# Patient Record
Sex: Female | Born: 1955
Health system: Southern US, Community
[De-identification: ages and names within clinical notes are randomized; demographics above are authoritative.]

## PROBLEM LIST (undated history)

## (undated) DIAGNOSIS — I10 Essential (primary) hypertension: Secondary | ICD-10-CM

## (undated) DIAGNOSIS — T7840XA Allergy, unspecified, initial encounter: Secondary | ICD-10-CM

## (undated) DIAGNOSIS — C50919 Malignant neoplasm of unspecified site of unspecified female breast: Secondary | ICD-10-CM

## (undated) DIAGNOSIS — Q185 Microstomia: Secondary | ICD-10-CM

## (undated) DIAGNOSIS — M1990 Unspecified osteoarthritis, unspecified site: Secondary | ICD-10-CM

## (undated) DIAGNOSIS — I776 Arteritis, unspecified: Secondary | ICD-10-CM

## (undated) DIAGNOSIS — K589 Irritable bowel syndrome without diarrhea: Secondary | ICD-10-CM

## (undated) DIAGNOSIS — K219 Gastro-esophageal reflux disease without esophagitis: Secondary | ICD-10-CM

## (undated) DIAGNOSIS — Z8541 Personal history of malignant neoplasm of cervix uteri: Secondary | ICD-10-CM

## (undated) DIAGNOSIS — Z87898 Personal history of other specified conditions: Secondary | ICD-10-CM

## (undated) DIAGNOSIS — Z98811 Dental restoration status: Secondary | ICD-10-CM

## (undated) DIAGNOSIS — J45909 Unspecified asthma, uncomplicated: Secondary | ICD-10-CM

## (undated) DIAGNOSIS — R198 Other specified symptoms and signs involving the digestive system and abdomen: Secondary | ICD-10-CM

## (undated) DIAGNOSIS — F909 Attention-deficit hyperactivity disorder, unspecified type: Secondary | ICD-10-CM

## (undated) DIAGNOSIS — Z5189 Encounter for other specified aftercare: Secondary | ICD-10-CM

## (undated) DIAGNOSIS — R1310 Dysphagia, unspecified: Secondary | ICD-10-CM

## (undated) DIAGNOSIS — F419 Anxiety disorder, unspecified: Secondary | ICD-10-CM

## (undated) DIAGNOSIS — F988 Other specified behavioral and emotional disorders with onset usually occurring in childhood and adolescence: Secondary | ICD-10-CM

## (undated) DIAGNOSIS — Z853 Personal history of malignant neoplasm of breast: Secondary | ICD-10-CM

## (undated) DIAGNOSIS — E039 Hypothyroidism, unspecified: Secondary | ICD-10-CM

## (undated) HISTORY — DX: Malignant neoplasm of unspecified site of unspecified female breast: C50.919

## (undated) HISTORY — DX: Encounter for other specified aftercare: Z51.89

## (undated) HISTORY — PX: CERVICAL CERCLAGE: SHX1329

## (undated) HISTORY — DX: Unspecified asthma, uncomplicated: J45.909

## (undated) HISTORY — PX: MASTECTOMY: SHX3

## (undated) HISTORY — PX: VEIN SURGERY: SHX48

## (undated) HISTORY — PX: BRAIN SURGERY: SHX531

## (undated) HISTORY — DX: Allergy, unspecified, initial encounter: T78.40XA

## (undated) HISTORY — PX: FACIAL COSMETIC SURGERY: SHX629

## (undated) HISTORY — DX: Arteritis, unspecified: I77.6

## (undated) HISTORY — PX: ABDOMINAL HYSTERECTOMY: SHX81

## (undated) HISTORY — PX: BREAST ENHANCEMENT SURGERY: SHX7

---

## 1996-12-23 HISTORY — PX: GYNECOLOGIC CRYOSURGERY: SHX857

## 1998-04-28 ENCOUNTER — Ambulatory Visit (HOSPITAL_COMMUNITY): Admission: RE | Admit: 1998-04-28 | Discharge: 1998-04-28 | Payer: Self-pay | Admitting: Obstetrics & Gynecology

## 1999-03-16 ENCOUNTER — Ambulatory Visit (HOSPITAL_COMMUNITY): Admission: RE | Admit: 1999-03-16 | Discharge: 1999-03-16 | Payer: Self-pay | Admitting: Obstetrics & Gynecology

## 1999-04-09 ENCOUNTER — Encounter: Admission: RE | Admit: 1999-04-09 | Discharge: 1999-05-01 | Payer: Self-pay | Admitting: Orthopedic Surgery

## 2000-03-04 ENCOUNTER — Encounter: Admission: RE | Admit: 2000-03-04 | Discharge: 2000-03-04 | Payer: Self-pay | Admitting: Family Medicine

## 2000-03-04 ENCOUNTER — Encounter: Payer: Self-pay | Admitting: Family Medicine

## 2000-03-18 ENCOUNTER — Encounter: Payer: Self-pay | Admitting: Obstetrics & Gynecology

## 2000-03-18 ENCOUNTER — Ambulatory Visit (HOSPITAL_COMMUNITY): Admission: RE | Admit: 2000-03-18 | Discharge: 2000-03-18 | Payer: Self-pay | Admitting: Obstetrics & Gynecology

## 2000-04-17 ENCOUNTER — Encounter: Admission: RE | Admit: 2000-04-17 | Discharge: 2000-04-17 | Payer: Self-pay | Admitting: Neurology

## 2000-04-17 ENCOUNTER — Encounter: Payer: Self-pay | Admitting: Neurology

## 2000-04-21 ENCOUNTER — Other Ambulatory Visit: Admission: RE | Admit: 2000-04-21 | Discharge: 2000-04-21 | Payer: Self-pay | Admitting: Obstetrics & Gynecology

## 2000-09-10 ENCOUNTER — Encounter: Payer: Self-pay | Admitting: Internal Medicine

## 2000-09-10 ENCOUNTER — Encounter: Admission: RE | Admit: 2000-09-10 | Discharge: 2000-09-10 | Payer: Self-pay | Admitting: Internal Medicine

## 2000-09-25 ENCOUNTER — Emergency Department (HOSPITAL_COMMUNITY): Admission: EM | Admit: 2000-09-25 | Discharge: 2000-09-25 | Payer: Self-pay | Admitting: Emergency Medicine

## 2000-09-26 ENCOUNTER — Encounter: Payer: Self-pay | Admitting: Internal Medicine

## 2000-09-26 ENCOUNTER — Encounter: Admission: RE | Admit: 2000-09-26 | Discharge: 2000-09-26 | Payer: Self-pay | Admitting: Internal Medicine

## 2001-03-23 ENCOUNTER — Ambulatory Visit (HOSPITAL_COMMUNITY): Admission: RE | Admit: 2001-03-23 | Discharge: 2001-03-23 | Payer: Self-pay | Admitting: Obstetrics & Gynecology

## 2001-03-23 ENCOUNTER — Encounter: Payer: Self-pay | Admitting: Obstetrics & Gynecology

## 2001-05-20 ENCOUNTER — Other Ambulatory Visit: Admission: RE | Admit: 2001-05-20 | Discharge: 2001-05-20 | Payer: Self-pay | Admitting: Obstetrics & Gynecology

## 2002-04-12 ENCOUNTER — Encounter: Payer: Self-pay | Admitting: Obstetrics & Gynecology

## 2002-04-12 ENCOUNTER — Ambulatory Visit (HOSPITAL_COMMUNITY): Admission: RE | Admit: 2002-04-12 | Discharge: 2002-04-12 | Payer: Self-pay | Admitting: Obstetrics & Gynecology

## 2002-06-14 ENCOUNTER — Other Ambulatory Visit: Admission: RE | Admit: 2002-06-14 | Discharge: 2002-06-14 | Payer: Self-pay | Admitting: Obstetrics & Gynecology

## 2002-06-22 ENCOUNTER — Encounter: Admission: RE | Admit: 2002-06-22 | Discharge: 2002-06-22 | Payer: Self-pay | Admitting: Gastroenterology

## 2002-06-22 ENCOUNTER — Encounter: Payer: Self-pay | Admitting: Gastroenterology

## 2002-07-08 ENCOUNTER — Ambulatory Visit (HOSPITAL_COMMUNITY): Admission: RE | Admit: 2002-07-08 | Discharge: 2002-07-08 | Payer: Self-pay | Admitting: Internal Medicine

## 2002-07-08 ENCOUNTER — Encounter: Payer: Self-pay | Admitting: Internal Medicine

## 2002-12-30 ENCOUNTER — Encounter: Admission: RE | Admit: 2002-12-30 | Discharge: 2002-12-30 | Payer: Self-pay | Admitting: Family Medicine

## 2002-12-30 ENCOUNTER — Encounter: Payer: Self-pay | Admitting: Obstetrics & Gynecology

## 2003-05-12 ENCOUNTER — Encounter (INDEPENDENT_AMBULATORY_CARE_PROVIDER_SITE_OTHER): Payer: Self-pay | Admitting: Specialist

## 2003-05-12 ENCOUNTER — Ambulatory Visit (HOSPITAL_COMMUNITY): Admission: RE | Admit: 2003-05-12 | Discharge: 2003-05-12 | Payer: Self-pay | Admitting: Obstetrics & Gynecology

## 2003-05-12 ENCOUNTER — Encounter (INDEPENDENT_AMBULATORY_CARE_PROVIDER_SITE_OTHER): Payer: Self-pay

## 2003-05-12 HISTORY — PX: ENDOMETRIAL FULGURATION: SHX1500

## 2003-05-12 HISTORY — PX: DIAGNOSTIC LAPAROSCOPY: SUR761

## 2003-05-12 HISTORY — PX: RADIOFREQUENCY ABLATION NERVES: SUR1070

## 2003-08-01 ENCOUNTER — Other Ambulatory Visit: Admission: RE | Admit: 2003-08-01 | Discharge: 2003-08-01 | Payer: Self-pay | Admitting: Obstetrics & Gynecology

## 2003-10-07 ENCOUNTER — Ambulatory Visit: Admission: RE | Admit: 2003-10-07 | Discharge: 2003-10-07 | Payer: Self-pay | Admitting: Internal Medicine

## 2003-10-07 ENCOUNTER — Encounter: Payer: Self-pay | Admitting: Internal Medicine

## 2003-10-27 ENCOUNTER — Encounter: Admission: RE | Admit: 2003-10-27 | Discharge: 2003-10-27 | Payer: Self-pay | Admitting: Chiropractic Medicine

## 2003-11-01 ENCOUNTER — Other Ambulatory Visit: Admission: RE | Admit: 2003-11-01 | Discharge: 2003-11-01 | Payer: Self-pay | Admitting: Obstetrics & Gynecology

## 2003-12-24 HISTORY — PX: DILATION AND CURETTAGE OF UTERUS: SHX78

## 2004-02-14 ENCOUNTER — Ambulatory Visit (HOSPITAL_COMMUNITY): Admission: RE | Admit: 2004-02-14 | Discharge: 2004-02-14 | Payer: Self-pay | Admitting: Obstetrics & Gynecology

## 2004-03-28 ENCOUNTER — Encounter (INDEPENDENT_AMBULATORY_CARE_PROVIDER_SITE_OTHER): Payer: Self-pay | Admitting: Specialist

## 2004-03-28 ENCOUNTER — Inpatient Hospital Stay (HOSPITAL_COMMUNITY): Admission: RE | Admit: 2004-03-28 | Discharge: 2004-03-31 | Payer: Self-pay | Admitting: Obstetrics & Gynecology

## 2004-03-28 HISTORY — PX: LYSIS OF ADHESION: SHX5961

## 2004-03-28 HISTORY — PX: TOTAL ABDOMINAL HYSTERECTOMY W/ BILATERAL SALPINGOOPHORECTOMY: SHX83

## 2004-03-28 HISTORY — PX: LIPECTOMY: SHX11

## 2004-03-28 HISTORY — PX: ABDOMINOPLASTY: SUR9

## 2004-05-17 ENCOUNTER — Ambulatory Visit (HOSPITAL_COMMUNITY): Admission: RE | Admit: 2004-05-17 | Discharge: 2004-05-17 | Payer: Self-pay | Admitting: Obstetrics & Gynecology

## 2004-12-16 ENCOUNTER — Emergency Department (HOSPITAL_COMMUNITY): Admission: EM | Admit: 2004-12-16 | Discharge: 2004-12-16 | Payer: Self-pay | Admitting: Emergency Medicine

## 2004-12-28 ENCOUNTER — Ambulatory Visit: Payer: Self-pay | Admitting: Gastroenterology

## 2005-04-22 ENCOUNTER — Other Ambulatory Visit: Admission: RE | Admit: 2005-04-22 | Discharge: 2005-04-22 | Payer: Self-pay | Admitting: Obstetrics & Gynecology

## 2005-05-14 ENCOUNTER — Encounter: Admission: RE | Admit: 2005-05-14 | Discharge: 2005-05-14 | Payer: Self-pay | Admitting: Internal Medicine

## 2005-05-17 ENCOUNTER — Ambulatory Visit (HOSPITAL_COMMUNITY): Admission: RE | Admit: 2005-05-17 | Discharge: 2005-05-17 | Payer: Self-pay | Admitting: Obstetrics & Gynecology

## 2005-05-21 ENCOUNTER — Encounter: Admission: RE | Admit: 2005-05-21 | Discharge: 2005-05-21 | Payer: Self-pay | Admitting: Obstetrics & Gynecology

## 2005-09-19 ENCOUNTER — Encounter: Admission: RE | Admit: 2005-09-19 | Discharge: 2005-09-19 | Payer: Self-pay | Admitting: Interventional Radiology

## 2005-09-26 ENCOUNTER — Encounter: Admission: RE | Admit: 2005-09-26 | Discharge: 2005-09-26 | Payer: Self-pay | Admitting: Internal Medicine

## 2005-10-17 ENCOUNTER — Encounter: Admission: RE | Admit: 2005-10-17 | Discharge: 2005-10-17 | Payer: Self-pay | Admitting: Internal Medicine

## 2005-10-31 ENCOUNTER — Encounter: Admission: RE | Admit: 2005-10-31 | Discharge: 2005-10-31 | Payer: Self-pay | Admitting: Internal Medicine

## 2005-11-13 ENCOUNTER — Encounter: Admission: RE | Admit: 2005-11-13 | Discharge: 2005-11-13 | Payer: Self-pay | Admitting: Internal Medicine

## 2005-11-21 ENCOUNTER — Encounter: Admission: RE | Admit: 2005-11-21 | Discharge: 2005-11-21 | Payer: Self-pay | Admitting: Specialist

## 2005-11-28 ENCOUNTER — Encounter: Admission: RE | Admit: 2005-11-28 | Discharge: 2005-11-28 | Payer: Self-pay | Admitting: Internal Medicine

## 2006-01-28 ENCOUNTER — Encounter: Admission: RE | Admit: 2006-01-28 | Discharge: 2006-01-28 | Payer: Self-pay | Admitting: Internal Medicine

## 2006-03-10 ENCOUNTER — Encounter (INDEPENDENT_AMBULATORY_CARE_PROVIDER_SITE_OTHER): Payer: Self-pay | Admitting: *Deleted

## 2006-03-10 ENCOUNTER — Ambulatory Visit (HOSPITAL_COMMUNITY): Admission: RE | Admit: 2006-03-10 | Discharge: 2006-03-10 | Payer: Self-pay | Admitting: Gastroenterology

## 2006-03-11 ENCOUNTER — Encounter: Admission: RE | Admit: 2006-03-11 | Discharge: 2006-03-11 | Payer: Self-pay | Admitting: Internal Medicine

## 2006-05-10 ENCOUNTER — Emergency Department (HOSPITAL_COMMUNITY): Admission: EM | Admit: 2006-05-10 | Discharge: 2006-05-10 | Payer: Self-pay | Admitting: Family Medicine

## 2006-10-15 ENCOUNTER — Encounter: Admission: RE | Admit: 2006-10-15 | Discharge: 2006-10-15 | Payer: Self-pay | Admitting: Obstetrics & Gynecology

## 2007-10-19 ENCOUNTER — Encounter: Admission: RE | Admit: 2007-10-19 | Discharge: 2007-10-19 | Payer: Self-pay | Admitting: Obstetrics & Gynecology

## 2007-11-01 ENCOUNTER — Encounter: Admission: RE | Admit: 2007-11-01 | Discharge: 2007-11-01 | Payer: Self-pay | Admitting: Obstetrics & Gynecology

## 2007-11-15 ENCOUNTER — Encounter: Admission: RE | Admit: 2007-11-15 | Discharge: 2007-11-15 | Payer: Self-pay | Admitting: Obstetrics & Gynecology

## 2008-03-04 ENCOUNTER — Ambulatory Visit: Payer: Self-pay | Admitting: Infectious Diseases

## 2008-03-22 ENCOUNTER — Emergency Department (HOSPITAL_COMMUNITY): Admission: EM | Admit: 2008-03-22 | Discharge: 2008-03-22 | Payer: Self-pay | Admitting: Family Medicine

## 2008-10-19 ENCOUNTER — Encounter: Admission: RE | Admit: 2008-10-19 | Discharge: 2008-10-19 | Payer: Self-pay | Admitting: Obstetrics & Gynecology

## 2008-12-19 ENCOUNTER — Encounter: Admission: RE | Admit: 2008-12-19 | Discharge: 2008-12-19 | Payer: Self-pay | Admitting: Gastroenterology

## 2009-04-27 ENCOUNTER — Emergency Department (HOSPITAL_COMMUNITY): Admission: EM | Admit: 2009-04-27 | Discharge: 2009-04-27 | Payer: Self-pay | Admitting: Emergency Medicine

## 2009-07-15 ENCOUNTER — Emergency Department (HOSPITAL_COMMUNITY): Admission: EM | Admit: 2009-07-15 | Discharge: 2009-07-15 | Payer: Self-pay | Admitting: Family Medicine

## 2009-09-06 DIAGNOSIS — Z87898 Personal history of other specified conditions: Secondary | ICD-10-CM | POA: Insufficient documentation

## 2009-09-06 DIAGNOSIS — I868 Varicose veins of other specified sites: Secondary | ICD-10-CM | POA: Insufficient documentation

## 2009-09-06 DIAGNOSIS — C539 Malignant neoplasm of cervix uteri, unspecified: Secondary | ICD-10-CM | POA: Insufficient documentation

## 2009-09-06 DIAGNOSIS — I8000 Phlebitis and thrombophlebitis of superficial vessels of unspecified lower extremity: Secondary | ICD-10-CM | POA: Insufficient documentation

## 2009-10-03 ENCOUNTER — Emergency Department (HOSPITAL_COMMUNITY): Admission: EM | Admit: 2009-10-03 | Discharge: 2009-10-03 | Payer: Self-pay | Admitting: Family Medicine

## 2009-10-20 ENCOUNTER — Encounter: Admission: RE | Admit: 2009-10-20 | Discharge: 2009-10-20 | Payer: Self-pay | Admitting: Obstetrics & Gynecology

## 2009-11-14 ENCOUNTER — Encounter: Admission: RE | Admit: 2009-11-14 | Discharge: 2009-11-14 | Payer: Self-pay | Admitting: Family Medicine

## 2010-08-07 ENCOUNTER — Emergency Department (HOSPITAL_COMMUNITY): Admission: EM | Admit: 2010-08-07 | Discharge: 2010-08-07 | Payer: Self-pay | Admitting: Family Medicine

## 2010-10-30 ENCOUNTER — Ambulatory Visit (HOSPITAL_COMMUNITY): Admission: RE | Admit: 2010-10-30 | Discharge: 2010-10-30 | Payer: Self-pay | Admitting: Gastroenterology

## 2010-11-07 ENCOUNTER — Encounter: Admission: RE | Admit: 2010-11-07 | Discharge: 2010-11-07 | Payer: Self-pay | Admitting: Obstetrics & Gynecology

## 2011-01-12 ENCOUNTER — Encounter: Payer: Self-pay | Admitting: Internal Medicine

## 2011-05-10 NOTE — H&P (Signed)
NAME:  Jane Gutierrez, PADIN                             ACCOUNT NO.:  [id number]   MEDICAL RECORD NO.:  [id number]                   PATIENT TYPE:  INP   LOCATION:  NA                                   FACILITY:  [facility]   PHYSICIAN:  Freddy Finner, M.D.                DATE OF BIRTH:  [birth year]   DATE OF ADMISSION:  03/28/2004  DATE OF DISCHARGE:                                HISTORY & PHYSICAL   ADMISSION DIAGNOSES:  1. Uterine enlargement consistent clinically with adenomyosis.  2. Known previous pelvic endometriosis.  3. Known pelvic adhesions.  4. Recent chronic history of dysfunctional uterine bleeding unresponsive to     Yasmin, which is an oral contraceptive.   The patient is a 55 year old white divorced female, gravida 2, para 1, who  has significant previous GYN and obstetrical history.  She had one viable 37-  week pregnancy which she delivered.  She had a second pregnancy loss in the  second trimester due to incompetent cervix.  She has previously had  laparoscopy on at least three different occasions for pelvic pain with  findings of pelvic endometriosis, adhesions, uterine enlargement.  She has  also had history of cervical dysplasia with the worst lesion being CIS.  Her  recent Pap smear does show some atypical glandular cells but otherwise is  normal.  She has previously had surgical sterilization.  She has requested  definitive surgical intervention at this time and is admitted now for total  abdominal hysterectomy.   Her current review of systems is otherwise negative.  There are no  cardiopulmonary, GI, or GU complaints.   PAST MEDICAL HISTORY:  The patient is known to have had superficial  phlebitis.  She has had previous vein stripping for varicosities.  She has  additional problems as noted above.  She is known to have hypothyroidism,  for which she takes Synthroid 100 mcg a day.  She takes one-half of a 2 mg  Bumex per day.  She takes a daily aspirin.   She has  significant known allergies to IODINE, BETADINE, PENICILLIN,  AMOXICILLIN, CLEOCIN, and SEPTRA.   She has previously had surgical procedures as noted above.  She has also had  a previous breast biopsy for benign breast disease.  She has never had a  blood transfusion.   She is not a cigarette smoker.   Family history is noncontributory.   PHYSICAL EXAMINATION:  VITAL SIGNS:  Blood pressure in the office is 92/60.  Weight 166, height 5 feet 6 inches.  HEENT:  Grossly within normal limits.  NECK:  Thyroid gland is not palpably enlarged to my examination.  CHEST:  Clear to auscultation throughout.  CARDIAC:  Normal sinus rhythm without murmurs, rubs, or gallops.  BREASTS:  Breast exam is considered to be normal at this time, no palpable  masses, no nipple discharge, no skin change.  ABDOMEN:  Soft, nontender, without appreciable organomegaly or palpable  masses.  PELVIC:  External genitalia, vagina, and cervix are normal.  Bimanual  reveals the uterus to be anterior in position, slightly increased in size,  and mildly tender.  There are no palpable adnexal masses.  The rectum is  palpably normal.  Rectovaginal exam confirms the above findings.   ASSESSMENT:  1. Probable adenomyosis.  2. Known previous pelvic endometriosis and pelvic adhesions.  3. Chronic clinical problems of dysmenorrhea, dysfunctional uterine     bleeding.   PLAN:  Total abdominal hysterectomy.  The procedure will be followed by an  abdominoplasty with W. Delia Chimes, M.D.  The patient will receive IV  antibiotic preoperatively.  She will be placed in PAS compression hose.                                               Freddy Finner, M.D.    WRN/MEDQ  D:  03/27/2004  T:  03/28/2004  Job:  [id number]

## 2011-05-10 NOTE — Op Note (Signed)
[name], Jane Gutierrez                   ACCOUNT NO.:  [id number]   MEDICAL RECORD NO.:  [id number]          PATIENT TYPE:  AMB   LOCATION:  ENDO                         FACILITY:  [facility]   PHYSICIAN:  Anselmo Rod, M.D.  DATE OF BIRTH:  08-28-1956   DATE OF PROCEDURE:  03/12/2006  DATE OF DISCHARGE:  03/10/2006                                 OPERATIVE REPORT   PROCEDURE PERFORMED:  Colonoscopy with cold biopsies.   ENDOSCOPIST:  Anselmo Rod, M.D.   INSTRUMENT USED:  Olympus video colonoscope.   INDICATIONS FOR PROCEDURE:  The patient is a 55 year old white female with a  personal history of cervical cancer and change in bowel habits undergoing  colonoscopy. Rule out colonic polyps, masses, etc. Random colon biopsy is  planned to rule out collagenous colitis.   PREPROCEDURE PREPARATION:  Informed consent was procured from the patient.  The patient was fasted for four hours prior to the procedure and prepped  with OsmoPrep pills the night of and the morning of the procedure.  The  risks and benefits of the procedure including a 10% miss rate for cancer or  polyps was discussed with the patient as well.   PREPROCEDURE PHYSICAL:  The patient had stable vital signs.  Neck supple.  Chest clear to auscultation.  S1 and S2 regular.  Abdomen soft with normal  bowel sounds.   DESCRIPTION OF PROCEDURE:  The patient was placed in left lateral decubitus  position and sedated with 100 mcg of fentanyl and 10 mg of Versed in slow  incremental doses.  Once the patient was adequately sedated and maintained  on low flow oxygen and continuous cardiac monitoring, the Olympus video  colonoscope was advanced from the rectum to the cecum.  A small sessile  polyp was biopsied from the cecum.  A small external hemorrhoid was seen on  inspection with internal hemorrhoids seen on retroflexion.  Random colon  biopsies were done to rule out collagenous versus microscopic colitis.  There was no evidence  of diverticulosis.  The patient tolerated the  procedure well without immediate complication.   IMPRESSION:  1.  Small external hemorrhoid.  2.  Small internal hemorrhoids.  3.  Small sessile polyp removed by cold biopsy from the cecum.  4.  Random colon biopsies done to evaluate for collagenous versus      microscopic colitis.  5.  Question of visceral hypersensitivity.  Patient experienced significant      abdominal pain with insufflation of air into the colon indicating a      component of irritable bowel syndrome.   RECOMMENDATIONS:  1.  Await pathology results.  2.  Continue high fiber diet with liberal fluid intake and use hyoscyamine      as needed.  3.  Outpatient followup as need arises in the future.  4.  Repeat colonoscopy depending on pathology results.      Anselmo Rod, M.D.  Electronically Signed     JNM/MEDQ  D:  03/12/2006  T:  03/13/2006  Job:  [id number]   cc:   W.  Varney Baas, M.D.  Fax: [phone]   Lucky Cowboy, M.D.  Fax: [phone]

## 2011-05-10 NOTE — Op Note (Signed)
NAME:  Jane Gutierrez, Jane Gutierrez                             ACCOUNT NO.:  [id number]   MEDICAL RECORD NO.:  [id number]                   PATIENT TYPE:  INP   LOCATION:  9316                                 FACILITY:  [facility]   PHYSICIAN:  Freddy Finner, M.D.                DATE OF BIRTH:  17-Dec-1956   DATE OF PROCEDURE:  03/28/2004  DATE OF DISCHARGE:                                 OPERATIVE REPORT   PREOPERATIVE DIAGNOSES:  Suspected adenomyosis, uterine enlargement, history  of chronic pelvic pain, history of pelvic endometriosis, history of pelvic  adhesions.   POSTOPERATIVE DIAGNOSES:  Suspected adenomyosis, uterine enlargement,  history of chronic pelvic pain, history of pelvic endometriosis, history of  pelvic adhesions with no active evidence of endometriosis with adhesions of  the left adnexa.   OPERATIVE PROCEDURE:  Total abdominal hysterectomy, lysis of pelvic  adhesions, bilateral salpingo-oophorectomy.   SURGEON:  Freddy Finner, M.D.   ASSISTANT:  Juluis Mire, M.D.   SECONDARY PROCEDURE PERFORMED AFTER COMPLETION OF HYSTERECTOMY:  Abdominoplasty and liposuction per Dr. Benna Dunks.   ANESTHESIA:  General endotracheal.   ESTIMATED INTRAOPERATIVE BLOOD LOSS DURING THE GYN PROCEDURE:  100 cc.   INTRAOPERATIVE COMPLICATIONS:  None.   The patient was admitted on the morning of surgery. She was placed in  _________ compression hose. She was given a gram of Mefoxin preoperatively.  She was taken to the operating room, placed under adequate general  endotracheal anesthesia. The abdomen, perineum, and vagina were prepped in a  standard fashion for both procedures to be performed using Hibiclens. The  patient is allergic to BETADINE.  A Foley catheter was placed using sterile  technique. A vaginal prep was carried out with Hibiclens. Sterile drapes  were applied. A low abdominal transverse incision was made through a marked  line in the skin placed by Dr. Benna Dunks. The incision was  carried sharply down  through fascia. Bleeding  subcutaneous  fascia was controlled with the  Bovie. The fascia was entered sharply and extended transversely to the  extent of skin incisions. A rectus sheath was developed superiorly and  inferiorly using blunt and sharp dissection. The rectus muscle was divided  in the midline. Peritoneum was entered bluntly and extended bluntly sharply  to the extent of skin incision. Palpation of the upper abdomen revealed no  apparent abnormality of the liver, gallbladder, and kidneys. The appendix  was visualized and was normal.  A self-retaining Andrey Spearman retractor  was placed. Moist packs were used to pack the intestinal contents out of the  pelvis. Proximal broad ligaments were clamped with Kelly clamps. Round  ligaments were taken with suture ligature of 0 Monocryl on each side and  this was tied and the ligament divided.  Infundibulopelvic ligament was  developed on each side, cross clamped with curved Heaney, divided, and  doubly ligated with free tie of 0 Monocryl,  followed by suture ligature of 0  Monocryl. Vessel pedicles were skeletonized and the peritoneum was incised  transversely to release the bladder from the cervix and the lower uterine  segment. Vessel pedicles were taken with curved Heaney's, divided sharply,  and ligated with 0 Monocryl. Straight Heaneys were placed in the cardinal  ligament. Pedicles were divided sharply and ligated with 0 Monocryl in a  Heaney fashion. Curved Heaneys were placed in the uterosacral pedicles. This  included the angle of the vagina on the left. The pedicles were sharply  divided, suture ligated with 0 Vicryl in a Heaney fashion. The angle of the  vagina was tucked in on the right after securing the uterosacral ligament.  This was anchored to the uterosacral. The cervix was completely excised and  inspection revealed adequate removal of the cervix. The remaining vaginal  cuff opening was small  and this was closed with a single finger-of-eight of  0 Vicryl. A small amount of bleeding sources in the cuff were controlled  with the Bovie. Uterosacrals were plicated with 0 Monocryl in the peritoneum  anterior to the cuff and was pulled over the cuff using the same suture.  Irrigation was carried out. Hemostasis was complete. All packs, needles, and  instruments were removed. Counts were correct. The peritoneum was then  closed with a running 0 Monocryl suture. The fascia was closed with running  0 PDS running from angle to angle on either side. The closure was then  stopped in anticipation of Dr. Derek Jack procedure. The procedure was turned  over to him and he will dictate a further operative note.                                               Freddy Finner, M.D.    WRN/MEDQ  D:  03/28/2004  T:  03/29/2004  Job:  [id number]

## 2011-05-10 NOTE — Op Note (Signed)
NAME:  Jane Gutierrez, Jane Gutierrez                             ACCOUNT NO.:  [id number]   MEDICAL RECORD NO.:  [id number]                   PATIENT TYPE:  INP   LOCATION:  9316                                 FACILITY:  [facility]   PHYSICIAN:  Alfredia Ferguson, M.D.               DATE OF BIRTH:  1956/03/03   DATE OF PROCEDURE:  03/28/2004  DATE OF DISCHARGE:                                 OPERATIVE REPORT   PREOPERATIVE DIAGNOSIS:  Excess skin of lower abdomen with excess skin in  bilateral hip area.   POSTOPERATIVE DIAGNOSIS:  Excess skin of lower abdomen with excess skin in  bilateral hip area.   OPERATION PERFORMED:  1. Abdominoplasty.  2. Suction assisted lipectomy, bilateral hips.   SURGEON:  Alfredia Ferguson, M.D.   FIRST ASSISTANT:  Vevelyn Francois, RNFA   ANESTHESIA:  General endotracheal anesthesia.   INDICATIONS FOR PROCEDURE:  This is a 55 year old woman who is undergoing  elective hysterectomy.  She wishes to have an abdominoplasty at the same  time.  She has some prominent fat in the hip area.  She would like to have  that area reduced in size using liposuction.  The potential risks of the  surgery were discussed in detail with the patient including the risks of  over resection of skin, under resection of skin, hematoma, seroma,  infection, unsightly scarring, vascular compromise to the umbilicus,  asymmetry of the scar, prolonged drainage, and overall dissatisfaction of  the results.  In addition, the risks of liposuction including asymmetry,  rippling, dimpling, wrinkling of the skin, hematoma, seroma, prolonged  edema, and the need for secondary liposuction was discussed.  In spite of  these risks, the patient wishes to proceed with the surgery.   DESCRIPTION OF PROCEDURE:  On the day prior to surgery, skin marks were  placed outlining the dimensions of the skin excision for the tummy tuck and  also the areas of liposuction in the hip area.  Following completion of the  hysterectomy, I was summoned to the operating room.  Attention was first  directed to the lower abdomen where the hysterectomy incision was lengthened  from one side of the abdomen to the other.  A circular incision was made  around the umbilicus and the umbilical stalk was dissected away from the  skin and subcutaneous tissue of the abdominal flap leaving a healthy cuff of  fat to insure vascular integrity.  The abdominal flap was elevated to the  umbilical stalk and then split in the midline to facilitate dissection.  The  flap was dissected up to the costal margins bilaterally and the xiphoid in  the midline.  Hemostasis was meticulously maintained.  The anterior rectus  fascia was plicated beginning in the subxiphoid area using interrupted 0  Prolene figure-of-eight suturing.  The plication was carried out from the  xiphoid to the suprapubic area with care taken  in the periumbilical area not  to strangulate the umbilical stalk.  The abdominal wound was copiously  irrigated with saline irrigation.  The 10 mm Blake drain was placed in the  wound and brought out through a separate stab incision.   I now infiltrated tumescent solution in both hip areas.  The tumescent  solution consisted of 1 liter of ringers lactate plus 1 ampule of 1:100,000  epinephrine plus 20 mL of 1% Xylocaine plain.  Approximately 500 mL was  infiltrated on each side.  While waiting for the tumescent solution to set  up, the patient's back was elevated to 30 degrees and the knees were flexed.  The excess skin was pulled in an inferior direction and marked.  The excess  skin was excised with care taken to bring about meticulous hemostasis.  The  midline of the wound was closed using interrupted 2-0 Vicryl sutures.  The  remainder of the incision was closed alternating 2-0 Vicryl sutures for the  dermis and 3-0 Monocryl sutures for the dermis.  A running subcuticular 3-0  Monocryl was then placed.  A new opening for  the umbilicus was previously  marked and now incised.  The umbilicus was brought through this new opening  and fixed in the new position using multiple interrupted 4-0 Vicryl sutures.  Liposuction was now carried out using a 4 mm cannula using the power  assisted liposuction machine.  Liposuction was also carried out through a  separate stab incision in order to use the cannula in varying directions.  Approximately 175 grams were removed from each hip area.  There was almost  no bleeding.  Symmetry was good at the conclusion of the procedure.  Each  access site was closed with an interrupted 5-0 nylon suture.  The patient  tolerated the procedure well with an estimated blood loss of less than 100  mL.  The patient's chest and abdomen were cleansed, dried, and Steri-Strips  were applied to the incision.  A bulky dressing was placed over the  abdominal incision followed by an abdominal binder.  The patient was  awakened, extubated, and transported to the recovery room in satisfactory  condition.                                               Alfredia Ferguson, M.D.    WBB/MEDQ  D:  03/28/2004  T:  03/28/2004  Job:  [id number]

## 2011-05-10 NOTE — Op Note (Signed)
NAME:  Jane Gutierrez, Jane Gutierrez                             ACCOUNT NO.:  [id number]   MEDICAL RECORD NO.:  [id number]                   PATIENT TYPE:  AMB   LOCATION:  SDC                                  FACILITY:  [facility]   PHYSICIAN:  Freddy Finner, M.D.                DATE OF BIRTH:  August 28, 1956   DATE OF PROCEDURE:  05/12/2003  DATE OF DISCHARGE:                                 OPERATIVE REPORT   PREOPERATIVE DIAGNOSES:  1. Chronic recurrent pelvic pain.  2. Known history of pelvic endometriosis.  3. Known history of uterine enlargement.   POSTOPERATIVE DIAGNOSES:  1. Recurrent pelvic endometriosis.  2. Pelvic adhesions.  3. Hydrosalpinx of tubal remnant on the right.  4. Boggy enlargement of the uterus most consistent with adenomyosis.   OPERATIVE PROCEDURES:  1. Laparoscopy.  2. Fulguration of recurrent evidence of endometriosis.  3. Uterosacral nerve ablation.  4. Partial salpingectomy of right fallopian tube.  5. Lysis of paraovarian adhesions on the left.  6. Aspiration of cyst fluid of left ovarian cyst.   ANESTHESIA:  General.   INTRAOPERATIVE COMPLICATIONS:  None.   ESTIMATED INTRAOPERATIVE BLOOD LOSS:  5 mL.   Details of the present illness are recorded in the admission note.  The  patient is a 55 year old known to have pelvic endometriosis with diagnosis  in 1999.  She is now having recurrent symptoms consistent with  endometriosis.   The findings are as noted in the postoperative diagnoses.  The findings are  recorded in still photographs retained in the office record.   DESCRIPTION OF PROCEDURE:  The patient was admitted on the morning of  surgery, brought to the operating room, placed under adequate general  anesthesia, and placed in the dorsolithotomy position using the Linden  stirrup system.  Hibiclens was used for the prep because of iodine allergy.  This was carried out in the usual fashion.  Blood was evacuated with a  Robinson catheter.  The Hulka tenaculum  was attached to the cervix under  direct visualization.  Sterile drapes were applied.  Two small incisions  were made, one through an old scar at the umbilicus and one just above the  symphysis.  An 11 mm nonbladed disposable trocar was introduced at the  umbilicus.  Direction inspection revealed an adequate placement with no  evidence of injury on entry.  Pneumoperitoneum was allowed to accumulate.  A  5 mm trocar was placed in the lower incision through the blunt probe.  Later, grasping forceps and Nezhat irrigation system were used through this  port.  Systematic examination of pelvic and abdominal contents was carried  out and photographs made.  Using the bipolar forceps through the operating  channel of the laparoscope, all visible evidence of pelvic endometriosis was  ablated, including lesions on the left ovary.  Blunt dissection was required  to free the left ovary  from the left pelvic sidewall.  It was cystically  enlarged.  For that reason, a needle trocar was introduced.  Aspiration  contained dark bloody material possibly consistent with endometrioma versus  hemorrhage cyst.  The fluid was submitted for examination.  The mid portion  of the right fallopian tube was cystically dilated with apparent occlusion  at both ends of the tube with minimal fimbria remaining distal to this  dilated segment.  The dilated segment was dissected free after fulguration  with the bipolar forceps and retrieved through the operating channel of the  laparoscope.  Uterosacral ligaments were fulgurated at their junction with  the cervix using bipolar forceps.  A pseudowindow in the peritoneal cul-de-  sac on the right was fulgurated along its entire circumference.  Scattered  lesions, one on the uterus goes along the peritoneal underlying the left  ovary, those on the inferior surface of the left ovary, and a lesion on the  anterior bladder fold, were fulgurated with bipolar forceps.  Some of these   were clearly active lesions and some possibly char from previous procedure,  but were treated anyway.  With adequate hemostasis, the procedure was  terminated after irrigated was carried out.  All irrigating solution was  aspirated from the abdomen.  The instruments were removed after gas was  allowed to escape.  Skin incisions were closed with interrupted subcutaneous  sutures of 3-0 Dexon.  Plain Marcaine 0.25% was injected into the incision  sites for postoperative analgesia.  Steri-Strips were applied in the lower  incision.                                               Freddy Finner, M.D.    WRN/MEDQ  D:  05/12/2003  T:  05/12/2003  Job:  [id number]

## 2011-05-10 NOTE — Discharge Summary (Signed)
Jane Gutierrez, Jane Gutierrez                             ACCOUNT NO.:  [id number]   MEDICAL RECORD NO.:  [id number]                   PATIENT TYPE:  INP   LOCATION:  9316                                 FACILITY:  [facility]   PHYSICIAN:  Freddy Finner, M.D.                DATE OF BIRTH:  April 08, 1956   DATE OF ADMISSION:  03/28/2004  DATE OF DISCHARGE:  03/31/2004                                 DISCHARGE SUMMARY   DISCHARGE DIAGNOSES:  1. Uterine enlargement consistent with adenomyosis, confirmed     histologically.  2. Known pelvic endometriosis.  3. Known pelvic adhesions.  4. Chronic history of dysfunction bleeding unresponsive to oral     contraceptives.  5. Plastic surgery diagnosis of excessive skin over lower abdomen and     excessive skin in bilateral hip area.   OPERATIVE PROCEDURE:  Total abdominal hysterectomy, bilateral salpingo-  oophorectomy, and lysis of pelvic adhesions, followed by abdominoplasty and  suction-assisted lipectomy, bilateral hips.   POSTOPERATIVE COMPLICATIONS:  Fever elevation.   OTHER POSTOPERATIVE COMPLICATIONS:  None.   Details of the present illness are recorded in the admission note and in the  operative summary.   Physical findings on admission were remarkable for the above abdominal and  hip findings per Dr. Benna Dunks and for the enlargement of the uterus and  certainly her clinical history was of significance.   Laboratory data during this admission included admission hemoglobin of 13.4,  postoperative hemoglobin 11.8, with a white count of 10.3 on postoperative  day #1; hemoglobin 11.2 and white count of 7.7 on postoperative day #2.  Prothrombin time and PTT were normal.  Admission chemistries were normal.  Urinalysis on admission was normal.   HOSPITAL COURSE:  The patient was admitted on the morning of surgery.  She  was treated perioperatively with IV antibiotic and with PAS compression  hose.  She was taken to the operating room where the  above-described  surgical procedures were accomplished.  Her course postoperatively was  without major complications.  She did have a temperature elevation on the  first postoperative evening and for that reason it was elected to start her  on antibiotics.  She was started on Tequin 400 mg IV which was later  converted to p.o. Tequin.  She rapidly defervesced.  By postoperative day #3  her condition was considered to be satisfactory for discharge.  She was  discharged home with instructions for progressively-increasing physical  activity.  Specific instructions for her abdominoplasty were given by Dr.  Benna Dunks.  She was given instructions to follow up with him in approximately 5  days for her first postoperative visit and to follow up with me in  approximately 1 week for postoperative visit.  She was given Percocet to be  taken as needed for pain.  She was to continue on the Tequin for  approximately 2 more days.  Freddy Finner, M.D.   WRN/MEDQ  D:  04/27/2004  T:  04/27/2004  Job:  [id number]

## 2011-09-17 DIAGNOSIS — D126 Benign neoplasm of colon, unspecified: Secondary | ICD-10-CM | POA: Insufficient documentation

## 2011-09-17 DIAGNOSIS — G43009 Migraine without aura, not intractable, without status migrainosus: Secondary | ICD-10-CM | POA: Insufficient documentation

## 2011-10-07 ENCOUNTER — Other Ambulatory Visit: Payer: Self-pay | Admitting: Obstetrics & Gynecology

## 2011-10-07 DIAGNOSIS — Z1231 Encounter for screening mammogram for malignant neoplasm of breast: Secondary | ICD-10-CM

## 2011-11-11 ENCOUNTER — Ambulatory Visit
Admission: RE | Admit: 2011-11-11 | Discharge: 2011-11-11 | Disposition: A | Discharge status: Home / Self Care | Payer: 59 | Source: Ambulatory Visit | Attending: Obstetrics & Gynecology | Admitting: Obstetrics & Gynecology

## 2011-11-11 DIAGNOSIS — Z1231 Encounter for screening mammogram for malignant neoplasm of breast: Secondary | ICD-10-CM

## 2011-12-31 ENCOUNTER — Encounter: Payer: Self-pay | Admitting: Vascular Surgery

## 2012-01-08 ENCOUNTER — Encounter: Payer: Self-pay | Admitting: *Deleted

## 2012-01-08 ENCOUNTER — Ambulatory Visit (INDEPENDENT_AMBULATORY_CARE_PROVIDER_SITE_OTHER): Payer: 59 | Admitting: *Deleted

## 2012-01-08 DIAGNOSIS — I781 Nevus, non-neoplastic: Secondary | ICD-10-CM

## 2012-01-08 NOTE — Progress Notes (Signed)
X=.3% Sotradecol administered with a 27g butterfly.  Patient received a total of 6cc foam.  Patient had deep veins. Able to visualize with the vein light. Obtained blood return on all deep sticks injected. [place] for good results if she is compliant with compression and decreasing exercise. Will follow prn.  Photos: yes  Compression stockings applied: yes

## 2012-01-09 ENCOUNTER — Encounter: Payer: Self-pay | Admitting: Vascular Surgery

## 2012-02-05 DIAGNOSIS — R519 Headache, unspecified: Secondary | ICD-10-CM | POA: Insufficient documentation

## 2012-05-07 ENCOUNTER — Emergency Department (INDEPENDENT_AMBULATORY_CARE_PROVIDER_SITE_OTHER): Payer: 59

## 2012-05-07 ENCOUNTER — Emergency Department (HOSPITAL_COMMUNITY)
Admission: EM | Admit: 2012-05-07 | Discharge: 2012-05-07 | Disposition: A | Discharge status: Home / Self Care | Payer: 59 | Source: Home / Self Care | Attending: Emergency Medicine | Admitting: Emergency Medicine

## 2012-05-07 ENCOUNTER — Encounter (HOSPITAL_COMMUNITY): Payer: Self-pay | Admitting: *Deleted

## 2012-05-07 DIAGNOSIS — J45909 Unspecified asthma, uncomplicated: Secondary | ICD-10-CM

## 2012-05-07 DIAGNOSIS — S29012A Strain of muscle and tendon of back wall of thorax, initial encounter: Secondary | ICD-10-CM

## 2012-05-07 DIAGNOSIS — S239XXA Sprain of unspecified parts of thorax, initial encounter: Secondary | ICD-10-CM

## 2012-05-07 HISTORY — DX: Other specified behavioral and emotional disorders with onset usually occurring in childhood and adolescence: F98.8

## 2012-05-07 HISTORY — DX: Irritable bowel syndrome, unspecified: K58.9

## 2012-05-07 MED ORDER — NAPROXEN 500 MG PO TABS: 500.0000 mg | ORAL_TABLET | Freq: Two times a day (BID) | ORAL | Status: DC

## 2012-05-07 MED ORDER — PANTOPRAZOLE SODIUM 40 MG PO TBEC: 40.0000 mg | DELAYED_RELEASE_TABLET | Freq: Every day | ORAL | Status: DC

## 2012-05-07 MED ORDER — CYCLOBENZAPRINE HCL 10 MG PO TABS: 10.0000 mg | ORAL_TABLET | Freq: Three times a day (TID) | ORAL | Status: AC | PRN

## 2012-05-07 MED ORDER — ALBUTEROL SULFATE HFA 108 (90 BASE) MCG/ACT IN AERS: 1.0000 | INHALATION_SPRAY | Freq: Four times a day (QID) | RESPIRATORY_TRACT | Status: DC | PRN

## 2012-05-07 MED ORDER — KETOROLAC TROMETHAMINE 30 MG/ML IJ SOLN
60.0000 mg | Freq: Once | INTRAMUSCULAR | Status: AC
  Administered 2012-05-07: 60 mg via INTRAMUSCULAR

## 2012-05-07 MED ORDER — FAMOTIDINE 20 MG PO TABS: 20.0000 mg | ORAL_TABLET | Freq: Two times a day (BID) | ORAL | Status: DC

## 2012-05-07 MED ORDER — FLUTICASONE PROPIONATE 50 MCG/ACT NA SUSP: 2.0000 | Freq: Every day | NASAL | Status: DC

## 2012-05-07 MED ORDER — PREDNISONE 20 MG PO TABS
ORAL_TABLET | ORAL | Status: AC
  Filled 2012-05-07: qty 3

## 2012-05-07 MED ORDER — METHYLPREDNISOLONE ACETATE 80 MG/ML IJ SUSP
INTRAMUSCULAR | Status: AC
Start: 2012-05-07 — End: 2012-05-07
  Filled 2012-05-07: qty 1

## 2012-05-07 MED ORDER — SUCRALFATE 1 GM/10ML PO SUSP: 1.0000 g | Freq: Four times a day (QID) | ORAL | Status: DC

## 2012-05-07 MED ORDER — PREDNISONE 20 MG PO TABS
60.0000 mg | ORAL_TABLET | Freq: Once | ORAL | Status: AC
  Administered 2012-05-07: 60 mg via ORAL

## 2012-05-07 MED ORDER — METHYLPREDNISOLONE ACETATE 40 MG/ML IJ SUSP
80.0000 mg | Freq: Once | INTRAMUSCULAR | Status: AC
  Administered 2012-05-07: 80 mg via INTRAMUSCULAR

## 2012-05-07 MED ORDER — KETOROLAC TROMETHAMINE 30 MG/ML IJ SOLN
INTRAMUSCULAR | Status: AC
  Filled 2012-05-07: qty 1

## 2012-05-07 NOTE — ED Notes (Signed)
Pt  Refused  Prednisone   -  It  Was  Not  Given     Order  Changed  To  Depo  Medrol

## 2012-05-07 NOTE — ED Notes (Signed)
Pt  Reports    She   Has         Had   Symptoms  Of  [place]ers    Last  Week  With  Congestion    Sneezing    Coughing         -  She  Reports  She  Took  Course  Of  Anti biotics       And  Prednisone    Which  She  Stopped     Yesterday          Because  She  Felt  Some  Indigestion      -  She  Reports  l  Subscapular    Pain        At  This  Time  -  She  Is  Awake  And  Alert  Speaking in  Complete  sentances          Sitting  Upright on  Exam  Table  She  Reports  Has  appt  With her pcp tommorow

## 2012-05-07 NOTE — Discharge Instructions (Signed)
Take two puffs from your albuterol inhaler every 4 hours. Start mucinex. You may take tylenol 1 gram up to 4 times a day as needed for pain. This with 600 mg of motrin is an effective combination for pain and fever. Make sure you drink extra fluids. Return if you get worse, have a fever >100.4, or any other concerns.   Go to www.goodrx.com to look up your medications. This will give you a list of where you can find your prescriptions at the most affordable prices.

## 2012-05-07 NOTE — ED Provider Notes (Addendum)
History     CSN: [id number]  Arrival date & time 05/07/12  1509   First MD Initiated Contact with Patient 05/07/12 1603      Chief Complaint  Patient presents with  . Back Pain    (Consider location/radiation/quality/duration/timing/severity/associated sxs/prior treatment) HPI Comments: Patient reports acute onset of left-sided constant, sharp back pain starting last night. described "As if somebody is grinding the heel of their hand into my back" It is worse with lying down and with eating, better with sitting forward. Patient reports increased belching. It is not affected with exertion. No nausea, vomiting, fevers. No diaphoresis, radiation up neck or down arm. No wheezing. Also reports URI like symptoms and chest tightness for the past 5 days, with clear rhinorrhea, nasal and chest congestion, sneezing, irritated throat. Is coughing up some yellowish phlegm. States this feels similar to previous episodes of reactive airways disease that she's had with URI. States she's been using her Ventolin inhaler with improvement in her shortness of breath. Patient started herself on erythromycin for possible bronchitis, 500 mg bid x 5 days last dose was 2 days ago. Also intermittent prednisone 10 mg and 5 mg with improvement in SOB. Tried NS nebulizer last night without relief. States she stopped the prednisone yesterday, because of "upset stomach". Her back pain started later that night. Patient states her respiratory symptoms started after flying to [place], but denies any leg swelling, calf pain.  No history of DVT or PE. No history of cancer. Patient is not a smoker, has no history of coronary disease, hypertension, asthma, diabetes. No family history of early coronary disease.  Patient is a 56 y.o. female presenting with back pain. The history is provided by the patient. No language interpreter was used.  Back Pain  This is a new problem. The problem has been gradually worsening. The pain is  associated with no known injury. The pain is present in the thoracic spine. The pain does not radiate. The symptoms are aggravated by certain positions. The pain is worse during the night. Pertinent negatives include no fever, no abdominal pain and no abdominal swelling.    Past Medical History  Diagnosis Date  . IBS (irritable bowel syndrome)   . Thyroid disease   . ADD (attention deficit disorder)   . Lyme disease     Past Surgical History  Procedure Date  . Abdominal hysterectomy   . Breast surgery     History reviewed. No pertinent family history.  History  Substance Use Topics  . Smoking status: Never Smoker   . Smokeless tobacco: Not on file  . Alcohol Use: Yes     socially    OB History    Grav Para Term Preterm Abortions TAB SAB Ect Mult Living                  Review of Systems  Constitutional: Negative for fever.  Gastrointestinal: Negative for abdominal pain.  Musculoskeletal: Positive for back pain.    Allergies  Iodine; Milk-related compounds; Penicillins; and Sulfamethoxazole w-trimethoprim  Home Medications   Current Outpatient Rx  Name Route Sig Dispense Refill  . LEVOTHYROXINE SODIUM 100 MCG PO TABS Oral Take 100 mcg by mouth daily.    . ALBUTEROL SULFATE HFA 108 (90 BASE) MCG/ACT IN AERS Inhalation Inhale 1-2 puffs into the lungs every 6 (six) hours as needed for wheezing. Dispense with aerochamber 1 Inhaler 0  . CYCLOBENZAPRINE HCL 10 MG PO TABS Oral Take 1 tablet (10 mg total)  by mouth 3 (three) times daily as needed for muscle spasms. 20 tablet 0  . FAMOTIDINE 20 MG PO TABS Oral Take 1 tablet (20 mg total) by mouth 2 (two) times daily. 40 tablet 0  . FLUTICASONE PROPIONATE 50 MCG/ACT NA SUSP Nasal Place 2 sprays into the nose daily. 16 g 2  . NAPROXEN 500 MG PO TABS Oral Take 1 tablet (500 mg total) by mouth 2 (two) times daily. 20 tablet 0  . PANTOPRAZOLE SODIUM 40 MG PO TBEC Oral Take 1 tablet (40 mg total) by mouth daily. 20 tablet 0  .  SUCRALFATE 1 GM/10ML PO SUSP Oral Take 10 mLs (1 g total) by mouth 4 (four) times daily. 10 mL before meals and before bedtime 240 mL 0    BP 143/82  Pulse 86  Temp(Src) 98.3 F (36.8 C) (Oral)  Resp 16  SpO2 98%  Physical Exam  Nursing note and vitals reviewed. Constitutional: She is oriented to person, place, and time. She appears well-developed and well-nourished.       appears uncomfortable  HENT:  Head: Normocephalic and atraumatic.  Eyes: Conjunctivae and EOM are normal.  Neck: Normal range of motion.  Cardiovascular: Normal rate, regular rhythm, normal heart sounds and intact distal pulses.  Exam reveals no gallop and no friction rub.   No murmur heard. Pulmonary/Chest: Effort normal and breath sounds normal. No respiratory distress. She has no wheezes. She has no rales. She exhibits no tenderness.  Abdominal: Soft. Bowel sounds are normal. She exhibits no distension. There is no tenderness. There is no rebound, no guarding and no CVA tenderness.  Musculoskeletal: Normal range of motion. She exhibits no edema and no tenderness.       Thoracic back: She exhibits tenderness, pain and spasm. She exhibits no bony tenderness, no swelling and no edema.       Back:       Arms:      Pain worse with torso rotation, movement of arm, lying down flat. Better with sitting up and leaning forward  Neurological: She is alert and oriented to person, place, and time.  Skin: Skin is warm and dry.  Psychiatric: She has a normal mood and affect. Her behavior is normal. Judgment and thought content normal.    ED Course  Procedures (including critical care time)  Labs Reviewed - No data to display Dg Chest 2 View  05/07/2012  *RADIOLOGY REPORT*  Clinical Data: Chest pain, shortness of breath.  CHEST - 2 VIEW  Comparison: None.  Findings: Heart and mediastinal contours are within normal limits. No focal opacities or effusions.  No acute bony abnormality.  IMPRESSION: No active cardiopulmonary  disease.  Original Report Authenticated By: Cyndie Chime, M.D.     1. Reactive airway disease   2. Rhomboid muscle strain    EKG: Normal sinus rhythm, rate 75. Normal axis, normal intervals, no hypertrophy. No ST T-wave changes compared to EKG from 2005.  MDM  Previous records reviewed. addtl PMH obtained   Patient has reproduceable tenderness of her trapezius left side, and is worsened with lying down flat, palpation. Aggravated somewhat by torso rotation. It could be GI, especially since she has been taking medicines that would cause gastritis. Symptoms better with leaning forward.  patient does have h/o recent travel, but was a short plane trip. Pt does not meet PERC criteria because of her age, but patient reports no history of DVT or PE, leg swelling, immobilization >3 days, surgery in the past  4 weeks, hemoptysis, history of cancer. She's satting 98% on room air, and is not tachycardic. Calves symmetric, do not suspect DVT at this time.   Checking EKG to evaluate for ischemia, PE, pericarditis. Checking chest x-ray for effusion, pneumonia, pneumothorax. Given patient's history of reactive airway disease, and improvement in her shortness of breath with the steroids, will give her Depo-Medrol and have her restart her inhaler on a regular basis at home Patient appears to be in moderate amount of pain. Giving Toradol IM 60 as well. Will reevaluate.  X-ray reviewed by myself. No pneumonia, PTX. Full report per radiology. Home with NSAIDs, muscle relaxants for back spasm.. Having her restart the albuterol for reactive airway disease from URI. Given the patient appears to have a gastritis, and sending her home with NSAIDs, will also give her some Pepcid/Protonix/Carafate. Discussed all findings, MDM with patient. Patient agrees with plan. she has a followup appointment with her doctor in the next several days  Luiz Blare, MD 05/08/12 1610  Luiz Blare, MD 05/08/12 [phone]

## 2012-07-23 HISTORY — PX: FOOT SURGERY: SHX648

## 2012-10-05 ENCOUNTER — Other Ambulatory Visit: Payer: Self-pay | Admitting: Obstetrics & Gynecology

## 2012-10-05 DIAGNOSIS — Z1231 Encounter for screening mammogram for malignant neoplasm of breast: Secondary | ICD-10-CM

## 2012-10-25 ENCOUNTER — Ambulatory Visit (INDEPENDENT_AMBULATORY_CARE_PROVIDER_SITE_OTHER): Payer: 59 | Admitting: Family Medicine

## 2012-10-25 ENCOUNTER — Ambulatory Visit: Payer: 59

## 2012-10-25 VITALS — BP 122/80 | HR 101 | Temp 98.0°F | Resp 16 | Ht 66.0 in | Wt 165.0 lb

## 2012-10-25 DIAGNOSIS — R059 Cough, unspecified: Secondary | ICD-10-CM

## 2012-10-25 DIAGNOSIS — J40 Bronchitis, not specified as acute or chronic: Secondary | ICD-10-CM

## 2012-10-25 DIAGNOSIS — J9801 Acute bronchospasm: Secondary | ICD-10-CM

## 2012-10-25 DIAGNOSIS — R05 Cough: Secondary | ICD-10-CM

## 2012-10-25 MED ORDER — AZITHROMYCIN 250 MG PO TABS: ORAL_TABLET | ORAL | Status: DC

## 2012-10-25 MED ORDER — HYDROCODONE-HOMATROPINE 5-1.5 MG/5ML PO SYRP: ORAL_SOLUTION | ORAL | Status: DC

## 2012-10-25 MED ORDER — BENZONATATE 100 MG PO CAPS: 100.0000 mg | ORAL_CAPSULE | Freq: Three times a day (TID) | ORAL | Status: DC | PRN

## 2012-10-25 MED ORDER — ALBUTEROL SULFATE HFA 108 (90 BASE) MCG/ACT IN AERS: 1.0000 | INHALATION_SPRAY | Freq: Four times a day (QID) | RESPIRATORY_TRACT | Status: DC | PRN

## 2012-10-25 NOTE — Patient Instructions (Signed)
You can use the albuterol up to every 4 hours only as needed for wheezing or shortness of breath.  Tessalon for cough during the day, hycodan at night if needed and only if not short of breath. Start antibiotic.  Return to the clinic or go to the nearest emergency room if any of your symptoms worsen or new symptoms occur.

## 2012-10-25 NOTE — Progress Notes (Signed)
  Subjective:    Patient ID: Jane Gutierrez, female    DOB: Nov 12, 1956, 56 y.o.   MRN: [id number]  HPI   Review of Systems     Objective:   Physical Exam        Assessment & Plan:   addendum - at end of visit at check out - requested refill of albuterol as almost out.  # 1 refill given - can use up to every 4-6 hours if needed, but if more frequent use needed, or continuous use for more than few days - rtc or ER.  Understanding expressed.

## 2012-10-25 NOTE — Addendum Note (Signed)
Addended by: Meredith Staggers R on: 10/25/2012 09:19 AM   Modules accepted: Orders

## 2012-10-25 NOTE — Progress Notes (Signed)
  Subjective:    Patient ID: Jane Gutierrez, female    DOB: 29-Nov-1956, 56 y.o.   MRN: [id number]  HPI [name] Shown is a 56 y.o. female  Ear pressure 5-6 days ago, sore throat next day,  Laryngitis and more sob 2 days ago. More short of breath past 2 days, and yellow sputum with cough for past 3 days.  No measured fever, but feels hot and cold. Tx: benadryl, albuterol inhaler occasionally - once this am- using 2-3 times per day for shortness of breath. Not wheezing.    Received flu shot October 3rd, hx of pneumonia vaccine.  Fiancee with autoimmune disease recently for 10 days.  On antibiotics.     Physician liason, and diabetic/nutrition classes.   Nonsmoker.   Review of Systems  Constitutional: Negative for fever and chills.  HENT: Positive for congestion, rhinorrhea and voice change.   Respiratory: Positive for cough and shortness of breath.           Objective:   Physical Exam  Vitals reviewed. Constitutional: She is oriented to person, place, and time. She appears well-developed and well-nourished. No distress.  HENT:  Head: Normocephalic and atraumatic.  Right Ear: Hearing, tympanic membrane, external ear and ear canal normal.  Left Ear: Hearing, tympanic membrane, external ear and ear canal normal.  Nose: Nose normal.  Mouth/Throat: Oropharynx is clear and moist. No oropharyngeal exudate.  Eyes: Conjunctivae normal and EOM are normal. Pupils are equal, round, and reactive to light.  Cardiovascular: Normal rate, regular rhythm, normal heart sounds and intact distal pulses.   No murmur heard. Pulmonary/Chest: Effort normal and breath sounds normal. No respiratory distress. She has no wheezes. She has no rhonchi.       Normal effort, but coughs throughout visit. No wheeze heard.   Neurological: She is alert and oriented to person, place, and time.  Skin: Skin is warm and dry. No rash noted.  Psychiatric: She has a normal mood and affect. Her behavior is normal.   [facility]  reading (PRIMARY) by  Dr. Neva Seat: CXR: few increased rll/rml markings without discrete infiltrate.      Assessment & Plan:  Jane Gutierrez is a 56 y.o. female 1. Cough  DG Chest 2 View, benzonatate (TESSALON PERLES) 100 MG capsule, HYDROcodone-homatropine (HYCODAN) 5-1.5 MG/5ML syrup  2. Bronchospasm    3. Bronchitis  azithromycin (ZITHROMAX) 250 MG tablet   Viral uri - early bronchitis vs cap with increasing dyspnea. zpak as above, tessalon during day if needed. Hycodan qhs prn - only if not dyspneic or wheezing - understanding expressed.  Has albuterol at home if needed. rtc precautions.

## 2012-11-09 ENCOUNTER — Ambulatory Visit (INDEPENDENT_AMBULATORY_CARE_PROVIDER_SITE_OTHER): Payer: 59 | Admitting: Family Medicine

## 2012-11-09 VITALS — BP 124/82 | HR 96 | Temp 98.0°F | Resp 17 | Ht 64.5 in | Wt 167.0 lb

## 2012-11-09 DIAGNOSIS — J029 Acute pharyngitis, unspecified: Secondary | ICD-10-CM

## 2012-11-09 DIAGNOSIS — J45909 Unspecified asthma, uncomplicated: Secondary | ICD-10-CM

## 2012-11-09 DIAGNOSIS — R05 Cough: Secondary | ICD-10-CM

## 2012-11-09 DIAGNOSIS — R059 Cough, unspecified: Secondary | ICD-10-CM

## 2012-11-09 MED ORDER — METHYLPREDNISOLONE SODIUM SUCC 125 MG IJ SOLR
125.0000 mg | Freq: Once | INTRAMUSCULAR | Status: AC
  Administered 2012-11-09: 125 mg via INTRAMUSCULAR

## 2012-11-09 MED ORDER — IPRATROPIUM-ALBUTEROL 0.5-2.5 (3) MG/3ML IN SOLN: 3.0000 mL | RESPIRATORY_TRACT | Status: DC | PRN

## 2012-11-09 NOTE — Progress Notes (Signed)
[facility]:  Office Visit  Chief Complaint:  Chief Complaint  Patient presents with  . Cough    was on medication on 10/25/12. Then she went to [facility] for a bithday party and saw another doctor and he gave her Levaquin  and dyamist. She has this awful barky cough. She did sweep out her garage in October . There was a rat in the garage and a lot of the rat fesies.  She is wandering if she  has a fungal infection from this.  . Nasal Congestion    HPI: Toriann Frankey Shown is a 56 y.o. female who complains of  Cough with productive green sputum. She was put on Azithromycin by [facility] for bacterial bronchitis, then went to beach and did not feel better so went to another urgent care and was rx Levaquin. Still does not feel better. Has barking cough . Took her fiance's prednisone 10 mg daily still did not feel better. Able to cough clear sputum after taking prednisone. She was not able to take another of prednisone oral steroids make her go loopy but she can take IM steroids. Chest is clear. Denies fevers or chillls.  Cogh medicine did help her but finished on Thrsday.   Past Medical History  Diagnosis Date  . IBS (irritable bowel syndrome)   . Thyroid disease   . ADD (attention deficit disorder)   . Lyme disease   . Allergy   . Cancer   . Asthma    Past Surgical History  Procedure Date  . Abdominal hysterectomy   . Breast surgery   . Cosmetic surgery   . Brain surgery    History   Social History  . Marital Status: Divorced    Spouse Name: N/A    Number of Children: N/A  . Years of Education: N/A   Social History Main Topics  . Smoking status: Never Smoker   . Smokeless tobacco: None  . Alcohol Use: Yes     Comment: socially  . Drug Use: No  . Sexually Active: Yes   Other Topics Concern  . None   Social History Narrative  . None   Family History  Problem Relation Age of Onset  . Cancer Mother   . CVA Father   . Cancer Sister    Allergies  Allergen  Reactions  . Iodine     REACTION: anaphylactic  . Milk-Related Compounds   . Penicillins   . Sulfamethoxazole W-Trimethoprim    Prior to Admission medications   Medication Sig Start Date End Date Taking? Authorizing Provider  albuterol (PROVENTIL HFA;VENTOLIN HFA) 108 (90 BASE) MCG/ACT inhaler Inhale 1-2 puffs into the lungs every 6 (six) hours as needed for wheezing. Dispense with aerochamber 10/25/12 10/25/13 Yes Shade Flood, MD  Azelastine-Fluticasone [facility]) 137-50 MCG/ACT SUSP Place into the nose.   Yes Historical Provider, MD  benzonatate (TESSALON PERLES) 100 MG capsule Take 1 capsule (100 mg total) by mouth 3 (three) times daily as needed for cough. 10/25/12  Yes Shade Flood, MD  Cholecalciferol (VITAMIN D) 2000 UNITS CAPS Take 1 capsule by mouth daily.   Yes Historical Provider, MD  levothyroxine (SYNTHROID, LEVOTHROID) 100 MCG tablet Take 100 mcg by mouth daily.   Yes Historical Provider, MD  azithromycin (ZITHROMAX) 250 MG tablet Take 2 pills by mouth on day 1, then 1 pill by mouth per day on days 2 through 5. 10/25/12   Shade Flood, MD  famotidine (PEPCID) 20 MG  tablet Take 1 tablet (20 mg total) by mouth 2 (two) times daily. 05/07/12 05/07/13  Luiz Blare, MD  fluticasone (FLONASE) 50 MCG/ACT nasal spray Place 2 sprays into the nose daily. 05/07/12 05/07/13  Luiz Blare, MD  HYDROcodone-homatropine [facility]) 5-1.5 MG/5ML syrup 72m by mouth a bedtime as needed for cough. 10/25/12   Shade Flood, MD  levofloxacin (LEVAQUIN) 500 MG tablet Take 500 mg by mouth daily.    Historical Provider, MD  naproxen (NAPROSYN) 500 MG tablet Take 1 tablet (500 mg total) by mouth 2 (two) times daily. 05/07/12 05/07/13  Luiz Blare, MD  pantoprazole (PROTONIX) 40 MG tablet Take 1 tablet (40 mg total) by mouth daily. 05/07/12 05/07/13  Luiz Blare, MD  sucralfate (CARAFATE) 1 GM/10ML suspension Take 10 mLs (1 g total) by mouth 4 (four) times daily. 10 mL before meals  and before bedtime 05/07/12 06/06/12  Luiz Blare, MD     ROS: The patient denies fevers, chills, night sweats, unintentional weight loss, chest pain, palpitations,  nausea, vomiting, abdominal pain, dysuria, hematuria, melena, numbness, weakness, or tingling.   All other systems have been reviewed and were otherwise negative with the exception of those mentioned in the HPI and as above.    PHYSICAL EXAM: Filed Vitals:   11/09/12 1016  BP: 124/82  Pulse: 96  Temp: 98 F (36.7 C)  Resp: 17   Filed Vitals:   11/09/12 1016  Height: 5' 4.5" (1.638 m)  Weight: 167 lb (75.751 kg)   Body mass index is 28.22 kg/(m^2).  General: Alert, no acute distress HEENT:  Normocephalic, atraumatic, oropharynx patent. Tm nl. + Post nasal drip Cardiovascular:  Regular rate and rhythm, no rubs murmurs or gallops.  No Carotid bruits, radial pulse intact. No pedal edema.  Respiratory: Clear to auscultation bilaterally.  No wheezes, rales, or rhonchi.  No cyanosis, no use of accessory musculature GI: No organomegaly, abdomen is soft and non-tender, positive bowel sounds.  No masses. Skin: No rashes. Neurologic: Facial musculature symmetric. Psychiatric: Patient is appropriate throughout our interaction. Lymphatic: No cervical lymphadenopathy Musculoskeletal: Gait intact.   LABS: No results found for this or any previous visit.   EKG/XRAY:   Primary read interpreted by Dr. Conley Rolls at [facility].   ASSESSMENT/PLAN: Encounter Diagnoses  Name Primary?  . Pharyngitis Yes  . Cough   . Asthma    1. Solumedrol 125 mg IM x 1 for asthmatic bronchospasm 2. Rx Duoneb nebs to take at home to use scheduled until cough and wheeze at night improved , may use inh when out and about 3. Hydromet syrup 120 ml #1 rf 4. NO abx needed, patient already been on 2 rounds.     Greenlee Ancheta PHUONG, DO 11/09/2012 11:26 AM

## 2012-11-16 ENCOUNTER — Ambulatory Visit
Admission: RE | Admit: 2012-11-16 | Discharge: 2012-11-16 | Disposition: A | Discharge status: Home / Self Care | Payer: 59 | Source: Ambulatory Visit | Attending: Obstetrics & Gynecology | Admitting: Obstetrics & Gynecology

## 2012-11-16 DIAGNOSIS — Z1231 Encounter for screening mammogram for malignant neoplasm of breast: Secondary | ICD-10-CM

## 2012-11-16 DIAGNOSIS — E785 Hyperlipidemia, unspecified: Secondary | ICD-10-CM | POA: Insufficient documentation

## 2012-11-20 ENCOUNTER — Other Ambulatory Visit: Payer: Self-pay | Admitting: Obstetrics & Gynecology

## 2012-11-20 DIAGNOSIS — R928 Other abnormal and inconclusive findings on diagnostic imaging of breast: Secondary | ICD-10-CM

## 2012-12-04 ENCOUNTER — Ambulatory Visit
Admission: RE | Admit: 2012-12-04 | Discharge: 2012-12-04 | Disposition: A | Discharge status: Home / Self Care | Payer: 59 | Source: Ambulatory Visit | Attending: Obstetrics & Gynecology | Admitting: Obstetrics & Gynecology

## 2012-12-04 ENCOUNTER — Other Ambulatory Visit: Payer: Self-pay | Admitting: Obstetrics & Gynecology

## 2012-12-04 ENCOUNTER — Other Ambulatory Visit: Payer: Self-pay | Admitting: Radiology

## 2012-12-04 DIAGNOSIS — R928 Other abnormal and inconclusive findings on diagnostic imaging of breast: Secondary | ICD-10-CM

## 2012-12-04 DIAGNOSIS — R921 Mammographic calcification found on diagnostic imaging of breast: Secondary | ICD-10-CM

## 2012-12-07 ENCOUNTER — Telehealth (INDEPENDENT_AMBULATORY_CARE_PROVIDER_SITE_OTHER): Payer: Self-pay | Admitting: General Surgery

## 2012-12-07 ENCOUNTER — Other Ambulatory Visit: Payer: Self-pay | Admitting: Obstetrics & Gynecology

## 2012-12-07 ENCOUNTER — Ambulatory Visit
Admission: RE | Admit: 2012-12-07 | Discharge: 2012-12-07 | Disposition: A | Discharge status: Home / Self Care | Payer: 59 | Source: Ambulatory Visit | Attending: Obstetrics & Gynecology | Admitting: Obstetrics & Gynecology

## 2012-12-07 DIAGNOSIS — R921 Mammographic calcification found on diagnostic imaging of breast: Secondary | ICD-10-CM

## 2012-12-07 DIAGNOSIS — C50912 Malignant neoplasm of unspecified site of left female breast: Secondary | ICD-10-CM

## 2012-12-07 NOTE — Telephone Encounter (Signed)
Message copied by Littie Deeds on Mon Dec 07, 2012  4:34 PM ------      Message from: Leandrew Koyanagi      Created: Mon Dec 07, 2012 12:45 PM      Regarding: Appointment       Pricilla Riffle,            Is there any way to schedule Daquisha's appointment on 12/17? I know Dwain Sarna is in surgery on the 18th and Malanie is out of town on 12/19 and 12/20.            Thanks,            Marice Potter                   I had a breast biopsy on Friday -just got an email through my Chart that I have an appointment with Dr Dwain Sarna at 1030 on 12/20.      ( I meet with Dr Jean Rosenthal the radiologist today at 3 to get my results)            I am out off town the 20 and 21 I know it is almost impossible to get in to see a doctor in the practice-I appreciate you taking time to give me advice!            Cornelia G. Rennie Plowman, RD, LDN      Physician Liaison      [facility]      [phone]

## 2012-12-07 NOTE — Telephone Encounter (Signed)
LMOM letting pt know that her appt w/ Dr. Dwain Sarna is on 12/23 at 3:20, not 12/20.

## 2012-12-10 ENCOUNTER — Ambulatory Visit (HOSPITAL_COMMUNITY): Payer: 59

## 2012-12-11 ENCOUNTER — Encounter (INDEPENDENT_AMBULATORY_CARE_PROVIDER_SITE_OTHER): Payer: Self-pay | Admitting: General Surgery

## 2012-12-14 ENCOUNTER — Encounter (INDEPENDENT_AMBULATORY_CARE_PROVIDER_SITE_OTHER): Payer: Self-pay | Admitting: General Surgery

## 2012-12-14 ENCOUNTER — Ambulatory Visit
Admission: RE | Admit: 2012-12-14 | Discharge: 2012-12-14 | Disposition: A | Discharge status: Home / Self Care | Payer: 59 | Source: Ambulatory Visit | Attending: Obstetrics & Gynecology | Admitting: Obstetrics & Gynecology

## 2012-12-14 ENCOUNTER — Ambulatory Visit (INDEPENDENT_AMBULATORY_CARE_PROVIDER_SITE_OTHER): Payer: Commercial Managed Care - PPO | Admitting: General Surgery

## 2012-12-14 ENCOUNTER — Telehealth (INDEPENDENT_AMBULATORY_CARE_PROVIDER_SITE_OTHER): Payer: Self-pay | Admitting: General Surgery

## 2012-12-14 ENCOUNTER — Other Ambulatory Visit (HOSPITAL_COMMUNITY): Payer: 59

## 2012-12-14 ENCOUNTER — Other Ambulatory Visit (INDEPENDENT_AMBULATORY_CARE_PROVIDER_SITE_OTHER): Payer: Self-pay | Admitting: General Surgery

## 2012-12-14 VITALS — BP 132/80 | HR 62 | Temp 97.0°F | Resp 18 | Ht 65.0 in | Wt 161.8 lb

## 2012-12-14 DIAGNOSIS — C50912 Malignant neoplasm of unspecified site of left female breast: Secondary | ICD-10-CM

## 2012-12-14 DIAGNOSIS — D0590 Unspecified type of carcinoma in situ of unspecified breast: Secondary | ICD-10-CM

## 2012-12-14 DIAGNOSIS — D0510 Intraductal carcinoma in situ of unspecified breast: Secondary | ICD-10-CM | POA: Insufficient documentation

## 2012-12-14 MED ORDER — GADOBENATE DIMEGLUMINE 529 MG/ML IV SOLN
15.0000 mL | Freq: Once | INTRAVENOUS | Status: AC | PRN
  Administered 2012-12-14: 15 mL via INTRAVENOUS

## 2012-12-14 NOTE — Progress Notes (Signed)
Patient ID: Jane Gutierrez, female   DOB: 09/09/1956, 56 y.o.   MRN: [id number]  Chief Complaint  Patient presents with  . New Evaluation    Lft Breast    HPI Jane Gutierrez is a 56 y.o. female.  Referred by Dr. 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 She has significant family history of breast cancer in her twin sister when she was 44 yo as well as her maternal grandmother at age 68  She has undergone previous bilateral mastopexy.  She has also had a benign breast lesion removed on left as well as prior stereotactic biopsy. She underwent routine screening mammogram recently with finding of at least 5.4 cm of calcifications.  She has an MR pending currently. She actually had one in 2008 that she said was normal.  A biopsy of these calcifications was obtained that showed high grade DCIS that is er positive at 100% and pr positive at 86%.  She comes in today to discuss her options.  She has thought a lot about this prior to our visit and she was considering bilateral mastectomies upon arrival.  Past Medical History  Diagnosis Date  . IBS (irritable bowel syndrome)   . Thyroid disease   . ADD (attention deficit disorder)   . Lyme disease   . Allergy   . Cancer   . Asthma     Past Surgical History  Procedure Date  . Abdominal hysterectomy   . Breast surgery   . Cosmetic surgery   . Brain surgery     Family History  Problem Relation Age of Onset  . Cancer Mother   . CVA Father   . Cancer Sister     Social History History  Substance Use Topics  . Smoking status: Never Smoker   . Smokeless tobacco: Not on file  . Alcohol Use: Yes     Comment: socially    Allergies  Allergen Reactions  . Iodine     REACTION: anaphylactic  . Milk-Related Compounds   . Penicillins   . Shellfish-Derived Products   . Sulfamethoxazole W-Trimethoprim     Current Outpatient Prescriptions  Medication Sig Dispense Refill  . albuterol (PROVENTIL HFA;VENTOLIN  HFA) 108 (90 BASE) MCG/ACT inhaler Inhale 1-2 puffs into the lungs every 6 (six) hours as needed for wheezing. Dispense with aerochamber  1 Inhaler  0  . Azelastine-Fluticasone (DYMISTA) 137-50 MCG/ACT SUSP Place into the nose.      . Cholecalciferol (VITAMIN D) 2000 UNITS CAPS Take 1 capsule by mouth daily.      . levothyroxine (SYNTHROID, LEVOTHROID) 100 MCG tablet Take 100 mcg by mouth daily.      . azithromycin (ZITHROMAX) 250 MG tablet Take 2 pills by mouth on day 1, then 1 pill by mouth per day on days 2 through 5.  6 each  0  . benzonatate (TESSALON PERLES) 100 MG capsule Take 1 capsule (100 mg total) by mouth 3 (three) times daily as needed for cough.  20 capsule  0  . famotidine (PEPCID) 20 MG tablet Take 1 tablet (20 mg total) by mouth 2 (two) times daily.  40 tablet  0  . fluticasone (FLONASE) 50 MCG/ACT nasal spray Place 2 sprays into the nose daily.  16 g  2  . HYDROcodone-homatropine (HYCODAN) 5-1.5 MG/5ML syrup 5m by mouth a bedtime as needed for cough.  120 mL  0  . ipratropium-albuterol (DUONEB) 0.5-2.5 (3) MG/3ML SOLN Take 3 mLs   by nebulization every 4 (four) hours as needed.  360 mL  1  . levofloxacin (LEVAQUIN) 500 MG tablet Take 500 mg by mouth daily.      . naproxen (NAPROSYN) 500 MG tablet Take 1 tablet (500 mg total) by mouth 2 (two) times daily.  20 tablet  0  . pantoprazole (PROTONIX) 40 MG tablet Take 1 tablet (40 mg total) by mouth daily.  20 tablet  0  . sucralfate (CARAFATE) 1 GM/10ML suspension Take 10 mLs (1 g total) by mouth 4 (four) times daily. 10 mL before meals and before bedtime  240 mL  0  . [DISCONTINUED] dextroamphetamine (DEXEDRINE SPANSULE) 5 MG 24 hr capsule Take 5 mg by mouth daily.      . [DISCONTINUED] Eszopiclone (LUNESTA PO) Take by mouth.        Review of Systems Review of Systems  Constitutional: Negative for fever, chills and unexpected weight change.  HENT: Negative for hearing loss, congestion, sore throat, trouble swallowing and voice  change.   Eyes: Negative for visual disturbance.  Respiratory: Negative for cough and wheezing.   Cardiovascular: Negative for chest pain, palpitations and leg swelling.  Gastrointestinal: Negative for nausea, vomiting, abdominal pain, diarrhea, constipation, blood in stool, abdominal distention and anal bleeding.  Genitourinary: Negative for hematuria, vaginal bleeding and difficulty urinating.  Musculoskeletal: Negative for arthralgias.  Skin: Negative for rash and wound.  Neurological: Negative for seizures, syncope and headaches.  Hematological: Negative for adenopathy. Does not bruise/bleed easily.  Psychiatric/Behavioral: Negative for confusion.    Blood pressure 132/80, pulse 62, temperature 97 F (36.1 C), temperature source Temporal, resp. rate 18, height 5' 5" (1.651 m), weight 161 lb 12.8 oz (73.392 kg).  Physical Exam Physical Exam  Vitals reviewed. Constitutional: She appears well-developed and well-nourished.  Cardiovascular: Normal rate, regular rhythm and normal heart sounds.   Pulmonary/Chest: Effort normal and breath sounds normal. She has no wheezes. She has no rales. Right breast exhibits no inverted nipple, no mass, no nipple discharge, no skin change and no tenderness. Left breast exhibits no inverted nipple, no mass, no nipple discharge, no skin change and no tenderness. Breasts are symmetrical.    Abdominal: Soft.  Lymphadenopathy:    She has no cervical adenopathy.    Data Reviewed DIGITAL DIAGNOSTIC LEFT BREAST MAMMOGRAM  Comparison: Prior studies  Findings:  ACR Breast Density Category 2: There is a scattered fibroglandular  pattern.  Magnification views of the left breast demonstrate pleomorphic  microcalcifications located in a segmental distribution within the  upper-outer quadrant of the left breast. These extend for 5.3 cm  in maximal distance. Tissue sampling is recommended. I have  discussed stereotactic core biopsy of the calcifications with  the  patient. The patient would like to proceed. This will be  performed and reported separately.  IMPRESSION:  Pleomorphic calcifications located within the upper-outer quadrant  of the left breast which extend for 5.3 cm in diameter. Tissue  sampling is recommended and stereotactic core biopsy will be  performed and reported separately.  RECOMMENDATION:  Left breast stereotactic biopsy.  I have discussed the findings and recommendations with the patient.  Results were also provided in writing at the conclusion of the  visit.  BI-RADS CATEGORY 4: Suspicious abnormality - biopsy should be  considered.   Assessment    Stage 0 Left breast cancer  Significant family history breast cancer, possible ovarian cancer    Plan    1. Genetics referral- she says her sister was negative for   BRCA testing 12 years ago but testing has certainly changed since then and I think a visit with genetics will be beneficial not just for her but for her daughter and sisters also.  This will not be important for surgical decision making. 2.  MRI pending to assess nodes and right side to ensure no mammographically occult cancer given her history and planned surgery. 3.   We discussed the staging and pathophysiology of breast cancer. We discussed all of the different options for treatment for breast cancer including surgery, chemotherapy, radiation therapy, Herceptin, and antiestrogen therapy.   We discussed a sentinel lymph node biopsy as she does not appear to having lymph node involvement right now. We discussed the performance of that with injection of radioactive tracer and blue dye. We discussed that she would have an incision underneath her axillary hairline. We discussed that there is a bout a 10-20% chance of having a positive node with a sentinel lymph node biopsy and we will await the permanent pathology to make any other first further decisions in terms of her treatment. One of these options might be to  return to the operating room to perform an axillary lymph node dissection. We discussed about a 1-2% risk lifetime of chronic shoulder pain as well as lymphedema associated with a sentinel lymph node biopsy.  We discussed the options for treatment of the breast cancer which included lumpectomy versus a mastectomy. We discussed the performance of the lumpectomy with a wire placement. We discussed a 20% chance of a positive margin requiring reexcision in the operating room. I think a lumpectomy is a possibility despite the size of these calcifications.  We also discussed that she may need radiation therapy or antiestrogen therapy or both if she undergoes lumpectomy. She very much would like to undergone mastectomy and would like to have bilateral mastectomy even with negative genetics.  We discussed that this is not a 100% prevention of cancer.  I think with her family history this certainly is reasonable thought as well as for symmetry.  We discussed the mastectomy and the postoperative care for that as well. We discussed that there is no difference in her survival whether she undergoes lumpectomy with radiation therapy or antiestrogen therapy versus a mastectomy. There is a slight difference in the local recurrence rate being 3-5% with lumpectomy and about 1% with a mastectomy. We discussed the risks of operation including bleeding, infection, possible reoperation. She understands her further therapy will be based on what her stages at the time of her operation. We also discussed immediate reconstruction for which I think she is a good candidate.  I have referred her to Dr Sanger and we will plan on combined surgery pending her evaluation.         Adelai Achey 12/14/2012, 4:47 PM    

## 2012-12-14 NOTE — Telephone Encounter (Signed)
Called and left message for patient on voicemail to advise her appointment with Dr. Kelly Splinter on 12/18/12 at 9:00 is at [facility] and to please call back if there are any questions.

## 2012-12-14 NOTE — Progress Notes (Signed)
Faxed to the attention of Dr. Kelly Splinter (fax # [phone]) referral request, office notes, pathology and test results. Patient already scheduled to see Dr. Kelly Splinter on 12/18/12 at 9:00 per Dr. Dwain Sarna (had direct conversation with Dr. Kelly Splinter). Confirmation received.

## 2012-12-18 ENCOUNTER — Other Ambulatory Visit: Payer: Self-pay | Admitting: Obstetrics & Gynecology

## 2012-12-18 ENCOUNTER — Telehealth: Payer: Self-pay | Admitting: *Deleted

## 2012-12-18 ENCOUNTER — Other Ambulatory Visit (INDEPENDENT_AMBULATORY_CARE_PROVIDER_SITE_OTHER): Payer: Self-pay | Admitting: General Surgery

## 2012-12-18 DIAGNOSIS — D0510 Intraductal carcinoma in situ of unspecified breast: Secondary | ICD-10-CM

## 2012-12-18 DIAGNOSIS — R928 Other abnormal and inconclusive findings on diagnostic imaging of breast: Secondary | ICD-10-CM

## 2012-12-18 NOTE — Telephone Encounter (Signed)
Confirmed 12/24/12 appt w/ pt.  Mailed before appt letter & packet to pt.  Emailed Dana at [facility] to make aware.  Took paperwork to Med [facility] for chart.  Confirmed 01/25/13 genetic appt w/ pt.

## 2012-12-22 ENCOUNTER — Other Ambulatory Visit: Payer: Self-pay | Admitting: Medical Oncology

## 2012-12-22 DIAGNOSIS — D0510 Intraductal carcinoma in situ of unspecified breast: Secondary | ICD-10-CM

## 2012-12-24 ENCOUNTER — Ambulatory Visit (HOSPITAL_BASED_OUTPATIENT_CLINIC_OR_DEPARTMENT_OTHER): Payer: 59 | Admitting: Oncology

## 2012-12-24 ENCOUNTER — Other Ambulatory Visit (HOSPITAL_BASED_OUTPATIENT_CLINIC_OR_DEPARTMENT_OTHER): Payer: 59 | Admitting: Lab

## 2012-12-24 ENCOUNTER — Encounter: Payer: Self-pay | Admitting: Oncology

## 2012-12-24 ENCOUNTER — Telehealth: Payer: Self-pay | Admitting: *Deleted

## 2012-12-24 ENCOUNTER — Other Ambulatory Visit: Payer: Self-pay | Admitting: *Deleted

## 2012-12-24 ENCOUNTER — Ambulatory Visit: Payer: 59

## 2012-12-24 VITALS — BP 144/83 | HR 93 | Temp 97.6°F | Resp 20 | Ht 65.0 in | Wt 160.6 lb

## 2012-12-24 DIAGNOSIS — D0590 Unspecified type of carcinoma in situ of unspecified breast: Secondary | ICD-10-CM

## 2012-12-24 DIAGNOSIS — N63 Unspecified lump in unspecified breast: Secondary | ICD-10-CM

## 2012-12-24 DIAGNOSIS — Z170 Estrogen receptor positive status [ER+]: Secondary | ICD-10-CM

## 2012-12-24 DIAGNOSIS — Z803 Family history of malignant neoplasm of breast: Secondary | ICD-10-CM

## 2012-12-24 DIAGNOSIS — D0510 Intraductal carcinoma in situ of unspecified breast: Secondary | ICD-10-CM

## 2012-12-24 DIAGNOSIS — C50919 Malignant neoplasm of unspecified site of unspecified female breast: Secondary | ICD-10-CM

## 2012-12-24 LAB — COMPREHENSIVE METABOLIC PANEL (CC13)
AST: 23 U/L (ref 5–34)
Albumin: 3.6 g/dL (ref 3.5–5.0)
Alkaline Phosphatase: 38 U/L — ABNORMAL LOW (ref 40–150)
Glucose: 83 mg/dl (ref 70–99)
Potassium: 4.1 mEq/L (ref 3.5–5.1)
Sodium: 139 mEq/L (ref 136–145)
Total Bilirubin: 0.38 mg/dL (ref 0.20–1.20)
Total Protein: 7.3 g/dL (ref 6.4–8.3)

## 2012-12-24 LAB — CBC WITH DIFFERENTIAL/PLATELET
BASO%: 0.5 % (ref 0.0–2.0)
EOS%: 0.9 % (ref 0.0–7.0)
Eosinophils Absolute: 0.1 10*3/uL (ref 0.0–0.5)
LYMPH%: 35.5 % (ref 14.0–49.7)
MCH: 28.8 pg (ref 25.1–34.0)
MCHC: 33.4 g/dL (ref 31.5–36.0)
MCV: 86.2 fL (ref 79.5–101.0)
MONO%: 7.7 % (ref 0.0–14.0)
NEUT#: 4.3 10*3/uL (ref 1.5–6.5)
Platelets: 208 10*3/uL (ref 145–400)
RBC: 5.27 10*6/uL (ref 3.70–5.45)
RDW: 13.5 % (ref 11.2–14.5)

## 2012-12-24 NOTE — Progress Notes (Signed)
Checked in new patient. No financial issues. °

## 2012-12-24 NOTE — Patient Instructions (Addendum)
Proceed with surgery  I will see you back in 2 months 

## 2012-12-24 NOTE — Telephone Encounter (Signed)
2 months

## 2012-12-25 ENCOUNTER — Ambulatory Visit
Admission: RE | Admit: 2012-12-25 | Discharge: 2012-12-25 | Disposition: A | Discharge status: Home / Self Care | Payer: 59 | Source: Ambulatory Visit | Attending: Obstetrics & Gynecology | Admitting: Obstetrics & Gynecology

## 2012-12-25 ENCOUNTER — Other Ambulatory Visit: Payer: Self-pay | Admitting: Diagnostic Radiology

## 2012-12-25 ENCOUNTER — Encounter (HOSPITAL_BASED_OUTPATIENT_CLINIC_OR_DEPARTMENT_OTHER): Payer: Self-pay | Admitting: *Deleted

## 2012-12-25 DIAGNOSIS — R928 Other abnormal and inconclusive findings on diagnostic imaging of breast: Secondary | ICD-10-CM

## 2012-12-25 MED ORDER — GADOBENATE DIMEGLUMINE 529 MG/ML IV SOLN
15.0000 mL | Freq: Once | INTRAVENOUS | Status: AC | PRN
  Administered 2012-12-25: 15 mL via INTRAVENOUS

## 2012-12-25 NOTE — Progress Notes (Signed)
Pt works [employer] Twin sister had bilat mastectomies Will be with her surgery Waiting to find out if sent nodes both sides or just left side To bring all meds and overnight bag-was told may stay 2 nights-will ck with [facility] Does need ca 27.29-had [facility] done cancer [facility] 12/13

## 2012-12-25 NOTE — Progress Notes (Signed)
Times for nuc med chg to 930-pt to arrive 815 DSC-Pt called and notified of chg

## 2012-12-28 ENCOUNTER — Telehealth (INDEPENDENT_AMBULATORY_CARE_PROVIDER_SITE_OTHER): Payer: Self-pay

## 2012-12-28 ENCOUNTER — Encounter (HOSPITAL_BASED_OUTPATIENT_CLINIC_OR_DEPARTMENT_OTHER)
Admission: RE | Admit: 2012-12-28 | Discharge: 2012-12-28 | Disposition: A | Discharge status: Home / Self Care | Payer: 59 | Source: Ambulatory Visit | Attending: General Surgery | Admitting: General Surgery

## 2012-12-28 ENCOUNTER — Encounter: Payer: Self-pay | Admitting: Radiation Oncology

## 2012-12-28 ENCOUNTER — Other Ambulatory Visit (INDEPENDENT_AMBULATORY_CARE_PROVIDER_SITE_OTHER): Payer: Self-pay | Admitting: General Surgery

## 2012-12-28 ENCOUNTER — Encounter: Payer: Self-pay | Admitting: *Deleted

## 2012-12-28 DIAGNOSIS — Z460 Encounter for fitting and adjustment of spectacles and contact lenses: Secondary | ICD-10-CM | POA: Insufficient documentation

## 2012-12-28 DIAGNOSIS — A6920 Lyme disease, unspecified: Secondary | ICD-10-CM | POA: Insufficient documentation

## 2012-12-28 DIAGNOSIS — K589 Irritable bowel syndrome without diarrhea: Secondary | ICD-10-CM | POA: Insufficient documentation

## 2012-12-28 DIAGNOSIS — C801 Malignant (primary) neoplasm, unspecified: Secondary | ICD-10-CM | POA: Insufficient documentation

## 2012-12-28 DIAGNOSIS — E039 Hypothyroidism, unspecified: Secondary | ICD-10-CM | POA: Insufficient documentation

## 2012-12-28 DIAGNOSIS — J45909 Unspecified asthma, uncomplicated: Secondary | ICD-10-CM | POA: Insufficient documentation

## 2012-12-28 DIAGNOSIS — F988 Other specified behavioral and emotional disorders with onset usually occurring in childhood and adolescence: Secondary | ICD-10-CM | POA: Insufficient documentation

## 2012-12-28 DIAGNOSIS — C50912 Malignant neoplasm of unspecified site of left female breast: Secondary | ICD-10-CM | POA: Insufficient documentation

## 2012-12-28 DIAGNOSIS — C50919 Malignant neoplasm of unspecified site of unspecified female breast: Secondary | ICD-10-CM

## 2012-12-28 DIAGNOSIS — E079 Disorder of thyroid, unspecified: Secondary | ICD-10-CM | POA: Insufficient documentation

## 2012-12-28 LAB — CANCER ANTIGEN 27.29: CA 27.29: 17 U/mL (ref 0–39)

## 2012-12-28 NOTE — Telephone Encounter (Signed)
Pt's MRI guided bx of right breast is positive.  SLN of right breast needs to be scheduled.  Pt will be notified by Marliss Czar today of her results.

## 2012-12-28 NOTE — Progress Notes (Signed)
Took pts records to Mrs. Wilma for pt to sign a medical release and pick up Wed.

## 2012-12-29 ENCOUNTER — Other Ambulatory Visit: Payer: Self-pay | Admitting: Plastic Surgery

## 2012-12-29 DIAGNOSIS — C50919 Malignant neoplasm of unspecified site of unspecified female breast: Secondary | ICD-10-CM

## 2012-12-29 NOTE — H&P (Signed)
This document contains confidential information from a [facility] medical record system and may be unauthenticated. Release may be made only with a valid authorization or in accordance with applicable policies of Medical Center or its affiliates. This document must be maintained in a secure manner or discarded/destroyed as required by Medical Center policy or by a confidential means such as shredding.   Jane Gutierrez   12/25/2012 10:00 AM Office Visit  MRN: [id number]  Department: Plastic Surgery  Dept Phone: [phone]  Description: Female DOB: [birth year]  Provider: Fanny Bien Rayburn, PA-C   Diagnoses  -  Breast cancer   - Primary   Vitals - Last Recorded     135/83  82           Subjective:    Patient ID: Jane Gutierrez is a 57 y.o. female.  HPI The patient is a 57 yrs old wf here for pre-operative history and physical for breast reconstruction.    She is a Insurance account manager at [employer]. She has significant family history of breast cancer in her twin sister when she was 49 yrs old and her maternal grandmother at age 60.  She undergone a bilateral mastopexy in 2007 at [facility]. She has also had a benign breast lesion removed on the left.  A routine screening mammogram showed a  5.4 cm area of calcification on the left breast. The MR showed a large segmental area of abnormal enhancement in the left upper outer quadrant consistent with the biopsy-proven DCIS.  The right breast showed a 6 mm irregular enhancing area central/12 o'clock for which MRI guided right breast biopsy is recommended.  Her mammagram in 2008 was normal. A biopsy of these calcifications was obtained that showed high grade DCIS that is ER/PR positive. She is 5 feet 5 inches tall and weighs 165 pounds. Her bra is a 36 B-C and she would like to be around a B.  She also had a fronal lobe surgery due to a benign mass, face lift and a hysterectomy.   She had a MRI with biopsy of the right breast lesion this am and is  awaiting pathology for this.   The following portions of the patient's history were reviewed and updated as appropriate: allergies, current medications, past family history, past medical history, past social history, past surgical history and problem list.  Review of Systems  Constitutional: Negative.   HENT: Negative.   Eyes: Negative.   Respiratory: Negative.   Cardiovascular: Negative.   Gastrointestinal: Negative.   Genitourinary: Negative.   Musculoskeletal: Negative.   Hematological: Negative.   Psychiatric/Behavioral: Negative.    Objective:    Physical Exam  Constitutional: She appears well-developed.  HENT:   Head: Normocephalic and atraumatic.  Eyes: Conjunctivae normal and EOM are normal. Pupils are equal, round, and reactive to light.  Neck: Normal range of motion.  Cardiovascular: Normal rate.   Pulmonary/Chest: Effort normal.   Abdominal: Soft. She exhibits no distension. There is no tenderness.  Musculoskeletal: Normal range of motion.  Neurological: She is alert.  Skin: Skin is warm.  Psychiatric: She has a normal mood and affect. Her behavior is normal. Judgment and thought content normal.      Assessment:      1.  Breast cancer       Plan:   Plan to proceed with bilateral mastectomies per Dr. Dwain Sarna then immediate expander and Flex HD placement per Dr. Kelly Splinter. The procedure and risks and benefits were  discussed with the patient and she wishes to proceed. Consent was obtained.   Medications Ordered This Encounter     cephalexin (KEFLEX) 500 MG capsule Take 1 capsule (500 mg total) by mouth 4 times daily.    HYDROcodone-acetaminophen (NORCO) 5-325 mg per  Take 1 tablet by mouth every 6 (six) hours as needed for 10 days for Pain.    diazepam (VALIUM) 2 MG tablet Take 1 tablet (2 mg total) by mouth every 6 (six) hours as needed for 10 days for Anxiety.    promethazine (PHENERGAN) 12.5 MG tablet 30 Take 1 tablet (12.5 mg total) by mouth every 6 (six) hours  as needed for 7 days for Nausea.

## 2012-12-30 ENCOUNTER — Encounter: Payer: Self-pay | Admitting: Radiation Oncology

## 2012-12-30 ENCOUNTER — Ambulatory Visit
Admission: RE | Admit: 2012-12-30 | Discharge: 2012-12-30 | Disposition: A | Discharge status: Home / Self Care | Payer: 59 | Source: Ambulatory Visit | Attending: Radiation Oncology | Admitting: Radiation Oncology

## 2012-12-30 VITALS — BP 133/74 | HR 90 | Temp 97.6°F | Resp 20 | Wt 160.2 lb

## 2012-12-30 DIAGNOSIS — T7840XA Allergy, unspecified, initial encounter: Secondary | ICD-10-CM | POA: Insufficient documentation

## 2012-12-30 DIAGNOSIS — D0510 Intraductal carcinoma in situ of unspecified breast: Secondary | ICD-10-CM

## 2012-12-30 DIAGNOSIS — Z803 Family history of malignant neoplasm of breast: Secondary | ICD-10-CM | POA: Insufficient documentation

## 2012-12-30 DIAGNOSIS — Z79899 Other long term (current) drug therapy: Secondary | ICD-10-CM | POA: Insufficient documentation

## 2012-12-30 DIAGNOSIS — D0590 Unspecified type of carcinoma in situ of unspecified breast: Secondary | ICD-10-CM | POA: Insufficient documentation

## 2012-12-30 DIAGNOSIS — C50119 Malignant neoplasm of central portion of unspecified female breast: Secondary | ICD-10-CM | POA: Insufficient documentation

## 2012-12-30 DIAGNOSIS — Z170 Estrogen receptor positive status [ER+]: Secondary | ICD-10-CM | POA: Insufficient documentation

## 2012-12-30 DIAGNOSIS — E039 Hypothyroidism, unspecified: Secondary | ICD-10-CM | POA: Insufficient documentation

## 2012-12-30 DIAGNOSIS — C50419 Malignant neoplasm of upper-outer quadrant of unspecified female breast: Secondary | ICD-10-CM | POA: Insufficient documentation

## 2012-12-30 NOTE — Progress Notes (Signed)
Pt scheduled for bilateral mastectomies, SN bx, Dr Dwain Sarna. Plastic surgeon will be present, 12/31/12. Pt denies pain, fatigue, loss of appetite. Pt is twin.

## 2012-12-30 NOTE — Addendum Note (Signed)
Encounter addended by: Glennie Hawk, RN on: 12/30/2012 10:38 AM<BR>     Documentation filed: Charges VN

## 2012-12-30 NOTE — Progress Notes (Signed)
Please see the Nurse Progress Note in the MD Initial Consult Encounter for this patient. 

## 2012-12-30 NOTE — Progress Notes (Signed)
Radiation Oncology         [phone] ________________________________  Name: Jane Gutierrez MRN: [id number]  Date: 12/30/2012  DOB: 10/23/1956  WU:[id number] Hessie Diener, MD  Robyne Askew, MD     REFERRING PHYSICIAN: Caleen Essex III, MD   DIAGNOSIS:  1. Dcis left breast: 7.2 cm on MRI; high-grade, ER positive and PR positive 2. Invasive ductal carcinoma of the right breast: T1bNo clinically on MRI scan; breast profile reportedly pending,  HISTORY OF PRESENT ILLNESS::Jane Gutierrez is a 57 y.o. female who is seen for an initial consultation visit. The patient indicates that she routinely undergoes breast mammography. She has had 2 biopsies in the past within the left breast which were negative. She does have a family history of breast cancer including a twin sister.  The patient was found recently to have suspicious calcifications on routine screening mammogram. These measured at least 5.4 cm. She proceeded with a biopsy of these calcifications and this returned positive for high grade DCIS. The tumor is ER positive and PR positive.  The patient underwent an MRI scan which showed the suspicious area in the left breast. This was somewhat larger on MRI scan measuring 7.2 cm in maximum dimension. There is also a 6 mm slightly irregular area in the central right breast at the 12:00 position. A biopsy of this has been performed and this returned positive for invasive ductal carcinoma with associated DCIS. No lymphadenopathy on either side on MRI scan.  The patient has decided to proceed with a bilateral mastectomy and immediate reconstruction is planned. She had expressed a strong interest in bilateral mastectomy even before the MRI scan results. This is scheduled for tomorrow. The patient was discussed at breast conference this morning and I have been asked to see the patient today for evaluation with regards to possible radiotherapy adjuvantly.   PREVIOUS RADIATION THERAPY: No   PAST MEDICAL  HISTORY:  has a past medical history of IBS (irritable bowel syndrome); ADD (attention deficit disorder); Lyme disease; Contact lens/glasses fitting; Breast cancer (12/04/12); Allergy; Asthma; Thyroid disease; and Hypothyroidism.     PAST SURGICAL HISTORY: Past Surgical History  Procedure Date  . Brain surgery     removal benign frontal lobe cyst in 1987  . Foot surgery 2013    bilateral  . Gynecologic cryosurgery     cervical cancer, 1998 last remained disease free with recent normal pap  . Diagnostic laparoscopy 2004    lysis adh-uterine ablation  . Breast surgery     bilateral mastopexy  . Breast surgery     removal benign left breast lesion  . Abdominal hysterectomy 2005    TAH/BSO-lysis adh, enometriosis  . Face lift      FAMILY HISTORY: family history includes CVA in her father; Cancer (age of onset:44) in her sister; Cancer (age of onset:70) in her maternal grandmother; and Cancer (age of onset:75) in her mother.   SOCIAL HISTORY:  reports that she has never smoked. She does not have any smokeless tobacco history on file. She reports that she drinks alcohol. She reports that she does not use illicit drugs.   ALLERGIES: Betadine; Iodine; Milk-related compounds; Penicillins; Shellfish-derived products; and Sulfamethoxazole w-trimethoprim   MEDICATIONS:  Current Outpatient Prescriptions  Medication Sig Dispense Refill  . dextroamphetamine (DEXTROSTAT) 5 MG tablet Take 5 mg by mouth 3 (three) times daily.      [name] albuterol (PROVENTIL HFA;VENTOLIN HFA) 108 (90 BASE) MCG/ACT inhaler Inhale 2 puffs into the lungs  every 6 (six) hours as needed.      [name] azithromycin (ZITHROMAX) 250 MG tablet as needed. Take 2 pills by mouth on day 1, then 1 pill by mouth per day on days 2 through 5.      . Cholecalciferol (VITAMIN D) 2000 UNITS CAPS Take 1 capsule by mouth daily.      . famotidine (PEPCID) 20 MG tablet Take 20 mg by mouth as needed.      [name] levothyroxine (SYNTHROID, LEVOTHROID)  100 MCG tablet Take 100 mcg by mouth daily.      . promethazine (PHENERGAN) 25 MG tablet Take 25 mg by mouth every 6 (six) hours as needed.      . [DISCONTINUED] Eszopiclone (LUNESTA PO) Take by mouth.         REVIEW OF SYSTEMS:  A 15 point review of systems is documented in the electronic medical record. This was obtained by the nursing staff. However, I reviewed this with the patient to discuss relevant findings and make appropriate changes.  Pertinent items are noted in HPI.    PHYSICAL EXAM:  weight is 160 lb 3.2 oz (72.666 kg). Her oral temperature is 97.6 F (36.4 C). Her blood pressure is 133/74 and her pulse is 90. Her respiration is 20.   General: Well-developed, in no acute distress HEENT: Normocephalic, atraumatic; oral cavity clear Neck: Supple without any lymphadenopathy Cardiovascular: Regular rate and rhythm Respiratory: Clear to auscultation bilaterally Breasts: Patient has surgical incisions bilaterally within the breast related to prior mastopexy; palpable biopsy change is present within the upper outer quadrant of the left breast. No mass palpable within the right breast. No lymphadenopathy clinically on either side. GI: Soft, nontender, normal bowel sounds Extremities: No edema present Neuro: No focal deficits     LABORATORY DATA:  Lab Results  Component Value Date   WBC 7.8 12/24/2012   HGB 15.2 12/24/2012   HCT 45.4 12/24/2012   MCV 86.2 12/24/2012   PLT 208 12/24/2012   Lab Results  Component Value Date   NA 139 12/24/2012   K 4.1 12/24/2012   CL 102 12/24/2012   CO2 27 12/24/2012   Lab Results  Component Value Date   ALT 21 12/24/2012   AST 23 12/24/2012   ALKPHOS 38* 12/24/2012   BILITOT 0.38 12/24/2012      RADIOGRAPHY: Mr Breast Bilateral W Wo Contrast  12/28/2012  **ADDENDUM** CREATED: 12/28/2012 10:59:30  Pathology shows:  Breast, right, needle core biopsy, upper central  - INVASIVE DUCTAL CARCINOMA.  - DUCTAL CARCINOMA IN SITU WITH CALCIFICATIONS.  At the request of  patient, I spoke with her by telephone regarding results.  She reports doing well after biopsy.  The patient is scheduled for bilateral mastectomy on Thursday.  Results were called to the office of Dr. Dwain Sarna.  Addended by:  Blair Hailey. Manson Passey, M.D. on 12/28/2012 10:59:30.  **END ADDENDUM** SIGNED BY: Blair Hailey. Manson Passey, M.D.   12/17/2012  *RADIOLOGY REPORT*  Clinical Data: Newly-diagnosed left upper outer quadrant high-grade DCIS manifesting as mammographically detected new calcifications.  BILATERAL BREAST MRI WITH AND WITHOUT CONTRAST  Technique: Multiplanar, multisequence MR images of both breasts were obtained prior to and following the intravenous administration of 15ml of Multihance.  Three dimensional images were evaluated at the independent DynaCad workstation.  Comparison:  Prior mammograms, breast MRI 2008  Findings: No lymphadenopathy or abnormal T2-weighted hyperintensity is identified in either breast.  There is segmental clumped nodular enhancement in the left upper outer quadrant approximating the  extent of mammographically evident calcifications, measuring 7.2 x 3.2 x 2.6 cm.  This is associated with adjacent post biopsy change and demonstrates predominately plateau type enhancement kinetics. Compared to previous similar exam, this is new. The posterior extent of left upper outer quadrant abnormal enhancement is approximately 5 cm posterior to clip artifact from prior biopsy.  In the central right breast 12 o'clock location, there is a 6 mm slightly irregular mass-like area of abnormal enhancement demonstrating plateau type enhancement kinetics.  No other area of abnormal enhancement is seen in either breast.  IMPRESSION: Large segmental area of abnormal left upper outer quadrant enhancement, corresponding to the area of biopsy-proven DCIS.  MRI imaging findings strongly suggest multicentric disease.  If the patient desires left lumpectomy, I would suggest MRI guided core biopsy of the posterior  aspect of abnormal enhancement in the left upper outer quadrant, for pathologic determination of multicentric disease.  6 mm irregular enhancing right breast central/12 o'clock nodular area of enhancement, for which MRI guided right breast biopsy is recommended.  RECOMMENDATION: Bilateral MRI guided core biopsy  THREE-DIMENSIONAL MR IMAGE RENDERING ON INDEPENDENT WORKSTATION:  Three-dimensional MR images were rendered by post-processing of the original MR data on an independent workstation.  The three- dimensional MR images were interpreted, and findings were reported in the accompanying complete MRI report for this study.  BI-RADS CATEGORY 6:  Known biopsy-proven malignancy - appropriate action should be taken.   Original Report Authenticated By: Christiana Pellant, M.D.    Mr Biopsy/wire Localization  12/25/2012  *RADIOLOGY REPORT*  Clinical Data:  6 mm enhancing mass in the upper right breast.  The patient has recent diagnosis of left breast DCIS.  MRI GUIDED VACUUM ASSISTED BIOPSY OF THE RIGHT BREAST WITHOUT AND WITH CONTRAST  Comparison: Previous exams.  Technique: Multiplanar, multisequence MR images of the right breast were obtained prior to and following the intravenous administration of 15 ml of Mulithance.  I met with the patient, and we discussed the procedure of MRI guided biopsy, including risks, benefits, and alternatives. Specifically, we discussed the risks of infection, bleeding, tissue injury, clip migration, and inadequate sampling.  Informed, written consent was given.  Using sterile technique, 2% Lidocaine, MRI guidance, and a 9 gauge vacuum assisted device, biopsy was performed of a 6 mm enhancing mass using a lateral to medial approach.  At the conclusion of the procedure, a tissue marker clip was deployed into the biopsy cavity.  IMPRESSION: MRI guided biopsy of the right breast. No apparent complications.  THREE-DIMENSIONAL MR IMAGE RENDERING ON INDEPENDENT WORKSTATION:  Three-dimensional MR  images were rendered by post-processing of the original MR data on an independent workstation.  The three- dimensional MR images were interpreted, and findings were reported in the accompanying complete MRI report for this study.   Original Report Authenticated By: Britta Mccreedy, M.D.    Mm Breast Stereo Biopsy Left  12/04/2012  *RADIOLOGY REPORT*  Clinical Data:  Left breast calcifications.  STEREOTACTIC-GUIDED VACUUM ASSISTED BIOPSY OF THE LEFT BREAST AND SPECIMEN RADIOGRAPH  Comparison: Previous exams.  I met with the patient and we discussed the procedure of stereotactic-guided biopsy, including benefits and alternatives. We discussed the high likelihood of a successful procedure. We discussed the risks of the procedure, including infection, bleeding, tissue injury, clip migration, and inadequate sampling. Informed, written consent was given.  Using sterile technique, 2% lidocaine, stereotactic guidance, and a 9 gauge vacuum assisted device, biopsy was performed of the pleomorphic calcifications located within the upper-outer quadrant of the  left breast using a lateral approach.  Specimen radiograph was performed, showing multiple representative calcifications within the specimen.  Specimens with calcifications are identified for pathology.  At the conclusion of the procedure, a T-shaped tissue marker clip was deployed into the biopsy cavity.  Follow-up 2-view mammogram confirmed clip to be located along the lateral border of the area of calcifications.  IMPRESSION: Stereotactic-guided biopsy of left breast calcifications as discussed above.  No apparent complications.   Original Report Authenticated By: Rolla Plate, M.D.    Mm Breast Surgical Specimen  12/04/2012  *RADIOLOGY REPORT*  Clinical Data:  Left breast calcifications.  STEREOTACTIC-GUIDED VACUUM ASSISTED BIOPSY OF THE LEFT BREAST AND SPECIMEN RADIOGRAPH  Comparison: Previous exams.  I met with the patient and we discussed the procedure of  stereotactic-guided biopsy, including benefits and alternatives. We discussed the high likelihood of a successful procedure. We discussed the risks of the procedure, including infection, bleeding, tissue injury, clip migration, and inadequate sampling. Informed, written consent was given.  Using sterile technique, 2% lidocaine, stereotactic guidance, and a 9 gauge vacuum assisted device, biopsy was performed of the pleomorphic calcifications located within the upper-outer quadrant of the left breast using a lateral approach.  Specimen radiograph was performed, showing multiple representative calcifications within the specimen.  Specimens with calcifications are identified for pathology.  At the conclusion of the procedure, a T-shaped tissue marker clip was deployed into the biopsy cavity.  Follow-up 2-view mammogram confirmed clip to be located along the lateral border of the area of calcifications.  IMPRESSION: Stereotactic-guided biopsy of left breast calcifications as discussed above.  No apparent complications.   Original Report Authenticated By: Rolla Plate, M.D.    Mm Digital Diag Ltd L  12/04/2012  *RADIOLOGY REPORT*  Clinical Data:  Recall from screening mammography.  Family history of breast cancer in twin sister at age 75 and her grandmother at age 2.  DIGITAL DIAGNOSTIC LEFT BREAST MAMMOGRAM  Comparison: Prior studies  Findings:  ACR Breast Density Category 2: There is a scattered fibroglandular pattern.  Magnification views of the left breast demonstrate pleomorphic microcalcifications located in a segmental distribution within the upper-outer quadrant of the left breast.  These extend for 5.3 cm in maximal distance.  Tissue sampling is recommended.  I have discussed stereotactic core biopsy of the calcifications with the patient.  The patient would like to proceed.  This will be performed and reported separately.  IMPRESSION: Pleomorphic calcifications located within the upper-outer quadrant  of the left breast which extend for 5.3 cm in diameter.  Tissue sampling is recommended and stereotactic core biopsy will be performed and reported separately.  RECOMMENDATION: Left breast stereotactic biopsy.  I have discussed the findings and recommendations with the patient. Results were also provided in writing at the conclusion of the visit.  BI-RADS CATEGORY 4:  Suspicious abnormality - biopsy should be considered.   Original Report Authenticated By: Rolla Plate, M.D.    Mm Digital Diagnostic Unilat R  12/25/2012  *RADIOLOGY REPORT*  Clinical Data:  MRI guided core needle biopsy of a 6 mm nodule of enhancement in the upper right breast was performed.  DIGITAL DIAGNOSTIC RIGHT MAMMOGRAM  Comparison:  Previous exams.  Findings:  Films are performed following MRI guided biopsy of 6 mm enhancing nodule in the upper central right breast.  A dumbbell shaped biopsy clip is present in the upper right breast just medial to the level of the nipple.  The clip is within a biopsy cavity that contains air and  a small air-fluid level.  IMPRESSION: Satisfactory position of biopsy clip in the upper right breast.   Original Report Authenticated By: Britta Mccreedy, M.D.    Mm Radiologist Eval And Mgmt  12/07/2012  *RADIOLOGY REPORT*  ESTABLISHED PATIENT OFFICE VISIT - LEVEL II [phone])  Chief Complaint:  The patient returns for results following left breast stereotactic core biopsy.  History:  The patient was recalled from screening mammography for left breast calcifications.  Tissue sampling was recommended and stereotactic core biopsy was performed on 12/04/2012.  The patient has a family history of breast cancer in a twin sister at age 57. She also has a family history of breast cancer in a maternal grandmother.  Exam:  The biopsy site is clean and dry without hematoma formation or signs of infection.  Pathology: The pathology demonstrates high-grade DCIS which is concordant with the imaging findings.  Assessment and  Plan:  The pathology was reviewed with the patient and her questions were answered.  Post biopsy wound care instructions were reviewed with the patient.  The patient is scheduled for breast MRI on 12/14/2012.  The patient was initially scheduled to see Dr. Dwain Sarna on 12/11/2012.  However, due to previous plans, the patient will reschedule this appointment.  The patient was encouraged to call the [facility] for additional questions or concerns.   Original Report Authenticated By: Rolla Plate, M.D.        IMPRESSION: The patient is a pleasant 57 year old female with a recent diagnosis of bilateral breast cancer. She has a diagnosis of DCIS of the left breast spanning 7.2 cm on MRI scan. She also has a small 6 mm invasive ductal carcinoma centrally within the right breast. No lymphadenopathy clinically.   PLAN: The patient is proceeding with a bilateral mastectomy and immediate reconstruction tomorrow, which has been her strong preference. The patient appears clinically to be at low risk for needing postmastectomy radiotherapy. Proceeding with reconstruction therefore appears very reasonable at this time.   I discussed with her some general aspects of radiotherapy. All of her questions were answered regarding this.  We discussed possible followup options. She indicated that she prefers to discuss her pathology with both surgery and medical oncology, and if there is any concern or questions then she will call to schedule a followup appointment with me. I think that this is very reasonable. I will also look for her final pathologic results which I would expect to be available early next week.      ________________________________   Radene Gunning, MD, PhD

## 2012-12-31 ENCOUNTER — Ambulatory Visit (HOSPITAL_BASED_OUTPATIENT_CLINIC_OR_DEPARTMENT_OTHER): Payer: 59 | Admitting: Anesthesiology

## 2012-12-31 ENCOUNTER — Encounter (HOSPITAL_BASED_OUTPATIENT_CLINIC_OR_DEPARTMENT_OTHER): Payer: Self-pay

## 2012-12-31 ENCOUNTER — Encounter (HOSPITAL_BASED_OUTPATIENT_CLINIC_OR_DEPARTMENT_OTHER)
Admission: RE | Disposition: A | Discharge status: Home / Self Care | Payer: Self-pay | Source: Ambulatory Visit | Attending: General Surgery

## 2012-12-31 ENCOUNTER — Encounter (HOSPITAL_BASED_OUTPATIENT_CLINIC_OR_DEPARTMENT_OTHER): Payer: Self-pay | Admitting: Anesthesiology

## 2012-12-31 ENCOUNTER — Encounter (HOSPITAL_BASED_OUTPATIENT_CLINIC_OR_DEPARTMENT_OTHER): Payer: Self-pay | Admitting: Plastic Surgery

## 2012-12-31 ENCOUNTER — Ambulatory Visit (HOSPITAL_COMMUNITY)
Admission: RE | Admit: 2012-12-31 | Discharge: 2012-12-31 | Disposition: A | Discharge status: Home / Self Care | Payer: 59 | Source: Ambulatory Visit | Attending: General Surgery | Admitting: General Surgery

## 2012-12-31 ENCOUNTER — Ambulatory Visit (HOSPITAL_BASED_OUTPATIENT_CLINIC_OR_DEPARTMENT_OTHER)
Admission: RE | Admit: 2012-12-31 | Discharge: 2013-01-01 | Disposition: A | Discharge status: Home / Self Care | Payer: 59 | Source: Ambulatory Visit | Attending: General Surgery | Admitting: General Surgery

## 2012-12-31 DIAGNOSIS — Z803 Family history of malignant neoplasm of breast: Secondary | ICD-10-CM | POA: Insufficient documentation

## 2012-12-31 DIAGNOSIS — E039 Hypothyroidism, unspecified: Secondary | ICD-10-CM | POA: Insufficient documentation

## 2012-12-31 DIAGNOSIS — J45909 Unspecified asthma, uncomplicated: Secondary | ICD-10-CM | POA: Insufficient documentation

## 2012-12-31 DIAGNOSIS — Z79899 Other long term (current) drug therapy: Secondary | ICD-10-CM | POA: Insufficient documentation

## 2012-12-31 DIAGNOSIS — D0510 Intraductal carcinoma in situ of unspecified breast: Secondary | ICD-10-CM

## 2012-12-31 DIAGNOSIS — K219 Gastro-esophageal reflux disease without esophagitis: Secondary | ICD-10-CM | POA: Insufficient documentation

## 2012-12-31 DIAGNOSIS — C50919 Malignant neoplasm of unspecified site of unspecified female breast: Secondary | ICD-10-CM | POA: Insufficient documentation

## 2012-12-31 DIAGNOSIS — D0590 Unspecified type of carcinoma in situ of unspecified breast: Secondary | ICD-10-CM

## 2012-12-31 HISTORY — DX: Hypothyroidism, unspecified: E03.9

## 2012-12-31 HISTORY — PX: BREAST RECONSTRUCTION WITH PLACEMENT OF TISSUE EXPANDER AND FLEX HD (ACELLULAR HYDRATED DERMIS): SHX6295

## 2012-12-31 HISTORY — PX: TOTAL MASTECTOMY: SHX6129

## 2012-12-31 HISTORY — PX: AXILLARY SENTINEL NODE BIOPSY: SHX5738

## 2012-12-31 SURGERY — MASTECTOMY, SIMPLE
Anesthesia: General | Site: Chest | Laterality: Bilateral | Wound class: Clean

## 2012-12-31 MED ORDER — DIAZEPAM 2 MG PO TABS
2.0000 mg | ORAL_TABLET | Freq: Three times a day (TID) | ORAL | Status: DC | PRN
  Administered 2012-12-31: 2 mg via ORAL

## 2012-12-31 MED ORDER — OXYCODONE HCL 5 MG PO TABS: 5.0000 mg | ORAL_TABLET | ORAL | Status: DC | PRN

## 2012-12-31 MED ORDER — VANCOMYCIN HCL IN DEXTROSE 1-5 GM/200ML-% IV SOLN
1000.0000 mg | INTRAVENOUS | Status: AC
  Administered 2012-12-31: 1000 mg via INTRAVENOUS

## 2012-12-31 MED ORDER — ACETAMINOPHEN 10 MG/ML IV SOLN
1000.0000 mg | Freq: Once | INTRAVENOUS | Status: AC
  Administered 2012-12-31: 1000 mg via INTRAVENOUS

## 2012-12-31 MED ORDER — EPHEDRINE SULFATE 50 MG/ML IJ SOLN
INTRAMUSCULAR | Status: DC | PRN
  Administered 2012-12-31: 5 mg via INTRAVENOUS
  Administered 2012-12-31: 10 mg via INTRAVENOUS
  Administered 2012-12-31: 15 mg via INTRAVENOUS

## 2012-12-31 MED ORDER — HYDROMORPHONE HCL PF 1 MG/ML IJ SOLN
0.2500 mg | INTRAMUSCULAR | Status: DC | PRN
  Administered 2012-12-31 (×4): 0.5 mg via INTRAVENOUS

## 2012-12-31 MED ORDER — FENTANYL CITRATE 0.05 MG/ML IJ SOLN
INTRAMUSCULAR | Status: DC | PRN
  Administered 2012-12-31: 50 ug via INTRAVENOUS
  Administered 2012-12-31: 100 ug via INTRAVENOUS
  Administered 2012-12-31: 50 ug via INTRAVENOUS
  Administered 2012-12-31: 100 ug via INTRAVENOUS
  Administered 2012-12-31: 50 ug via INTRAVENOUS

## 2012-12-31 MED ORDER — PROPOFOL 10 MG/ML IV BOLUS
INTRAVENOUS | Status: DC | PRN
  Administered 2012-12-31: 200 mg via INTRAVENOUS

## 2012-12-31 MED ORDER — ONDANSETRON HCL 4 MG/2ML IJ SOLN: 4.0000 mg | Freq: Four times a day (QID) | INTRAMUSCULAR | Status: DC | PRN

## 2012-12-31 MED ORDER — HYDROCODONE-ACETAMINOPHEN 5-325 MG PO TABS
1.0000 | ORAL_TABLET | ORAL | Status: DC | PRN
  Administered 2012-12-31 (×2): 0.5 via ORAL
  Administered 2013-01-01 (×2): 1 via ORAL

## 2012-12-31 MED ORDER — SODIUM CHLORIDE 0.9 % IR SOLN
Status: DC | PRN
  Administered 2012-12-31: 12:00:00

## 2012-12-31 MED ORDER — SENNOSIDES-DOCUSATE SODIUM 8.6-50 MG PO TABS: 1.0000 | ORAL_TABLET | Freq: Every evening | ORAL | Status: DC | PRN

## 2012-12-31 MED ORDER — SODIUM CHLORIDE 0.9 % IV SOLN: INTRAVENOUS | Status: DC

## 2012-12-31 MED ORDER — OXYCODONE HCL 5 MG/5ML PO SOLN: 5.0000 mg | Freq: Once | ORAL | Status: AC | PRN

## 2012-12-31 MED ORDER — LIDOCAINE HCL (CARDIAC) 20 MG/ML IV SOLN
INTRAVENOUS | Status: DC | PRN
  Administered 2012-12-31: 30 mg via INTRAVENOUS

## 2012-12-31 MED ORDER — FAMOTIDINE 20 MG PO TABS
20.0000 mg | ORAL_TABLET | ORAL | Status: DC | PRN
  Administered 2012-12-31 – 2013-01-01 (×2): 20 mg via ORAL

## 2012-12-31 MED ORDER — MORPHINE SULFATE 2 MG/ML IJ SOLN
2.0000 mg | INTRAMUSCULAR | Status: DC | PRN
  Administered 2012-12-31: 1 mg via INTRAVENOUS
  Administered 2012-12-31 (×2): 0.5 mg via INTRAVENOUS

## 2012-12-31 MED ORDER — 0.9 % SODIUM CHLORIDE (POUR BTL) OPTIME
TOPICAL | Status: DC | PRN
  Administered 2012-12-31: 2000 mL

## 2012-12-31 MED ORDER — LACTATED RINGERS IV SOLN
INTRAVENOUS | Status: DC
  Administered 2012-12-31 (×3): via INTRAVENOUS

## 2012-12-31 MED ORDER — MEPERIDINE HCL 25 MG/ML IJ SOLN: 6.2500 mg | INTRAMUSCULAR | Status: DC | PRN

## 2012-12-31 MED ORDER — ONDANSETRON HCL 4 MG/2ML IJ SOLN
4.0000 mg | Freq: Four times a day (QID) | INTRAMUSCULAR | Status: DC | PRN
  Administered 2012-12-31: 4 mg via INTRAVENOUS

## 2012-12-31 MED ORDER — MORPHINE SULFATE 2 MG/ML IJ SOLN: 2.0000 mg | INTRAMUSCULAR | Status: DC | PRN

## 2012-12-31 MED ORDER — MIDAZOLAM HCL 2 MG/2ML IJ SOLN: 0.5000 mg | Freq: Once | INTRAMUSCULAR | Status: AC | PRN

## 2012-12-31 MED ORDER — ROCURONIUM BROMIDE 100 MG/10ML IV SOLN
INTRAVENOUS | Status: DC | PRN
  Administered 2012-12-31: 40 mg via INTRAVENOUS

## 2012-12-31 MED ORDER — KCL IN DEXTROSE-NACL 20-5-0.45 MEQ/L-%-% IV SOLN
INTRAVENOUS | Status: DC
  Administered 2012-12-31: 100 mL/h via INTRAVENOUS

## 2012-12-31 MED ORDER — CIPROFLOXACIN IN D5W 400 MG/200ML IV SOLN: 400.0000 mg | INTRAVENOUS | Status: DC

## 2012-12-31 MED ORDER — DEXAMETHASONE SODIUM PHOSPHATE 4 MG/ML IJ SOLN
INTRAMUSCULAR | Status: DC | PRN
  Administered 2012-12-31: 8 mg via INTRAVENOUS

## 2012-12-31 MED ORDER — SENNA 8.6 MG PO TABS
1.0000 | ORAL_TABLET | Freq: Two times a day (BID) | ORAL | Status: DC
  Administered 2012-12-31: 8.6 mg via ORAL

## 2012-12-31 MED ORDER — ONDANSETRON HCL 4 MG/2ML IJ SOLN
INTRAMUSCULAR | Status: DC | PRN
  Administered 2012-12-31: 4 mg via INTRAVENOUS

## 2012-12-31 MED ORDER — ACETAMINOPHEN 325 MG PO TABS: 650.0000 mg | ORAL_TABLET | Freq: Four times a day (QID) | ORAL | Status: DC | PRN

## 2012-12-31 MED ORDER — SODIUM CHLORIDE 0.9 % IR SOLN
Status: DC | PRN
  Administered 2012-12-31: 300 mL

## 2012-12-31 MED ORDER — ACETAMINOPHEN 650 MG RE SUPP: 650.0000 mg | Freq: Four times a day (QID) | RECTAL | Status: DC | PRN

## 2012-12-31 MED ORDER — PROMETHAZINE HCL 25 MG/ML IJ SOLN: 6.2500 mg | INTRAMUSCULAR | Status: DC | PRN

## 2012-12-31 MED ORDER — ALBUTEROL SULFATE HFA 108 (90 BASE) MCG/ACT IN AERS: 2.0000 | INHALATION_SPRAY | Freq: Four times a day (QID) | RESPIRATORY_TRACT | Status: DC | PRN

## 2012-12-31 MED ORDER — FENTANYL CITRATE 0.05 MG/ML IJ SOLN
50.0000 ug | INTRAMUSCULAR | Status: DC | PRN
  Administered 2012-12-31: 100 ug via INTRAVENOUS

## 2012-12-31 MED ORDER — OXYCODONE HCL 5 MG PO TABS: 5.0000 mg | ORAL_TABLET | Freq: Once | ORAL | Status: AC | PRN

## 2012-12-31 MED ORDER — MIDAZOLAM HCL 5 MG/5ML IJ SOLN
INTRAMUSCULAR | Status: DC | PRN
  Administered 2012-12-31: 1 mg via INTRAVENOUS

## 2012-12-31 MED ORDER — METHOCARBAMOL 500 MG PO TABS: 500.0000 mg | ORAL_TABLET | Freq: Three times a day (TID) | ORAL | Status: DC | PRN

## 2012-12-31 MED ORDER — MIDAZOLAM HCL 2 MG/2ML IJ SOLN
1.0000 mg | INTRAMUSCULAR | Status: DC | PRN
  Administered 2012-12-31: 2 mg via INTRAVENOUS

## 2012-12-31 MED ORDER — CIPROFLOXACIN IN D5W 400 MG/200ML IV SOLN: 400.0000 mg | Freq: Two times a day (BID) | INTRAVENOUS | Status: DC

## 2012-12-31 MED ORDER — TECHNETIUM TC 99M SULFUR COLLOID FILTERED
1.0000 | Freq: Once | INTRAVENOUS | Status: AC | PRN
  Administered 2012-12-31: 1 via INTRADERMAL

## 2012-12-31 MED ORDER — VANCOMYCIN HCL IN DEXTROSE 1-5 GM/200ML-% IV SOLN
1000.0000 mg | Freq: Two times a day (BID) | INTRAVENOUS | Status: DC
  Administered 2012-12-31: 1000 mg via INTRAVENOUS

## 2012-12-31 SURGICAL SUPPLY — 92 items
APPLIER CLIP 9.375 MED OPEN (MISCELLANEOUS)
BAG DECANTER FOR FLEXI CONT (MISCELLANEOUS) ×2 IMPLANT
BANDAGE GAUZE ELAST BULKY 4 IN (GAUZE/BANDAGES/DRESSINGS) ×4 IMPLANT
BENZOIN TINCTURE PRP APPL 2/3 (GAUZE/BANDAGES/DRESSINGS) IMPLANT
BINDER BREAST LRG (GAUZE/BANDAGES/DRESSINGS) ×2 IMPLANT
BINDER BREAST MEDIUM (GAUZE/BANDAGES/DRESSINGS) IMPLANT
BINDER BREAST XLRG (GAUZE/BANDAGES/DRESSINGS) IMPLANT
BINDER BREAST XXLRG (GAUZE/BANDAGES/DRESSINGS) IMPLANT
BIOPATCH BLUE 3/4IN DISK W/1.5 (GAUZE/BANDAGES/DRESSINGS) ×4 IMPLANT
BLADE HEX COATED 2.75 (ELECTRODE) ×2 IMPLANT
BLADE SURG 15 STRL LF DISP TIS (BLADE) ×4 IMPLANT
BLADE SURG 15 STRL SS (BLADE) ×4
BNDG COHESIVE 4X5 TAN STRL (GAUZE/BANDAGES/DRESSINGS) IMPLANT
CANISTER SUCTION 1200CC (MISCELLANEOUS) ×8 IMPLANT
CHLORAPREP W/TINT 26ML (MISCELLANEOUS) ×2 IMPLANT
CLIP APPLIE 9.375 MED OPEN (MISCELLANEOUS) IMPLANT
CLOTH BEACON ORANGE TIMEOUT ST (SAFETY) ×2 IMPLANT
CORDS BIPOLAR (ELECTRODE) IMPLANT
COVER MAYO STAND STRL (DRAPES) ×4 IMPLANT
COVER PROBE W GEL 5X96 (DRAPES) ×2 IMPLANT
COVER TABLE BACK 60X90 (DRAPES) ×4 IMPLANT
DECANTER SPIKE VIAL GLASS SM (MISCELLANEOUS) IMPLANT
DERMABOND ADVANCED (GAUZE/BANDAGES/DRESSINGS) ×6
DERMABOND ADVANCED .7 DNX12 (GAUZE/BANDAGES/DRESSINGS) ×6 IMPLANT
DRAIN CHANNEL 19F RND (DRAIN) ×4 IMPLANT
DRAPE LAPAROSCOPIC ABDOMINAL (DRAPES) ×2 IMPLANT
DRAPE U-SHAPE 76X120 STRL (DRAPES) IMPLANT
DRSG PAD ABDOMINAL 8X10 ST (GAUZE/BANDAGES/DRESSINGS) IMPLANT
DRSG TEGADERM 2-3/8X2-3/4 SM (GAUZE/BANDAGES/DRESSINGS) IMPLANT
DRSG TEGADERM 4X4.75 (GAUZE/BANDAGES/DRESSINGS) IMPLANT
ELECT BLADE 4.0 EZ CLEAN MEGAD (MISCELLANEOUS) ×4
ELECT COATED BLADE 2.86 ST (ELECTRODE) ×2 IMPLANT
ELECT REM PT RETURN 9FT ADLT (ELECTROSURGICAL) ×4
ELECTRODE BLDE 4.0 EZ CLN MEGD (MISCELLANEOUS) ×2 IMPLANT
ELECTRODE REM PT RTRN 9FT ADLT (ELECTROSURGICAL) ×2 IMPLANT
EVACUATOR SILICONE 100CC (DRAIN) ×4 IMPLANT
GLOVE BIO SURGEON STRL SZ 6.5 (GLOVE) ×10 IMPLANT
GLOVE BIO SURGEON STRL SZ7 (GLOVE) ×2 IMPLANT
GLOVE BIOGEL PI IND STRL 7.0 (GLOVE) ×3 IMPLANT
GLOVE BIOGEL PI IND STRL 7.5 (GLOVE) ×1 IMPLANT
GLOVE BIOGEL PI INDICATOR 7.0 (GLOVE) ×3
GLOVE BIOGEL PI INDICATOR 7.5 (GLOVE) ×1
GLOVE ECLIPSE 7.0 STRL STRAW (GLOVE) ×4 IMPLANT
GLOVE INDICATOR 7.0 STRL GRN (GLOVE) ×6 IMPLANT
GLOVE SKINSENSE NS SZ6.5 (GLOVE)
GLOVE SKINSENSE STRL SZ6.5 (GLOVE) IMPLANT
GOWN PREVENTION PLUS XLARGE (GOWN DISPOSABLE) ×24 IMPLANT
GRAFT FLEX HD 4X16 THICK (Tissue Mesh) ×2 IMPLANT
GRAFT FLEX HD BILAT 4X16 THICK (Tissue Mesh) ×2 IMPLANT
IV NS 1000ML (IV SOLUTION) ×1
IV NS 1000ML BAXH (IV SOLUTION) ×1 IMPLANT
IV NS 500ML (IV SOLUTION)
IV NS 500ML BAXH (IV SOLUTION) IMPLANT
KIT FILL MCGHAN 30CC (MISCELLANEOUS) ×2 IMPLANT
NEEDLE HYPO 25X1 1.5 SAFETY (NEEDLE) IMPLANT
NS IRRIG 1000ML POUR BTL (IV SOLUTION) ×16 IMPLANT
PACK BASIN DAY SURGERY FS (CUSTOM PROCEDURE TRAY) ×4 IMPLANT
PENCIL BUTTON HOLSTER BLD 10FT (ELECTRODE) ×4 IMPLANT
PIN SAFETY STERILE (MISCELLANEOUS) ×2 IMPLANT
RUBBERBAND STERILE (MISCELLANEOUS) ×2 IMPLANT
SET ASEPTIC TRANSFER (MISCELLANEOUS) ×2 IMPLANT
SLEEVE SCD COMPRESS KNEE MED (MISCELLANEOUS) ×2 IMPLANT
SPONGE GAUZE 4X4 12PLY (GAUZE/BANDAGES/DRESSINGS) ×2 IMPLANT
SPONGE LAP 18X18 X RAY DECT (DISPOSABLE) ×10 IMPLANT
SPONGE LAP 4X18 X RAY DECT (DISPOSABLE) ×2 IMPLANT
STAPLER VISISTAT 35W (STAPLE) ×2 IMPLANT
STOCKINETTE IMPERVIOUS LG (DRAPES) IMPLANT
STRIP CLOSURE SKIN 1/2X4 (GAUZE/BANDAGES/DRESSINGS) IMPLANT
SUT MNCRL AB 4-0 PS2 18 (SUTURE) IMPLANT
SUT MON AB 5-0 PS2 18 (SUTURE) ×4 IMPLANT
SUT PDS 3-0 CT2 (SUTURE)
SUT PDS AB 2-0 CT2 27 (SUTURE) ×6 IMPLANT
SUT PDS II 3-0 CT2 27 ABS (SUTURE) IMPLANT
SUT SILK 3 0 PS 1 (SUTURE) ×4 IMPLANT
SUT SILK 3 0 SH 30 (SUTURE) ×2 IMPLANT
SUT VIC AB 2-0 SH 27 (SUTURE)
SUT VIC AB 2-0 SH 27XBRD (SUTURE) IMPLANT
SUT VIC AB 3-0 SH 27 (SUTURE) ×2
SUT VIC AB 3-0 SH 27X BRD (SUTURE) ×2 IMPLANT
SUT VICRYL 3-0 CR8 SH (SUTURE) ×2 IMPLANT
SUT VICRYL 4-0 PS2 18IN ABS (SUTURE) ×4 IMPLANT
SUT VICRYL AB 3 0 TIES (SUTURE) IMPLANT
SYR 50ML LL SCALE MARK (SYRINGE) IMPLANT
SYR BULB IRRIGATION 50ML (SYRINGE) IMPLANT
SYR CONTROL 10ML LL (SYRINGE) IMPLANT
TISSUE EXPANDER 350CC (Miscellaneous) ×4 IMPLANT
TOWEL OR 17X24 6PK STRL BLUE (TOWEL DISPOSABLE) ×6 IMPLANT
TOWEL OR NON WOVEN STRL DISP B (DISPOSABLE) IMPLANT
TUBE CONNECTING 20X1/4 (TUBING) ×2 IMPLANT
UNDERPAD 30X30 INCONTINENT (UNDERPADS AND DIAPERS) IMPLANT
WATER STERILE IRR 1000ML POUR (IV SOLUTION) IMPLANT
YANKAUER SUCT BULB TIP NO VENT (SUCTIONS) ×6 IMPLANT

## 2012-12-31 NOTE — Anesthesia Procedure Notes (Signed)
Procedure Name: Intubation Date/Time: 12/31/2012 10:24 AM Performed by: Verlan Friends Pre-anesthesia Checklist: Patient identified, Emergency Drugs available, Suction available, Patient being monitored and Timeout performed Patient Re-evaluated:Patient Re-evaluated prior to inductionOxygen Delivery Method: Circle System Utilized Preoxygenation: Pre-oxygenation with 100% oxygen Intubation Type: IV induction Ventilation: Mask ventilation without difficulty Laryngoscope Size: Miller and 3 Grade View: Grade I Tube type: Oral Tube size: 7.0 mm Number of attempts: 1 Airway Equipment and Method: stylet Placement Confirmation: ETT inserted through vocal cords under direct vision,  positive ETCO2 and breath sounds checked- equal and bilateral Secured at: 20 cm Tube secured with: Tape Dental Injury: Teeth and Oropharynx as per pre-operative assessment  Comments: Patient has maxillary and mandibular implants.  Teeth were untouched during intubation.

## 2012-12-31 NOTE — Interval H&P Note (Signed)
History and Physical Interval Note:  12/31/2012 9:22 AM  Jane Gutierrez  has presented today for surgery, with the diagnosis of bilateral total mastectomies, left sentinel node biopsy  The various methods of treatment have been discussed with the patient and family. After consideration of risks, benefits and other options for treatment, the patient has consented to  Procedure(s) (LRB) with comments: AXILLARY SENTINEL NODE BIOPSY (Bilateral) - bilateral sentinel node TOTAL MASTECTOMY (Bilateral) - bilateral total mastectomies BREAST RECONSTRUCTION WITH PLACEMENT OF TISSUE EXPANDER AND FLEX HD (ACELLULAR HYDRATED DERMIS) (Bilateral) - bilateral immediate breast reconstruction with expanders and flex hd  as a surgical intervention .  The patient's history has been reviewed, patient examined, no change in status, stable for surgery.  I have reviewed the patient's chart and labs.  Questions were answered to the patient's satisfaction.     SANGER,CLAIRE

## 2012-12-31 NOTE — Op Note (Signed)
Preoperative diagnoses: #1 left breast ductal carcinoma in situ, clinical stage 0  #2 right breast invasive ductal carcinoma, clinical stage I Postoperative diagnoses: Same as above Procedure: #1 right total mastectomy #2 right axillary sentinel lymph node biopsy #3 left total mastectomy #4 left axillary sentinel lymph node biopsy Surgeon: Dr. Harden Mo Anesthesia: Gen. Specimens: #1 right breast tissue marked short stitch superior, long stitch lateral, skin vertical #2 left breast tissue marked short stitch superior, long stitch lateral,  skin vertical #3 right axillary sentinel lymph node with count of 504 #4 left axillary sentinel lymph node with count of 209 Complications: None Disposition of case turned over to plastic surgery for reconstruction Sponge and needle count was correct at end of my portion  Indications: This a 57 year old female with a significant family history of breast cancer. She was diagnosed with a left breast ductal carcinoma in situ after undergoing a screening mammogram. On MRI she was noted to have a right breast abnormality that was biopsy showed invasive ductal carcinoma. Due to her family history and genetics pending she very much wanted to undergo bilateral mastectomy. We discussed a bilateral mastectomy with bilateral axillary sentinel lymph node biopsies. She is due to undergo immediate reconstruction with expanders.  Procedure: After informed consent was obtained the patient was taken to the operating room. She was administered vancomycin. Sequential compression devices were placed. She then underwent general endotracheal anesthesia without complication. A Foley catheter was inserted. Her bilateral breasts were then prepped and draped in the standard sterile surgical fashion. The surgical timeout was then performed.  I approached the right breast first. I used a vertical incision to include her mastopexy and reduction incisions from before. I made this  in an elliptical fashion vertically to include the nipple areolar complex. Flaps were then created to the sternum, clavicle, latissimus, and inframammary crease. The breast was then removed from the pectoralis muscle including the fascia. I then used a neoprobe to identify one sentinel lymph node that was removed with the counts as listed above. Hemostasis was then obtained. Irrigation was performed. This portion was then turned over to Dr. Kelly Splinter for reconstruction. I then went to the right side.  Again I made a vertical incision to include the prior incisions in the nipple areolar complex. Flaps were created in a similar fashion. I then removed the breast in a similar fashion. This was then marked as above and passed off the table. I identified one sentinel lymph node on this side with the counts as listed above. There is no background radioactivity on either side after removing sentinel lymph node. Irrigation was performed, hemostasis was observed. I then turned the case over to Dr. Kelly Splinter for reconstruction completely.

## 2012-12-31 NOTE — H&P (View-Only) (Signed)
Patient ID: Jane Gutierrez, female   DOB: 1956/10/21, 57 y.o.   MRN: [id number]  Chief Complaint  Patient presents with  . New Evaluation    Lft Breast    HPI Jane Gutierrez is a 57 y.o. female.  Referred by Dr. Jean Rosenthal HPI 57 yo healthy female who works in Insurance account manager at [employer]. She has significant family history of breast cancer in her twin sister when she was 39 yo as well as her maternal grandmother at age 30  She has undergone previous bilateral mastopexy.  She has also had a benign breast lesion removed on left as well as prior stereotactic biopsy. She underwent routine screening mammogram recently with finding of at least 5.4 cm of calcifications.  She has an MR pending currently. She actually had one in 2008 that she said was normal.  A biopsy of these calcifications was obtained that showed high grade DCIS that is er positive at 100% and pr positive at 86%.  She comes in today to discuss her options.  She has thought a lot about this prior to our visit and she was considering bilateral mastectomies upon arrival.  Past Medical History  Diagnosis Date  . IBS (irritable bowel syndrome)   . Thyroid disease   . ADD (attention deficit disorder)   . Lyme disease   . Allergy   . Cancer   . Asthma     Past Surgical History  Procedure Date  . Abdominal hysterectomy   . Breast surgery   . Cosmetic surgery   . Brain surgery     Family History  Problem Relation Age of Onset  . Cancer Mother   . CVA Father   . Cancer Sister     Social History History  Substance Use Topics  . Smoking status: Never Smoker   . Smokeless tobacco: Not on file  . Alcohol Use: Yes     Comment: socially    Allergies  Allergen Reactions  . Iodine     REACTION: anaphylactic  . Milk-Related Compounds   . Penicillins   . Shellfish-Derived Products   . Sulfamethoxazole W-Trimethoprim     Current Outpatient Prescriptions  Medication Sig Dispense Refill  . albuterol (PROVENTIL HFA;VENTOLIN  HFA) 108 (90 BASE) MCG/ACT inhaler Inhale 1-2 puffs into the lungs every 6 (six) hours as needed for wheezing. Dispense with aerochamber  1 Inhaler  0  . Azelastine-Fluticasone (DYMISTA) 137-50 MCG/ACT SUSP Place into the nose.      . Cholecalciferol (VITAMIN D) 2000 UNITS CAPS Take 1 capsule by mouth daily.      [name] levothyroxine (SYNTHROID, LEVOTHROID) 100 MCG tablet Take 100 mcg by mouth daily.      [name] azithromycin (ZITHROMAX) 250 MG tablet Take 2 pills by mouth on day 1, then 1 pill by mouth per day on days 2 through 5.  6 each  0  . benzonatate (TESSALON PERLES) 100 MG capsule Take 1 capsule (100 mg total) by mouth 3 (three) times daily as needed for cough.  20 capsule  0  . famotidine (PEPCID) 20 MG tablet Take 1 tablet (20 mg total) by mouth 2 (two) times daily.  40 tablet  0  . fluticasone (FLONASE) 50 MCG/ACT nasal spray Place 2 sprays into the nose daily.  16 g  2  . HYDROcodone-homatropine (HYCODAN) 5-1.5 MG/5ML syrup 26m by mouth a bedtime as needed for cough.  120 mL  0  . ipratropium-albuterol (DUONEB) 0.5-2.5 (3) MG/3ML SOLN Take 3 mLs  by nebulization every 4 (four) hours as needed.  360 mL  1  . levofloxacin (LEVAQUIN) 500 MG tablet Take 500 mg by mouth daily.      . naproxen (NAPROSYN) 500 MG tablet Take 1 tablet (500 mg total) by mouth 2 (two) times daily.  20 tablet  0  . pantoprazole (PROTONIX) 40 MG tablet Take 1 tablet (40 mg total) by mouth daily.  20 tablet  0  . sucralfate (CARAFATE) 1 GM/10ML suspension Take 10 mLs (1 g total) by mouth 4 (four) times daily. 10 mL before meals and before bedtime  240 mL  0  . [DISCONTINUED] dextroamphetamine (DEXEDRINE SPANSULE) 5 MG 24 hr capsule Take 5 mg by mouth daily.      . [DISCONTINUED] Eszopiclone (LUNESTA PO) Take by mouth.        Review of Systems Review of Systems  Constitutional: Negative for fever, chills and unexpected weight change.  HENT: Negative for hearing loss, congestion, sore throat, trouble swallowing and voice  change.   Eyes: Negative for visual disturbance.  Respiratory: Negative for cough and wheezing.   Cardiovascular: Negative for chest pain, palpitations and leg swelling.  Gastrointestinal: Negative for nausea, vomiting, abdominal pain, diarrhea, constipation, blood in stool, abdominal distention and anal bleeding.  Genitourinary: Negative for hematuria, vaginal bleeding and difficulty urinating.  Musculoskeletal: Negative for arthralgias.  Skin: Negative for rash and wound.  Neurological: Negative for seizures, syncope and headaches.  Hematological: Negative for adenopathy. Does not bruise/bleed easily.  Psychiatric/Behavioral: Negative for confusion.    Blood pressure 132/80, pulse 62, temperature 97 F (36.1 C), temperature source Temporal, resp. rate 18, height 5\' 5"  (1.651 m), weight 161 lb 12.8 oz (73.392 kg).  Physical Exam Physical Exam  Vitals reviewed. Constitutional: She appears well-developed and well-nourished.  Cardiovascular: Normal rate, regular rhythm and normal heart sounds.   Pulmonary/Chest: Effort normal and breath sounds normal. She has no wheezes. She has no rales. Right breast exhibits no inverted nipple, no mass, no nipple discharge, no skin change and no tenderness. Left breast exhibits no inverted nipple, no mass, no nipple discharge, no skin change and no tenderness. Breasts are symmetrical.    Abdominal: Soft.  Lymphadenopathy:    She has no cervical adenopathy.    Data Reviewed DIGITAL DIAGNOSTIC LEFT BREAST MAMMOGRAM  Comparison: Prior studies  Findings:  ACR Breast Density Category 2: There is a scattered fibroglandular  pattern.  Magnification views of the left breast demonstrate pleomorphic  microcalcifications located in a segmental distribution within the  upper-outer quadrant of the left breast. These extend for 5.3 cm  in maximal distance. Tissue sampling is recommended. I have  discussed stereotactic core biopsy of the calcifications with  the  patient. The patient would like to proceed. This will be  performed and reported separately.  IMPRESSION:  Pleomorphic calcifications located within the upper-outer quadrant  of the left breast which extend for 5.3 cm in diameter. Tissue  sampling is recommended and stereotactic core biopsy will be  performed and reported separately.  RECOMMENDATION:  Left breast stereotactic biopsy.  I have discussed the findings and recommendations with the patient.  Results were also provided in writing at the conclusion of the  visit.  BI-RADS CATEGORY 4: Suspicious abnormality - biopsy should be  considered.   Assessment    Stage 0 Left breast cancer  Significant family history breast cancer, possible ovarian cancer    Plan    1. Genetics referral- she says her sister was negative for  BRCA testing 12 years ago but testing has certainly changed since then and I think a visit with genetics will be beneficial not just for her but for her daughter and sisters also.  This will not be important for surgical decision making. 2.  MRI pending to assess nodes and right side to ensure no mammographically occult cancer given her history and planned surgery. 3.   We discussed the staging and pathophysiology of breast cancer. We discussed all of the different options for treatment for breast cancer including surgery, chemotherapy, radiation therapy, Herceptin, and antiestrogen therapy.   We discussed a sentinel lymph node biopsy as she does not appear to having lymph node involvement right now. We discussed the performance of that with injection of radioactive tracer and blue dye. We discussed that she would have an incision underneath her axillary hairline. We discussed that there is a bout a 10-20% chance of having a positive node with a sentinel lymph node biopsy and we will await the permanent pathology to make any other first further decisions in terms of her treatment. One of these options might be to  return to the operating room to perform an axillary lymph node dissection. We discussed about a 1-2% risk lifetime of chronic shoulder pain as well as lymphedema associated with a sentinel lymph node biopsy.  We discussed the options for treatment of the breast cancer which included lumpectomy versus a mastectomy. We discussed the performance of the lumpectomy with a wire placement. We discussed a 20% chance of a positive margin requiring reexcision in the operating room. I think a lumpectomy is a possibility despite the size of these calcifications.  We also discussed that she may need radiation therapy or antiestrogen therapy or both if she undergoes lumpectomy. She very much would like to undergone mastectomy and would like to have bilateral mastectomy even with negative genetics.  We discussed that this is not a 100% prevention of cancer.  I think with her family history this certainly is reasonable thought as well as for symmetry.  We discussed the mastectomy and the postoperative care for that as well. We discussed that there is no difference in her survival whether she undergoes lumpectomy with radiation therapy or antiestrogen therapy versus a mastectomy. There is a slight difference in the local recurrence rate being 3-5% with lumpectomy and about 1% with a mastectomy. We discussed the risks of operation including bleeding, infection, possible reoperation. She understands her further therapy will be based on what her stages at the time of her operation. We also discussed immediate reconstruction for which I think she is a good candidate.  I have referred her to Dr Kelly Splinter and we will plan on combined surgery pending her evaluation.         Jane Gutierrez 12/14/2012, 4:47 PM

## 2012-12-31 NOTE — Progress Notes (Signed)
Emotional support during breast injections °

## 2012-12-31 NOTE — Transfer of Care (Signed)
Immediate Anesthesia Transfer of Care Note  Patient: Keeleigh G Hull  Procedure(s) Performed: Procedure(s) (LRB) with comments: AXILLARY SENTINEL NODE BIOPSY (Bilateral) - bilateral sentinel node TOTAL MASTECTOMY (Bilateral) - bilateral total mastectomies BREAST RECONSTRUCTION WITH PLACEMENT OF TISSUE EXPANDER AND FLEX HD (ACELLULAR HYDRATED DERMIS) (Bilateral) - bilateral immediate breast reconstruction with expanders and flex hd   Patient Location: PACU  Anesthesia Type:General  Level of Consciousness: sedated  Airway & Oxygen Therapy: Patient Spontanous Breathing and Patient connected to face mask oxygen  Post-op Assessment: Report given to PACU RN and Post -op Vital signs reviewed and stable  Post vital signs: Reviewed and stable  Complications: No apparent anesthesia complications

## 2012-12-31 NOTE — Anesthesia Preprocedure Evaluation (Signed)
Anesthesia Evaluation  Patient identified by MRN, date of birth, ID band Patient awake    Reviewed: Allergy & Precautions, H&P , NPO status , Patient's Chart, lab work & pertinent test results  History of Anesthesia Complications (+) PONV  Airway Mallampati: II TM Distance: >3 FB Neck ROM: Full    Dental  (+) Teeth Intact, Implants, Caps and Dental Advisory Given   Pulmonary asthma (rarely needs inhaler ) ,  breath sounds clear to auscultation  Pulmonary exam normal       Cardiovascular negative cardio ROS  Rhythm:Regular Rate:Normal     Neuro/Psych negative neurological ROS     GI/Hepatic Neg liver ROS, GERD-  Medicated and Controlled,  Endo/Other  Hypothyroidism (controlled)   Renal/GU negative Renal ROS     Musculoskeletal   Abdominal   Peds  Hematology   Anesthesia Other Findings   Reproductive/Obstetrics                           Anesthesia Physical Anesthesia Plan  ASA: II  Anesthesia Plan: General   Post-op Pain Management:    Induction: Intravenous  Airway Management Planned: Oral ETT  Additional Equipment:   Intra-op Plan:   Post-operative Plan: Extubation in OR  Informed Consent: I have reviewed the patients History and Physical, chart, labs and discussed the procedure including the risks, benefits and alternatives for the proposed anesthesia with the patient or authorized representative who has indicated his/her understanding and acceptance.   Dental advisory given  Plan Discussed with: CRNA and Surgeon  Anesthesia Plan Comments: (Plan routine monitors, GETA)        Anesthesia Quick Evaluation

## 2012-12-31 NOTE — Interval H&P Note (Signed)
History and Physical Interval Note:  12/31/2012 11:03 AM  Jane Gutierrez  has presented today for surgery, with the diagnosis of bilateral total mastectomies, left sentinel node biopsy  The various methods of treatment have been discussed with the patient and family. After consideration of risks, benefits and other options for treatment, the patient has consented to  Procedure(s) (LRB) with comments: AXILLARY SENTINEL NODE BIOPSY (Bilateral) - bilateral sentinel node TOTAL MASTECTOMY (Bilateral) - bilateral total mastectomies BREAST RECONSTRUCTION WITH PLACEMENT OF TISSUE EXPANDER AND FLEX HD (ACELLULAR HYDRATED DERMIS) (Bilateral) - bilateral immediate breast reconstruction with expanders and flex hd  as a surgical intervention .  The patient's history has been reviewed, patient examined, no change in status, stable for surgery.  I have reviewed the patient's chart and labs.  Questions were answered to the patient's satisfaction.     SANGER,CLAIRE

## 2012-12-31 NOTE — Brief Op Note (Signed)
12/31/2012  1:30 PM  PATIENT:  Jane Gutierrez Shown  57 y.o. female  PRE-OPERATIVE DIAGNOSIS:  Bilateral Breast Cancer  POST-OPERATIVE DIAGNOSIS:  Bilateral Breast Cancer  PROCEDURE:  Procedure(s) (LRB) with comments: AXILLARY SENTINEL NODE BIOPSY (Bilateral) - bilateral sentinel node TOTAL MASTECTOMY (Bilateral) - bilateral total mastectomies BREAST RECONSTRUCTION WITH PLACEMENT OF TISSUE EXPANDER AND FLEX HD (ACELLULAR HYDRATED DERMIS) (Bilateral) - bilateral immediate breast reconstruction with expanders and flex hd   SURGEON:  Surgeon(s) and Role: Panel 1:    * Emelia Loron, MD - Primary  Panel 2:    * Claire Sanger, DO - Primary  PHYSICIAN ASSISTANT: Shawn Rayburn, PA  ASSISTANTS: none   ANESTHESIA:   general  EBL:  Total I/O In: 2400 [I.V.:2400] Out: 120 [Urine:120]  BLOOD ADMINISTERED:none  DRAINS: (2) Jackson-Pratt drain(s) with closed bulb suction in the one in each breast pocket   LOCAL MEDICATIONS USED:  NONE  SPECIMEN:  No Specimen  DISPOSITION OF SPECIMEN:  N/A  COUNTS:  YES  TOURNIQUET:  * No tourniquets in log *  DICTATION: dictated  PLAN OF CARE: Admit for overnight observation  PATIENT DISPOSITION:  PACU - hemodynamically stable.   Delay start of Pharmacological VTE agent (>24hrs) due to surgical blood loss or risk of bleeding: no

## 2012-12-31 NOTE — H&P (View-Only) (Signed)
  This document contains confidential information from a [facility] medical record system and may be unauthenticated. Release may be made only with a valid authorization or in accordance with applicable policies of Medical Center or its affiliates. This document must be maintained in a secure manner or discarded/destroyed as required by Medical Center policy or by a confidential means such as shredding.   Jane Gutierrez   12/25/2012 10:00 AM Office Visit  MRN: [id number]  Department: Plastic Surgery  Dept Phone: [phone]  Description: Female DOB: 12/14/1956  Provider: Shawn Montgomery Rayburn, PA-C   Diagnoses  -  Breast cancer   - Primary   Vitals - Last Recorded     135/83  82           Subjective:    Patient ID: Jane Gutierrez is a 56 y.o. female.  HPI The patient is a 56 yrs old wf here for pre-operative history and physical for breast reconstruction.    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 She has significant family history of breast cancer in her twin sister when she was 44 yrs old and her maternal grandmother at age 68.  She undergone a bilateral mastopexy in 2007 at [facility]. She has also had a benign breast lesion removed on the left.  A routine screening mammogram showed a  5.4 cm area of calcification on the left breast. The MR showed a large segmental area of abnormal enhancement in the left upper outer quadrant consistent with the biopsy-proven DCIS.  The right breast showed a 6 mm irregular enhancing area central/12 o'clock for which MRI guided right breast biopsy is recommended.  Her mammagram in 2008 was normal. A biopsy of these calcifications was obtained that showed high grade DCIS that is ER/PR positive. She is 5 feet 5 inches tall and weighs 165 pounds. Her bra is a 36 B-C and she would like to be around a B.  She also had a fronal lobe surgery due to a benign mass, face lift and a hysterectomy.   She had a MRI with biopsy of the right breast lesion this am and is  awaiting pathology for this.   The following portions of the patient's history were reviewed and updated as appropriate: allergies, current medications, past family history, past medical history, past social history, past surgical history and problem list.  Review of Systems  Constitutional: Negative.   HENT: Negative.   Eyes: Negative.   Respiratory: Negative.   Cardiovascular: Negative.   Gastrointestinal: Negative.   Genitourinary: Negative.   Musculoskeletal: Negative.   Hematological: Negative.   Psychiatric/Behavioral: Negative.    Objective:    Physical Exam  Constitutional: She appears well-developed.  HENT:   Head: Normocephalic and atraumatic.  Eyes: Conjunctivae normal and EOM are normal. Pupils are equal, round, and reactive to light.  Neck: Normal range of motion.  Cardiovascular: Normal rate.   Pulmonary/Chest: Effort normal.   Abdominal: Soft. She exhibits no distension. There is no tenderness.  Musculoskeletal: Normal range of motion.  Neurological: She is alert.  Skin: Skin is warm.  Psychiatric: She has a normal mood and affect. Her behavior is normal. Judgment and thought content normal.      Assessment:      1.  Breast cancer       Plan:   Plan to proceed with bilateral mastectomies per Dr. [place] then immediate expander and Flex HD placement per Dr. Sanger. The procedure and risks and benefits were   discussed with the patient and she wishes to proceed. Consent was obtained.   Medications Ordered This Encounter     cephalexin (KEFLEX) 500 MG capsule Take 1 capsule (500 mg total) by mouth 4 times daily.    HYDROcodone-acetaminophen (NORCO) 5-325 mg per  Take 1 tablet by mouth every 6 (six) hours as needed for 10 days for Pain.    diazepam (VALIUM) 2 MG tablet Take 1 tablet (2 mg total) by mouth every 6 (six) hours as needed for 10 days for Anxiety.    promethazine (PHENERGAN) 12.5 MG tablet 30 Take 1 tablet (12.5 mg total) by mouth every 6 (six) hours  as needed for 7 days for Nausea.      

## 2012-12-31 NOTE — Anesthesia Postprocedure Evaluation (Signed)
  Anesthesia Post-op Note  Patient: Orlene G Hull  Procedure(s) Performed: Procedure(s) (LRB) with comments: AXILLARY SENTINEL NODE BIOPSY (Bilateral) - bilateral sentinel node TOTAL MASTECTOMY (Bilateral) - bilateral total mastectomies BREAST RECONSTRUCTION WITH PLACEMENT OF TISSUE EXPANDER AND FLEX HD (ACELLULAR HYDRATED DERMIS) (Bilateral) - bilateral immediate breast reconstruction with expanders and flex hd   Patient Location: PACU  Anesthesia Type:General  Level of Consciousness: awake, alert , oriented and patient cooperative  Airway and Oxygen Therapy: Patient Spontanous Breathing and Patient connected to nasal cannula oxygen  Post-op Pain: mild  Post-op Assessment: Post-op Vital signs reviewed, Patient's Cardiovascular Status Stable, Respiratory Function Stable, Patent Airway, No signs of Nausea or vomiting and Pain level controlled  Post-op Vital Signs: Reviewed and stable  Complications: No apparent anesthesia complications

## 2012-12-31 NOTE — Interval H&P Note (Signed)
History and Physical Interval Note:  12/31/2012 10:03 AM She underwent biopsy of right sided mr lesion and this is idc with dcis.  I have discussed need for sentinel node on this side and we will proceed with that. Jane Gutierrez  has presented today for surgery, with the diagnosis of bilateral total mastectomies, left sentinel node biopsy  The various methods of treatment have been discussed with the patient and family. After consideration of risks, benefits and other options for treatment, the patient has consented to  Procedure(s) (LRB) with comments: AXILLARY SENTINEL NODE BIOPSY (Bilateral) - bilateral sentinel node TOTAL MASTECTOMY (Bilateral) - bilateral total mastectomies BREAST RECONSTRUCTION WITH PLACEMENT OF TISSUE EXPANDER AND FLEX HD (ACELLULAR HYDRATED DERMIS) (Bilateral) - bilateral immediate breast reconstruction with expanders and flex hd  as a surgical intervention .  The patient's history has been reviewed, patient examined, no change in status, stable for surgery.  I have reviewed the patient's chart and labs.  Questions were answered to the patient's satisfaction.     Jane Gutierrez

## 2013-01-01 ENCOUNTER — Encounter (HOSPITAL_BASED_OUTPATIENT_CLINIC_OR_DEPARTMENT_OTHER): Payer: Self-pay | Admitting: General Surgery

## 2013-01-01 NOTE — Op Note (Signed)
Jane Gutierrez, Jane Gutierrez                   ACCOUNT NO.:  [id number]  MEDICAL RECORD NO.:  [id number]  [facility]            FACILITY:  [facility]  PHYSICIAN:  Wayland Denis, DO      DATE OF BIRTH:  10-26-1956  DATE OF PROCEDURE:  12/31/2012 DATE OF DISCHARGE:                              OPERATIVE REPORT   POSTOPERATIVE DIAGNOSIS:  Bilateral breast cancer.  POSTOPERATIVE DIAGNOSIS:  Bilateral breast cancer.  PROCEDURE:  Immediate bilateral breast reconstruction with expander and Flex HD placement, expander 350 medium height mentor expander and 4 x 16 Flex HD.  ATTENDING SURGEON:  Wayland Denis, DO.  ASSISTANT:  Shawn Rayburn, P.A.  ANESTHESIA:  General.  INDICATIONS FOR PROCEDURE:  The patient is a 57 year old female who was diagnosed with bilateral breast cancer and opted for mastectomies with immediate reconstruction.  She has had breast surgery in the past for mastopexy and therefore had vertical and horizontal scars.  Risks and complications were reviewed and included bleeding, pain, scar, risk of anesthesia, capsular contracture and implant exposure.  She wished for this procedure after a full discussion.  Consent was signed and confirmed.  DESCRIPTION OF PROCEDURE:  The patient was taken to the operating room by General Surgery and bilateral mastectomies were performed.  The patient was placed on the operating room table in a supine position. After General Surgery was finished with their portion of the case, the patient was rendered to the Plastic Reconstructive Surgery team.  A time- out was called.  All information was confirmed to be correct.  She was redraped and we began on the left.  The pectoralis major muscle was lifted off the chest wall.  Using electrocautery, hemostasis was achieved with the electrocautery and the pocket was irrigated with antibiotic solution.  The expander was prepared according to the manufacturer's guidelines.  The mentor  medium height 350 mL expander was selected.  The air was evacuated and the expander was placed in the pocket and tacked at the inframammary fold.  The Flex HD was prepared according to the manufacture guidelines.  A 4 x 16 piece was selected. After it was ready, the Flex HD was placed in the pocket with the porous side superficial and the shiny side deep.  It was attached to the edge of the pectoralis muscle with 2-0 PDS and to the inframammary fold or right below it with 2-0 PDS and then laterally.  A 15 blade was used to make a stab incision and the drain was placed and tacked to the skin with a 3-0 silk.  The deep layers were then closed with 3-0 Vicryl followed by 4-0 Vicryl and a running subcuticular 5-0 Monocryl was used to close the skin.  The same exact procedure was done on the opposite side.  The patient tolerated the procedure well. There were no complications.  Final patches were placed around the drain with 4 x 4.  Dermabond was placed on the skin followed by ABDs and a large breast binder.  The patient was allowed to wake up, extubated and taken to recovery room in stable condition.  A 150 mL of injectable saline was placed in both expanders.     Wayland Denis, DO     CS/MEDQ  D:  12/31/2012  T:  01/01/2013  Job:  [id number]

## 2013-01-04 ENCOUNTER — Encounter: Payer: Self-pay | Admitting: *Deleted

## 2013-01-04 NOTE — Progress Notes (Signed)
Jane Gutierrez [id number] 1956/07/30 57 y.o. 01/04/2013 11:09 PM  CC  [name], MD [address] [place] [address] Dr. Emelia Loron  REASON FOR CONSULTATION:  57 year old female with high-grade DCIS of the left breast. Patient is seen in medical oncology for discussion of treatment options  STAGE:  Tis NX MX (stage 0) high-grade DCIS left breast  REFERRING PHYSICIAN: Dr. Emelia Loron  HISTORY OF PRESENT ILLNESS:  Jane Gutierrez is a 57 y.o. female.  With medical history significant for IBS thyroid disease ADD. Recently patient had any mammogram performed that showed a 5.4 cm area of calcifications. She had an MRI of the breasts done that showed the area extended to be about 7.2 cm.. Patient had a needle core biopsy performed that showed high-grade DCIS that was ER +100% PR +86%. She was seen by Dr. Emelia Loron regarding her surgical opinion about DCIS. Patient however stated that she wanted to have bilateral mastectomies. Of note patient has a twin sister who at 52 was found to have breast cancer she too had bilateral mastectomies. Patient also has a maternal grandmother at the age of 63 who had breast cancer as well. Patient today he is without any complaints.   Past Medical History: Past Medical History  Diagnosis Date  . IBS (irritable bowel syndrome)   . ADD (attention deficit disorder)   . Lyme disease   . Contact lens/glasses fitting     wears contacts or glasses  . Breast cancer 12/04/12    left, ER/PR +  . Allergy   . Asthma   . Thyroid disease   . Hypothyroidism     Past Surgical History: Past Surgical History  Procedure Date  . Brain surgery     removal benign frontal lobe cyst in 1987  . Foot surgery 2013    bilateral  . Gynecologic cryosurgery     cervical cancer, 1998 last remained disease free with recent normal pap  . Diagnostic laparoscopy 2004    lysis adh-uterine ablation  . Breast surgery     bilateral mastopexy  .  Breast surgery     removal benign left breast lesion  . Abdominal hysterectomy 2005    TAH/BSO-lysis adh, enometriosis  . Face lift   . Axillary sentinel node biopsy 12/31/2012    Procedure: AXILLARY SENTINEL NODE BIOPSY;  Surgeon: Emelia Loron, MD;  Location: [facility];  Service: General;  Laterality: Bilateral;  bilateral sentinel node  . Total mastectomy 12/31/2012    Procedure: TOTAL MASTECTOMY;  Surgeon: Emelia Loron, MD;  Location: [facility];  Service: General;  Laterality: Bilateral;  bilateral total mastectomies  . Breast reconstruction with placement of tissue expander and flex hd (acellular hydrated dermis) 12/31/2012    Procedure: BREAST RECONSTRUCTION WITH PLACEMENT OF TISSUE EXPANDER AND FLEX HD (ACELLULAR HYDRATED DERMIS);  Surgeon: Wayland Denis, DO;  Location: [facility];  Service: Plastics;  Laterality: Bilateral;  bilateral immediate breast reconstruction with expanders and flex hd     Family History: Family History  Problem Relation Age of Onset  . Cancer Mother 19    uterine, mult myeloma  . CVA Father   . Cancer Sister 64    breast  . Cancer Maternal Grandmother 8    breast    Social History History  Substance Use Topics  . Smoking status: Never Smoker   . Smokeless tobacco: Not on file  . Alcohol Use: Yes     Comment: socially, 2-3 times  per year    Allergies: Allergies  Allergen Reactions  . Betadine (Povidone Iodine)   . Iodine     REACTION: anaphylactic  . Milk-Related Compounds   . Penicillins   . Shellfish-Derived Products   . Sulfamethoxazole W-Trimethoprim     Current Medications: Current Outpatient Prescriptions  Medication Sig Dispense Refill  . Cholecalciferol (VITAMIN D) 2000 UNITS CAPS Take 1 capsule by mouth daily.      [name] levothyroxine (SYNTHROID, LEVOTHROID) 100 MCG tablet Take 100 mcg by mouth daily.      [name] albuterol (PROVENTIL HFA;VENTOLIN HFA) 108 (90 BASE) MCG/ACT inhaler  Inhale 2 puffs into the lungs every 6 (six) hours as needed.      [name] azithromycin (ZITHROMAX) 250 MG tablet as needed. Take 2 pills by mouth on day 1, then 1 pill by mouth per day on days 2 through 5.      . dextroamphetamine (DEXTROSTAT) 5 MG tablet Take 5 mg by mouth 3 (three) times daily.      . famotidine (PEPCID) 20 MG tablet Take 20 mg by mouth as needed.      . promethazine (PHENERGAN) 25 MG tablet Take 25 mg by mouth every 6 (six) hours as needed.      . [DISCONTINUED] Eszopiclone (LUNESTA PO) Take by mouth.        OB/GYN History:menarche at age 71 she is postmenopausal she has been on hormone replacement therapy from 2005-2013 she discontinued at the time of her diagnosis of breast cancer. She is G2 with first live birth at 20.  Fertility Discussion:completed from Prior History of Cancer:patient had cervical seen in 1989 she underwent cold knife. She subsequently had in 2005 total abdominal hysterectomy  Health Maintenance:  Colonoscopy yes Bone Density yes  Last PAP smear yes   ECOG PERFORMANCE STATUS: 0 - Asymptomatic  Genetic Counseling/testing: patient will be referred to genetic counseling and testing do to her family history of breast cancers.  REVIEW OF SYSTEMS:  Patient complaints of insomnia and fatigue she relates it to holidays and stress she does have some nausea and abdominal pain secondary to IBS. She also has history of thyroid problems she is on Synthroid. She otherwise denies any fevers chills night sweats no double vision blurring of vision no hoarseness mouth sores no sinus trouble no dental problems. She denies any chest pains palpitations swelling in her feet or irregular heartbeat no shortness of breath no coughing no vomiting no urinary incontinence infections no rashes no back pain joint pain or arthritis no headaches no seizures no fainting spells no depression or 4 beers. Remainder of the 14 point review of systems is negative.  PHYSICAL  EXAMINATION: Blood pressure 144/83, pulse 93, temperature 97.6 F (36.4 C), resp. rate 20, height 5\' 5"  (1.651 m), weight 160 lb 9.6 oz (72.848 kg).  ZOX:WRUEA, healthy, no distress, well nourished and well developed SKIN: skin color, texture, turgor are normal HEAD: Normocephalic EYES: PERRLA, EOMI, Conjunctiva are pink and non-injected EARS: External ears normal OROPHARYNX:no exudate, no erythema and lips, buccal mucosa, and tongue normal  NECK: supple, no adenopathy LYMPH:  no palpable lymphadenopathy BREAST:breasts appear normal, no suspicious masses, no skin or nipple changes or axillary nodes LUNGS: clear to auscultation and percussion HEART: regular rate & rhythm ABDOMEN:abdomen soft, non-tender, normal bowel sounds and no masses or organomegaly BACK: No CVA tenderness EXTREMITIES:no edema, no clubbing, no cyanosis  NEURO: alert & oriented x 3 with fluent speech, no focal motor/sensory deficits, gait normal  STUDIES/RESULTS: Mr Breast Bilateral W Wo Contrast  12/28/2012  **ADDENDUM** CREATED: 12/28/2012 10:59:30  Pathology shows:  Breast, right, needle core biopsy, upper central  - INVASIVE DUCTAL CARCINOMA.  - DUCTAL CARCINOMA IN SITU WITH CALCIFICATIONS.  At the request of patient, I spoke with her by telephone regarding results.  She reports doing well after biopsy.  The patient is scheduled for bilateral mastectomy on Thursday.  Results were called to the office of Dr. Dwain Sarna.  Addended by:  Blair Hailey. Manson Passey, M.D. on 12/28/2012 10:59:30.  **END ADDENDUM** SIGNED BY: Blair Hailey. Manson Passey, M.D.   12/17/2012  *RADIOLOGY REPORT*  Clinical Data: Newly-diagnosed left upper outer quadrant high-grade DCIS manifesting as mammographically detected new calcifications.  BILATERAL BREAST MRI WITH AND WITHOUT CONTRAST  Technique: Multiplanar, multisequence MR images of both breasts were obtained prior to and following the intravenous administration of 15ml of Multihance.  Three dimensional  images were evaluated at the independent DynaCad workstation.  Comparison:  Prior mammograms, breast MRI 2008  Findings: No lymphadenopathy or abnormal T2-weighted hyperintensity is identified in either breast.  There is segmental clumped nodular enhancement in the left upper outer quadrant approximating the extent of mammographically evident calcifications, measuring 7.2 x 3.2 x 2.6 cm.  This is associated with adjacent post biopsy change and demonstrates predominately plateau type enhancement kinetics. Compared to previous similar exam, this is new. The posterior extent of left upper outer quadrant abnormal enhancement is approximately 5 cm posterior to clip artifact from prior biopsy.  In the central right breast 12 o'clock location, there is a 6 mm slightly irregular mass-like area of abnormal enhancement demonstrating plateau type enhancement kinetics.  No other area of abnormal enhancement is seen in either breast.  IMPRESSION: Large segmental area of abnormal left upper outer quadrant enhancement, corresponding to the area of biopsy-proven DCIS.  MRI imaging findings strongly suggest multicentric disease.  If the patient desires left lumpectomy, I would suggest MRI guided core biopsy of the posterior aspect of abnormal enhancement in the left upper outer quadrant, for pathologic determination of multicentric disease.  6 mm irregular enhancing right breast central/12 o'clock nodular area of enhancement, for which MRI guided right breast biopsy is recommended.  RECOMMENDATION: Bilateral MRI guided core biopsy  THREE-DIMENSIONAL MR IMAGE RENDERING ON INDEPENDENT WORKSTATION:  Three-dimensional MR images were rendered by post-processing of the original MR data on an independent workstation.  The three- dimensional MR images were interpreted, and findings were reported in the accompanying complete MRI report for this study.  BI-RADS CATEGORY 6:  Known biopsy-proven malignancy - appropriate action should be taken.    Original Report Authenticated By: Christiana Pellant, M.D.    Mr Biopsy/wire Localization  12/25/2012  *RADIOLOGY REPORT*  Clinical Data:  6 mm enhancing mass in the upper right breast.  The patient has recent diagnosis of left breast DCIS.  MRI GUIDED VACUUM ASSISTED BIOPSY OF THE RIGHT BREAST WITHOUT AND WITH CONTRAST  Comparison: Previous exams.  Technique: Multiplanar, multisequence MR images of the right breast were obtained prior to and following the intravenous administration of 15 ml of Mulithance.  I met with the patient, and we discussed the procedure of MRI guided biopsy, including risks, benefits, and alternatives. Specifically, we discussed the risks of infection, bleeding, tissue injury, clip migration, and inadequate sampling.  Informed, written consent was given.  Using sterile technique, 2% Lidocaine, MRI guidance, and a 9 gauge vacuum assisted device, biopsy was performed of a 6 mm enhancing mass using a lateral to medial approach.  At the conclusion of the procedure, a tissue marker clip was deployed into the biopsy cavity.  IMPRESSION: MRI guided biopsy of the right breast. No apparent complications.  THREE-DIMENSIONAL MR IMAGE RENDERING ON INDEPENDENT WORKSTATION:  Three-dimensional MR images were rendered by post-processing of the original MR data on an independent workstation.  The three- dimensional MR images were interpreted, and findings were reported in the accompanying complete MRI report for this study.   Original Report Authenticated By: Britta Mccreedy, M.D.    Nm Sentinel Node Inj-no Rpt (breast)  12/31/2012  CLINICAL DATA: right axillary sentinel node biopsy   Sulfur colloid was injected intradermally by the nuclear medicine  technologist for breast cancer sentinel node localization.     Nm Sentinel Node Inj-no Rpt (breast)  12/31/2012  CLINICAL DATA: left axillary sentinel node biopsy   Sulfur colloid was injected intradermally by the nuclear medicine  technologist for breast cancer  sentinel node localization.     Mm Digital Diagnostic Unilat R  12/25/2012  *RADIOLOGY REPORT*  Clinical Data:  MRI guided core needle biopsy of a 6 mm nodule of enhancement in the upper right breast was performed.  DIGITAL DIAGNOSTIC RIGHT MAMMOGRAM  Comparison:  Previous exams.  Findings:  Films are performed following MRI guided biopsy of 6 mm enhancing nodule in the upper central right breast.  A dumbbell shaped biopsy clip is present in the upper right breast just medial to the level of the nipple.  The clip is within a biopsy cavity that contains air and a small air-fluid level.  IMPRESSION: Satisfactory position of biopsy clip in the upper right breast.   Original Report Authenticated By: Britta Mccreedy, M.D.    Mm Radiologist Eval And Mgmt  12/07/2012  *RADIOLOGY REPORT*  ESTABLISHED PATIENT OFFICE VISIT - LEVEL II [phone])  Chief Complaint:  The patient returns for results following left breast stereotactic core biopsy.  History:  The patient was recalled from screening mammography for left breast calcifications.  Tissue sampling was recommended and stereotactic core biopsy was performed on 12/04/2012.  The patient has a family history of breast cancer in a twin sister at age 85. She also has a family history of breast cancer in a maternal grandmother.  Exam:  The biopsy site is clean and dry without hematoma formation or signs of infection.  Pathology: The pathology demonstrates high-grade DCIS which is concordant with the imaging findings.  Assessment and Plan:  The pathology was reviewed with the patient and her questions were answered.  Post biopsy wound care instructions were reviewed with the patient.  The patient is scheduled for breast MRI on 12/14/2012.  The patient was initially scheduled to see Dr. Dwain Sarna on 12/11/2012.  However, due to previous plans, the patient will reschedule this appointment.  The patient was encouraged to call the [facility] for additional questions or concerns.    Original Report Authenticated By: Rolla Plate, M.D.      LABS:    Chemistry      Component Value Date/Time   NA 139 12/24/2012 1535   K 4.1 12/24/2012 1535   CL 102 12/24/2012 1535   CO2 27 12/24/2012 1535   BUN 24.0 12/24/2012 1535   CREATININE 0.7 12/24/2012 1535      Component Value Date/Time   CALCIUM 9.4 12/24/2012 1535   ALKPHOS 38* 12/24/2012 1535   AST 23 12/24/2012 1535   ALT 21 12/24/2012 1535   BILITOT 0.38 12/24/2012 1535      Lab Results  Component  Value Date   WBC 7.8 12/24/2012   HGB 15.2 12/24/2012   HCT 45.4 12/24/2012   MCV 86.2 12/24/2012   PLT 208 12/24/2012   PATHOLOGY: ADDITIONAL INFORMATION: PROGNOSTIC INDICATORS - ACIS Results IMMUNOHISTOCHEMICAL AND MORPHOMETRIC ANALYSIS BY THE AUTOMATED CELLULAR IMAGING SYSTEM (ACIS) Estrogen Receptor (Negative, <1%): 100%, STRONG STAINING INTENSITY Progesterone Receptor (Negative, <1%): 86%, STRONG STAINING INTENSITY All controls stained appropriately Pecola Leisure MD Pathologist, Electronic Signature ( Signed 12/09/2012) FINAL DIAGNOSIS Diagnosis Breast, left, needle core biopsy, UOQ - HIGH GRADE DUCTAL CARCINOMA IN SITU WITH NECROSIS AND CALCIFICATIONS. Microscopic Comment Estrogen and progesterone receptors will be performed. Dr. Raynald Blend agrees. Called to [facility] on 12/07/12. (JDP:eps 12/07/12)  ASSESSMENT    57 year old female with  #1 new diagnosis of high-grade ductal carcinoma in situ of the left breast she is status post biopsy. Tumor is estrogen receptor +100% progesterone receptor positive she is clinical stage 0. Patient had MRI with the calcification area measured 7.2 cm. Patient desires bilateral mastectomies. Patient was noted to have an area of abnormality in the right breast she is gong to have a biopsy of this done tomorrow. Patient also desires immediate reconstruction. She has RE met with the plastic surgeon.  Clinical Trial Eligibility:No  Multidisciplinary conference discussion  yes    PLAN:    #1 patient will proceed with right breast biopsy. We discussed the rationale for that if the breast biopsy is positive then she will need a sentinel lymph node sampling but however if the right breast biopsy is negative then she will not need this and now we will proceed just with a mastectomy.  #2 we discussed systemic adjuvant therapy should she have any focus of invasive cancer. Certainly she would possibly be a candidate for antiestrogen therapy but I do think it all depends on her final pathology.  #3 I will plan on seeing her back after her surgery.     Thank you so much for allowing me to participate in the care of [facility]. I will continue to follow up the patient with you and assist in her care.  All questions were answered. The patient knows to call the clinic with any problems, questions or concerns. We can certainly see the patient much sooner if necessary.  I spent 55 minutes counseling the patient face to face. The total time spent in the appointment was 60 minutes.  Drue Second, MD Medical/Oncology [facility] [phone] (beeper) [phone] (Office)  01/04/2013, 11:09 PM

## 2013-01-04 NOTE — Progress Notes (Signed)
Mailed after appt letter to pt. 

## 2013-01-11 ENCOUNTER — Other Ambulatory Visit: Payer: Self-pay | Admitting: Emergency Medicine

## 2013-01-11 ENCOUNTER — Other Ambulatory Visit: Payer: Self-pay | Admitting: Medical Oncology

## 2013-01-11 ENCOUNTER — Ambulatory Visit (HOSPITAL_BASED_OUTPATIENT_CLINIC_OR_DEPARTMENT_OTHER): Payer: 59 | Admitting: Oncology

## 2013-01-11 ENCOUNTER — Encounter: Payer: Self-pay | Admitting: Oncology

## 2013-01-11 VITALS — BP 124/76 | HR 89 | Temp 97.6°F | Resp 20 | Ht 65.0 in | Wt 161.9 lb

## 2013-01-11 DIAGNOSIS — D0510 Intraductal carcinoma in situ of unspecified breast: Secondary | ICD-10-CM

## 2013-01-11 DIAGNOSIS — C50119 Malignant neoplasm of central portion of unspecified female breast: Secondary | ICD-10-CM

## 2013-01-11 DIAGNOSIS — Z170 Estrogen receptor positive status [ER+]: Secondary | ICD-10-CM

## 2013-01-11 DIAGNOSIS — C50419 Malignant neoplasm of upper-outer quadrant of unspecified female breast: Secondary | ICD-10-CM

## 2013-01-11 MED ORDER — DEXAMETHASONE 4 MG PO TABS: 8.0000 mg | ORAL_TABLET | Freq: Two times a day (BID) | ORAL | Status: DC

## 2013-01-11 MED ORDER — PROCHLORPERAZINE 25 MG RE SUPP: 25.0000 mg | Freq: Two times a day (BID) | RECTAL | Status: DC | PRN

## 2013-01-11 MED ORDER — ONDANSETRON HCL 8 MG PO TABS: 8.0000 mg | ORAL_TABLET | Freq: Two times a day (BID) | ORAL | Status: DC

## 2013-01-11 MED ORDER — PROCHLORPERAZINE MALEATE 10 MG PO TABS: 10.0000 mg | ORAL_TABLET | Freq: Four times a day (QID) | ORAL | Status: DC | PRN

## 2013-01-11 MED ORDER — LORAZEPAM 0.5 MG PO TABS: 0.5000 mg | ORAL_TABLET | Freq: Four times a day (QID) | ORAL | Status: DC | PRN

## 2013-01-11 NOTE — Telephone Encounter (Signed)
gv pt appt schedule for January thru June and appt for echo/Bensimhon for 2/4. Per shantel appt w/Dr. Hall Busing will follow echo.

## 2013-01-11 NOTE — Telephone Encounter (Signed)
gv pt appt schedule for January thru June. Pt given appt for echo/Dr. Bensimhon 2/4. S/w Shantell and per Shantel pt will do echo @ 9am and see Dr. Gala Romney after.

## 2013-01-12 ENCOUNTER — Ambulatory Visit (INDEPENDENT_AMBULATORY_CARE_PROVIDER_SITE_OTHER): Payer: Commercial Managed Care - PPO | Admitting: General Surgery

## 2013-01-12 ENCOUNTER — Encounter (INDEPENDENT_AMBULATORY_CARE_PROVIDER_SITE_OTHER): Payer: Self-pay | Admitting: General Surgery

## 2013-01-12 VITALS — BP 124/70 | HR 72 | Resp 16 | Ht 65.0 in | Wt 162.0 lb

## 2013-01-12 DIAGNOSIS — Z09 Encounter for follow-up examination after completed treatment for conditions other than malignant neoplasm: Secondary | ICD-10-CM

## 2013-01-12 NOTE — Progress Notes (Signed)
Subjective:     Patient ID: Jane Gutierrez, female   DOB: February 20, 1956, 57 y.o.   MRN: [id number]  HPI 52 yof with significant fh who underwent bilateral mastectomy for bilateral breast cancer.  Path on the left ends up being a stage II node negative her 2 positive ((6.81),er/pr positive (99/10) tumor.  The right was stage I idc.  She underwent bilateral expander based reconstruction at the same time.  She has done really well.  Only taking some advil right now.   Review of Systems     Objective:   Physical Exam Healing incisions (vertical due to prior surgery) with healthy skin now, expanders in place with expected drain output    Assessment:     S/p bilateral mastectomy, snbx    Plan:     She is doing very well.  This is her2 positive and she has seen med onc with recommendation for adjuvant chemo/herceptin.  She is going to get second opinion prior to beginning.  She shouldn't need xrt and is undergoing expansion with Dr. Kelly Splinter. She and I discussed port placement with risks/benefits and will tentatively plan.  Will also refer to pt/lymphedema prevention once cleared with plastic surgery.

## 2013-01-14 ENCOUNTER — Encounter: Payer: Self-pay | Admitting: *Deleted

## 2013-01-14 ENCOUNTER — Telehealth: Payer: Self-pay | Admitting: Oncology

## 2013-01-14 ENCOUNTER — Other Ambulatory Visit: Payer: 59

## 2013-01-14 NOTE — Telephone Encounter (Signed)
Pt stopped by re lb appts for her may/june tx appts. Lbs added and pt given new schedule for February thru June.

## 2013-01-15 ENCOUNTER — Telehealth: Payer: Self-pay | Admitting: Medical Oncology

## 2013-01-15 NOTE — Telephone Encounter (Signed)
VMOM from Deb, nurse case manager with [facility] requesting chemo treatment regimend to be faxed at [phone]. Mssg give to Thelma Barge, authorization specialist.

## 2013-01-17 NOTE — Progress Notes (Signed)
OFFICE PROGRESS NOTE  CC  [name], MD [address] [place] [address] Dr. Emelia Loron Dr. Chipper Herb Dr. Starleen Arms Dr. 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  PRIOR THERAPY:  #1 patient has multiple medical problems but recently she had a mammogram performed that showed a 5.4 cm area of calcifications. MRI of the breasts showed the area extended to about 7.2 cm. Needle core biopsy performed showed a high-grade DCIS that was ER positive PR positive.  #2 she was seen by Dr. Emelia Loron regarding surgical options. Patient wanted to have bilateral mastectomies. She went on to have this performed on 12/31/2012. The final pathology did reveal a 2.3 cm invasive ductal carcinoma in the left breast. The tumor was ER +100% PR +86% HER-2/neu was amplified with a ratio of 6.81. Ki-67 was elevated at 46%. She has had immediate reconstruction.  #3 patient is seen back in medical oncology for discussion of adjuvant treatment. Since patient is HER-2/neu positive recommendation is her 2-based therapy consisting of Taxotere carboplatinum and Herceptin. The Taxotere or and carboplatinum will be given every 3 weeks for 6 cycles. She will receive Neulasta on day 2. Herceptin will be given weekly with duration of chemotherapy and then every 3 weeks to finish out a one-year of treatment. Patient also understands that she will be placed on antiestrogen therapy with an aromatase inhibitor or tamoxifen.  CURRENT THERAPY:patient will require adjuvant TCH to begin in February 2014  INTERVAL HISTORY: Jane Gutierrez 58 y.o. female returns for followup visit post bilateral mastectomies. She and I discussed her final pathology. She did have invasive disease that is HER-2 positive.patient postoperatively is doing well she seems to be  in good spirits and she is without any complaints. She is accompanied by her sister today as well  MEDICAL HISTORY: Past Medical History  Diagnosis Date  . IBS (irritable bowel syndrome)   . ADD (attention deficit disorder)   . Lyme disease   . Contact lens/glasses fitting     wears contacts or glasses  . Breast cancer 12/04/12    left, ER/PR +  . Allergy   . Asthma   . Thyroid disease   . Hypothyroidism     ALLERGIES:  is allergic to betadine; iodine; milk-related compounds; penicillins; shellfish-derived products; and sulfamethoxazole w-trimethoprim.  MEDICATIONS:  Current Outpatient Prescriptions  Medication Sig Dispense Refill  . albuterol (PROVENTIL HFA;VENTOLIN HFA) 108 (90 BASE) MCG/ACT inhaler Inhale 2 puffs into the lungs every 6 (six) hours as needed.      . Cholecalciferol (VITAMIN D) 2000 UNITS CAPS Take 1 capsule by mouth daily.      [name] dextroamphetamine (DEXTROSTAT) 5 MG tablet Take 5 mg by mouth 3 (three) times daily.      . famotidine (PEPCID) 20 MG tablet Take 20 mg by mouth as needed.      [name] ibuprofen (ADVIL,MOTRIN) 100 MG tablet Take 200 mg by mouth every 6 (six) hours as needed.      [name] levothyroxine (SYNTHROID, LEVOTHROID) 100 MCG tablet Take 100 mcg by mouth daily.      . Multiple Vitamin (MULTIVITAMIN) tablet Take 1 tablet by mouth daily.      . promethazine (PHENERGAN) 25 MG tablet Take 25 mg by mouth every 6 (six) hours as needed.      . vitamin C (ASCORBIC  ACID) 500 MG tablet Take 500 mg by mouth daily.      [name] dexamethasone (DECADRON) 4 MG tablet Take 2 tablets (8 mg total) by mouth 2 (two) times daily with a meal. Take two times a day the day before Taxotere. Then take two times a day starting the day after chemo for 3 days.  30 tablet  1  . LORazepam (ATIVAN) 0.5 MG tablet Take 1 tablet (0.5 mg total) by mouth every 6 (six) hours as needed (Nausea or vomiting).  30 tablet  0  . ondansetron (ZOFRAN) 8 MG tablet Take 1 tablet (8 mg total) by mouth 2 (two)  times daily. Take two times a day starting the day after chemo for 3 days. Then take two times a day as needed for nausea or vomiting.  30 tablet  1  . prochlorperazine (COMPAZINE) 10 MG tablet Take 1 tablet (10 mg total) by mouth every 6 (six) hours as needed (Nausea or vomiting).  30 tablet  1  . prochlorperazine (COMPAZINE) 25 MG suppository Place 1 suppository (25 mg total) rectally every 12 (twelve) hours as needed for nausea.  12 suppository  3  . [DISCONTINUED] Eszopiclone (LUNESTA PO) Take by mouth.        SURGICAL HISTORY:  Past Surgical History  Procedure Date  . Brain surgery     removal benign frontal lobe cyst in 1987  . Foot surgery 2013    bilateral  . Gynecologic cryosurgery     cervical cancer, 1998 last remained disease free with recent normal pap  . Diagnostic laparoscopy 2004    lysis adh-uterine ablation  . Breast surgery     bilateral mastopexy  . Breast surgery     removal benign left breast lesion  . Abdominal hysterectomy 2005    TAH/BSO-lysis adh, enometriosis  . Face lift   . Axillary sentinel node biopsy 12/31/2012    Procedure: AXILLARY SENTINEL NODE BIOPSY;  Surgeon: Emelia Loron, MD;  Location: [facility];  Service: General;  Laterality: Bilateral;  bilateral sentinel node  . Total mastectomy 12/31/2012    Procedure: TOTAL MASTECTOMY;  Surgeon: Emelia Loron, MD;  Location: [facility];  Service: General;  Laterality: Bilateral;  bilateral total mastectomies  . Breast reconstruction with placement of tissue expander and flex hd (acellular hydrated dermis) 12/31/2012    Procedure: BREAST RECONSTRUCTION WITH PLACEMENT OF TISSUE EXPANDER AND FLEX HD (ACELLULAR HYDRATED DERMIS);  Surgeon: Wayland Denis, DO;  Location: [facility];  Service: Plastics;  Laterality: Bilateral;  bilateral immediate breast reconstruction with expanders and flex hd     REVIEW OF SYSTEMS:  A comprehensive review of systems was negative.    HEALTH MAINTENANCE:   PHYSICAL EXAMINATION: Blood pressure 124/76, pulse 89, temperature 97.6 F (36.4 C), temperature source Oral, resp. rate 20, height 5\' 5"  (1.651 m), weight 161 lb 14.4 oz (73.437 kg). Body mass index is 26.94 kg/(m^2). ECOG PERFORMANCE STATUS: 1 - Symptomatic but completely ambulatory   General appearance: alert, cooperative and appears stated age Lymph nodes: Cervical, supraclavicular, and axillary nodes normal. Back: symmetric, no curvature. ROM normal. No CVA tenderness. Cardio: regular rate and rhythm GI: soft, non-tender; bowel sounds normal; no masses,  no organomegaly Extremities: extremities normal, atraumatic, no cyanosis or edema Neurologic: Grossly normal   LABORATORY DATA: Lab Results  Component Value Date   WBC 7.8 12/24/2012   HGB 15.2 12/24/2012   HCT 45.4 12/24/2012   MCV 86.2 12/24/2012   PLT 208  12/24/2012      Chemistry      Component Value Date/Time   NA 139 12/24/2012 1535   K 4.1 12/24/2012 1535   CL 102 12/24/2012 1535   CO2 27 12/24/2012 1535   BUN 24.0 12/24/2012 1535   CREATININE 0.7 12/24/2012 1535      Component Value Date/Time   CALCIUM 9.4 12/24/2012 1535   ALKPHOS 38* 12/24/2012 1535   AST 23 12/24/2012 1535   ALT 21 12/24/2012 1535   BILITOT 0.38 12/24/2012 1535     12/31/2012 ADDITIONAL INFORMATION: 1. CHROMOGENIC IN-SITU HYBRIDIZATION Interpretation: HER2/NEU BY CISH - SHOWS AMPLIFICATION BY CISH ANALYSIS. THE RATIO OF HER2: CEP 17 SIGNALS WAS 6.81 Reference range: Ratio: HER2:CEP17 < 1.8 gene amplification not observed Ratio: HER2:CEP 17 1.8-2.2 - equivocal result Ratio: HER2:CEP17 > 2.2 - gene amplification observed Pecola Leisure MD Pathologist, Electronic Signature ( Signed 01/11/2013) 1. PROGNOSTIC INDICATORS - ACIS Results IMMUNOHISTOCHEMICAL AND MORPHOMETRIC ANALYSIS BY THE AUTOMATED CELLULAR IMAGING SYSTEM (ACIS) Estrogen Receptor (Negative, <1%): 99%, STRONG STAINING INTENSITY Progesterone Receptor (Negative, <1%): 10%,  STRONG STAINING INTENSITY Proliferation Marker Ki67 by M IB-1 (Low<20%): 46% All controls stained appropriately Pecola Leisure MD Pathologist, Electronic Signature ( Signed 01/06/2013) 1 of 4 FINAL for HULL, Mazell G [phone]) FINAL DIAGNOSIS Diagnosis 1. Breast, simple mastectomy, Left - INVASIVE DUCTAL CARCINOMA, GRADE II (2.3 CM), SEE COMMENT. - LYMPHOVASCULAR INVASION IDENTIFIED - INVASIVE TUMOR IS 3 CM FROM THE NEAREST MARGIN (DEEP). - HIGH GRADE DUCTAL CARCINOMA IN SITU WITH COMEDO NECROSIS AND CALCIFICATIONS. - SEE TUMOR SYNOPTIC TEMPLATE BELOW. 2. Lymph node, sentinel, biopsy, Left axillary - ONE LYMPH NODE, NEGATIVE FOR TUMOR (0/1). 3. Breast, simple mastectomy, Right - BENIGN BREAST TISSUE, SEE COMMENT. - NEGATIVE FOR ATYPIA OR MALIGNANCY. 4. Lymph node, sentinel, biopsy, Right axillary - ONE LYMPH NODE, NEGATIVE FOR TUMOR (0/1). Microscopic Comment 1. BREAST, INVASIVE TUMOR, WITH LYMPH NODE SAMPLING Specimen, including laterality: Left breast Procedure: Simple mastectomy Grade: II of III Tubule formation: 3 Nuclear pleomorphism: 2 Mitotic: 1 Tumor size (gross measurement) 2.3 cm Margins: Invasive, distance to closest margin: 3.0 cm In-situ, distance to closest margin: 3.0 cm (deep) If margin positive, focally or broadly: N/A Lymphovascular invasion: Present, extensive Ductal carcinoma in situ: Present Grade: III of III Extensive intraductal component: Absent Lobular neoplasia: Absent Tumor focality: Unifocal Treatment effect: None If present, treatment effect in breast tissue, lymph nodes or both: N/A Extent of tumor: Skin: Grossly negative for tumor Nipple: Grossly negative for tumor Skeletal muscle: Microscopically negative for tumor Lymph nodes: # examined: 1 Lymph nodes with metastasis: 0 Breast prognostic profile: Estrogen receptor: Repeated, previous study shows 100% positivity. [phone]) Progesterone receptor: Repeated, previous study  demonstrated 86% positivity ([id number]) Her 2 neu: Pending and will be reported in an addendum. Ki-67: Pending and will be reported in an addendum. Non-neoplastic breast: No significant findings. TNM: pT2, pN0, pMX Comments: None. (CR:kh 01-04-13) 3. The entire 2.0 cm previous biopsy hematoma is submitted for histopathologic review. On review, there is 2 of 4 FINAL for HULL, Primrose G [phone]) Microscopic Comment(continued) no in situ or invasive carcinoma identified in any of the slide sections examined. Additional non-neoplastic findings include benign fibrocystic change, pseudoangiomatous stromal hyperplasia, fibroadenomatoid nodules, periductal chronic inflammation with associated stromal fibrosis and microcalcifications in benign ducts and lobules. Pathologic Stage is pTX pN0, PMX. The surgical resection margin(s) of the specimen were inked and microscopically evaluated. [place] [facility] RADIOGRAPHIC STUDIES:  Mr Biopsy/wire Localization  12/25/2012  *RADIOLOGY REPORT*  Clinical Data:  6  mm enhancing mass in the upper right breast.  The patient has recent diagnosis of left breast DCIS.  MRI GUIDED VACUUM ASSISTED BIOPSY OF THE RIGHT BREAST WITHOUT AND WITH CONTRAST  Comparison: Previous exams.  Technique: Multiplanar, multisequence MR images of the right breast were obtained prior to and following the intravenous administration of 15 ml of Mulithance.  I met with the patient, and we discussed the procedure of MRI guided biopsy, including risks, benefits, and alternatives. Specifically, we discussed the risks of infection, bleeding, tissue injury, clip migration, and inadequate sampling.  Informed, written consent was given.  Using sterile technique, 2% Lidocaine, MRI guidance, and a 9 gauge vacuum assisted device, biopsy was performed of a 6 mm enhancing mass using a lateral to medial approach.  At the conclusion of the procedure, a tissue marker clip was deployed into the biopsy cavity.  IMPRESSION:  MRI guided biopsy of the right breast. No apparent complications.  THREE-DIMENSIONAL MR IMAGE RENDERING ON INDEPENDENT WORKSTATION:  Three-dimensional MR images were rendered by post-processing of the original MR data on an independent workstation.  The three- dimensional MR images were interpreted, and findings were reported in the accompanying complete MRI report for this study.   Original Report Authenticated By: Britta Mccreedy, M.D.    Nm Sentinel Node Inj-no Rpt (breast)  12/31/2012  CLINICAL DATA: right axillary sentinel node biopsy   Sulfur colloid was injected intradermally by the nuclear medicine  technologist for breast cancer sentinel node localization.     Nm Sentinel Node Inj-no Rpt (breast)  12/31/2012  CLINICAL DATA: left axillary sentinel node biopsy   Sulfur colloid was injected intradermally by the nuclear medicine  technologist for breast cancer sentinel node localization.     Mm Digital Diagnostic Unilat R  12/25/2012  *RADIOLOGY REPORT*  Clinical Data:  MRI guided core needle biopsy of a 6 mm nodule of enhancement in the upper right breast was performed.  DIGITAL DIAGNOSTIC RIGHT MAMMOGRAM  Comparison:  Previous exams.  Findings:  Films are performed following MRI guided biopsy of 6 mm enhancing nodule in the upper central right breast.  A dumbbell shaped biopsy clip is present in the upper right breast just medial to the level of the nipple.  The clip is within a biopsy cavity that contains air and a small air-fluid level.  IMPRESSION: Satisfactory position of biopsy clip in the upper right breast.   Original Report Authenticated By: Britta Mccreedy, M.D.     ASSESSMENT: 57 year old female with  #1 new diagnosis of invasive ductal carcinoma with ductal carcinoma in situ. The tumor measures 2.3 cm from the left breast tumor is ER +100% PR +86% HER-2/neu amplified with a ratio 6.81 Ki-67 elevated at 46%. Sentinel nodes were negative for metastatic disease.  #2 patient will be treated with  adjuvant chemotherapy and Herceptin. I have recommended Taxotere carboplatinum to be given every 3 weeks for a total of 6 cycles. She will also receive Herceptin weekly for the duration of chemotherapy and then every 3 weeks to finish out one year. When she finishes up the chemotherapy we will then start her on adjuvant antiestrogen therapy. She understands the risks and benefits of treatment and the rationale.  #3 patient will need echocardiogram and I will get her referral to cardiology in the CHF clinic. She will also need a Port-A-Cath and we'll get this placed. Chemotherapy class will also be set up for her.   PLAN:   #1 Port-A-Cath placement as soon as possible.  #2  chemotherapy class and echocardiogram.  #3 she will return in 2/13 for cycle one of her chemotherapy    All questions were answered. The patient knows to call the clinic with any problems, questions or concerns. We can certainly see the patient much sooner if necessary.  I spent 30 minutes counseling the patient face to face. The total time spent in the appointment was 40 minutes.    Drue Second, MD Medical/Oncology [facility] [phone] (beeper) [phone] (Office)

## 2013-01-18 ENCOUNTER — Encounter: Payer: Self-pay | Admitting: Oncology

## 2013-01-18 NOTE — Progress Notes (Signed)
Called and spoke with [name] to advise her of co-pay assistance for Herceptin and Neulasta. She said it was ok for [name] to bring her the forms and she will sign and send back with her. I will submit once received and get enrolled.

## 2013-01-19 ENCOUNTER — Encounter: Payer: Self-pay | Admitting: Oncology

## 2013-01-19 NOTE — Progress Notes (Signed)
Patient signed forms for Neulasta and Herceptin. I enrolled her in 1st step for neulasta and faxed [employer] for the Herceptin.

## 2013-01-20 ENCOUNTER — Encounter (HOSPITAL_BASED_OUTPATIENT_CLINIC_OR_DEPARTMENT_OTHER): Payer: Self-pay | Admitting: *Deleted

## 2013-01-20 ENCOUNTER — Encounter: Payer: Self-pay | Admitting: Oncology

## 2013-01-20 NOTE — Progress Notes (Addendum)
Faxed 48 pages to her Case Manager Reece Levy, RN. [phone] Fax [phone] ext [phone] Phone  01/21/13 Call from Deb Case Manager and it is approved from 02/04/13 to 07/21/13. Auth number [id number].   03/05/13 Update to [facility] Case Manager 41 pages.  04/28/13 Update faxed to [facility] Case Manager 73 pages.

## 2013-01-20 NOTE — Progress Notes (Signed)
Pt here for bilat mast /bilat snbx-tissue expanders-did well No labs needed

## 2013-01-24 ENCOUNTER — Encounter: Payer: Self-pay | Admitting: Oncology

## 2013-01-25 ENCOUNTER — Other Ambulatory Visit: Payer: 59 | Admitting: Lab

## 2013-01-25 ENCOUNTER — Encounter: Payer: 59 | Admitting: Genetic Counselor

## 2013-01-26 ENCOUNTER — Other Ambulatory Visit: Payer: Self-pay | Admitting: *Deleted

## 2013-01-26 ENCOUNTER — Ambulatory Visit (HOSPITAL_COMMUNITY)
Admission: RE | Admit: 2013-01-26 | Discharge: 2013-01-26 | Disposition: A | Discharge status: Home / Self Care | Payer: 59 | Source: Ambulatory Visit | Attending: Internal Medicine | Admitting: Internal Medicine

## 2013-01-26 ENCOUNTER — Ambulatory Visit (HOSPITAL_BASED_OUTPATIENT_CLINIC_OR_DEPARTMENT_OTHER)
Admission: RE | Admit: 2013-01-26 | Discharge: 2013-01-26 | Disposition: A | Discharge status: Home / Self Care | Payer: 59 | Source: Ambulatory Visit | Attending: Internal Medicine | Admitting: Internal Medicine

## 2013-01-26 VITALS — BP 128/90 | HR 89 | Wt 158.0 lb

## 2013-01-26 DIAGNOSIS — D0590 Unspecified type of carcinoma in situ of unspecified breast: Secondary | ICD-10-CM

## 2013-01-26 DIAGNOSIS — Z01818 Encounter for other preprocedural examination: Secondary | ICD-10-CM | POA: Insufficient documentation

## 2013-01-26 DIAGNOSIS — C50419 Malignant neoplasm of upper-outer quadrant of unspecified female breast: Secondary | ICD-10-CM

## 2013-01-26 DIAGNOSIS — D0510 Intraductal carcinoma in situ of unspecified breast: Secondary | ICD-10-CM

## 2013-01-26 DIAGNOSIS — Z09 Encounter for follow-up examination after completed treatment for conditions other than malignant neoplasm: Secondary | ICD-10-CM

## 2013-01-26 DIAGNOSIS — E039 Hypothyroidism, unspecified: Secondary | ICD-10-CM | POA: Insufficient documentation

## 2013-01-26 DIAGNOSIS — C50919 Malignant neoplasm of unspecified site of unspecified female breast: Secondary | ICD-10-CM | POA: Insufficient documentation

## 2013-01-26 MED ORDER — LIDOCAINE-PRILOCAINE 2.5-2.5 % EX CREA: TOPICAL_CREAM | CUTANEOUS | Status: DC

## 2013-01-26 NOTE — Progress Notes (Signed)
Echocardiogram 2D Echocardiogram has been performed.  Jane Gutierrez 01/26/2013, 11:44 AM

## 2013-01-26 NOTE — Progress Notes (Signed)
Patient ID: Jane Gutierrez, female   DOB: [birth year], 57 y.o.   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 ER +100% PR +86% HER-2/neu (Sentinel nodes were negative for metastatic disease).  She is s/p bilateral mastectomies with reconstruction.    She presents as new patient at the request of Dr Welton Flakes to enroll in cardio-oncology clinic. Denies any known h/o heart disease. Denies SOB/PND/Orthopnea. Exercises 40 minutes per day. Work full time in [employer].   Plan for adjuvant chemotherapy and Herceptin. She will receive Taxotere carboplatinum to be given every 3 weeks for a total of 6 cycles. She will also receive Herceptin weekly for the duration of chemotherapy and then every 3 weeks to finish out one year. She will start chemo next week.   01/26/13 ECHO EF 60% lateral S' 10.9 Grade II diastolic dysfunction   Review of Systems:     Cardiac Review of Systems: {Y] = yes [ ]  = no  Chest Pain [    ]  Resting SOB [   ] Exertional SOB  [  ]  Orthopnea [  ]   Pedal Edema [   ]    Palpitations [  ] Syncope  [  ]   Presyncope [   ]  General Review of Systems: [Y] = yes [  ]=no Constitional: recent weight change [  ]; anorexia [  ]; fatigue [  ]; nausea [  ]; night sweats [  ]; fever [  ]; or chills [  ];                                                                                                                                          Dental: poor dentition[  ]; Last Dentist visit:   Eye : blurred vision [  ]; diplopia [   ]; vision changes [  ];  Amaurosis fugax[  ]; Resp: cough [  ];  wheezing[  ];  hemoptysis[  ]; shortness of breath[  ]; paroxysmal nocturnal dyspnea[  ]; dyspnea on exertion[  ]; or orthopnea[  ];  GI:  gallstones[  ], vomiting[  ];  dysphagia[  ]; melena[  ];  hematochezia [  ]; heartburn[  ];   Hx of  Colonoscopy[  ]; GU:  kidney stones [  ]; hematuria[  ];   dysuria [  ];  nocturia[  ];  history of     obstruction [  ];                 Skin: rash, swelling[  ];, hair loss[  ];  peripheral edema[  ];  or itching[  ]; Musculosketetal: myalgias[  ];  joint swelling[  ];  joint erythema[  ];  joint pain[  ];  back pain[  ];  Heme/Lymph: bruising[  ];  bleeding[  ];  anemia[  ];  Neuro: TIA[  ];  headaches[  ];  stroke[  ];  vertigo[  ];  seizures[  ];   paresthesias[  ];  difficulty walking[  ];  Psych:depression[  ]; anxiety[  ];  Endocrine: diabetes[  ];  thyroid dysfunction[  ];  Immunizations: Flu [  ]; Pneumococcal[  ];  Other:    Past Medical History  Diagnosis Date  . IBS (irritable bowel syndrome)   . ADD (attention deficit disorder)   . Lyme disease   . Contact lens/glasses fitting     wears contacts or glasses  . Breast cancer 12/04/12    left, ER/PR +  . Allergy   . Asthma   . Thyroid disease   . Hypothyroidism     Current Outpatient Prescriptions  Medication Sig Dispense Refill  . albuterol (PROVENTIL HFA;VENTOLIN HFA) 108 (90 BASE) MCG/ACT inhaler Inhale 2 puffs into the lungs every 6 (six) hours as needed.      . Cholecalciferol (VITAMIN D) 2000 UNITS CAPS Take 1 capsule by mouth daily.      [name] dexamethasone (DECADRON) 4 MG tablet Take 2 tablets (8 mg total) by mouth 2 (two) times daily with a meal. Take two times a day the day before Taxotere. Then take two times a day starting the day after chemo for 3 days.  30 tablet  1  . dextroamphetamine (DEXTROSTAT) 5 MG tablet Take 5 mg by mouth 3 (three) times daily.      . famotidine (PEPCID) 20 MG tablet Take 20 mg by mouth as needed.      [name] ibuprofen (ADVIL,MOTRIN) 100 MG tablet Take 200 mg by mouth every 6 (six) hours as needed.      [name] levothyroxine (SYNTHROID, LEVOTHROID) 100 MCG tablet Take 100 mcg by mouth daily.      [name] LORazepam (ATIVAN) 0.5 MG tablet Take 1 tablet (0.5 mg total) by mouth every 6 (six) hours as needed (Nausea or  vomiting).  30 tablet  0  . Multiple Vitamin (MULTIVITAMIN) tablet Take 1 tablet by mouth daily.      . ondansetron (ZOFRAN) 8 MG tablet Take 1 tablet (8 mg total) by mouth 2 (two) times daily. Take two times a day starting the day after chemo for 3 days. Then take two times a day as needed for nausea or vomiting.  30 tablet  1  . prochlorperazine (COMPAZINE) 10 MG tablet Take 1 tablet (10 mg total) by mouth every 6 (six) hours as needed (Nausea or vomiting).  30 tablet  1  . prochlorperazine (COMPAZINE) 25 MG suppository Place 1 suppository (25 mg total) rectally every 12 (twelve) hours as needed for nausea.  12 suppository  3  . promethazine (PHENERGAN) 25 MG tablet Take 25 mg by mouth every 6 (six) hours as needed.      . vitamin C (ASCORBIC ACID) 500 MG tablet Take 500 mg by mouth daily.      . [DISCONTINUED] Eszopiclone (LUNESTA PO) Take by mouth.         Allergies  Allergen Reactions  . Betadine (Povidone Iodine)   . Iodine     REACTION: anaphylactic  . Milk-Related Compounds   . Penicillins   . Shellfish-Derived Products   . Sulfamethoxazole W-Trimethoprim     History   Social History  . Marital Status: Divorced    Spouse Name: N/A  Number of Children: N/A  . Years of Education: N/A   Occupational History  . Not on file.   Social History Main Topics  . Smoking status: Never Smoker   . Smokeless tobacco: Not on file  . Alcohol Use: Yes     Comment: socially, 2-3 times per year  . Drug Use: No  . Sexually Active: Yes     Comment: menarche age 57, p 2, G 1, HRT x 8 yrs   Other Topics Concern  . Not on file   Social History Narrative  . No narrative on file    Family History  Problem Relation Age of Onset  . Cancer Mother 30    uterine, mult myeloma  . CVA Father   . Cancer Sister 50    breast  . Cancer Maternal Grandmother 31    breast    PHYSICAL EXAM: Filed Vitals:   01/26/13 1002  BP: 128/90  Pulse: 89   General:  Well appearing. No  respiratory difficulty HEENT: normal Neck: supple. no JVD. Carotids 2+ bilat; no bruits. No lymphadenopathy or thryomegaly appreciated. Cor: PMI nondisplaced. Regular rate & rhythm. No rubs, gallops or murmurs. Lungs: clear Abdomen: soft, nontender, nondistended. No hepatosplenomegaly. No bruits or masses. Good bowel sounds. Extremities: no cyanosis, clubbing, rash, edema Neuro: alert & oriented x 3, cranial nerves grossly intact. moves all 4 extremities w/o difficulty. Affect pleasant.   ASSESSMENT & PLAN:

## 2013-01-26 NOTE — Assessment & Plan Note (Addendum)
Explained the purposed of the HF clinic as it relates to breast cancer. Explained that there is ~ 10% risk of cardiotoxicity with Herceptin. Dr Gala Romney reviewed and discussed ECHO results.  Follow up in 2 months with an ECHO.   Patient seen and examined with Tonye Becket, NP. We discussed all aspects of the encounter. I agree with the assessment and plan as stated above.  Role of cardio-oncology clinic explained in detail. Echo reviewed personally. EF and Doppler parameters look good. Explained HF symptoms to watch for. OK to start Herceptin. Will need echos every 2 months while getting weekly Herceptin.

## 2013-01-26 NOTE — Patient Instructions (Addendum)
Follow up in 2 months with an ECHO  

## 2013-01-27 ENCOUNTER — Ambulatory Visit (HOSPITAL_BASED_OUTPATIENT_CLINIC_OR_DEPARTMENT_OTHER)
Admission: RE | Admit: 2013-01-27 | Discharge: 2013-01-27 | Disposition: A | Discharge status: Home / Self Care | Payer: 59 | Source: Ambulatory Visit | Attending: General Surgery | Admitting: General Surgery

## 2013-01-27 ENCOUNTER — Ambulatory Visit (HOSPITAL_COMMUNITY): Payer: 59

## 2013-01-27 ENCOUNTER — Encounter (HOSPITAL_BASED_OUTPATIENT_CLINIC_OR_DEPARTMENT_OTHER): Payer: Self-pay | Admitting: Anesthesiology

## 2013-01-27 ENCOUNTER — Encounter (HOSPITAL_BASED_OUTPATIENT_CLINIC_OR_DEPARTMENT_OTHER): Payer: Self-pay | Admitting: *Deleted

## 2013-01-27 ENCOUNTER — Encounter (HOSPITAL_BASED_OUTPATIENT_CLINIC_OR_DEPARTMENT_OTHER)
Admission: RE | Disposition: A | Discharge status: Home / Self Care | Payer: Self-pay | Source: Ambulatory Visit | Attending: General Surgery

## 2013-01-27 ENCOUNTER — Ambulatory Visit (HOSPITAL_BASED_OUTPATIENT_CLINIC_OR_DEPARTMENT_OTHER): Payer: 59 | Admitting: Anesthesiology

## 2013-01-27 DIAGNOSIS — C50919 Malignant neoplasm of unspecified site of unspecified female breast: Secondary | ICD-10-CM | POA: Insufficient documentation

## 2013-01-27 DIAGNOSIS — Z9010 Acquired absence of unspecified breast and nipple: Secondary | ICD-10-CM | POA: Insufficient documentation

## 2013-01-27 HISTORY — PX: PORTACATH PLACEMENT: SHX2246

## 2013-01-27 LAB — POCT HEMOGLOBIN-HEMACUE: Hemoglobin: 14.1 g/dL (ref 12.0–15.0)

## 2013-01-27 SURGERY — INSERTION, TUNNELED CENTRAL VENOUS DEVICE, WITH PORT
Anesthesia: General | Site: Chest | Laterality: Right | Wound class: Clean

## 2013-01-27 MED ORDER — VANCOMYCIN HCL IN DEXTROSE 1-5 GM/200ML-% IV SOLN
1000.0000 mg | INTRAVENOUS | Status: AC
  Administered 2013-01-27: 1000 mg via INTRAVENOUS

## 2013-01-27 MED ORDER — FENTANYL CITRATE 0.05 MG/ML IJ SOLN: 50.0000 ug | INTRAMUSCULAR | Status: DC | PRN

## 2013-01-27 MED ORDER — HYDROMORPHONE HCL PF 1 MG/ML IJ SOLN
0.2500 mg | INTRAMUSCULAR | Status: DC | PRN
  Administered 2013-01-27: 0.25 mg via INTRAVENOUS

## 2013-01-27 MED ORDER — OXYCODONE HCL 5 MG PO TABS
5.0000 mg | ORAL_TABLET | ORAL | Status: DC | PRN
  Administered 2013-01-27: 5 mg via ORAL

## 2013-01-27 MED ORDER — HEPARIN SOD (PORK) LOCK FLUSH 100 UNIT/ML IV SOLN
INTRAVENOUS | Status: DC | PRN
  Administered 2013-01-27: 300 [IU] via INTRAVENOUS

## 2013-01-27 MED ORDER — OXYCODONE-ACETAMINOPHEN 5-325 MG PO TABS: 1.0000 | ORAL_TABLET | ORAL | Status: DC | PRN

## 2013-01-27 MED ORDER — MIDAZOLAM HCL 5 MG/5ML IJ SOLN
INTRAMUSCULAR | Status: DC | PRN
  Administered 2013-01-27: 2 mg via INTRAVENOUS

## 2013-01-27 MED ORDER — ONDANSETRON HCL 4 MG/2ML IJ SOLN
INTRAMUSCULAR | Status: DC | PRN
  Administered 2013-01-27: 4 mg via INTRAVENOUS

## 2013-01-27 MED ORDER — BUPIVACAINE HCL (PF) 0.25 % IJ SOLN
INTRAMUSCULAR | Status: DC | PRN
  Administered 2013-01-27: 10 mL

## 2013-01-27 MED ORDER — MIDAZOLAM HCL 2 MG/2ML IJ SOLN: 1.0000 mg | INTRAMUSCULAR | Status: DC | PRN

## 2013-01-27 MED ORDER — DEXAMETHASONE SODIUM PHOSPHATE 4 MG/ML IJ SOLN
INTRAMUSCULAR | Status: DC | PRN
  Administered 2013-01-27: 10 mg via INTRAVENOUS

## 2013-01-27 MED ORDER — FENTANYL CITRATE 0.05 MG/ML IJ SOLN
INTRAMUSCULAR | Status: DC | PRN
  Administered 2013-01-27: 100 ug via INTRAVENOUS

## 2013-01-27 MED ORDER — LIDOCAINE HCL (CARDIAC) 20 MG/ML IV SOLN
INTRAVENOUS | Status: DC | PRN
  Administered 2013-01-27: 75 mg via INTRAVENOUS

## 2013-01-27 MED ORDER — HEPARIN (PORCINE) IN NACL 2-0.9 UNIT/ML-% IJ SOLN
INTRAMUSCULAR | Status: DC | PRN
  Administered 2013-01-27: 1 via INTRAVENOUS

## 2013-01-27 MED ORDER — LACTATED RINGERS IV SOLN
INTRAVENOUS | Status: DC
  Administered 2013-01-27: 09:00:00 via INTRAVENOUS

## 2013-01-27 SURGICAL SUPPLY — 50 items
BAG DECANTER FOR FLEXI CONT (MISCELLANEOUS) ×2 IMPLANT
BENZOIN TINCTURE PRP APPL 2/3 (GAUZE/BANDAGES/DRESSINGS) ×2 IMPLANT
BLADE SURG 11 STRL SS (BLADE) ×2 IMPLANT
BLADE SURG 15 STRL LF DISP TIS (BLADE) ×1 IMPLANT
BLADE SURG 15 STRL SS (BLADE) ×1
CANISTER SUCTION 1200CC (MISCELLANEOUS) IMPLANT
CHLORAPREP W/TINT 26ML (MISCELLANEOUS) ×2 IMPLANT
CLOTH BEACON ORANGE TIMEOUT ST (SAFETY) ×2 IMPLANT
COVER MAYO STAND STRL (DRAPES) ×2 IMPLANT
COVER TABLE BACK 60X90 (DRAPES) ×2 IMPLANT
DECANTER SPIKE VIAL GLASS SM (MISCELLANEOUS) ×2 IMPLANT
DERMABOND ADVANCED (GAUZE/BANDAGES/DRESSINGS) ×1
DERMABOND ADVANCED .7 DNX12 (GAUZE/BANDAGES/DRESSINGS) ×1 IMPLANT
DRAPE C-ARM 42X72 X-RAY (DRAPES) ×2 IMPLANT
DRAPE LAPAROSCOPIC ABDOMINAL (DRAPES) ×2 IMPLANT
DRSG TEGADERM 4X4.75 (GAUZE/BANDAGES/DRESSINGS) IMPLANT
ELECT COATED BLADE 2.86 ST (ELECTRODE) ×2 IMPLANT
ELECT REM PT RETURN 9FT ADLT (ELECTROSURGICAL) ×2
ELECTRODE REM PT RTRN 9FT ADLT (ELECTROSURGICAL) ×1 IMPLANT
GAUZE SPONGE 4X4 12PLY STRL LF (GAUZE/BANDAGES/DRESSINGS) ×2 IMPLANT
GLOVE BIO SURGEON STRL SZ7 (GLOVE) ×4 IMPLANT
GLOVE BIOGEL PI IND STRL 7.0 (GLOVE) ×1 IMPLANT
GLOVE BIOGEL PI IND STRL 7.5 (GLOVE) ×1 IMPLANT
GLOVE BIOGEL PI INDICATOR 7.0 (GLOVE) ×1
GLOVE BIOGEL PI INDICATOR 7.5 (GLOVE) ×1
GOWN PREVENTION PLUS XLARGE (GOWN DISPOSABLE) ×4 IMPLANT
IV HEPARIN 1000UNITS/500ML (IV SOLUTION) ×2 IMPLANT
IV KIT MINILOC 20X1 SAFETY (NEEDLE) IMPLANT
KIT PORT POWER 8FR ISP CVUE (Catheter) ×2 IMPLANT
NDL SAFETY ECLIPSE 18X1.5 (NEEDLE) IMPLANT
NEEDLE HYPO 18GX1.5 SHARP (NEEDLE)
NEEDLE HYPO 25X1 1.5 SAFETY (NEEDLE) ×2 IMPLANT
PACK BASIN DAY SURGERY FS (CUSTOM PROCEDURE TRAY) ×2 IMPLANT
PENCIL BUTTON HOLSTER BLD 10FT (ELECTRODE) ×2 IMPLANT
SLEEVE SCD COMPRESS KNEE MED (MISCELLANEOUS) ×2 IMPLANT
STAPLER VISISTAT 35W (STAPLE) ×2 IMPLANT
STRIP CLOSURE SKIN 1/2X4 (GAUZE/BANDAGES/DRESSINGS) ×2 IMPLANT
SUT MON AB 4-0 PC3 18 (SUTURE) ×2 IMPLANT
SUT PROLENE 2 0 SH DA (SUTURE) ×2 IMPLANT
SUT SILK 2 0 TIES 17X18 (SUTURE)
SUT SILK 2-0 18XBRD TIE BLK (SUTURE) IMPLANT
SUT VIC AB 3-0 SH 27 (SUTURE) ×1
SUT VIC AB 3-0 SH 27X BRD (SUTURE) ×1 IMPLANT
SYR 5ML LUER SLIP (SYRINGE) ×2 IMPLANT
SYR CONTROL 10ML LL (SYRINGE) ×2 IMPLANT
TOWEL OR 17X24 6PK STRL BLUE (TOWEL DISPOSABLE) ×2 IMPLANT
TOWEL OR NON WOVEN STRL DISP B (DISPOSABLE) ×2 IMPLANT
TUBE CONNECTING 20X1/4 (TUBING) IMPLANT
WATER STERILE IRR 1000ML POUR (IV SOLUTION) ×2 IMPLANT
YANKAUER SUCT BULB TIP NO VENT (SUCTIONS) IMPLANT

## 2013-01-27 NOTE — Op Note (Signed)
Preoperative diagnosis: Breast cancer s/p bilateral mastectomy Postoperative diagnosis: SAA Procedure: Left subclavian ClearVue PowerPort insertion Surgeon: Dr Harden Mo Anesthesia: General with LMA EBL: minimal Drains: none Complications: none Specimen: none Sponge and needle count correct times two Disposition to recovery in stable condition  Indications:This is a 48 yof who has recently undergone bilateral mastectomies as well as a sentinel node biopsy. She ends up being a candidate for chemotherapy and Herceptin. We discussed port placement versus benefits associated with it.  Procedure: After informed consent was obtained the patient was taken to the operating room. She was administered vancomycin due to her allergies. She had sequential compression devices on her legs. She was placed under general anesthesia with an LMA. Her arms were tucked appropriately padded. She was then prepped and draped in the standard sterile surgical fashion. Surgical timeout was performed.  I accessed her left subclavian vein on the first pass. The wire was placed. This was confirmed to be in correct position. She had some ectopy when I inserted this pulled the wire back. I then made a pocket overlying the pectoralis muscle below this. I passed the line between the two. I then dilated the tract. I then inserted the peel-away sheath with the dilator. I then removed the dilator. I did this under fluoroscopy. I then placed the line and removed the peel-away sheath. I pulled the line back to be in the distal cava. I then secured the line to the port. I sutured the port with 2-0 Prolene to the pectoralis muscle. I then flushed this with heparin. It aspirated blood easily. Then packed with concentrated heparin. I closed this with 3-0 Vicryl, 4 Monocryl, and Dermabond. She tolerated this well was extubated and transferred to recovery stable.

## 2013-01-27 NOTE — Anesthesia Postprocedure Evaluation (Signed)
  Anesthesia Post-op Note  Patient: Jane Gutierrez  Procedure(s) Performed: Procedure(s) (LRB) with comments: INSERTION PORT-A-CATH (Right)  Patient Location: PACU  Anesthesia Type:General  Level of Consciousness: awake  Airway and Oxygen Therapy: Patient Spontanous Breathing  Post-op Pain: mild  Post-op Assessment: Post-op Vital signs reviewed  Post-op Vital Signs: Reviewed  Complications: No apparent anesthesia complications 

## 2013-01-27 NOTE — Anesthesia Procedure Notes (Signed)
Procedure Name: LMA Insertion Date/Time: 01/27/2013 10:35 AM Performed by: Gar Gibbon Pre-anesthesia Checklist: Patient identified, Emergency Drugs available, Suction available and Patient being monitored Patient Re-evaluated:Patient Re-evaluated prior to inductionOxygen Delivery Method: Circle System Utilized Preoxygenation: Pre-oxygenation with 100% oxygen Intubation Type: IV induction Ventilation: Mask ventilation without difficulty LMA: LMA inserted LMA Size: 4.0 Number of attempts: 1 Airway Equipment and Method: bite block Placement Confirmation: positive ETCO2 Tube secured with: Tape Dental Injury: Teeth and Oropharynx as per pre-operative assessment

## 2013-01-27 NOTE — Anesthesia Preprocedure Evaluation (Addendum)
Anesthesia Evaluation  Patient identified by MRN, date of birth, ID band Patient awake    Reviewed: Allergy & Precautions, H&P , NPO status , Patient's Chart, lab work & pertinent test results  Airway Mallampati: II      Dental   Pulmonary neg pulmonary ROS, asthma ,  breath sounds clear to auscultation        Cardiovascular Rhythm:Regular Rate:Normal  Cardiac note and echo noted.   Neuro/Psych    GI/Hepatic negative GI ROS,   Endo/Other  negative endocrine ROSHypothyroidism   Renal/GU negative Renal ROS     Musculoskeletal   Abdominal   Peds  Hematology   Anesthesia Other Findings   Reproductive/Obstetrics                          Anesthesia Physical Anesthesia Plan  ASA: III  Anesthesia Plan: General   Post-op Pain Management:    Induction: Intravenous  Airway Management Planned: LMA  Additional Equipment:   Intra-op Plan:   Post-operative Plan:   Informed Consent: I have reviewed the patients History and Physical, chart, labs and discussed the procedure including the risks, benefits and alternatives for the proposed anesthesia with the patient or authorized representative who has indicated his/her understanding and acceptance.   Dental advisory given (Risks discussed in detail of dental injury with surgery . Patient understands and case discussed with Dr. Dwain Sarna. )  Plan Discussed with: CRNA, Anesthesiologist and Surgeon  Anesthesia Plan Comments:       Anesthesia Quick Evaluation

## 2013-01-27 NOTE — Transfer of Care (Signed)
Immediate Anesthesia Transfer of Care Note  Patient: Jane Gutierrez  Procedure(s) Performed: Procedure(s) (LRB) with comments: INSERTION PORT-A-CATH (Right)  Patient Location: PACU  Anesthesia Type:General  Level of Consciousness: awake and patient cooperative  Airway & Oxygen Therapy: Patient Spontanous Breathing and Patient connected to face mask oxygen  Post-op Assessment: Report given to PACU RN and Post -op Vital signs reviewed and stable  Post vital signs: Reviewed and stable  Complications: No apparent anesthesia complications

## 2013-01-27 NOTE — H&P (View-Only) (Signed)
Subjective:     Patient ID: Jane Gutierrez, female   DOB: 01/28/1956, 56 y.o.   MRN: [id number]  HPI 56 yof with significant fh who underwent bilateral mastectomy for bilateral breast cancer.  Path on the left ends up being a stage II node negative her 2 positive ((6.81),er/pr positive (99/10) tumor.  The right was stage I idc.  She underwent bilateral expander based reconstruction at the same time.  She has done really well.  Only taking some advil right now.   Review of Systems     Objective:   Physical Exam Healing incisions (vertical due to prior surgery) with healthy skin now, expanders in place with expected drain output    Assessment:     S/p bilateral mastectomy, snbx    Plan:     She is doing very well.  This is her2 positive and she has seen med onc with recommendation for adjuvant chemo/herceptin.  She is going to get second opinion prior to beginning.  She shouldn't need xrt and is undergoing expansion with Dr. Sanger. She and I discussed port placement with risks/benefits and will tentatively plan.  Will also refer to pt/lymphedema prevention once cleared with plastic surgery.      

## 2013-01-27 NOTE — Interval H&P Note (Signed)
History and Physical Interval Note:  01/27/2013 10:21 AM  Jane Gutierrez  has presented today for surgery, with the diagnosis of breast cancer  The various methods of treatment have been discussed with the patient and family. After consideration of risks, benefits and other options for treatment, the patient has consented to  Procedure(s) (LRB) with comments: INSERTION PORT-A-CATH (N/A) as a surgical intervention .  The patient's history has been reviewed, patient examined, no change in status, stable for surgery.  I have reviewed the patient's chart and labs.  Questions were answered to the patient's satisfaction.     Devora Tortorella

## 2013-01-27 NOTE — Anesthesia Postprocedure Evaluation (Signed)
  Anesthesia Post-op Note  Patient: Jane Gutierrez  Procedure(s) Performed: Procedure(s) (LRB) with comments: INSERTION PORT-A-CATH (Right)  Patient Location: PACU  Anesthesia Type:General  Level of Consciousness: awake  Airway and Oxygen Therapy: Patient Spontanous Breathing  Post-op Pain: mild  Post-op Assessment: Post-op Vital signs reviewed  Post-op Vital Signs: Reviewed  Complications: No apparent anesthesia complications

## 2013-01-28 ENCOUNTER — Encounter: Payer: Self-pay | Admitting: Oncology

## 2013-01-28 ENCOUNTER — Encounter (HOSPITAL_BASED_OUTPATIENT_CLINIC_OR_DEPARTMENT_OTHER): Payer: Self-pay | Admitting: General Surgery

## 2013-01-28 ENCOUNTER — Telehealth (INDEPENDENT_AMBULATORY_CARE_PROVIDER_SITE_OTHER): Payer: Self-pay | Admitting: General Surgery

## 2013-01-28 NOTE — Telephone Encounter (Signed)
Spoke with patient she stated no f/u po appt needed unless she has a problem with porta cath . She states Dr Dwain Sarna did a great job and she is feeling wonderful.

## 2013-01-28 NOTE — Progress Notes (Signed)
Received co pay card confirmation from [facility] for [facility]== Case id [id number]  Card id# [id number] payer [phone]

## 2013-02-04 ENCOUNTER — Ambulatory Visit (HOSPITAL_BASED_OUTPATIENT_CLINIC_OR_DEPARTMENT_OTHER): Payer: 59

## 2013-02-04 ENCOUNTER — Ambulatory Visit (HOSPITAL_BASED_OUTPATIENT_CLINIC_OR_DEPARTMENT_OTHER): Payer: 59 | Admitting: Oncology

## 2013-02-04 ENCOUNTER — Other Ambulatory Visit (HOSPITAL_BASED_OUTPATIENT_CLINIC_OR_DEPARTMENT_OTHER): Payer: 59 | Admitting: Lab

## 2013-02-04 VITALS — BP 132/86 | HR 70 | Temp 97.6°F | Resp 18 | Ht 65.0 in | Wt 160.1 lb

## 2013-02-04 VITALS — BP 139/94 | HR 69 | Temp 97.5°F | Resp 18

## 2013-02-04 DIAGNOSIS — C50419 Malignant neoplasm of upper-outer quadrant of unspecified female breast: Secondary | ICD-10-CM

## 2013-02-04 DIAGNOSIS — Z5111 Encounter for antineoplastic chemotherapy: Secondary | ICD-10-CM

## 2013-02-04 DIAGNOSIS — R002 Palpitations: Secondary | ICD-10-CM

## 2013-02-04 DIAGNOSIS — C50119 Malignant neoplasm of central portion of unspecified female breast: Secondary | ICD-10-CM

## 2013-02-04 DIAGNOSIS — Z170 Estrogen receptor positive status [ER+]: Secondary | ICD-10-CM

## 2013-02-04 DIAGNOSIS — R51 Headache: Secondary | ICD-10-CM

## 2013-02-04 LAB — CBC WITH DIFFERENTIAL/PLATELET
BASO%: 0.4 % (ref 0.0–2.0)
EOS%: 5 % (ref 0.0–7.0)
HCT: 39.1 % (ref 34.8–46.6)
LYMPH%: 47.1 % (ref 14.0–49.7)
MCH: 28.2 pg (ref 25.1–34.0)
MCHC: 34.3 g/dL (ref 31.5–36.0)
MONO#: 0.8 10*3/uL (ref 0.1–0.9)
NEUT%: 38.1 % — ABNORMAL LOW (ref 38.4–76.8)
Platelets: 194 10*3/uL (ref 145–400)

## 2013-02-04 LAB — COMPREHENSIVE METABOLIC PANEL (CC13)
AST: 19 U/L (ref 5–34)
BUN: 22.9 mg/dL (ref 7.0–26.0)
Calcium: 9.1 mg/dL (ref 8.4–10.4)
Chloride: 104 mEq/L (ref 98–107)
Creatinine: 0.6 mg/dL (ref 0.6–1.1)
Glucose: 87 mg/dl (ref 70–99)

## 2013-02-04 MED ORDER — DOCETAXEL CHEMO INJECTION 160 MG/16ML
75.0000 mg/m2 | Freq: Once | INTRAVENOUS | Status: AC
  Administered 2013-02-04: 140 mg via INTRAVENOUS
  Filled 2013-02-04: qty 14

## 2013-02-04 MED ORDER — SODIUM CHLORIDE 0.9 % IV SOLN
750.0000 mg | Freq: Once | INTRAVENOUS | Status: AC
  Administered 2013-02-04: 750 mg via INTRAVENOUS
  Filled 2013-02-04: qty 75

## 2013-02-04 MED ORDER — ONDANSETRON 16 MG/50ML IVPB (CHCC)
16.0000 mg | Freq: Once | INTRAVENOUS | Status: AC
  Administered 2013-02-04: 16 mg via INTRAVENOUS

## 2013-02-04 MED ORDER — SODIUM CHLORIDE 0.9 % IV SOLN
Freq: Once | INTRAVENOUS | Status: AC
  Administered 2013-02-04: 13:00:00 via INTRAVENOUS

## 2013-02-04 MED ORDER — DEXAMETHASONE SODIUM PHOSPHATE 4 MG/ML IJ SOLN
20.0000 mg | Freq: Once | INTRAMUSCULAR | Status: AC
  Administered 2013-02-04: 20 mg via INTRAVENOUS

## 2013-02-04 MED ORDER — TRASTUZUMAB CHEMO INJECTION 440 MG
4.0000 mg/kg | Freq: Once | INTRAVENOUS | Status: AC
  Administered 2013-02-04: 294 mg via INTRAVENOUS
  Filled 2013-02-04: qty 14

## 2013-02-04 MED ORDER — SODIUM CHLORIDE 0.9 % IJ SOLN
10.0000 mL | INTRAMUSCULAR | Status: DC | PRN
  Administered 2013-02-04: 10 mL
  Filled 2013-02-04: qty 10

## 2013-02-04 MED ORDER — HEPARIN SOD (PORK) LOCK FLUSH 100 UNIT/ML IV SOLN
500.0000 [IU] | Freq: Once | INTRAVENOUS | Status: AC | PRN
  Administered 2013-02-04: 500 [IU]
  Filled 2013-02-04: qty 5

## 2013-02-04 MED ORDER — DIPHENHYDRAMINE HCL 25 MG PO CAPS
50.0000 mg | ORAL_CAPSULE | Freq: Once | ORAL | Status: AC
  Administered 2013-02-04: 50 mg via ORAL

## 2013-02-04 MED ORDER — ACETAMINOPHEN 325 MG PO TABS
650.0000 mg | ORAL_TABLET | Freq: Once | ORAL | Status: AC
  Administered 2013-02-04: 650 mg via ORAL

## 2013-02-04 NOTE — Progress Notes (Signed)
OFFICE PROGRESS NOTE  CC  Jane Hy, MD [address] [place] [address] Dr. Emelia Loron Dr. Chipper Herb Dr. Starleen Arms Dr. Dorothy Puffer  DIAGNOSIS: 57 year old female who presented on 12/24/2012 which new diagnosis of breast cancer  PRIOR THERAPY:  #1 patient has multiple medical problems but recently she had a mammogram performed that showed a 5.4 cm area of calcifications. MRI of the breasts showed the area extended to about 7.2 cm. Needle core biopsy performed showed a high-grade DCIS that was ER positive PR positive.  #2 she was seen by Dr. Emelia Loron regarding surgical options. Patient wanted to have bilateral mastectomies. She went on to have this performed on 12/31/2012. The final pathology did reveal a 2.3 cm invasive ductal carcinoma in the left breast. The tumor was ER +100% PR +86% HER-2/neu was amplified with a ratio of 6.81. Ki-67 was elevated at 46%. She has had immediate reconstruction.  #3 patient is seen back in medical oncology for discussion of adjuvant treatment. Since patient is HER-2/neu positive recommendation is her 2-based therapy consisting of Taxotere carboplatinum and Herceptin. The Taxotere or and carboplatinum will be given every 3 weeks for 6 cycles. She will receive Neulasta on day 2. Herceptin will be given weekly with duration of chemotherapy and then every 3 weeks to finish out a one-year of treatment. Patient also understands that she will be placed on antiestrogen therapy with an aromatase inhibitor or tamoxifen.  CURRENT THERAPY: Today is day 1 cycle 1 of the patient's 6 planned cycles of trastuzumab/ carboplatin/ docetaxel [facility] chemotherapy)  INTERVAL HISTORY: Jane Gutierrez returns today with her twin sister had her for followup of Jane Gutierrez's breast cancer. She starting her chemotherapy today. She has a very good understanding of her supportive medications, but is very concerned because Decadron causes her to have headaches and  palpitations and when she took is just 4 mg yesterday it was "more than she could deal with". This is discussed further below. She is having a little bit of shoulder pain associated with her port placement, but this is minor and she has an excellent range of motion. She had questions regarding vitamins and analgesics, and whether it was safe to start the Neulasta less than 24 hours after the completion of her chemotherapy. Otherwise today a detailed review of systems was entirely negative, and she continues to work and exercise "full blast". --Incidentally her sister and she were both tested for the BRCA mutation 2 weeks ago and results are pending  MEDICAL HISTORY: Past Medical History  Diagnosis Date  . IBS (irritable bowel syndrome)   . ADD (attention deficit disorder)   . Lyme disease   . Contact lens/glasses fitting     wears contacts or glasses  . Breast cancer 12/04/12    left, ER/PR +  . Allergy   . Asthma   . Thyroid disease   . Hypothyroidism     ALLERGIES:  is allergic to betadine; iodine; milk-related compounds; penicillins; shellfish-derived products; sulfamethoxazole w-trimethoprim; and cleocin.  MEDICATIONS:  Current Outpatient Prescriptions  Medication Sig Dispense Refill  . albuterol (PROVENTIL HFA;VENTOLIN HFA) 108 (90 BASE) MCG/ACT inhaler Inhale 2 puffs into the lungs every 6 (six) hours as needed.      . Cholecalciferol (VITAMIN D) 2000 UNITS CAPS Take 1 capsule by mouth daily.      [name] dexamethasone (DECADRON) 4 MG tablet Take 2 tablets (8 mg total) by mouth 2 (two) times daily with a meal. Take two times a day the  day before Taxotere. Then take two times a day starting the day after chemo for 3 days.  30 tablet  1  . dextroamphetamine (DEXTROSTAT) 5 MG tablet Take 5 mg by mouth 3 (three) times daily.      . eszopiclone (LUNESTA) 2 MG TABS Take 2 mg by mouth at bedtime. Take immediately before bedtime      . famotidine (PEPCID) 20 MG tablet Take 20 mg by mouth as  needed.      [name] ibuprofen (ADVIL,MOTRIN) 100 MG tablet Take 200 mg by mouth every 6 (six) hours as needed.      [name] levothyroxine (SYNTHROID, LEVOTHROID) 100 MCG tablet Take 100 mcg by mouth daily.      [name] lidocaine-prilocaine (EMLA) cream Apply cream to PAC site 1-2 hours prior to procedure.  30 g  1  . LORazepam (ATIVAN) 0.5 MG tablet Take 1 tablet (0.5 mg total) by mouth every 6 (six) hours as needed (Nausea or vomiting).  30 tablet  0  . Multiple Vitamin (MULTIVITAMIN) tablet Take 1 tablet by mouth daily.      . ondansetron (ZOFRAN) 8 MG tablet Take 1 tablet (8 mg total) by mouth 2 (two) times daily. Take two times a day starting the day after chemo for 3 days. Then take two times a day as needed for nausea or vomiting.  30 tablet  1  . oxyCODONE-acetaminophen (ROXICET) 5-325 MG per tablet Take 1 tablet by mouth every 4 (four) hours as needed for pain.  10 tablet  0  . prochlorperazine (COMPAZINE) 10 MG tablet Take 1 tablet (10 mg total) by mouth every 6 (six) hours as needed (Nausea or vomiting).  30 tablet  1  . prochlorperazine (COMPAZINE) 25 MG suppository Place 1 suppository (25 mg total) rectally every 12 (twelve) hours as needed for nausea.  12 suppository  3  . promethazine (PHENERGAN) 25 MG tablet Take 25 mg by mouth every 6 (six) hours as needed.      . vitamin C (ASCORBIC ACID) 500 MG tablet Take 500 mg by mouth daily.       No current facility-administered medications for this visit.    SURGICAL HISTORY:  Past Surgical History  Procedure Laterality Date  . Brain surgery      removal benign frontal lobe cyst in 1987  . Foot surgery  2013    bilateral  . Gynecologic cryosurgery      cervical cancer, 1998 last remained disease free with recent normal pap  . Diagnostic laparoscopy  2004    lysis adh-uterine ablation  . Breast surgery      bilateral mastopexy  . Breast surgery      removal benign left breast lesion  . Abdominal hysterectomy  2005    TAH/BSO-lysis adh,  enometriosis  . Face lift    . Axillary sentinel node biopsy  12/31/2012    Procedure: AXILLARY SENTINEL NODE BIOPSY;  Surgeon: Emelia Loron, MD;  Location: [facility];  Service: General;  Laterality: Bilateral;  bilateral sentinel node  . Total mastectomy  12/31/2012    Procedure: TOTAL MASTECTOMY;  Surgeon: Emelia Loron, MD;  Location: [facility];  Service: General;  Laterality: Bilateral;  bilateral total mastectomies  . Breast reconstruction with placement of tissue expander and flex hd (acellular hydrated dermis)  12/31/2012    Procedure: BREAST RECONSTRUCTION WITH PLACEMENT OF TISSUE EXPANDER AND FLEX HD (ACELLULAR HYDRATED DERMIS);  Surgeon: Wayland Denis, DO;  Location: [facility];  Service: Government social research officer;  Laterality: Bilateral;  bilateral immediate breast reconstruction with expanders and flex hd   . Portacath placement  01/27/2013    Procedure: INSERTION PORT-A-CATH;  Surgeon: Emelia Loron, MD;  Location: [facility];  Service: General;  Laterality: Right;    REVIEW OF SYSTEMS:  See "interval history" above  HEALTH MAINTENANCE:   PHYSICAL EXAMINATION: Blood pressure 132/86, pulse 70, temperature 97.6 F (36.4 C), temperature source Oral, resp. rate 18, height 5\' 5"  (1.651 m), weight 160 lb 1 oz (72.604 kg). Body mass index is 26.64 kg/(m^2). ECOG PERFORMANCE STATUS: 1 - Symptomatic but completely ambulatory  Sclerae unicteric Oropharynx clear No cervical or supraclavicular adenopathy Lungs no rales or rhonchi Heart regular rate and rhythm Abd benign MSK no focal spinal tenderness, no peripheral edema Neuro: nonfocal Breasts: She status post bilateral mastectomies with expander is in place. The cosmetic result is symmetrical. The incisions are healing very nicely. Both axillae are benign.    LABORATORY DATA: Lab Results  Component Value Date   WBC 8.2 02/04/2013   HGB 13.4 02/04/2013   HCT 39.1 02/04/2013    MCV 82.3 02/04/2013   PLT 194 02/04/2013      Chemistry      Component Value Date/Time   NA 139 12/24/2012 1535   K 4.1 12/24/2012 1535   CL 102 12/24/2012 1535   CO2 27 12/24/2012 1535   BUN 24.0 12/24/2012 1535   CREATININE 0.7 12/24/2012 1535      Component Value Date/Time   CALCIUM 9.4 12/24/2012 1535   ALKPHOS 38* 12/24/2012 1535   AST 23 12/24/2012 1535   ALT 21 12/24/2012 1535   BILITOT 0.38 12/24/2012 1535     12/31/2012 ADDITIONAL INFORMATION: 1. CHROMOGENIC IN-SITU HYBRIDIZATION Interpretation: HER2/NEU BY CISH - SHOWS AMPLIFICATION BY CISH ANALYSIS. THE RATIO OF HER2: CEP 17 SIGNALS WAS 6.81 Reference range: Ratio: HER2:CEP17 < 1.8 gene amplification not observed Ratio: HER2:CEP 17 1.8-2.2 - equivocal result Ratio: HER2:CEP17 > 2.2 - gene amplification observed Pecola Leisure MD Pathologist, Electronic Signature ( Signed 01/11/2013) 1. PROGNOSTIC INDICATORS - ACIS Results IMMUNOHISTOCHEMICAL AND MORPHOMETRIC ANALYSIS BY THE AUTOMATED CELLULAR IMAGING SYSTEM (ACIS) Estrogen Receptor (Negative, <1%): 99%, STRONG STAINING INTENSITY Progesterone Receptor (Negative, <1%): 10%, STRONG STAINING INTENSITY Proliferation Marker Ki67 by M IB-1 (Low<20%): 46% All controls stained appropriately Pecola Leisure MD Pathologist, Electronic Signature ( Signed 01/06/2013) 1 of 4 FINAL for HULL, Erma G [phone]) FINAL DIAGNOSIS Diagnosis 1. Breast, simple mastectomy, Left - INVASIVE DUCTAL CARCINOMA, GRADE II (2.3 CM), SEE COMMENT. - LYMPHOVASCULAR INVASION IDENTIFIED - INVASIVE TUMOR IS 3 CM FROM THE NEAREST MARGIN (DEEP). - HIGH GRADE DUCTAL CARCINOMA IN SITU WITH COMEDO NECROSIS AND CALCIFICATIONS. - SEE TUMOR SYNOPTIC TEMPLATE BELOW. 2. Lymph node, sentinel, biopsy, Left axillary - ONE LYMPH NODE, NEGATIVE FOR TUMOR (0/1). 3. Breast, simple mastectomy, Right - BENIGN BREAST TISSUE, SEE COMMENT. - NEGATIVE FOR ATYPIA OR MALIGNANCY. 4. Lymph node, sentinel, biopsy, Right axillary - ONE LYMPH  NODE, NEGATIVE FOR TUMOR (0/1). Microscopic Comment 1. BREAST, INVASIVE TUMOR, WITH LYMPH NODE SAMPLING Specimen, including laterality: Left breast Procedure: Simple mastectomy Grade: II of III Tubule formation: 3 Nuclear pleomorphism: 2 Mitotic: 1 Tumor size (gross measurement) 2.3 cm Margins: Invasive, distance to closest margin: 3.0 cm In-situ, distance to closest margin: 3.0 cm (deep) If margin positive, focally or broadly: N/A Lymphovascular invasion: Present, extensive Ductal carcinoma in situ: Present Grade: III of III Extensive intraductal component: Absent Lobular neoplasia: Absent Tumor focality: Unifocal Treatment effect: None  If present, treatment effect in breast tissue, lymph nodes or both: N/A Extent of tumor: Skin: Grossly negative for tumor Nipple: Grossly negative for tumor Skeletal muscle: Microscopically negative for tumor Lymph nodes: # examined: 1 Lymph nodes with metastasis: 0 Breast prognostic profile: Estrogen receptor: Repeated, previous study shows 100% positivity. [phone]) Progesterone receptor: Repeated, previous study demonstrated 86% positivity ([id number]) Her 2 neu: Pending and will be reported in an addendum. Ki-67: Pending and will be reported in an addendum. Non-neoplastic breast: No significant findings. TNM: pT2, pN0, pMX Comments: None. (CR:kh 01-04-13) 3. The entire 2.0 cm previous biopsy hematoma is submitted for histopathologic review. On review, there is 2 of 4 FINAL for HULL, Odeth G [phone]) Microscopic Comment(continued) no in situ or invasive carcinoma identified in any of the slide sections examined. Additional non-neoplastic findings include benign fibrocystic change, pseudoangiomatous stromal hyperplasia, fibroadenomatoid nodules, periductal chronic inflammation with associated stromal fibrosis and microcalcifications in benign ducts and lobules. Pathologic Stage is pTX pN0, PMX. The surgical resection margin(s) of  the specimen were inked and microscopically evaluated. [place] RU RADIOGRAPHIC STUDIES:  Mr Biopsy/wire Localization  12/25/2012  *RADIOLOGY REPORT*  Clinical Data:  6 mm enhancing mass in the upper right breast.  The patient has recent diagnosis of left breast DCIS.  MRI GUIDED VACUUM ASSISTED BIOPSY OF THE RIGHT BREAST WITHOUT AND WITH CONTRAST  Comparison: Previous exams.  Technique: Multiplanar, multisequence MR images of the right breast were obtained prior to and following the intravenous administration of 15 ml of Mulithance.  I met with the patient, and we discussed the procedure of MRI guided biopsy, including risks, benefits, and alternatives. Specifically, we discussed the risks of infection, bleeding, tissue injury, clip migration, and inadequate sampling.  Informed, written consent was given.  Using sterile technique, 2% Lidocaine, MRI guidance, and a 9 gauge vacuum assisted device, biopsy was performed of a 6 mm enhancing mass using a lateral to medial approach.  At the conclusion of the procedure, a tissue marker clip was deployed into the biopsy cavity.  IMPRESSION: MRI guided biopsy of the right breast. No apparent complications.  THREE-DIMENSIONAL MR IMAGE RENDERING ON INDEPENDENT WORKSTATION:  Three-dimensional MR images were rendered by post-processing of the original MR data on an independent workstation.  The three- dimensional MR images were interpreted, and findings were reported in the accompanying complete MRI report for this study.   Original Report Authenticated By: Britta Mccreedy, M.D.    Nm Sentinel Node Inj-no Rpt (breast)  12/31/2012  CLINICAL DATA: right axillary sentinel node biopsy   Sulfur colloid was injected intradermally by the nuclear medicine  technologist for breast cancer sentinel node localization.     Nm Sentinel Node Inj-no Rpt (breast)  12/31/2012  CLINICAL DATA: left axillary sentinel node biopsy   Sulfur colloid was injected intradermally by the nuclear medicine   technologist for breast cancer sentinel node localization.     Mm Digital Diagnostic Unilat R  12/25/2012  *RADIOLOGY REPORT*  Clinical Data:  MRI guided core needle biopsy of a 6 mm nodule of enhancement in the upper right breast was performed.  DIGITAL DIAGNOSTIC RIGHT MAMMOGRAM  Comparison:  Previous exams.  Findings:  Films are performed following MRI guided biopsy of 6 mm enhancing nodule in the upper central right breast.  A dumbbell shaped biopsy clip is present in the upper right breast just medial to the level of the nipple.  The clip is within a biopsy cavity that contains air and a small air-fluid level.  IMPRESSION:  Satisfactory position of biopsy clip in the upper right breast.   Original Report Authenticated By: Britta Mccreedy, M.D.     ASSESSMENT: 57 year old [place] woman  (1) status post bilateral mastectomies in 12/31/2012 showing  (a) on the right, no evidence of malignancy  (b) on the left, a pT2 pN0, stage IIA invasive ductal carcinoma, grade 2, estrogen receptor 100% positive, progesterone receptor 86% positive, with an MIB-1 of 46%, and HER-2 amplification with a CISH ratio of 6.81  (2) starting adjuvant chemotherapy with carboplatin, docetaxel and trastuzumab every 13 2014, to consist of 6 cycles.  (3) trastuzumab to be continued to total one year; most recent echocardiogram 01/26/2013  PLAN:  Pansy is ready to start her chemotherapy today. She is very concerned because the Decadron causes her palpitations and severe headaches. We will go over the fact that this medication as they're not only for nausea control, but also to prevent the "leakage syndrome" which, while rare, is quite a nuisance when it does occur. What she is going to do is try to take 2 mg in the morning and 2 mg in the evening, and if she tolerates it well she will try to about a couple of more 2 mg doses during the day. She is going to omit her amphetamine treatment to more morning when she takes her Claritin  and she understands that it is fine for her to take some Aleve or Motrin for the pain she may get from [facility] tomorrow. Her sister was concerned that we are giving the Neulasta less than 24 hours after the chemotherapy, but I think that is all right in this case. Otherwise she knows to call for any problems that may develop in she is a ready scheduled for a return visit in 1 week.   MAGRINAT,GUSTAV C

## 2013-02-04 NOTE — Patient Instructions (Signed)
[facility] Discharge Instructions for Patients Receiving Chemotherapy  Today you received the following chemotherapy agents: Taxotere, Carboplatin and Herceptin.  To help prevent nausea and vomiting after your treatment, we encourage you to take your nausea medication, Zofran and Decadron. Begin taking it the morning of 02/05/13 and take it as often as prescribed for the next 72 hours. Take Compazine every six hours as needed for nausea. May take without regard to last dose of Zofran/Decadron.  If you develop nausea and vomiting that is not controlled by your nausea medication, call the clinic. If it is after clinic hours your family physician or the after hours number for the clinic or go to the Emergency Department.   BELOW ARE SYMPTOMS THAT SHOULD BE REPORTED IMMEDIATELY:  *FEVER GREATER THAN 100.5 F  *CHILLS WITH OR WITHOUT FEVER  NAUSEA AND VOMITING THAT IS NOT CONTROLLED WITH YOUR NAUSEA MEDICATION  *UNUSUAL SHORTNESS OF BREATH  *UNUSUAL BRUISING OR BLEEDING  TENDERNESS IN MOUTH AND THROAT WITH OR WITHOUT PRESENCE OF ULCERS  *URINARY PROBLEMS  *BOWEL PROBLEMS  UNUSUAL RASH Items with * indicate a potential emergency and should be followed up as soon as possible.  One of the nurses will contact you 24 hours after your treatment. Please let the nurse know about any problems that you may have experienced. Feel free to call the clinic you have any questions or concerns. The clinic phone number is [phone].   I have been informed and understand all the instructions given to me. I know to contact the clinic, my physician, or go to the Emergency Department if any problems should occur. I do not have any questions at this time, but understand that I may call the clinic during office hours   should I have any questions or need assistance in obtaining follow up care.

## 2013-02-05 ENCOUNTER — Ambulatory Visit (HOSPITAL_BASED_OUTPATIENT_CLINIC_OR_DEPARTMENT_OTHER): Payer: 59

## 2013-02-05 ENCOUNTER — Telehealth: Payer: Self-pay | Admitting: *Deleted

## 2013-02-05 ENCOUNTER — Other Ambulatory Visit: Payer: Self-pay | Admitting: *Deleted

## 2013-02-05 ENCOUNTER — Other Ambulatory Visit: Payer: Self-pay | Admitting: Certified Registered Nurse Anesthetist

## 2013-02-05 ENCOUNTER — Other Ambulatory Visit: Payer: Self-pay | Admitting: Emergency Medicine

## 2013-02-05 VITALS — BP 135/83 | HR 79 | Temp 97.5°F

## 2013-02-05 DIAGNOSIS — Z5189 Encounter for other specified aftercare: Secondary | ICD-10-CM

## 2013-02-05 DIAGNOSIS — C50119 Malignant neoplasm of central portion of unspecified female breast: Secondary | ICD-10-CM

## 2013-02-05 DIAGNOSIS — C50419 Malignant neoplasm of upper-outer quadrant of unspecified female breast: Secondary | ICD-10-CM

## 2013-02-05 MED ORDER — PEGFILGRASTIM INJECTION 6 MG/0.6ML
6.0000 mg | Freq: Once | SUBCUTANEOUS | Status: AC
  Administered 2013-02-05: 6 mg via SUBCUTANEOUS
  Filled 2013-02-05: qty 0.6

## 2013-02-05 MED ORDER — LORAZEPAM 0.5 MG PO TABS: 0.5000 mg | ORAL_TABLET | Freq: Four times a day (QID) | ORAL | Status: DC | PRN

## 2013-02-05 NOTE — Telephone Encounter (Signed)
Jane Gutierrez here for Neulasta injection following 1st tch chemo treatment.  Is doing good, just a little tired from being here all day yesterday.  No nausea,  Vomiting, or diarrhea.  Drinking lots of fluids and eating well.  All questions answered.  Knows to call the clinic for any problems or concerns.

## 2013-02-07 ENCOUNTER — Other Ambulatory Visit: Payer: Self-pay

## 2013-02-11 ENCOUNTER — Ambulatory Visit (HOSPITAL_BASED_OUTPATIENT_CLINIC_OR_DEPARTMENT_OTHER): Payer: 59 | Admitting: Adult Health

## 2013-02-11 ENCOUNTER — Other Ambulatory Visit: Payer: 59 | Admitting: Lab

## 2013-02-11 ENCOUNTER — Ambulatory Visit (HOSPITAL_BASED_OUTPATIENT_CLINIC_OR_DEPARTMENT_OTHER): Payer: 59

## 2013-02-11 ENCOUNTER — Encounter: Payer: Self-pay | Admitting: Adult Health

## 2013-02-11 VITALS — BP 146/84 | HR 106 | Temp 97.8°F | Resp 20 | Ht 65.0 in | Wt 158.7 lb

## 2013-02-11 DIAGNOSIS — K1370 Unspecified lesions of oral mucosa: Secondary | ICD-10-CM

## 2013-02-11 DIAGNOSIS — C50419 Malignant neoplasm of upper-outer quadrant of unspecified female breast: Secondary | ICD-10-CM

## 2013-02-11 DIAGNOSIS — R197 Diarrhea, unspecified: Secondary | ICD-10-CM

## 2013-02-11 DIAGNOSIS — Z5112 Encounter for antineoplastic immunotherapy: Secondary | ICD-10-CM

## 2013-02-11 DIAGNOSIS — Z170 Estrogen receptor positive status [ER+]: Secondary | ICD-10-CM

## 2013-02-11 LAB — CBC WITH DIFFERENTIAL/PLATELET
Basophils Absolute: 0.1 10*3/uL (ref 0.0–0.1)
EOS%: 1.7 % (ref 0.0–7.0)
Eosinophils Absolute: 0.2 10*3/uL (ref 0.0–0.5)
HCT: 39.3 % (ref 34.8–46.6)
HGB: 13.5 g/dL (ref 11.6–15.9)
MCH: 28.1 pg (ref 25.1–34.0)
MONO#: 1.9 10*3/uL — ABNORMAL HIGH (ref 0.1–0.9)
NEUT#: 3 10*3/uL (ref 1.5–6.5)
NEUT%: 33.5 % — ABNORMAL LOW (ref 38.4–76.8)
lymph#: 3.7 10*3/uL — ABNORMAL HIGH (ref 0.9–3.3)

## 2013-02-11 LAB — COMPREHENSIVE METABOLIC PANEL (CC13)
Albumin: 3.3 g/dL — ABNORMAL LOW (ref 3.5–5.0)
BUN: 12.9 mg/dL (ref 7.0–26.0)
CO2: 27 mEq/L (ref 22–29)
Calcium: 9.2 mg/dL (ref 8.4–10.4)
Chloride: 102 mEq/L (ref 98–107)
Glucose: 107 mg/dl — ABNORMAL HIGH (ref 70–99)
Potassium: 3.7 mEq/L (ref 3.5–5.1)
Sodium: 138 mEq/L (ref 136–145)
Total Protein: 6.6 g/dL (ref 6.4–8.3)

## 2013-02-11 MED ORDER — TRASTUZUMAB CHEMO INJECTION 440 MG
2.0000 mg/kg | Freq: Once | INTRAVENOUS | Status: AC
  Administered 2013-02-11: 147 mg via INTRAVENOUS
  Filled 2013-02-11: qty 7

## 2013-02-11 MED ORDER — ACETAMINOPHEN 325 MG PO TABS
650.0000 mg | ORAL_TABLET | Freq: Once | ORAL | Status: AC
  Administered 2013-02-11: 650 mg via ORAL

## 2013-02-11 MED ORDER — DIPHENHYDRAMINE HCL 25 MG PO CAPS
50.0000 mg | ORAL_CAPSULE | Freq: Once | ORAL | Status: AC
  Administered 2013-02-11: 50 mg via ORAL

## 2013-02-11 NOTE — Progress Notes (Signed)
OFFICE PROGRESS NOTE  CC  [name], MD [address] [place] [address] Dr. Emelia Loron Dr. Chipper Herb Dr. Starleen Arms Dr. Dorothy Puffer  DIAGNOSIS: 57 year old female who presented on 12/24/2012 which new diagnosis of breast cancer  PRIOR THERAPY:  #1 patient has multiple medical problems but recently she had a mammogram performed that showed a 5.4 cm area of calcifications. MRI of the breasts showed the area extended to about 7.2 cm. Needle core biopsy performed showed a high-grade DCIS that was ER positive PR positive.  #2 she was seen by Dr. Emelia Loron regarding surgical options. Patient wanted to have bilateral mastectomies. She went on to have this performed on 12/31/2012. The final pathology did reveal a 2.3 cm invasive ductal carcinoma in the left breast. The tumor was ER +100% PR +86% HER-2/neu was amplified with a ratio of 6.81. Ki-67 was elevated at 46%. She has had immediate reconstruction.  #3 patient is seen back in medical oncology for discussion of adjuvant treatment. Since patient is HER-2/neu positive recommendation is her 2-based therapy consisting of Taxotere carboplatinum and Herceptin. The Taxotere or and carboplatinum will be given every 3 weeks for 6 cycles. She will receive Neulasta on day 2. Herceptin will be given weekly with duration of chemotherapy and then every 3 weeks to finish out a one-year of treatment. Patient also understands that she will be placed on antiestrogen therapy with an aromatase inhibitor or tamoxifen.  CURRENT THERAPY: Today is day 8 cycle 1 of TCH  INTERVAL HISTORY:  Jane Gutierrez is doing well today.  She is day 8 of her first cycle of TCH.  She has sores in her mouth and burning with certain foods, and diarrhea 2-3 times per day.  She is doing well with her intake, and has had one episode of brief epistaxis.  Otherwise, she's doing well and a 10 point ROS is neg.  She denies fevers, chills, or any other concerns.    MEDICAL HISTORY: Past Medical History  Diagnosis Date  . IBS (irritable bowel syndrome)   . ADD (attention deficit disorder)   . Lyme disease   . Contact lens/glasses fitting     wears contacts or glasses  . Breast cancer 12/04/12    left, ER/PR +  . Allergy   . Asthma   . Thyroid disease   . Hypothyroidism     ALLERGIES:  is allergic to betadine; iodine; milk-related compounds; penicillins; shellfish-derived products; sulfamethoxazole w-trimethoprim; and cleocin.  MEDICATIONS:  Current Outpatient Prescriptions  Medication Sig Dispense Refill  . albuterol (PROVENTIL HFA;VENTOLIN HFA) 108 (90 BASE) MCG/ACT inhaler Inhale 2 puffs into the lungs every 6 (six) hours as needed.      [name] dexamethasone (DECADRON) 4 MG tablet Take 2 tablets (8 mg total) by mouth 2 (two) times daily with a meal. Take two times a day the day before Taxotere. Then take two times a day starting the day after chemo for 3 days.  30 tablet  1  . dextroamphetamine (DEXTROSTAT) 5 MG tablet Take 5 mg by mouth 3 (three) times daily.      . eszopiclone (LUNESTA) 2 MG TABS Take 2 mg by mouth at bedtime. Take immediately before bedtime      . famotidine (PEPCID) 20 MG tablet Take 20 mg by mouth as needed.      [name] levothyroxine (SYNTHROID, LEVOTHROID) 100 MCG tablet Take 100 mcg by mouth daily.      [name] lidocaine-prilocaine (EMLA) cream Apply cream to PAC site  1-2 hours prior to procedure.  30 g  1  . LORazepam (ATIVAN) 0.5 MG tablet Take 1 tablet (0.5 mg total) by mouth every 6 (six) hours as needed (Nausea or vomiting).  30 tablet  0  . ondansetron (ZOFRAN) 8 MG tablet Take 1 tablet (8 mg total) by mouth 2 (two) times daily. Take two times a day starting the day after chemo for 3 days. Then take two times a day as needed for nausea or vomiting.  30 tablet  1  . prochlorperazine (COMPAZINE) 10 MG tablet Take 1 tablet (10 mg total) by mouth every 6 (six) hours as needed (Nausea or vomiting).  30 tablet  1  . prochlorperazine  (COMPAZINE) 25 MG suppository Place 1 suppository (25 mg total) rectally every 12 (twelve) hours as needed for nausea.  12 suppository  3  . promethazine (PHENERGAN) 25 MG tablet Take 25 mg by mouth every 6 (six) hours as needed.       No current facility-administered medications for this visit.    SURGICAL HISTORY:  Past Surgical History  Procedure Laterality Date  . Brain surgery      removal benign frontal lobe cyst in 1987  . Foot surgery  2013    bilateral  . Gynecologic cryosurgery      cervical cancer, 1998 last remained disease free with recent normal pap  . Diagnostic laparoscopy  2004    lysis adh-uterine ablation  . Breast surgery      bilateral mastopexy  . Breast surgery      removal benign left breast lesion  . Abdominal hysterectomy  2005    TAH/BSO-lysis adh, enometriosis  . Face lift    . Axillary sentinel node biopsy  12/31/2012    Procedure: AXILLARY SENTINEL NODE BIOPSY;  Surgeon: Emelia Loron, MD;  Location: [facility];  Service: General;  Laterality: Bilateral;  bilateral sentinel node  . Total mastectomy  12/31/2012    Procedure: TOTAL MASTECTOMY;  Surgeon: Emelia Loron, MD;  Location: [facility];  Service: General;  Laterality: Bilateral;  bilateral total mastectomies  . Breast reconstruction with placement of tissue expander and flex hd (acellular hydrated dermis)  12/31/2012    Procedure: BREAST RECONSTRUCTION WITH PLACEMENT OF TISSUE EXPANDER AND FLEX HD (ACELLULAR HYDRATED DERMIS);  Surgeon: Wayland Denis, DO;  Location: [facility];  Service: Plastics;  Laterality: Bilateral;  bilateral immediate breast reconstruction with expanders and flex hd   . Portacath placement  01/27/2013    Procedure: INSERTION PORT-A-CATH;  Surgeon: Emelia Loron, MD;  Location: [facility];  Service: General;  Laterality: Right;    REVIEW OF SYSTEMS:  See "interval history" above  HEALTH  MAINTENANCE:   PHYSICAL EXAMINATION: Blood pressure 146/84, pulse 106, temperature 97.8 F (36.6 C), temperature source Oral, resp. rate 20, height 5\' 5"  (1.651 m), weight 158 lb 11.2 oz (71.986 kg). Body mass index is 26.41 kg/(m^2). ECOG PERFORMANCE STATUS: 1 - Symptomatic but completely ambulatory General: Patient is a well appearing female in no acute distress HEENT: PERRLA, sclerae anicteric no conjunctival pallor, MMM Neck: supple, no palpable adenopathy Lungs: clear to auscultation bilaterally, no wheezes, rhonchi, or rales Cardiovascular: regular rate rhythm, S1, S2, no murmurs, rubs or gallops Abdomen: Soft, non-tender, non-distended, normoactive bowel sounds, no HSM Extremities: warm and well perfused, no clubbing, cyanosis, or edema Skin: No rashes or lesions Neuro: Non-focal Breasts: She status post bilateral mastectomies with expander is in place. The cosmetic result is symmetrical.  The incisions are healing very nicely. Both axillae are benign.  LABORATORY DATA: Lab Results  Component Value Date   WBC 8.8 02/11/2013   HGB 13.5 02/11/2013   HCT 39.3 02/11/2013   MCV 81.7 02/11/2013   PLT 184 02/11/2013      Chemistry      Component Value Date/Time   NA 138 02/11/2013 1416   K 3.7 02/11/2013 1416   CL 102 02/11/2013 1416   CO2 27 02/11/2013 1416   BUN 12.9 02/11/2013 1416   CREATININE 0.6 02/11/2013 1416      Component Value Date/Time   CALCIUM 9.2 02/11/2013 1416   ALKPHOS 56 02/11/2013 1416   AST 44* 02/11/2013 1416   ALT 76* 02/11/2013 1416   BILITOT 0.20 02/11/2013 1416     12/31/2012 ADDITIONAL INFORMATION: 1. CHROMOGENIC IN-SITU HYBRIDIZATION Interpretation: HER2/NEU BY CISH - SHOWS AMPLIFICATION BY CISH ANALYSIS. THE RATIO OF HER2: CEP 17 SIGNALS WAS 6.81 Reference range: Ratio: HER2:CEP17 < 1.8 gene amplification not observed Ratio: HER2:CEP 17 1.8-2.2 - equivocal result Ratio: HER2:CEP17 > 2.2 - gene amplification observed Pecola Leisure MD Pathologist,  Electronic Signature ( Signed 01/11/2013) 1. PROGNOSTIC INDICATORS - ACIS Results IMMUNOHISTOCHEMICAL AND MORPHOMETRIC ANALYSIS BY THE AUTOMATED CELLULAR IMAGING SYSTEM (ACIS) Estrogen Receptor (Negative, <1%): 99%, STRONG STAINING INTENSITY Progesterone Receptor (Negative, <1%): 10%, STRONG STAINING INTENSITY Proliferation Marker Ki67 by M IB-1 (Low<20%): 46% All controls stained appropriately Pecola Leisure MD Pathologist, Electronic Signature ( Signed 01/06/2013) 1 of 4 FINAL for HULL, Elham G [phone]) FINAL DIAGNOSIS Diagnosis 1. Breast, simple mastectomy, Left - INVASIVE DUCTAL CARCINOMA, GRADE II (2.3 CM), SEE COMMENT. - LYMPHOVASCULAR INVASION IDENTIFIED - INVASIVE TUMOR IS 3 CM FROM THE NEAREST MARGIN (DEEP). - HIGH GRADE DUCTAL CARCINOMA IN SITU WITH COMEDO NECROSIS AND CALCIFICATIONS. - SEE TUMOR SYNOPTIC TEMPLATE BELOW. 2. Lymph node, sentinel, biopsy, Left axillary - ONE LYMPH NODE, NEGATIVE FOR TUMOR (0/1). 3. Breast, simple mastectomy, Right - BENIGN BREAST TISSUE, SEE COMMENT. - NEGATIVE FOR ATYPIA OR MALIGNANCY. 4. Lymph node, sentinel, biopsy, Right axillary - ONE LYMPH NODE, NEGATIVE FOR TUMOR (0/1). Microscopic Comment 1. BREAST, INVASIVE TUMOR, WITH LYMPH NODE SAMPLING Specimen, including laterality: Left breast Procedure: Simple mastectomy Grade: II of III Tubule formation: 3 Nuclear pleomorphism: 2 Mitotic: 1 Tumor size (gross measurement) 2.3 cm Margins: Invasive, distance to closest margin: 3.0 cm In-situ, distance to closest margin: 3.0 cm (deep) If margin positive, focally or broadly: N/A Lymphovascular invasion: Present, extensive Ductal carcinoma in situ: Present Grade: III of III Extensive intraductal component: Absent Lobular neoplasia: Absent Tumor focality: Unifocal Treatment effect: None If present, treatment effect in breast tissue, lymph nodes or both: N/A Extent of tumor: Skin: Grossly negative for tumor Nipple: Grossly negative  for tumor Skeletal muscle: Microscopically negative for tumor Lymph nodes: # examined: 1 Lymph nodes with metastasis: 0 Breast prognostic profile: Estrogen receptor: Repeated, previous study shows 100% positivity. [phone]) Progesterone receptor: Repeated, previous study demonstrated 86% positivity ([id number]) Her 2 neu: Pending and will be reported in an addendum. Ki-67: Pending and will be reported in an addendum. Non-neoplastic breast: No significant findings. TNM: pT2, pN0, pMX Comments: None. (CR:kh 01-04-13) 3. The entire 2.0 cm previous biopsy hematoma is submitted for histopathologic review. On review, there is 2 of 4 FINAL for HULL, Dalena G [phone]) Microscopic Comment(continued) no in situ or invasive carcinoma identified in any of the slide sections examined. Additional non-neoplastic findings include benign fibrocystic change, pseudoangiomatous stromal hyperplasia, fibroadenomatoid nodules, periductal chronic inflammation with associated stromal fibrosis  and microcalcifications in benign ducts and lobules. Pathologic Stage is pTX pN0, PMX. The surgical resection margin(s) of the specimen were inked and microscopically evaluated. [place] [facility] RADIOGRAPHIC STUDIES:  Mr Biopsy/wire Localization  12/25/2012  *RADIOLOGY REPORT*  Clinical Data:  6 mm enhancing mass in the upper right breast.  The patient has recent diagnosis of left breast DCIS.  MRI GUIDED VACUUM ASSISTED BIOPSY OF THE RIGHT BREAST WITHOUT AND WITH CONTRAST  Comparison: Previous exams.  Technique: Multiplanar, multisequence MR images of the right breast were obtained prior to and following the intravenous administration of 15 ml of Mulithance.  I met with the patient, and we discussed the procedure of MRI guided biopsy, including risks, benefits, and alternatives. Specifically, we discussed the risks of infection, bleeding, tissue injury, clip migration, and inadequate sampling.  Informed, written consent was given.   Using sterile technique, 2% Lidocaine, MRI guidance, and a 9 gauge vacuum assisted device, biopsy was performed of a 6 mm enhancing mass using a lateral to medial approach.  At the conclusion of the procedure, a tissue marker clip was deployed into the biopsy cavity.  IMPRESSION: MRI guided biopsy of the right breast. No apparent complications.  THREE-DIMENSIONAL MR IMAGE RENDERING ON INDEPENDENT WORKSTATION:  Three-dimensional MR images were rendered by post-processing of the original MR data on an independent workstation.  The three- dimensional MR images were interpreted, and findings were reported in the accompanying complete MRI report for this study.   Original Report Authenticated By: Britta Mccreedy, M.D.    Nm Sentinel Node Inj-no Rpt (breast)  12/31/2012  CLINICAL DATA: right axillary sentinel node biopsy   Sulfur colloid was injected intradermally by the nuclear medicine  technologist for breast cancer sentinel node localization.     Nm Sentinel Node Inj-no Rpt (breast)  12/31/2012  CLINICAL DATA: left axillary sentinel node biopsy   Sulfur colloid was injected intradermally by the nuclear medicine  technologist for breast cancer sentinel node localization.     Mm Digital Diagnostic Unilat R  12/25/2012  *RADIOLOGY REPORT*  Clinical Data:  MRI guided core needle biopsy of a 6 mm nodule of enhancement in the upper right breast was performed.  DIGITAL DIAGNOSTIC RIGHT MAMMOGRAM  Comparison:  Previous exams.  Findings:  Films are performed following MRI guided biopsy of 6 mm enhancing nodule in the upper central right breast.  A dumbbell shaped biopsy clip is present in the upper right breast just medial to the level of the nipple.  The clip is within a biopsy cavity that contains air and a small air-fluid level.  IMPRESSION: Satisfactory position of biopsy clip in the upper right breast.   Original Report Authenticated By: Britta Mccreedy, M.D.     ASSESSMENT: 56 year old [place] woman  (1) status  post bilateral mastectomies in 12/31/2012 showing  (a) on the right, no evidence of malignancy  (b) on the left, a pT2 pN0, stage IIA invasive ductal carcinoma, grade 2, estrogen receptor 100% positive, progesterone receptor 86% positive, with an MIB-1 of 46%, and HER-2 amplification with a CISH ratio of 6.81  (2) starting adjuvant chemotherapy with carboplatin, docetaxel and trastuzumab every 13 2014, to consist of 6 cycles.  (3) trastuzumab to be continued to total one year; most recent echocardiogram 01/26/2013  PLAN:   1. Georganne is doing well today.  WE will proceed with Herceptin.  She will continue her intake, and avoid citrus, spicy, and harsh foods to her mouth.  She will continue biotene and salt water rinses.  2. I will see her back next week for Herceptin.    All questions were answered. The patient knows to call the clinic with any problems, questions or concerns. We can certainly see the patient much sooner if necessary.   I spent 25 minutes counseling the patient face to face. The total time spent in the appointment was 30 minutes.    Cherie Ouch Lyn Hollingshead, NP Medical Oncology [facility] Phone: [phone]    [place], [place]

## 2013-02-11 NOTE — Patient Instructions (Addendum)
Doing well.  Proceed with Herceptin.  You can take one imodium tablet if you need it for diarrhea.  Please call us if you have any questions or concerns.

## 2013-02-11 NOTE — Patient Instructions (Addendum)
[facility] Discharge Instructions for Patients Receiving Chemotherapy  Today you received the following chemotherapy agents: Herceptin.  To help prevent nausea and vomiting after your treatment, we encourage you to take your nausea medication.   If you develop nausea and vomiting that is not controlled by your nausea medication, call the clinic.    BELOW ARE SYMPTOMS THAT SHOULD BE REPORTED IMMEDIATELY:  *FEVER GREATER THAN 100.5 F  *CHILLS WITH OR WITHOUT FEVER  NAUSEA AND VOMITING THAT IS NOT CONTROLLED WITH YOUR NAUSEA MEDICATION  *UNUSUAL SHORTNESS OF BREATH  *UNUSUAL BRUISING OR BLEEDING  TENDERNESS IN MOUTH AND THROAT WITH OR WITHOUT PRESENCE OF ULCERS  *URINARY PROBLEMS  *BOWEL PROBLEMS  UNUSUAL RASH Items with * indicate a potential emergency and should be followed up as soon as possible.  Feel free to call the clinic you have any questions or concerns. The clinic phone number is [phone].   Trastuzumab injection for infusion What is this medicine? TRASTUZUMAB (tras TOO zoo mab) is a monoclonal antibody. It targets a protein called HER2. This protein is found in some stomach and breast cancers. This medicine can stop cancer cell growth. This medicine may be used with other cancer treatments. This medicine may be used for other purposes; ask your health care provider or pharmacist if you have questions. What should I tell my health care provider before I take this medicine? They need to know if you have any of these conditions: -heart disease -heart failure -infection (especially a virus infection such as chickenpox, cold sores, or herpes) -lung or breathing disease, like asthma -recent or ongoing radiation therapy -an unusual or allergic reaction to trastuzumab, benzyl alcohol, or other medications, foods, dyes, or preservatives -pregnant or trying to get pregnant -breast-feeding How should I use this medicine? This drug is given as an  infusion into a vein. It is administered in a hospital or clinic by a specially trained health care professional. Talk to your pediatrician regarding the use of this medicine in children. This medicine is not approved for use in children. Overdosage: If you think you have taken too much of this medicine contact a poison control center or emergency room at once. NOTE: This medicine is only for you. Do not share this medicine with others. What if I miss a dose? It is important not to miss a dose. Call your doctor or health care professional if you are unable to keep an appointment. What may interact with this medicine? -cyclophosphamide -doxorubicin -warfarin This list may not describe all possible interactions. Give your health care provider a list of all the medicines, herbs, non-prescription drugs, or dietary supplements you use. Also tell them if you smoke, drink alcohol, or use illegal drugs. Some items may interact with your medicine. What should I watch for while using this medicine? Visit your doctor for checks on your progress. Report any side effects. Continue your course of treatment even though you feel ill unless your doctor tells you to stop. Call your doctor or health care professional for advice if you get a fever, chills or sore throat, or other symptoms of a cold or flu. Do not treat yourself. Try to avoid being around people who are sick. You may experience fever, chills and shaking during your first infusion. These effects are usually mild and can be treated with other medicines. Report any side effects during the infusion to your health care professional. Fever and chills usually do not happen with later infusions. What side effects  may I notice from receiving this medicine? Side effects that you should report to your doctor or other health care professional as soon as possible: -breathing difficulties -chest pain or palpitations -cough -dizziness or fainting -fever or chills,  sore throat -skin rash, itching or hives -swelling of the legs or ankles -unusually weak or tired Side effects that usually do not require medical attention (report to your doctor or other health care professional if they continue or are bothersome): -loss of appetite -headache -muscle aches -nausea This list may not describe all possible side effects. Call your doctor for medical advice about side effects. You may report side effects to FDA at 1-800-FDA-1088. Where should I keep my medicine? This drug is given in a hospital or clinic and will not be stored at home. NOTE: This sheet is a summary. It may not cover all possible information. If you have questions about this medicine, talk to your doctor, pharmacist, or health care provider.  2013, [employer] Standard. (10/13/2009 1:43:15 PM)

## 2013-02-12 ENCOUNTER — Other Ambulatory Visit: Payer: Self-pay | Admitting: Certified Registered Nurse Anesthetist

## 2013-02-15 ENCOUNTER — Encounter: Payer: Self-pay | Admitting: Adult Health

## 2013-02-18 ENCOUNTER — Ambulatory Visit (HOSPITAL_BASED_OUTPATIENT_CLINIC_OR_DEPARTMENT_OTHER): Payer: 59

## 2013-02-18 ENCOUNTER — Encounter: Payer: Self-pay | Admitting: Adult Health

## 2013-02-18 ENCOUNTER — Other Ambulatory Visit (HOSPITAL_BASED_OUTPATIENT_CLINIC_OR_DEPARTMENT_OTHER): Payer: 59 | Admitting: Lab

## 2013-02-18 ENCOUNTER — Ambulatory Visit (HOSPITAL_BASED_OUTPATIENT_CLINIC_OR_DEPARTMENT_OTHER): Payer: 59 | Admitting: Adult Health

## 2013-02-18 VITALS — BP 124/78 | HR 85 | Temp 97.8°F | Resp 20 | Ht 65.0 in | Wt 161.8 lb

## 2013-02-18 DIAGNOSIS — C50419 Malignant neoplasm of upper-outer quadrant of unspecified female breast: Secondary | ICD-10-CM

## 2013-02-18 DIAGNOSIS — Z170 Estrogen receptor positive status [ER+]: Secondary | ICD-10-CM

## 2013-02-18 DIAGNOSIS — C50412 Malignant neoplasm of upper-outer quadrant of left female breast: Secondary | ICD-10-CM

## 2013-02-18 DIAGNOSIS — Z5112 Encounter for antineoplastic immunotherapy: Secondary | ICD-10-CM

## 2013-02-18 LAB — CBC WITH DIFFERENTIAL/PLATELET
Basophils Absolute: 0 10*3/uL (ref 0.0–0.1)
EOS%: 0.2 % (ref 0.0–7.0)
Eosinophils Absolute: 0 10*3/uL (ref 0.0–0.5)
HGB: 13 g/dL (ref 11.6–15.9)
MONO%: 7.5 % (ref 0.0–14.0)
NEUT#: 6.4 10*3/uL (ref 1.5–6.5)
RBC: 4.65 10*6/uL (ref 3.70–5.45)
RDW: 14.2 % (ref 11.2–14.5)
lymph#: 3.3 10*3/uL (ref 0.9–3.3)
nRBC: 0 % (ref 0–0)

## 2013-02-18 LAB — COMPREHENSIVE METABOLIC PANEL (CC13)
AST: 31 U/L (ref 5–34)
BUN: 22.7 mg/dL (ref 7.0–26.0)
Calcium: 8.7 mg/dL (ref 8.4–10.4)
Chloride: 106 mEq/L (ref 98–107)
Creatinine: 0.6 mg/dL (ref 0.6–1.1)
Potassium: 4.2 mEq/L (ref 3.5–5.1)
Sodium: 139 mEq/L (ref 136–145)

## 2013-02-18 MED ORDER — SODIUM CHLORIDE 0.9 % IV SOLN
Freq: Once | INTRAVENOUS | Status: AC
  Administered 2013-02-18: 11:00:00 via INTRAVENOUS

## 2013-02-18 MED ORDER — DIPHENHYDRAMINE HCL 25 MG PO CAPS
50.0000 mg | ORAL_CAPSULE | Freq: Once | ORAL | Status: AC
  Administered 2013-02-18: 50 mg via ORAL

## 2013-02-18 MED ORDER — HEPARIN SOD (PORK) LOCK FLUSH 100 UNIT/ML IV SOLN
500.0000 [IU] | Freq: Once | INTRAVENOUS | Status: AC | PRN
  Administered 2013-02-18: 500 [IU]
  Filled 2013-02-18: qty 5

## 2013-02-18 MED ORDER — ACETAMINOPHEN 325 MG PO TABS
650.0000 mg | ORAL_TABLET | Freq: Once | ORAL | Status: AC
  Administered 2013-02-18: 650 mg via ORAL

## 2013-02-18 MED ORDER — SODIUM CHLORIDE 0.9 % IJ SOLN
10.0000 mL | INTRAMUSCULAR | Status: DC | PRN
  Administered 2013-02-18: 10 mL
  Filled 2013-02-18: qty 10

## 2013-02-18 MED ORDER — TRASTUZUMAB CHEMO INJECTION 440 MG
2.0000 mg/kg | Freq: Once | INTRAVENOUS | Status: AC
  Administered 2013-02-18: 147 mg via INTRAVENOUS
  Filled 2013-02-18: qty 7

## 2013-02-18 NOTE — Progress Notes (Signed)
OFFICE PROGRESS NOTE  CC  Jane Hy, MD [address] [place] [address] Dr. Emelia Loron Dr. Chipper Herb Dr. Starleen Arms Dr. Dorothy Puffer  DIAGNOSIS: 57 year old female who presented on 12/24/2012 which new diagnosis of breast cancer  PRIOR THERAPY:  #1 patient has multiple medical problems but recently she had a mammogram performed that showed a 5.4 cm area of calcifications. MRI of the breasts showed the area extended to about 7.2 cm. Needle core biopsy performed showed a high-grade DCIS that was ER positive PR positive.  #2 she was seen by Dr. Emelia Loron regarding surgical options. Patient wanted to have bilateral mastectomies. She went on to have this performed on 12/31/2012. The final pathology did reveal a 2.3 cm invasive ductal carcinoma in the left breast. The tumor was ER +100% PR +86% HER-2/neu was amplified with a ratio of 6.81. Ki-67 was elevated at 46%. She has had immediate reconstruction.  #3 patient is seen back in medical oncology for discussion of adjuvant treatment. Since patient is HER-2/neu positive recommendation is her 2-based therapy consisting of Taxotere carboplatinum and Herceptin. The Taxotere or and carboplatinum will be given every 3 weeks for 6 cycles. She will receive Neulasta on day 2. Herceptin will be given weekly with duration of chemotherapy and then every 3 weeks to finish out a one-year of treatment. Patient also understands that she will be placed on antiestrogen therapy with an aromatase inhibitor or tamoxifen.  CURRENT THERAPY: Today is day 15 cycle 1 of TCH  INTERVAL HISTORY:  Jane Gutierrez is doing well today.  She is day 15 of her first cycle of TCH.  Her mouth is improved and her diarrhea has resolved.  She had genetic testing at [facility] and is Chek 2+.  Her appetite is much improved and this week she's focusing on eating high protein, and greek yogurt to prepare for chemotherapy next week.  Otherwise, a 10 point ROS is neg.    MEDICAL HISTORY: Past Medical History  Diagnosis Date  . IBS (irritable bowel syndrome)   . ADD (attention deficit disorder)   . Lyme disease   . Contact lens/glasses fitting     wears contacts or glasses  . Breast cancer 12/04/12    left, ER/PR +  . Allergy   . Asthma   . Thyroid disease   . Hypothyroidism     ALLERGIES:  is allergic to betadine; iodine; milk-related compounds; penicillins; shellfish-derived products; sulfamethoxazole w-trimethoprim; and cleocin.  MEDICATIONS:  Current Outpatient Prescriptions  Medication Sig Dispense Refill  . albuterol (PROVENTIL HFA;VENTOLIN HFA) 108 (90 BASE) MCG/ACT inhaler Inhale 2 puffs into the lungs every 6 (six) hours as needed.      [name] dexamethasone (DECADRON) 4 MG tablet Take 2 tablets (8 mg total) by mouth 2 (two) times daily with a meal. Take two times a day the day before Taxotere. Then take two times a day starting the day after chemo for 3 days.  30 tablet  1  . dextroamphetamine (DEXTROSTAT) 5 MG tablet Take 5 mg by mouth 3 (three) times daily.      . eszopiclone (LUNESTA) 2 MG TABS Take 2 mg by mouth at bedtime. Take immediately before bedtime      . famotidine (PEPCID) 20 MG tablet Take 20 mg by mouth as needed.      [name] levothyroxine (SYNTHROID, LEVOTHROID) 100 MCG tablet Take 100 mcg by mouth daily.      [name] lidocaine-prilocaine (EMLA) cream Apply cream to PAC site 1-2 hours  prior to procedure.  30 Gutierrez  1  . LORazepam (ATIVAN) 0.5 MG tablet Take 1 tablet (0.5 mg total) by mouth every 6 (six) hours as needed (Nausea or vomiting).  30 tablet  0  . ondansetron (ZOFRAN) 8 MG tablet Take 1 tablet (8 mg total) by mouth 2 (two) times daily. Take two times a day starting the day after chemo for 3 days. Then take two times a day as needed for nausea or vomiting.  30 tablet  1  . prochlorperazine (COMPAZINE) 10 MG tablet Take 1 tablet (10 mg total) by mouth every 6 (six) hours as needed (Nausea or vomiting).  30 tablet  1  . prochlorperazine  (COMPAZINE) 25 MG suppository Place 1 suppository (25 mg total) rectally every 12 (twelve) hours as needed for nausea.  12 suppository  3  . promethazine (PHENERGAN) 25 MG tablet Take 25 mg by mouth every 6 (six) hours as needed.       No current facility-administered medications for this visit.    SURGICAL HISTORY:  Past Surgical History  Procedure Laterality Date  . Brain surgery      removal benign frontal lobe cyst in 1987  . Foot surgery  2013    bilateral  . Gynecologic cryosurgery      cervical cancer, 1998 last remained disease free with recent normal pap  . Diagnostic laparoscopy  2004    lysis adh-uterine ablation  . Breast surgery      bilateral mastopexy  . Breast surgery      removal benign left breast lesion  . Abdominal hysterectomy  2005    TAH/BSO-lysis adh, enometriosis  . Face lift    . Axillary sentinel node biopsy  12/31/2012    Procedure: AXILLARY SENTINEL NODE BIOPSY;  Surgeon: Emelia Loron, MD;  Location: [facility];  Service: General;  Laterality: Bilateral;  bilateral sentinel node  . Total mastectomy  12/31/2012    Procedure: TOTAL MASTECTOMY;  Surgeon: Emelia Loron, MD;  Location: [facility];  Service: General;  Laterality: Bilateral;  bilateral total mastectomies  . Breast reconstruction with placement of tissue expander and flex hd (acellular hydrated dermis)  12/31/2012    Procedure: BREAST RECONSTRUCTION WITH PLACEMENT OF TISSUE EXPANDER AND FLEX HD (ACELLULAR HYDRATED DERMIS);  Surgeon: Wayland Denis, DO;  Location: [facility];  Service: Plastics;  Laterality: Bilateral;  bilateral immediate breast reconstruction with expanders and flex hd   . Portacath placement  01/27/2013    Procedure: INSERTION PORT-A-CATH;  Surgeon: Emelia Loron, MD;  Location: [facility];  Service: General;  Laterality: Right;    REVIEW OF SYSTEMS:  General: fatigue (-), night sweats (-), fever (-), pain  (-) Lymph: palpable nodes (-) HEENT: vision changes (-), mucositis (-), gum bleeding (-), epistaxis (-) Cardiovascular: chest pain (-), palpitations (-) Pulmonary: shortness of breath (-), dyspnea on exertion (-), cough (-), hemoptysis (-) GI:  Early satiety (-), melena (-), dysphagia (-), nausea/vomiting (-), diarrhea (-) GU: dysuria (-), hematuria (-), incontinence (-) Musculoskeletal: joint swelling (-), joint pain (-), back pain (-) Neuro: weakness (-), numbness (-), headache (-), confusion (-) Skin: Rash (-), lesions (-), dryness (-) Psych: depression (-), suicidal/homicidal ideation (-), feeling of hopelessness (-)  PHYSICAL EXAMINATION: Blood pressure 124/78, pulse 85, temperature 97.8 F (36.6 C), temperature source Oral, resp. rate 20, height 5\' 5"  (1.651 m), weight 161 lb 12.8 oz (73.392 kg). Body mass index is 26.92 kg/(m^2). ECOG PERFORMANCE STATUS: 1 - Symptomatic  but completely ambulatory General: Patient is a well appearing female in no acute distress HEENT: PERRLA, sclerae anicteric no conjunctival pallor, MMM Neck: supple, no palpable adenopathy Lungs: clear to auscultation bilaterally, no wheezes, rhonchi, or rales Cardiovascular: regular rate rhythm, S1, S2, no murmurs, rubs or gallops Abdomen: Soft, non-tender, non-distended, normoactive bowel sounds, no HSM Extremities: warm and well perfused, no clubbing, cyanosis, or edema Skin: No rashes or lesions Neuro: Non-focal Breasts: She status post bilateral mastectomies with expander is in place. The cosmetic result is symmetrical. The incisions are healing very nicely. Both axillae are benign.  LABORATORY DATA: Lab Results  Component Value Date   WBC 10.6* 02/18/2013   HGB 13.0 02/18/2013   HCT 38.4 02/18/2013   MCV 82.6 02/18/2013   PLT 160 02/18/2013      Chemistry      Component Value Date/Time   NA 138 02/11/2013 1416   K 3.7 02/11/2013 1416   CL 102 02/11/2013 1416   CO2 27 02/11/2013 1416   BUN 12.9 02/11/2013  1416   CREATININE 0.6 02/11/2013 1416      Component Value Date/Time   CALCIUM 9.2 02/11/2013 1416   ALKPHOS 56 02/11/2013 1416   AST 44* 02/11/2013 1416   ALT 76* 02/11/2013 1416   BILITOT 0.20 02/11/2013 1416     12/31/2012 ADDITIONAL INFORMATION: 1. CHROMOGENIC IN-SITU HYBRIDIZATION Interpretation: HER2/NEU BY CISH - SHOWS AMPLIFICATION BY CISH ANALYSIS. THE RATIO OF HER2: CEP 17 SIGNALS WAS 6.81 Reference range: Ratio: HER2:CEP17 < 1.8 gene amplification not observed Ratio: HER2:CEP 17 1.8-2.2 - equivocal result Ratio: HER2:CEP17 > 2.2 - gene amplification observed Pecola Leisure MD Pathologist, Electronic Signature ( Signed 01/11/2013) 1. PROGNOSTIC INDICATORS - ACIS Results IMMUNOHISTOCHEMICAL AND MORPHOMETRIC ANALYSIS BY THE AUTOMATED CELLULAR IMAGING SYSTEM (ACIS) Estrogen Receptor (Negative, <1%): 99%, STRONG STAINING INTENSITY Progesterone Receptor (Negative, <1%): 10%, STRONG STAINING INTENSITY Proliferation Marker Ki67 by M IB-1 (Low<20%): 46% All controls stained appropriately Pecola Leisure MD Pathologist, Electronic Signature ( Signed 01/06/2013) 1 of 4 FINAL for HULL, Jane Gutierrez [phone]) FINAL DIAGNOSIS Diagnosis 1. Breast, simple mastectomy, Left - INVASIVE DUCTAL CARCINOMA, GRADE II (2.3 CM), SEE COMMENT. - LYMPHOVASCULAR INVASION IDENTIFIED - INVASIVE TUMOR IS 3 CM FROM THE NEAREST MARGIN (DEEP). - HIGH GRADE DUCTAL CARCINOMA IN SITU WITH COMEDO NECROSIS AND CALCIFICATIONS. - SEE TUMOR SYNOPTIC TEMPLATE BELOW. 2. Lymph node, sentinel, biopsy, Left axillary - ONE LYMPH NODE, NEGATIVE FOR TUMOR (0/1). 3. Breast, simple mastectomy, Right - BENIGN BREAST TISSUE, SEE COMMENT. - NEGATIVE FOR ATYPIA OR MALIGNANCY. 4. Lymph node, sentinel, biopsy, Right axillary - ONE LYMPH NODE, NEGATIVE FOR TUMOR (0/1). Microscopic Comment 1. BREAST, INVASIVE TUMOR, WITH LYMPH NODE SAMPLING Specimen, including laterality: Left breast Procedure: Simple mastectomy Grade: II of  III Tubule formation: 3 Nuclear pleomorphism: 2 Mitotic: 1 Tumor size (gross measurement) 2.3 cm Margins: Invasive, distance to closest margin: 3.0 cm In-situ, distance to closest margin: 3.0 cm (deep) If margin positive, focally or broadly: N/A Lymphovascular invasion: Present, extensive Ductal carcinoma in situ: Present Grade: III of III Extensive intraductal component: Absent Lobular neoplasia: Absent Tumor focality: Unifocal Treatment effect: None If present, treatment effect in breast tissue, lymph nodes or both: N/A Extent of tumor: Skin: Grossly negative for tumor Nipple: Grossly negative for tumor Skeletal muscle: Microscopically negative for tumor Lymph nodes: # examined: 1 Lymph nodes with metastasis: 0 Breast prognostic profile: Estrogen receptor: Repeated, previous study shows 100% positivity. [phone]) Progesterone receptor: Repeated, previous study demonstrated 86% positivity ([id number]) Her 2 neu: Pending  and will be reported in an addendum. Ki-67: Pending and will be reported in an addendum. Non-neoplastic breast: No significant findings. TNM: pT2, pN0, pMX Comments: None. (CR:kh 01-04-13) 3. The entire 2.0 cm previous biopsy hematoma is submitted for histopathologic review. On review, there is 2 of 4 FINAL for HULL, Jane Gutierrez [phone]) Microscopic Comment(continued) no in situ or invasive carcinoma identified in any of the slide sections examined. Additional non-neoplastic findings include benign fibrocystic change, pseudoangiomatous stromal hyperplasia, fibroadenomatoid nodules, periductal chronic inflammation with associated stromal fibrosis and microcalcifications in benign ducts and lobules. Pathologic Stage is pTX pN0, PMX. The surgical resection margin(s) of the specimen were inked and microscopically evaluated. [place] [facility] RADIOGRAPHIC STUDIES:  Mr Biopsy/wire Localization  12/25/2012  *RADIOLOGY REPORT*  Clinical Data:  6 mm enhancing mass in the  upper right breast.  The patient has recent diagnosis of left breast DCIS.  MRI GUIDED VACUUM ASSISTED BIOPSY OF THE RIGHT BREAST WITHOUT AND WITH CONTRAST  Comparison: Previous exams.  Technique: Multiplanar, multisequence MR images of the right breast were obtained prior to and following the intravenous administration of 15 ml of Mulithance.  I met with the patient, and we discussed the procedure of MRI guided biopsy, including risks, benefits, and alternatives. Specifically, we discussed the risks of infection, bleeding, tissue injury, clip migration, and inadequate sampling.  Informed, written consent was given.  Using sterile technique, 2% Lidocaine, MRI guidance, and a 9 gauge vacuum assisted device, biopsy was performed of a 6 mm enhancing mass using a lateral to medial approach.  At the conclusion of the procedure, a tissue marker clip was deployed into the biopsy cavity.  IMPRESSION: MRI guided biopsy of the right breast. No apparent complications.  THREE-DIMENSIONAL MR IMAGE RENDERING ON INDEPENDENT WORKSTATION:  Three-dimensional MR images were rendered by post-processing of the original MR data on an independent workstation.  The three- dimensional MR images were interpreted, and findings were reported in the accompanying complete MRI report for this study.   Original Report Authenticated By: Britta Mccreedy, M.D.    Nm Sentinel Node Inj-no Rpt (breast)  12/31/2012  CLINICAL DATA: right axillary sentinel node biopsy   Sulfur colloid was injected intradermally by the nuclear medicine  technologist for breast cancer sentinel node localization.     Nm Sentinel Node Inj-no Rpt (breast)  12/31/2012  CLINICAL DATA: left axillary sentinel node biopsy   Sulfur colloid was injected intradermally by the nuclear medicine  technologist for breast cancer sentinel node localization.     Mm Digital Diagnostic Unilat R  12/25/2012  *RADIOLOGY REPORT*  Clinical Data:  MRI guided core needle biopsy of a 6 mm nodule of  enhancement in the upper right breast was performed.  DIGITAL DIAGNOSTIC RIGHT MAMMOGRAM  Comparison:  Previous exams.  Findings:  Films are performed following MRI guided biopsy of 6 mm enhancing nodule in the upper central right breast.  A dumbbell shaped biopsy clip is present in the upper right breast just medial to the level of the nipple.  The clip is within a biopsy cavity that contains air and a small air-fluid level.  IMPRESSION: Satisfactory position of biopsy clip in the upper right breast.   Original Report Authenticated By: Britta Mccreedy, M.D.     ASSESSMENT: 57 year old [place] woman  (1) status post bilateral mastectomies in 12/31/2012 showing  (a) on the right, no evidence of malignancy  (b) on the left, a pT2 pN0, stage IIA invasive ductal carcinoma, grade 2, estrogen receptor 100% positive, progesterone receptor  86% positive, with an MIB-1 of 46%, and HER-2 amplification with a CISH ratio of 6.81  (2) starting adjuvant chemotherapy with carboplatin, docetaxel and trastuzumab every 13 2014, to consist of 6 cycles.  (3) trastuzumab to be continued to total one year; most recent echocardiogram 01/26/2013  PLAN:   1. Adoria is doing well today.  WE will proceed with Herceptin.   2. I will see her back next week for [facility].    All questions were answered. The patient knows to call the clinic with any problems, questions or concerns. We can certainly see the patient much sooner if necessary.   I spent 15 minutes counseling the patient face to face. The total time spent in the appointment was 30 minutes.    Cherie Ouch Lyn Hollingshead, NP Medical Oncology [facility] Phone: [phone]

## 2013-02-18 NOTE — Patient Instructions (Signed)
Doing well.  Proceed with Herceptin.  Take Tylenol, Benadryl and Claritin before your Neulasta and then Tylenol 6 hours later.

## 2013-02-18 NOTE — Patient Instructions (Signed)
[facility] Discharge Instructions for Patients Receiving Chemotherapy  Today you received the following chemotherapy agents :  Herceptin.  To help prevent nausea and vomiting after your treatment, we encourage you to take your nausea medication as instructed by your physician.    If you develop nausea and vomiting that is not controlled by your nausea medication, call the clinic. If it is after clinic hours your family physician or the after hours number for the clinic or go to the Emergency Department.   BELOW ARE SYMPTOMS THAT SHOULD BE REPORTED IMMEDIATELY:  *FEVER GREATER THAN 100.5 F  *CHILLS WITH OR WITHOUT FEVER  NAUSEA AND VOMITING THAT IS NOT CONTROLLED WITH YOUR NAUSEA MEDICATION  *UNUSUAL SHORTNESS OF BREATH  *UNUSUAL BRUISING OR BLEEDING  TENDERNESS IN MOUTH AND THROAT WITH OR WITHOUT PRESENCE OF ULCERS  *URINARY PROBLEMS  *BOWEL PROBLEMS  UNUSUAL RASH Items with * indicate a potential emergency and should be followed up as soon as possible.  One of the nurses will contact you 24 hours after your treatment. Please let the nurse know about any problems that you may have experienced. Feel free to call the clinic you have any questions or concerns. The clinic phone number is [phone].   I have been informed and understand all the instructions given to me. I know to contact the clinic, my physician, or go to the Emergency Department if any problems should occur. I do not have any questions at this time, but understand that I may call the clinic during office hours   should I have any questions or need assistance in obtaining follow up care.    __________________________________________  _____________  __________ Signature of Patient or Authorized Representative            Date                   Time    __________________________________________ Nurse's Signature

## 2013-02-19 ENCOUNTER — Other Ambulatory Visit: Payer: Self-pay | Admitting: Oncology

## 2013-02-22 ENCOUNTER — Telehealth: Payer: Self-pay | Admitting: Medical Oncology

## 2013-02-22 NOTE — Telephone Encounter (Signed)
Per MD, informed patient prescription for Ativan ready for patient to pick up. Patient states will p.u. When here for Thursday appts. No questions at this time.

## 2013-02-25 ENCOUNTER — Encounter: Payer: Self-pay | Admitting: Oncology

## 2013-02-25 ENCOUNTER — Other Ambulatory Visit (HOSPITAL_BASED_OUTPATIENT_CLINIC_OR_DEPARTMENT_OTHER): Payer: 59 | Admitting: Lab

## 2013-02-25 ENCOUNTER — Ambulatory Visit (HOSPITAL_BASED_OUTPATIENT_CLINIC_OR_DEPARTMENT_OTHER): Payer: 59 | Admitting: Adult Health

## 2013-02-25 ENCOUNTER — Encounter: Payer: Self-pay | Admitting: Adult Health

## 2013-02-25 ENCOUNTER — Ambulatory Visit (HOSPITAL_BASED_OUTPATIENT_CLINIC_OR_DEPARTMENT_OTHER): Payer: 59

## 2013-02-25 VITALS — BP 126/82 | HR 86 | Temp 97.6°F | Resp 20 | Ht 65.0 in | Wt 161.6 lb

## 2013-02-25 DIAGNOSIS — C50412 Malignant neoplasm of upper-outer quadrant of left female breast: Secondary | ICD-10-CM

## 2013-02-25 DIAGNOSIS — Z5112 Encounter for antineoplastic immunotherapy: Secondary | ICD-10-CM

## 2013-02-25 DIAGNOSIS — C50419 Malignant neoplasm of upper-outer quadrant of unspecified female breast: Secondary | ICD-10-CM

## 2013-02-25 DIAGNOSIS — Z5111 Encounter for antineoplastic chemotherapy: Secondary | ICD-10-CM

## 2013-02-25 LAB — COMPREHENSIVE METABOLIC PANEL (CC13)
AST: 20 U/L (ref 5–34)
Albumin: 3.2 g/dL — ABNORMAL LOW (ref 3.5–5.0)
BUN: 24.5 mg/dL (ref 7.0–26.0)
CO2: 27 mEq/L (ref 22–29)
Calcium: 8.9 mg/dL (ref 8.4–10.4)
Chloride: 103 mEq/L (ref 98–107)
Creatinine: 0.6 mg/dL (ref 0.6–1.1)
Glucose: 121 mg/dl — ABNORMAL HIGH (ref 70–99)
Potassium: 3.6 mEq/L (ref 3.5–5.1)

## 2013-02-25 LAB — CBC WITH DIFFERENTIAL/PLATELET
Basophils Absolute: 0 10*3/uL (ref 0.0–0.1)
Eosinophils Absolute: 0 10*3/uL (ref 0.0–0.5)
HCT: 34.5 % — ABNORMAL LOW (ref 34.8–46.6)
HGB: 11.7 g/dL (ref 11.6–15.9)
MCH: 27.9 pg (ref 25.1–34.0)
MONO#: 1.3 10*3/uL — ABNORMAL HIGH (ref 0.1–0.9)
NEUT%: 51.6 % (ref 38.4–76.8)
WBC: 9.3 10*3/uL (ref 3.9–10.3)
lymph#: 3.2 10*3/uL (ref 0.9–3.3)

## 2013-02-25 MED ORDER — SODIUM CHLORIDE 0.9 % IV SOLN
774.0000 mg | Freq: Once | INTRAVENOUS | Status: AC
  Administered 2013-02-25: 770 mg via INTRAVENOUS
  Filled 2013-02-25: qty 77

## 2013-02-25 MED ORDER — DEXAMETHASONE SODIUM PHOSPHATE 4 MG/ML IJ SOLN
20.0000 mg | Freq: Once | INTRAMUSCULAR | Status: AC
  Administered 2013-02-25: 20 mg via INTRAVENOUS

## 2013-02-25 MED ORDER — SODIUM CHLORIDE 0.9 % IJ SOLN
10.0000 mL | INTRAMUSCULAR | Status: DC | PRN
  Administered 2013-02-25: 10 mL
  Filled 2013-02-25: qty 10

## 2013-02-25 MED ORDER — HEPARIN SOD (PORK) LOCK FLUSH 100 UNIT/ML IV SOLN
500.0000 [IU] | Freq: Once | INTRAVENOUS | Status: AC | PRN
  Administered 2013-02-25: 500 [IU]
  Filled 2013-02-25: qty 5

## 2013-02-25 MED ORDER — ACETAMINOPHEN 325 MG PO TABS
650.0000 mg | ORAL_TABLET | Freq: Once | ORAL | Status: AC
  Administered 2013-02-25: 650 mg via ORAL

## 2013-02-25 MED ORDER — DOCETAXEL CHEMO INJECTION 160 MG/16ML
75.0000 mg/m2 | Freq: Once | INTRAVENOUS | Status: AC
  Administered 2013-02-25: 140 mg via INTRAVENOUS
  Filled 2013-02-25: qty 14

## 2013-02-25 MED ORDER — DIPHENHYDRAMINE HCL 25 MG PO CAPS
25.0000 mg | ORAL_CAPSULE | Freq: Once | ORAL | Status: AC
  Administered 2013-02-25: 25 mg via ORAL

## 2013-02-25 MED ORDER — TRASTUZUMAB CHEMO INJECTION 440 MG
2.0000 mg/kg | Freq: Once | INTRAVENOUS | Status: AC
  Administered 2013-02-25: 147 mg via INTRAVENOUS
  Filled 2013-02-25: qty 7

## 2013-02-25 MED ORDER — SODIUM CHLORIDE 0.9 % IV SOLN
Freq: Once | INTRAVENOUS | Status: AC
  Administered 2013-02-25: 10:00:00 via INTRAVENOUS

## 2013-02-25 MED ORDER — ONDANSETRON 16 MG/50ML IVPB (CHCC)
16.0000 mg | Freq: Once | INTRAVENOUS | Status: AC
  Administered 2013-02-25: 16 mg via INTRAVENOUS

## 2013-02-25 NOTE — Patient Instructions (Addendum)
Doing well.  Proceed with chemotherapy.  Please call us if you have any questions or concerns.    

## 2013-02-25 NOTE — Progress Notes (Signed)
OFFICE PROGRESS NOTE  CC  [name], MD [address] [place] [address] Dr. Emelia Loron Dr. Chipper Herb Dr. Starleen Arms Dr. Dorothy Puffer  DIAGNOSIS: 57 year old female who presented on 12/24/2012 which new diagnosis of breast cancer  PRIOR THERAPY:  #1 patient has multiple medical problems but recently she had a mammogram performed that showed a 5.4 cm area of calcifications. MRI of the breasts showed the area extended to about 7.2 cm. Needle core biopsy performed showed a high-grade DCIS that was ER positive PR positive.  #2 she was seen by Dr. Emelia Loron regarding surgical options. Patient wanted to have bilateral mastectomies. She went on to have this performed on 12/31/2012. The final pathology did reveal a 2.3 cm invasive ductal carcinoma in the left breast. The tumor was ER +100% PR +86% HER-2/neu was amplified with a ratio of 6.81. Ki-67 was elevated at 46%. She has had immediate reconstruction.  #3 patient is seen back in medical oncology for discussion of adjuvant treatment. Since patient is HER-2/neu positive recommendation is her 2-based therapy consisting of Taxotere carboplatinum and Herceptin. The Taxotere or and carboplatinum will be given every 3 weeks for 6 cycles. She will receive Neulasta on day 2. Herceptin will be given weekly with duration of chemotherapy and then every 3 weeks to finish out a one-year of treatment. Patient also understands that she will be placed on antiestrogen therapy with an aromatase inhibitor or tamoxifen.  CURRENT THERAPY: Today is day 1 cycle 2 of TCH  INTERVAL HISTORY:  [place] is doing well today.  She is ready to receive cycle two of her treatment.  She would like to receive one benadryl instead of two prior to the Herceptin as two made her incredibly sleepy and groggy.  Otherwise, she denies fevers, chills, numbness, constipation, diarrhea, or any other concerns.  A 10 point ROS is neg.    MEDICAL HISTORY: Past  Medical History  Diagnosis Date  . IBS (irritable bowel syndrome)   . ADD (attention deficit disorder)   . Lyme disease   . Contact lens/glasses fitting     wears contacts or glasses  . Breast cancer 12/04/12    left, ER/PR +  . Allergy   . Asthma   . Thyroid disease   . Hypothyroidism     ALLERGIES:  is allergic to betadine; iodine; milk-related compounds; penicillins; shellfish-derived products; sulfamethoxazole w-trimethoprim; and cleocin.  MEDICATIONS:  Current Outpatient Prescriptions  Medication Sig Dispense Refill  . albuterol (PROVENTIL HFA;VENTOLIN HFA) 108 (90 BASE) MCG/ACT inhaler Inhale 2 puffs into the lungs every 6 (six) hours as needed.      [name] dexamethasone (DECADRON) 4 MG tablet Take 2 tablets (8 mg total) by mouth 2 (two) times daily with a meal. Take two times a day the day before Taxotere. Then take two times a day starting the day after chemo for 3 days.  30 tablet  1  . dextroamphetamine (DEXTROSTAT) 5 MG tablet Take 5 mg by mouth 3 (three) times daily.      . eszopiclone (LUNESTA) 2 MG TABS Take 2 mg by mouth at bedtime. Take immediately before bedtime      . famotidine (PEPCID) 20 MG tablet Take 20 mg by mouth as needed.      [name] levothyroxine (SYNTHROID, LEVOTHROID) 100 MCG tablet Take 100 mcg by mouth daily.      [name] lidocaine-prilocaine (EMLA) cream Apply cream to PAC site 1-2 hours prior to procedure.  30 g  1  .  LORazepam (ATIVAN) 0.5 MG tablet TAKE 1 TABLET BY MOUTH EVERY 6 HOURS AS NEEDED FOR NAUSEA OR VOMITING  30 tablet  0  . ondansetron (ZOFRAN) 8 MG tablet Take 1 tablet (8 mg total) by mouth 2 (two) times daily. Take two times a day starting the day after chemo for 3 days. Then take two times a day as needed for nausea or vomiting.  30 tablet  1  . prochlorperazine (COMPAZINE) 10 MG tablet Take 1 tablet (10 mg total) by mouth every 6 (six) hours as needed (Nausea or vomiting).  30 tablet  1  . prochlorperazine (COMPAZINE) 25 MG suppository Place 1  suppository (25 mg total) rectally every 12 (twelve) hours as needed for nausea.  12 suppository  3  . promethazine (PHENERGAN) 25 MG tablet Take 25 mg by mouth every 6 (six) hours as needed.      . sennosides-docusate sodium (SENOKOT-S) 8.6-50 MG tablet Take 1 tablet by mouth daily.       No current facility-administered medications for this visit.    SURGICAL HISTORY:  Past Surgical History  Procedure Laterality Date  . Brain surgery      removal benign frontal lobe cyst in 1987  . Foot surgery  2013    bilateral  . Gynecologic cryosurgery      cervical cancer, 1998 last remained disease free with recent normal pap  . Diagnostic laparoscopy  2004    lysis adh-uterine ablation  . Breast surgery      bilateral mastopexy  . Breast surgery      removal benign left breast lesion  . Abdominal hysterectomy  2005    TAH/BSO-lysis adh, enometriosis  . Face lift    . Axillary sentinel node biopsy  12/31/2012    Procedure: AXILLARY SENTINEL NODE BIOPSY;  Surgeon: Emelia Loron, MD;  Location: [facility];  Service: General;  Laterality: Bilateral;  bilateral sentinel node  . Total mastectomy  12/31/2012    Procedure: TOTAL MASTECTOMY;  Surgeon: Emelia Loron, MD;  Location: [facility];  Service: General;  Laterality: Bilateral;  bilateral total mastectomies  . Breast reconstruction with placement of tissue expander and flex hd (acellular hydrated dermis)  12/31/2012    Procedure: BREAST RECONSTRUCTION WITH PLACEMENT OF TISSUE EXPANDER AND FLEX HD (ACELLULAR HYDRATED DERMIS);  Surgeon: Wayland Denis, DO;  Location: [facility];  Service: Plastics;  Laterality: Bilateral;  bilateral immediate breast reconstruction with expanders and flex hd   . Portacath placement  01/27/2013    Procedure: INSERTION PORT-A-CATH;  Surgeon: Emelia Loron, MD;  Location: [facility];  Service: General;  Laterality: Right;    REVIEW OF SYSTEMS:   General: fatigue (-), night sweats (-), fever (-), pain (-) Lymph: palpable nodes (-) HEENT: vision changes (-), mucositis (-), gum bleeding (-), epistaxis (-) Cardiovascular: chest pain (-), palpitations (-) Pulmonary: shortness of breath (-), dyspnea on exertion (-), cough (-), hemoptysis (-) GI:  Early satiety (-), melena (-), dysphagia (-), nausea/vomiting (-), diarrhea (-) GU: dysuria (-), hematuria (-), incontinence (-) Musculoskeletal: joint swelling (-), joint pain (-), back pain (-) Neuro: weakness (-), numbness (-), headache (-), confusion (-) Skin: Rash (-), lesions (-), dryness (-) Psych: depression (-), suicidal/homicidal ideation (-), feeling of hopelessness (-)  PHYSICAL EXAMINATION: Blood pressure 126/82, pulse 86, temperature 97.6 F (36.4 C), temperature source Oral, resp. rate 20, height 5\' 5"  (1.651 m), weight 161 lb 9.6 oz (73.301 kg). Body mass index is 26.89 kg/(m^2). ECOG  PERFORMANCE STATUS: 1 - Symptomatic but completely ambulatory General: Patient is a well appearing female in no acute distress HEENT: PERRLA, sclerae anicteric no conjunctival pallor, MMM Neck: supple, no palpable adenopathy Lungs: clear to auscultation bilaterally, no wheezes, rhonchi, or rales Cardiovascular: regular rate rhythm, S1, S2, no murmurs, rubs or gallops Abdomen: Soft, non-tender, non-distended, normoactive bowel sounds, no HSM Extremities: warm and well perfused, no clubbing, cyanosis, or edema Skin: No rashes or lesions Neuro: Non-focal Breasts: She status post bilateral mastectomies with expander is in place. The cosmetic result is symmetrical. The incisions are healing very nicely. Both axillae are benign.  LABORATORY DATA: Lab Results  Component Value Date   WBC 9.3 02/25/2013   HGB 11.7 02/25/2013   HCT 34.5* 02/25/2013   MCV 82.1 02/25/2013   PLT 212 02/25/2013      Chemistry      Component Value Date/Time   NA 139 02/18/2013 0844   K 4.2 02/18/2013 0844   CL 106 02/18/2013  0844   CO2 26 02/18/2013 0844   BUN 22.7 02/18/2013 0844   CREATININE 0.6 02/18/2013 0844      Component Value Date/Time   CALCIUM 8.7 02/18/2013 0844   ALKPHOS 55 02/18/2013 0844   AST 31 02/18/2013 0844   ALT 50 02/18/2013 0844   BILITOT 0.25 02/18/2013 0844     12/31/2012 ADDITIONAL INFORMATION: 1. CHROMOGENIC IN-SITU HYBRIDIZATION Interpretation: HER2/NEU BY CISH - SHOWS AMPLIFICATION BY CISH ANALYSIS. THE RATIO OF HER2: CEP 17 SIGNALS WAS 6.81 Reference range: Ratio: HER2:CEP17 < 1.8 gene amplification not observed Ratio: HER2:CEP 17 1.8-2.2 - equivocal result Ratio: HER2:CEP17 > 2.2 - gene amplification observed Pecola Leisure MD Pathologist, Electronic Signature ( Signed 01/11/2013) 1. PROGNOSTIC INDICATORS - ACIS Results IMMUNOHISTOCHEMICAL AND MORPHOMETRIC ANALYSIS BY THE AUTOMATED CELLULAR IMAGING SYSTEM (ACIS) Estrogen Receptor (Negative, <1%): 99%, STRONG STAINING INTENSITY Progesterone Receptor (Negative, <1%): 10%, STRONG STAINING INTENSITY Proliferation Marker Ki67 by M IB-1 (Low<20%): 46% All controls stained appropriately Pecola Leisure MD Pathologist, Electronic Signature ( Signed 01/06/2013) 1 of 4 FINAL for HULL, Elfie G [phone]) FINAL DIAGNOSIS Diagnosis 1. Breast, simple mastectomy, Left - INVASIVE DUCTAL CARCINOMA, GRADE II (2.3 CM), SEE COMMENT. - LYMPHOVASCULAR INVASION IDENTIFIED - INVASIVE TUMOR IS 3 CM FROM THE NEAREST MARGIN (DEEP). - HIGH GRADE DUCTAL CARCINOMA IN SITU WITH COMEDO NECROSIS AND CALCIFICATIONS. - SEE TUMOR SYNOPTIC TEMPLATE BELOW. 2. Lymph node, sentinel, biopsy, Left axillary - ONE LYMPH NODE, NEGATIVE FOR TUMOR (0/1). 3. Breast, simple mastectomy, Right - BENIGN BREAST TISSUE, SEE COMMENT. - NEGATIVE FOR ATYPIA OR MALIGNANCY. 4. Lymph node, sentinel, biopsy, Right axillary - ONE LYMPH NODE, NEGATIVE FOR TUMOR (0/1). Microscopic Comment 1. BREAST, INVASIVE TUMOR, WITH LYMPH NODE SAMPLING Specimen, including laterality: Left  breast Procedure: Simple mastectomy Grade: II of III Tubule formation: 3 Nuclear pleomorphism: 2 Mitotic: 1 Tumor size (gross measurement) 2.3 cm Margins: Invasive, distance to closest margin: 3.0 cm In-situ, distance to closest margin: 3.0 cm (deep) If margin positive, focally or broadly: N/A Lymphovascular invasion: Present, extensive Ductal carcinoma in situ: Present Grade: III of III Extensive intraductal component: Absent Lobular neoplasia: Absent Tumor focality: Unifocal Treatment effect: None If present, treatment effect in breast tissue, lymph nodes or both: N/A Extent of tumor: Skin: Grossly negative for tumor Nipple: Grossly negative for tumor Skeletal muscle: Microscopically negative for tumor Lymph nodes: # examined: 1 Lymph nodes with metastasis: 0 Breast prognostic profile: Estrogen receptor: Repeated, previous study shows 100% positivity. [phone]) Progesterone receptor: Repeated, previous study demonstrated 86% positivity (  [phone]) Her 2 neu: Pending and will be reported in an addendum. Ki-67: Pending and will be reported in an addendum. Non-neoplastic breast: No significant findings. TNM: pT2, pN0, pMX Comments: None. (CR:kh 01-04-13) 3. The entire 2.0 cm previous biopsy hematoma is submitted for histopathologic review. On review, there is 2 of 4 FINAL for HULL, Kimball G [phone]) Microscopic Comment(continued) no in situ or invasive carcinoma identified in any of the slide sections examined. Additional non-neoplastic findings include benign fibrocystic change, pseudoangiomatous stromal hyperplasia, fibroadenomatoid nodules, periductal chronic inflammation with associated stromal fibrosis and microcalcifications in benign ducts and lobules. Pathologic Stage is pTX pN0, PMX. The surgical resection margin(s) of the specimen were inked and microscopically evaluated. [place] RU RADIOGRAPHIC STUDIES:  Mr Biopsy/wire Localization  12/25/2012  *RADIOLOGY  REPORT*  Clinical Data:  6 mm enhancing mass in the upper right breast.  The patient has recent diagnosis of left breast DCIS.  MRI GUIDED VACUUM ASSISTED BIOPSY OF THE RIGHT BREAST WITHOUT AND WITH CONTRAST  Comparison: Previous exams.  Technique: Multiplanar, multisequence MR images of the right breast were obtained prior to and following the intravenous administration of 15 ml of Mulithance.  I met with the patient, and we discussed the procedure of MRI guided biopsy, including risks, benefits, and alternatives. Specifically, we discussed the risks of infection, bleeding, tissue injury, clip migration, and inadequate sampling.  Informed, written consent was given.  Using sterile technique, 2% Lidocaine, MRI guidance, and a 9 gauge vacuum assisted device, biopsy was performed of a 6 mm enhancing mass using a lateral to medial approach.  At the conclusion of the procedure, a tissue marker clip was deployed into the biopsy cavity.  IMPRESSION: MRI guided biopsy of the right breast. No apparent complications.  THREE-DIMENSIONAL MR IMAGE RENDERING ON INDEPENDENT WORKSTATION:  Three-dimensional MR images were rendered by post-processing of the original MR data on an independent workstation.  The three- dimensional MR images were interpreted, and findings were reported in the accompanying complete MRI report for this study.   Original Report Authenticated By: Britta Mccreedy, M.D.    Nm Sentinel Node Inj-no Rpt (breast)  12/31/2012  CLINICAL DATA: right axillary sentinel node biopsy   Sulfur colloid was injected intradermally by the nuclear medicine  technologist for breast cancer sentinel node localization.     Nm Sentinel Node Inj-no Rpt (breast)  12/31/2012  CLINICAL DATA: left axillary sentinel node biopsy   Sulfur colloid was injected intradermally by the nuclear medicine  technologist for breast cancer sentinel node localization.     Mm Digital Diagnostic Unilat R  12/25/2012  *RADIOLOGY REPORT*  Clinical Data:   MRI guided core needle biopsy of a 6 mm nodule of enhancement in the upper right breast was performed.  DIGITAL DIAGNOSTIC RIGHT MAMMOGRAM  Comparison:  Previous exams.  Findings:  Films are performed following MRI guided biopsy of 6 mm enhancing nodule in the upper central right breast.  A dumbbell shaped biopsy clip is present in the upper right breast just medial to the level of the nipple.  The clip is within a biopsy cavity that contains air and a small air-fluid level.  IMPRESSION: Satisfactory position of biopsy clip in the upper right breast.   Original Report Authenticated By: Britta Mccreedy, M.D.     ASSESSMENT: 56 year old [place] woman  (1) status post bilateral mastectomies in 12/31/2012 showing  (a) on the right, no evidence of malignancy  (b) on the left, a pT2 pN0, stage IIA invasive ductal carcinoma, grade 2, estrogen  receptor 100% positive, progesterone receptor 86% positive, with an MIB-1 of 46%, and HER-2 amplification with a CISH ratio of 6.81  (2) starting adjuvant chemotherapy with carboplatin, docetaxel and trastuzumab every 13 2014, to consist of 6 cycles.  (3) trastuzumab to be continued to total one year; most recent echocardiogram 01/26/2013  PLAN:   1. Ronie is doing well today.  She will proceed with chemotherapy today.  I decreased her Benadryl dose to 25mg .  I received her CHEk-2 + results and will send them to medical records to be scanned in.    2. I will see her back next week for weekly Herceptin.   All questions were answered. The patient knows to call the clinic with any problems, questions or concerns. We can certainly see the patient much sooner if necessary.   I spent 25 minutes counseling the patient face to face. The total time spent in the appointment was 30 minutes.    Cherie Ouch Lyn Hollingshead, NP Medical Oncology [facility] Phone: [phone]

## 2013-02-25 NOTE — Patient Instructions (Addendum)
[facility] Discharge Instructions for Patients Receiving Chemotherapy  Today you received the following chemotherapy agents herceptin taxotere, carboplatin  To help prevent nausea and vomiting after your treatment, we encourage you to take your nausea medication as needed. Begin taking it at 4 pm and take it as often as prescribed as needed.   If you develop nausea and vomiting that is not controlled by your nausea medication, call the clinic. If it is after clinic hours your family physician or the after hours number for the clinic or go to the Emergency Department.   BELOW ARE SYMPTOMS THAT SHOULD BE REPORTED IMMEDIATELY:  *FEVER GREATER THAN 100.5 F  *CHILLS WITH OR WITHOUT FEVER  NAUSEA AND VOMITING THAT IS NOT CONTROLLED WITH YOUR NAUSEA MEDICATION  *UNUSUAL SHORTNESS OF BREATH  *UNUSUAL BRUISING OR BLEEDING  TENDERNESS IN MOUTH AND THROAT WITH OR WITHOUT PRESENCE OF ULCERS  *URINARY PROBLEMS  *BOWEL PROBLEMS  UNUSUAL RASH Items with * indicate a potential emergency and should be followed up as soon as possible.   Feel free to call the clinic you have any questions or concerns. The clinic phone number is [phone].   I have been informed and understand all the instructions given to me. I know to contact the clinic, my physician, or go to the Emergency Department if any problems should occur. I do not have any questions at this time, but understand that I may call the clinic during office hours   should I have any questions or need assistance in obtaining follow up care.    __________________________________________  _____________  __________ Signature of Patient or Authorized Representative            Date                   Time    __________________________________________ Nurse's Signature

## 2013-02-26 ENCOUNTER — Ambulatory Visit (HOSPITAL_BASED_OUTPATIENT_CLINIC_OR_DEPARTMENT_OTHER): Payer: 59

## 2013-02-26 VITALS — BP 131/79 | HR 77 | Temp 97.7°F

## 2013-02-26 DIAGNOSIS — C50419 Malignant neoplasm of upper-outer quadrant of unspecified female breast: Secondary | ICD-10-CM

## 2013-02-26 DIAGNOSIS — Z5189 Encounter for other specified aftercare: Secondary | ICD-10-CM

## 2013-02-26 MED ORDER — PEGFILGRASTIM INJECTION 6 MG/0.6ML
6.0000 mg | Freq: Once | SUBCUTANEOUS | Status: AC
  Administered 2013-02-26: 6 mg via SUBCUTANEOUS
  Filled 2013-02-26: qty 0.6

## 2013-03-04 ENCOUNTER — Ambulatory Visit (HOSPITAL_BASED_OUTPATIENT_CLINIC_OR_DEPARTMENT_OTHER): Payer: 59 | Admitting: Adult Health

## 2013-03-04 ENCOUNTER — Ambulatory Visit (HOSPITAL_BASED_OUTPATIENT_CLINIC_OR_DEPARTMENT_OTHER): Payer: 59

## 2013-03-04 ENCOUNTER — Other Ambulatory Visit (HOSPITAL_BASED_OUTPATIENT_CLINIC_OR_DEPARTMENT_OTHER): Payer: 59 | Admitting: Lab

## 2013-03-04 ENCOUNTER — Encounter: Payer: Self-pay | Admitting: Adult Health

## 2013-03-04 VITALS — BP 134/79 | HR 97 | Temp 98.4°F | Resp 20 | Ht 65.0 in | Wt 155.7 lb

## 2013-03-04 DIAGNOSIS — Z5112 Encounter for antineoplastic immunotherapy: Secondary | ICD-10-CM

## 2013-03-04 DIAGNOSIS — C50419 Malignant neoplasm of upper-outer quadrant of unspecified female breast: Secondary | ICD-10-CM

## 2013-03-04 DIAGNOSIS — D702 Other drug-induced agranulocytosis: Secondary | ICD-10-CM

## 2013-03-04 DIAGNOSIS — D709 Neutropenia, unspecified: Secondary | ICD-10-CM

## 2013-03-04 DIAGNOSIS — C50412 Malignant neoplasm of upper-outer quadrant of left female breast: Secondary | ICD-10-CM

## 2013-03-04 DIAGNOSIS — E860 Dehydration: Secondary | ICD-10-CM

## 2013-03-04 LAB — CBC WITH DIFFERENTIAL/PLATELET
Basophils Absolute: 0 10*3/uL (ref 0.0–0.1)
EOS%: 1.2 % (ref 0.0–7.0)
Eosinophils Absolute: 0.1 10*3/uL (ref 0.0–0.5)
HCT: 35.5 % (ref 34.8–46.6)
HGB: 12.1 g/dL (ref 11.6–15.9)
LYMPH%: 61.8 % — ABNORMAL HIGH (ref 14.0–49.7)
MCH: 28.1 pg (ref 25.1–34.0)
MCV: 82.4 fL (ref 79.5–101.0)
MONO%: 18.6 % — ABNORMAL HIGH (ref 0.0–14.0)
NEUT#: 0.8 10*3/uL — ABNORMAL LOW (ref 1.5–6.5)
NEUT%: 17.7 % — ABNORMAL LOW (ref 38.4–76.8)
Platelets: 110 10*3/uL — ABNORMAL LOW (ref 145–400)

## 2013-03-04 LAB — COMPREHENSIVE METABOLIC PANEL (CC13)
BUN: 14.8 mg/dL (ref 7.0–26.0)
CO2: 27 mEq/L (ref 22–29)
Creatinine: 0.6 mg/dL (ref 0.6–1.1)
Glucose: 95 mg/dl (ref 70–99)
Sodium: 137 mEq/L (ref 136–145)
Total Bilirubin: 0.45 mg/dL (ref 0.20–1.20)
Total Protein: 6.5 g/dL (ref 6.4–8.3)

## 2013-03-04 MED ORDER — SODIUM CHLORIDE 0.9 % IJ SOLN
10.0000 mL | INTRAMUSCULAR | Status: DC | PRN
  Administered 2013-03-04: 10 mL
  Filled 2013-03-04: qty 10

## 2013-03-04 MED ORDER — ACETAMINOPHEN 325 MG PO TABS
650.0000 mg | ORAL_TABLET | Freq: Once | ORAL | Status: AC
  Administered 2013-03-04: 650 mg via ORAL

## 2013-03-04 MED ORDER — SODIUM CHLORIDE 0.9 % IV SOLN
Freq: Once | INTRAVENOUS | Status: DC
  Administered 2013-03-04: 16:00:00 via INTRAVENOUS

## 2013-03-04 MED ORDER — DIPHENHYDRAMINE HCL 25 MG PO CAPS
50.0000 mg | ORAL_CAPSULE | Freq: Once | ORAL | Status: AC
  Administered 2013-03-04: 25 mg via ORAL

## 2013-03-04 MED ORDER — TRASTUZUMAB CHEMO INJECTION 440 MG
2.0000 mg/kg | Freq: Once | INTRAVENOUS | Status: AC
  Administered 2013-03-04: 147 mg via INTRAVENOUS
  Filled 2013-03-04: qty 7

## 2013-03-04 MED ORDER — CIPROFLOXACIN HCL 500 MG PO TABS: 500.0000 mg | ORAL_TABLET | Freq: Two times a day (BID) | ORAL | Status: DC

## 2013-03-04 MED ORDER — HEPARIN SOD (PORK) LOCK FLUSH 100 UNIT/ML IV SOLN
500.0000 [IU] | Freq: Once | INTRAVENOUS | Status: AC | PRN
  Administered 2013-03-04: 500 [IU]
  Filled 2013-03-04: qty 5

## 2013-03-04 MED ORDER — SODIUM CHLORIDE 0.9 % IV SOLN
Freq: Once | INTRAVENOUS | Status: AC
  Administered 2013-03-04: 15:00:00 via INTRAVENOUS

## 2013-03-04 NOTE — Patient Instructions (Addendum)
Doing well, proceed with Herceptin.  We will give you IV fluids today.     Patient Neutropenia Instruction Sheet  Diagnosis: Breast Cancer      Treating Physician: Drue Second, MD  Treatment: 1. Type of chemotherapy: TCH 2. Date of last treatment: 02/25/13  Last Blood Counts: Lab Results  Component Value Date   WBC 4.2 03/04/2013   HGB 12.1 03/04/2013   HCT 35.5 03/04/2013   MCV 82.4 03/04/2013   PLT 110* 03/04/2013   ANC 800     Prophylactic Antibiotics: Cipro 500 mg by mouth twice a day Instructions: 1. Monitor temperature and call if fever  greater than 100.5, chills, shaking chills (rigors) 2. Call Physician on-call at [phone] 3. Give him/her symptoms and list of medications that you are taking and your last blood count.

## 2013-03-04 NOTE — Patient Instructions (Addendum)
[facility] Discharge Instructions for Patients Receiving Chemotherapy  Today you received the following chemotherapy agent Herceptin.  To help prevent nausea and vomiting after your treatment, we encourage you to take your nausea medication. Begin taking your nausea medication as often as prescribed for by Dr. Welton Flakes.    If you develop nausea and vomiting that is not controlled by your nausea medication, call the clinic. If it is after clinic hours your family physician or the after hours number for the clinic or go to the Emergency Department.   BELOW ARE SYMPTOMS THAT SHOULD BE REPORTED IMMEDIATELY:  *FEVER GREATER THAN 100.5 F  *CHILLS WITH OR WITHOUT FEVER  NAUSEA AND VOMITING THAT IS NOT CONTROLLED WITH YOUR NAUSEA MEDICATION  *UNUSUAL SHORTNESS OF BREATH  *UNUSUAL BRUISING OR BLEEDING  TENDERNESS IN MOUTH AND THROAT WITH OR WITHOUT PRESENCE OF ULCERS  *URINARY PROBLEMS  *BOWEL PROBLEMS  UNUSUAL RASH Items with * indicate a potential emergency and should be followed up as soon as possible.  One of the nurses will contact you 24 hours after your treatment. Please let the nurse know about any problems that you may have experienced. Feel free to call the clinic you have any questions or concerns. The clinic phone number is [phone].   I have been informed and understand all the instructions given to me. I know to contact the clinic, my physician, or go to the Emergency Department if any problems should occur. I do not have any questions at this time, but understand that I may call the clinic during office hours   should I have any questions or need assistance in obtaining follow up care.    __________________________________________  _____________  __________ Signature of Patient or Authorized Representative            Date                   Time    __________________________________________ Nurse's Signature     Dehydration, Adult Dehydration  is when you lose more fluids from the body than you take in. Vital organs like the kidneys, brain, and heart cannot function without a proper amount of fluids and salt. Any loss of fluids from the body can cause dehydration.  CAUSES   Vomiting.  Diarrhea.  Excessive sweating.  Excessive urine output.  Fever. SYMPTOMS  Mild dehydration  Thirst.  Dry lips.  Slightly dry mouth. Moderate dehydration  Very dry mouth.  Sunken eyes.  Skin does not bounce back quickly when lightly pinched and released.  Dark urine and decreased urine production.  Decreased tear production.  Headache. Severe dehydration  Very dry mouth.  Extreme thirst.  Rapid, weak pulse (more than 100 beats per minute at rest).  Cold hands and feet.  Not able to sweat in spite of heat and temperature.  Rapid breathing.  Blue lips.  Confusion and lethargy.  Difficulty being awakened.  Minimal urine production.  No tears. DIAGNOSIS  Your caregiver will diagnose dehydration based on your symptoms and your exam. Blood and urine tests will help confirm the diagnosis. The diagnostic evaluation should also identify the cause of dehydration. TREATMENT  Treatment of mild or moderate dehydration can often be done at home by increasing the amount of fluids that you drink. It is best to drink small amounts of fluid more often. Drinking too much at one time can make vomiting worse. Refer to the home care instructions below. Severe dehydration needs to be treated at the  hospital where you will probably be given intravenous (IV) fluids that contain water and electrolytes. HOME CARE INSTRUCTIONS   Ask your caregiver about specific rehydration instructions.  Drink enough fluids to keep your urine clear or pale yellow.  Drink small amounts frequently if you have nausea and vomiting.  Eat as you normally do.  Avoid:  Foods or drinks high in sugar.  Carbonated drinks.  Juice.  Extremely hot or cold  fluids.  Drinks with caffeine.  Fatty, greasy foods.  Alcohol.  Tobacco.  Overeating.  Gelatin desserts.  Wash your hands well to avoid spreading bacteria and viruses.  Only take over-the-counter or prescription medicines for pain, discomfort, or fever as directed by your caregiver.  Ask your caregiver if you should continue all prescribed and over-the-counter medicines.  Keep all follow-up appointments with your caregiver. SEEK MEDICAL CARE IF:  You have abdominal pain and it increases or stays in one area (localizes).  You have a rash, stiff neck, or severe headache.  You are irritable, sleepy, or difficult to awaken.  You are weak, dizzy, or extremely thirsty. SEEK IMMEDIATE MEDICAL CARE IF:   You are unable to keep fluids down or you get worse despite treatment.  You have frequent episodes of vomiting or diarrhea.  You have blood or green matter (bile) in your vomit.  You have blood in your stool or your stool looks black and tarry.  You have not urinated in 6 to 8 hours, or you have only urinated a small amount of very dark urine.  You have a fever.  You faint. MAKE SURE YOU:   Understand these instructions.  Will watch your condition.  Will get help right away if you are not doing well or get worse. Document Released: 12/09/2005 Document Revised: 03/02/2012 Document Reviewed: 07/29/2011 [facility] Patient Information 2013 [place], [place].

## 2013-03-04 NOTE — Progress Notes (Signed)
OFFICE PROGRESS NOTE  CC  Jane Hy, MD [address] [place] [address] Dr. Emelia Loron Dr. Chipper Herb Dr. Starleen Arms Dr. Dorothy Puffer  DIAGNOSIS: 57 year old female who presented on 12/24/2012 which new diagnosis of breast cancer  PRIOR THERAPY:  #1 patient has multiple medical problems but recently she had a mammogram performed that showed a 5.4 cm area of calcifications. MRI of the breasts showed the area extended to about 7.2 cm. Needle core biopsy performed showed a high-grade DCIS that was ER positive PR positive.  #2 she was seen by Dr. Emelia Loron regarding surgical options. Patient wanted to have bilateral mastectomies. She went on to have this performed on 12/31/2012. The final pathology did reveal a 2.3 cm invasive ductal carcinoma in the left breast. The tumor was ER +100% PR +86% HER-2/neu was amplified with a ratio of 6.81. Ki-67 was elevated at 46%. She has had immediate reconstruction.  #3 patient is seen back in medical oncology for discussion of adjuvant treatment. Since patient is HER-2/neu positive recommendation is her 2-based therapy consisting of Taxotere carboplatinum and Herceptin. The Taxotere or and carboplatinum will be given every 3 weeks for 6 cycles. She will receive Neulasta on day 2. Herceptin will be given weekly with duration of chemotherapy and then every 3 weeks to finish out a one-year of treatment. Patient also understands that she will be placed on antiestrogen therapy with an aromatase inhibitor or tamoxifen.  CURRENT THERAPY: Today is cycle 2 day 8 of TCH with weekly herceptin.  INTERVAL HISTORY:  Jane Gutierrez is doing well today.  She is here for her weekly herceptin.  She has been nauseated this week, using her anti-emetics and is having 4-5 episodes of diarrhea per day.  She has developed cracking in the corners of her mouth, but no mouth lesions with this cycle. She denies fevers, chills, pain, or any other concerns.   Otherwise a 10 point ROS is neg.   MEDICAL HISTORY: Past Medical History  Diagnosis Date  . IBS (irritable bowel syndrome)   . ADD (attention deficit disorder)   . Lyme disease   . Contact lens/glasses fitting     wears contacts or glasses  . Breast cancer 12/04/12    left, ER/PR +  . Allergy   . Asthma   . Thyroid disease   . Hypothyroidism     ALLERGIES:  is allergic to betadine; iodine; milk-related compounds; penicillins; shellfish-derived products; sulfamethoxazole w-trimethoprim; and cleocin.  MEDICATIONS:  Current Outpatient Prescriptions  Medication Sig Dispense Refill  . albuterol (PROVENTIL HFA;VENTOLIN HFA) 108 (90 BASE) MCG/ACT inhaler Inhale 2 puffs into the lungs every 6 (six) hours as needed.      [name] dexamethasone (DECADRON) 4 MG tablet Take 2 tablets (8 mg total) by mouth 2 (two) times daily with a meal. Take two times a day the day before Taxotere. Then take two times a day starting the day after chemo for 3 days.  30 tablet  1  . dextroamphetamine (DEXTROSTAT) 5 MG tablet Take 5 mg by mouth 3 (three) times daily.      . eszopiclone (LUNESTA) 2 MG TABS Take 2 mg by mouth at bedtime. Take immediately before bedtime      . levothyroxine (SYNTHROID, LEVOTHROID) 100 MCG tablet Take 100 mcg by mouth daily.      [name] lidocaine-prilocaine (EMLA) cream Apply cream to PAC site 1-2 hours prior to procedure.  30 Gutierrez  1  . LORazepam (ATIVAN) 0.5 MG tablet TAKE 1  TABLET BY MOUTH EVERY 6 HOURS AS NEEDED FOR NAUSEA OR VOMITING  30 tablet  0  . ondansetron (ZOFRAN) 8 MG tablet Take 1 tablet (8 mg total) by mouth 2 (two) times daily. Take two times a day starting the day after chemo for 3 days. Then take two times a day as needed for nausea or vomiting.  30 tablet  1  . prochlorperazine (COMPAZINE) 10 MG tablet Take 1 tablet (10 mg total) by mouth every 6 (six) hours as needed (Nausea or vomiting).  30 tablet  1  . prochlorperazine (COMPAZINE) 25 MG suppository Place 1 suppository (25 mg  total) rectally every 12 (twelve) hours as needed for nausea.  12 suppository  3  . promethazine (PHENERGAN) 25 MG tablet Take 25 mg by mouth every 6 (six) hours as needed.      . sennosides-docusate sodium (SENOKOT-S) 8.6-50 MG tablet Take 1 tablet by mouth daily.      . ciprofloxacin (CIPRO) 500 MG tablet Take 1 tablet (500 mg total) by mouth 2 (two) times daily.  14 tablet  5  . famotidine (PEPCID) 20 MG tablet Take 20 mg by mouth as needed.       Current Facility-Administered Medications  Medication Dose Route Frequency Provider Last Rate Last Dose  . 0.9 %  sodium chloride infusion   Intravenous Once Augustin Schooling, NP        SURGICAL HISTORY:  Past Surgical History  Procedure Laterality Date  . Brain surgery      removal benign frontal lobe cyst in 1987  . Foot surgery  2013    bilateral  . Gynecologic cryosurgery      cervical cancer, 1998 last remained disease free with recent normal pap  . Diagnostic laparoscopy  2004    lysis adh-uterine ablation  . Breast surgery      bilateral mastopexy  . Breast surgery      removal benign left breast lesion  . Abdominal hysterectomy  2005    TAH/BSO-lysis adh, enometriosis  . Face lift    . Axillary sentinel node biopsy  12/31/2012    Procedure: AXILLARY SENTINEL NODE BIOPSY;  Surgeon: Emelia Loron, MD;  Location: [facility];  Service: General;  Laterality: Bilateral;  bilateral sentinel node  . Total mastectomy  12/31/2012    Procedure: TOTAL MASTECTOMY;  Surgeon: Emelia Loron, MD;  Location: [facility];  Service: General;  Laterality: Bilateral;  bilateral total mastectomies  . Breast reconstruction with placement of tissue expander and flex hd (acellular hydrated dermis)  12/31/2012    Procedure: BREAST RECONSTRUCTION WITH PLACEMENT OF TISSUE EXPANDER AND FLEX HD (ACELLULAR HYDRATED DERMIS);  Surgeon: Wayland Denis, DO;  Location: [facility];  Service: Plastics;  Laterality:  Bilateral;  bilateral immediate breast reconstruction with expanders and flex hd   . Portacath placement  01/27/2013    Procedure: INSERTION PORT-A-CATH;  Surgeon: Emelia Loron, MD;  Location: [facility];  Service: General;  Laterality: Right;    REVIEW OF SYSTEMS:  General: fatigue (-), night sweats (-), fever (-), pain (-) Lymph: palpable nodes (-) HEENT: vision changes (-), mucositis (-), gum bleeding (-), epistaxis (-) Cardiovascular: chest pain (-), palpitations (-) Pulmonary: shortness of breath (-), dyspnea on exertion (-), cough (-), hemoptysis (-) GI:  Early satiety (-), melena (-), dysphagia (-), nausea/vomiting (-), diarrhea (-) GU: dysuria (-), hematuria (-), incontinence (-) Musculoskeletal: joint swelling (-), joint pain (-), back pain (-) Neuro: weakness (-), numbness (-),  headache (-), confusion (-) Skin: Rash (-), lesions (-), dryness (-) Psych: depression (-), suicidal/homicidal ideation (-), feeling of hopelessness (-)  PHYSICAL EXAMINATION: Blood pressure 134/79, pulse 97, temperature 98.4 F (36.9 C), temperature source Oral, resp. rate 20, height 5\' 5"  (1.651 m), weight 155 lb 11.2 oz (70.625 kg). Body mass index is 25.91 kg/(m^2). ECOG PERFORMANCE STATUS: 1 - Symptomatic but completely ambulatory General: Patient is a well appearing female in no acute distress HEENT: PERRLA, sclerae anicteric no conjunctival pallor, MMM Neck: supple, no palpable adenopathy Lungs: clear to auscultation bilaterally, no wheezes, rhonchi, or rales Cardiovascular: regular rate rhythm, S1, S2, no murmurs, rubs or gallops Abdomen: Soft, non-tender, non-distended, normoactive bowel sounds, no HSM Extremities: warm and well perfused, no clubbing, cyanosis, or edema Skin: No rashes or lesions Neuro: Non-focal Breasts: She status post bilateral mastectomies with expander is in place. The cosmetic result is symmetrical. The incisions are healing very nicely. Both axillae are  benign.  LABORATORY DATA: Lab Results  Component Value Date   WBC 4.2 03/04/2013   HGB 12.1 03/04/2013   HCT 35.5 03/04/2013   MCV 82.4 03/04/2013   PLT 110* 03/04/2013      Chemistry      Component Value Date/Time   NA 138 02/25/2013 0825   K 3.6 02/25/2013 0825   CL 103 02/25/2013 0825   CO2 27 02/25/2013 0825   BUN 24.5 02/25/2013 0825   CREATININE 0.6 02/25/2013 0825      Component Value Date/Time   CALCIUM 8.9 02/25/2013 0825   ALKPHOS 47 02/25/2013 0825   AST 20 02/25/2013 0825   ALT 29 02/25/2013 0825   BILITOT 0.35 02/25/2013 0825     12/31/2012 ADDITIONAL INFORMATION: 1. CHROMOGENIC IN-SITU HYBRIDIZATION Interpretation: HER2/NEU BY CISH - SHOWS AMPLIFICATION BY CISH ANALYSIS. THE RATIO OF HER2: CEP 17 SIGNALS WAS 6.81 Reference range: Ratio: HER2:CEP17 < 1.8 gene amplification not observed Ratio: HER2:CEP 17 1.8-2.2 - equivocal result Ratio: HER2:CEP17 > 2.2 - gene amplification observed Jane Leisure MD Pathologist, Electronic Signature ( Signed 01/11/2013) 1. PROGNOSTIC INDICATORS - ACIS Results IMMUNOHISTOCHEMICAL AND MORPHOMETRIC ANALYSIS BY THE AUTOMATED CELLULAR IMAGING SYSTEM (ACIS) Estrogen Receptor (Negative, <1%): 99%, STRONG STAINING INTENSITY Progesterone Receptor (Negative, <1%): 10%, STRONG STAINING INTENSITY Proliferation Marker Ki67 by M IB-1 (Low<20%): 46% All controls stained appropriately Jane Leisure MD Pathologist, Electronic Signature ( Signed 01/06/2013) 1 of 4 FINAL for HULL, Jane Gutierrez [phone]) FINAL DIAGNOSIS Diagnosis 1. Breast, simple mastectomy, Left - INVASIVE DUCTAL CARCINOMA, GRADE II (2.3 CM), SEE COMMENT. - LYMPHOVASCULAR INVASION IDENTIFIED - INVASIVE TUMOR IS 3 CM FROM THE NEAREST MARGIN (DEEP). - HIGH GRADE DUCTAL CARCINOMA IN SITU WITH COMEDO NECROSIS AND CALCIFICATIONS. - SEE TUMOR SYNOPTIC TEMPLATE BELOW. 2. Lymph node, sentinel, biopsy, Left axillary - ONE LYMPH NODE, NEGATIVE FOR TUMOR (0/1). 3. Breast, simple mastectomy, Right -  BENIGN BREAST TISSUE, SEE COMMENT. - NEGATIVE FOR ATYPIA OR MALIGNANCY. 4. Lymph node, sentinel, biopsy, Right axillary - ONE LYMPH NODE, NEGATIVE FOR TUMOR (0/1). Microscopic Comment 1. BREAST, INVASIVE TUMOR, WITH LYMPH NODE SAMPLING Specimen, including laterality: Left breast Procedure: Simple mastectomy Grade: II of III Tubule formation: 3 Nuclear pleomorphism: 2 Mitotic: 1 Tumor size (gross measurement) 2.3 cm Margins: Invasive, distance to closest margin: 3.0 cm In-situ, distance to closest margin: 3.0 cm (deep) If margin positive, focally or broadly: N/A Lymphovascular invasion: Present, extensive Ductal carcinoma in situ: Present Grade: III of III Extensive intraductal component: Absent Lobular neoplasia: Absent Tumor focality: Unifocal Treatment effect: None If present,  treatment effect in breast tissue, lymph nodes or both: N/A Extent of tumor: Skin: Grossly negative for tumor Nipple: Grossly negative for tumor Skeletal muscle: Microscopically negative for tumor Lymph nodes: # examined: 1 Lymph nodes with metastasis: 0 Breast prognostic profile: Estrogen receptor: Repeated, previous study shows 100% positivity. [phone]) Progesterone receptor: Repeated, previous study demonstrated 86% positivity ([id number]) Her 2 neu: Pending and will be reported in an addendum. Ki-67: Pending and will be reported in an addendum. Non-neoplastic breast: No significant findings. TNM: pT2, pN0, pMX Comments: None. (CR:kh 01-04-13) 3. The entire 2.0 cm previous biopsy hematoma is submitted for histopathologic review. On review, there is 2 of 4 FINAL for HULL, Jane Gutierrez [phone]) Microscopic Comment(continued) no in situ or invasive carcinoma identified in any of the slide sections examined. Additional non-neoplastic findings include benign fibrocystic change, pseudoangiomatous stromal hyperplasia, fibroadenomatoid nodules, periductal chronic inflammation with associated stromal  fibrosis and microcalcifications in benign ducts and lobules. Pathologic Stage is pTX pN0, PMX. The surgical resection margin(s) of the specimen were inked and microscopically evaluated. [place] [facility] RADIOGRAPHIC STUDIES:  Mr Biopsy/wire Localization  12/25/2012  *RADIOLOGY REPORT*  Clinical Data:  6 mm enhancing mass in the upper right breast.  The patient has recent diagnosis of left breast DCIS.  MRI GUIDED VACUUM ASSISTED BIOPSY OF THE RIGHT BREAST WITHOUT AND WITH CONTRAST  Comparison: Previous exams.  Technique: Multiplanar, multisequence MR images of the right breast were obtained prior to and following the intravenous administration of 15 ml of Mulithance.  I met with the patient, and we discussed the procedure of MRI guided biopsy, including risks, benefits, and alternatives. Specifically, we discussed the risks of infection, bleeding, tissue injury, clip migration, and inadequate sampling.  Informed, written consent was given.  Using sterile technique, 2% Lidocaine, MRI guidance, and a 9 gauge vacuum assisted device, biopsy was performed of a 6 mm enhancing mass using a lateral to medial approach.  At the conclusion of the procedure, a tissue marker clip was deployed into the biopsy cavity.  IMPRESSION: MRI guided biopsy of the right breast. No apparent complications.  THREE-DIMENSIONAL MR IMAGE RENDERING ON INDEPENDENT WORKSTATION:  Three-dimensional MR images were rendered by post-processing of the original MR data on an independent workstation.  The three- dimensional MR images were interpreted, and findings were reported in the accompanying complete MRI report for this study.   Original Report Authenticated By: Britta Mccreedy, M.D.    Nm Sentinel Node Inj-no Rpt (breast)  12/31/2012  CLINICAL DATA: right axillary sentinel node biopsy   Sulfur colloid was injected intradermally by the nuclear medicine  technologist for breast cancer sentinel node localization.     Nm Sentinel Node Inj-no Rpt  (breast)  12/31/2012  CLINICAL DATA: left axillary sentinel node biopsy   Sulfur colloid was injected intradermally by the nuclear medicine  technologist for breast cancer sentinel node localization.     Mm Digital Diagnostic Unilat R  12/25/2012  *RADIOLOGY REPORT*  Clinical Data:  MRI guided core needle biopsy of a 6 mm nodule of enhancement in the upper right breast was performed.  DIGITAL DIAGNOSTIC RIGHT MAMMOGRAM  Comparison:  Previous exams.  Findings:  Films are performed following MRI guided biopsy of 6 mm enhancing nodule in the upper central right breast.  A dumbbell shaped biopsy clip is present in the upper right breast just medial to the level of the nipple.  The clip is within a biopsy cavity that contains air and a small air-fluid level.  IMPRESSION: Satisfactory position  of biopsy clip in the upper right breast.   Original Report Authenticated By: Britta Mccreedy, M.D.     ASSESSMENT: 57 year old [place] woman  (1) status post bilateral mastectomies in 12/31/2012 showing  (a) on the right, no evidence of malignancy  (b) on the left, a pT2 pN0, stage IIA invasive ductal carcinoma, grade 2, estrogen receptor 100% positive, progesterone receptor 86% positive, with an MIB-1 of 46%, and HER-2 amplification with a CISH ratio of 6.81  (2) starting adjuvant chemotherapy with carboplatin, docetaxel and trastuzumab every 13 2014, to consist of 6 cycles.  (3) trastuzumab to be continued to total one year; most recent echocardiogram 01/26/2013  4. Neutropenia  5. Dehydration  PLAN:   1. Jane Gutierrez is doing well today.  She will proceed with weekly herceptin.  She is neutropenic.  I prescribed cipro and reviewed neutropenic precautions with her in detail.  We will also give her IV fluids today for her dehydration.    2. I will see her back next week for weekly Herceptin.   All questions were answered. The patient knows to call the clinic with any problems, questions or concerns. We can  certainly see the patient much sooner if necessary.   I spent 25 minutes counseling the patient face to face. The total time spent in the appointment was 30 minutes.    Cherie Ouch Lyn Hollingshead, NP Medical Oncology [facility] Phone: [phone]

## 2013-03-05 ENCOUNTER — Other Ambulatory Visit: Payer: Self-pay | Admitting: Certified Registered Nurse Anesthetist

## 2013-03-11 ENCOUNTER — Telehealth: Payer: Self-pay | Admitting: Oncology

## 2013-03-11 ENCOUNTER — Ambulatory Visit (HOSPITAL_BASED_OUTPATIENT_CLINIC_OR_DEPARTMENT_OTHER): Payer: 59 | Admitting: Adult Health

## 2013-03-11 ENCOUNTER — Ambulatory Visit (HOSPITAL_BASED_OUTPATIENT_CLINIC_OR_DEPARTMENT_OTHER): Payer: 59 | Admitting: Lab

## 2013-03-11 ENCOUNTER — Encounter: Payer: Self-pay | Admitting: Oncology

## 2013-03-11 ENCOUNTER — Ambulatory Visit (HOSPITAL_BASED_OUTPATIENT_CLINIC_OR_DEPARTMENT_OTHER): Payer: 59

## 2013-03-11 ENCOUNTER — Encounter: Payer: Self-pay | Admitting: Adult Health

## 2013-03-11 VITALS — BP 114/72 | HR 81 | Temp 98.0°F | Resp 20 | Ht 65.0 in | Wt 160.4 lb

## 2013-03-11 DIAGNOSIS — C50412 Malignant neoplasm of upper-outer quadrant of left female breast: Secondary | ICD-10-CM

## 2013-03-11 DIAGNOSIS — C50419 Malignant neoplasm of upper-outer quadrant of unspecified female breast: Secondary | ICD-10-CM

## 2013-03-11 DIAGNOSIS — E860 Dehydration: Secondary | ICD-10-CM

## 2013-03-11 DIAGNOSIS — R110 Nausea: Secondary | ICD-10-CM

## 2013-03-11 DIAGNOSIS — Z5112 Encounter for antineoplastic immunotherapy: Secondary | ICD-10-CM

## 2013-03-11 DIAGNOSIS — D709 Neutropenia, unspecified: Secondary | ICD-10-CM

## 2013-03-11 LAB — COMPREHENSIVE METABOLIC PANEL (CC13)
Albumin: 3 g/dL — ABNORMAL LOW (ref 3.5–5.0)
CO2: 26 mEq/L (ref 22–29)
Calcium: 8.6 mg/dL (ref 8.4–10.4)
Chloride: 105 mEq/L (ref 98–107)
Glucose: 100 mg/dl — ABNORMAL HIGH (ref 70–99)
Potassium: 4.1 mEq/L (ref 3.5–5.1)
Sodium: 138 mEq/L (ref 136–145)
Total Bilirubin: 0.25 mg/dL (ref 0.20–1.20)
Total Protein: 6.3 g/dL — ABNORMAL LOW (ref 6.4–8.3)

## 2013-03-11 LAB — CBC WITH DIFFERENTIAL/PLATELET
Basophils Absolute: 0 10*3/uL (ref 0.0–0.1)
Eosinophils Absolute: 0 10*3/uL (ref 0.0–0.5)
HGB: 12 g/dL (ref 11.6–15.9)
MCV: 83.7 fL (ref 79.5–101.0)
MONO#: 0.6 10*3/uL (ref 0.1–0.9)
MONO%: 7.3 % (ref 0.0–14.0)
NEUT#: 5.1 10*3/uL (ref 1.5–6.5)
RDW: 15.5 % — ABNORMAL HIGH (ref 11.2–14.5)

## 2013-03-11 MED ORDER — SODIUM CHLORIDE 0.9 % IV SOLN
Freq: Once | INTRAVENOUS | Status: AC
  Administered 2013-03-11: 10:00:00 via INTRAVENOUS

## 2013-03-11 MED ORDER — SODIUM CHLORIDE 0.9 % IJ SOLN
10.0000 mL | INTRAMUSCULAR | Status: DC | PRN
  Administered 2013-03-11: 10 mL
  Filled 2013-03-11: qty 10

## 2013-03-11 MED ORDER — DIPHENHYDRAMINE HCL 25 MG PO CAPS
50.0000 mg | ORAL_CAPSULE | Freq: Once | ORAL | Status: AC
  Administered 2013-03-11: 25 mg via ORAL

## 2013-03-11 MED ORDER — ACETAMINOPHEN 325 MG PO TABS
650.0000 mg | ORAL_TABLET | Freq: Once | ORAL | Status: AC
  Administered 2013-03-11: 650 mg via ORAL

## 2013-03-11 MED ORDER — TRASTUZUMAB CHEMO INJECTION 440 MG
2.0000 mg/kg | Freq: Once | INTRAVENOUS | Status: AC
  Administered 2013-03-11: 147 mg via INTRAVENOUS
  Filled 2013-03-11: qty 7

## 2013-03-11 MED ORDER — HEPARIN SOD (PORK) LOCK FLUSH 100 UNIT/ML IV SOLN
500.0000 [IU] | Freq: Once | INTRAVENOUS | Status: AC | PRN
  Administered 2013-03-11: 500 [IU]
  Filled 2013-03-11: qty 5

## 2013-03-11 MED ORDER — ONDANSETRON 8 MG PO TBDP: 8.0000 mg | ORAL_TABLET | Freq: Three times a day (TID) | ORAL | Status: DC | PRN

## 2013-03-11 NOTE — Telephone Encounter (Signed)
gv pt appt schedule for March thru July.

## 2013-03-11 NOTE — Patient Instructions (Signed)
[facility] Discharge Instructions for Patients Receiving Chemotherapy  Today you received the following chemotherapy agents: herceptin  To help prevent nausea and vomiting after your treatment, we encourage you to take your nausea medication.  Take it as often as prescribed.     If you develop nausea and vomiting that is not controlled by your nausea medication, call the clinic. If it is after clinic hours your family physician or the after hours number for the clinic or go to the Emergency Department.   BELOW ARE SYMPTOMS THAT SHOULD BE REPORTED IMMEDIATELY:  *FEVER GREATER THAN 100.5 F  *CHILLS WITH OR WITHOUT FEVER  NAUSEA AND VOMITING THAT IS NOT CONTROLLED WITH YOUR NAUSEA MEDICATION  *UNUSUAL SHORTNESS OF BREATH  *UNUSUAL BRUISING OR BLEEDING  TENDERNESS IN MOUTH AND THROAT WITH OR WITHOUT PRESENCE OF ULCERS  *URINARY PROBLEMS  *BOWEL PROBLEMS  UNUSUAL RASH Items with * indicate a potential emergency and should be followed up as soon as possible.  Feel free to call the clinic you have any questions or concerns. The clinic phone number is [phone].   I have been informed and understand all the instructions given to me. I know to contact the clinic, my physician, or go to the Emergency Department if any problems should occur. I do not have any questions at this time, but understand that I may call the clinic during office hours   should I have any questions or need assistance in obtaining follow up care.    __________________________________________  _____________  __________ Signature of Patient or Authorized Representative            Date                   Time    __________________________________________ Nurse's Signature    

## 2013-03-11 NOTE — Progress Notes (Signed)
OFFICE PROGRESS NOTE  CC  Jane Hy, MD [address] [place] [address] Dr. Emelia Loron Dr. Chipper Herb Dr. Starleen Arms Dr. Dorothy Puffer  DIAGNOSIS: 57 year old female who presented on 12/24/2012 which new diagnosis of breast cancer  PRIOR THERAPY:  #1 patient has multiple medical problems but recently she had a mammogram performed that showed a 5.4 cm area of calcifications. MRI of the breasts showed the area extended to about 7.2 cm. Needle core biopsy performed showed a high-grade DCIS that was ER positive PR positive.  #2 she was seen by Dr. Emelia Loron regarding surgical options. Patient wanted to have bilateral mastectomies. She went on to have this performed on 12/31/2012. The final pathology did reveal a 2.3 cm invasive ductal carcinoma in the left breast. The tumor was ER +100% PR +86% HER-2/neu was amplified with a ratio of 6.81. Ki-67 was elevated at 46%. She has had immediate reconstruction.  #3 patient is seen back in medical oncology for discussion of adjuvant treatment. Since patient is HER-2/neu positive recommendation is her 2-based therapy consisting of Taxotere carboplatinum and Herceptin. The Taxotere or and carboplatinum will be given every 3 weeks for 6 cycles. She will receive Neulasta on day 2. Herceptin will be given weekly with duration of chemotherapy and then every 3 weeks to finish out a one-year of treatment. Patient also understands that she will be placed on antiestrogen therapy with an aromatase inhibitor or tamoxifen.  CURRENT THERAPY: Today is cycle 2 day 15 of TCH with weekly herceptin.  INTERVAL HISTORY:  Jane Gutierrez is doing well today.  She is here for her weekly herceptin.  She feels much better this week.  Her energy level is improved and the IV fluids really helped her and she is no longer nauseated.  She is requesting Zofran ODT to try to help with her nausea.  She denies fevers, chills, nausea, vomiting, constipation, diarrhea,  numbness or any other concerns.  A 10 point ROS is neg.    MEDICAL HISTORY: Past Medical History  Diagnosis Date  . IBS (irritable bowel syndrome)   . ADD (attention deficit disorder)   . Lyme disease   . Contact lens/glasses fitting     wears contacts or glasses  . Breast cancer 12/04/12    left, ER/PR +  . Allergy   . Asthma   . Thyroid disease   . Hypothyroidism     ALLERGIES:  is allergic to betadine; iodine; milk-related compounds; penicillins; shellfish-derived products; sulfamethoxazole w-trimethoprim; and cleocin.  MEDICATIONS:  Current Outpatient Prescriptions  Medication Sig Dispense Refill  . albuterol (PROVENTIL HFA;VENTOLIN HFA) 108 (90 BASE) MCG/ACT inhaler Inhale 2 puffs into the lungs every 6 (six) hours as needed.      . ciprofloxacin (CIPRO) 500 MG tablet Take 1 tablet (500 mg total) by mouth 2 (two) times daily.  14 tablet  5  . dexamethasone (DECADRON) 4 MG tablet Take 2 tablets (8 mg total) by mouth 2 (two) times daily with a meal. Take two times a day the day before Taxotere. Then take two times a day starting the day after chemo for 3 days.  30 tablet  1  . dextroamphetamine (DEXTROSTAT) 5 MG tablet Take 5 mg by mouth 3 (three) times daily.      . eszopiclone (LUNESTA) 2 MG TABS Take 2 mg by mouth at bedtime. Take immediately before bedtime      . famotidine (PEPCID) 20 MG tablet Take 20 mg by mouth as needed.      [name]  levothyroxine (SYNTHROID, LEVOTHROID) 100 MCG tablet Take 100 mcg by mouth daily.      [name] lidocaine-prilocaine (EMLA) cream Apply cream to PAC site 1-2 hours prior to procedure.  30 Gutierrez  1  . LORazepam (ATIVAN) 0.5 MG tablet TAKE 1 TABLET BY MOUTH EVERY 6 HOURS AS NEEDED FOR NAUSEA OR VOMITING  30 tablet  0  . ondansetron (ZOFRAN) 8 MG tablet Take 1 tablet (8 mg total) by mouth 2 (two) times daily. Take two times a day starting the day after chemo for 3 days. Then take two times a day as needed for nausea or vomiting.  30 tablet  1  . prochlorperazine  (COMPAZINE) 10 MG tablet Take 1 tablet (10 mg total) by mouth every 6 (six) hours as needed (Nausea or vomiting).  30 tablet  1  . prochlorperazine (COMPAZINE) 25 MG suppository Place 1 suppository (25 mg total) rectally every 12 (twelve) hours as needed for nausea.  12 suppository  3  . promethazine (PHENERGAN) 25 MG tablet Take 25 mg by mouth every 6 (six) hours as needed.      . sennosides-docusate sodium (SENOKOT-S) 8.6-50 MG tablet Take 1 tablet by mouth daily.       No current facility-administered medications for this visit.    SURGICAL HISTORY:  Past Surgical History  Procedure Laterality Date  . Brain surgery      removal benign frontal lobe cyst in 1987  . Foot surgery  2013    bilateral  . Gynecologic cryosurgery      cervical cancer, 1998 last remained disease free with recent normal pap  . Diagnostic laparoscopy  2004    lysis adh-uterine ablation  . Breast surgery      bilateral mastopexy  . Breast surgery      removal benign left breast lesion  . Abdominal hysterectomy  2005    TAH/BSO-lysis adh, enometriosis  . Face lift    . Axillary sentinel node biopsy  12/31/2012    Procedure: AXILLARY SENTINEL NODE BIOPSY;  Surgeon: Emelia Loron, MD;  Location: [facility];  Service: General;  Laterality: Bilateral;  bilateral sentinel node  . Total mastectomy  12/31/2012    Procedure: TOTAL MASTECTOMY;  Surgeon: Emelia Loron, MD;  Location: [facility];  Service: General;  Laterality: Bilateral;  bilateral total mastectomies  . Breast reconstruction with placement of tissue expander and flex hd (acellular hydrated dermis)  12/31/2012    Procedure: BREAST RECONSTRUCTION WITH PLACEMENT OF TISSUE EXPANDER AND FLEX HD (ACELLULAR HYDRATED DERMIS);  Surgeon: Wayland Denis, DO;  Location: [facility];  Service: Plastics;  Laterality: Bilateral;  bilateral immediate breast reconstruction with expanders and flex hd   . Portacath placement   01/27/2013    Procedure: INSERTION PORT-A-CATH;  Surgeon: Emelia Loron, MD;  Location: [facility];  Service: General;  Laterality: Right;    REVIEW OF SYSTEMS:  General: fatigue (-), night sweats (-), fever (-), pain (-) Lymph: palpable nodes (-) HEENT: vision changes (-), mucositis (-), gum bleeding (-), epistaxis (-) Cardiovascular: chest pain (-), palpitations (-) Pulmonary: shortness of breath (-), dyspnea on exertion (-), cough (-), hemoptysis (-) GI:  Early satiety (-), melena (-), dysphagia (-), nausea/vomiting (-), diarrhea (-) GU: dysuria (-), hematuria (-), incontinence (-) Musculoskeletal: joint swelling (-), joint pain (-), back pain (-) Neuro: weakness (-), numbness (-), headache (-), confusion (-) Skin: Rash (-), lesions (-), dryness (-) Psych: depression (-), suicidal/homicidal ideation (-), feeling of hopelessness (-)  PHYSICAL EXAMINATION: Blood pressure 114/72, pulse 81, temperature 98 F (36.7 C), temperature source Oral, resp. rate 20, height 5\' 5"  (1.651 m), weight 160 lb 6.4 oz (72.757 kg). Body mass index is 26.69 kg/(m^2). ECOG PERFORMANCE STATUS: 1 - Symptomatic but completely ambulatory General: Patient is a well appearing female in no acute distress HEENT: PERRLA, sclerae anicteric no conjunctival pallor, MMM Neck: supple, no palpable adenopathy Lungs: clear to auscultation bilaterally, no wheezes, rhonchi, or rales Cardiovascular: regular rate rhythm, S1, S2, no murmurs, rubs or gallops Abdomen: Soft, non-tender, non-distended, normoactive bowel sounds, no HSM Extremities: warm and well perfused, no clubbing, cyanosis, or edema Skin: No rashes or lesions Neuro: Non-focal Breasts: She status post bilateral mastectomies with expander is in place. The cosmetic result is symmetrical. The incisions are healing very nicely. Both axillae are benign.  LABORATORY DATA: Lab Results  Component Value Date   WBC 7.8 03/11/2013   HGB 12.0 03/11/2013    HCT 35.9 03/11/2013   MCV 83.7 03/11/2013   PLT 130* 03/11/2013      Chemistry      Component Value Date/Time   NA 137 03/04/2013 1302   K 3.7 03/04/2013 1302   CL 101 03/04/2013 1302   CO2 27 03/04/2013 1302   BUN 14.8 03/04/2013 1302   CREATININE 0.6 03/04/2013 1302      Component Value Date/Time   CALCIUM 9.3 03/04/2013 1302   ALKPHOS 52 03/04/2013 1302   AST 22 03/04/2013 1302   ALT 39 03/04/2013 1302   BILITOT 0.45 03/04/2013 1302     12/31/2012 ADDITIONAL INFORMATION: 1. CHROMOGENIC IN-SITU HYBRIDIZATION Interpretation: HER2/NEU BY CISH - SHOWS AMPLIFICATION BY CISH ANALYSIS. THE RATIO OF HER2: CEP 17 SIGNALS WAS 6.81 Reference range: Ratio: HER2:CEP17 < 1.8 gene amplification not observed Ratio: HER2:CEP 17 1.8-2.2 - equivocal result Ratio: HER2:CEP17 > 2.2 - gene amplification observed Jane Leisure MD Pathologist, Electronic Signature ( Signed 01/11/2013) 1. PROGNOSTIC INDICATORS - ACIS Results IMMUNOHISTOCHEMICAL AND MORPHOMETRIC ANALYSIS BY THE AUTOMATED CELLULAR IMAGING SYSTEM (ACIS) Estrogen Receptor (Negative, <1%): 99%, STRONG STAINING INTENSITY Progesterone Receptor (Negative, <1%): 10%, STRONG STAINING INTENSITY Proliferation Marker Ki67 by M IB-1 (Low<20%): 46% All controls stained appropriately Jane Leisure MD Pathologist, Electronic Signature ( Signed 01/06/2013) 1 of 4 FINAL for HULL, Jane Gutierrez [phone]) FINAL DIAGNOSIS Diagnosis 1. Breast, simple mastectomy, Left - INVASIVE DUCTAL CARCINOMA, GRADE II (2.3 CM), SEE COMMENT. - LYMPHOVASCULAR INVASION IDENTIFIED - INVASIVE TUMOR IS 3 CM FROM THE NEAREST MARGIN (DEEP). - HIGH GRADE DUCTAL CARCINOMA IN SITU WITH COMEDO NECROSIS AND CALCIFICATIONS. - SEE TUMOR SYNOPTIC TEMPLATE BELOW. 2. Lymph node, sentinel, biopsy, Left axillary - ONE LYMPH NODE, NEGATIVE FOR TUMOR (0/1). 3. Breast, simple mastectomy, Right - BENIGN BREAST TISSUE, SEE COMMENT. - NEGATIVE FOR ATYPIA OR MALIGNANCY. 4. Lymph node, sentinel,  biopsy, Right axillary - ONE LYMPH NODE, NEGATIVE FOR TUMOR (0/1). Microscopic Comment 1. BREAST, INVASIVE TUMOR, WITH LYMPH NODE SAMPLING Specimen, including laterality: Left breast Procedure: Simple mastectomy Grade: II of III Tubule formation: 3 Nuclear pleomorphism: 2 Mitotic: 1 Tumor size (gross measurement) 2.3 cm Margins: Invasive, distance to closest margin: 3.0 cm In-situ, distance to closest margin: 3.0 cm (deep) If margin positive, focally or broadly: N/A Lymphovascular invasion: Present, extensive Ductal carcinoma in situ: Present Grade: III of III Extensive intraductal component: Absent Lobular neoplasia: Absent Tumor focality: Unifocal Treatment effect: None If present, treatment effect in breast tissue, lymph nodes or both: N/A Extent of tumor: Skin: Grossly negative for tumor Nipple: Grossly negative for  tumor Skeletal muscle: Microscopically negative for tumor Lymph nodes: # examined: 1 Lymph nodes with metastasis: 0 Breast prognostic profile: Estrogen receptor: Repeated, previous study shows 100% positivity. [phone]) Progesterone receptor: Repeated, previous study demonstrated 86% positivity ([id number]) Her 2 neu: Pending and will be reported in an addendum. Ki-67: Pending and will be reported in an addendum. Non-neoplastic breast: No significant findings. TNM: pT2, pN0, pMX Comments: None. (CR:kh 01-04-13) 3. The entire 2.0 cm previous biopsy hematoma is submitted for histopathologic review. On review, there is 2 of 4 FINAL for HULL, Jane Gutierrez [phone]) Microscopic Comment(continued) no in situ or invasive carcinoma identified in any of the slide sections examined. Additional non-neoplastic findings include benign fibrocystic change, pseudoangiomatous stromal hyperplasia, fibroadenomatoid nodules, periductal chronic inflammation with associated stromal fibrosis and microcalcifications in benign ducts and lobules. Pathologic Stage is pTX pN0, PMX.  The surgical resection margin(s) of the specimen were inked and microscopically evaluated. Jane Gutierrez RADIOGRAPHIC STUDIES:  Mr Biopsy/wire Localization  12/25/2012  *RADIOLOGY REPORT*  Clinical Data:  6 mm enhancing mass in the upper right breast.  The patient has recent diagnosis of left breast DCIS.  MRI GUIDED VACUUM ASSISTED BIOPSY OF THE RIGHT BREAST WITHOUT AND WITH CONTRAST  Comparison: Previous exams.  Technique: Multiplanar, multisequence MR images of the right breast were obtained prior to and following the intravenous administration of 15 ml of Mulithance.  I met with the patient, and we discussed the procedure of MRI guided biopsy, including risks, benefits, and alternatives. Specifically, we discussed the risks of infection, bleeding, tissue injury, clip migration, and inadequate sampling.  Informed, written consent was given.  Using sterile technique, 2% Lidocaine, MRI guidance, and a 9 gauge vacuum assisted device, biopsy was performed of a 6 mm enhancing mass using a lateral to medial approach.  At the conclusion of the procedure, a tissue marker clip was deployed into the biopsy cavity.  IMPRESSION: MRI guided biopsy of the right breast. No apparent complications.  THREE-DIMENSIONAL MR IMAGE RENDERING ON INDEPENDENT WORKSTATION:  Three-dimensional MR images were rendered by post-processing of the original MR data on an independent workstation.  The three- dimensional MR images were interpreted, and findings were reported in the accompanying complete MRI report for this study.   Original Report Authenticated By: Britta Mccreedy, M.D.    Nm Sentinel Node Inj-no Rpt (breast)  12/31/2012  CLINICAL DATA: right axillary sentinel node biopsy   Sulfur colloid was injected intradermally by the nuclear medicine  technologist for breast cancer sentinel node localization.     Nm Sentinel Node Inj-no Rpt (breast)  12/31/2012  CLINICAL DATA: left axillary sentinel node biopsy   Sulfur colloid was injected  intradermally by the nuclear medicine  technologist for breast cancer sentinel node localization.     Mm Digital Diagnostic Unilat R  12/25/2012  *RADIOLOGY REPORT*  Clinical Data:  MRI guided core needle biopsy of a 6 mm nodule of enhancement in the upper right breast was performed.  DIGITAL DIAGNOSTIC RIGHT MAMMOGRAM  Comparison:  Previous exams.  Findings:  Films are performed following MRI guided biopsy of 6 mm enhancing nodule in the upper central right breast.  A dumbbell shaped biopsy clip is present in the upper right breast just medial to the level of the nipple.  The clip is within a biopsy cavity that contains air and a small air-fluid level.  IMPRESSION: Satisfactory position of biopsy clip in the upper right breast.   Original Report Authenticated By: Britta Mccreedy, M.D.     ASSESSMENT:  57 year old [place] woman  (1) status post bilateral mastectomies in 12/31/2012 showing  (a) on the right, no evidence of malignancy  (b) on the left, a pT2 pN0, stage IIA invasive ductal carcinoma, grade 2, estrogen receptor 100% positive, progesterone receptor 86% positive, with an MIB-1 of 46%, and HER-2 amplification with a CISH ratio of 6.81  (2) starting adjuvant chemotherapy with carboplatin, docetaxel and trastuzumab every 13 2014, to consist of 6 cycles.  (3) trastuzumab to be continued to total one year; most recent echocardiogram 01/26/2013  4. Neutropenia  5. Dehydration  PLAN:   1. Kayona is doing well today.  She will proceed with weekly herceptin. Her labs have recovered.  She is planning on surgery on 6/26 so we worked out her schedule around that time.  I prescribed Zofran ODT #20.  2. I will see her back next week for chemotherapy and evaluation.    All questions were answered. The patient knows to call the clinic with any problems, questions or concerns. We can certainly see the patient much sooner if necessary.   I spent 25 minutes counseling the patient face to face. The  total time spent in the appointment was 30 minutes.   This case was reviewed with Dr. Welton Flakes.    Jane Ouch Lyn Hollingshead, NP Medical Oncology [facility] Phone: [phone]

## 2013-03-11 NOTE — Patient Instructions (Addendum)
Doing well.  Proceed with weekly Herceptin.  Please call us if you have any questions or concerns.

## 2013-03-17 ENCOUNTER — Other Ambulatory Visit: Payer: 59 | Admitting: Lab

## 2013-03-17 ENCOUNTER — Ambulatory Visit: Payer: 59 | Admitting: Oncology

## 2013-03-18 ENCOUNTER — Ambulatory Visit (HOSPITAL_BASED_OUTPATIENT_CLINIC_OR_DEPARTMENT_OTHER): Payer: 59 | Admitting: Oncology

## 2013-03-18 ENCOUNTER — Encounter: Payer: Self-pay | Admitting: Oncology

## 2013-03-18 ENCOUNTER — Other Ambulatory Visit (HOSPITAL_BASED_OUTPATIENT_CLINIC_OR_DEPARTMENT_OTHER): Payer: 59 | Admitting: Lab

## 2013-03-18 ENCOUNTER — Ambulatory Visit (HOSPITAL_BASED_OUTPATIENT_CLINIC_OR_DEPARTMENT_OTHER): Payer: 59

## 2013-03-18 ENCOUNTER — Other Ambulatory Visit: Payer: Self-pay | Admitting: Medical Oncology

## 2013-03-18 ENCOUNTER — Ambulatory Visit: Payer: 59 | Admitting: Adult Health

## 2013-03-18 VITALS — BP 114/74 | HR 91 | Temp 97.7°F | Resp 20 | Ht 65.0 in | Wt 162.0 lb

## 2013-03-18 DIAGNOSIS — Z5111 Encounter for antineoplastic chemotherapy: Secondary | ICD-10-CM

## 2013-03-18 DIAGNOSIS — C50419 Malignant neoplasm of upper-outer quadrant of unspecified female breast: Secondary | ICD-10-CM

## 2013-03-18 DIAGNOSIS — Z170 Estrogen receptor positive status [ER+]: Secondary | ICD-10-CM

## 2013-03-18 DIAGNOSIS — E039 Hypothyroidism, unspecified: Secondary | ICD-10-CM

## 2013-03-18 DIAGNOSIS — C50412 Malignant neoplasm of upper-outer quadrant of left female breast: Secondary | ICD-10-CM

## 2013-03-18 DIAGNOSIS — D0510 Intraductal carcinoma in situ of unspecified breast: Secondary | ICD-10-CM

## 2013-03-18 LAB — COMPREHENSIVE METABOLIC PANEL (CC13)
AST: 23 U/L (ref 5–34)
Albumin: 3.3 g/dL — ABNORMAL LOW (ref 3.5–5.0)
BUN: 23.1 mg/dL (ref 7.0–26.0)
Calcium: 8.9 mg/dL (ref 8.4–10.4)
Chloride: 105 mEq/L (ref 98–107)
Glucose: 86 mg/dl (ref 70–99)
Potassium: 3.6 mEq/L (ref 3.5–5.1)
Sodium: 139 mEq/L (ref 136–145)
Total Protein: 6.3 g/dL — ABNORMAL LOW (ref 6.4–8.3)

## 2013-03-18 LAB — T4: T4, Total: 9.2 ug/dL (ref 5.0–12.5)

## 2013-03-18 LAB — TSH: TSH: 0.231 u[IU]/mL — ABNORMAL LOW (ref 0.350–4.500)

## 2013-03-18 LAB — CBC WITH DIFFERENTIAL/PLATELET
BASO%: 0.1 % (ref 0.0–2.0)
Basophils Absolute: 0 10*3/uL (ref 0.0–0.1)
HCT: 32.8 % — ABNORMAL LOW (ref 34.8–46.6)
HGB: 10.9 g/dL — ABNORMAL LOW (ref 11.6–15.9)
MONO#: 1.2 10*3/uL — ABNORMAL HIGH (ref 0.1–0.9)
NEUT%: 57.8 % (ref 38.4–76.8)
RDW: 16.4 % — ABNORMAL HIGH (ref 11.2–14.5)
WBC: 9.6 10*3/uL (ref 3.9–10.3)
lymph#: 2.8 10*3/uL (ref 0.9–3.3)

## 2013-03-18 LAB — T3: T3, Total: 79.4 ng/dL — ABNORMAL LOW (ref 80.0–204.0)

## 2013-03-18 MED ORDER — ACETAMINOPHEN 325 MG PO TABS
650.0000 mg | ORAL_TABLET | Freq: Once | ORAL | Status: AC
  Administered 2013-03-18: 650 mg via ORAL

## 2013-03-18 MED ORDER — LIDOCAINE-PRILOCAINE 2.5-2.5 % EX CREA: TOPICAL_CREAM | CUTANEOUS | Status: DC | PRN

## 2013-03-18 MED ORDER — DEXAMETHASONE SODIUM PHOSPHATE 4 MG/ML IJ SOLN
20.0000 mg | Freq: Once | INTRAMUSCULAR | Status: AC
  Administered 2013-03-18: 20 mg via INTRAVENOUS

## 2013-03-18 MED ORDER — DOCETAXEL CHEMO INJECTION 160 MG/16ML: 75.0000 mg/m2 | Freq: Once | INTRAVENOUS | Status: DC

## 2013-03-18 MED ORDER — LORAZEPAM 1 MG PO TABS: 1.0000 mg | ORAL_TABLET | Freq: Three times a day (TID) | ORAL | Status: DC

## 2013-03-18 MED ORDER — DIPHENHYDRAMINE HCL 25 MG PO CAPS
50.0000 mg | ORAL_CAPSULE | Freq: Once | ORAL | Status: AC
  Administered 2013-03-18: 50 mg via ORAL

## 2013-03-18 MED ORDER — SODIUM CHLORIDE 0.9 % IV SOLN
75.0000 mg/m2 | Freq: Once | INTRAVENOUS | Status: AC
  Administered 2013-03-18: 140 mg via INTRAVENOUS
  Filled 2013-03-18: qty 14

## 2013-03-18 MED ORDER — TRASTUZUMAB CHEMO INJECTION 440 MG
2.0000 mg/kg | Freq: Once | INTRAVENOUS | Status: AC
  Administered 2013-03-18: 147 mg via INTRAVENOUS
  Filled 2013-03-18: qty 7

## 2013-03-18 MED ORDER — SODIUM CHLORIDE 0.9 % IV SOLN
770.0000 mg | Freq: Once | INTRAVENOUS | Status: AC
  Administered 2013-03-18: 770 mg via INTRAVENOUS
  Filled 2013-03-18: qty 77

## 2013-03-18 MED ORDER — ONDANSETRON 16 MG/50ML IVPB (CHCC)
16.0000 mg | Freq: Once | INTRAVENOUS | Status: AC
  Administered 2013-03-18: 16 mg via INTRAVENOUS

## 2013-03-18 NOTE — Patient Instructions (Addendum)
Proceed with [facility]  We will see you back in 1 week for counts

## 2013-03-18 NOTE — Progress Notes (Signed)
OFFICE PROGRESS NOTE  CC  [name], MD [address] [place] [address] Dr. Emelia Loron Dr. Chipper Herb Dr. Starleen Arms Dr. Dorothy Puffer  DIAGNOSIS: 57 year old female who presented on 12/24/2012 which new diagnosis of breast cancer  PRIOR THERAPY:  #1 patient has multiple medical problems but recently she had a mammogram performed that showed a 5.4 cm area of calcifications. MRI of the breasts showed the area extended to about 7.2 cm. Needle core biopsy performed showed a high-grade DCIS that was ER positive PR positive.  #2 she was seen by Dr. Emelia Loron regarding surgical options. Patient wanted to have bilateral mastectomies. She went on to have this performed on 12/31/2012. The final pathology did reveal a 2.3 cm invasive ductal carcinoma in the left breast. The tumor was ER +100% PR +86% HER-2/neu was amplified with a ratio of 6.81. Ki-67 was elevated at 46%. She has had immediate reconstruction.  #3 patient is seen back in medical oncology for discussion of adjuvant treatment. Since patient is HER-2/neu positive recommendation is her 2-based therapy consisting of Taxotere carboplatinum and Herceptin. The Taxotere or and carboplatinum will be given every 3 weeks for 6 cycles. She will receive Neulasta on day 2. Herceptin will be given weekly with duration of chemotherapy and then every 3 weeks to finish out a one-year of treatment. Patient also understands that she will be placed on antiestrogen therapy with an aromatase inhibitor or tamoxifen.  CURRENT THERAPY: Cycle 3 of TCH  INTERVAL HISTORY: Patient returns in followup today prior to cycle #3 of TCH. Clinically she seems to be doing well. She seems to tolerate her chemotherapy well although the first week after the Taxotere and carboplatinum is pretty rough on her. Primarily due to her becoming neutropenic tired fatigued. All related to her chemotherapy. She otherwise has not had any nausea vomiting  fevers no chills night sweats. She has no peripheral paresthesias. Remainder of the 10 point review of systems is negative.    MEDICAL HISTORY: Past Medical History  Diagnosis Date  . IBS (irritable bowel syndrome)   . ADD (attention deficit disorder)   . Lyme disease   . Contact lens/glasses fitting     wears contacts or glasses  . Breast cancer 12/04/12    left, ER/PR +  . Allergy   . Asthma   . Thyroid disease   . Hypothyroidism     ALLERGIES:  is allergic to betadine; iodine; milk-related compounds; penicillins; shellfish-derived products; sulfamethoxazole w-trimethoprim; and cleocin.  MEDICATIONS:  Current Outpatient Prescriptions  Medication Sig Dispense Refill  . albuterol (PROVENTIL HFA;VENTOLIN HFA) 108 (90 BASE) MCG/ACT inhaler Inhale 2 puffs into the lungs every 6 (six) hours as needed.      . ciprofloxacin (CIPRO) 500 MG tablet Take 1 tablet (500 mg total) by mouth 2 (two) times daily.  14 tablet  5  . dexamethasone (DECADRON) 4 MG tablet Take 2 tablets (8 mg total) by mouth 2 (two) times daily with a meal. Take two times a day the day before Taxotere. Then take two times a day starting the day after chemo for 3 days.  30 tablet  1  . dextroamphetamine (DEXTROSTAT) 5 MG tablet Take 5 mg by mouth 3 (three) times daily.      . eszopiclone (LUNESTA) 2 MG TABS Take 2 mg by mouth at bedtime. Take immediately before bedtime      . famotidine (PEPCID) 20 MG tablet Take 20 mg by mouth as needed.      [name]  levothyroxine (SYNTHROID, LEVOTHROID) 100 MCG tablet Take 100 mcg by mouth daily.      [name] lidocaine-prilocaine (EMLA) cream Apply cream to PAC site 1-2 hours prior to procedure.  30 g  1  . LORazepam (ATIVAN) 0.5 MG tablet TAKE 1 TABLET BY MOUTH EVERY 6 HOURS AS NEEDED FOR NAUSEA OR VOMITING  30 tablet  0  . ondansetron (ZOFRAN ODT) 8 MG disintegrating tablet Take 1 tablet (8 mg total) by mouth every 8 (eight) hours as needed for nausea.  20 tablet  0  . ondansetron (ZOFRAN) 8 MG  tablet Take 1 tablet (8 mg total) by mouth 2 (two) times daily. Take two times a day starting the day after chemo for 3 days. Then take two times a day as needed for nausea or vomiting.  30 tablet  1  . prochlorperazine (COMPAZINE) 10 MG tablet Take 1 tablet (10 mg total) by mouth every 6 (six) hours as needed (Nausea or vomiting).  30 tablet  1  . prochlorperazine (COMPAZINE) 25 MG suppository Place 1 suppository (25 mg total) rectally every 12 (twelve) hours as needed for nausea.  12 suppository  3  . promethazine (PHENERGAN) 25 MG tablet Take 25 mg by mouth every 6 (six) hours as needed.      . sennosides-docusate sodium (SENOKOT-S) 8.6-50 MG tablet Take 1 tablet by mouth daily.       No current facility-administered medications for this visit.    SURGICAL HISTORY:  Past Surgical History  Procedure Laterality Date  . Brain surgery      removal benign frontal lobe cyst in 1987  . Foot surgery  2013    bilateral  . Gynecologic cryosurgery      cervical cancer, 1998 last remained disease free with recent normal pap  . Diagnostic laparoscopy  2004    lysis adh-uterine ablation  . Breast surgery      bilateral mastopexy  . Breast surgery      removal benign left breast lesion  . Abdominal hysterectomy  2005    TAH/BSO-lysis adh, enometriosis  . Face lift    . Axillary sentinel node biopsy  12/31/2012    Procedure: AXILLARY SENTINEL NODE BIOPSY;  Surgeon: Emelia Loron, MD;  Location: [facility];  Service: General;  Laterality: Bilateral;  bilateral sentinel node  . Total mastectomy  12/31/2012    Procedure: TOTAL MASTECTOMY;  Surgeon: Emelia Loron, MD;  Location: [facility];  Service: General;  Laterality: Bilateral;  bilateral total mastectomies  . Breast reconstruction with placement of tissue expander and flex hd (acellular hydrated dermis)  12/31/2012    Procedure: BREAST RECONSTRUCTION WITH PLACEMENT OF TISSUE EXPANDER AND FLEX HD (ACELLULAR  HYDRATED DERMIS);  Surgeon: Wayland Denis, DO;  Location: [facility];  Service: Plastics;  Laterality: Bilateral;  bilateral immediate breast reconstruction with expanders and flex hd   . Portacath placement  01/27/2013    Procedure: INSERTION PORT-A-CATH;  Surgeon: Emelia Loron, MD;  Location: [facility];  Service: General;  Laterality: Right;    REVIEW OF SYSTEMS:  General: fatigue (-), night sweats (-), fever (-), pain (-) Lymph: palpable nodes (-) HEENT: vision changes (-), mucositis (-), gum bleeding (-), epistaxis (-) Cardiovascular: chest pain (-), palpitations (-) Pulmonary: shortness of breath (-), dyspnea on exertion (-), cough (-), hemoptysis (-) GI:  Early satiety (-), melena (-), dysphagia (-), nausea/vomiting (-), diarrhea (-) GU: dysuria (-), hematuria (-), incontinence (-) Musculoskeletal: joint swelling (-), joint pain (-),  back pain (-) Neuro: weakness (-), numbness (-), headache (-), confusion (-) Skin: Rash (-), lesions (-), dryness (-) Psych: depression (-), suicidal/homicidal ideation (-), feeling of hopelessness (-)  PHYSICAL EXAMINATION: Blood pressure 114/74, pulse 91, temperature 97.7 F (36.5 C), temperature source Oral, resp. rate 20, height 5\' 5"  (1.651 m), weight 162 lb (73.483 kg). Body mass index is 26.96 kg/(m^2). ECOG PERFORMANCE STATUS: 1 - Symptomatic but completely ambulatory General: Patient is a well appearing female in no acute distress HEENT: PERRLA, sclerae anicteric no conjunctival pallor, MMM Neck: supple, no palpable adenopathy Lungs: clear to auscultation bilaterally, no wheezes, rhonchi, or rales Cardiovascular: regular rate rhythm, S1, S2, no murmurs, rubs or gallops Abdomen: Soft, non-tender, non-distended, normoactive bowel sounds, no HSM Extremities: warm and well perfused, no clubbing, cyanosis, or edema Skin: No rashes or lesions Neuro: Non-focal Breasts: She status post bilateral mastectomies with  expander is in place. The cosmetic result is symmetrical. The incisions are healing very nicely. Both axillae are benign.  LABORATORY DATA: Lab Results  Component Value Date   WBC 9.6 03/18/2013   HGB 10.9* 03/18/2013   HCT 32.8* 03/18/2013   MCV 84.3 03/18/2013   PLT 173 03/18/2013      Chemistry      Component Value Date/Time   NA 138 03/11/2013 0817   K 4.1 03/11/2013 0817   CL 105 03/11/2013 0817   CO2 26 03/11/2013 0817   BUN 21.8 03/11/2013 0817   CREATININE 0.6 03/11/2013 0817      Component Value Date/Time   CALCIUM 8.6 03/11/2013 0817   ALKPHOS 55 03/11/2013 0817   AST 25 03/11/2013 0817   ALT 33 03/11/2013 0817   BILITOT 0.25 03/11/2013 0817     12/31/2012 ADDITIONAL INFORMATION: 1. CHROMOGENIC IN-SITU HYBRIDIZATION Interpretation: HER2/NEU BY CISH - SHOWS AMPLIFICATION BY CISH ANALYSIS. THE RATIO OF HER2: CEP 17 SIGNALS WAS 6.81 Reference range: Ratio: HER2:CEP17 < 1.8 gene amplification not observed Ratio: HER2:CEP 17 1.8-2.2 - equivocal result Ratio: HER2:CEP17 > 2.2 - gene amplification observed Pecola Leisure MD Pathologist, Electronic Signature ( Signed 01/11/2013) 1. PROGNOSTIC INDICATORS - ACIS Results IMMUNOHISTOCHEMICAL AND MORPHOMETRIC ANALYSIS BY THE AUTOMATED CELLULAR IMAGING SYSTEM (ACIS) Estrogen Receptor (Negative, <1%): 99%, STRONG STAINING INTENSITY Progesterone Receptor (Negative, <1%): 10%, STRONG STAINING INTENSITY Proliferation Marker Ki67 by M IB-1 (Low<20%): 46% All controls stained appropriately Pecola Leisure MD Pathologist, Electronic Signature ( Signed 01/06/2013) 1 of 4 FINAL for HULL, Marla G [phone]) FINAL DIAGNOSIS Diagnosis 1. Breast, simple mastectomy, Left - INVASIVE DUCTAL CARCINOMA, GRADE II (2.3 CM), SEE COMMENT. - LYMPHOVASCULAR INVASION IDENTIFIED - INVASIVE TUMOR IS 3 CM FROM THE NEAREST MARGIN (DEEP). - HIGH GRADE DUCTAL CARCINOMA IN SITU WITH COMEDO NECROSIS AND CALCIFICATIONS. - SEE TUMOR SYNOPTIC TEMPLATE BELOW. 2. Lymph  node, sentinel, biopsy, Left axillary - ONE LYMPH NODE, NEGATIVE FOR TUMOR (0/1). 3. Breast, simple mastectomy, Right - BENIGN BREAST TISSUE, SEE COMMENT. - NEGATIVE FOR ATYPIA OR MALIGNANCY. 4. Lymph node, sentinel, biopsy, Right axillary - ONE LYMPH NODE, NEGATIVE FOR TUMOR (0/1). Microscopic Comment 1. BREAST, INVASIVE TUMOR, WITH LYMPH NODE SAMPLING Specimen, including laterality: Left breast Procedure: Simple mastectomy Grade: II of III Tubule formation: 3 Nuclear pleomorphism: 2 Mitotic: 1 Tumor size (gross measurement) 2.3 cm Margins: Invasive, distance to closest margin: 3.0 cm In-situ, distance to closest margin: 3.0 cm (deep) If margin positive, focally or broadly: N/A Lymphovascular invasion: Present, extensive Ductal carcinoma in situ: Present Grade: III of III Extensive intraductal component: Absent Lobular neoplasia: Absent Tumor focality:  Unifocal Treatment effect: None If present, treatment effect in breast tissue, lymph nodes or both: N/A Extent of tumor: Skin: Grossly negative for tumor Nipple: Grossly negative for tumor Skeletal muscle: Microscopically negative for tumor Lymph nodes: # examined: 1 Lymph nodes with metastasis: 0 Breast prognostic profile: Estrogen receptor: Repeated, previous study shows 100% positivity. [phone]) Progesterone receptor: Repeated, previous study demonstrated 86% positivity ([id number]) Her 2 neu: Pending and will be reported in an addendum. Ki-67: Pending and will be reported in an addendum. Non-neoplastic breast: No significant findings. TNM: pT2, pN0, pMX Comments: None. (CR:kh 01-04-13) 3. The entire 2.0 cm previous biopsy hematoma is submitted for histopathologic review. On review, there is 2 of 4 FINAL for HULL, Jeanifer G [phone]) Microscopic Comment(continued) no in situ or invasive carcinoma identified in any of the slide sections examined. Additional non-neoplastic findings include benign fibrocystic  change, pseudoangiomatous stromal hyperplasia, fibroadenomatoid nodules, periductal chronic inflammation with associated stromal fibrosis and microcalcifications in benign ducts and lobules. Pathologic Stage is pTX pN0, PMX. The surgical resection margin(s) of the specimen were inked and microscopically evaluated. [place] RU RADIOGRAPHIC STUDIES:  Mr Biopsy/wire Localization  12/25/2012  *RADIOLOGY REPORT*  Clinical Data:  6 mm enhancing mass in the upper right breast.  The patient has recent diagnosis of left breast DCIS.  MRI GUIDED VACUUM ASSISTED BIOPSY OF THE RIGHT BREAST WITHOUT AND WITH CONTRAST  Comparison: Previous exams.  Technique: Multiplanar, multisequence MR images of the right breast were obtained prior to and following the intravenous administration of 15 ml of Mulithance.  I met with the patient, and we discussed the procedure of MRI guided biopsy, including risks, benefits, and alternatives. Specifically, we discussed the risks of infection, bleeding, tissue injury, clip migration, and inadequate sampling.  Informed, written consent was given.  Using sterile technique, 2% Lidocaine, MRI guidance, and a 9 gauge vacuum assisted device, biopsy was performed of a 6 mm enhancing mass using a lateral to medial approach.  At the conclusion of the procedure, a tissue marker clip was deployed into the biopsy cavity.  IMPRESSION: MRI guided biopsy of the right breast. No apparent complications.  THREE-DIMENSIONAL MR IMAGE RENDERING ON INDEPENDENT WORKSTATION:  Three-dimensional MR images were rendered by post-processing of the original MR data on an independent workstation.  The three- dimensional MR images were interpreted, and findings were reported in the accompanying complete MRI report for this study.   Original Report Authenticated By: Britta Mccreedy, M.D.    Nm Sentinel Node Inj-no Rpt (breast)  12/31/2012  CLINICAL DATA: right axillary sentinel node biopsy   Sulfur colloid was injected  intradermally by the nuclear medicine  technologist for breast cancer sentinel node localization.     Nm Sentinel Node Inj-no Rpt (breast)  12/31/2012  CLINICAL DATA: left axillary sentinel node biopsy   Sulfur colloid was injected intradermally by the nuclear medicine  technologist for breast cancer sentinel node localization.     Mm Digital Diagnostic Unilat R  12/25/2012  *RADIOLOGY REPORT*  Clinical Data:  MRI guided core needle biopsy of a 6 mm nodule of enhancement in the upper right breast was performed.  DIGITAL DIAGNOSTIC RIGHT MAMMOGRAM  Comparison:  Previous exams.  Findings:  Films are performed following MRI guided biopsy of 6 mm enhancing nodule in the upper central right breast.  A dumbbell shaped biopsy clip is present in the upper right breast just medial to the level of the nipple.  The clip is within a biopsy cavity that contains air and a small  air-fluid level.  IMPRESSION: Satisfactory position of biopsy clip in the upper right breast.   Original Report Authenticated By: Britta Mccreedy, M.D.     ASSESSMENT: 57 year old [place] woman  (1) status post bilateral mastectomies in 12/31/2012 showing  (a) on the right, no evidence of malignancy  (b) on the left, a pT2 pN0, stage IIA invasive ductal carcinoma, grade 2, estrogen receptor 100% positive, progesterone receptor 86% positive, with an MIB-1 of 46%, and HER-2 amplification with a CISH ratio of 6.81  (2) starting adjuvant chemotherapy with carboplatin, docetaxel and trastuzumab February13 2014, to consist of 6 cycles.  #3 patient will proceed with cycle #3 of TCH today. Overall she is tolerating it well. She does develop neutropenia with dehydration and fatigue. We are supporting her through this.   PLAN:   #1 proceed with cycle #3 of scheduled chemotherapy.  #2 she'll return tomorrow for Neulasta injection.  #3 she will return in one week's time for Herceptin only.  All questions were answered. The patient knows to  call the clinic with any problems, questions or concerns. We can certainly see the patient much sooner if necessary.   I spent 25 minutes counseling the patient face to face. The total time spent in the appointment was 30 minutes.   Drue Second, MD Medical/Oncology [facility] [phone] (beeper) [phone] (Office)  03/18/2013, 8:49 AM

## 2013-03-18 NOTE — Patient Instructions (Addendum)
[facility] Discharge Instructions for Patients Receiving Chemotherapy  Today you received the following chemotherapy agents Taxotere, Carboplatin and Herceptin.  To help prevent nausea and vomiting after your treatment, we encourage you to take your nausea medication.   If you develop nausea and vomiting that is not controlled by your nausea medication, call the clinic.   BELOW ARE SYMPTOMS THAT SHOULD BE REPORTED IMMEDIATELY:  *FEVER GREATER THAN 100.5 F  *CHILLS WITH OR WITHOUT FEVER  NAUSEA AND VOMITING THAT IS NOT CONTROLLED WITH YOUR NAUSEA MEDICATION  *UNUSUAL SHORTNESS OF BREATH  *UNUSUAL BRUISING OR BLEEDING  TENDERNESS IN MOUTH AND THROAT WITH OR WITHOUT PRESENCE OF ULCERS  *URINARY PROBLEMS  *BOWEL PROBLEMS  UNUSUAL RASH Items with * indicate a potential emergency and should be followed up as soon as possible.  Feel free to call the clinic you have any questions or concerns. The clinic phone number is [phone].    

## 2013-03-19 ENCOUNTER — Ambulatory Visit (HOSPITAL_BASED_OUTPATIENT_CLINIC_OR_DEPARTMENT_OTHER): Payer: 59

## 2013-03-19 VITALS — BP 134/86 | HR 88 | Temp 98.1°F

## 2013-03-19 DIAGNOSIS — C50419 Malignant neoplasm of upper-outer quadrant of unspecified female breast: Secondary | ICD-10-CM

## 2013-03-19 DIAGNOSIS — Z5189 Encounter for other specified aftercare: Secondary | ICD-10-CM

## 2013-03-19 MED ORDER — PEGFILGRASTIM INJECTION 6 MG/0.6ML
6.0000 mg | Freq: Once | SUBCUTANEOUS | Status: AC
  Administered 2013-03-19: 6 mg via SUBCUTANEOUS
  Filled 2013-03-19: qty 0.6

## 2013-03-25 ENCOUNTER — Encounter: Payer: Self-pay | Admitting: Adult Health

## 2013-03-25 ENCOUNTER — Ambulatory Visit (HOSPITAL_BASED_OUTPATIENT_CLINIC_OR_DEPARTMENT_OTHER): Payer: 59 | Admitting: Adult Health

## 2013-03-25 ENCOUNTER — Ambulatory Visit (HOSPITAL_BASED_OUTPATIENT_CLINIC_OR_DEPARTMENT_OTHER): Payer: 59

## 2013-03-25 ENCOUNTER — Ambulatory Visit (HOSPITAL_COMMUNITY)
Admission: RE | Admit: 2013-03-25 | Discharge: 2013-03-25 | Disposition: A | Discharge status: Home / Self Care | Payer: 59 | Source: Ambulatory Visit | Attending: Internal Medicine | Admitting: Internal Medicine

## 2013-03-25 ENCOUNTER — Ambulatory Visit (HOSPITAL_COMMUNITY)
Admission: RE | Admit: 2013-03-25 | Discharge: 2013-03-25 | Disposition: A | Discharge status: Home / Self Care | Payer: 59 | Source: Ambulatory Visit | Attending: Endocrinology | Admitting: Endocrinology

## 2013-03-25 ENCOUNTER — Other Ambulatory Visit (HOSPITAL_BASED_OUTPATIENT_CLINIC_OR_DEPARTMENT_OTHER): Payer: 59 | Admitting: Lab

## 2013-03-25 VITALS — BP 112/68 | HR 84 | Wt 162.2 lb

## 2013-03-25 VITALS — BP 115/71 | HR 87 | Temp 98.2°F | Resp 17

## 2013-03-25 VITALS — BP 121/71 | HR 97 | Temp 97.0°F | Resp 20 | Ht 65.0 in | Wt 156.2 lb

## 2013-03-25 DIAGNOSIS — C50419 Malignant neoplasm of upper-outer quadrant of unspecified female breast: Secondary | ICD-10-CM

## 2013-03-25 DIAGNOSIS — Z5112 Encounter for antineoplastic immunotherapy: Secondary | ICD-10-CM

## 2013-03-25 DIAGNOSIS — C50919 Malignant neoplasm of unspecified site of unspecified female breast: Secondary | ICD-10-CM | POA: Insufficient documentation

## 2013-03-25 DIAGNOSIS — I369 Nonrheumatic tricuspid valve disorder, unspecified: Secondary | ICD-10-CM

## 2013-03-25 DIAGNOSIS — E860 Dehydration: Secondary | ICD-10-CM

## 2013-03-25 DIAGNOSIS — C50412 Malignant neoplasm of upper-outer quadrant of left female breast: Secondary | ICD-10-CM

## 2013-03-25 DIAGNOSIS — R110 Nausea: Secondary | ICD-10-CM

## 2013-03-25 LAB — COMPREHENSIVE METABOLIC PANEL (CC13)
Albumin: 3 g/dL — ABNORMAL LOW (ref 3.5–5.0)
BUN: 18.3 mg/dL (ref 7.0–26.0)
Calcium: 8.7 mg/dL (ref 8.4–10.4)
Chloride: 102 mEq/L (ref 98–107)
Glucose: 101 mg/dl — ABNORMAL HIGH (ref 70–99)
Potassium: 3.9 mEq/L (ref 3.5–5.1)

## 2013-03-25 LAB — CBC WITH DIFFERENTIAL/PLATELET
Basophils Absolute: 0 10*3/uL (ref 0.0–0.1)
EOS%: 0.5 % (ref 0.0–7.0)
HCT: 35.4 % (ref 34.8–46.6)
HGB: 11.9 g/dL (ref 11.6–15.9)
MCH: 28.7 pg (ref 25.1–34.0)
MONO#: 1.3 10*3/uL — ABNORMAL HIGH (ref 0.1–0.9)
NEUT%: 22.6 % — ABNORMAL LOW (ref 38.4–76.8)
lymph#: 1.9 10*3/uL (ref 0.9–3.3)

## 2013-03-25 MED ORDER — HEPARIN SOD (PORK) LOCK FLUSH 100 UNIT/ML IV SOLN
500.0000 [IU] | Freq: Once | INTRAVENOUS | Status: AC | PRN
  Administered 2013-03-25: 500 [IU]
  Filled 2013-03-25: qty 5

## 2013-03-25 MED ORDER — SODIUM CHLORIDE 0.9 % IJ SOLN
10.0000 mL | INTRAMUSCULAR | Status: DC | PRN
  Administered 2013-03-25: 10 mL
  Filled 2013-03-25: qty 10

## 2013-03-25 MED ORDER — TRASTUZUMAB CHEMO INJECTION 440 MG
2.0000 mg/kg | Freq: Once | INTRAVENOUS | Status: AC
  Administered 2013-03-25: 147 mg via INTRAVENOUS
  Filled 2013-03-25: qty 7

## 2013-03-25 MED ORDER — SODIUM CHLORIDE 0.9 % IV SOLN: Freq: Once | INTRAVENOUS | Status: DC

## 2013-03-25 MED ORDER — ONDANSETRON 8 MG PO TBDP: 8.0000 mg | ORAL_TABLET | Freq: Three times a day (TID) | ORAL | Status: DC | PRN

## 2013-03-25 MED ORDER — ACETAMINOPHEN 325 MG PO TABS
650.0000 mg | ORAL_TABLET | Freq: Once | ORAL | Status: AC
  Administered 2013-03-25: 650 mg via ORAL

## 2013-03-25 MED ORDER — SODIUM CHLORIDE 0.9 % IV SOLN
Freq: Once | INTRAVENOUS | Status: DC
  Administered 2013-03-25: 10:00:00 via INTRAVENOUS

## 2013-03-25 NOTE — Patient Instructions (Signed)
Doing well.  Proceed with Herceptin.     Patient Neutropenia Instruction Sheet  Diagnosis: Breast Cancer      Treating Physician: Drue Second, MD  Treatment: 1. Type of chemotherapy: TCH 2. Date of last treatment: 03/18/13  Last Blood Counts: Lab Results  Component Value Date   WBC 4.2 03/25/2013   HGB 11.9 03/25/2013   HCT 35.4 03/25/2013   MCV 85.5 03/25/2013   PLT 135* 03/25/2013   ANC 900     Prophylactic Antibiotics: Cipro 500 mg by mouth twice a day Instructions: 1. Monitor temperature and call if fever  greater than 100.5, chills, shaking chills (rigors) 2. Call Physician on-call at [phone] 3. Give him/her symptoms and list of medications that you are taking and your last blood count.

## 2013-03-25 NOTE — Progress Notes (Signed)
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 ER +100% PR +86% HER-2/neu (Sentinel nodes were negative for metastatic disease).  She is s/p bilateral mastectomies with reconstruction.    Plan for adjuvant chemotherapy and Herceptin.  On 02/04/13 she started Taxotere/carboplatinum to be given every 3 weeks for a total of 6 cycles. She will also receive Herceptin weekly for the duration of chemotherapy and then every 3 weeks to finish out one year.   She returns for routine follow up today.  She feels well.  She is tolerating chemotherapy at this time but doesn't like going weekly.  She is halfway through her 6 cycles.  She will move to q3 weekly herceptin in June.  She denies edema, orthopnea, PND or dyspnea.  She continues to work out 30-60 min daily.  She denies chest pain.  Continues to work full time in [employer].    Echos: 01/26/13 EF 60% lateral S' 10.9 Grade II diastolic dysfunction 03/25/13 EF 60-65% lat s' 12.8    Review of Systems: All pertinent positives and negatives as in HPI, otherwise negative.   Past Medical History  Diagnosis Date  . IBS (irritable bowel syndrome)   . ADD (attention deficit disorder)   . Lyme disease   . Contact lens/glasses fitting     wears contacts or glasses  . Breast cancer 12/04/12    left, ER/PR +  . Allergy   . Asthma   . Thyroid disease   . Hypothyroidism     Current Outpatient Prescriptions  Medication Sig Dispense Refill  . albuterol (PROVENTIL HFA;VENTOLIN HFA) 108 (90 BASE) MCG/ACT inhaler Inhale 2 puffs into the lungs every 6 (six) hours as needed.      . B Complex Vitamins (B COMPLEX PO) Take 1 each by mouth daily.      . ciprofloxacin (CIPRO) 500 MG tablet Take 1 tablet (500 mg total) by mouth 2 (two) times daily.  14 tablet  5  . dexamethasone (DECADRON) 4 MG tablet Take 2 tablets (8 mg total) by  mouth 2 (two) times daily with a meal. Take two times a day the day before Taxotere. Then take two times a day starting the day after chemo for 3 days.  30 tablet  1  . dextroamphetamine (DEXTROSTAT) 5 MG tablet Take 5 mg by mouth 3 (three) times daily.      . eszopiclone (LUNESTA) 2 MG TABS Take 2 mg by mouth at bedtime. Take immediately before bedtime      . famotidine (PEPCID) 20 MG tablet Take 20 mg by mouth as needed.      [name] levothyroxine (SYNTHROID, LEVOTHROID) 100 MCG tablet Take 100 mcg by mouth daily.      [name] lidocaine-prilocaine (EMLA) cream Apply cream to PAC site 1-2 hours prior to procedure.  30 g  1  . lidocaine-prilocaine (EMLA) cream Apply topically as needed.  30 g  8  . LORazepam (ATIVAN) 0.5 MG tablet TAKE 1 TABLET BY MOUTH EVERY 6 HOURS AS NEEDED FOR NAUSEA OR VOMITING  30 tablet  0  . LORazepam (ATIVAN) 1 MG tablet Take 1 tablet (1 mg total) by mouth every 8 (eight) hours.  90 tablet  0  . ondansetron (ZOFRAN ODT) 8 MG disintegrating tablet Take 1 tablet (8 mg total) by mouth every 8 (eight) hours as needed for nausea.  20 tablet  5  . ondansetron (ZOFRAN) 8 MG tablet Take 1 tablet (8 mg total) by mouth 2 (two) times daily. Take two times a day starting the day after chemo for 3 days. Then take two times a day as needed for nausea or vomiting.  30 tablet  1  . prochlorperazine (COMPAZINE) 10 MG tablet Take 1 tablet (10 mg total) by mouth every 6 (six) hours as needed (Nausea or vomiting).  30 tablet  1  . prochlorperazine (COMPAZINE) 25 MG suppository Place 1 suppository (25 mg total) rectally every 12 (twelve) hours as needed for nausea.  12 suppository  3  . promethazine (PHENERGAN) 25 MG tablet Take 25 mg by mouth every 6 (six) hours as needed.      . sennosides-docusate sodium (SENOKOT-S) 8.6-50 MG tablet Take 1 tablet by mouth daily.       No current facility-administered medications for this encounter.   Facility-Administered Medications Ordered in Other Encounters   Medication Dose Route Frequency Provider Last Rate Last Dose  . 0.9 %  sodium chloride infusion   Intravenous Once Victorino December, MD      . heparin lock flush 100 unit/mL  500 Units Intracatheter Once PRN Victorino December, MD      . sodium chloride 0.9 % injection 10 mL  10 mL Intracatheter PRN Victorino December, MD         Allergies  Allergen Reactions  . Betadine (Povidone Iodine)   . Iodine     REACTION: anaphylactic  . Milk-Related Compounds   . Penicillins   . Shellfish-Derived Products   . Sulfamethoxazole W-Trimethoprim   . Cleocin (Clindamycin Hcl) Rash     PHYSICAL EXAM: Filed Vitals:   03/25/13 1346  BP: 112/68  Pulse: 84   General:  Well appearing. No respiratory difficulty HEENT: normal Neck: supple.  JVP 5-6. Carotids 2+ bilat; no bruits. No lymphadenopathy or thryomegaly appreciated. Cor: PMI nondisplaced. Regular rate & rhythm. No rubs, gallops or murmurs. Lungs: clear Abdomen: soft, nontender, nondistended. No hepatosplenomegaly. No bruits or masses. Good bowel sounds. Extremities: no cyanosis, clubbing, rash, edema Neuro: alert & oriented x 3, cranial nerves grossly intact. moves all 4 extremities w/o difficulty. Affect pleasant.   ASSESSMENT & PLAN:

## 2013-03-25 NOTE — Progress Notes (Signed)
Pt requested to hold Benadryl premed for Herceptin. She is driving and it makes her drowsy. Reviewed with Lillia Abed, NP. Order received to HOLD Benadryl today.

## 2013-03-25 NOTE — Progress Notes (Signed)
OFFICE PROGRESS NOTE  CC  [name], MD [address] [place] [address] Dr. Emelia Loron Dr. Chipper Herb Dr. Starleen Arms Dr. Dorothy Puffer  DIAGNOSIS: 57 year old female who presented on 12/24/2012 which new diagnosis of breast cancer  PRIOR THERAPY:  #1 patient has multiple medical problems but recently she had a mammogram performed that showed a 5.4 cm area of calcifications. MRI of the breasts showed the area extended to about 7.2 cm. Needle core biopsy performed showed a high-grade DCIS that was ER positive PR positive.  #2 she was seen by Dr. Emelia Loron regarding surgical options. Patient wanted to have bilateral mastectomies. She went on to have this performed on 12/31/2012. The final pathology did reveal a 2.3 cm invasive ductal carcinoma in the left breast. The tumor was ER +100% PR +86% HER-2/neu was amplified with a ratio of 6.81. Ki-67 was elevated at 46%. She has had immediate reconstruction.  #3 patient is seen back in medical oncology for discussion of adjuvant treatment. Since patient is HER-2/neu positive recommendation is her 2-based therapy consisting of Taxotere carboplatinum and Herceptin. The Taxotere or and carboplatinum will be given every 3 weeks for 6 cycles. She will receive Neulasta on day 2. Herceptin will be given weekly with duration of chemotherapy and then every 3 weeks to finish out a one-year of treatment. Patient also understands that she will be placed on antiestrogen therapy with an aromatase inhibitor or tamoxifen.  CURRENT THERAPY: Cycle 3 day 8 of TCH, with weekly Herceptin  INTERVAL HISTORY: Patient returns in followup today prior to cycle #3 of TCH. Clinically she seems to be doing well. She is fatigued and c/o taste changes.  She did well with the Zofran ODT.  She has had diarrhea this week, as many as 12 episodes/day.  She did take Imodium and it has slowed down since then.  Otherwise, she denies vomiting, fevers, chills,  constipation, numbness, or any other concerns. She also tells me that her fiance has been put on hospice and he has approximately 6 months left to live.    MEDICAL HISTORY: Past Medical History  Diagnosis Date  . IBS (irritable bowel syndrome)   . ADD (attention deficit disorder)   . Lyme disease   . Contact lens/glasses fitting     wears contacts or glasses  . Breast cancer 12/04/12    left, ER/PR +  . Allergy   . Asthma   . Thyroid disease   . Hypothyroidism     ALLERGIES:  is allergic to betadine; iodine; milk-related compounds; penicillins; shellfish-derived products; sulfamethoxazole w-trimethoprim; and cleocin.  MEDICATIONS:  Current Outpatient Prescriptions  Medication Sig Dispense Refill  . albuterol (PROVENTIL HFA;VENTOLIN HFA) 108 (90 BASE) MCG/ACT inhaler Inhale 2 puffs into the lungs every 6 (six) hours as needed.      . B Complex Vitamins (B COMPLEX PO) Take 1 each by mouth daily.      . ciprofloxacin (CIPRO) 500 MG tablet Take 1 tablet (500 mg total) by mouth 2 (two) times daily.  14 tablet  5  . dexamethasone (DECADRON) 4 MG tablet Take 2 tablets (8 mg total) by mouth 2 (two) times daily with a meal. Take two times a day the day before Taxotere. Then take two times a day starting the day after chemo for 3 days.  30 tablet  1  . dextroamphetamine (DEXTROSTAT) 5 MG tablet Take 5 mg by mouth 3 (three) times daily.      . eszopiclone (LUNESTA) 2  MG TABS Take 2 mg by mouth at bedtime. Take immediately before bedtime      . famotidine (PEPCID) 20 MG tablet Take 20 mg by mouth as needed.      [name] levothyroxine (SYNTHROID, LEVOTHROID) 100 MCG tablet Take 100 mcg by mouth daily.      [name] lidocaine-prilocaine (EMLA) cream Apply cream to PAC site 1-2 hours prior to procedure.  30 g  1  . lidocaine-prilocaine (EMLA) cream Apply topically as needed.  30 g  8  . LORazepam (ATIVAN) 0.5 MG tablet TAKE 1 TABLET BY MOUTH EVERY 6 HOURS AS NEEDED FOR NAUSEA OR VOMITING  30 tablet  0  .  LORazepam (ATIVAN) 1 MG tablet Take 1 tablet (1 mg total) by mouth every 8 (eight) hours.  90 tablet  0  . ondansetron (ZOFRAN ODT) 8 MG disintegrating tablet Take 1 tablet (8 mg total) by mouth every 8 (eight) hours as needed for nausea.  20 tablet  0  . ondansetron (ZOFRAN) 8 MG tablet Take 1 tablet (8 mg total) by mouth 2 (two) times daily. Take two times a day starting the day after chemo for 3 days. Then take two times a day as needed for nausea or vomiting.  30 tablet  1  . prochlorperazine (COMPAZINE) 10 MG tablet Take 1 tablet (10 mg total) by mouth every 6 (six) hours as needed (Nausea or vomiting).  30 tablet  1  . prochlorperazine (COMPAZINE) 25 MG suppository Place 1 suppository (25 mg total) rectally every 12 (twelve) hours as needed for nausea.  12 suppository  3  . promethazine (PHENERGAN) 25 MG tablet Take 25 mg by mouth every 6 (six) hours as needed.      . sennosides-docusate sodium (SENOKOT-S) 8.6-50 MG tablet Take 1 tablet by mouth daily.       No current facility-administered medications for this visit.    SURGICAL HISTORY:  Past Surgical History  Procedure Laterality Date  . Brain surgery      removal benign frontal lobe cyst in 1987  . Foot surgery  2013    bilateral  . Gynecologic cryosurgery      cervical cancer, 1998 last remained disease free with recent normal pap  . Diagnostic laparoscopy  2004    lysis adh-uterine ablation  . Breast surgery      bilateral mastopexy  . Breast surgery      removal benign left breast lesion  . Abdominal hysterectomy  2005    TAH/BSO-lysis adh, enometriosis  . Face lift    . Axillary sentinel node biopsy  12/31/2012    Procedure: AXILLARY SENTINEL NODE BIOPSY;  Surgeon: Emelia Loron, MD;  Location: [facility];  Service: General;  Laterality: Bilateral;  bilateral sentinel node  . Total mastectomy  12/31/2012    Procedure: TOTAL MASTECTOMY;  Surgeon: Emelia Loron, MD;  Location: [facility];   Service: General;  Laterality: Bilateral;  bilateral total mastectomies  . Breast reconstruction with placement of tissue expander and flex hd (acellular hydrated dermis)  12/31/2012    Procedure: BREAST RECONSTRUCTION WITH PLACEMENT OF TISSUE EXPANDER AND FLEX HD (ACELLULAR HYDRATED DERMIS);  Surgeon: Wayland Denis, DO;  Location: [facility];  Service: Plastics;  Laterality: Bilateral;  bilateral immediate breast reconstruction with expanders and flex hd   . Portacath placement  01/27/2013    Procedure: INSERTION PORT-A-CATH;  Surgeon: Emelia Loron, MD;  Location: [facility];  Service: General;  Laterality: Right;  REVIEW OF SYSTEMS:  General: fatigue (+), night sweats (-), fever (-), pain (-) Lymph: palpable nodes (-) HEENT: vision changes (-), mucositis (-), gum bleeding (-), epistaxis (-) Cardiovascular: chest pain (-), palpitations (-) Pulmonary: shortness of breath (-), dyspnea on exertion (-), cough (-), hemoptysis (-) GI:  Early satiety (-), melena (-), dysphagia (-), nausea/vomiting (-), diarrhea (+) GU: dysuria (-), hematuria (-), incontinence (-) Musculoskeletal: joint swelling (-), joint pain (-), back pain (-) Neuro: weakness (-), numbness (-), headache (-), confusion (-) Skin: Rash (-), lesions (-), dryness (-) Psych: depression (-), suicidal/homicidal ideation (-), feeling of hopelessness (-)  PHYSICAL EXAMINATION: Blood pressure 121/71, pulse 97, temperature 97 F (36.1 C), temperature source Oral, resp. rate 20, height 5\' 5"  (1.651 m), weight 156 lb 3.2 oz (70.852 kg). Body mass index is 25.99 kg/(m^2). ECOG PERFORMANCE STATUS: 1 - Symptomatic but completely ambulatory General: Patient is a well appearing female in no acute distress HEENT: PERRLA, sclerae anicteric no conjunctival pallor, MMM Neck: supple, no palpable adenopathy Lungs: clear to auscultation bilaterally, no wheezes, rhonchi, or rales Cardiovascular: regular rate rhythm, S1,  S2, no murmurs, rubs or gallops Abdomen: Soft, non-tender, non-distended, normoactive bowel sounds, no HSM Extremities: warm and well perfused, no clubbing, cyanosis, or edema Skin: No rashes or lesions Neuro: Non-focal Breasts: She status post bilateral mastectomies with expander is in place. The cosmetic result is symmetrical. The incisions are healing very nicely. Both axillae are benign.  LABORATORY DATA: Lab Results  Component Value Date   WBC 4.2 03/25/2013   HGB 11.9 03/25/2013   HCT 35.4 03/25/2013   MCV 85.5 03/25/2013   PLT 135* 03/25/2013      Chemistry      Component Value Date/Time   NA 139 03/18/2013 0825   K 3.6 03/18/2013 0825   CL 105 03/18/2013 0825   CO2 26 03/18/2013 0825   BUN 23.1 03/18/2013 0825   CREATININE 0.6 03/18/2013 0825      Component Value Date/Time   CALCIUM 8.9 03/18/2013 0825   ALKPHOS 47 03/18/2013 0825   AST 23 03/18/2013 0825   ALT 27 03/18/2013 0825   BILITOT 0.35 03/18/2013 0825     12/31/2012 ADDITIONAL INFORMATION: 1. CHROMOGENIC IN-SITU HYBRIDIZATION Interpretation: HER2/NEU BY CISH - SHOWS AMPLIFICATION BY CISH ANALYSIS. THE RATIO OF HER2: CEP 17 SIGNALS WAS 6.81 Reference range: Ratio: HER2:CEP17 < 1.8 gene amplification not observed Ratio: HER2:CEP 17 1.8-2.2 - equivocal result Ratio: HER2:CEP17 > 2.2 - gene amplification observed Pecola Leisure MD Pathologist, Electronic Signature ( Signed 01/11/2013) 1. PROGNOSTIC INDICATORS - ACIS Results IMMUNOHISTOCHEMICAL AND MORPHOMETRIC ANALYSIS BY THE AUTOMATED CELLULAR IMAGING SYSTEM (ACIS) Estrogen Receptor (Negative, <1%): 99%, STRONG STAINING INTENSITY Progesterone Receptor (Negative, <1%): 10%, STRONG STAINING INTENSITY Proliferation Marker Ki67 by M IB-1 (Low<20%): 46% All controls stained appropriately Pecola Leisure MD Pathologist, Electronic Signature ( Signed 01/06/2013) 1 of 4 FINAL for HULL, Doreather G [phone]) FINAL DIAGNOSIS Diagnosis 1. Breast, simple mastectomy, Left - INVASIVE  DUCTAL CARCINOMA, GRADE II (2.3 CM), SEE COMMENT. - LYMPHOVASCULAR INVASION IDENTIFIED - INVASIVE TUMOR IS 3 CM FROM THE NEAREST MARGIN (DEEP). - HIGH GRADE DUCTAL CARCINOMA IN SITU WITH COMEDO NECROSIS AND CALCIFICATIONS. - SEE TUMOR SYNOPTIC TEMPLATE BELOW. 2. Lymph node, sentinel, biopsy, Left axillary - ONE LYMPH NODE, NEGATIVE FOR TUMOR (0/1). 3. Breast, simple mastectomy, Right - BENIGN BREAST TISSUE, SEE COMMENT. - NEGATIVE FOR ATYPIA OR MALIGNANCY. 4. Lymph node, sentinel, biopsy, Right axillary - ONE LYMPH NODE, NEGATIVE FOR TUMOR (0/1). Microscopic Comment 1. BREAST,  INVASIVE TUMOR, WITH LYMPH NODE SAMPLING Specimen, including laterality: Left breast Procedure: Simple mastectomy Grade: II of III Tubule formation: 3 Nuclear pleomorphism: 2 Mitotic: 1 Tumor size (gross measurement) 2.3 cm Margins: Invasive, distance to closest margin: 3.0 cm In-situ, distance to closest margin: 3.0 cm (deep) If margin positive, focally or broadly: N/A Lymphovascular invasion: Present, extensive Ductal carcinoma in situ: Present Grade: III of III Extensive intraductal component: Absent Lobular neoplasia: Absent Tumor focality: Unifocal Treatment effect: None If present, treatment effect in breast tissue, lymph nodes or both: N/A Extent of tumor: Skin: Grossly negative for tumor Nipple: Grossly negative for tumor Skeletal muscle: Microscopically negative for tumor Lymph nodes: # examined: 1 Lymph nodes with metastasis: 0 Breast prognostic profile: Estrogen receptor: Repeated, previous study shows 100% positivity. [id number]) Progesterone receptor: Repeated, previous study demonstrated 86% positivity ([id number]) Her 2 neu: Pending and will be reported in an addendum. Ki-67: Pending and will be reported in an addendum. Non-neoplastic breast: No significant findings. TNM: pT2, pN0, pMX Comments: None. (CR:kh 01-04-13) 3. The entire 2.0 cm previous biopsy hematoma is submitted for  histopathologic review. On review, there is 2 of 4 FINAL for HULL, Bentley G [phone]) Microscopic Comment(continued) no in situ or invasive carcinoma identified in any of the slide sections examined. Additional non-neoplastic findings include benign fibrocystic change, pseudoangiomatous stromal hyperplasia, fibroadenomatoid nodules, periductal chronic inflammation with associated stromal fibrosis and microcalcifications in benign ducts and lobules. Pathologic Stage is pTX pN0, PMX. The surgical resection margin(s) of the specimen were inked and microscopically evaluated. [place] RU RADIOGRAPHIC STUDIES:  Mr Biopsy/wire Localization  12/25/2012  *RADIOLOGY REPORT*  Clinical Data:  6 mm enhancing mass in the upper right breast.  The patient has recent diagnosis of left breast DCIS.  MRI GUIDED VACUUM ASSISTED BIOPSY OF THE RIGHT BREAST WITHOUT AND WITH CONTRAST  Comparison: Previous exams.  Technique: Multiplanar, multisequence MR images of the right breast were obtained prior to and following the intravenous administration of 15 ml of Mulithance.  I met with the patient, and we discussed the procedure of MRI guided biopsy, including risks, benefits, and alternatives. Specifically, we discussed the risks of infection, bleeding, tissue injury, clip migration, and inadequate sampling.  Informed, written consent was given.  Using sterile technique, 2% Lidocaine, MRI guidance, and a 9 gauge vacuum assisted device, biopsy was performed of a 6 mm enhancing mass using a lateral to medial approach.  At the conclusion of the procedure, a tissue marker clip was deployed into the biopsy cavity.  IMPRESSION: MRI guided biopsy of the right breast. No apparent complications.  THREE-DIMENSIONAL MR IMAGE RENDERING ON INDEPENDENT WORKSTATION:  Three-dimensional MR images were rendered by post-processing of the original MR data on an independent workstation.  The three- dimensional MR images were interpreted, and findings were  reported in the accompanying complete MRI report for this study.   Original Report Authenticated By: Britta Mccreedy, M.D.    Nm Sentinel Node Inj-no Rpt (breast)  12/31/2012  CLINICAL DATA: right axillary sentinel node biopsy   Sulfur colloid was injected intradermally by the nuclear medicine  technologist for breast cancer sentinel node localization.     Nm Sentinel Node Inj-no Rpt (breast)  12/31/2012  CLINICAL DATA: left axillary sentinel node biopsy   Sulfur colloid was injected intradermally by the nuclear medicine  technologist for breast cancer sentinel node localization.     Mm Digital Diagnostic Unilat R  12/25/2012  *RADIOLOGY REPORT*  Clinical Data:  MRI guided core needle biopsy of a 6  mm nodule of enhancement in the upper right breast was performed.  DIGITAL DIAGNOSTIC RIGHT MAMMOGRAM  Comparison:  Previous exams.  Findings:  Films are performed following MRI guided biopsy of 6 mm enhancing nodule in the upper central right breast.  A dumbbell shaped biopsy clip is present in the upper right breast just medial to the level of the nipple.  The clip is within a biopsy cavity that contains air and a small air-fluid level.  IMPRESSION: Satisfactory position of biopsy clip in the upper right breast.   Original Report Authenticated By: Britta Mccreedy, M.D.     ASSESSMENT: 57 year old [place] woman  (1) status post bilateral mastectomies in 12/31/2012 showing  (a) on the right, no evidence of malignancy  (b) on the left, a pT2 pN0, stage IIA invasive ductal carcinoma, grade 2, estrogen receptor 100% positive, progesterone receptor 86% positive, with an MIB-1 of 46%, and HER-2 amplification with a CISH ratio of 6.81  (2) starting adjuvant chemotherapy with carboplatin, docetaxel and trastuzumab February13 2014, to consist of 6 cycles.  #3 patient will proceed with cycle #3 of TCH today. Overall she is tolerating it well. She does develop neutropenia with dehydration and fatigue. We are  supporting her through this.   PLAN:   #1 proceed with weekly Herceptin.  Doing well.  We will give her IV fluids at her request today. She is neutropenic, we reviewed precautions and she will have her Cipro refilled.    #2 She will return in one week's time for Herceptin only.  All questions were answered. The patient knows to call the clinic with any problems, questions or concerns. We can certainly see the patient much sooner if necessary.   I spent 25 minutes counseling the patient face to face. The total time spent in the appointment was 30 minutes.   Cherie Ouch Lyn Hollingshead, NP Medical Oncology [facility] Phone: [phone] 03/25/2013, 8:56 AM

## 2013-03-25 NOTE — Assessment & Plan Note (Signed)
Have reviewed echo from today with the patient.  EF and lateral s' are ok.  Will continue current therapy.  Follow up in 2 months with repeat echo.  In June she will be moved to q3 weekly herceptin and then we will get repeat echos every 3 months.  Have discussed s/s of heart failure and she voices understanding.

## 2013-03-25 NOTE — Progress Notes (Signed)
  Echocardiogram 2D Echocardiogram has been performed.  Jane Gutierrez 03/25/2013, 1:46 PM

## 2013-03-31 ENCOUNTER — Telehealth: Payer: Self-pay | Admitting: Oncology

## 2013-03-31 NOTE — Telephone Encounter (Signed)
Due to [place] PAL 6/5 appts moved to 6/4 per [place]. tx will remain 6/5. S/w pt today re change and pt will get new schedule tomorrow.

## 2013-04-01 ENCOUNTER — Ambulatory Visit (HOSPITAL_BASED_OUTPATIENT_CLINIC_OR_DEPARTMENT_OTHER): Payer: 59

## 2013-04-01 ENCOUNTER — Other Ambulatory Visit (HOSPITAL_BASED_OUTPATIENT_CLINIC_OR_DEPARTMENT_OTHER): Payer: 59 | Admitting: Lab

## 2013-04-01 ENCOUNTER — Ambulatory Visit (HOSPITAL_BASED_OUTPATIENT_CLINIC_OR_DEPARTMENT_OTHER): Payer: 59 | Admitting: Adult Health

## 2013-04-01 DIAGNOSIS — R5381 Other malaise: Secondary | ICD-10-CM

## 2013-04-01 DIAGNOSIS — C50919 Malignant neoplasm of unspecified site of unspecified female breast: Secondary | ICD-10-CM

## 2013-04-01 DIAGNOSIS — C50419 Malignant neoplasm of upper-outer quadrant of unspecified female breast: Secondary | ICD-10-CM

## 2013-04-01 DIAGNOSIS — Z170 Estrogen receptor positive status [ER+]: Secondary | ICD-10-CM

## 2013-04-01 DIAGNOSIS — D0510 Intraductal carcinoma in situ of unspecified breast: Secondary | ICD-10-CM

## 2013-04-01 DIAGNOSIS — C801 Malignant (primary) neoplasm, unspecified: Secondary | ICD-10-CM

## 2013-04-01 DIAGNOSIS — Z5112 Encounter for antineoplastic immunotherapy: Secondary | ICD-10-CM

## 2013-04-01 LAB — COMPREHENSIVE METABOLIC PANEL (CC13)
AST: 24 U/L (ref 5–34)
Alkaline Phosphatase: 55 U/L (ref 40–150)
Glucose: 91 mg/dl (ref 70–99)
Potassium: 3.7 mEq/L (ref 3.5–5.1)
Sodium: 138 mEq/L (ref 136–145)
Total Bilirubin: 0.43 mg/dL (ref 0.20–1.20)
Total Protein: 6.3 g/dL — ABNORMAL LOW (ref 6.4–8.3)

## 2013-04-01 LAB — CBC WITH DIFFERENTIAL/PLATELET
BASO%: 0.2 % (ref 0.0–2.0)
EOS%: 0.1 % (ref 0.0–7.0)
HCT: 35 % (ref 34.8–46.6)
LYMPH%: 21.5 % (ref 14.0–49.7)
MCH: 28.4 pg (ref 25.1–34.0)
MCHC: 33.4 g/dL (ref 31.5–36.0)
MONO#: 1.2 10*3/uL — ABNORMAL HIGH (ref 0.1–0.9)
NEUT%: 69.7 % (ref 38.4–76.8)
Platelets: 147 10*3/uL (ref 145–400)

## 2013-04-01 MED ORDER — ACETAMINOPHEN 325 MG PO TABS
650.0000 mg | ORAL_TABLET | Freq: Once | ORAL | Status: AC
  Administered 2013-04-01: 650 mg via ORAL

## 2013-04-01 MED ORDER — TRASTUZUMAB CHEMO INJECTION 440 MG
2.0000 mg/kg | Freq: Once | INTRAVENOUS | Status: AC
  Administered 2013-04-01: 147 mg via INTRAVENOUS
  Filled 2013-04-01: qty 7

## 2013-04-01 MED ORDER — SODIUM CHLORIDE 0.9 % IV SOLN
Freq: Once | INTRAVENOUS | Status: AC
  Administered 2013-04-01: 10:00:00 via INTRAVENOUS

## 2013-04-01 MED ORDER — SODIUM CHLORIDE 0.9 % IJ SOLN
10.0000 mL | INTRAMUSCULAR | Status: DC | PRN
  Administered 2013-04-01: 10 mL
  Filled 2013-04-01: qty 10

## 2013-04-01 MED ORDER — HEPARIN SOD (PORK) LOCK FLUSH 100 UNIT/ML IV SOLN
500.0000 [IU] | Freq: Once | INTRAVENOUS | Status: AC | PRN
  Administered 2013-04-01: 500 [IU]
  Filled 2013-04-01: qty 5

## 2013-04-01 NOTE — Progress Notes (Signed)
[facility]  [phone]    OFFICE PROGRESS NOTE  CC  [name], MD [address] [place] [address] Dr. Emelia Loron Dr. Chipper Herb Dr. Starleen Arms Dr. Dorothy Puffer  DIAGNOSIS: 57 year old female who presented on 12/24/2012 which new diagnosis of breast cancer  PRIOR THERAPY:  #1 patient has multiple medical problems but recently she had a mammogram performed that showed a 5.4 cm area of calcifications. MRI of the breasts showed the area extended to about 7.2 cm. Needle core biopsy performed showed a high-grade DCIS that was ER positive PR positive.  #2 she was seen by Dr. Emelia Loron regarding surgical options. Patient wanted to have bilateral mastectomies. She went on to have this performed on 12/31/2012. The final pathology did reveal a 2.3 cm invasive ductal carcinoma in the left breast. The tumor was ER +100% PR +86% HER-2/neu was amplified with a ratio of 6.81. Ki-67 was elevated at 46%. She has had immediate reconstruction.  #3 patient is seen back in medical oncology for discussion of adjuvant treatment. Since patient is HER-2/neu positive recommendation is her 2-based therapy consisting of Taxotere carboplatinum and Herceptin. The Taxotere or and carboplatinum will be given every 3 weeks for 6 cycles. She will receive Neulasta on day 2. Herceptin will be given weekly with duration of chemotherapy and then every 3 weeks to finish out a one-year of treatment. Patient also understands that she will be placed on antiestrogen therapy with an aromatase inhibitor or tamoxifen.  CURRENT THERAPY: Cycle 4 day 1 of TCH, with weekly Herceptin  INTERVAL HISTORY: Patient returns in followup today prior to cycle #4 of TCH. Clinically she seems to be doing well. She is fatigued and c/o taste changes.  She did well with the Zofran ODT.  She denies diarrhea this week. She denies vomiting, fevers, chills, constipation, numbness, or any other  concerns.She has been exposed to a relative who had a respiratory infection but she is asymptomatic.    MEDICAL HISTORY: Past Medical History  Diagnosis Date  . IBS (irritable bowel syndrome)   . ADD (attention deficit disorder)   . Lyme disease   . Contact lens/glasses fitting     wears contacts or glasses  . Breast cancer 12/04/12    left, ER/PR +  . Allergy   . Asthma   . Thyroid disease   . Hypothyroidism     ALLERGIES:  is allergic to betadine; iodine; milk-related compounds; penicillins; shellfish-derived products; sulfamethoxazole w-trimethoprim; and cleocin.  MEDICATIONS:  Current Outpatient Prescriptions  Medication Sig Dispense Refill  . albuterol (PROVENTIL HFA;VENTOLIN HFA) 108 (90 BASE) MCG/ACT inhaler Inhale 2 puffs into the lungs every 6 (six) hours as needed.      . B Complex Vitamins (B COMPLEX PO) Take 1 each by mouth daily.      . ciprofloxacin (CIPRO) 500 MG tablet Take 1 tablet (500 mg total) by mouth 2 (two) times daily.  14 tablet  5  . dexamethasone (DECADRON) 4 MG tablet Take 2 tablets (8 mg total) by mouth 2 (two) times daily with a meal. Take two times a day the day before Taxotere. Then take two times a day starting the day after chemo for 3 days.  30 tablet  1  . dextroamphetamine (DEXTROSTAT) 5 MG tablet Take 5 mg by mouth 3 (three) times daily.      . eszopiclone (LUNESTA) 2 MG TABS Take 2 mg by mouth at bedtime. Take immediately before bedtime      .  famotidine (PEPCID) 20 MG tablet Take 20 mg by mouth as needed.      [name] levothyroxine (SYNTHROID, LEVOTHROID) 100 MCG tablet Take 100 mcg by mouth daily.      [name] lidocaine-prilocaine (EMLA) cream Apply cream to PAC site 1-2 hours prior to procedure.  30 g  1  . lidocaine-prilocaine (EMLA) cream Apply topically as needed.  30 g  8  . LORazepam (ATIVAN) 0.5 MG tablet TAKE 1 TABLET BY MOUTH EVERY 6 HOURS AS NEEDED FOR NAUSEA OR VOMITING  30 tablet  0  . LORazepam (ATIVAN) 1 MG tablet Take 1 tablet (1 mg total)  by mouth every 8 (eight) hours.  90 tablet  0  . ondansetron (ZOFRAN ODT) 8 MG disintegrating tablet Take 1 tablet (8 mg total) by mouth every 8 (eight) hours as needed for nausea.  20 tablet  5  . ondansetron (ZOFRAN) 8 MG tablet Take 1 tablet (8 mg total) by mouth 2 (two) times daily. Take two times a day starting the day after chemo for 3 days. Then take two times a day as needed for nausea or vomiting.  30 tablet  1  . prochlorperazine (COMPAZINE) 10 MG tablet Take 1 tablet (10 mg total) by mouth every 6 (six) hours as needed (Nausea or vomiting).  30 tablet  1  . prochlorperazine (COMPAZINE) 25 MG suppository Place 1 suppository (25 mg total) rectally every 12 (twelve) hours as needed for nausea.  12 suppository  3  . promethazine (PHENERGAN) 25 MG tablet Take 25 mg by mouth every 6 (six) hours as needed.      . sennosides-docusate sodium (SENOKOT-S) 8.6-50 MG tablet Take 1 tablet by mouth daily.       No current facility-administered medications for this visit.    SURGICAL HISTORY:  Past Surgical History  Procedure Laterality Date  . Brain surgery      removal benign frontal lobe cyst in 1987  . Foot surgery  2013    bilateral  . Gynecologic cryosurgery      cervical cancer, 1998 last remained disease free with recent normal pap  . Diagnostic laparoscopy  2004    lysis adh-uterine ablation  . Breast surgery      bilateral mastopexy  . Breast surgery      removal benign left breast lesion  . Abdominal hysterectomy  2005    TAH/BSO-lysis adh, enometriosis  . Face lift    . Axillary sentinel node biopsy  12/31/2012    Procedure: AXILLARY SENTINEL NODE BIOPSY;  Surgeon: Emelia Loron, MD;  Location: [facility];  Service: General;  Laterality: Bilateral;  bilateral sentinel node  . Total mastectomy  12/31/2012    Procedure: TOTAL MASTECTOMY;  Surgeon: Emelia Loron, MD;  Location: [facility];  Service: General;  Laterality: Bilateral;  bilateral  total mastectomies  . Breast reconstruction with placement of tissue expander and flex hd (acellular hydrated dermis)  12/31/2012    Procedure: BREAST RECONSTRUCTION WITH PLACEMENT OF TISSUE EXPANDER AND FLEX HD (ACELLULAR HYDRATED DERMIS);  Surgeon: Wayland Denis, DO;  Location: [facility];  Service: Plastics;  Laterality: Bilateral;  bilateral immediate breast reconstruction with expanders and flex hd   . Portacath placement  01/27/2013    Procedure: INSERTION PORT-A-CATH;  Surgeon: Emelia Loron, MD;  Location:  SURGERY CENTER;  Service: General;  Laterality: Right;    REVIEW OF SYSTEMS:  General: fatigue (+), night sweats (-), fever (-), pain (-) Lymph: palpable nodes (-)  HEENT: vision changes (-), mucositis (-), gum bleeding (-), epistaxis (-) Cardiovascular: chest pain (-), palpitations (-) Pulmonary: shortness of breath (-), dyspnea on exertion (-), cough (-), hemoptysis (-) GI:  Early satiety (-), melena (-), dysphagia (-), nausea/vomiting (-), diarrhea (-) GU: dysuria (-), hematuria (-), incontinence (-) Musculoskeletal: joint swelling (-), joint pain (-), back pain (-) Neuro: weakness (-), numbness (-), headache (-), confusion (-) Skin: Rash (-), lesions (-), dryness (-) Psych: depression (-), suicidal/homicidal ideation (-), feeling of hopelessness (-)  PHYSICAL EXAMINATION:  There were no vitals taken for this visit. There is no weight on file to calculate BMI.   ECOG PERFORMANCE STATUS: 1 - Symptomatic but completely ambulatory  General: Patient is a well appearing female in no acute distress HEENT: PERRLA, sclerae anicteric no conjunctival pallor, MMM Neck: supple, no palpable adenopathy Lungs: clear to auscultation bilaterally, no wheezes, rhonchi, or rales. Right port negative for discharge, tenderness or erythema. Cardiovascular: regular rate rhythm, S1, S2, no murmurs, rubs or gallops Abdomen: Soft, non-tender, non-distended, normoactive bowel  sounds, no HSM Extremities: warm and well perfused, no clubbing, cyanosis, or edema Skin: No rashes or lesions Neuro: Non-focal Breasts: She status post bilateral mastectomies with expander is in place. The cosmetic result is symmetrical. The incisions are healing very nicely. Both axillae are benign.  LABORATORY DATA: Lab Results  Component Value Date   WBC 14.3* 04/01/2013   HGB 11.7 04/01/2013   HCT 35.0 04/01/2013   MCV 85.0 04/01/2013   PLT 147 04/01/2013      Chemistry      Component Value Date/Time   NA 137 03/25/2013 0818   K 3.9 03/25/2013 0818   CL 102 03/25/2013 0818   CO2 27 03/25/2013 0818   BUN 18.3 03/25/2013 0818   CREATININE 0.6 03/25/2013 0818      Component Value Date/Time   CALCIUM 8.7 03/25/2013 0818   ALKPHOS 52 03/25/2013 0818   AST 22 03/25/2013 0818   ALT 36 03/25/2013 0818   BILITOT 0.34 03/25/2013 0818     12/31/2012 ADDITIONAL INFORMATION: 1. CHROMOGENIC IN-SITU HYBRIDIZATION Interpretation: HER2/NEU BY CISH - SHOWS AMPLIFICATION BY CISH ANALYSIS. THE RATIO OF HER2: CEP 17 SIGNALS WAS 6.81 Reference range: Ratio: HER2:CEP17 < 1.8 gene amplification not observed Ratio: HER2:CEP 17 1.8-2.2 - equivocal result Ratio: HER2:CEP17 > 2.2 - gene amplification observed Pecola Leisure MD Pathologist, Electronic Signature ( Signed 01/11/2013) 1. PROGNOSTIC INDICATORS - ACIS Results IMMUNOHISTOCHEMICAL AND MORPHOMETRIC ANALYSIS BY THE AUTOMATED CELLULAR IMAGING SYSTEM (ACIS) Estrogen Receptor (Negative, <1%): 99%, STRONG STAINING INTENSITY Progesterone Receptor (Negative, <1%): 10%, STRONG STAINING INTENSITY Proliferation Marker Ki67 by M IB-1 (Low<20%): 46% All controls stained appropriately Pecola Leisure MD Pathologist, Electronic Signature ( Signed 01/06/2013) 1 of 4 FINAL for HULL, Sarit G [phone]) FINAL DIAGNOSIS Diagnosis 1. Breast, simple mastectomy, Left - INVASIVE DUCTAL CARCINOMA, GRADE II (2.3 CM), SEE COMMENT. - LYMPHOVASCULAR INVASION IDENTIFIED - INVASIVE  TUMOR IS 3 CM FROM THE NEAREST MARGIN (DEEP). - HIGH GRADE DUCTAL CARCINOMA IN SITU WITH COMEDO NECROSIS AND CALCIFICATIONS. - SEE TUMOR SYNOPTIC TEMPLATE BELOW. 2. Lymph node, sentinel, biopsy, Left axillary - ONE LYMPH NODE, NEGATIVE FOR TUMOR (0/1). 3. Breast, simple mastectomy, Right - BENIGN BREAST TISSUE, SEE COMMENT. - NEGATIVE FOR ATYPIA OR MALIGNANCY. 4. Lymph node, sentinel, biopsy, Right axillary - ONE LYMPH NODE, NEGATIVE FOR TUMOR (0/1). Microscopic Comment 1. BREAST, INVASIVE TUMOR, WITH LYMPH NODE SAMPLING Specimen, including laterality: Left breast Procedure: Simple mastectomy Grade: II of III Tubule formation: 3 Nuclear pleomorphism:  2 Mitotic: 1 Tumor size (gross measurement) 2.3 cm Margins: Invasive, distance to closest margin: 3.0 cm In-situ, distance to closest margin: 3.0 cm (deep) If margin positive, focally or broadly: N/A Lymphovascular invasion: Present, extensive Ductal carcinoma in situ: Present Grade: III of III Extensive intraductal component: Absent Lobular neoplasia: Absent Tumor focality: Unifocal Treatment effect: None If present, treatment effect in breast tissue, lymph nodes or both: N/A Extent of tumor: Skin: Grossly negative for tumor Nipple: Grossly negative for tumor Skeletal muscle: Microscopically negative for tumor Lymph nodes: # examined: 1 Lymph nodes with metastasis: 0 Breast prognostic profile: Estrogen receptor: Repeated, previous study shows 100% positivity. [phone]) Progesterone receptor: Repeated, previous study demonstrated 86% positivity ([id number]) Her 2 neu: Pending and will be reported in an addendum. Ki-67: Pending and will be reported in an addendum. Non-neoplastic breast: No significant findings. TNM: pT2, pN0, pMX Comments: None. (CR:kh 01-04-13) 3. The entire 2.0 cm previous biopsy hematoma is submitted for histopathologic review. On review, there is 2 of 4 FINAL for HULL, [place] G  [phone]) Microscopic Comment(continued) no in situ or invasive carcinoma identified in any of the slide sections examined. Additional non-neoplastic findings include benign fibrocystic change, pseudoangiomatous stromal hyperplasia, fibroadenomatoid nodules, periductal chronic inflammation with associated stromal fibrosis and microcalcifications in benign ducts and lobules. Pathologic Stage is pTX pN0, PMX. The surgical resection margin(s) of the specimen were inked and microscopically evaluated. [place] RU RADIOGRAPHIC STUDIES:  Mr Biopsy/wire Localization  12/25/2012  *RADIOLOGY REPORT*  Clinical Data:  6 mm enhancing mass in the upper right breast.  The patient has recent diagnosis of left breast DCIS.  MRI GUIDED VACUUM ASSISTED BIOPSY OF THE RIGHT BREAST WITHOUT AND WITH CONTRAST  Comparison: Previous exams.  Technique: Multiplanar, multisequence MR images of the right breast were obtained prior to and following the intravenous administration of 15 ml of Mulithance.  I met with the patient, and we discussed the procedure of MRI guided biopsy, including risks, benefits, and alternatives. Specifically, we discussed the risks of infection, bleeding, tissue injury, clip migration, and inadequate sampling.  Informed, written consent was given.  Using sterile technique, 2% Lidocaine, MRI guidance, and a 9 gauge vacuum assisted device, biopsy was performed of a 6 mm enhancing mass using a lateral to medial approach.  At the conclusion of the procedure, a tissue marker clip was deployed into the biopsy cavity.  IMPRESSION: MRI guided biopsy of the right breast. No apparent complications.  THREE-DIMENSIONAL MR IMAGE RENDERING ON INDEPENDENT WORKSTATION:  Three-dimensional MR images were rendered by post-processing of the original MR data on an independent workstation.  The three- dimensional MR images were interpreted, and findings were reported in the accompanying complete MRI report for this study.   Original  Report Authenticated By: Britta Mccreedy, M.D.    Nm Sentinel Node Inj-no Rpt (breast)  12/31/2012  CLINICAL DATA: right axillary sentinel node biopsy   Sulfur colloid was injected intradermally by the nuclear medicine  technologist for breast cancer sentinel node localization.     Nm Sentinel Node Inj-no Rpt (breast)  12/31/2012  CLINICAL DATA: left axillary sentinel node biopsy   Sulfur colloid was injected intradermally by the nuclear medicine  technologist for breast cancer sentinel node localization.     Mm Digital Diagnostic Unilat R  12/25/2012  *RADIOLOGY REPORT*  Clinical Data:  MRI guided core needle biopsy of a 6 mm nodule of enhancement in the upper right breast was performed.  DIGITAL DIAGNOSTIC RIGHT MAMMOGRAM  Comparison:  Previous exams.  Findings:  Films are performed following MRI guided biopsy of 6 mm enhancing nodule in the upper central right breast.  A dumbbell shaped biopsy clip is present in the upper right breast just medial to the level of the nipple.  The clip is within a biopsy cavity that contains air and a small air-fluid level.  IMPRESSION: Satisfactory position of biopsy clip in the upper right breast.   Original Report Authenticated By: Britta Mccreedy, M.D.     ASSESSMENT: 57 year old [place] woman  (1) status post bilateral mastectomies in 12/31/2012 showing  (a) on the right, no evidence of malignancy  (b) on the left, a pT2 pN0, stage IIA invasive ductal carcinoma, grade 2, estrogen receptor 100% positive, progesterone receptor 86% positive, with an MIB-1 of 46%, and HER-2 amplification with a CISH ratio of 6.81  (2) starting adjuvant chemotherapy with carboplatin, docetaxel and trastuzumab February13 2014, to consist of 6 cycles.  #3 patient will proceed with cycle #4 of TCH today. Overall she is tolerating it well. She does develop neutropenia with dehydration and fatigue. We are supporting her through this.Her counts today are adequate.    PLAN:   #1 proceed  with weekly Herceptin.  Doing well.  We will give her IV fluids at her request today. She is not neutropenic today.  #2 She will return in one week's time for Herceptin only.She does not wish to have Benadryl as premed. She states that in prior chemo dose she did not have the medicine per request. No allergic reactions occurred. Thus, she will not receive Benadryl today.   All questions were answered. The patient knows to call the clinic with any problems, questions or concerns.She has an appointment for follow up and further [facility] with [facility]. We can certainly see the patient much sooner if necessary  Marlowe Kays, PA-C /Dr. Drue Second.

## 2013-04-01 NOTE — Patient Instructions (Signed)
Return as scheduled for follow up and further chemo

## 2013-04-01 NOTE — Patient Instructions (Addendum)
[facility] Discharge Instructions for Patients Receiving Chemotherapy  Today you received the following chemotherapy agents ; Herceptin.    To help prevent nausea and vomiting after your treatment, we encourage you to take your nausea medication as directed although Herceptin alone does not typically cause nausea.    If you develop nausea and vomiting that is not controlled by your nausea medication, call the clinic. If it is after clinic hours your family physician or the after hours number for the clinic or go to the Emergency Department.   BELOW ARE SYMPTOMS THAT SHOULD BE REPORTED IMMEDIATELY:  *FEVER GREATER THAN 100.5 F  *CHILLS WITH OR WITHOUT FEVER  NAUSEA AND VOMITING THAT IS NOT CONTROLLED WITH YOUR NAUSEA MEDICATION  *UNUSUAL SHORTNESS OF BREATH  *UNUSUAL BRUISING OR BLEEDING  TENDERNESS IN MOUTH AND THROAT WITH OR WITHOUT PRESENCE OF ULCERS  *URINARY PROBLEMS  *BOWEL PROBLEMS  UNUSUAL RASH Items with * indicate a potential emergency and should be followed up as soon as possible.  One of the nurses will contact you 24 hours after your treatment. Please let the nurse know about any problems that you may have experienced. Feel free to call the clinic you have any questions or concerns. The clinic phone number is [phone].   I have been informed and understand all the instructions given to me. I know to contact the clinic, my physician, or go to the Emergency Department if any problems should occur. I do not have any questions at this time, but understand that I may call the clinic during office hours   should I have any questions or need assistance in obtaining follow up care.    __________________________________________  _____________  __________ Signature of Patient or Authorized Representative            Date                   Time    __________________________________________ Nurse's Signature

## 2013-04-08 ENCOUNTER — Other Ambulatory Visit (HOSPITAL_BASED_OUTPATIENT_CLINIC_OR_DEPARTMENT_OTHER): Payer: 59 | Admitting: Lab

## 2013-04-08 ENCOUNTER — Encounter: Payer: Self-pay | Admitting: Adult Health

## 2013-04-08 ENCOUNTER — Ambulatory Visit (HOSPITAL_BASED_OUTPATIENT_CLINIC_OR_DEPARTMENT_OTHER): Payer: 59

## 2013-04-08 ENCOUNTER — Ambulatory Visit (HOSPITAL_BASED_OUTPATIENT_CLINIC_OR_DEPARTMENT_OTHER): Payer: 59 | Admitting: Adult Health

## 2013-04-08 VITALS — BP 120/73 | HR 88 | Temp 97.6°F | Resp 20 | Ht 65.0 in | Wt 159.8 lb

## 2013-04-08 DIAGNOSIS — Z5112 Encounter for antineoplastic immunotherapy: Secondary | ICD-10-CM

## 2013-04-08 DIAGNOSIS — C50419 Malignant neoplasm of upper-outer quadrant of unspecified female breast: Secondary | ICD-10-CM

## 2013-04-08 DIAGNOSIS — C50411 Malignant neoplasm of upper-outer quadrant of right female breast: Secondary | ICD-10-CM

## 2013-04-08 DIAGNOSIS — E860 Dehydration: Secondary | ICD-10-CM

## 2013-04-08 DIAGNOSIS — Z5111 Encounter for antineoplastic chemotherapy: Secondary | ICD-10-CM

## 2013-04-08 DIAGNOSIS — D709 Neutropenia, unspecified: Secondary | ICD-10-CM

## 2013-04-08 LAB — CBC WITH DIFFERENTIAL/PLATELET
Basophils Absolute: 0 10*3/uL (ref 0.0–0.1)
EOS%: 0 % (ref 0.0–7.0)
Eosinophils Absolute: 0 10*3/uL (ref 0.0–0.5)
HGB: 10.8 g/dL — ABNORMAL LOW (ref 11.6–15.9)
LYMPH%: 34.3 % (ref 14.0–49.7)
MCH: 28.9 pg (ref 25.1–34.0)
MCV: 86.6 fL (ref 79.5–101.0)
MONO%: 13.8 % (ref 0.0–14.0)
NEUT#: 4.2 10*3/uL (ref 1.5–6.5)
Platelets: 157 10*3/uL (ref 145–400)
RBC: 3.74 10*6/uL (ref 3.70–5.45)

## 2013-04-08 LAB — COMPREHENSIVE METABOLIC PANEL (CC13)
Alkaline Phosphatase: 48 U/L (ref 40–150)
BUN: 25.3 mg/dL (ref 7.0–26.0)
Glucose: 90 mg/dl (ref 70–99)
Total Bilirubin: 0.26 mg/dL (ref 0.20–1.20)

## 2013-04-08 MED ORDER — SODIUM CHLORIDE 0.9 % IV SOLN
Freq: Once | INTRAVENOUS | Status: AC
  Administered 2013-04-08: 10:00:00 via INTRAVENOUS

## 2013-04-08 MED ORDER — DOCETAXEL CHEMO INJECTION 160 MG/16ML
75.0000 mg/m2 | Freq: Once | INTRAVENOUS | Status: AC
  Administered 2013-04-08: 140 mg via INTRAVENOUS
  Filled 2013-04-08: qty 14

## 2013-04-08 MED ORDER — SODIUM CHLORIDE 0.9 % IV SOLN
770.0000 mg | Freq: Once | INTRAVENOUS | Status: AC
  Administered 2013-04-08: 770 mg via INTRAVENOUS
  Filled 2013-04-08: qty 77

## 2013-04-08 MED ORDER — ONDANSETRON 16 MG/50ML IVPB (CHCC)
16.0000 mg | Freq: Once | INTRAVENOUS | Status: AC
  Administered 2013-04-08: 16 mg via INTRAVENOUS

## 2013-04-08 MED ORDER — SODIUM CHLORIDE 0.9 % IJ SOLN
10.0000 mL | INTRAMUSCULAR | Status: DC | PRN
  Administered 2013-04-08: 10 mL
  Filled 2013-04-08: qty 10

## 2013-04-08 MED ORDER — TRASTUZUMAB CHEMO INJECTION 440 MG
2.0000 mg/kg | Freq: Once | INTRAVENOUS | Status: AC
  Administered 2013-04-08: 147 mg via INTRAVENOUS
  Filled 2013-04-08: qty 7

## 2013-04-08 MED ORDER — DEXAMETHASONE SODIUM PHOSPHATE 4 MG/ML IJ SOLN
20.0000 mg | Freq: Once | INTRAMUSCULAR | Status: AC
  Administered 2013-04-08: 20 mg via INTRAVENOUS

## 2013-04-08 MED ORDER — ACETAMINOPHEN 325 MG PO TABS
650.0000 mg | ORAL_TABLET | Freq: Once | ORAL | Status: AC
  Administered 2013-04-08: 650 mg via ORAL

## 2013-04-08 MED ORDER — HEPARIN SOD (PORK) LOCK FLUSH 100 UNIT/ML IV SOLN
500.0000 [IU] | Freq: Once | INTRAVENOUS | Status: AC | PRN
  Administered 2013-04-08: 500 [IU]
  Filled 2013-04-08: qty 5

## 2013-04-08 NOTE — Patient Instructions (Addendum)
[facility] Discharge Instructions for Patients Receiving Chemotherapy  Today you received the following chemotherapy agents: Taxotere,Carboplatin and Herceptin. To help prevent nausea and vomiting after your treatment, we encourage you to take your nausea medication as prescribed.   If you develop nausea and vomiting that is not controlled by your nausea medication, call the clinic.  BELOW ARE SYMPTOMS THAT SHOULD BE REPORTED IMMEDIATELY:  *FEVER GREATER THAN 100.5 F  *CHILLS WITH OR WITHOUT FEVER  NAUSEA AND VOMITING THAT IS NOT CONTROLLED WITH YOUR NAUSEA MEDICATION  *UNUSUAL SHORTNESS OF BREATH  *UNUSUAL BRUISING OR BLEEDING  TENDERNESS IN MOUTH AND THROAT WITH OR WITHOUT PRESENCE OF ULCERS  *URINARY PROBLEMS  *BOWEL PROBLEMS  UNUSUAL RASH Items with * indicate a potential emergency and should be followed up as soon as possible.  The clinic phone number is [phone].

## 2013-04-08 NOTE — Progress Notes (Signed)
[facility]  [phone]) [phone]    OFFICE PROGRESS NOTE  CC  [name], MD [address] [place] [address] Dr. Emelia Loron Dr. Chipper Herb Dr. Starleen Arms Dr. Dorothy Puffer  DIAGNOSIS: 57 year old female who presented on 12/24/2012 which new diagnosis of breast cancer  PRIOR THERAPY:  #1 patient has multiple medical problems but recently she had a mammogram performed that showed a 5.4 cm area of calcifications. MRI of the breasts showed the area extended to about 7.2 cm. Needle core biopsy performed showed a high-grade DCIS that was ER positive PR positive.  #2 she was seen by Dr. Emelia Loron regarding surgical options. Patient wanted to have bilateral mastectomies. She went on to have this performed on 12/31/2012. The final pathology did reveal a 2.3 cm invasive ductal carcinoma in the left breast. The tumor was ER +100% PR +86% HER-2/neu was amplified with a ratio of 6.81. Ki-67 was elevated at 46%. She has had immediate reconstruction.  #3 patient is seen back in medical oncology for discussion of adjuvant treatment. Since patient is HER-2/neu positive recommendation is her 2-based therapy consisting of Taxotere carboplatinum and Herceptin. The Taxotere or and carboplatinum will be given every 3 weeks for 6 cycles. She will receive Neulasta on day 2. Herceptin will be given weekly with duration of chemotherapy and then every 3 weeks to finish out a one-year of treatment. Patient also understands that she will be placed on antiestrogen therapy with an aromatase inhibitor or tamoxifen.  TCH was started on 02/04/13.  CURRENT THERAPY: Cycle 4 day 1 of TCH, with weekly Herceptin  INTERVAL HISTORY: Patient returns in followup today prior to cycle #4 of TCH. She is doing well today.  She has very mild intermittent numbness in her toes.  She is taking Super B complex daily.  She did develop vaginal candidiasis after being on cipro for her  neutropenia.  This has since resolved.  She also tells me she develops a rash after chemotherapy on her chest and mildly on her forehead that lasts about 3 days.  She has f/u on 4/29 with Dr. Kelly Splinter to discuss her reconstruction.  Otherwise, she denies fevers, chills, nausea, vomiting, shortness of breath, pain, constipation, diarrhea, or any further concerns.    MEDICAL HISTORY: Past Medical History  Diagnosis Date  . IBS (irritable bowel syndrome)   . ADD (attention deficit disorder)   . Lyme disease   . Contact lens/glasses fitting     wears contacts or glasses  . Breast cancer 12/04/12    left, ER/PR +  . Allergy   . Asthma   . Thyroid disease   . Hypothyroidism     ALLERGIES:  is allergic to betadine; iodine; milk-related compounds; penicillins; shellfish-derived products; sulfamethoxazole w-trimethoprim; and cleocin.  MEDICATIONS:  Current Outpatient Prescriptions  Medication Sig Dispense Refill  . albuterol (PROVENTIL HFA;VENTOLIN HFA) 108 (90 BASE) MCG/ACT inhaler Inhale 2 puffs into the lungs every 6 (six) hours as needed.      . B Complex Vitamins (B COMPLEX PO) Take 1 each by mouth daily.      [name] dexamethasone (DECADRON) 4 MG tablet Take 2 tablets (8 mg total) by mouth 2 (two) times daily with a meal. Take two times a day the day before Taxotere. Then take two times a day starting the day after chemo for 3 days.  30 tablet  1  . dextroamphetamine (DEXTROSTAT) 5 MG tablet Take 5 mg by mouth 3 (three) times daily.      [name]  eszopiclone (LUNESTA) 2 MG TABS Take 2 mg by mouth at bedtime. Take immediately before bedtime      . famotidine (PEPCID) 20 MG tablet Take 20 mg by mouth as needed.      [name] levothyroxine (SYNTHROID, LEVOTHROID) 100 MCG tablet Take 100 mcg by mouth daily.      [name] lidocaine-prilocaine (EMLA) cream Apply cream to PAC site 1-2 hours prior to procedure.  30 g  1  . lidocaine-prilocaine (EMLA) cream Apply topically as needed.  30 g  8  . LORazepam (ATIVAN) 0.5 MG  tablet TAKE 1 TABLET BY MOUTH EVERY 6 HOURS AS NEEDED FOR NAUSEA OR VOMITING  30 tablet  0  . LORazepam (ATIVAN) 1 MG tablet Take 1 tablet (1 mg total) by mouth every 8 (eight) hours.  90 tablet  0  . ondansetron (ZOFRAN ODT) 8 MG disintegrating tablet Take 1 tablet (8 mg total) by mouth every 8 (eight) hours as needed for nausea.  20 tablet  5  . ondansetron (ZOFRAN) 8 MG tablet Take 1 tablet (8 mg total) by mouth 2 (two) times daily. Take two times a day starting the day after chemo for 3 days. Then take two times a day as needed for nausea or vomiting.  30 tablet  1  . prochlorperazine (COMPAZINE) 10 MG tablet Take 1 tablet (10 mg total) by mouth every 6 (six) hours as needed (Nausea or vomiting).  30 tablet  1  . prochlorperazine (COMPAZINE) 25 MG suppository Place 1 suppository (25 mg total) rectally every 12 (twelve) hours as needed for nausea.  12 suppository  3  . promethazine (PHENERGAN) 25 MG tablet Take 25 mg by mouth every 6 (six) hours as needed.      . sennosides-docusate sodium (SENOKOT-S) 8.6-50 MG tablet Take 1 tablet by mouth daily.       No current facility-administered medications for this visit.    SURGICAL HISTORY:  Past Surgical History  Procedure Laterality Date  . Brain surgery      removal benign frontal lobe cyst in 1987  . Foot surgery  2013    bilateral  . Gynecologic cryosurgery      cervical cancer, 1998 last remained disease free with recent normal pap  . Diagnostic laparoscopy  2004    lysis adh-uterine ablation  . Breast surgery      bilateral mastopexy  . Breast surgery      removal benign left breast lesion  . Abdominal hysterectomy  2005    TAH/BSO-lysis adh, enometriosis  . Face lift    . Axillary sentinel node biopsy  12/31/2012    Procedure: AXILLARY SENTINEL NODE BIOPSY;  Surgeon: Emelia Loron, MD;  Location: [facility];  Service: General;  Laterality: Bilateral;  bilateral sentinel node  . Total mastectomy  12/31/2012     Procedure: TOTAL MASTECTOMY;  Surgeon: Emelia Loron, MD;  Location: [facility];  Service: General;  Laterality: Bilateral;  bilateral total mastectomies  . Breast reconstruction with placement of tissue expander and flex hd (acellular hydrated dermis)  12/31/2012    Procedure: BREAST RECONSTRUCTION WITH PLACEMENT OF TISSUE EXPANDER AND FLEX HD (ACELLULAR HYDRATED DERMIS);  Surgeon: Wayland Denis, DO;  Location: [facility];  Service: Plastics;  Laterality: Bilateral;  bilateral immediate breast reconstruction with expanders and flex hd   . Portacath placement  01/27/2013    Procedure: INSERTION PORT-A-CATH;  Surgeon: Emelia Loron, MD;  Location: [facility];  Service: General;  Laterality:  Right;    REVIEW OF SYSTEMS:  General: fatigue (+), night sweats (-), fever (-), pain (-) Lymph: palpable nodes (-) HEENT: vision changes (-), mucositis (-), gum bleeding (-), epistaxis (-) Cardiovascular: chest pain (-), palpitations (-) Pulmonary: shortness of breath (-), dyspnea on exertion (-), cough (-), hemoptysis (-) GI:  Early satiety (-), melena (-), dysphagia (-), nausea/vomiting (-), diarrhea (-) GU: dysuria (-), hematuria (-), incontinence (-) Musculoskeletal: joint swelling (-), joint pain (-), back pain (-) Neuro: weakness (-), numbness (-), headache (-), confusion (-) Skin: Rash (-), lesions (-), dryness (-) Psych: depression (-), suicidal/homicidal ideation (-), feeling of hopelessness (-)  PHYSICAL EXAMINATION:  Blood pressure 120/73, pulse 88, temperature 97.6 F (36.4 C), temperature source Oral, resp. rate 20, height 5\' 5"  (1.651 m), weight 159 lb 12.8 oz (72.485 kg). Body mass index is 26.59 kg/(m^2).   ECOG PERFORMANCE STATUS: 1 - Symptomatic but completely ambulatory  General: Patient is a well appearing female in no acute distress HEENT: PERRLA, sclerae anicteric no conjunctival pallor, MMM Neck: supple, no palpable  adenopathy Lungs: clear to auscultation bilaterally, no wheezes, rhonchi, or rales. Right port negative for discharge, tenderness or erythema. Cardiovascular: regular rate rhythm, S1, S2, no murmurs, rubs or gallops Abdomen: Soft, non-tender, non-distended, normoactive bowel sounds, no HSM Extremities: warm and well perfused, no clubbing, cyanosis, or edema Skin: No rashes or lesions Neuro: Non-focal Breasts: She status post bilateral mastectomies with expander is in place. The cosmetic result is symmetrical. The incisions are healing very nicely. Both axillae are benign.  LABORATORY DATA: Lab Results  Component Value Date   WBC 8.2 04/08/2013   HGB 10.8* 04/08/2013   HCT 32.4* 04/08/2013   MCV 86.6 04/08/2013   PLT 157 04/08/2013      Chemistry      Component Value Date/Time   NA 138 04/01/2013 0850   K 3.7 04/01/2013 0850   CL 103 04/01/2013 0850   CO2 26 04/01/2013 0850   BUN 15.2 04/01/2013 0850   CREATININE 0.6 04/01/2013 0850      Component Value Date/Time   CALCIUM 8.8 04/01/2013 0850   ALKPHOS 55 04/01/2013 0850   AST 24 04/01/2013 0850   ALT 28 04/01/2013 0850   BILITOT 0.43 04/01/2013 0850     12/31/2012 ADDITIONAL INFORMATION: 1. CHROMOGENIC IN-SITU HYBRIDIZATION Interpretation: HER2/NEU BY CISH - SHOWS AMPLIFICATION BY CISH ANALYSIS. THE RATIO OF HER2: CEP 17 SIGNALS WAS 6.81 Reference range: Ratio: HER2:CEP17 < 1.8 gene amplification not observed Ratio: HER2:CEP 17 1.8-2.2 - equivocal result Ratio: HER2:CEP17 > 2.2 - gene amplification observed Pecola Leisure MD Pathologist, Electronic Signature ( Signed 01/11/2013) 1. PROGNOSTIC INDICATORS - ACIS Results IMMUNOHISTOCHEMICAL AND MORPHOMETRIC ANALYSIS BY THE AUTOMATED CELLULAR IMAGING SYSTEM (ACIS) Estrogen Receptor (Negative, <1%): 99%, STRONG STAINING INTENSITY Progesterone Receptor (Negative, <1%): 10%, STRONG STAINING INTENSITY Proliferation Marker Ki67 by M IB-1 (Low<20%): 46% All controls stained  appropriately Pecola Leisure MD Pathologist, Electronic Signature ( Signed 01/06/2013) 1 of 4 FINAL for HULL, Kamila G [phone]) FINAL DIAGNOSIS Diagnosis 1. Breast, simple mastectomy, Left - INVASIVE DUCTAL CARCINOMA, GRADE II (2.3 CM), SEE COMMENT. - LYMPHOVASCULAR INVASION IDENTIFIED - INVASIVE TUMOR IS 3 CM FROM THE NEAREST MARGIN (DEEP). - HIGH GRADE DUCTAL CARCINOMA IN SITU WITH COMEDO NECROSIS AND CALCIFICATIONS. - SEE TUMOR SYNOPTIC TEMPLATE BELOW. 2. Lymph node, sentinel, biopsy, Left axillary - ONE LYMPH NODE, NEGATIVE FOR TUMOR (0/1). 3. Breast, simple mastectomy, Right - BENIGN BREAST TISSUE, SEE COMMENT. - NEGATIVE FOR ATYPIA OR MALIGNANCY. 4. Lymph node,  sentinel, biopsy, Right axillary - ONE LYMPH NODE, NEGATIVE FOR TUMOR (0/1). Microscopic Comment 1. BREAST, INVASIVE TUMOR, WITH LYMPH NODE SAMPLING Specimen, including laterality: Left breast Procedure: Simple mastectomy Grade: II of III Tubule formation: 3 Nuclear pleomorphism: 2 Mitotic: 1 Tumor size (gross measurement) 2.3 cm Margins: Invasive, distance to closest margin: 3.0 cm In-situ, distance to closest margin: 3.0 cm (deep) If margin positive, focally or broadly: N/A Lymphovascular invasion: Present, extensive Ductal carcinoma in situ: Present Grade: III of III Extensive intraductal component: Absent Lobular neoplasia: Absent Tumor focality: Unifocal Treatment effect: None If present, treatment effect in breast tissue, lymph nodes or both: N/A Extent of tumor: Skin: Grossly negative for tumor Nipple: Grossly negative for tumor Skeletal muscle: Microscopically negative for tumor Lymph nodes: # examined: 1 Lymph nodes with metastasis: 0 Breast prognostic profile: Estrogen receptor: Repeated, previous study shows 100% positivity. [phone]) Progesterone receptor: Repeated, previous study demonstrated 86% positivity ([id number]) Her 2 neu: Pending and will be reported in an addendum. Ki-67:  Pending and will be reported in an addendum. Non-neoplastic breast: No significant findings. TNM: pT2, pN0, pMX Comments: None. (CR:kh 01-04-13) 3. The entire 2.0 cm previous biopsy hematoma is submitted for histopathologic review. On review, there is 2 of 4 FINAL for HULL, Paytin G [phone]) Microscopic Comment(continued) no in situ or invasive carcinoma identified in any of the slide sections examined. Additional non-neoplastic findings include benign fibrocystic change, pseudoangiomatous stromal hyperplasia, fibroadenomatoid nodules, periductal chronic inflammation with associated stromal fibrosis and microcalcifications in benign ducts and lobules. Pathologic Stage is pTX pN0, PMX. The surgical resection margin(s) of the specimen were inked and microscopically evaluated. [place] RU RADIOGRAPHIC STUDIES:  Mr Biopsy/wire Localization  12/25/2012  *RADIOLOGY REPORT*  Clinical Data:  6 mm enhancing mass in the upper right breast.  The patient has recent diagnosis of left breast DCIS.  MRI GUIDED VACUUM ASSISTED BIOPSY OF THE RIGHT BREAST WITHOUT AND WITH CONTRAST  Comparison: Previous exams.  Technique: Multiplanar, multisequence MR images of the right breast were obtained prior to and following the intravenous administration of 15 ml of Mulithance.  I met with the patient, and we discussed the procedure of MRI guided biopsy, including risks, benefits, and alternatives. Specifically, we discussed the risks of infection, bleeding, tissue injury, clip migration, and inadequate sampling.  Informed, written consent was given.  Using sterile technique, 2% Lidocaine, MRI guidance, and a 9 gauge vacuum assisted device, biopsy was performed of a 6 mm enhancing mass using a lateral to medial approach.  At the conclusion of the procedure, a tissue marker clip was deployed into the biopsy cavity.  IMPRESSION: MRI guided biopsy of the right breast. No apparent complications.  THREE-DIMENSIONAL MR IMAGE RENDERING ON  INDEPENDENT WORKSTATION:  Three-dimensional MR images were rendered by post-processing of the original MR data on an independent workstation.  The three- dimensional MR images were interpreted, and findings were reported in the accompanying complete MRI report for this study.   Original Report Authenticated By: Britta Mccreedy, M.D.    Nm Sentinel Node Inj-no Rpt (breast)  12/31/2012  CLINICAL DATA: right axillary sentinel node biopsy   Sulfur colloid was injected intradermally by the nuclear medicine  technologist for breast cancer sentinel node localization.     Nm Sentinel Node Inj-no Rpt (breast)  12/31/2012  CLINICAL DATA: left axillary sentinel node biopsy   Sulfur colloid was injected intradermally by the nuclear medicine  technologist for breast cancer sentinel node localization.     Mm Digital Diagnostic Unilat R  12/25/2012  *RADIOLOGY REPORT*  Clinical Data:  MRI guided core needle biopsy of a 6 mm nodule of enhancement in the upper right breast was performed.  DIGITAL DIAGNOSTIC RIGHT MAMMOGRAM  Comparison:  Previous exams.  Findings:  Films are performed following MRI guided biopsy of 6 mm enhancing nodule in the upper central right breast.  A dumbbell shaped biopsy clip is present in the upper right breast just medial to the level of the nipple.  The clip is within a biopsy cavity that contains air and a small air-fluid level.  IMPRESSION: Satisfactory position of biopsy clip in the upper right breast.   Original Report Authenticated By: Britta Mccreedy, M.D.     ASSESSMENT: 57 year old [place] woman  (1) status post bilateral mastectomies in 12/31/2012 showing  (a) on the right, no evidence of malignancy  (b) on the left, a pT2 pN0, stage IIA invasive ductal carcinoma, grade 2, estrogen receptor 100% positive, progesterone receptor 86% positive, with an MIB-1 of 46%, and HER-2 amplification with a CISH ratio of 6.81  (2) starting adjuvant chemotherapy with carboplatin, docetaxel and  trastuzumab February13 2014, to consist of 6 cycles.  #3 patient will proceed with cycle #4 of TCH today. Overall she is tolerating it well. She does develop neutropenia with dehydration and fatigue. We are supporting her through this.Her counts today are adequate.    PLAN:   #1 Proceed with chemotherapy.  We will evaluate her skin next week and make determinations regarding future chemotherapy.   #2 She will return in one week's time for Herceptin only. She does not wish to have Benadryl as premed. She states that in prior chemo dose she did not have the medicine per request. No allergic reactions occurred. Thus, she will not receive Benadryl today.   #3 She has previously had vaginal candidiasis as a result of being on Cipro.  In the future we will prescribe Fluconazole with her antibiotics.    All questions were answered. The patient knows to call the clinic with any problems, questions or concerns.She has an appointment for follow up and [facility]. We can certainly see the patient much sooner if necessary  I spent 15 minutes counseling the patient face to face.  The total time spent in the appointment was 30 minutes.   Cherie Ouch Lyn Hollingshead, NP Medical Oncology [facility] Phone: [phone]

## 2013-04-08 NOTE — Patient Instructions (Addendum)
Doing well.  Proceed with chemotherapy.  Please call us if you have any questions or concerns.    

## 2013-04-09 ENCOUNTER — Ambulatory Visit (HOSPITAL_BASED_OUTPATIENT_CLINIC_OR_DEPARTMENT_OTHER): Payer: 59

## 2013-04-09 VITALS — BP 123/68 | HR 88 | Temp 98.4°F

## 2013-04-09 DIAGNOSIS — D709 Neutropenia, unspecified: Secondary | ICD-10-CM

## 2013-04-09 DIAGNOSIS — C50419 Malignant neoplasm of upper-outer quadrant of unspecified female breast: Secondary | ICD-10-CM

## 2013-04-09 MED ORDER — PEGFILGRASTIM INJECTION 6 MG/0.6ML
6.0000 mg | Freq: Once | SUBCUTANEOUS | Status: AC
  Administered 2013-04-09: 6 mg via SUBCUTANEOUS
  Filled 2013-04-09: qty 0.6

## 2013-04-13 ENCOUNTER — Inpatient Hospital Stay (HOSPITAL_COMMUNITY)
Admission: EM | Admit: 2013-04-13 | Discharge: 2013-04-15 | DRG: 872 | Disposition: A | Discharge status: Home / Self Care | Payer: 59 | Attending: Internal Medicine | Admitting: Internal Medicine

## 2013-04-13 ENCOUNTER — Emergency Department (HOSPITAL_COMMUNITY): Payer: 59

## 2013-04-13 ENCOUNTER — Encounter (HOSPITAL_COMMUNITY): Payer: Self-pay | Admitting: *Deleted

## 2013-04-13 ENCOUNTER — Other Ambulatory Visit: Payer: Self-pay | Admitting: *Deleted

## 2013-04-13 DIAGNOSIS — E079 Disorder of thyroid, unspecified: Secondary | ICD-10-CM

## 2013-04-13 DIAGNOSIS — R6510 Systemic inflammatory response syndrome (SIRS) of non-infectious origin without acute organ dysfunction: Secondary | ICD-10-CM | POA: Diagnosis present

## 2013-04-13 DIAGNOSIS — J020 Streptococcal pharyngitis: Secondary | ICD-10-CM | POA: Diagnosis present

## 2013-04-13 DIAGNOSIS — D0510 Intraductal carcinoma in situ of unspecified breast: Secondary | ICD-10-CM

## 2013-04-13 DIAGNOSIS — C801 Malignant (primary) neoplasm, unspecified: Secondary | ICD-10-CM

## 2013-04-13 DIAGNOSIS — E871 Hypo-osmolality and hyponatremia: Secondary | ICD-10-CM | POA: Diagnosis present

## 2013-04-13 DIAGNOSIS — A6920 Lyme disease, unspecified: Secondary | ICD-10-CM

## 2013-04-13 DIAGNOSIS — K589 Irritable bowel syndrome without diarrhea: Secondary | ICD-10-CM | POA: Diagnosis present

## 2013-04-13 DIAGNOSIS — Z79899 Other long term (current) drug therapy: Secondary | ICD-10-CM

## 2013-04-13 DIAGNOSIS — L659 Nonscarring hair loss, unspecified: Secondary | ICD-10-CM | POA: Diagnosis present

## 2013-04-13 DIAGNOSIS — D630 Anemia in neoplastic disease: Secondary | ICD-10-CM | POA: Diagnosis present

## 2013-04-13 DIAGNOSIS — N61 Mastitis without abscess: Secondary | ICD-10-CM | POA: Diagnosis present

## 2013-04-13 DIAGNOSIS — D649 Anemia, unspecified: Secondary | ICD-10-CM | POA: Diagnosis present

## 2013-04-13 DIAGNOSIS — A419 Sepsis, unspecified organism: Secondary | ICD-10-CM | POA: Diagnosis present

## 2013-04-13 DIAGNOSIS — F988 Other specified behavioral and emotional disorders with onset usually occurring in childhood and adolescence: Secondary | ICD-10-CM | POA: Diagnosis present

## 2013-04-13 DIAGNOSIS — A409 Streptococcal sepsis, unspecified: Principal | ICD-10-CM | POA: Diagnosis present

## 2013-04-13 DIAGNOSIS — E860 Dehydration: Secondary | ICD-10-CM | POA: Diagnosis present

## 2013-04-13 DIAGNOSIS — R509 Fever, unspecified: Secondary | ICD-10-CM | POA: Diagnosis present

## 2013-04-13 DIAGNOSIS — C50419 Malignant neoplasm of upper-outer quadrant of unspecified female breast: Secondary | ICD-10-CM

## 2013-04-13 DIAGNOSIS — T7840XA Allergy, unspecified, initial encounter: Secondary | ICD-10-CM

## 2013-04-13 DIAGNOSIS — Z9010 Acquired absence of unspecified breast and nipple: Secondary | ICD-10-CM

## 2013-04-13 DIAGNOSIS — Z460 Encounter for fitting and adjustment of spectacles and contact lenses: Secondary | ICD-10-CM

## 2013-04-13 DIAGNOSIS — J45909 Unspecified asthma, uncomplicated: Secondary | ICD-10-CM | POA: Diagnosis present

## 2013-04-13 DIAGNOSIS — C50912 Malignant neoplasm of unspecified site of left female breast: Secondary | ICD-10-CM | POA: Diagnosis present

## 2013-04-13 DIAGNOSIS — C50919 Malignant neoplasm of unspecified site of unspecified female breast: Secondary | ICD-10-CM | POA: Diagnosis present

## 2013-04-13 DIAGNOSIS — E039 Hypothyroidism, unspecified: Secondary | ICD-10-CM | POA: Diagnosis present

## 2013-04-13 LAB — CBC WITH DIFFERENTIAL/PLATELET
Basophils Absolute: 0.1 10*3/uL (ref 0.0–0.1)
Eosinophils Relative: 1 % (ref 0–5)
MCH: 29.4 pg (ref 26.0–34.0)
Monocytes Absolute: 0.1 10*3/uL (ref 0.1–1.0)
Neutrophils Relative %: 76 % (ref 43–77)
Platelets: 153 10*3/uL (ref 150–400)
RBC: 3.54 MIL/uL — ABNORMAL LOW (ref 3.87–5.11)
WBC: 4.2 10*3/uL (ref 4.0–10.5)

## 2013-04-13 LAB — URINALYSIS, ROUTINE W REFLEX MICROSCOPIC
Glucose, UA: NEGATIVE mg/dL
Leukocytes, UA: NEGATIVE
Nitrite: NEGATIVE
Specific Gravity, Urine: 1.027 (ref 1.005–1.030)
pH: 6.5 (ref 5.0–8.0)

## 2013-04-13 LAB — COMPREHENSIVE METABOLIC PANEL
Albumin: 3.2 g/dL — ABNORMAL LOW (ref 3.5–5.2)
Alkaline Phosphatase: 50 U/L (ref 39–117)
BUN: 20 mg/dL (ref 6–23)
Calcium: 8.9 mg/dL (ref 8.4–10.5)
Creatinine, Ser: 0.5 mg/dL (ref 0.50–1.10)
Potassium: 3.7 mEq/L (ref 3.5–5.1)
Total Protein: 6 g/dL (ref 6.0–8.3)

## 2013-04-13 MED ORDER — ACETAMINOPHEN 325 MG PO TABS
650.0000 mg | ORAL_TABLET | Freq: Once | ORAL | Status: AC
  Administered 2013-04-13: 650 mg via ORAL
  Filled 2013-04-13: qty 2

## 2013-04-13 MED ORDER — ONDANSETRON HCL 4 MG/2ML IJ SOLN
4.0000 mg | Freq: Once | INTRAMUSCULAR | Status: AC
  Administered 2013-04-13: 4 mg via INTRAVENOUS
  Filled 2013-04-13: qty 2

## 2013-04-13 MED ORDER — PROMETHAZINE HCL 25 MG PO TABS: ORAL_TABLET | ORAL | Status: DC

## 2013-04-13 NOTE — ED Notes (Signed)
Bedside report received from previous RN, Codey Burling. 

## 2013-04-13 NOTE — ED Provider Notes (Signed)
History     CSN: [id number]  Arrival date & time 04/13/13  1850   First MD Initiated Contact with Patient 04/13/13 1903      Chief Complaint  Patient presents with  . Fever    (Consider location/radiation/quality/duration/timing/severity/associated sxs/prior treatment) HPI Comments: Patient reports onset of shaking chills this evening after coming home from work. Patient had a temperature of 101.4. She is currently being treated for breast cancer, has had 3 courses of chemotherapy, last dose April 17. Patient was told to come to the ER for blood work to make sure she was not neutropenic. She has not had any symptoms to explain the fever. She denies sore throat, sinus congestion and shortness of breath. She has had a slight dry cough yesterday, no cough today. There is no chest pain. She has not had any abdominal pain. She has mild nausea which is consistent with her chemotherapy, no change today. No diarrhea. Patient does report that she has a small scab on the left breast area where she has an expander. This has been present since the original placement. It occasionally drainage, but there has been no change.  Patient is a 57 y.o. female presenting with fever.  Fever Associated symptoms: cough and nausea     Past Medical History  Diagnosis Date  . IBS (irritable bowel syndrome)   . ADD (attention deficit disorder)   . Lyme disease   . Contact lens/glasses fitting     wears contacts or glasses  . Breast cancer 12/04/12    left, ER/PR +  . Allergy   . Asthma   . Thyroid disease   . Hypothyroidism     Past Surgical History  Procedure Laterality Date  . Brain surgery      removal benign frontal lobe cyst in 1987  . Foot surgery  2013    bilateral  . Gynecologic cryosurgery      cervical cancer, 1998 last remained disease free with recent normal pap  . Diagnostic laparoscopy  2004    lysis adh-uterine ablation  . Breast surgery      bilateral mastopexy  . Breast surgery       removal benign left breast lesion  . Abdominal hysterectomy  2005    TAH/BSO-lysis adh, enometriosis  . Face lift    . Axillary sentinel node biopsy  12/31/2012    Procedure: AXILLARY SENTINEL NODE BIOPSY;  Surgeon: Emelia Loron, MD;  Location: [facility];  Service: General;  Laterality: Bilateral;  bilateral sentinel node  . Total mastectomy  12/31/2012    Procedure: TOTAL MASTECTOMY;  Surgeon: Emelia Loron, MD;  Location: [facility];  Service: General;  Laterality: Bilateral;  bilateral total mastectomies  . Breast reconstruction with placement of tissue expander and flex hd (acellular hydrated dermis)  12/31/2012    Procedure: BREAST RECONSTRUCTION WITH PLACEMENT OF TISSUE EXPANDER AND FLEX HD (ACELLULAR HYDRATED DERMIS);  Surgeon: Wayland Denis, DO;  Location: [facility];  Service: Plastics;  Laterality: Bilateral;  bilateral immediate breast reconstruction with expanders and flex hd   . Portacath placement  01/27/2013    Procedure: INSERTION PORT-A-CATH;  Surgeon: Emelia Loron, MD;  Location: [facility];  Service: General;  Laterality: Right;    Family History  Problem Relation Age of Onset  . Cancer Mother 9    uterine, mult myeloma  . CVA Father   . Cancer Sister 71    breast  . Cancer Maternal Grandmother 58  breast    History  Substance Use Topics  . Smoking status: Never Smoker   . Smokeless tobacco: Not on file  . Alcohol Use: Yes     Comment: socially, 2-3 times per year    OB History   Grav Para Term Preterm Abortions TAB SAB Ect Mult Living                  Review of Systems  Constitutional: Positive for fever.  HENT: Negative for neck pain and neck stiffness.   Respiratory: Positive for cough.   Gastrointestinal: Positive for nausea.  All other systems reviewed and are negative.    Allergies  Betadine; Iodine; Milk-related compounds; Penicillins; Shellfish-derived products;  Sulfamethoxazole w-trimethoprim; and Cleocin  Home Medications   Current Outpatient Rx  Name  Route  Sig  Dispense  Refill  . albuterol (PROVENTIL HFA;VENTOLIN HFA) 108 (90 BASE) MCG/ACT inhaler   Inhalation   Inhale 2 puffs into the lungs every 6 (six) hours as needed.         . B Complex Vitamins (B COMPLEX PO)   Oral   Take 1 each by mouth daily.         [name] dexamethasone (DECADRON) 4 MG tablet   Oral   Take 2 tablets (8 mg total) by mouth 2 (two) times daily with a meal. Take two times a day the day before Taxotere. Then take two times a day starting the day after chemo for 3 days.   30 tablet   1   . dextroamphetamine (DEXTROSTAT) 5 MG tablet   Oral   Take 5 mg by mouth 3 (three) times daily.         . eszopiclone (LUNESTA) 2 MG TABS   Oral   Take 2 mg by mouth at bedtime. Take immediately before bedtime         . levothyroxine (SYNTHROID, LEVOTHROID) 100 MCG tablet   Oral   Take 100 mcg by mouth daily.         [name] LORazepam (ATIVAN) 1 MG tablet   Oral   Take 1 tablet (1 mg total) by mouth every 8 (eight) hours.   90 tablet   0   . ondansetron (ZOFRAN ODT) 8 MG disintegrating tablet   Oral   Take 1 tablet (8 mg total) by mouth every 8 (eight) hours as needed for nausea.   20 tablet   5   . prochlorperazine (COMPAZINE) 10 MG tablet   Oral   Take 1 tablet (10 mg total) by mouth every 6 (six) hours as needed (Nausea or vomiting).   30 tablet   1   . prochlorperazine (COMPAZINE) 25 MG suppository   Rectal   Place 1 suppository (25 mg total) rectally every 12 (twelve) hours as needed for nausea.   12 suppository   3   . promethazine (PHENERGAN) 25 MG tablet      1/2(12.5mg ) - 1tablet(25mg ) every 6 hours PRN nausea.   30 tablet   0   . sennosides-docusate sodium (SENOKOT-S) 8.6-50 MG tablet   Oral   Take 1 tablet by mouth daily.           BP 132/75  Pulse 143  Temp(Src) 98.9 F (37.2 C) (Oral)  Resp 18  SpO2 99%  Physical Exam   Constitutional: She is oriented to person, place, and time. She appears well-developed and well-nourished. No distress.  HENT:  Head: Normocephalic and atraumatic.  Right Ear: Hearing normal.  Nose: Nose  normal.  Mouth/Throat: Oropharynx is clear and moist and mucous membranes are normal.  Eyes: Conjunctivae and EOM are normal. Pupils are equal, round, and reactive to light.  Neck: Normal range of motion. Neck supple.  Cardiovascular: Normal rate, regular rhythm, S1 normal and S2 normal.  Exam reveals no gallop and no friction rub.   No murmur heard. Pulmonary/Chest: Effort normal and breath sounds normal. No respiratory distress. She exhibits no tenderness.  Abdominal: Soft. Normal appearance and bowel sounds are normal. There is no hepatosplenomegaly. There is no tenderness. There is no rebound, no guarding, no tenderness at McBurney's point and negative Murphy's sign. No hernia.  Musculoskeletal: Normal range of motion.  Neurological: She is alert and oriented to person, place, and time. She has normal strength. No cranial nerve deficit or sensory deficit. Coordination normal. GCS eye subscore is 4. GCS verbal subscore is 5. GCS motor subscore is 6.  Skin: Skin is warm, dry and intact. No cyanosis.  1 cm circular ulcerated area inferior portion of left breast reconstruction region. Mild surrounding erythema without induration or fluctuance.  Psychiatric: She has a normal mood and affect. Her speech is normal and behavior is normal. Thought content normal.    ED Course  Procedures (including critical care time)  Labs Reviewed  CBC WITH DIFFERENTIAL - Abnormal; Notable for the following:    RBC 3.54 (*)    Hemoglobin 10.4 (*)    HCT 30.3 (*)    RDW 17.3 (*)    Monocytes Relative 2 (*)    Basophils Relative 2 (*)    All other components within normal limits  COMPREHENSIVE METABOLIC PANEL - Abnormal; Notable for the following:    Sodium 131 (*)    Chloride 95 (*)    Glucose, Bld  102 (*)    Albumin 3.2 (*)    ALT 36 (*)    All other components within normal limits  URINE CULTURE  CULTURE, BLOOD (ROUTINE X 2)  CULTURE, BLOOD (ROUTINE X 2)  URINALYSIS, ROUTINE W REFLEX MICROSCOPIC  POCT LACTIC ACID (LACTATE)   Dg Chest 2 View  04/13/2013  *RADIOLOGY REPORT*  Clinical Data: Fever.  Breast cancer.  CHEST - 2 VIEW  Comparison: 01/27/2013 and 10/25/2012  Findings: Power port appears in good position.  Bilateral breast implants for reconstruction.  The heart size and vascularity are normal.  Lungs are clear.  No osseous abnormality.  IMPRESSION: No acute disease.   Original Report Authenticated By: Francene Boyers, M.D.      Diagnosis: Fever, Hypotension    MDM  Patient presents to the ER for evaluation of fever. Patient is currently being treated for breast cancer with chemotherapy. Her doctor sent her to the ER because of concern for possible neutropenia. Patient did have a fever of 103 here in the ER. No clear source has been found. She does have an ulcerated area on the left breast region underneath her implant expander, but this has been present chronically. Is no sign of abscess, but this could be a source of the fever. The lead to cover for skin infection based on this part of the examination.   Chest x-ray was clear. Urinalysis was clear. Lab work was essentially normal. Patient has a normal WBC of 4.2 and no neutropenia.  Patient was hypertensive at arrival, systolic blood pressure in the 90s. She does not appear to be septic, and it was felt that it might be hypotensive secondary to poor by mouth intake, nausea and vomiting. The symptoms  were secondary to chemotherapy. Blood pressure has come up into the 100s systolic after 2 L of normal saline solution. Patient is still dizzy upon ambulation and tachycardic. He does with this, patient will be hospitalized for further management. Discussed with Dr. Felipa Eth, [facility]. He felt that the  patient's presentation is related to her oncologic issues and therefore recommended keeping her out with saline and having the hospitalist admit. Discussed with Dr. Waymon Amato, will admit. Initiate cefepime and vancomycin empirically.    Gilda Crease, MD 04/14/13 [phone]

## 2013-04-13 NOTE — ED Notes (Signed)
Patient is aware that we need a urine sample.    

## 2013-04-13 NOTE — ED Notes (Signed)
Family leaving, request contact if needed, [phone], Jane Gutierrez

## 2013-04-13 NOTE — ED Notes (Signed)
Pt reports being treated with chemo for breast cancer, last chemo 4/17. Pt reports febrile 101.4. pcp recommended pt come to ed to be evaluated.

## 2013-04-13 NOTE — ED Notes (Signed)
[id number]<AV> Expected date:<BR> Expected time:<BR> Means of arrival:<BR> Comments:<BR> Hold for triage 3

## 2013-04-14 ENCOUNTER — Inpatient Hospital Stay (HOSPITAL_COMMUNITY): Payer: 59

## 2013-04-14 ENCOUNTER — Encounter (HOSPITAL_COMMUNITY): Payer: Self-pay | Admitting: Internal Medicine

## 2013-04-14 DIAGNOSIS — E860 Dehydration: Secondary | ICD-10-CM

## 2013-04-14 DIAGNOSIS — R509 Fever, unspecified: Secondary | ICD-10-CM | POA: Diagnosis present

## 2013-04-14 DIAGNOSIS — J45909 Unspecified asthma, uncomplicated: Secondary | ICD-10-CM

## 2013-04-14 DIAGNOSIS — Z170 Estrogen receptor positive status [ER+]: Secondary | ICD-10-CM

## 2013-04-14 DIAGNOSIS — D649 Anemia, unspecified: Secondary | ICD-10-CM | POA: Diagnosis present

## 2013-04-14 DIAGNOSIS — C50419 Malignant neoplasm of upper-outer quadrant of unspecified female breast: Secondary | ICD-10-CM

## 2013-04-14 DIAGNOSIS — R6510 Systemic inflammatory response syndrome (SIRS) of non-infectious origin without acute organ dysfunction: Secondary | ICD-10-CM | POA: Diagnosis present

## 2013-04-14 DIAGNOSIS — N61 Mastitis without abscess: Secondary | ICD-10-CM

## 2013-04-14 LAB — CG4 I-STAT (LACTIC ACID): Lactic Acid, Venous: 1.64 mmol/L (ref 0.5–2.2)

## 2013-04-14 LAB — BASIC METABOLIC PANEL
BUN: 13 mg/dL (ref 6–23)
GFR calc non Af Amer: >90 mL/min (ref >90)
Glucose, Bld: 100 mg/dL — ABNORMAL HIGH (ref 70–99)
Potassium: 3.9 mEq/L (ref 3.5–5.1)

## 2013-04-14 LAB — CBC
Hemoglobin: 9.7 g/dL — ABNORMAL LOW (ref 12.0–15.0)
MCH: 29.7 pg (ref 26.0–34.0)
MCHC: 34.4 g/dL (ref 30.0–36.0)
MCV: 86.2 fL (ref 78.0–100.0)

## 2013-04-14 LAB — MRSA PCR SCREENING: MRSA by PCR: NEGATIVE

## 2013-04-14 LAB — RAPID STREP SCREEN (MED CTR MEBANE ONLY): Streptococcus, Group A Screen (Direct): POSITIVE — AB

## 2013-04-14 LAB — LEGIONELLA ANTIGEN, URINE

## 2013-04-14 LAB — INFLUENZA PANEL BY PCR (TYPE A & B): H1N1 flu by pcr: NOT DETECTED

## 2013-04-14 MED ORDER — EPINEPHRINE 0.3 MG/0.3ML IJ DEVI
0.3000 mg | INTRAMUSCULAR | Status: DC | PRN
  Filled 2013-04-14: qty 0.6

## 2013-04-14 MED ORDER — DEXTROSE 5 % IV SOLN
2.0000 g | Freq: Three times a day (TID) | INTRAVENOUS | Status: DC
  Administered 2013-04-14 – 2013-04-15 (×4): 2 g via INTRAVENOUS
  Filled 2013-04-14 (×5): qty 2

## 2013-04-14 MED ORDER — ACETAMINOPHEN 325 MG PO TABS
650.0000 mg | ORAL_TABLET | Freq: Four times a day (QID) | ORAL | Status: DC | PRN
  Administered 2013-04-14 – 2013-04-15 (×2): 650 mg via ORAL
  Filled 2013-04-14 (×3): qty 2

## 2013-04-14 MED ORDER — DEXTROAMPHETAMINE SULFATE 5 MG PO TABS
2.5000 mg | ORAL_TABLET | Freq: Three times a day (TID) | ORAL | Status: DC
Start: 2013-04-14 — End: 2013-04-15

## 2013-04-14 MED ORDER — GUAIFENESIN-DM 100-10 MG/5ML PO SYRP: 5.0000 mL | ORAL_SOLUTION | ORAL | Status: DC | PRN

## 2013-04-14 MED ORDER — ONDANSETRON HCL 4 MG/2ML IJ SOLN
4.0000 mg | Freq: Four times a day (QID) | INTRAMUSCULAR | Status: DC | PRN
  Administered 2013-04-14 – 2013-04-15 (×3): 4 mg via INTRAVENOUS
  Filled 2013-04-14 (×3): qty 2

## 2013-04-14 MED ORDER — BACITRACIN ZINC 500 UNIT/GM EX OINT
1.0000 "application " | TOPICAL_OINTMENT | Freq: Two times a day (BID) | CUTANEOUS | Status: DC
  Administered 2013-04-14: 1 via TOPICAL
  Filled 2013-04-14: qty 15

## 2013-04-14 MED ORDER — ONDANSETRON HCL 4 MG/2ML IJ SOLN
INTRAMUSCULAR | Status: AC
  Administered 2013-04-14: 4 mg
  Filled 2013-04-14: qty 2

## 2013-04-14 MED ORDER — ALBUTEROL SULFATE HFA 108 (90 BASE) MCG/ACT IN AERS
2.0000 | INHALATION_SPRAY | Freq: Four times a day (QID) | RESPIRATORY_TRACT | Status: DC | PRN
  Filled 2013-04-14: qty 6.7

## 2013-04-14 MED ORDER — LEVOTHYROXINE SODIUM 100 MCG PO TABS
100.0000 ug | ORAL_TABLET | Freq: Every day | ORAL | Status: DC
  Administered 2013-04-14 – 2013-04-15 (×2): 100 ug via ORAL
  Filled 2013-04-14 (×3): qty 1

## 2013-04-14 MED ORDER — LIDOCAINE-PRILOCAINE 2.5-2.5 % EX CREA
1.0000 "application " | TOPICAL_CREAM | CUTANEOUS | Status: DC | PRN
  Filled 2013-04-14: qty 5

## 2013-04-14 MED ORDER — VITAMIN C 500 MG PO TABS
500.0000 mg | ORAL_TABLET | Freq: Every day | ORAL | Status: DC
  Administered 2013-04-15: 500 mg via ORAL
  Filled 2013-04-14 (×2): qty 1

## 2013-04-14 MED ORDER — DEXTROSE 5 % IV SOLN
2.0000 g | Freq: Once | INTRAVENOUS | Status: AC
  Administered 2013-04-14: 2 g via INTRAVENOUS
  Filled 2013-04-14: qty 2

## 2013-04-14 MED ORDER — LORAZEPAM 1 MG PO TABS: 1.0000 mg | ORAL_TABLET | Freq: Three times a day (TID) | ORAL | Status: DC | PRN

## 2013-04-14 MED ORDER — SODIUM CHLORIDE 0.9 % IJ SOLN
3.0000 mL | Freq: Two times a day (BID) | INTRAMUSCULAR | Status: DC
  Administered 2013-04-14: 3 mL via INTRAVENOUS

## 2013-04-14 MED ORDER — VANCOMYCIN HCL IN DEXTROSE 1-5 GM/200ML-% IV SOLN
1000.0000 mg | Freq: Two times a day (BID) | INTRAVENOUS | Status: DC
  Administered 2013-04-14 – 2013-04-15 (×3): 1000 mg via INTRAVENOUS
  Filled 2013-04-14 (×4): qty 200

## 2013-04-14 MED ORDER — VANCOMYCIN HCL IN DEXTROSE 1-5 GM/200ML-% IV SOLN
1000.0000 mg | Freq: Once | INTRAVENOUS | Status: AC
  Administered 2013-04-14: 1000 mg via INTRAVENOUS
  Filled 2013-04-14: qty 200

## 2013-04-14 MED ORDER — SODIUM CHLORIDE 0.9 % IV SOLN: INTRAVENOUS | Status: AC

## 2013-04-14 MED ORDER — IBUPROFEN 200 MG PO TABS
400.0000 mg | ORAL_TABLET | Freq: Once | ORAL | Status: AC
  Administered 2013-04-14: 400 mg via ORAL
  Filled 2013-04-14: qty 2

## 2013-04-14 MED ORDER — PROMETHAZINE HCL 25 MG/ML IJ SOLN
12.5000 mg | Freq: Four times a day (QID) | INTRAMUSCULAR | Status: DC | PRN
  Administered 2013-04-14 (×2): 25 mg via INTRAVENOUS
  Filled 2013-04-14 (×2): qty 1

## 2013-04-14 MED ORDER — B COMPLEX-C PO TABS
1.0000 | ORAL_TABLET | Freq: Every day | ORAL | Status: DC
  Administered 2013-04-15: 1 via ORAL
  Filled 2013-04-14 (×2): qty 1

## 2013-04-14 MED ORDER — ENOXAPARIN SODIUM 40 MG/0.4ML ~~LOC~~ SOLN
40.0000 mg | SUBCUTANEOUS | Status: DC
  Administered 2013-04-14 – 2013-04-15 (×2): 40 mg via SUBCUTANEOUS
  Filled 2013-04-14 (×2): qty 0.4

## 2013-04-14 MED ORDER — LORATADINE 10 MG PO TABS
10.0000 mg | ORAL_TABLET | Freq: Every day | ORAL | Status: DC | PRN
  Filled 2013-04-14: qty 1

## 2013-04-14 MED ORDER — ZOLPIDEM TARTRATE 5 MG PO TABS: 5.0000 mg | ORAL_TABLET | Freq: Every evening | ORAL | Status: DC | PRN

## 2013-04-14 MED ORDER — SENNOSIDES-DOCUSATE SODIUM 8.6-50 MG PO TABS
1.0000 | ORAL_TABLET | Freq: Every day | ORAL | Status: DC | PRN
  Filled 2013-04-14: qty 1

## 2013-04-14 MED ORDER — OXYCODONE HCL 5 MG PO TABS: 5.0000 mg | ORAL_TABLET | ORAL | Status: DC | PRN

## 2013-04-14 MED ORDER — ACETAMINOPHEN 650 MG RE SUPP: 650.0000 mg | Freq: Four times a day (QID) | RECTAL | Status: DC | PRN

## 2013-04-14 MED ORDER — ONDANSETRON HCL 4 MG PO TABS: 4.0000 mg | ORAL_TABLET | Freq: Four times a day (QID) | ORAL | Status: DC | PRN

## 2013-04-14 MED ORDER — BACITRACIN 500 UNIT/GM EX OINT
1.0000 "application " | TOPICAL_OINTMENT | Freq: Two times a day (BID) | CUTANEOUS | Status: DC
  Filled 2013-04-14: qty 14

## 2013-04-14 NOTE — Consult Note (Signed)
Port looks ok.  Reconstructions look ok.  Need to contact Dr Kelly Splinter about reconstructions.  Check ultrasound today.  No significant redness.

## 2013-04-14 NOTE — Consult Note (Signed)
Reason for Consult: Left breast ulceration, ?abscess Referring Physician: Dr. Ashok Pall  [facility] Jane Gutierrez is an 57 y.o. female.  HPI: 57 yr old female who is known to our practice after undergoing bilateral mastectomies, bilateral axillary node biopsy by Dr. Dwain Sarna and Immediate bilateral breast reconstruction with expander and Flex HD placement, expander 350 medium height mentor expander and 4 x 16 Flex HD by Dr. Kelly Splinter on 12/31/12.  She needed chemotherapy therefore a port was placed on 01/27/13 by Dr. Dwain Sarna.  She had been doing well but until the last 2 days.  She develop fevers and chills.  She contacted Dr. Leonie Green office and was sent to the ER for further evaluation.  Her Tmax has been 103.1. This am it was 101.  She has noted a scab on the left breast that has had some drainage out of it.  It has also become tender and she has develop erythema.  It seems more swollen as well.  Her workup thus far has not found a source for the fevers however blood and urine cultures are pending.  There is concern the breast may be source and we are asked to evaluate this.   Past Medical History  Diagnosis Date  . IBS (irritable bowel syndrome)   . ADD (attention deficit disorder)   . Lyme disease   . Contact lens/glasses fitting     wears contacts or glasses  . Breast cancer 12/04/12    left, ER/PR +  . Allergy   . Asthma   . Thyroid disease   . Hypothyroidism     Past Surgical History  Procedure Laterality Date  . Brain surgery      removal benign frontal lobe cyst in 1987  . Foot surgery  2013    bilateral  . Gynecologic cryosurgery      cervical cancer, 1998 last remained disease free with recent normal pap  . Diagnostic laparoscopy  2004    lysis adh-uterine ablation  . Breast surgery      bilateral mastopexy  . Breast surgery      removal benign left breast lesion  . Abdominal hysterectomy  2005    TAH/BSO-lysis adh, enometriosis  . Face lift    . Axillary sentinel node biopsy   12/31/2012    Procedure: AXILLARY SENTINEL NODE BIOPSY;  Surgeon: Emelia Loron, MD;  Location: [facility];  Service: General;  Laterality: Bilateral;  bilateral sentinel node  . Total mastectomy  12/31/2012    Procedure: TOTAL MASTECTOMY;  Surgeon: Emelia Loron, MD;  Location: [facility];  Service: General;  Laterality: Bilateral;  bilateral total mastectomies  . Breast reconstruction with placement of tissue expander and flex hd (acellular hydrated dermis)  12/31/2012    Procedure: BREAST RECONSTRUCTION WITH PLACEMENT OF TISSUE EXPANDER AND FLEX HD (ACELLULAR HYDRATED DERMIS);  Surgeon: Wayland Denis, DO;  Location: [facility];  Service: Plastics;  Laterality: Bilateral;  bilateral immediate breast reconstruction with expanders and flex hd   . Portacath placement  01/27/2013    Procedure: INSERTION PORT-A-CATH;  Surgeon: Emelia Loron, MD;  Location: [facility];  Service: General;  Laterality: Right;    Family History  Problem Relation Age of Onset  . Cancer Mother 37    uterine, mult myeloma  . CVA Father   . Cancer Sister 60    breast  . Cancer Maternal Grandmother 40    breast    Social History:  reports that she has never  smoked. She has never used smokeless tobacco. She reports that  drinks alcohol. She reports that she does not use illicit drugs.  Allergies:  Allergies  Allergen Reactions  . Betadine (Povidone Iodine)   . Iodine     REACTION: anaphylactic  . Milk-Related Compounds   . Penicillins   . Shellfish-Derived Products   . Sulfamethoxazole W-Trimethoprim   . Cleocin (Clindamycin Hcl) Rash    Medications: I have reviewed the patient's current medications.  Results for orders placed during the hospital encounter of 04/13/13 (from the past 48 hour(s))  CBC WITH DIFFERENTIAL     Status: Abnormal   Collection Time    04/13/13  8:30 PM      Result Value Range   WBC 4.2  4.0 - 10.5 K/uL   RBC 3.54  (*) 3.87 - 5.11 MIL/uL   Hemoglobin 10.4 (*) 12.0 - 15.0 g/dL   HCT 96.2 (*) 95.2 - 84.1 %   MCV 85.6  78.0 - 100.0 fL   MCH 29.4  26.0 - 34.0 pg   MCHC 34.3  30.0 - 36.0 g/dL   RDW 32.4 (*) 40.1 - 02.7 %   Platelets 153  150 - 400 K/uL   Neutrophils Relative 76  43 - 77 %   Lymphocytes Relative 19  12 - 46 %   Monocytes Relative 2 (*) 3 - 12 %   Eosinophils Relative 1  0 - 5 %   Basophils Relative 2 (*) 0 - 1 %   Neutro Abs 3.2  1.7 - 7.7 K/uL   Lymphs Abs 0.8  0.7 - 4.0 K/uL   Monocytes Absolute 0.1  0.1 - 1.0 K/uL   Eosinophils Absolute 0.0  0.0 - 0.7 K/uL   Basophils Absolute 0.1  0.0 - 0.1 K/uL   RBC Morphology ELLIPTOCYTES     WBC Morphology MILD LEFT SHIFT (1-5% METAS, OCC MYELO, OCC BANDS)     Comment: DOHLE BODIES   Smear Review LARGE PLATELETS PRESENT    COMPREHENSIVE METABOLIC PANEL     Status: Abnormal   Collection Time    04/13/13  8:30 PM      Result Value Range   Sodium 131 (*) 135 - 145 mEq/L   Potassium 3.7  3.5 - 5.1 mEq/L   Chloride 95 (*) 96 - 112 mEq/L   CO2 27  19 - 32 mEq/L   Glucose, Bld 102 (*) 70 - 99 mg/dL   BUN 20  6 - 23 mg/dL   Creatinine, Ser 2.53  0.50 - 1.10 mg/dL   Calcium 8.9  8.4 - 66.4 mg/dL   Total Protein 6.0  6.0 - 8.3 g/dL   Albumin 3.2 (*) 3.5 - 5.2 g/dL   AST 26  0 - 37 U/L   ALT 36 (*) 0 - 35 U/L   Alkaline Phosphatase 50  39 - 117 U/L   Total Bilirubin 0.5  0.3 - 1.2 mg/dL   GFR calc non Af Amer >90  >90 mL/min   GFR calc Af Amer >90  >90 mL/min   Comment:            The eGFR has been calculated     using the CKD EPI equation.     This calculation has not been     validated in all clinical     situations.     eGFR's persistently     <90 mL/min signify     possible Chronic Kidney Disease.  URINALYSIS, ROUTINE W REFLEX  MICROSCOPIC     Status: None   Collection Time    04/13/13  8:54 PM      Result Value Range   Color, Urine YELLOW  YELLOW   APPearance CLEAR  CLEAR   Specific Gravity, Urine 1.027  1.005 - 1.030   pH  6.5  5.0 - 8.0   Glucose, UA NEGATIVE  NEGATIVE mg/dL   Hgb urine dipstick NEGATIVE  NEGATIVE   Bilirubin Urine NEGATIVE  NEGATIVE   Ketones, ur NEGATIVE  NEGATIVE mg/dL   Protein, ur NEGATIVE  NEGATIVE mg/dL   Urobilinogen, UA 0.2  0.0 - 1.0 mg/dL   Nitrite NEGATIVE  NEGATIVE   Leukocytes, UA NEGATIVE  NEGATIVE   Comment: MICROSCOPIC NOT DONE ON URINES WITH NEGATIVE PROTEIN, BLOOD, LEUKOCYTES, NITRITE, OR GLUCOSE <1000 mg/dL.  RAPID STREP SCREEN     Status: Abnormal   Collection Time    04/14/13  2:05 AM      Result Value Range   Streptococcus, Group A Screen (Direct) POSITIVE (*) NEGATIVE  INFLUENZA PANEL BY PCR     Status: None   Collection Time    04/14/13  2:05 AM      Result Value Range   Influenza A By PCR NEGATIVE  NEGATIVE   Influenza B By PCR NEGATIVE  NEGATIVE   H1N1 flu by pcr NOT DETECTED  NOT DETECTED   Comment:            The Xpert Flu assay (FDA approved for     nasal aspirates or washes and     nasopharyngeal swab specimens), is     intended as an aid in the diagnosis of     influenza and should not be used as     a sole basis for treatment.  CG4 I-STAT (LACTIC ACID)     Status: None   Collection Time    04/14/13  2:27 AM      Result Value Range   Lactic Acid, Venous 1.64  0.5 - 2.2 mmol/L  LEGIONELLA ANTIGEN, URINE     Status: None   Collection Time    04/14/13  3:15 AM      Result Value Range   Specimen Description URINE, RANDOM     Special Requests NONE     Legionella Antigen, Urine Negative for Legionella pneumophilia serogroup 1     Report Status 04/14/2013 FINAL    MRSA PCR SCREENING     Status: None   Collection Time    04/14/13  3:16 AM      Result Value Range   MRSA by PCR NEGATIVE  NEGATIVE   Comment:            The GeneXpert MRSA Assay (FDA     approved for NASAL specimens     only), is one component of a     comprehensive MRSA colonization     surveillance program. It is not     intended to diagnose MRSA     infection nor to guide or      monitor treatment for     MRSA infections.  BASIC METABOLIC PANEL     Status: Abnormal   Collection Time    04/14/13  4:30 AM      Result Value Range   Sodium 137  135 - 145 mEq/L   Potassium 3.9  3.5 - 5.1 mEq/L   Chloride 103  96 - 112 mEq/L   Comment: DELTA CHECK NOTED   CO2 27  19 - 32 mEq/L   Glucose, Bld 100 (*) 70 - 99 mg/dL   BUN 13  6 - 23 mg/dL   Creatinine, Ser 4.09  0.50 - 1.10 mg/dL   Calcium 8.2 (*) 8.4 - 10.5 mg/dL   GFR calc non Af Amer >90  >90 mL/min   GFR calc Af Amer >90  >90 mL/min   Comment:            The eGFR has been calculated     using the CKD EPI equation.     This calculation has not been     validated in all clinical     situations.     eGFR's persistently     <90 mL/min signify     possible Chronic Kidney Disease.  CBC     Status: Abnormal   Collection Time    04/14/13  4:30 AM      Result Value Range   WBC 5.9  4.0 - 10.5 K/uL   RBC 3.27 (*) 3.87 - 5.11 MIL/uL   Hemoglobin 9.7 (*) 12.0 - 15.0 g/dL   HCT 81.1 (*) 91.4 - 78.2 %   MCV 86.2  78.0 - 100.0 fL   MCH 29.7  26.0 - 34.0 pg   MCHC 34.4  30.0 - 36.0 g/dL   RDW 95.6 (*) 21.3 - 08.6 %   Platelets 131 (*) 150 - 400 K/uL    Dg Chest 2 View  04/13/2013  *RADIOLOGY REPORT*  Clinical Data: Fever.  Breast cancer.  CHEST - 2 VIEW  Comparison: 01/27/2013 and 10/25/2012  Findings: Power port appears in good position.  Bilateral breast implants for reconstruction.  The heart size and vascularity are normal.  Lungs are clear.  No osseous abnormality.  IMPRESSION: No acute disease.   Original Report Authenticated By: Francene Boyers, M.D.     Review of Systems  Constitutional: Positive for fever, chills and malaise/fatigue.  HENT: Positive for sore throat. Negative for nosebleeds and congestion.   Eyes: Negative.   Respiratory: Positive for cough. Negative for sputum production and shortness of breath.   Cardiovascular: Negative.   Gastrointestinal: Positive for nausea (chemo therapy  related).  Genitourinary: Negative.   Musculoskeletal: Negative.   Skin:       Scabbed area on left breast with erythema    Neurological: Negative.   Endo/Heme/Allergies: Negative.   Psychiatric/Behavioral: Negative.    Blood pressure 118/59, pulse 86, temperature 98.7 F (37.1 C), temperature source Oral, resp. rate 16, height 5\' 5"  (1.651 m), weight 155 lb (70.308 kg), SpO2 98.00%. Physical Exam  Constitutional: She is oriented to person, place, and time. She appears well-developed and well-nourished. No distress.  HENT:  Head: Normocephalic and atraumatic.  Eyes: Conjunctivae are normal. Pupils are equal, round, and reactive to light.  Neck: Normal range of motion. Neck supple.  Cardiovascular: Normal rate and regular rhythm.   Respiratory: Effort normal and breath sounds normal.  GI: Soft. Bowel sounds are normal.  Genitourinary:  deferred  Musculoskeletal: Normal range of motion.  Neurological: She is alert and oriented to person, place, and time.  Skin: Skin is warm and dry. There is erythema.  1 cm circular ulcerated area inferior portion of left breast reconstruction region, surrounding erythema, seems to have a fluctuant area and is tender to the touch.  Feels warm as well.  Not able to express purulent drainage  Psychiatric: She has a normal mood and affect. Her behavior is normal. Thought content normal.  Assessment/Plan: Left Breast ulceration/ulceration/tenderness/poss Abscess with fever s/p bilateral masectomies:  The area is certainly concerning and needs to be worked up further.  Will order an ultrasound on the left breast.  Continue current antibiotics.  If abscess present it may need to be drained.  Will let Dr. Leonie Green office know about the situation and will let Dr. Dwain Sarna know.  Dr. Luisa Hart will evaluate the patient later today and make further recommendations.  Jane Gutierrez 04/14/2013, 11:29 AM

## 2013-04-14 NOTE — ED Notes (Signed)
Attempted to call report to ICU RN. Asked to call back in 5 minutes.

## 2013-04-14 NOTE — Progress Notes (Signed)
CARE MANAGEMENT NOTE 04/14/2013  Patient:  Jane Gutierrez, Jane Gutierrez   Account Number:  [id number]  Date Initiated:  04/14/2013  Documentation initiated by:  DAVIS,RHONDA  Subjective/Objective Assessment:   hx of brest ca with chemo, hypotensive on admit, poss sepsis poss dehyration     Action/Plan:   home   Anticipated DC Date:  04/17/2013   Anticipated DC Plan:  HOME/SELF CARE  In-house referral  NA      DC Planning Services  NA      PAC Choice  NA   Choice offered to / List presented to:  NA   DME arranged  NA      DME agency  NA     HH arranged  NA      HH agency  NA   Status of service:  In process, will continue to follow Medicare Important Message given?  NA - LOS <3 / Initial given by admissions (If response is "NO", the following Medicare IM given date fields will be blank) Date Medicare IM given:   Date Additional Medicare IM given:    Discharge Disposition:    Per UR Regulation:  Reviewed for med. necessity/level of care/duration of stay  If discussed at Long Length of Stay Meetings, dates discussed:    Comments:  [id number] Earlene Plater, RN, BSN, CCM:  CHART REVIEWED AND UPDATED.  Next chart review due on [id number]. NO DISCHARGE NEEDS PRESENT AT THIS TIME. CASE MANAGEMENT [phone]

## 2013-04-14 NOTE — Progress Notes (Signed)
[facility] PROGRESS NOTE  BARBA SOLT ZOX:[id number] DOB: 1956/06/21 DOA: 04/13/2013 PCP: [name], MD  Brief narrative: Addendum to admission note 57 y.o. female with history of breast cancer, status post bilateral mastectomies and subsequent reconstruction with saline expanders, on chemotherapy with Taxotere carboplatinum and Herceptin (completed 4/6 course of on 04/08/13). Patient presented to [facility] ED 04/13/2013 with persistent fevers. Patient also reported mild sore throat and dry cough but no chest pain or shortness of breath. In addition, patient reported erythema and pus drainage in left breast area around areola site. In ED, Tmax was 103.1 F, HR and BP of 81/38 of 143. Her chest x-ray and urinalysis were unremarkable. Blood pressure improved with IV fluids.  Assessment/Plan:  Principal Problem: Fever, sepsis - Possible cellulitis in breast area versus strep throat - Patient is currently on broad-spectrum antibiotics, cefepime and vancomycin - Appreciate surgery following for evaluation of possible left breast cellulitis - Check procalcitonin level Active Problems:   Asthma - Stable, continue albuterol inhaler as needed - Robitussin as needed for cough   Hypothyroidism - Continue levothyroxine   Breast cancer - Management per oncology, under Dr. Milta Deiters care   Anemia of chronic disease - Secondary to history of breast cancer - Hemoglobin 9.7   Code Status: full code Family Communication: no family at the bedside Disposition Plan: home when stable  Manson Passey, MD  [facility] Pager [phone]  If 7PM-7AM, please contact night-coverage [email or web address] Password [email or web address] 04/14/2013, 7:09 AM   LOS: 1 day   Consultants:  [place] [facility]  Procedures:  None   Antibiotics:  Cefepime 04/13/2013 -->  Vancomycin 04/13/2013 -->  HPI/Subjective: No acute overnight events.  Objective: Filed Vitals:   04/14/13 0244 04/14/13 0245 04/14/13 0400 04/14/13 0527   BP:  97/46  105/65  Pulse:  91  88  Temp: 100.6 F (38.1 C) 99.2 F (37.3 C) 98.8 F (37.1 C)   TempSrc: Rectal Oral Oral   Resp:  22  16  Height:      Weight:      SpO2:  97%  96%    Intake/Output Summary (Last 24 hours) at 04/14/13 0709 Last data filed at 04/14/13 0600  Gross per 24 hour  Intake    375 ml  Output      0 ml  Net    375 ml    Exam:   General:  Pt is alert, follows commands appropriately, not in acute distress  Cardiovascular: Regular rate and rhythm, S1/S2, no murmurs, no rubs, no gallops  Breast: some erythema around areola area on left breast  Respiratory: Clear to auscultation bilaterally, no wheezing, no crackles, no rhonchi  Abdomen: Soft, non tender, non distended, bowel sounds present, no guarding  Extremities: No edema, pulses DP and PT palpable bilaterally  Neuro: Grossly nonfocal  Data Reviewed: Basic Metabolic Panel:  Recent Labs Lab 04/13/13 2030 04/14/13 0430  NA 131* 137  K 3.7 3.9  CL 95* 103  CO2 27 27  GLUCOSE 102* 100*  BUN 20 13  CREATININE 0.50 0.55  CALCIUM 8.9 8.2*   Liver Function Tests:  Recent Labs Lab 04/08/13 0817 04/13/13 2030  AST 20 26  ALT 21 36*  ALKPHOS 48 50  BILITOT 0.26 0.5  PROT 6.3* 6.0  ALBUMIN 3.0* 3.2*   CBC:  Recent Labs Lab 04/08/13 0817 04/13/13 2030 04/14/13 0430  WBC 8.2 4.2 5.9  NEUTROABS 4.2 3.2  --   HGB 10.8* 10.4* 9.7*  HCT 32.4* 30.3* 28.2*  MCV 86.6 85.6 86.2  PLT 157 153 131*    RAPID STREP SCREEN     Status: Abnormal   Collection Time    04/14/13  2:05 AM      Result Value Range Status   Streptococcus, Group A Screen (Direct) POSITIVE (*) NEGATIVE Final  MRSA PCR SCREENING     Status: None   Collection Time    04/14/13  3:16 AM      Result Value Range Status   MRSA by PCR NEGATIVE  NEGATIVE Final     Studies: Dg Chest 2 View 04/13/2013  * IMPRESSION: No acute disease.   Original Report Authenticated By: Francene Boyers, M.D.     Scheduled Meds: .  ceFEPime (MAXIPIME) IV  2 g Intravenous Q8H  . enoxaparin (LOVENOX) i  40 mg Subcutaneous Q24H  . levothyroxine  100 mcg Oral QAC breakfast  . vancomycin  1,000 mg Intravenous Q12H  . vitamin C  500 mg Oral Daily   Continuous Infusions: . sodium chloride 125 mL/hr at 04/14/13 0300

## 2013-04-14 NOTE — Progress Notes (Addendum)
ANTIBIOTIC CONSULT NOTE - INITIAL  Pharmacy Consult for  vancomycin Indication: Suspected infection   Allergies  Allergen Reactions  . Betadine (Povidone Iodine)   . Iodine     REACTION: anaphylactic  . Milk-Related Compounds   . Penicillins   . Shellfish-Derived Products   . Sulfamethoxazole W-Trimethoprim   . Cleocin (Clindamycin Hcl) Rash    Patient Measurements: Height: 5\' 5"  (165.1 cm) Weight: 155 lb (70.308 kg) IBW/kg (Calculated) : 57 Adjusted Body Weight:   Vital Signs: Temp: 99.2 F (37.3 C) (04/23 0245) Temp src: Oral (04/23 0245) BP: 97/46 mmHg (04/23 0245) Pulse Rate: 91 (04/23 0245) Intake/Output from previous day:   Intake/Output from this shift:    Labs:  Recent Labs  04/13/13 2030  WBC 4.2  HGB 10.4*  PLT 153  CREATININE 0.50   Estimated Creatinine Clearance: 77.2 ml/min (by C-G formula based on Cr of 0.5). No results found for this basename: VANCOTROUGH, Leodis Binet, VANCORANDOM, GENTTROUGH, GENTPEAK, GENTRANDOM, TOBRATROUGH, TOBRAPEAK, TOBRARND, AMIKACINPEAK, AMIKACINTROU, AMIKACIN,  in the last 72 hours   Microbiology: Recent Results (from the past 720 hour(s))  RAPID STREP SCREEN     Status: Abnormal   Collection Time    04/14/13  2:05 AM      Result Value Range Status   Streptococcus, Group A Screen (Direct) POSITIVE (*) NEGATIVE Final    Medical History: Past Medical History  Diagnosis Date  . IBS (irritable bowel syndrome)   . ADD (attention deficit disorder)   . Lyme disease   . Contact lens/glasses fitting     wears contacts or glasses  . Breast cancer 12/04/12    left, ER/PR +  . Allergy   . Asthma   . Thyroid disease   . Hypothyroidism     Medications:  Anti-infectives   Start     Dose/Rate Route Frequency Ordered Stop   04/14/13 0045  vancomycin (VANCOCIN) IVPB 1000 mg/200 mL premix     1,000 mg 200 mL/hr over 60 Minutes Intravenous  Once 04/14/13 0044 04/14/13 0224   04/14/13 0045  ceFEPIme (MAXIPIME) 2 g in  dextrose 5 % 50 mL IVPB     2 g 100 mL/hr over 30 Minutes Intravenous  Once 04/14/13 0044       Assessment: Patient with Fever with unclear etiology. DD-nonspecific viral syndrome, pharyngitis (rule out strep throat), developing pneumonia, infected left breast ulcer and infected Port-A-Cath.  First dose of antibiotics already given.  Goal of Therapy:  Vancomycin trough level 15-20 mcg/ml Cefepime dosed based on patient weight and renal function   Plan:  Measure antibiotic drug levels at steady state Follow up culture results .vancomycin 1gm iv q12hr Cefepime 2gm iv q8hr  Aleene Davidson Crowford 04/14/2013,2:56 AM

## 2013-04-14 NOTE — Consult Note (Signed)
[address] Jane Gutierrez   DOB:1956-02-13   ZO#:[id number]   JWJ#:[id number]  Subjective: [name] is a 57 year old female with stage IIA ER positive, PR positive, HER-2/neu positive breast cancer currently Cycle 4 day 7 of TCH chemotherapy with weekly Herceptin.  She came to [facility] long ED overnight with fevers, chills, and rigors, tachycardia, and hypotension--meeting SIRS criteria.  She had a mild sore throat and intermittent dry cough, but no chest pain or dyspnea. She had a left breast scab that fell off last week and has recently began having yellowish/green exudate.    She is positive for strep, blood cultures are pending, as well as urinalysis and culture.  She is also going to have an ultrasound of the left breast as recommended by Dr. Luisa Hart to evaluate for a left breast abscess.    Jane Gutierrez is feeling well.  Her fever curve is improving on antibiotics.  She is tired, but states that she will get through this.  She doesn't have any other pain, nausea, vomiting, or concerns.  She wants to make sure she can receive Herceptin tomorrow.   Objective:  Filed Vitals:   04/14/13 1200  BP: 108/57  Pulse: 94  Temp: 100.6 F (38.1 C)  Resp: 22    Body mass index is 25.79 kg/(m^2).  Intake/Output Summary (Last 24 hours) at 04/14/13 1450 Last data filed at 04/14/13 0600  Gross per 24 hour  Intake    375 ml  Output      0 ml  Net    375 ml     Sclerae unicteric  Oropharynx clear  No peripheral adenopathy  Lungs clear -- no rales or rhonchi  Heart regular rate and rhythm  Abdomen benign  MSK no focal spinal tenderness, no peripheral edema  Neuro nonfocal  Breast exam:  1cm circular ulcerated area at lower portion of breast, there is mild surrounding erythema, and is warm and tender to touch.    CBG (last 3)  No results found for this basename: GLUCAP,  in the last 72 hours   Labs:  Lab Results  Component Value Date   WBC 5.9 04/14/2013   HGB 9.7* 04/14/2013   HCT 28.2* 04/14/2013   MCV 86.2  04/14/2013   PLT 131* 04/14/2013   NEUTROABS 3.2 04/13/2013    Urine Studies No results found for this basename: UACOL, UAPR, USPG, UPH, UTP, UGL, UKET, UBIL, UHGB, UNIT, UROB, ULEU, UEPI, UWBC, URBC, UBAC, CAST, CRYS, UCOM, BILUA,  in the last 72 hours  Basic Metabolic Panel:  Recent Labs Lab 04/13/13 2030 04/14/13 0430  NA 131* 137  K 3.7 3.9  CL 95* 103  CO2 27 27  GLUCOSE 102* 100*  BUN 20 13  CREATININE 0.50 0.55  CALCIUM 8.9 8.2*   GFR Estimated Creatinine Clearance: 77.2 ml/min (by C-G formula based on Cr of 0.55). Liver Function Tests:  Recent Labs Lab 04/08/13 0817 04/13/13 2030  AST 20 26  ALT 21 36*  ALKPHOS 48 50  BILITOT 0.26 0.5  PROT 6.3* 6.0  ALBUMIN 3.0* 3.2*   No results found for this basename: LIPASE, AMYLASE,  in the last 168 hours No results found for this basename: AMMONIA,  in the last 168 hours Coagulation profile No results found for this basename: INR, PROTIME,  in the last 168 hours  CBC:  Recent Labs Lab 04/08/13 0817 04/13/13 2030 04/14/13 0430  WBC 8.2 4.2 5.9  NEUTROABS 4.2 3.2  --   HGB 10.8* 10.4* 9.7*  HCT 32.4* 30.3* 28.2*  MCV 86.6 85.6 86.2  PLT 157 153 131*   Cardiac Enzymes: No results found for this basename: CKTOTAL, CKMB, CKMBINDEX, TROPONINI,  in the last 168 hours BNP: No components found with this basename: POCBNP,  CBG: No results found for this basename: GLUCAP,  in the last 168 hours D-Dimer No results found for this basename: DDIMER,  in the last 72 hours Hgb A1c No results found for this basename: HGBA1C,  in the last 72 hours Lipid Profile No results found for this basename: CHOL, HDL, LDLCALC, TRIG, CHOLHDL, LDLDIRECT,  in the last 72 hours Thyroid function studies No results found for this basename: TSH, T4TOTAL, FREET3, T3FREE, THYROIDAB,  in the last 72 hours Anemia work up No results found for this basename: VITAMINB12, FOLATE, FERRITIN, TIBC, IRON, RETICCTPCT,  in the last 72  hours Microbiology Recent Results (from the past 240 hour(s))  RAPID STREP SCREEN     Status: Abnormal   Collection Time    04/14/13  2:05 AM      Result Value Range Status   Streptococcus, Group A Screen (Direct) POSITIVE (*) NEGATIVE Final  MRSA PCR SCREENING     Status: None   Collection Time    04/14/13  3:16 AM      Result Value Range Status   MRSA by PCR NEGATIVE  NEGATIVE Final   Comment:            The GeneXpert MRSA Assay (FDA     approved for NASAL specimens     only), is one component of a     comprehensive MRSA colonization     surveillance program. It is not     intended to diagnose MRSA     infection nor to guide or     monitor treatment for     MRSA infections.      Studies:  Dg Chest 2 View  04/13/2013  *RADIOLOGY REPORT*  Clinical Data: Fever.  Breast cancer.  CHEST - 2 VIEW  Comparison: 01/27/2013 and 10/25/2012  Findings: Power port appears in good position.  Bilateral breast implants for reconstruction.  The heart size and vascularity are normal.  Lungs are clear.  No osseous abnormality.  IMPRESSION: No acute disease.   Original Report Authenticated By: Francene Boyers, M.D.     Assessment/Plan 57 y.o. female with stage IIA ER positive, PR positive, HER-2/neu positive left breast cancer receiving TCH chemotherapy cycle 4 day 7 admitted to the hospital with fevers, and sepsis.    1. Stage IIA left breast cancer.  Patient is currently undergoing chemotherapy/weekly herceptin and can proceed with this tomorrow.  Continue with daily labs and monitoring.  2. Infection.  Patient is positive for strep, blood cultures and urine culture are pending. Ultrasound of the left breast are pending.  Currently on Cefepime and Vancomycin, fever curve is trending down, would continue current regimen until more information is available.    3. Other medical problems, as per hospitalist team.    Cherie Ouch. Lyn Hollingshead, NP Medical Oncology [facility] Phone: [phone] Augustin Schooling 04/14/2013   ATTENDING'S ATTESTATION:  I personally reviewed patient's chart, examined patient myself, formulated the treatment plan as above.  Drue Second, MD Medical/Oncology [facility] [phone] (beeper) [phone] (Office)

## 2013-04-14 NOTE — ED Notes (Signed)
Report called to Step Down RN, Juliette.

## 2013-04-14 NOTE — H&P (Addendum)
[facility] History and Physical  Jane Gutierrez JYN:[id number] DOB: Apr 28, 1956 DOA: 04/13/2013  Referring physician: Dr. Jaci Carrel, EDP PCP: [name], MD  Specialists:  1. Oncology: Dr. Drue Second 2. Surgery: Dr. Emelia Loron. 3. Plastic Surgery: Dr. Kelly Splinter at [facility]. 4. GI: Dr. Charna Elizabeth   Chief Complaint: Fever  HPI: Jane Gutierrez is a 57 y.o. 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 Completed 4/6 course of on 04/08/13), ADD, hypothyroidism presented to [facility] ED on 04/13/13 with fevers. She was in her usual state of health until 6 PM last night. She worked all day today and even exercised this morning. At 6 PM last night, she started having chills and rigors and high fevers. She complains of mild sore throat and intermittent dry cough but no chest pain or dyspnea. She was exposed to somebody with the dry cough 3 days ago and her fianc who had connective tissue disorder and pneumonia and hospitalized died one week ago. She denies insect bites. She has usual nausea postchemotherapy but no vomiting, abdominal pain or diarrhea. She has a chronic small ulcerated lesion on the left breast which recently un scabbed and has been intermittently draining yellow/green color fluid without order. Yesterday she bent to pick up something and thinks that she might have sprained her left breast which feels sore. She denies urinary frequency or dysuria. No leg pains. She contacted the oncologist on-call and was advised to come to the ED for further evaluation. In the ED, MAXIMUM TEMPERATURE 103.1, tachycardic at 143 per minute, initially hypotensive at 81/38 mmHg, normal white cell count, negative UA and chest x-ray. Her fever, hypotension and tachycardia improved after IV fluid  hydration and NSAIDs. EDP contacted patient's primary care service and covering physician deferred admission to the hospitalist service.   Review of Systems: All systems reviewed and apart from history of presenting illness, are negative  Past Medical History  Diagnosis Date  . IBS (irritable bowel syndrome)   . ADD (attention deficit disorder)   . Lyme disease   . Contact lens/glasses fitting     wears contacts or glasses  . Breast cancer 12/04/12    left, ER/PR +  . Allergy   . Asthma   . Thyroid disease   . Hypothyroidism    Past Surgical History  Procedure Laterality Date  . Brain surgery      removal benign frontal lobe cyst in 1987  . Foot surgery  2013    bilateral  . Gynecologic cryosurgery      cervical cancer, 1998 last remained disease free with recent normal pap  . Diagnostic laparoscopy  2004    lysis adh-uterine ablation  . Breast surgery      bilateral mastopexy  . Breast surgery      removal benign left breast lesion  . Abdominal hysterectomy  2005    TAH/BSO-lysis adh, enometriosis  . Face lift    . Axillary sentinel node biopsy  12/31/2012    Procedure: AXILLARY SENTINEL NODE BIOPSY;  Surgeon: Emelia Loron, MD;  Location: [facility];  Service: General;  Laterality: Bilateral;  bilateral sentinel node  . Total mastectomy  12/31/2012    Procedure: TOTAL MASTECTOMY;  Surgeon: Emelia Loron, MD;  Location: [facility];  Service: General;  Laterality: Bilateral;  bilateral total mastectomies  .  Breast reconstruction with placement of tissue expander and flex hd (acellular hydrated dermis)  12/31/2012    Procedure: BREAST RECONSTRUCTION WITH PLACEMENT OF TISSUE EXPANDER AND FLEX HD (ACELLULAR HYDRATED DERMIS);  Surgeon: Wayland Denis, DO;  Location: [facility];  Service: Plastics;  Laterality: Bilateral;  bilateral immediate breast reconstruction with expanders and flex hd   . Portacath placement  01/27/2013     Procedure: INSERTION PORT-A-CATH;  Surgeon: Emelia Loron, MD;  Location: [facility];  Service: General;  Laterality: Right;   Social History:  reports that she has never smoked. She does not have any smokeless tobacco history on file. She reports that  drinks alcohol. She reports that she does not use illicit drugs. Single. Independent of activities of daily living.  Allergies  Allergen Reactions  . Betadine (Povidone Iodine)   . Iodine     REACTION: anaphylactic  . Milk-Related Compounds   . Penicillins   . Shellfish-Derived Products   . Sulfamethoxazole W-Trimethoprim   . Cleocin (Clindamycin Hcl) Rash    Family History  Problem Relation Age of Onset  . Cancer Mother 60    uterine, mult myeloma  . CVA Father   . Cancer Sister 8    breast  . Cancer Maternal Grandmother 37    breast    Prior to Admission medications   Medication Sig Start Date End Date Taking? Authorizing Provider  albuterol (PROVENTIL HFA;VENTOLIN HFA) 108 (90 BASE) MCG/ACT inhaler Inhale 2 puffs into the lungs every 6 (six) hours as needed for shortness of breath.    Yes Historical Provider, MD  B Complex-C (B-COMPLEX WITH VITAMIN C) tablet Take 1 tablet by mouth daily.   Yes Historical Provider, MD  bacitracin 500 UNIT/GM ointment Apply 1 application topically 2 (two) times daily. Applied to wound on breast.   Yes Historical Provider, MD  dexamethasone (DECADRON) 4 MG tablet Take 8 mg by mouth 2 (two) times daily with a meal. Taken day before, day of, and for 3 days after chemo.   Yes Historical Provider, MD  dextroamphetamine (DEXTROSTAT) 5 MG tablet Take 2.5 mg by mouth 3 (three) times daily.    Yes Historical Provider, MD  EPINEPHrine (EPIPEN) 0.3 mg/0.3 mL DEVI Inject 0.3 mg into the muscle as needed (for allergic reaction).    Yes Historical Provider, MD  eszopiclone (LUNESTA) 2 MG TABS Take 2 mg by mouth at bedtime as needed (for sleep).    Yes Historical Provider, MD  levothyroxine  (SYNTHROID, LEVOTHROID) 100 MCG tablet Take 100 mcg by mouth daily.   Yes Historical Provider, MD  lidocaine-prilocaine (EMLA) cream Apply 1 application topically as needed (for port-a-cath access).   Yes Historical Provider, MD  loratadine (CLARITIN) 10 MG tablet Take 10 mg by mouth daily as needed (for 5 days after Neulasta injection).   Yes Historical Provider, MD  LORazepam (ATIVAN) 1 MG tablet Take 1 mg by mouth every 8 (eight) hours as needed for anxiety.   Yes Historical Provider, MD  Multiple Vitamin (MULTIVITAMIN WITH MINERALS) TABS Take 1 tablet by mouth daily.   Yes Historical Provider, MD  ondansetron (ZOFRAN ODT) 8 MG disintegrating tablet Take 1 tablet (8 mg total) by mouth every 8 (eight) hours as needed for nausea. 03/25/13  Yes Augustin Schooling, NP  PRESCRIPTION MEDICATION Inject into the vein as directed. Herceptin infusion weekly on Thursdays; Taxotere and Paraplatin q3weeks with Neulasta support.   Yes Historical Provider, MD  prochlorperazine (COMPAZINE) 10 MG tablet Take 1 tablet (  10 mg total) by mouth every 6 (six) hours as needed (Nausea or vomiting). 01/11/13  Yes Victorino December, MD  promethazine (PHENERGAN) 25 MG tablet Take 6.25-25 mg by mouth every 6 (six) hours as needed for nausea.   Yes Historical Provider, MD  sennosides-docusate sodium (SENOKOT-S) 8.6-50 MG tablet Take 1 tablet by mouth daily as needed for constipation.    Yes Historical Provider, MD  vitamin C (ASCORBIC ACID) 500 MG tablet Take 500 mg by mouth daily.   Yes Historical Provider, MD   Physical Exam: Filed Vitals:   04/13/13 2200 04/13/13 2339 04/14/13 0100 04/14/13 0101  BP: 101/52 105/57 102/54   Pulse: 107 106 103   Temp:  100.7 F (38.2 C)    TempSrc:  Rectal    Resp: 21 22 25    Height:    5\' 5"  (1.651 m)  Weight:    70.308 kg (155 lb)  SpO2: 96% 100% 95%    Patient was examined with her female ED nurse chaperone in the room.  General exam: Moderately built and nourished female patient,  lying comfortably supine on the gurney in no obvious distress. Does not look septic or toxic.  Head, eyes and ENT: Nontraumatic and normocephalic. Pupils equally reacting to light and accommodation. Oral mucosa slightly dry. Alopecia of scalp.  Neck: Supple. No JVD, carotid bruit or thyromegaly.  Lymphatics: No lymphadenopathy.  Respiratory system: Clear to auscultation. No increased work of breathing. Porta cath right upper anterior chest has no acute findings.  Cardiovascular system: S1 and S2 heard, mildly tachycardic. No JVD, murmurs, gallops, clicks or pedal edema.  Gastrointestinal system: Abdomen is nondistended, soft and nontender. Normal bowel sounds heard. No organomegaly or masses appreciated. Laparotomy scar across lower abdomen.  Central nervous system: Alert and oriented. No focal neurological deficits.  Extremities: Symmetric 5 x 5 power. Peripheral pulses symmetrically felt.  Skin: No rashes or acute findings.  Musculoskeletal system: Negative exam.  Psychiatry: Pleasant and cooperative.  Breasts: Approximately 0.5 cm diameter superficial ulcer at 6:00 position on the left breast with greenish base and mild erythema around the ulcer. No tenderness, warmth or drainage at this time. No fluctuance.   Labs on Admission:  Basic Metabolic Panel:  Recent Labs Lab 04/08/13 0817 04/13/13 2030  NA 138 131*  K 3.6 3.7  CL 105 95*  CO2 24 27  GLUCOSE 90 102*  BUN 25.3 20  CREATININE 0.6 0.50  CALCIUM 8.8 8.9   Liver Function Tests:  Recent Labs Lab 04/08/13 0817 04/13/13 2030  AST 20 26  ALT 21 36*  ALKPHOS 48 50  BILITOT 0.26 0.5  PROT 6.3* 6.0  ALBUMIN 3.0* 3.2*   No results found for this basename: LIPASE, AMYLASE,  in the last 168 hours No results found for this basename: AMMONIA,  in the last 168 hours CBC:  Recent Labs Lab 04/08/13 0817 04/13/13 2030  WBC 8.2 4.2  NEUTROABS 4.2 3.2  HGB 10.8* 10.4*  HCT 32.4* 30.3*  MCV 86.6 85.6  PLT 157  153   Cardiac Enzymes: No results found for this basename: CKTOTAL, CKMB, CKMBINDEX, TROPONINI,  in the last 168 hours  BNP (last 3 results) No results found for this basename: PROBNP,  in the last 8760 hours CBG: No results found for this basename: GLUCAP,  in the last 168 hours  Radiological Exams on Admission: Dg Chest 2 View  04/13/2013  *RADIOLOGY REPORT*  Clinical Data: Fever.  Breast cancer.  CHEST - 2 VIEW  Comparison: 01/27/2013 and 10/25/2012  Findings: Power port appears in good position.  Bilateral breast implants for reconstruction.  The heart size and vascularity are normal.  Lungs are clear.  No osseous abnormality.  IMPRESSION: No acute disease.   Original Report Authenticated By: Francene Boyers, M.D.       Assessment/Plan Principal Problem:   SIRS (systemic inflammatory response syndrome) Active Problems:   Asthma   Hypothyroidism   Breast cancer   Anemia   Dehydration   Fever   1. Systemic inflammatory response syndrome: Unclear etiology. DD-nonspecific viral syndrome, pharyngitis (rule out strep throat), developing pneumonia, infected left breast ulcer and infected Port-A-Cath. Admit to stepdown. Followup blood and urine cultures. Check rapid strep screen, urine Legionella antigen and influenza panel. VTE is a possibility but patient is physically active and has no chest pain, dyspnea, lower extremity swelling or pain and temperature seems quite high for this. IV fluid hydration. Empiric IV vancomycin and cefepime. Patient has tolerated Keflex in the past. Discussed with ID M.D. on call who agrees with above management. May consider discussing with surgeon/plastic surgeon in a.m. to evaluate left breast ulcer. Clinically improving. If patient continues to spike fevers/does not defervesce, consider formal ID consultation in am. 2. Dehydration with hyponatremia: Secondary to fever. IV fluids. 3. Anemia: Secondary to cancer and chemotherapy. Follow CBC in  a.m. 4. Asthma: Stable. 5. Breast cancer, status post bilateral mastectomy: OP follow up with oncology. May consider discussing with oncology in a.m. 6. Left breast ulcer,?? Infected: Continue antibiotics and discussion as per problem #1 7. Hypothyroidism: Continue Synthroid. 8. ADD: Continue home medications.     Code Status: Full  Family Communication: Discussed directly with patient.  Disposition Plan: Admit to inpatient. Home when medically stable.   Time spent: 65 minutes  [facility] Pager [phone]  If 7PM-7AM, please contact night-coverage [email or web address] Password [facility] 04/14/2013, 1:27 AM  Addendum Throat exam on admission: patient did have mild erythema of posterior pharyngeal wall without any exudate or ulcers.  Adean Milosevic 7:09 AM 04/15/2013

## 2013-04-15 ENCOUNTER — Other Ambulatory Visit: Payer: 59 | Admitting: Lab

## 2013-04-15 ENCOUNTER — Ambulatory Visit: Payer: 59

## 2013-04-15 ENCOUNTER — Ambulatory Visit: Payer: 59 | Admitting: Adult Health

## 2013-04-15 DIAGNOSIS — D649 Anemia, unspecified: Secondary | ICD-10-CM

## 2013-04-15 DIAGNOSIS — E079 Disorder of thyroid, unspecified: Secondary | ICD-10-CM

## 2013-04-15 DIAGNOSIS — R6510 Systemic inflammatory response syndrome (SIRS) of non-infectious origin without acute organ dysfunction: Secondary | ICD-10-CM

## 2013-04-15 LAB — URINE CULTURE: Colony Count: NO GROWTH

## 2013-04-15 MED ORDER — AMOXICILLIN-POT CLAVULANATE 875-125 MG PO TABS: 1.0000 | ORAL_TABLET | Freq: Two times a day (BID) | ORAL | Status: DC

## 2013-04-15 MED ORDER — DIPHENHYDRAMINE HCL 50 MG/ML IJ SOLN: 50.0000 mg | Freq: Once | INTRAMUSCULAR | Status: DC | PRN

## 2013-04-15 MED ORDER — EPINEPHRINE HCL 1 MG/ML IJ SOLN: 0.5000 mg | Freq: Once | INTRAMUSCULAR | Status: DC | PRN

## 2013-04-15 MED ORDER — EPINEPHRINE HCL 0.1 MG/ML IJ SOLN: 0.2500 mg | Freq: Once | INTRAMUSCULAR | Status: DC | PRN

## 2013-04-15 MED ORDER — ACETAMINOPHEN 325 MG PO TABS
650.0000 mg | ORAL_TABLET | Freq: Once | ORAL | Status: AC
  Administered 2013-04-15: 650 mg via ORAL

## 2013-04-15 MED ORDER — TRASTUZUMAB CHEMO INJECTION 440 MG
2.0000 mg/kg | Freq: Once | INTRAVENOUS | Status: AC
  Administered 2013-04-15: 147 mg via INTRAVENOUS
  Filled 2013-04-15: qty 7

## 2013-04-15 MED ORDER — HEPARIN SOD (PORK) LOCK FLUSH 100 UNIT/ML IV SOLN: 500.0000 [IU] | Freq: Once | INTRAVENOUS | Status: DC | PRN

## 2013-04-15 MED ORDER — METHYLPREDNISOLONE SODIUM SUCC 125 MG IJ SOLR
125.0000 mg | Freq: Once | INTRAMUSCULAR | Status: DC | PRN
  Filled 2013-04-15: qty 2

## 2013-04-15 MED ORDER — ALBUTEROL SULFATE (5 MG/ML) 0.5% IN NEBU: 2.5000 mg | INHALATION_SOLUTION | Freq: Once | RESPIRATORY_TRACT | Status: DC | PRN

## 2013-04-15 MED ORDER — FAMOTIDINE IN NACL 20-0.9 MG/50ML-% IV SOLN: 20.0000 mg | Freq: Once | INTRAVENOUS | Status: DC | PRN

## 2013-04-15 MED ORDER — HEPARIN SOD (PORK) LOCK FLUSH 100 UNIT/ML IV SOLN
INTRAVENOUS | Status: DC
Start: 2013-04-15 — End: 2013-04-15
  Filled 2013-04-15: qty 5

## 2013-04-15 MED ORDER — SODIUM CHLORIDE 0.9 % IV SOLN: Freq: Once | INTRAVENOUS | Status: DC

## 2013-04-15 MED ORDER — ALTEPLASE 2 MG IJ SOLR: 2.0000 mg | Freq: Once | INTRAMUSCULAR | Status: DC | PRN

## 2013-04-15 MED ORDER — DIPHENHYDRAMINE HCL 50 MG/ML IJ SOLN: 25.0000 mg | Freq: Once | INTRAMUSCULAR | Status: DC | PRN

## 2013-04-15 MED ORDER — HEPARIN SOD (PORK) LOCK FLUSH 100 UNIT/ML IV SOLN: 250.0000 [IU] | Freq: Once | INTRAVENOUS | Status: DC | PRN

## 2013-04-15 MED ORDER — OXYCODONE HCL 5 MG PO TABS: 5.0000 mg | ORAL_TABLET | ORAL | Status: DC | PRN

## 2013-04-15 MED ORDER — SODIUM CHLORIDE 0.9 % IJ SOLN: 10.0000 mL | INTRAMUSCULAR | Status: DC | PRN

## 2013-04-15 MED ORDER — DEXTROSE 5 % IV SOLN
1.0000 g | Freq: Three times a day (TID) | INTRAVENOUS | Status: DC
  Filled 2013-04-15 (×2): qty 1

## 2013-04-15 MED ORDER — SODIUM CHLORIDE 0.9 % IJ SOLN: 3.0000 mL | INTRAMUSCULAR | Status: DC | PRN

## 2013-04-15 MED ORDER — SODIUM CHLORIDE 0.9 % IV SOLN: Freq: Once | INTRAVENOUS | Status: DC | PRN

## 2013-04-15 NOTE — Plan of Care (Signed)
Problem: Discharge Progression Outcomes Goal: Tolerating diet Outcome: Progressing Poor appetite.

## 2013-04-15 NOTE — Progress Notes (Signed)
ANTIBIOTIC CONSULT NOTE - Follow-up  Pharmacy Consult for  vancomycin Indication: Suspected infection   Allergies  Allergen Reactions  . Betadine (Povidone Iodine)   . Iodine     REACTION: anaphylactic  . Penicillins   . Shellfish-Derived Products   . Sulfamethoxazole W-Trimethoprim   . Cleocin (Clindamycin Hcl) Rash    Patient Measurements: Height: 5\' 5"  (165.1 cm) Weight: 155 lb (70.308 kg) IBW/kg (Calculated) : 57 Adjusted Body Weight:   Vital Signs: Temp: 98.7 F (37.1 C) (04/24 0800) Temp src: Oral (04/24 0800) BP: 110/67 mmHg (04/24 0740) Pulse Rate: 88 (04/24 0740) Intake/Output from previous day: 04/23 0701 - 04/24 0700 In: 1248.3 [I.V.:998.3; IV Piggyback:250] Out: -  Intake/Output from this shift: Total I/O In: 50 [IV Piggyback:50] Out: -   Labs:  Recent Labs  04/13/13 2030 04/14/13 0430  WBC 4.2 5.9  HGB 10.4* 9.7*  PLT 153 131*  CREATININE 0.50 0.55   Estimated Creatinine Clearance: 77.2 ml/min (by C-G formula based on Cr of 0.55). No results found for this basename: VANCOTROUGH, Leodis Binet, VANCORANDOM, GENTTROUGH, GENTPEAK, GENTRANDOM, TOBRATROUGH, TOBRAPEAK, TOBRARND, AMIKACINPEAK, AMIKACINTROU, AMIKACIN,  in the last 72 hours   Microbiology: Recent Results (from the past 720 hour(s))  CULTURE, BLOOD (ROUTINE X 2)     Status: None   Collection Time    04/13/13  8:35 PM      Result Value Range Status   Specimen Description BLOOD LEFT HAND   Final   Special Requests BOTTLES DRAWN AEROBIC AND ANAEROBIC 5CC   Final   Culture  Setup Time 04/14/2013 03:10   Final   Culture     Final   Value:        BLOOD CULTURE RECEIVED NO GROWTH TO DATE CULTURE WILL BE HELD FOR 5 DAYS BEFORE ISSUING A FINAL NEGATIVE REPORT   Report Status PENDING   Incomplete  CULTURE, BLOOD (ROUTINE X 2)     Status: None   Collection Time    04/13/13  8:40 PM      Result Value Range Status   Specimen Description BLOOD LEFT ARM   Final   Special Requests BOTTLES DRAWN  AEROBIC AND ANAEROBIC 5CC   Final   Culture  Setup Time 04/14/2013 03:10   Final   Culture     Final   Value:        BLOOD CULTURE RECEIVED NO GROWTH TO DATE CULTURE WILL BE HELD FOR 5 DAYS BEFORE ISSUING A FINAL NEGATIVE REPORT   Report Status PENDING   Incomplete  URINE CULTURE     Status: None   Collection Time    04/13/13  8:54 PM      Result Value Range Status   Specimen Description URINE, CLEAN CATCH   Final   Special Requests Immunocompromised   Final   Culture  Setup Time 04/14/2013 07:02   Final   Colony Count NO GROWTH   Final   Culture NO GROWTH   Final   Report Status 04/15/2013 FINAL   Final  RAPID STREP SCREEN     Status: Abnormal   Collection Time    04/14/13  2:05 AM      Result Value Range Status   Streptococcus, Group A Screen (Direct) POSITIVE (*) NEGATIVE Final  MRSA PCR SCREENING     Status: None   Collection Time    04/14/13  3:16 AM      Result Value Range Status   MRSA by PCR NEGATIVE  NEGATIVE Final   Comment:  The GeneXpert MRSA Assay (FDA     approved for NASAL specimens     only), is one component of a     comprehensive MRSA colonization     surveillance program. It is not     intended to diagnose MRSA     infection nor to guide or     monitor treatment for     MRSA infections.    Medical History: Past Medical History  Diagnosis Date  . IBS (irritable bowel syndrome)   . ADD (attention deficit disorder)   . Lyme disease   . Contact lens/glasses fitting     wears contacts or glasses  . Breast cancer 12/04/12    left, ER/PR +  . Allergy   . Asthma   . Thyroid disease   . Hypothyroidism     Medications:  Anti-infectives   Start     Dose/Rate Route Frequency Ordered Stop   04/14/13 1000  vancomycin (VANCOCIN) IVPB 1000 mg/200 mL premix     1,000 mg 200 mL/hr over 60 Minutes Intravenous Every 12 hours 04/14/13 0304     04/14/13 0800  ceFEPIme (MAXIPIME) 2 g in dextrose 5 % 50 mL IVPB     2 g 100 mL/hr over 30 Minutes  Intravenous Every 8 hours 04/14/13 0304     04/14/13 0045  vancomycin (VANCOCIN) IVPB 1000 mg/200 mL premix     1,000 mg 200 mL/hr over 60 Minutes Intravenous  Once 04/14/13 0044 04/14/13 0224   04/14/13 0045  ceFEPIme (MAXIPIME) 2 g in dextrose 5 % 50 mL IVPB     2 g 100 mL/hr over 30 Minutes Intravenous  Once 04/14/13 0044 04/14/13 0315     Assessment: Patient with Fever with unclear etiology. DD-nonspecific viral syndrome, pharyngitis (rule out strep throat), developing pneumonia, infected left breast ulcer and infected Port-A-Cath.  L breast U/S reveals fluid collection - c/w abscess or seroma  4/23 >> Vanc >> 4/23 >> Cefepime >>  Tmax: 100.8 WBCs: WNL (Neulasta 4/18) Renal: SCr 0.55, CrCl 88N, 77CG  4/22 blood x2: 4/22 urine: NG 4/23 rapid strep: POSITIVE  Dose changes/drug level info:  4/25: VT = ___mcg/ml on 1gm IV q12h (prior to 6th dose)  Goal of Therapy:  Vancomycin trough level 15-20 mcg/ml Cefepime dosed based on patient weight and renal function   Plan:   Continue vancomycin 1gm IV q12h and check trough tom am  Change cefepime to 1gm IV q8h as patient is not neutropenic and is clinically stable.   Dannielle Huh 04/15/2013,9:02 AM

## 2013-04-15 NOTE — Progress Notes (Signed)
Met with Jane Gutierrez at bedside to discuss Jane Gutierrez to [employer] program for [employer] employees with [employer]. Jane Gutierrez reports she does well at home with a great support system. No identifiable Link to [facility] Care Management needs at this time. Appreciative of the visit. Left contact information and brochure at bedside. Of note, patient reports she does not think she needs a post discharge follow up call either.  Raiford Noble, MSN-Ed, RN,BSN- [facility] [phone]

## 2013-04-15 NOTE — Progress Notes (Signed)
Patient ID: Jane Gutierrez, female   DOB: 1956/08/10, 57 y.o.   MRN: [id number] Spoke with Dr. Kelly Splinter regarding patient.  Dr. Kelly Splinter will see patient and make further recommendations regarding breast.  Surgery will sign off at this time.  Please call with questions or concerns.  Kvion Shapley 04/15/2013 8:37 AM

## 2013-04-15 NOTE — Progress Notes (Signed)
Subjective: Admitted for IV antibiotics. Feeling better today.  Objective: Vital signs in last 24 hours: Temp:  [98.5 F (36.9 C)-100.8 F (38.2 C)] 98.7 F (37.1 C) (04/24 0800) Pulse Rate:  [88-112] 88 (04/24 0740) Resp:  [18-23] 19 (04/24 0740) BP: (105-128)/(57-70) 110/67 mmHg (04/24 0740) SpO2:  [95 %-97 %] 97 % (04/24 0740) Last BM Date: 04/14/13  Intake/Output from previous day: 04/23 0701 - 04/24 0700 In: 1248.3 [I.V.:998.3; IV Piggyback:250] Out: -  Intake/Output this shift: Total I/O In: 50 [IV Piggyback:50] Out: -   General appearance: alert, cooperative and no distress Incision/Wound: left breast swollen, red and tender with a mild ulcer at the inframammary fold.  Lab Results:   Recent Labs  04/13/13 2030 04/14/13 0430  WBC 4.2 5.9  HGB 10.4* 9.7*  HCT 30.3* 28.2*  PLT 153 131*   BMET  Recent Labs  04/13/13 2030 04/14/13 0430  NA 131* 137  K 3.7 3.9  CL 95* 103  CO2 27 27  GLUCOSE 102* 100*  BUN 20 13  CREATININE 0.50 0.55  CALCIUM 8.9 8.2*   PT/INR No results found for this basename: LABPROT, INR,  in the last 72 hours ABG No results found for this basename: PHART, PCO2, PO2, HCO3,  in the last 72 hours  Studies/Results: Dg Chest 2 View  04/13/2013  *RADIOLOGY REPORT*  Clinical Data: Fever.  Breast cancer.  CHEST - 2 VIEW  Comparison: 01/27/2013 and 10/25/2012  Findings: Power port appears in good position.  Bilateral breast implants for reconstruction.  The heart size and vascularity are normal.  Lungs are clear.  No osseous abnormality.  IMPRESSION: No acute disease.   Original Report Authenticated By: Francene Boyers, M.D.    [place] Chest  04/14/2013  *RADIOLOGY REPORT*  Clinical Data: Left breast tenderness and erythema.  CHEST ULTRASOUND  Comparison: None.  Findings: Ultrasound does demonstrate a  fluid collection overlying the breast expander.  There is edema in the subcutaneous fat superficial to this fluid collection.  The collection  is approximately 4 x 4 x 1 cm.  IMPRESSION:   Focal fluid collection slightly inferior and lateral to the site of the open sore on the left breast.  This could represent a seroma or abscess.  There is slight edema in the overlying subcutaneous fat.   Original Report Authenticated By: Francene Boyers, M.D.     Anti-infectives: Anti-infectives   Start     Dose/Rate Route Frequency Ordered Stop   04/15/13 1600  ceFEPIme (MAXIPIME) 1 g in dextrose 5 % 50 mL IVPB     1 g 100 mL/hr over 30 Minutes Intravenous Every 8 hours 04/15/13 0908     04/14/13 1000  vancomycin (VANCOCIN) IVPB 1000 mg/200 mL premix     1,000 mg 200 mL/hr over 60 Minutes Intravenous Every 12 hours 04/14/13 0304     04/14/13 0800  ceFEPIme (MAXIPIME) 2 g in dextrose 5 % 50 mL IVPB  Status:  Discontinued     2 g 100 mL/hr over 30 Minutes Intravenous Every 8 hours 04/14/13 0304 04/15/13 0908   04/14/13 0045  vancomycin (VANCOCIN) IVPB 1000 mg/200 mL premix     1,000 mg 200 mL/hr over 60 Minutes Intravenous  Once 04/14/13 0044 04/14/13 0224   04/14/13 0045  ceFEPIme (MAXIPIME) 2 g in dextrose 5 % 50 mL IVPB     2 g 100 mL/hr over 30 Minutes Intravenous  Once 04/14/13 0044 04/14/13 0315      Assessment/Plan: s/p * No  surgery found * we drained 50 cc of serous fluid from the left breast and sent it for gram stain/culture and sensitivity.  Also removed 50 cc of NS from left breast expander to decrease tension on the wound. Has appointment with me on Tuesday.  LOS: 2 days    [facility] 04/15/2013

## 2013-04-15 NOTE — Discharge Summary (Signed)
Physician Discharge Summary  REMEE CHARLEY ZOX:[id number] DOB: 1956/05/19 DOA: 04/13/2013  PCP: [name], MD  Admit date: 04/13/2013 Discharge date: 04/15/2013  Recommendations for Outpatient Follow-up:  1. Patient will followup with plastic surgery Tuesday next week 2. Patient is discharged with Augmentin for 5 days for strep throat which should take care of potential infected breast fluid which was drained by surgery.  Discharge Diagnoses:  Principal Problem:   SIRS (systemic inflammatory response syndrome) Active Problems:   Asthma   Hypothyroidism   Breast cancer   Anemia   Dehydration   Fever  Discharge Condition: Medically stable for discharge home today. Patient is insisting on going home today.  Diet recommendation: As tolerated  History of present illness:  57 y.o. female with history of breast cancer, status post bilateral mastectomies and subsequent reconstruction with saline expanders, on chemotherapy with Taxotere carboplatinum and Herceptin (completed 4/6 course of on 04/08/13). Patient presented to [facility] ED 04/13/2013 with persistent fevers. Patient also reported mild sore throat and dry cough but no chest pain or shortness of breath. In addition, patient reported erythema and pus drainage in left breast area around areola site.  In ED, Tmax was 103.1 F, HR and BP of 81/38 of 143. Her chest x-ray and urinalysis were unremarkable. Blood pressure improved with IV fluids.   Assessment/Plan:   Principal Problem:  Fever, sepsis  - Possible cellulitis in breast area versus strep throat  - Patient is currently on broad-spectrum antibiotics, cefepime and vancomycin  - Appreciate surgery following for evaluation of possible left breast cellulitis. Per surgery, fluid was drained from the left breast and sent for analysis. Patient can follow with them Tuesday next week - Check procalcitonin level - only mildly elevated on admission, 0.76 - We will discontinue cefepime and  vancomycin. On discharge, patient will receive Augmentin for 5 days for strep throat and possibly infected fluid in the left breast. Active Problems:  Asthma  - Stable, continue albuterol inhaler as needed  - Robitussin as needed for cough  Hypothyroidism  - Continue levothyroxine  Breast cancer  - Management per oncology, under Dr. Milta Deiters care  - Chemotherapy will be given today prior to discharge Anemia of chronic disease  - Secondary to history of breast cancer  - Hemoglobin 9.7   Code Status: full code  Family Communication: no family at the bedside    Manson Passey, MD  [facility]  Pager [phone]   Consultants:   central surgery  oncology Procedures:  Drainage 04/15/2013 Antibiotics:  Cefepime 04/13/2013 --> 04/15/13 Vancomycin 04/13/2013 --> 04/15/13    Discharge Exam: Filed Vitals:   04/15/13 1035  BP: 114/68  Pulse: 81  Temp: 98.8 F (37.1 C)  Resp: 18   Filed Vitals:   04/15/13 0625 04/15/13 0740 04/15/13 0800 04/15/13 1035  BP: 128/69 110/67  114/68  Pulse: 112 88  81  Temp:   98.7 F (37.1 C) 98.8 F (37.1 C)  TempSrc:   Oral Oral  Resp: 18 19  18   Height:    5\' 5"  (1.651 m)  Weight:    74.3 kg (163 lb 12.8 oz)  SpO2: 96% 97%  97%    General: Pt is alert, follows commands appropriately, not in acute distress Cardiovascular: Regular rate and rhythm, S1/S2 +, no murmurs, no rubs, no gallops Respiratory: Clear to auscultation bilaterally, no wheezing, no crackles, no rhonchi Abdominal: Soft, non tender, non distended, bowel sounds +, no guarding Extremities: no edema, no cyanosis, pulses palpable bilaterally DP  and PT Neuro: Grossly nonfocal  Discharge Instructions  Discharge Orders   Future Appointments Provider Department Dept Phone   04/22/2013 8:15 AM Dava Najjar Idelle Jo [facility] [phone]   04/22/2013 8:45 AM Augustin Schooling, NP [facility] [place] [facility] [phone]   04/22/2013 9:45 AM  [facility] [place] [facility] [phone]   04/29/2013 8:45 AM Delcie Roch [place] [facility] [phone]   04/29/2013 9:15 AM Augustin Schooling, NP [place] [facility] [phone]   04/29/2013 10:30 AM [facility] [place] [facility] [phone]   04/30/2013 8:30 AM [facility] Inj Nurse [place] [facility] [phone]   05/06/2013 8:15 AM Mauri Brooklyn [facility] [place] [facility] [phone]   05/06/2013 8:45 AM Augustin Schooling, NP [place] [facility] [phone]   05/06/2013 9:45 AM [facility] [place] [facility] [phone]   05/13/2013 8:15 AM Dava Najjar Idelle Jo [facility] [phone]   05/13/2013 8:45 AM Augustin Schooling, NP [place] [facility]COL[facility] [phone]   05/13/2013 9:45 AM [facility] [place] [facility] [phone]   05/20/2013 8:15 AM Dava Najjar [facility]elle [facility] MED[facility] ONCOL[facility] [phone]   05/20/2013 8:45 AM Augustin Schooling, NP [facility] MEDICAL ONCOLOGY [phone]   05/20/2013 9:45 AM [facility] [place] [facility] [phone]   05/21/2013 8:30 AM [facility] Inj Nurse [place] [facility] [phone]   05/26/2013 8:45 AM Beverely Pace [facility] [place] [facility]COL[facility] [phone]   05/26/2013 9:15 AM Augustin Schooling, NP [facility] MEDICAL ONCOLOGY [phone]   05/27/2013 10:30 AM [facility] [place] [facility] MED[facility] ONCOL[facility] [phone]   05/27/2013 1:00 PM [facility] ECHO LAB [phone]   05/27/2013 2:00 PM [facility] [facility] [phone]   06/03/2013 12:00 PM Krista Blue [facility]  MEDICAL ONCOLOGY [phone]   06/03/2013 12:30 PM Victorino December, MD [facility] MEDICAL ONCOLOGY [phone]   06/03/2013 1:30 PM [facility] [place] [facility] [phone]   06/24/2013 8:15 AM Beverely Pace [facility] [place] [facility] MED[facility] ONCOL[facility] [phone]   06/24/2013 8:45 AM Augustin Schooling, NP [place] [facility]COL[facility] [phone]   06/24/2013 9:45 AM [facility]  [facility] MEDICAL ONCOLOGY [phone]   Future Orders Complete By Expires     Call MD for:  difficulty breathing, headache or visual disturbances  As directed     Call MD for:  persistant dizziness or light-headedness  As directed     Call MD for:  persistant nausea and vomiting  As directed     Call MD for:  severe uncontrolled pain  As directed     Diet - low sodium heart healthy  As directed     Increase activity slowly  As directed         Medication List    TAKE these medications       albuterol 108 (90 BASE) MCG/ACT inhaler  Commonly known as:  PROVENTIL HFA;VENTOLIN HFA  Inhale 2 puffs into the lungs every 6 (six) hours as needed for shortness of breath.     amoxicillin-clavulanate 875-125 MG per tablet  Commonly known as:  AUGMENTIN  Take 1 tablet by mouth  2 (two) times daily.     B-complex with vitamin C tablet  Take 1 tablet by mouth daily.     bacitracin 500 UNIT/GM ointment  Apply 1 application topically 2 (two) times daily. Applied to wound on breast.     dexamethasone 4 MG tablet  Commonly known as:  DECADRON  Take 8 mg by mouth 2 (two) times daily with a meal. Taken day before, day of, and for 3 days after chemo.     dextroamphetamine 5 MG tablet  Commonly known as:  DEXTROSTAT  Take 2.5 mg by mouth 3 (three) times daily.     EPIPEN 0.3 mg/0.3 mL Devi  Generic drug:  EPINEPHrine  Inject 0.3 mg into the muscle as needed (for allergic reaction).     eszopiclone 2 MG Tabs  Commonly known as:  LUNESTA  Take 2  mg by mouth at bedtime as needed (for sleep).     levothyroxine 100 MCG tablet  Commonly known as:  SYNTHROID, LEVOTHROID  Take 100 mcg by mouth daily.     lidocaine-prilocaine cream  Commonly known as:  EMLA  Apply 1 application topically as needed (for port-a-cath access).     loratadine 10 MG tablet  Commonly known as:  CLARITIN  Take 10 mg by mouth daily as needed (for 5 days after Neulasta injection).     LORazepam 1 MG tablet  Commonly known as:  ATIVAN  Take 1 mg by mouth every 8 (eight) hours as needed for anxiety.     multivitamin with minerals Tabs  Take 1 tablet by mouth daily.     ondansetron 8 MG disintegrating tablet  Commonly known as:  ZOFRAN ODT  Take 1 tablet (8 mg total) by mouth every 8 (eight) hours as needed for nausea.     oxyCODONE 5 MG immediate release tablet  Commonly known as:  Oxy IR/ROXICODONE  Take 1 tablet (5 mg total) by mouth every 4 (four) hours as needed.     PRESCRIPTION MEDICATION  Inject into the vein as directed. Herceptin infusion weekly on Thursdays; Taxotere and Paraplatin q3weeks with Neulasta support.     prochlorperazine 10 MG tablet  Commonly known as:  COMPAZINE  Take 1 tablet (10 mg total) by mouth every 6 (six) hours as needed (Nausea or vomiting).     promethazine 25 MG tablet  Commonly known as:  PHENERGAN  Take 6.25-25 mg by mouth every 6 (six) hours as needed for nausea.     sennosides-docusate sodium 8.6-50 MG tablet  Commonly known as:  SENOKOT-S  Take 1 tablet by mouth daily as needed for constipation.     vitamin C 500 MG tablet  Commonly known as:  ASCORBIC ACID  Take 500 mg by mouth daily.           Follow-up Information   Follow up with [name], MD. (As needed)    Contact information:   [address] [place] [address] [phone]        The results of significant diagnostics from this hospitalization (including imaging, microbiology, ancillary and laboratory) are listed below  for reference.    Significant Diagnostic Studies: Dg Chest 2 View  04/13/2013  *RADIOLOGY REPORT*  Clinical Data: Fever.  Breast cancer.  CHEST - 2 VIEW  Comparison: 01/27/2013 and 10/25/2012  Findings: Power port appears in good position.  Bilateral breast implants for reconstruction.  The heart size and vascularity are normal.  Lungs are clear.  No osseous abnormality.  IMPRESSION: No  acute disease.   Original Report Authenticated By: Francene Boyers, M.D.    [place] Chest  04/14/2013  *RADIOLOGY REPORT*  Clinical Data: Left breast tenderness and erythema.  CHEST ULTRASOUND  Comparison: None.  Findings: Ultrasound does demonstrate a  fluid collection overlying the breast expander.  There is edema in the subcutaneous fat superficial to this fluid collection.  The collection is approximately 4 x 4 x 1 cm.  IMPRESSION:   Focal fluid collection slightly inferior and lateral to the site of the open sore on the left breast.  This could represent a seroma or abscess.  There is slight edema in the overlying subcutaneous fat.   Original Report Authenticated By: Francene Boyers, M.D.     Microbiology: Recent Results (from the past 240 hour(s))  CULTURE, BLOOD (ROUTINE X 2)     Status: None   Collection Time    04/13/13  8:35 PM      Result Value Range Status   Specimen Description BLOOD LEFT HAND   Final   Special Requests BOTTLES DRAWN AEROBIC AND ANAEROBIC 5CC   Final   Culture  Setup Time 04/14/2013 03:10   Final   Culture     Final   Value:        BLOOD CULTURE RECEIVED NO GROWTH TO DATE CULTURE WILL BE HELD FOR 5 DAYS BEFORE ISSUING A FINAL NEGATIVE REPORT   Report Status PENDING   Incomplete  CULTURE, BLOOD (ROUTINE X 2)     Status: None   Collection Time    04/13/13  8:40 PM      Result Value Range Status   Specimen Description BLOOD LEFT ARM   Final   Special Requests BOTTLES DRAWN AEROBIC AND ANAEROBIC 5CC   Final   Culture  Setup Time 04/14/2013 03:10   Final   Culture     Final   Value:         BLOOD CULTURE RECEIVED NO GROWTH TO DATE CULTURE WILL BE HELD FOR 5 DAYS BEFORE ISSUING A FINAL NEGATIVE REPORT   Report Status PENDING   Incomplete  URINE CULTURE     Status: None   Collection Time    04/13/13  8:54 PM      Result Value Range Status   Specimen Description URINE, CLEAN CATCH   Final   Special Requests Immunocompromised   Final   Culture  Setup Time 04/14/2013 07:02   Final   Colony Count NO GROWTH   Final   Culture NO GROWTH   Final   Report Status 04/15/2013 FINAL   Final  RAPID STREP SCREEN     Status: Abnormal   Collection Time    04/14/13  2:05 AM      Result Value Range Status   Streptococcus, Group A Screen (Direct) POSITIVE (*) NEGATIVE Final  MRSA PCR SCREENING     Status: None   Collection Time    04/14/13  3:16 AM      Result Value Range Status   MRSA by PCR NEGATIVE  NEGATIVE Final   Comment:            The GeneXpert MRSA Assay (FDA     approved for NASAL specimens     only), is one component of a     comprehensive MRSA colonization     surveillance program. It is not     intended to diagnose MRSA     infection nor to guide or     monitor treatment for  MRSA infections.     Labs: Basic Metabolic Panel:  Recent Labs Lab 04/13/13 2030 04/14/13 0430  NA 131* 137  K 3.7 3.9  CL 95* 103  CO2 27 27  GLUCOSE 102* 100*  BUN 20 13  CREATININE 0.50 0.55  CALCIUM 8.9 8.2*   Liver Function Tests:  Recent Labs Lab 04/13/13 2030  AST 26  ALT 36*  ALKPHOS 50  BILITOT 0.5  PROT 6.0  ALBUMIN 3.2*   No results found for this basename: LIPASE, AMYLASE,  in the last 168 hours No results found for this basename: AMMONIA,  in the last 168 hours CBC:  Recent Labs Lab 04/13/13 2030 04/14/13 0430  WBC 4.2 5.9  NEUTROABS 3.2  --   HGB 10.4* 9.7*  HCT 30.3* 28.2*  MCV 85.6 86.2  PLT 153 131*   Cardiac Enzymes: No results found for this basename: CKTOTAL, CKMB, CKMBINDEX, TROPONINI,  in the last 168 hours BNP: BNP (last 3  results) No results found for this basename: PROBNP,  in the last 8760 hours CBG: No results found for this basename: GLUCAP,  in the last 168 hours  Time coordinating discharge: Over 30 minutes  Signed:  Manson Passey, MD  [facility]  04/15/2013, 12:07 PM  Pager #: [phone]

## 2013-04-15 NOTE — Progress Notes (Signed)
Hand off report given to Stark Klein, Charity fundraiser.   Patient alert and oriented.  Transported via wheelchair to room 1317.

## 2013-04-17 LAB — WOUND CULTURE

## 2013-04-20 LAB — ANAEROBIC CULTURE

## 2013-04-20 LAB — CULTURE, BLOOD (ROUTINE X 2)
Culture: NO GROWTH
Culture: NO GROWTH

## 2013-04-22 ENCOUNTER — Ambulatory Visit (HOSPITAL_BASED_OUTPATIENT_CLINIC_OR_DEPARTMENT_OTHER): Payer: 59

## 2013-04-22 ENCOUNTER — Ambulatory Visit (HOSPITAL_BASED_OUTPATIENT_CLINIC_OR_DEPARTMENT_OTHER): Payer: 59 | Admitting: Adult Health

## 2013-04-22 ENCOUNTER — Encounter: Payer: Self-pay | Admitting: Adult Health

## 2013-04-22 ENCOUNTER — Other Ambulatory Visit: Payer: 59 | Admitting: Lab

## 2013-04-22 ENCOUNTER — Other Ambulatory Visit (HOSPITAL_BASED_OUTPATIENT_CLINIC_OR_DEPARTMENT_OTHER): Payer: 59 | Admitting: Lab

## 2013-04-22 VITALS — BP 99/59 | HR 99 | Temp 98.0°F | Resp 20 | Ht 65.0 in | Wt 159.7 lb

## 2013-04-22 DIAGNOSIS — C50419 Malignant neoplasm of upper-outer quadrant of unspecified female breast: Secondary | ICD-10-CM

## 2013-04-22 DIAGNOSIS — B373 Candidiasis of vulva and vagina: Secondary | ICD-10-CM

## 2013-04-22 DIAGNOSIS — Z170 Estrogen receptor positive status [ER+]: Secondary | ICD-10-CM

## 2013-04-22 DIAGNOSIS — D0512 Intraductal carcinoma in situ of left breast: Secondary | ICD-10-CM

## 2013-04-22 DIAGNOSIS — Z5112 Encounter for antineoplastic immunotherapy: Secondary | ICD-10-CM

## 2013-04-22 DIAGNOSIS — B3731 Acute candidiasis of vulva and vagina: Secondary | ICD-10-CM

## 2013-04-22 LAB — COMPREHENSIVE METABOLIC PANEL (CC13)
AST: 22 U/L (ref 5–34)
Albumin: 2.4 g/dL — ABNORMAL LOW (ref 3.5–5.0)
BUN: 17.5 mg/dL (ref 7.0–26.0)
CO2: 27 mEq/L (ref 22–29)
Calcium: 8.5 mg/dL (ref 8.4–10.4)
Chloride: 104 mEq/L (ref 98–107)
Creatinine: 0.6 mg/dL (ref 0.6–1.1)
Glucose: 98 mg/dl (ref 70–99)
Potassium: 3.9 mEq/L (ref 3.5–5.1)

## 2013-04-22 LAB — CBC WITH DIFFERENTIAL/PLATELET
BASO%: 0.2 % (ref 0.0–2.0)
Basophils Absolute: 0 10*3/uL (ref 0.0–0.1)
Eosinophils Absolute: 0 10*3/uL (ref 0.0–0.5)
HCT: 28.5 % — ABNORMAL LOW (ref 34.8–46.6)
HGB: 9.3 g/dL — ABNORMAL LOW (ref 11.6–15.9)
LYMPH%: 19 % (ref 14.0–49.7)
MCHC: 32.6 g/dL (ref 31.5–36.0)
MONO#: 0.9 10*3/uL (ref 0.1–0.9)
NEUT#: 7.1 10*3/uL — ABNORMAL HIGH (ref 1.5–6.5)
NEUT%: 71.7 % (ref 38.4–76.8)
Platelets: 219 10*3/uL (ref 145–400)
WBC: 9.9 10*3/uL (ref 3.9–10.3)
lymph#: 1.9 10*3/uL (ref 0.9–3.3)

## 2013-04-22 MED ORDER — HEPARIN SOD (PORK) LOCK FLUSH 100 UNIT/ML IV SOLN
500.0000 [IU] | Freq: Once | INTRAVENOUS | Status: AC | PRN
  Administered 2013-04-22: 500 [IU]
  Filled 2013-04-22: qty 5

## 2013-04-22 MED ORDER — SODIUM CHLORIDE 0.9 % IV SOLN
Freq: Once | INTRAVENOUS | Status: AC
  Administered 2013-04-22: 10:00:00 via INTRAVENOUS

## 2013-04-22 MED ORDER — ACETAMINOPHEN 325 MG PO TABS
650.0000 mg | ORAL_TABLET | Freq: Once | ORAL | Status: AC
  Administered 2013-04-22: 650 mg via ORAL

## 2013-04-22 MED ORDER — SODIUM CHLORIDE 0.9 % IJ SOLN
10.0000 mL | INTRAMUSCULAR | Status: DC | PRN
  Administered 2013-04-22: 10 mL
  Filled 2013-04-22: qty 10

## 2013-04-22 MED ORDER — LORAZEPAM 1 MG PO TABS: 1.0000 mg | ORAL_TABLET | Freq: Three times a day (TID) | ORAL | Status: DC | PRN

## 2013-04-22 MED ORDER — FLUCONAZOLE 200 MG PO TABS: 200.0000 mg | ORAL_TABLET | Freq: Every day | ORAL | Status: DC

## 2013-04-22 MED ORDER — TRASTUZUMAB CHEMO INJECTION 440 MG
2.0000 mg/kg | Freq: Once | INTRAVENOUS | Status: AC
  Administered 2013-04-22: 147 mg via INTRAVENOUS
  Filled 2013-04-22: qty 7

## 2013-04-22 NOTE — Patient Instructions (Signed)
Doing well.  Proceed with Herceptin.  Please call us if you have any questions or concerns.    

## 2013-04-22 NOTE — Progress Notes (Signed)
[facility]  [phone]    OFFICE PROGRESS NOTE  CC  [name], MD [address] [place] [address] Dr. Emelia Loron Dr. Chipper Herb Dr. Starleen Arms Dr. Dorothy Puffer  DIAGNOSIS: 57 year old female who presented on 12/24/2012 which new diagnosis of breast cancer  PRIOR THERAPY:  #1 patient has multiple medical problems but recently she had a mammogram performed that showed a 5.4 cm area of calcifications. MRI of the breasts showed the area extended to about 7.2 cm. Needle core biopsy performed showed a high-grade DCIS that was ER positive PR positive.  #2 she was seen by Dr. Emelia Loron regarding surgical options. Patient wanted to have bilateral mastectomies. She went on to have this performed on 12/31/2012. The final pathology did reveal a 2.3 cm invasive ductal carcinoma in the left breast. The tumor was ER +100% PR +86% HER-2/neu was amplified with a ratio of 6.81. Ki-67 was elevated at 46%. She has had immediate reconstruction.  #3 patient is seen back in medical oncology for discussion of adjuvant treatment. Since patient is HER-2/neu positive recommendation is her 2-based therapy consisting of Taxotere carboplatinum and Herceptin. The Taxotere or and carboplatinum will be given every 3 weeks for 6 cycles. She will receive Neulasta on day 2. Herceptin will be given weekly with duration of chemotherapy and then every 3 weeks to finish out a one-year of treatment. Patient also understands that she will be placed on antiestrogen therapy with an aromatase inhibitor or tamoxifen.  TCH was started on 02/04/13.  CURRENT THERAPY: Cycle 4 day 15 of TCH, with weekly Herceptin  INTERVAL HISTORY: Patient returns in followup today prior to her weekly herceptin.  She is doing well today.  She was recently hospitalized for a left breast abscess.  She became septic, required antibiotics, IV fluids, and draining.  She had an ultrasound in the  hospital, Dr. Kelly Splinter removed 50cc from the expander, and 2 ounces from behind the expander.  Aralynn then followed up with Dr. Kelly Splinter and 2 more ounces were removed, in the meantime it has continues to drain.  The wound was cultured on 4/24 and grew gram + cocci representing staph.  No other cultures were positive in the hospital.  She has had low grade fevers, her last fever was this past Monday.  Otherwise, a 10 point ROS is neg.    MEDICAL HISTORY: Past Medical History  Diagnosis Date  . IBS (irritable bowel syndrome)   . ADD (attention deficit disorder)   . Lyme disease   . Contact lens/glasses fitting     wears contacts or glasses  . Breast cancer 12/04/12    left, ER/PR +  . Allergy   . Asthma   . Thyroid disease   . Hypothyroidism     ALLERGIES:  is allergic to betadine; iodine; penicillins; shellfish-derived products; sulfamethoxazole w-trimethoprim; and cleocin.  MEDICATIONS:  Current Outpatient Prescriptions  Medication Sig Dispense Refill  . albuterol (PROVENTIL HFA;VENTOLIN HFA) 108 (90 BASE) MCG/ACT inhaler Inhale 2 puffs into the lungs every 6 (six) hours as needed for shortness of breath.       [name] amoxicillin-clavulanate (AUGMENTIN) 875-125 MG per tablet Take 1 tablet by mouth 2 (two) times daily.  10 tablet  0  . B Complex-C (B-COMPLEX WITH VITAMIN C) tablet Take 1 tablet by mouth daily.      . bacitracin 500 UNIT/GM ointment Apply 1 application topically 2 (two) times daily. Applied to wound on breast.      .  dexamethasone (DECADRON) 4 MG tablet Take 8 mg by mouth 2 (two) times daily with a meal. Taken day before, day of, and for 3 days after chemo.      [name] dextroamphetamine (DEXTROSTAT) 5 MG tablet Take 2.5 mg by mouth 3 (three) times daily.       [name] EPINEPHrine (EPIPEN) 0.3 mg/0.3 mL DEVI Inject 0.3 mg into the muscle as needed (for allergic reaction).       . eszopiclone (LUNESTA) 2 MG TABS Take 2 mg by mouth at bedtime as needed (for sleep).       [name] levothyroxine  (SYNTHROID, LEVOTHROID) 100 MCG tablet Take 100 mcg by mouth daily.      [name] lidocaine-prilocaine (EMLA) cream Apply 1 application topically as needed (for port-a-cath access).      [name] loratadine (CLARITIN) 10 MG tablet Take 10 mg by mouth daily as needed (for 5 days after Neulasta injection).      . LORazepam (ATIVAN) 1 MG tablet Take 1 tablet (1 mg total) by mouth every 8 (eight) hours as needed for anxiety.  30 tablet  0  . Multiple Vitamin (MULTIVITAMIN WITH MINERALS) TABS Take 1 tablet by mouth daily.      . ondansetron (ZOFRAN ODT) 8 MG disintegrating tablet Take 1 tablet (8 mg total) by mouth every 8 (eight) hours as needed for nausea.  20 tablet  5  . oxyCODONE (OXY IR/ROXICODONE) 5 MG immediate release tablet Take 1 tablet (5 mg total) by mouth every 4 (four) hours as needed.  30 tablet  0  . PRESCRIPTION MEDICATION Inject into the vein as directed. Herceptin infusion weekly on Thursdays; Taxotere and Paraplatin q3weeks with Neulasta support.      . prochlorperazine (COMPAZINE) 10 MG tablet Take 1 tablet (10 mg total) by mouth every 6 (six) hours as needed (Nausea or vomiting).  30 tablet  1  . promethazine (PHENERGAN) 25 MG tablet Take 6.25-25 mg by mouth every 6 (six) hours as needed for nausea.      . sennosides-docusate sodium (SENOKOT-S) 8.6-50 MG tablet Take 1 tablet by mouth daily as needed for constipation.       . vitamin C (ASCORBIC ACID) 500 MG tablet Take 500 mg by mouth daily.      . fluconazole (DIFLUCAN) 200 MG tablet Take 1 tablet (200 mg total) by mouth daily.  5 tablet  3   No current facility-administered medications for this visit.    SURGICAL HISTORY:  Past Surgical History  Procedure Laterality Date  . Brain surgery      removal benign frontal lobe cyst in 1987  . Foot surgery  2013    bilateral  . Gynecologic cryosurgery      cervical cancer, 1998 last remained disease free with recent normal pap  . Diagnostic laparoscopy  2004    lysis adh-uterine ablation  .  Breast surgery      bilateral mastopexy  . Breast surgery      removal benign left breast lesion  . Abdominal hysterectomy  2005    TAH/BSO-lysis adh, enometriosis  . Face lift    . Axillary sentinel node biopsy  12/31/2012    Procedure: AXILLARY SENTINEL NODE BIOPSY;  Surgeon: Emelia Loron, MD;  Location: [facility];  Service: General;  Laterality: Bilateral;  bilateral sentinel node  . Total mastectomy  12/31/2012    Procedure: TOTAL MASTECTOMY;  Surgeon: Emelia Loron, MD;  Location: [facility];  Service: General;  Laterality: Bilateral;  bilateral total mastectomies  . Breast reconstruction with placement of tissue expander and flex hd (acellular hydrated dermis)  12/31/2012    Procedure: BREAST RECONSTRUCTION WITH PLACEMENT OF TISSUE EXPANDER AND FLEX HD (ACELLULAR HYDRATED DERMIS);  Surgeon: Wayland Denis, DO;  Location: [facility];  Service: Plastics;  Laterality: Bilateral;  bilateral immediate breast reconstruction with expanders and flex hd   . Portacath placement  01/27/2013    Procedure: INSERTION PORT-A-CATH;  Surgeon: Emelia Loron, MD;  Location: [facility];  Service: General;  Laterality: Right;    REVIEW OF SYSTEMS:  General: fatigue (+), night sweats (-), fever (+), pain (-) Lymph: palpable nodes (-) HEENT: vision changes (-), mucositis (-), gum bleeding (-), epistaxis (-) Cardiovascular: chest pain (-), palpitations (-) Pulmonary: shortness of breath (-), dyspnea on exertion (-), cough (-), hemoptysis (-) GI:  Early satiety (-), melena (-), dysphagia (-), nausea/vomiting (-), diarrhea (-) GU: dysuria (-), hematuria (-), incontinence (-) Musculoskeletal: joint swelling (-), joint pain (-), back pain (-) Neuro: weakness (-), numbness (-), headache (-), confusion (-) Skin: Rash (-), lesions (-), dryness (-) Psych: depression (-), suicidal/homicidal ideation (-), feeling of hopelessness (-)  PHYSICAL  EXAMINATION:  Blood pressure 99/59, pulse 99, temperature 98 F (36.7 C), temperature source Oral, resp. rate 20, height 5\' 5"  (1.651 m), weight 159 lb 11.2 oz (72.439 kg). Body mass index is 26.58 kg/(m^2).   ECOG PERFORMANCE STATUS: 1 - Symptomatic but completely ambulatory  General: Patient is a well appearing female in no acute distress HEENT: PERRLA, sclerae anicteric no conjunctival pallor, MMM Neck: supple, no palpable adenopathy Lungs: clear to auscultation bilaterally, no wheezes, rhonchi, or rales. Right port negative for discharge, tenderness or erythema. Cardiovascular: regular rate rhythm, S1, S2, no murmurs, rubs or gallops Abdomen: Soft, non-tender, non-distended, normoactive bowel sounds, no HSM Extremities: warm and well perfused, no clubbing, cyanosis, or edema Skin: No rashes or lesions Neuro: Non-focal Breasts: She status post bilateral mastectomies with expander is in place. The cosmetic result is symmetrical.  Both axillae are benign. She has an approximately 0.5 cm open lesion at the lower central portion of the left breast, it is draining yellow serous fluids, and has yellow tissue present. There is minimal mild surrounding erythema, no foul odor present.    LABORATORY DATA: Lab Results  Component Value Date   WBC 9.9 04/22/2013   HGB 9.3* 04/22/2013   HCT 28.5* 04/22/2013   MCV 86.9 04/22/2013   PLT 219 04/22/2013      Chemistry      Component Value Date/Time   NA 137 04/14/2013 0430   NA 138 04/08/2013 0817   K 3.9 04/14/2013 0430   K 3.6 04/08/2013 0817   CL 103 04/14/2013 0430   CL 105 04/08/2013 0817   CO2 27 04/14/2013 0430   CO2 24 04/08/2013 0817   BUN 13 04/14/2013 0430   BUN 25.3 04/08/2013 0817   CREATININE 0.55 04/14/2013 0430   CREATININE 0.6 04/08/2013 0817      Component Value Date/Time   CALCIUM 8.2* 04/14/2013 0430   CALCIUM 8.8 04/08/2013 0817   ALKPHOS 50 04/13/2013 2030   ALKPHOS 48 04/08/2013 0817   AST 26 04/13/2013 2030   AST 20 04/08/2013 0817    ALT 36* 04/13/2013 2030   ALT 21 04/08/2013 0817   BILITOT 0.5 04/13/2013 2030   BILITOT 0.26 04/08/2013 0817     12/31/2012 ADDITIONAL INFORMATION: 1. CHROMOGENIC IN-SITU HYBRIDIZATION Interpretation: HER2/NEU BY CISH - SHOWS AMPLIFICATION BY  CISH ANALYSIS. THE RATIO OF HER2: CEP 17 SIGNALS WAS 6.81 Reference range: Ratio: HER2:CEP17 < 1.8 gene amplification not observed Ratio: HER2:CEP 17 1.8-2.2 - equivocal result Ratio: HER2:CEP17 > 2.2 - gene amplification observed Pecola Leisure MD Pathologist, Electronic Signature ( Signed 01/11/2013) 1. PROGNOSTIC INDICATORS - ACIS Results IMMUNOHISTOCHEMICAL AND MORPHOMETRIC ANALYSIS BY THE AUTOMATED CELLULAR IMAGING SYSTEM (ACIS) Estrogen Receptor (Negative, <1%): 99%, STRONG STAINING INTENSITY Progesterone Receptor (Negative, <1%): 10%, STRONG STAINING INTENSITY Proliferation Marker Ki67 by M IB-1 (Low<20%): 46% All controls stained appropriately Pecola Leisure MD Pathologist, Electronic Signature ( Signed 01/06/2013) 1 of 4 FINAL for HULL, Shoshanna G [id number]) FINAL DIAGNOSIS Diagnosis 1. Breast, simple mastectomy, Left - INVASIVE DUCTAL CARCINOMA, GRADE II (2.3 CM), SEE COMMENT. - LYMPHOVASCULAR INVASION IDENTIFIED - INVASIVE TUMOR IS 3 CM FROM THE NEAREST MARGIN (DEEP). - HIGH GRADE DUCTAL CARCINOMA IN SITU WITH COMEDO NECROSIS AND CALCIFICATIONS. - SEE TUMOR SYNOPTIC TEMPLATE BELOW. 2. Lymph node, sentinel, biopsy, Left axillary - ONE LYMPH NODE, NEGATIVE FOR TUMOR (0/1). 3. Breast, simple mastectomy, Right - BENIGN BREAST TISSUE, SEE COMMENT. - NEGATIVE FOR ATYPIA OR MALIGNANCY. 4. Lymph node, sentinel, biopsy, Right axillary - ONE LYMPH NODE, NEGATIVE FOR TUMOR (0/1). Microscopic Comment 1. BREAST, INVASIVE TUMOR, WITH LYMPH NODE SAMPLING Specimen, including laterality: Left breast Procedure: Simple mastectomy Grade: II of III Tubule formation: 3 Nuclear pleomorphism: 2 Mitotic: 1 Tumor size (gross measurement) 2.3  cm Margins: Invasive, distance to closest margin: 3.0 cm In-situ, distance to closest margin: 3.0 cm (deep) If margin positive, focally or broadly: N/A Lymphovascular invasion: Present, extensive Ductal carcinoma in situ: Present Grade: III of III Extensive intraductal component: Absent Lobular neoplasia: Absent Tumor focality: Unifocal Treatment effect: None If present, treatment effect in breast tissue, lymph nodes or both: N/A Extent of tumor: Skin: Grossly negative for tumor Nipple: Grossly negative for tumor Skeletal muscle: Microscopically negative for tumor Lymph nodes: # examined: 1 Lymph nodes with metastasis: 0 Breast prognostic profile: Estrogen receptor: Repeated, previous study shows 100% positivity. [phone]) Progesterone receptor: Repeated, previous study demonstrated 86% positivity ([id number]) Her 2 neu: Pending and will be reported in an addendum. Ki-67: Pending and will be reported in an addendum. Non-neoplastic breast: No significant findings. TNM: pT2, pN0, pMX Comments: None. (CR:kh 01-04-13) 3. The entire 2.0 cm previous biopsy hematoma is submitted for histopathologic review. On review, there is 2 of 4 FINAL for HULL, Matricia G [phone]) Microscopic Comment(continued) no in situ or invasive carcinoma identified in any of the slide sections examined. Additional non-neoplastic findings include benign fibrocystic change, pseudoangiomatous stromal hyperplasia, fibroadenomatoid nodules, periductal chronic inflammation with associated stromal fibrosis and microcalcifications in benign ducts and lobules. Pathologic Stage is pTX pN0, PMX. The surgical resection margin(s) of the specimen were inked and microscopically evaluated. [place] RU RADIOGRAPHIC STUDIES:  Mr Biopsy/wire Localization  12/25/2012  *RADIOLOGY REPORT*  Clinical Data:  6 mm enhancing mass in the upper right breast.  The patient has recent diagnosis of left breast DCIS.  MRI GUIDED VACUUM  ASSISTED BIOPSY OF THE RIGHT BREAST WITHOUT AND WITH CONTRAST  Comparison: Previous exams.  Technique: Multiplanar, multisequence MR images of the right breast were obtained prior to and following the intravenous administration of 15 ml of Mulithance.  I met with the patient, and we discussed the procedure of MRI guided biopsy, including risks, benefits, and alternatives. Specifically, we discussed the risks of infection, bleeding, tissue injury, clip migration, and inadequate sampling.  Informed, written consent was given.  Using sterile technique, 2% Lidocaine, MRI  guidance, and a 9 gauge vacuum assisted device, biopsy was performed of a 6 mm enhancing mass using a lateral to medial approach.  At the conclusion of the procedure, a tissue marker clip was deployed into the biopsy cavity.  IMPRESSION: MRI guided biopsy of the right breast. No apparent complications.  THREE-DIMENSIONAL MR IMAGE RENDERING ON INDEPENDENT WORKSTATION:  Three-dimensional MR images were rendered by post-processing of the original MR data on an independent workstation.  The three- dimensional MR images were interpreted, and findings were reported in the accompanying complete MRI report for this study.   Original Report Authenticated By: Britta Mccreedy, M.D.    Nm Sentinel Node Inj-no Rpt (breast)  12/31/2012  CLINICAL DATA: right axillary sentinel node biopsy   Sulfur colloid was injected intradermally by the nuclear medicine  technologist for breast cancer sentinel node localization.     Nm Sentinel Node Inj-no Rpt (breast)  12/31/2012  CLINICAL DATA: left axillary sentinel node biopsy   Sulfur colloid was injected intradermally by the nuclear medicine  technologist for breast cancer sentinel node localization.     Mm Digital Diagnostic Unilat R  12/25/2012  *RADIOLOGY REPORT*  Clinical Data:  MRI guided core needle biopsy of a 6 mm nodule of enhancement in the upper right breast was performed.  DIGITAL DIAGNOSTIC RIGHT MAMMOGRAM   Comparison:  Previous exams.  Findings:  Films are performed following MRI guided biopsy of 6 mm enhancing nodule in the upper central right breast.  A dumbbell shaped biopsy clip is present in the upper right breast just medial to the level of the nipple.  The clip is within a biopsy cavity that contains air and a small air-fluid level.  IMPRESSION: Satisfactory position of biopsy clip in the upper right breast.   Original Report Authenticated By: Britta Mccreedy, M.D.     ASSESSMENT: 57 year old [place] woman  (1) status post bilateral mastectomies in 12/31/2012 showing  (a) on the right, no evidence of malignancy  (b) on the left, a pT2 pN0, stage IIA invasive ductal carcinoma, grade 2, estrogen receptor 100% positive, progesterone receptor 86% positive, with an MIB-1 of 46%, and HER-2 amplification with a CISH ratio of 6.81  (2) starting adjuvant chemotherapy with carboplatin, docetaxel and trastuzumab February13 2014, to consist of 6 cycles.  #3 patient will proceed with cycle #4 of TCH today. Overall she is tolerating it well. She does develop neutropenia with dehydration and fatigue. We are supporting her through this.Her counts today are adequate.    PLAN:   #1 Proceed with Herceptin.  We will likely have to hold future chemotherapy until her wounds are healed.    #2 She will return in one week's time for Herceptin only. She does not wish to have Benadryl as premed. She states that in prior chemo dose she did not have the medicine per request. No allergic reactions occurred. Thus, she will not receive Benadryl again today.   #3 Since she is on Augmentin, I again prescribed Fluconazole today with additional refills if needed.      All questions were answered. The patient knows to call the clinic with any problems, questions or concerns.She has an appointment for follow up and [facility]. We can certainly see the patient much sooner if necessary  I  spent 25 minutes counseling the patient face to face.  The total time spent in the appointment was 30 minutes.   Cherie Ouch Lyn Hollingshead, NP Medical Oncology [facility]  Phone: [phone]

## 2013-04-22 NOTE — Patient Instructions (Addendum)

## 2013-04-26 ENCOUNTER — Encounter (HOSPITAL_BASED_OUTPATIENT_CLINIC_OR_DEPARTMENT_OTHER): Payer: Self-pay | Admitting: *Deleted

## 2013-04-26 ENCOUNTER — Telehealth: Payer: Self-pay | Admitting: Oncology

## 2013-04-26 NOTE — Progress Notes (Signed)
Has been here several times-infected lt br-sees dr Kelly Splinter tomorrow to decide if Tissue expander needs to come out If so-will stay overnight

## 2013-04-26 NOTE — Telephone Encounter (Signed)
, °

## 2013-04-27 ENCOUNTER — Other Ambulatory Visit: Payer: Self-pay | Admitting: Plastic Surgery

## 2013-04-27 DIAGNOSIS — S21002S Unspecified open wound of left breast, sequela: Secondary | ICD-10-CM

## 2013-04-27 NOTE — H&P (Signed)
Jane Gutierrez is an 56 y.o. female.   Chief Complaint: left breast wound HPI: The patient is a 56 yrs old wf here for history and physical. She underwent breast reconstruction with expanders and ADM placement. She has a left breast wound.  She was admitted two weeks ago for fever and found to have a strep throat. She had redness in the left breast area. We removed 50 cc of saline from the expander on the left and 50 cc of fluid from the lower breast area. This was replaced last week with 50 cc of NS. There is an ulcer in the vertical limb with the ADM exposed. This is still draining serous drainage. A routine screening mammogram followed by an MRI showed a lesion of the left breast with a biopsy-proven DCIS. The right breast showed an irregular enhancing area. She is 5 feet 5 inches tall and weighs 165 pounds. Her bra was a 36 B-C and she would like to be around a B. She is still getting chemotherapy. She has finished Augmentin but does not like the way it makes her feel. She is feeling better and the breast is no longer red.    Past Medical History  Diagnosis Date  . IBS (irritable bowel syndrome)   . ADD (attention deficit disorder)   . Lyme disease   . Contact lens/glasses fitting     wears contacts or glasses  . Breast cancer 12/04/12    left, ER/PR +  . Allergy   . Asthma   . Thyroid disease   . Hypothyroidism     Past Surgical History  Procedure Laterality Date  . Brain surgery      removal benign frontal lobe cyst in 1987  . Foot surgery  2013    bilateral  . Gynecologic cryosurgery      cervical cancer, 1998 last remained disease free with recent normal pap  . Diagnostic laparoscopy  2004    lysis adh-uterine ablation  . Breast surgery      bilateral mastopexy  . Breast surgery      removal benign left breast lesion  . Abdominal hysterectomy  2005    TAH/BSO-lysis adh, enometriosis  . Face lift    . Axillary sentinel node biopsy  12/31/2012    Procedure: AXILLARY SENTINEL  NODE BIOPSY;  Surgeon: Matthew Wakefield, MD;  Location: [facility];  Service: General;  Laterality: Bilateral;  bilateral sentinel node  . Total mastectomy  12/31/2012    Procedure: TOTAL MASTECTOMY;  Surgeon: Matthew Wakefield, MD;  Location: [facility];  Service: General;  Laterality: Bilateral;  bilateral total mastectomies  . Breast reconstruction with placement of tissue expander and flex hd (acellular hydrated dermis)  12/31/2012    Procedure: BREAST RECONSTRUCTION WITH PLACEMENT OF TISSUE EXPANDER AND FLEX HD (ACELLULAR HYDRATED DERMIS);  Surgeon: Claire Sanger, DO;  Location: [facility];  Service: Plastics;  Laterality: Bilateral;  bilateral immediate breast reconstruction with expanders and flex hd   . Portacath placement  01/27/2013    Procedure: INSERTION PORT-A-CATH;  Surgeon: Matthew Wakefield, MD;  Location: [facility];  Service: General;  Laterality: Right;    Family History  Problem Relation Age of Onset  . Cancer Mother 75    uterine, mult myeloma  . CVA Father   . Cancer Sister 44    breast  . Cancer Maternal Grandmother 70    breast   Social History:  reports that she has never smoked.   She has never used smokeless tobacco. She reports that  drinks alcohol. She reports that she does not use illicit drugs.  Allergies:  Allergies  Allergen Reactions  . Betadine (Povidone Iodine)   . Dilaudid (Hydromorphone Hcl) Nausea And Vomiting  . Iodine     REACTION: anaphylactic  . Penicillins   . Shellfish-Derived Products   . Sulfamethoxazole W-Trimethoprim   . Cleocin (Clindamycin Hcl) Rash     (Not in a hospital admission)  No results found for this or any previous visit (from the past 48 hour(s)). No results found.  Review of Systems  Constitutional: Negative.   HENT: Negative.   Eyes: Negative.   Respiratory: Negative.   Cardiovascular: Negative.   Gastrointestinal: Negative.   Genitourinary: Negative.    Musculoskeletal: Negative.   Skin: Negative.   Neurological: Negative.   Psychiatric/Behavioral: Negative.     There were no vitals taken for this visit. Physical Exam  Constitutional: She appears well-developed and well-nourished.  HENT:  Head: Normocephalic and atraumatic.  Eyes: Conjunctivae and EOM are normal. Pupils are equal, round, and reactive to light.  Neck: Normal range of motion.  Cardiovascular: Normal rate.   Respiratory: Effort normal.  GI: Soft.  Musculoskeletal: Normal range of motion.  Neurological: She is alert.  Skin: Skin is warm.  Psychiatric: She has a normal mood and affect. Her behavior is normal. Judgment and thought content normal.     Assessment/Plan Left breast wound- irrigation and debridement with possible removal of expander and ADM.  SANGER,CLAIRE 04/27/2013, 5:30 PM    

## 2013-04-28 ENCOUNTER — Ambulatory Visit (HOSPITAL_BASED_OUTPATIENT_CLINIC_OR_DEPARTMENT_OTHER): Payer: 59 | Admitting: Certified Registered"

## 2013-04-28 ENCOUNTER — Encounter (HOSPITAL_BASED_OUTPATIENT_CLINIC_OR_DEPARTMENT_OTHER)
Admission: RE | Disposition: A | Discharge status: Home / Self Care | Payer: Self-pay | Source: Ambulatory Visit | Attending: Plastic Surgery

## 2013-04-28 ENCOUNTER — Ambulatory Visit (HOSPITAL_BASED_OUTPATIENT_CLINIC_OR_DEPARTMENT_OTHER)
Admission: RE | Admit: 2013-04-28 | Discharge: 2013-04-28 | Disposition: A | Discharge status: Home / Self Care | Payer: 59 | Source: Ambulatory Visit | Attending: Plastic Surgery | Admitting: Plastic Surgery

## 2013-04-28 ENCOUNTER — Encounter (HOSPITAL_BASED_OUTPATIENT_CLINIC_OR_DEPARTMENT_OTHER): Payer: Self-pay | Admitting: Certified Registered"

## 2013-04-28 ENCOUNTER — Encounter (HOSPITAL_BASED_OUTPATIENT_CLINIC_OR_DEPARTMENT_OTHER): Payer: Self-pay

## 2013-04-28 DIAGNOSIS — J45909 Unspecified asthma, uncomplicated: Secondary | ICD-10-CM | POA: Insufficient documentation

## 2013-04-28 DIAGNOSIS — Z885 Allergy status to narcotic agent status: Secondary | ICD-10-CM | POA: Insufficient documentation

## 2013-04-28 DIAGNOSIS — Z803 Family history of malignant neoplasm of breast: Secondary | ICD-10-CM | POA: Insufficient documentation

## 2013-04-28 DIAGNOSIS — J309 Allergic rhinitis, unspecified: Secondary | ICD-10-CM | POA: Insufficient documentation

## 2013-04-28 DIAGNOSIS — F909 Attention-deficit hyperactivity disorder, unspecified type: Secondary | ICD-10-CM | POA: Insufficient documentation

## 2013-04-28 DIAGNOSIS — Z882 Allergy status to sulfonamides status: Secondary | ICD-10-CM | POA: Insufficient documentation

## 2013-04-28 DIAGNOSIS — Z888 Allergy status to other drugs, medicaments and biological substances status: Secondary | ICD-10-CM | POA: Insufficient documentation

## 2013-04-28 DIAGNOSIS — N641 Fat necrosis of breast: Secondary | ICD-10-CM | POA: Insufficient documentation

## 2013-04-28 DIAGNOSIS — Z880 Allergy status to penicillin: Secondary | ICD-10-CM | POA: Insufficient documentation

## 2013-04-28 DIAGNOSIS — Z170 Estrogen receptor positive status [ER+]: Secondary | ICD-10-CM | POA: Insufficient documentation

## 2013-04-28 DIAGNOSIS — D0590 Unspecified type of carcinoma in situ of unspecified breast: Secondary | ICD-10-CM | POA: Insufficient documentation

## 2013-04-28 DIAGNOSIS — E039 Hypothyroidism, unspecified: Secondary | ICD-10-CM | POA: Insufficient documentation

## 2013-04-28 DIAGNOSIS — S21002S Unspecified open wound of left breast, sequela: Secondary | ICD-10-CM

## 2013-04-28 DIAGNOSIS — Z9010 Acquired absence of unspecified breast and nipple: Secondary | ICD-10-CM | POA: Insufficient documentation

## 2013-04-28 DIAGNOSIS — K589 Irritable bowel syndrome without diarrhea: Secondary | ICD-10-CM | POA: Insufficient documentation

## 2013-04-28 HISTORY — PX: INCISION AND DRAINAGE OF WOUND: SHX1803

## 2013-04-28 LAB — POCT HEMOGLOBIN-HEMACUE: Hemoglobin: 11.6 g/dL — ABNORMAL LOW (ref 12.0–15.0)

## 2013-04-28 SURGERY — IRRIGATION AND DEBRIDEMENT WOUND
Anesthesia: General | Site: Breast | Laterality: Left | Wound class: Dirty or Infected

## 2013-04-28 MED ORDER — ACETAMINOPHEN 10 MG/ML IV SOLN
1000.0000 mg | Freq: Once | INTRAVENOUS | Status: AC
  Administered 2013-04-28: 1000 mg via INTRAVENOUS

## 2013-04-28 MED ORDER — LIDOCAINE HCL (CARDIAC) 20 MG/ML IV SOLN
INTRAVENOUS | Status: DC | PRN
  Administered 2013-04-28: 80 mg via INTRAVENOUS

## 2013-04-28 MED ORDER — MIDAZOLAM HCL 5 MG/5ML IJ SOLN
INTRAMUSCULAR | Status: DC | PRN
  Administered 2013-04-28: 1 mg via INTRAVENOUS

## 2013-04-28 MED ORDER — SCOPOLAMINE 1 MG/3DAYS TD PT72
1.0000 | MEDICATED_PATCH | TRANSDERMAL | Status: DC
  Administered 2013-04-28: 1.5 mg via TRANSDERMAL
  Administered 2013-04-28: 1 via TRANSDERMAL

## 2013-04-28 MED ORDER — PHENYLEPHRINE HCL 10 MG/ML IJ SOLN
INTRAMUSCULAR | Status: DC | PRN
  Administered 2013-04-28: 40 ug via INTRAVENOUS
  Administered 2013-04-28: 10 ug via INTRAVENOUS
  Administered 2013-04-28 (×2): 80 ug via INTRAVENOUS
  Administered 2013-04-28: 10 ug via INTRAVENOUS
  Administered 2013-04-28: 40 ug via INTRAVENOUS
  Administered 2013-04-28 (×2): 10 ug via INTRAVENOUS
  Administered 2013-04-28: 5 ug via INTRAVENOUS
  Administered 2013-04-28 (×2): 40 ug via INTRAVENOUS
  Administered 2013-04-28: 5 ug via INTRAVENOUS

## 2013-04-28 MED ORDER — SODIUM CHLORIDE 0.9 % IR SOLN
Status: DC | PRN
  Administered 2013-04-28 (×2)

## 2013-04-28 MED ORDER — FENTANYL CITRATE 0.05 MG/ML IJ SOLN
INTRAMUSCULAR | Status: DC | PRN
  Administered 2013-04-28: 25 ug via INTRAVENOUS
  Administered 2013-04-28: 50 ug via INTRAVENOUS

## 2013-04-28 MED ORDER — ONDANSETRON HCL 4 MG/2ML IJ SOLN: 4.0000 mg | Freq: Once | INTRAMUSCULAR | Status: DC | PRN

## 2013-04-28 MED ORDER — OXYCODONE HCL 5 MG/5ML PO SOLN
5.0000 mg | Freq: Once | ORAL | Status: DC | PRN
Start: 2013-04-28 — End: 2013-04-28

## 2013-04-28 MED ORDER — OXYCODONE HCL 5 MG PO TABS: 5.0000 mg | ORAL_TABLET | Freq: Once | ORAL | Status: DC | PRN

## 2013-04-28 MED ORDER — ACETAMINOPHEN 10 MG/ML IV SOLN
INTRAVENOUS | Status: DC | PRN
  Administered 2013-04-28: 1000 mg via INTRAVENOUS

## 2013-04-28 MED ORDER — PROPOFOL 10 MG/ML IV BOLUS
INTRAVENOUS | Status: DC | PRN
  Administered 2013-04-28: 200 mg via INTRAVENOUS

## 2013-04-28 MED ORDER — LACTATED RINGERS IV SOLN
INTRAVENOUS | Status: DC
  Administered 2013-04-28 (×2): via INTRAVENOUS

## 2013-04-28 MED ORDER — DEXAMETHASONE SODIUM PHOSPHATE 4 MG/ML IJ SOLN
INTRAMUSCULAR | Status: DC | PRN
  Administered 2013-04-28: 10 mg via INTRAVENOUS

## 2013-04-28 MED ORDER — ONDANSETRON HCL 4 MG/2ML IJ SOLN
INTRAMUSCULAR | Status: DC | PRN
  Administered 2013-04-28: 4 mg via INTRAVENOUS

## 2013-04-28 MED ORDER — CIPROFLOXACIN IN D5W 400 MG/200ML IV SOLN
400.0000 mg | INTRAVENOUS | Status: AC
  Administered 2013-04-28 (×2): 400 mg via INTRAVENOUS

## 2013-04-28 MED ORDER — LIDOCAINE-EPINEPHRINE 1 %-1:100000 IJ SOLN
INTRAMUSCULAR | Status: DC | PRN
  Administered 2013-04-28: 1 mL

## 2013-04-28 MED ORDER — HYDROMORPHONE HCL PF 1 MG/ML IJ SOLN
0.2500 mg | INTRAMUSCULAR | Status: DC | PRN
  Administered 2013-04-28 (×3): 0.25 mg via INTRAVENOUS

## 2013-04-28 SURGICAL SUPPLY — 72 items
BAG DECANTER FOR FLEXI CONT (MISCELLANEOUS) ×4 IMPLANT
BENZOIN TINCTURE PRP APPL 2/3 (GAUZE/BANDAGES/DRESSINGS) IMPLANT
BINDER BREAST LRG (GAUZE/BANDAGES/DRESSINGS) ×2 IMPLANT
BIOPATCH RED 1 DISK 7.0 (GAUZE/BANDAGES/DRESSINGS) ×2 IMPLANT
BLADE HEX COATED 2.75 (ELECTRODE) IMPLANT
BLADE SURG 10 STRL SS (BLADE) IMPLANT
BLADE SURG 15 STRL LF DISP TIS (BLADE) ×1 IMPLANT
BLADE SURG 15 STRL SS (BLADE) ×1
CANISTER OMNI JUG 16 LITER (MISCELLANEOUS) IMPLANT
CANISTER SUCTION 1200CC (MISCELLANEOUS) ×2 IMPLANT
CANISTER SUCTION 2500CC (MISCELLANEOUS) IMPLANT
CHLORAPREP W/TINT 26ML (MISCELLANEOUS) IMPLANT
CLOTH BEACON ORANGE TIMEOUT ST (SAFETY) ×2 IMPLANT
COVER MAYO STAND STRL (DRAPES) ×2 IMPLANT
COVER TABLE BACK 60X90 (DRAPES) ×2 IMPLANT
DECANTER SPIKE VIAL GLASS SM (MISCELLANEOUS) IMPLANT
DERMABOND ADVANCED (GAUZE/BANDAGES/DRESSINGS) ×1
DERMABOND ADVANCED .7 DNX12 (GAUZE/BANDAGES/DRESSINGS) ×1 IMPLANT
DRAIN CHANNEL 19F RND (DRAIN) ×2 IMPLANT
DRAIN PENROSE 1/2X12 LTX STRL (WOUND CARE) IMPLANT
DRAPE INCISE IOBAN 66X45 STRL (DRAPES) IMPLANT
DRAPE LAPAROSCOPIC ABDOMINAL (DRAPES) IMPLANT
DRAPE PED LAPAROTOMY (DRAPES) IMPLANT
DRSG ADAPTIC 3X8 NADH LF (GAUZE/BANDAGES/DRESSINGS) IMPLANT
DRSG EMULSION OIL 3X3 NADH (GAUZE/BANDAGES/DRESSINGS) IMPLANT
DRSG PAD ABDOMINAL 8X10 ST (GAUZE/BANDAGES/DRESSINGS) ×2 IMPLANT
ELECT REM PT RETURN 9FT ADLT (ELECTROSURGICAL) ×2
ELECTRODE REM PT RTRN 9FT ADLT (ELECTROSURGICAL) ×1 IMPLANT
EVACUATOR SILICONE 100CC (DRAIN) ×2 IMPLANT
GAUZE SPONGE 4X4 12PLY STRL LF (GAUZE/BANDAGES/DRESSINGS) IMPLANT
GAUZE XEROFORM 1X8 LF (GAUZE/BANDAGES/DRESSINGS) IMPLANT
GAUZE XEROFORM 5X9 LF (GAUZE/BANDAGES/DRESSINGS) IMPLANT
GLOVE BIO SURGEON STRL SZ 6.5 (GLOVE) ×8 IMPLANT
GLOVE BIOGEL PI IND STRL 7.0 (GLOVE) ×1 IMPLANT
GLOVE BIOGEL PI INDICATOR 7.0 (GLOVE) ×1
GOWN PREVENTION PLUS XLARGE (GOWN DISPOSABLE) ×6 IMPLANT
HANDPIECE INTERPULSE COAX TIP (DISPOSABLE)
IV NS IRRIG 3000ML ARTHROMATIC (IV SOLUTION) IMPLANT
NEEDLE 27GAX1X1/2 (NEEDLE) ×2 IMPLANT
NS IRRIG 1000ML POUR BTL (IV SOLUTION) IMPLANT
PACK BASIN DAY SURGERY FS (CUSTOM PROCEDURE TRAY) ×2 IMPLANT
PENCIL BUTTON HOLSTER BLD 10FT (ELECTRODE) ×2 IMPLANT
PIN SAFETY STERILE (MISCELLANEOUS) ×2 IMPLANT
SET COLLECT BLD 23X.75 12 LT B (MISCELLANEOUS) ×4 IMPLANT
SET HNDPC FAN SPRY TIP SCT (DISPOSABLE) IMPLANT
SHEET MEDIUM DRAPE 40X70 STRL (DRAPES) IMPLANT
SLEEVE SCD COMPRESS KNEE MED (MISCELLANEOUS) ×2 IMPLANT
SPONGE GAUZE 4X4 12PLY (GAUZE/BANDAGES/DRESSINGS) ×2 IMPLANT
SPONGE LAP 18X18 X RAY DECT (DISPOSABLE) ×4 IMPLANT
SPONGE LAP 4X18 X RAY DECT (DISPOSABLE) IMPLANT
STAPLER VISISTAT 35W (STAPLE) IMPLANT
STRIP CLOSURE SKIN 1/2X4 (GAUZE/BANDAGES/DRESSINGS) IMPLANT
SUCTION FRAZIER TIP 10 FR DISP (SUCTIONS) IMPLANT
SURGILUBE 2OZ TUBE FLIPTOP (MISCELLANEOUS) IMPLANT
SUT MNCRL AB 4-0 PS2 18 (SUTURE) ×4 IMPLANT
SUT PDS 3-0 CT2 (SUTURE) ×2
SUT PDS II 3-0 CT2 27 ABS (SUTURE) ×1 IMPLANT
SUT PROLENE 3 0 PS 2 (SUTURE) IMPLANT
SUT SILK 3 0 PS 1 (SUTURE) ×2 IMPLANT
SUT SILK 3 0 SH 30 (SUTURE) IMPLANT
SUT VIC AB 3-0 FS2 27 (SUTURE) IMPLANT
SUT VIC AB 5-0 PS2 18 (SUTURE) IMPLANT
SUT VICRYL 4-0 PS2 18IN ABS (SUTURE) IMPLANT
SYR 50ML LL SCALE MARK (SYRINGE) ×2 IMPLANT
SYR BULB IRRIGATION 50ML (SYRINGE) IMPLANT
SYR CONTROL 10ML LL (SYRINGE) ×2 IMPLANT
TAPE HYPAFIX 6X30 (GAUZE/BANDAGES/DRESSINGS) IMPLANT
TOWEL OR 17X24 6PK STRL BLUE (TOWEL DISPOSABLE) ×2 IMPLANT
TRAY DSU PREP LF (CUSTOM PROCEDURE TRAY) ×2 IMPLANT
TUBE CONNECTING 20X1/4 (TUBING) ×2 IMPLANT
UNDERPAD 30X30 INCONTINENT (UNDERPADS AND DIAPERS) ×2 IMPLANT
YANKAUER SUCT BULB TIP NO VENT (SUCTIONS) ×2 IMPLANT

## 2013-04-28 NOTE — Interval H&P Note (Signed)
History and Physical Interval Note:  04/28/2013 8:27 AM  Jane Gutierrez  has presented today for surgery, with the diagnosis of POSSIBLE INFECTION TISSUE EXPANDER LEFT BREAST/BREAST CANCER  The various methods of treatment have been discussed with the patient and family. After consideration of risks, benefits and other options for treatment, the patient has consented to  Procedure(s): IRRIGATION AND DEBRIDEMENT LEFT BREAST WOUND, POSSIBLE CLOSURE OF WOUND WITH POSSIBLE REMOVAL OF TISSUE EXPANDER (Left) as a surgical intervention .  The patient's history has been reviewed, patient examined, no change in status, stable for surgery.  I have reviewed the patient's chart and labs.  Questions were answered to the patient's satisfaction.     SANGER,Rosamaria Donn

## 2013-04-28 NOTE — Op Note (Signed)
Op report   SURGICAL DIVISION: Plastic Surgery  PREOPERATIVE DIAGNOSES:  1. Left Breast wound 2. Acquired absence of bilateral breast due to breast cancer.   POSTOPERATIVE DIAGNOSES:  1. Left Breast wound 2. Acquired absence of bilateral breast due to breast cancer.   PROCEDURE:  1. Excision of left breast wound and primary closure  SURGEON: Wayland Denis, DO  ASSISTANT: Shawn Rayburn, PA  ANESTHESIA:  General.   COMPLICATIONS: None.   INDICATIONS FOR PROCEDURE:  The patient has a history of bilateral mastectomies and had tissue expanders placed at the time of mastectomies. She now presents for excision of a wound on the left breast.   CONSENT:  Informed consent was obtained directly from the patient. Risks, benefits and alternatives were fully discussed. Specific risks including but not limited to bleeding, infection, hematoma, seroma, scarring, pain, implant infection, implant extrusion, capsular contracture, asymmetry, wound healing problems, and need for further surgery were all discussed. The patient did have an ample opportunity to have her questions answered to her satisfaction.   DESCRIPTION OF PROCEDURE:  The patient was taken to the operating room. SCDs were placed and IV antibiotics were given. The patient's chest was prepped and draped in a sterile fashion. A time out was performed.  One percent Xylocaine with epinephrine was used to infiltrate the area.   The left breast wound was excised in an eliptical fashion.  The entire pocket was irrigated with antibiotic saline.  The ADM and pectoralis muscle had separated at the medial aspect.  The pectoralis major muscle and ADM were reapproximated with 3-0 PDS.  Hemostasis was achieved with electrocautery and the pocket adjacent to the ADM was bovied.  The expander was evacuated of 60 cc of the normal saline fluid to decrease tension on the repair.  A drain was placed within the expander pocket and breast pocket. It was sutured  to the chest with 3-0 silk.   The remaining skin was closed with 4-0 Monocryl followed by 5-0 Monocryl at the skin level. Derma bond was applied.  There was no sign of purulence.  A breast binder and ABD were placed. The patient was allowed to wake up and taken to the recovery room in satisfactory condition.

## 2013-04-28 NOTE — Anesthesia Postprocedure Evaluation (Signed)
  Anesthesia Post-op Note  Patient: Jane Gutierrez  Procedure(s) Performed: Procedure(s): IRRIGATION AND DEBRIDEMENT LEFT BREAST WOUND, POSSIBLE CLOSURE OF WOUND WITH DRAIN PLACEMENT (Left)  Patient Location: PACU  Anesthesia Type:General  Level of Consciousness: awake, alert  and oriented  Airway and Oxygen Therapy: Patient Spontanous Breathing  Post-op Pain: mild  Post-op Assessment: Post-op Vital signs reviewed  Post-op Vital Signs: Reviewed  Complications: No apparent anesthesia complications

## 2013-04-28 NOTE — Anesthesia Procedure Notes (Signed)
Procedure Name: LMA Insertion Date/Time: 04/28/2013 8:35 AM Performed by: Verlan Friends Pre-anesthesia Checklist: Patient identified, Emergency Drugs available, Suction available, Patient being monitored and Timeout performed Patient Re-evaluated:Patient Re-evaluated prior to inductionOxygen Delivery Method: Circle System Utilized Preoxygenation: Pre-oxygenation with 100% oxygen Intubation Type: IV induction Ventilation: Mask ventilation without difficulty LMA: LMA inserted LMA Size: 4.0 Number of attempts: 1 Airway Equipment and Method: bite block Placement Confirmation: positive ETCO2 Tube secured with: Tape Dental Injury: Teeth and Oropharynx as per pre-operative assessment

## 2013-04-28 NOTE — Brief Op Note (Signed)
04/28/2013  9:39 AM  PATIENT:  Jane Gutierrez  57 y.o. female  PRE-OPERATIVE DIAGNOSIS:  BREAST/BREAST CANCER  POST-OPERATIVE DIAGNOSIS:  POSSIBLE INFECTION TISSUE EXPANDER LEFT   PROCEDURE:  Procedure(s): IRRIGATION AND DEBRIDEMENT LEFT BREAST WOUND, POSSIBLE CLOSURE OF WOUND WITH DRAIN PLACEMENT (Left)  SURGEON:  Surgeon(s) and Role:    * Tamara Kenyon Sanger, DO - Primary  PHYSICIAN ASSISTANT: Shawn Rayburn, PA  ASSISTANTS: none   ANESTHESIA:   general  EBL:  Total I/O In: 1000 [I.V.:1000] Out: -   BLOOD ADMINISTERED:none  DRAINS: (1) Jackson-Pratt drain(s) with closed bulb suction in the left breast   LOCAL MEDICATIONS USED:  LIDOCAINE   SPECIMEN:  Source of Specimen:  left breast scar tissue  DISPOSITION OF SPECIMEN:  PATHOLOGY  COUNTS:  YES  TOURNIQUET:  * No tourniquets in log *  DICTATION: .Dragon Dictation  PLAN OF CARE: Discharge to home after PACU  PATIENT DISPOSITION:  PACU - hemodynamically stable.   Delay start of Pharmacological VTE agent (>24hrs) due to surgical blood loss or risk of bleeding: no

## 2013-04-28 NOTE — Transfer of Care (Signed)
Immediate Anesthesia Transfer of Care Note  Patient: Jane Gutierrez  Procedure(s) Performed: Procedure(s): IRRIGATION AND DEBRIDEMENT LEFT BREAST WOUND, POSSIBLE CLOSURE OF WOUND WITH DRAIN PLACEMENT (Left)  Patient Location: PACU  Anesthesia Type:General  Level of Consciousness: awake, alert , oriented and patient cooperative  Airway & Oxygen Therapy: Patient Spontanous Breathing and Patient connected to face mask oxygen  Post-op Assessment: Report given to PACU RN and Post -op Vital signs reviewed and stable  Post vital signs: Reviewed and stable  Complications: No apparent anesthesia complications

## 2013-04-28 NOTE — Anesthesia Preprocedure Evaluation (Signed)
Anesthesia Evaluation  Patient identified by MRN, date of birth, ID band Patient awake    Reviewed: Allergy & Precautions, H&P , NPO status , Patient's Chart, lab work & pertinent test results  Airway Mallampati: I TM Distance: >3 FB Neck ROM: Full    Dental  (+) Partial Upper   Pulmonary asthma ,  breath sounds clear to auscultation        Cardiovascular Rhythm:Regular Rate:Normal     Neuro/Psych    GI/Hepatic   Endo/Other    Renal/GU      Musculoskeletal   Abdominal   Peds  Hematology   Anesthesia Other Findings   Reproductive/Obstetrics                           Anesthesia Physical Anesthesia Plan  ASA: II  Anesthesia Plan: General   Post-op Pain Management:    Induction: Intravenous  Airway Management Planned: LMA  Additional Equipment:   Intra-op Plan:   Post-operative Plan: Extubation in OR  Informed Consent: I have reviewed the patients History and Physical, chart, labs and discussed the procedure including the risks, benefits and alternatives for the proposed anesthesia with the patient or authorized representative who has indicated his/her understanding and acceptance.   Dental advisory given  Plan Discussed with: CRNA, Anesthesiologist and Surgeon  Anesthesia Plan Comments:         Anesthesia Quick Evaluation

## 2013-04-28 NOTE — H&P (View-Only) (Signed)
Jane Gutierrez is an 57 y.o. female.   Chief Complaint: left breast wound HPI: The patient is a 57 yrs old wf here for history and physical. She underwent breast reconstruction with expanders and ADM placement. She has a left breast wound.  She was admitted two weeks ago for fever and found to have a strep throat. She had redness in the left breast area. We removed 50 cc of saline from the expander on the left and 50 cc of fluid from the lower breast area. This was replaced last week with 50 cc of NS. There is an ulcer in the vertical limb with the ADM exposed. This is still draining serous drainage. A routine screening mammogram followed by an MRI showed a lesion of the left breast with a biopsy-proven DCIS. The right breast showed an irregular enhancing area. She is 5 feet 5 inches tall and weighs 165 pounds. Her bra was a 36 B-C and she would like to be around a B. She is still getting chemotherapy. She has finished Augmentin but does not like the way it makes her feel. She is feeling better and the breast is no longer red.    Past Medical History  Diagnosis Date  . IBS (irritable bowel syndrome)   . ADD (attention deficit disorder)   . Lyme disease   . Contact lens/glasses fitting     wears contacts or glasses  . Breast cancer 12/04/12    left, ER/PR +  . Allergy   . Asthma   . Thyroid disease   . Hypothyroidism     Past Surgical History  Procedure Laterality Date  . Brain surgery      removal benign frontal lobe cyst in 1987  . Foot surgery  2013    bilateral  . Gynecologic cryosurgery      cervical cancer, 1998 last remained disease free with recent normal pap  . Diagnostic laparoscopy  2004    lysis adh-uterine ablation  . Breast surgery      bilateral mastopexy  . Breast surgery      removal benign left breast lesion  . Abdominal hysterectomy  2005    TAH/BSO-lysis adh, enometriosis  . Face lift    . Axillary sentinel node biopsy  12/31/2012    Procedure: AXILLARY SENTINEL  NODE BIOPSY;  Surgeon: Emelia Loron, MD;  Location: [facility];  Service: General;  Laterality: Bilateral;  bilateral sentinel node  . Total mastectomy  12/31/2012    Procedure: TOTAL MASTECTOMY;  Surgeon: Emelia Loron, MD;  Location: [facility];  Service: General;  Laterality: Bilateral;  bilateral total mastectomies  . Breast reconstruction with placement of tissue expander and flex hd (acellular hydrated dermis)  12/31/2012    Procedure: BREAST RECONSTRUCTION WITH PLACEMENT OF TISSUE EXPANDER AND FLEX HD (ACELLULAR HYDRATED DERMIS);  Surgeon: Wayland Denis, DO;  Location: [facility];  Service: Plastics;  Laterality: Bilateral;  bilateral immediate breast reconstruction with expanders and flex hd   . Portacath placement  01/27/2013    Procedure: INSERTION PORT-A-CATH;  Surgeon: Emelia Loron, MD;  Location: [facility];  Service: General;  Laterality: Right;    Family History  Problem Relation Age of Onset  . Cancer Mother 66    uterine, mult myeloma  . CVA Father   . Cancer Sister 51    breast  . Cancer Maternal Grandmother 47    breast   Social History:  reports that she has never smoked.  She has never used smokeless tobacco. She reports that  drinks alcohol. She reports that she does not use illicit drugs.  Allergies:  Allergies  Allergen Reactions  . Betadine (Povidone Iodine)   . Dilaudid (Hydromorphone Hcl) Nausea And Vomiting  . Iodine     REACTION: anaphylactic  . Penicillins   . Shellfish-Derived Products   . Sulfamethoxazole W-Trimethoprim   . Cleocin (Clindamycin Hcl) Rash     (Not in a hospital admission)  No results found for this or any previous visit (from the past 48 hour(s)). No results found.  Review of Systems  Constitutional: Negative.   HENT: Negative.   Eyes: Negative.   Respiratory: Negative.   Cardiovascular: Negative.   Gastrointestinal: Negative.   Genitourinary: Negative.    Musculoskeletal: Negative.   Skin: Negative.   Neurological: Negative.   Psychiatric/Behavioral: Negative.     There were no vitals taken for this visit. Physical Exam  Constitutional: She appears well-developed and well-nourished.  HENT:  Head: Normocephalic and atraumatic.  Eyes: Conjunctivae and EOM are normal. Pupils are equal, round, and reactive to light.  Neck: Normal range of motion.  Cardiovascular: Normal rate.   Respiratory: Effort normal.  GI: Soft.  Musculoskeletal: Normal range of motion.  Neurological: She is alert.  Skin: Skin is warm.  Psychiatric: She has a normal mood and affect. Her behavior is normal. Judgment and thought content normal.     Assessment/Plan Left breast wound- irrigation and debridement with possible removal of expander and ADM.  SANGER,Laney Bagshaw 04/27/2013, 5:30 PM

## 2013-04-29 ENCOUNTER — Other Ambulatory Visit: Payer: 59 | Admitting: Lab

## 2013-04-29 ENCOUNTER — Ambulatory Visit: Payer: 59

## 2013-04-29 ENCOUNTER — Ambulatory Visit: Payer: 59 | Admitting: Adult Health

## 2013-04-30 ENCOUNTER — Ambulatory Visit: Payer: 59

## 2013-05-04 ENCOUNTER — Encounter: Payer: Self-pay | Admitting: Adult Health

## 2013-05-04 ENCOUNTER — Telehealth: Payer: Self-pay

## 2013-05-04 NOTE — Telephone Encounter (Signed)
LMOVM regarding pts email via MyChart. Left reminder of appt with LA on the 15th. Call office with any questions. TMB

## 2013-05-06 ENCOUNTER — Other Ambulatory Visit: Payer: 59 | Admitting: Lab

## 2013-05-06 ENCOUNTER — Ambulatory Visit (HOSPITAL_BASED_OUTPATIENT_CLINIC_OR_DEPARTMENT_OTHER): Payer: 59

## 2013-05-06 ENCOUNTER — Encounter (HOSPITAL_BASED_OUTPATIENT_CLINIC_OR_DEPARTMENT_OTHER): Payer: Self-pay | Admitting: Plastic Surgery

## 2013-05-06 ENCOUNTER — Ambulatory Visit (HOSPITAL_BASED_OUTPATIENT_CLINIC_OR_DEPARTMENT_OTHER): Payer: 59 | Admitting: Oncology

## 2013-05-06 ENCOUNTER — Other Ambulatory Visit (HOSPITAL_BASED_OUTPATIENT_CLINIC_OR_DEPARTMENT_OTHER): Payer: 59 | Admitting: Lab

## 2013-05-06 VITALS — BP 109/67 | HR 88 | Temp 98.2°F | Resp 20 | Ht 65.0 in | Wt 160.3 lb

## 2013-05-06 DIAGNOSIS — Z170 Estrogen receptor positive status [ER+]: Secondary | ICD-10-CM

## 2013-05-06 DIAGNOSIS — C50419 Malignant neoplasm of upper-outer quadrant of unspecified female breast: Secondary | ICD-10-CM

## 2013-05-06 DIAGNOSIS — D0510 Intraductal carcinoma in situ of unspecified breast: Secondary | ICD-10-CM

## 2013-05-06 DIAGNOSIS — Z5112 Encounter for antineoplastic immunotherapy: Secondary | ICD-10-CM

## 2013-05-06 LAB — COMPREHENSIVE METABOLIC PANEL (CC13)
AST: 24 U/L (ref 5–34)
Alkaline Phosphatase: 44 U/L (ref 40–150)
BUN: 18.7 mg/dL (ref 7.0–26.0)
Glucose: 95 mg/dl (ref 70–99)
Sodium: 138 mEq/L (ref 136–145)
Total Bilirubin: 0.24 mg/dL (ref 0.20–1.20)
Total Protein: 6.1 g/dL — ABNORMAL LOW (ref 6.4–8.3)

## 2013-05-06 LAB — CBC WITH DIFFERENTIAL/PLATELET
BASO%: 0.2 % (ref 0.0–2.0)
Basophils Absolute: 0 10*3/uL (ref 0.0–0.1)
EOS%: 1.3 % (ref 0.0–7.0)
HGB: 9.9 g/dL — ABNORMAL LOW (ref 11.6–15.9)
MCH: 29.3 pg (ref 25.1–34.0)
MCHC: 32.2 g/dL (ref 31.5–36.0)
MCV: 90.8 fL (ref 79.5–101.0)
MONO%: 11.6 % (ref 0.0–14.0)
RDW: 18.7 % — ABNORMAL HIGH (ref 11.2–14.5)

## 2013-05-06 MED ORDER — ACETAMINOPHEN 325 MG PO TABS
650.0000 mg | ORAL_TABLET | Freq: Once | ORAL | Status: AC
  Administered 2013-05-06: 650 mg via ORAL

## 2013-05-06 MED ORDER — SODIUM CHLORIDE 0.9 % IJ SOLN
10.0000 mL | INTRAMUSCULAR | Status: DC | PRN
  Administered 2013-05-06: 10 mL
  Filled 2013-05-06: qty 10

## 2013-05-06 MED ORDER — TRASTUZUMAB CHEMO INJECTION 440 MG
2.0000 mg/kg | Freq: Once | INTRAVENOUS | Status: AC
  Administered 2013-05-06: 147 mg via INTRAVENOUS
  Filled 2013-05-06: qty 7

## 2013-05-06 MED ORDER — HEPARIN SOD (PORK) LOCK FLUSH 100 UNIT/ML IV SOLN
500.0000 [IU] | Freq: Once | INTRAVENOUS | Status: AC | PRN
  Administered 2013-05-06: 500 [IU]
  Filled 2013-05-06: qty 5

## 2013-05-06 MED ORDER — SODIUM CHLORIDE 0.9 % IV SOLN
Freq: Once | INTRAVENOUS | Status: AC
  Administered 2013-05-06: 09:00:00 via INTRAVENOUS

## 2013-05-06 NOTE — Patient Instructions (Addendum)
Call MD for problems or concerns 

## 2013-05-06 NOTE — Progress Notes (Signed)
[facility]  [phone]    OFFICE PROGRESS NOTE  CC  [name], MD [address] [place] [address] Dr. Emelia Loron Dr. Chipper Herb Dr. Starleen Arms Dr. Dorothy Puffer  DIAGNOSIS: 57 year old female who presented on 12/24/2012 which new diagnosis of breast cancer  PRIOR THERAPY:  #1 patient has multiple medical problems but recently she had a mammogram performed that showed a 5.4 cm area of calcifications. MRI of the breasts showed the area extended to about 7.2 cm. Needle core biopsy performed showed a high-grade DCIS that was ER positive PR positive.  #2 she was seen by Dr. Emelia Loron regarding surgical options. Patient wanted to have bilateral mastectomies. She went on to have this performed on 12/31/2012. The final pathology did reveal a 2.3 cm invasive ductal carcinoma in the left breast. The tumor was ER +100% PR +86% HER-2/neu was amplified with a ratio of 6.81. Ki-67 was elevated at 46%. She has had immediate reconstruction.  #3 patient is seen back in medical oncology for discussion of adjuvant treatment. Since patient is HER-2/neu positive recommendation is her 2-based therapy consisting of Taxotere carboplatinum and Herceptin. The Taxotere or and carboplatinum will be given every 3 weeks for 6 cycles. She will receive Neulasta on day 2. Herceptin will be given weekly with duration of chemotherapy and then every 3 weeks to finish out a one-year of treatment. Patient also understands that she will be placed on antiestrogen therapy with an aromatase inhibitor or tamoxifen.  TCH was started on 02/04/13.  CURRENT THERAPY:  weekly Herceptin  INTERVAL HISTORY: Patient returns in followup today prior to her weekly herceptin.  She is doing well today Noble then followed up with Dr. Kelly Splinter and 2 more ounces were removed, in the meantime it has continues to drain.  The wound was cultured on 4/24 and grew gram + cocci representing staph.   No other cultures were positive in the hospital.  She has had low grade fevers, her last fever was this past Monday.  Otherwise, a 10 point ROS is neg.    MEDICAL HISTORY: Past Medical History  Diagnosis Date  . IBS (irritable bowel syndrome)   . ADD (attention deficit disorder)   . Lyme disease   . Contact lens/glasses fitting     wears contacts or glasses  . Breast cancer 12/04/12    left, ER/PR +  . Allergy   . Asthma   . Thyroid disease   . Hypothyroidism     ALLERGIES:  is allergic to betadine; dilaudid; iodine; penicillins; shellfish-derived products; sulfamethoxazole w-trimethoprim; and cleocin.  MEDICATIONS:  Current Outpatient Prescriptions  Medication Sig Dispense Refill  . albuterol (PROVENTIL HFA;VENTOLIN HFA) 108 (90 BASE) MCG/ACT inhaler Inhale 2 puffs into the lungs every 6 (six) hours as needed for shortness of breath.       [name] amoxicillin-clavulanate (AUGMENTIN) 875-125 MG per tablet Take 1 tablet by mouth 2 (two) times daily.  10 tablet  0  . B Complex-C (B-COMPLEX WITH VITAMIN C) tablet Take 1 tablet by mouth daily.      . bacitracin 500 UNIT/GM ointment Apply 1 application topically 2 (two) times daily. Applied to wound on breast.      . dexamethasone (DECADRON) 4 MG tablet Take 8 mg by mouth 2 (two) times daily with a meal. Taken day before, day of, and for 3 days after chemo.      [name] dextroamphetamine (DEXTROSTAT) 5 MG tablet Take 2.5 mg by mouth 3 (three)  times daily.       [name] EPINEPHrine (EPIPEN) 0.3 mg/0.3 mL DEVI Inject 0.3 mg into the muscle as needed (for allergic reaction).       . eszopiclone (LUNESTA) 2 MG TABS Take 2 mg by mouth at bedtime as needed (for sleep).       . fluconazole (DIFLUCAN) 200 MG tablet Take 1 tablet (200 mg total) by mouth daily.  5 tablet  3  . levothyroxine (SYNTHROID, LEVOTHROID) 100 MCG tablet Take 100 mcg by mouth daily.      [name] lidocaine-prilocaine (EMLA) cream Apply 1 application topically as needed (for port-a-cath access).       [name] loratadine (CLARITIN) 10 MG tablet Take 10 mg by mouth daily as needed (for 5 days after Neulasta injection).      . LORazepam (ATIVAN) 1 MG tablet Take 1 tablet (1 mg total) by mouth every 8 (eight) hours as needed for anxiety.  30 tablet  0  . Multiple Vitamin (MULTIVITAMIN WITH MINERALS) TABS Take 1 tablet by mouth daily.      . ondansetron (ZOFRAN ODT) 8 MG disintegrating tablet Take 1 tablet (8 mg total) by mouth every 8 (eight) hours as needed for nausea.  20 tablet  5  . oxyCODONE (OXY IR/ROXICODONE) 5 MG immediate release tablet Take 1 tablet (5 mg total) by mouth every 4 (four) hours as needed.  30 tablet  0  . PRESCRIPTION MEDICATION Inject into the vein as directed. Herceptin infusion weekly on Thursdays; Taxotere and Paraplatin q3weeks with Neulasta support.      . prochlorperazine (COMPAZINE) 10 MG tablet Take 1 tablet (10 mg total) by mouth every 6 (six) hours as needed (Nausea or vomiting).  30 tablet  1  . promethazine (PHENERGAN) 25 MG tablet Take 6.25-25 mg by mouth every 6 (six) hours as needed for nausea.      . sennosides-docusate sodium (SENOKOT-S) 8.6-50 MG tablet Take 1 tablet by mouth daily as needed for constipation.       . vitamin C (ASCORBIC ACID) 500 MG tablet Take 500 mg by mouth daily.       No current facility-administered medications for this visit.    SURGICAL HISTORY:  Past Surgical History  Procedure Laterality Date  . Brain surgery      removal benign frontal lobe cyst in 1987  . Foot surgery  2013    bilateral  . Gynecologic cryosurgery      cervical cancer, 1998 last remained disease free with recent normal pap  . Diagnostic laparoscopy  2004    lysis adh-uterine ablation  . Breast surgery      bilateral mastopexy  . Breast surgery      removal benign left breast lesion  . Abdominal hysterectomy  2005    TAH/BSO-lysis adh, enometriosis  . Face lift    . Axillary sentinel node biopsy  12/31/2012    Procedure: AXILLARY SENTINEL NODE BIOPSY;   Surgeon: Emelia Loron, MD;  Location: [facility];  Service: General;  Laterality: Bilateral;  bilateral sentinel node  . Total mastectomy  12/31/2012    Procedure: TOTAL MASTECTOMY;  Surgeon: Emelia Loron, MD;  Location: [facility];  Service: General;  Laterality: Bilateral;  bilateral total mastectomies  . Breast reconstruction with placement of tissue expander and flex hd (acellular hydrated dermis)  12/31/2012    Procedure: BREAST RECONSTRUCTION WITH PLACEMENT OF TISSUE EXPANDER AND FLEX HD (ACELLULAR HYDRATED DERMIS);  Surgeon: Wayland Denis, DO;  Location: [facility];  Service: Plastics;  Laterality: Bilateral;  bilateral immediate breast reconstruction with expanders and flex hd   . Portacath placement  01/27/2013    Procedure: INSERTION PORT-A-CATH;  Surgeon: Emelia Loron, MD;  Location: [facility];  Service: General;  Laterality: Right;  . Incision and drainage of wound Left 04/28/2013    Procedure: IRRIGATION AND DEBRIDEMENT LEFT BREAST WOUND, POSSIBLE CLOSURE OF WOUND WITH DRAIN PLACEMENT;  Surgeon: Wayland Denis, DO;  Location: [facility];  Service: Plastics;  Laterality: Left;    REVIEW OF SYSTEMS:  General: fatigue (+), night sweats (-), fever (+), pain (-) Lymph: palpable nodes (-) HEENT: vision changes (-), mucositis (-), gum bleeding (-), epistaxis (-) Cardiovascular: chest pain (-), palpitations (-) Pulmonary: shortness of breath (-), dyspnea on exertion (-), cough (-), hemoptysis (-) GI:  Early satiety (-), melena (-), dysphagia (-), nausea/vomiting (-), diarrhea (-) GU: dysuria (-), hematuria (-), incontinence (-) Musculoskeletal: joint swelling (-), joint pain (-), back pain (-) Neuro: weakness (-), numbness (-), headache (-), confusion (-) Skin: Rash (-), lesions (-), dryness (-) Psych: depression (-), suicidal/homicidal ideation (-), feeling of hopelessness (-)  PHYSICAL  EXAMINATION:  Blood pressure 109/67, pulse 88, temperature 98.2 F (36.8 C), temperature source Oral, resp. rate 20, height 5\' 5"  (1.651 m), weight 160 lb 4.8 oz (72.712 kg). Body mass index is 26.68 kg/(m^2).   ECOG PERFORMANCE STATUS: 1 - Symptomatic but completely ambulatory  General: Patient is a well appearing female in no acute distress HEENT: PERRLA, sclerae anicteric no conjunctival pallor, MMM Neck: supple, no palpable adenopathy Lungs: clear to auscultation bilaterally, no wheezes, rhonchi, or rales. Right port negative for discharge, tenderness or erythema. Cardiovascular: regular rate rhythm, S1, S2, no murmurs, rubs or gallops Abdomen: Soft, non-tender, non-distended, normoactive bowel sounds, no HSM Extremities: warm and well perfused, no clubbing, cyanosis, or edema Skin: No rashes or lesions Neuro: Non-focal Breasts: She status post bilateral mastectomies with expander is in place. The cosmetic result is symmetrical.  Both axillae are benign. She has an approximately 0.5 cm open lesion at the lower central portion of the left breast, it is draining yellow serous fluids, and has yellow tissue present. There is minimal mild surrounding erythema, no foul odor present.    LABORATORY DATA: Lab Results  Component Value Date   WBC 5.4 05/06/2013   HGB 9.9* 05/06/2013   HCT 30.7* 05/06/2013   MCV 90.8 05/06/2013   PLT 314 05/06/2013      Chemistry      Component Value Date/Time   NA 137 04/22/2013 0824   NA 137 04/14/2013 0430   K 3.9 04/22/2013 0824   K 3.9 04/14/2013 0430   CL 104 04/22/2013 0824   CL 103 04/14/2013 0430   CO2 27 04/22/2013 0824   CO2 27 04/14/2013 0430   BUN 17.5 04/22/2013 0824   BUN 13 04/14/2013 0430   CREATININE 0.6 04/22/2013 0824   CREATININE 0.55 04/14/2013 0430      Component Value Date/Time   CALCIUM 8.5 04/22/2013 0824   CALCIUM 8.2* 04/14/2013 0430   ALKPHOS 46 04/22/2013 0824   ALKPHOS 50 04/13/2013 2030   AST 22 04/22/2013 0824   AST 26 04/13/2013 2030    ALT 32 04/22/2013 0824   ALT 36* 04/13/2013 2030   BILITOT 0.24 04/22/2013 0824   BILITOT 0.5 04/13/2013 2030     12/31/2012 ADDITIONAL INFORMATION: 1. CHROMOGENIC IN-SITU HYBRIDIZATION Interpretation: HER2/NEU BY CISH - SHOWS AMPLIFICATION BY CISH ANALYSIS. THE  RATIO OF HER2: CEP 17 SIGNALS WAS 6.81 Reference range: Ratio: HER2:CEP17 < 1.8 gene amplification not observed Ratio: HER2:CEP 17 1.8-2.2 - equivocal result Ratio: HER2:CEP17 > 2.2 - gene amplification observed Pecola Leisure MD Pathologist, Electronic Signature ( Signed 01/11/2013) 1. PROGNOSTIC INDICATORS - ACIS Results IMMUNOHISTOCHEMICAL AND MORPHOMETRIC ANALYSIS BY THE AUTOMATED CELLULAR IMAGING SYSTEM (ACIS) Estrogen Receptor (Negative, <1%): 99%, STRONG STAINING INTENSITY Progesterone Receptor (Negative, <1%): 10%, STRONG STAINING INTENSITY Proliferation Marker Ki67 by M IB-1 (Low<20%): 46% All controls stained appropriately Pecola Leisure MD Pathologist, Electronic Signature ( Signed 01/06/2013) 1 of 4 FINAL for HULL, Nivea G [phone]) FINAL DIAGNOSIS Diagnosis 1. Breast, simple mastectomy, Left - INVASIVE DUCTAL CARCINOMA, GRADE II (2.3 CM), SEE COMMENT. - LYMPHOVASCULAR INVASION IDENTIFIED - INVASIVE TUMOR IS 3 CM FROM THE NEAREST MARGIN (DEEP). - HIGH GRADE DUCTAL CARCINOMA IN SITU WITH COMEDO NECROSIS AND CALCIFICATIONS. - SEE TUMOR SYNOPTIC TEMPLATE BELOW. 2. Lymph node, sentinel, biopsy, Left axillary - ONE LYMPH NODE, NEGATIVE FOR TUMOR (0/1). 3. Breast, simple mastectomy, Right - BENIGN BREAST TISSUE, SEE COMMENT. - NEGATIVE FOR ATYPIA OR MALIGNANCY. 4. Lymph node, sentinel, biopsy, Right axillary - ONE LYMPH NODE, NEGATIVE FOR TUMOR (0/1). Microscopic Comment 1. BREAST, INVASIVE TUMOR, WITH LYMPH NODE SAMPLING Specimen, including laterality: Left breast Procedure: Simple mastectomy Grade: II of III Tubule formation: 3 Nuclear pleomorphism: 2 Mitotic: 1 Tumor size (gross measurement) 2.3  cm Margins: Invasive, distance to closest margin: 3.0 cm In-situ, distance to closest margin: 3.0 cm (deep) If margin positive, focally or broadly: N/A Lymphovascular invasion: Present, extensive Ductal carcinoma in situ: Present Grade: III of III Extensive intraductal component: Absent Lobular neoplasia: Absent Tumor focality: Unifocal Treatment effect: None If present, treatment effect in breast tissue, lymph nodes or both: N/A Extent of tumor: Skin: Grossly negative for tumor Nipple: Grossly negative for tumor Skeletal muscle: Microscopically negative for tumor Lymph nodes: # examined: 1 Lymph nodes with metastasis: 0 Breast prognostic profile: Estrogen receptor: Repeated, previous study shows 100% positivity. [phone]) Progesterone receptor: Repeated, previous study demonstrated 86% positivity ([id number]) Her 2 neu: Pending and will be reported in an addendum. Ki-67: Pending and will be reported in an addendum. Non-neoplastic breast: No significant findings. TNM: pT2, pN0, pMX Comments: None. (CR:kh 01-04-13) 3. The entire 2.0 cm previous biopsy hematoma is submitted for histopathologic review. On review, there is 2 of 4 FINAL for HULL, Mayerli G [phone]) Microscopic Comment(continued) no in situ or invasive carcinoma identified in any of the slide sections examined. Additional non-neoplastic findings include benign fibrocystic change, pseudoangiomatous stromal hyperplasia, fibroadenomatoid nodules, periductal chronic inflammation with associated stromal fibrosis and microcalcifications in benign ducts and lobules. Pathologic Stage is pTX pN0, PMX. The surgical resection margin(s) of the specimen were inked and microscopically evaluated. [place] RU RADIOGRAPHIC STUDIES:  Mr Biopsy/wire Localization  12/25/2012  *RADIOLOGY REPORT*  Clinical Data:  6 mm enhancing mass in the upper right breast.  The patient has recent diagnosis of left breast DCIS.  MRI GUIDED VACUUM  ASSISTED BIOPSY OF THE RIGHT BREAST WITHOUT AND WITH CONTRAST  Comparison: Previous exams.  Technique: Multiplanar, multisequence MR images of the right breast were obtained prior to and following the intravenous administration of 15 ml of Mulithance.  I met with the patient, and we discussed the procedure of MRI guided biopsy, including risks, benefits, and alternatives. Specifically, we discussed the risks of infection, bleeding, tissue injury, clip migration, and inadequate sampling.  Informed, written consent was given.  Using sterile technique, 2% Lidocaine, MRI guidance, and a  9 gauge vacuum assisted device, biopsy was performed of a 6 mm enhancing mass using a lateral to medial approach.  At the conclusion of the procedure, a tissue marker clip was deployed into the biopsy cavity.  IMPRESSION: MRI guided biopsy of the right breast. No apparent complications.  THREE-DIMENSIONAL MR IMAGE RENDERING ON INDEPENDENT WORKSTATION:  Three-dimensional MR images were rendered by post-processing of the original MR data on an independent workstation.  The three- dimensional MR images were interpreted, and findings were reported in the accompanying complete MRI report for this study.   Original Report Authenticated By: Britta Mccreedy, M.D.    Nm Sentinel Node Inj-no Rpt (breast)  12/31/2012  CLINICAL DATA: right axillary sentinel node biopsy   Sulfur colloid was injected intradermally by the nuclear medicine  technologist for breast cancer sentinel node localization.     Nm Sentinel Node Inj-no Rpt (breast)  12/31/2012  CLINICAL DATA: left axillary sentinel node biopsy   Sulfur colloid was injected intradermally by the nuclear medicine  technologist for breast cancer sentinel node localization.     Mm Digital Diagnostic Unilat R  12/25/2012  *RADIOLOGY REPORT*  Clinical Data:  MRI guided core needle biopsy of a 6 mm nodule of enhancement in the upper right breast was performed.  DIGITAL DIAGNOSTIC RIGHT MAMMOGRAM   Comparison:  Previous exams.  Findings:  Films are performed following MRI guided biopsy of 6 mm enhancing nodule in the upper central right breast.  A dumbbell shaped biopsy clip is present in the upper right breast just medial to the level of the nipple.  The clip is within a biopsy cavity that contains air and a small air-fluid level.  IMPRESSION: Satisfactory position of biopsy clip in the upper right breast.   Original Report Authenticated By: Britta Mccreedy, M.D.     ASSESSMENT: 57 year old [place] woman  (1) status post bilateral mastectomies in 12/31/2012 showing  (a) on the right, no evidence of malignancy  (b) on the left, a pT2 pN0, stage IIA invasive ductal carcinoma, grade 2, estrogen receptor 100% positive, progesterone receptor 86% positive, with an MIB-1 of 46%, and HER-2 amplification with a CISH ratio of 6.81  (2) starting adjuvant chemotherapy with carboplatin, docetaxel and trastuzumab February13 2014, to consist of 6 cycles.  #3 patient will proceed with Herceptin today.   #4 verall she is tolerating it well. She does develop neutropenia with dehydration and fatigue. We are supporting her through this.Her counts today are adequate.    PLAN:   #1 Proceed with Herceptin.     #2 She will return in one week's time for [facility]  #3 Since she is on Augmentin, I again prescribed Fluconazole today with additional refills if needed.      All questions were answered. The patient knows to call the clinic with any problems, questions or concerns. We can certainly see the patient much sooner if necessary  I spent 25 minutes counseling the patient face to face.  The total time spent in the appointment was 30 minutes.  Drue Second, MD Medical/Oncology [facility] [phone] (beeper) [phone] (Office)  05/06/2013, 9:11 AM

## 2013-05-07 ENCOUNTER — Telehealth: Payer: Self-pay | Admitting: Oncology

## 2013-05-07 NOTE — Telephone Encounter (Signed)
, °

## 2013-05-10 ENCOUNTER — Other Ambulatory Visit: Payer: Self-pay | Admitting: *Deleted

## 2013-05-10 DIAGNOSIS — C50419 Malignant neoplasm of upper-outer quadrant of unspecified female breast: Secondary | ICD-10-CM

## 2013-05-11 ENCOUNTER — Telehealth: Payer: Self-pay | Admitting: *Deleted

## 2013-05-11 MED ORDER — DEXAMETHASONE 4 MG PO TABS: ORAL_TABLET | ORAL | Status: DC

## 2013-05-11 NOTE — Telephone Encounter (Signed)
I have called and left the patient a message to call me back. Need to move injection appt on 5/30 later due to a meeting. JMW

## 2013-05-13 ENCOUNTER — Other Ambulatory Visit (HOSPITAL_BASED_OUTPATIENT_CLINIC_OR_DEPARTMENT_OTHER): Payer: 59 | Admitting: Lab

## 2013-05-13 ENCOUNTER — Ambulatory Visit: Payer: 59 | Admitting: Lab

## 2013-05-13 ENCOUNTER — Ambulatory Visit (HOSPITAL_BASED_OUTPATIENT_CLINIC_OR_DEPARTMENT_OTHER): Payer: 59

## 2013-05-13 ENCOUNTER — Other Ambulatory Visit: Payer: Self-pay | Admitting: Emergency Medicine

## 2013-05-13 ENCOUNTER — Ambulatory Visit (HOSPITAL_BASED_OUTPATIENT_CLINIC_OR_DEPARTMENT_OTHER): Payer: 59 | Admitting: Oncology

## 2013-05-13 ENCOUNTER — Other Ambulatory Visit: Payer: 59 | Admitting: Lab

## 2013-05-13 VITALS — BP 108/65 | HR 80 | Temp 97.3°F | Resp 20 | Ht 65.0 in | Wt 158.4 lb

## 2013-05-13 DIAGNOSIS — Z5111 Encounter for antineoplastic chemotherapy: Secondary | ICD-10-CM

## 2013-05-13 DIAGNOSIS — Z5112 Encounter for antineoplastic immunotherapy: Secondary | ICD-10-CM

## 2013-05-13 DIAGNOSIS — E860 Dehydration: Secondary | ICD-10-CM

## 2013-05-13 DIAGNOSIS — E039 Hypothyroidism, unspecified: Secondary | ICD-10-CM

## 2013-05-13 DIAGNOSIS — Z170 Estrogen receptor positive status [ER+]: Secondary | ICD-10-CM

## 2013-05-13 DIAGNOSIS — C50419 Malignant neoplasm of upper-outer quadrant of unspecified female breast: Secondary | ICD-10-CM

## 2013-05-13 DIAGNOSIS — B373 Candidiasis of vulva and vagina: Secondary | ICD-10-CM

## 2013-05-13 DIAGNOSIS — B3731 Acute candidiasis of vulva and vagina: Secondary | ICD-10-CM

## 2013-05-13 DIAGNOSIS — D709 Neutropenia, unspecified: Secondary | ICD-10-CM

## 2013-05-13 LAB — CBC WITH DIFFERENTIAL/PLATELET
BASO%: 0.2 % (ref 0.0–2.0)
EOS%: 0.2 % (ref 0.0–7.0)
HGB: 10.6 g/dL — ABNORMAL LOW (ref 11.6–15.9)
MCHC: 32.7 g/dL (ref 31.5–36.0)
MCV: 89.3 fL (ref 79.5–101.0)
MONO#: 0.9 10*3/uL (ref 0.1–0.9)
NEUT#: 5.5 10*3/uL (ref 1.5–6.5)
NEUT%: 63.2 % (ref 38.4–76.8)
Platelets: 296 10*3/uL (ref 145–400)
RBC: 3.63 10*6/uL — ABNORMAL LOW (ref 3.70–5.45)
RDW: 17.1 % — ABNORMAL HIGH (ref 11.2–14.5)
lymph#: 2.2 10*3/uL (ref 0.9–3.3)
nRBC: 0 % (ref 0–0)

## 2013-05-13 LAB — COMPREHENSIVE METABOLIC PANEL (CC13)
ALT: 22 U/L (ref 0–55)
AST: 18 U/L (ref 5–34)
Albumin: 3.1 g/dL — ABNORMAL LOW (ref 3.5–5.0)
Alkaline Phosphatase: 42 U/L (ref 40–150)
Calcium: 8.9 mg/dL (ref 8.4–10.4)
Chloride: 105 mEq/L (ref 98–107)
Potassium: 3.8 mEq/L (ref 3.5–5.1)
Sodium: 140 mEq/L (ref 136–145)
Total Protein: 6.4 g/dL (ref 6.4–8.3)

## 2013-05-13 MED ORDER — TRASTUZUMAB CHEMO INJECTION 440 MG
2.0000 mg/kg | Freq: Once | INTRAVENOUS | Status: AC
  Administered 2013-05-13: 147 mg via INTRAVENOUS
  Filled 2013-05-13: qty 7

## 2013-05-13 MED ORDER — PROMETHAZINE HCL 25 MG PO TABS: ORAL_TABLET | ORAL | Status: DC

## 2013-05-13 MED ORDER — SODIUM CHLORIDE 0.9 % IV SOLN
66.0000 mg/m2 | Freq: Once | INTRAVENOUS | Status: AC
  Administered 2013-05-13: 120 mg via INTRAVENOUS
  Filled 2013-05-13: qty 12

## 2013-05-13 MED ORDER — DEXAMETHASONE SODIUM PHOSPHATE 20 MG/5ML IJ SOLN
20.0000 mg | Freq: Once | INTRAMUSCULAR | Status: AC
  Administered 2013-05-13: 20 mg via INTRAVENOUS

## 2013-05-13 MED ORDER — SODIUM CHLORIDE 0.9 % IV SOLN
Freq: Once | INTRAVENOUS | Status: AC
  Administered 2013-05-13: 10:00:00 via INTRAVENOUS

## 2013-05-13 MED ORDER — FLUCONAZOLE 200 MG PO TABS: 200.0000 mg | ORAL_TABLET | Freq: Every day | ORAL | Status: DC

## 2013-05-13 MED ORDER — PROCHLORPERAZINE 25 MG RE SUPP: 25.0000 mg | Freq: Two times a day (BID) | RECTAL | Status: DC | PRN

## 2013-05-13 MED ORDER — ACETAMINOPHEN 325 MG PO TABS
650.0000 mg | ORAL_TABLET | Freq: Once | ORAL | Status: AC
  Administered 2013-05-13: 650 mg via ORAL

## 2013-05-13 MED ORDER — HEPARIN SOD (PORK) LOCK FLUSH 100 UNIT/ML IV SOLN
500.0000 [IU] | Freq: Once | INTRAVENOUS | Status: AC | PRN
  Administered 2013-05-13: 500 [IU]
  Filled 2013-05-13: qty 5

## 2013-05-13 MED ORDER — ONDANSETRON 16 MG/50ML IVPB (CHCC)
16.0000 mg | Freq: Once | INTRAVENOUS | Status: AC
  Administered 2013-05-13: 16 mg via INTRAVENOUS

## 2013-05-13 MED ORDER — SODIUM CHLORIDE 0.9 % IJ SOLN
10.0000 mL | INTRAMUSCULAR | Status: DC | PRN
  Administered 2013-05-13: 10 mL
  Filled 2013-05-13: qty 10

## 2013-05-13 MED ORDER — SODIUM CHLORIDE 0.9 % IV SOLN
600.0000 mg | Freq: Once | INTRAVENOUS | Status: AC
  Administered 2013-05-13: 600 mg via INTRAVENOUS
  Filled 2013-05-13: qty 60

## 2013-05-13 NOTE — Patient Instructions (Addendum)
[facility] Discharge Instructions for Patients Receiving Chemotherapy  Today you received the following chemotherapy agents: Taxotere, Carboplatin, Herceptin  To help prevent nausea and vomiting after your treatment, we encourage you to take your nausea medication as often as prescribed ( zofran, compazine, phenergan)  If you develop nausea and vomiting that is not controlled by your nausea medication, call the clinic.  BELOW ARE SYMPTOMS THAT SHOULD BE REPORTED IMMEDIATELY:  *FEVER GREATER THAN 100.5 F  *CHILLS WITH OR WITHOUT FEVER  NAUSEA AND VOMITING THAT IS NOT CONTROLLED WITH YOUR NAUSEA MEDICATION  *UNUSUAL SHORTNESS OF BREATH  *UNUSUAL BRUISING OR BLEEDING  TENDERNESS IN MOUTH AND THROAT WITH OR WITHOUT PRESENCE OF ULCERS  *URINARY PROBLEMS  *BOWEL PROBLEMS  UNUSUAL RASH Items with * indicate a potential emergency and should be followed up as soon as possible.  Feel free to call the clinic you have any questions or concerns. The clinic phone number is [phone].   I have been informed and understand all the instructions given to me. I know to contact the clinic, my physician, or go to the Emergency Department if any problems should occur. I do not have any questions at this time, but understand that I may call the clinic during office hours   should I have any questions or need assistance in obtaining follow up care.

## 2013-05-14 ENCOUNTER — Ambulatory Visit (HOSPITAL_BASED_OUTPATIENT_CLINIC_OR_DEPARTMENT_OTHER): Payer: 59

## 2013-05-14 VITALS — BP 125/79 | HR 65 | Temp 98.2°F

## 2013-05-14 DIAGNOSIS — C50419 Malignant neoplasm of upper-outer quadrant of unspecified female breast: Secondary | ICD-10-CM

## 2013-05-14 DIAGNOSIS — Z5189 Encounter for other specified aftercare: Secondary | ICD-10-CM

## 2013-05-14 MED ORDER — PEGFILGRASTIM INJECTION 6 MG/0.6ML
6.0000 mg | Freq: Once | SUBCUTANEOUS | Status: AC
  Administered 2013-05-14: 6 mg via SUBCUTANEOUS
  Filled 2013-05-14: qty 0.6

## 2013-05-20 ENCOUNTER — Other Ambulatory Visit (HOSPITAL_BASED_OUTPATIENT_CLINIC_OR_DEPARTMENT_OTHER): Payer: 59 | Admitting: Lab

## 2013-05-20 ENCOUNTER — Encounter: Payer: Self-pay | Admitting: Adult Health

## 2013-05-20 ENCOUNTER — Other Ambulatory Visit: Payer: 59 | Admitting: Lab

## 2013-05-20 ENCOUNTER — Ambulatory Visit (HOSPITAL_BASED_OUTPATIENT_CLINIC_OR_DEPARTMENT_OTHER): Payer: 59

## 2013-05-20 ENCOUNTER — Ambulatory Visit (HOSPITAL_BASED_OUTPATIENT_CLINIC_OR_DEPARTMENT_OTHER): Payer: 59 | Admitting: Adult Health

## 2013-05-20 VITALS — BP 105/66 | HR 77 | Temp 98.6°F | Resp 20 | Ht 65.0 in | Wt 153.0 lb

## 2013-05-20 DIAGNOSIS — C50419 Malignant neoplasm of upper-outer quadrant of unspecified female breast: Secondary | ICD-10-CM

## 2013-05-20 DIAGNOSIS — D0510 Intraductal carcinoma in situ of unspecified breast: Secondary | ICD-10-CM

## 2013-05-20 DIAGNOSIS — Z5112 Encounter for antineoplastic immunotherapy: Secondary | ICD-10-CM

## 2013-05-20 DIAGNOSIS — D709 Neutropenia, unspecified: Secondary | ICD-10-CM

## 2013-05-20 DIAGNOSIS — Z170 Estrogen receptor positive status [ER+]: Secondary | ICD-10-CM

## 2013-05-20 DIAGNOSIS — E860 Dehydration: Secondary | ICD-10-CM

## 2013-05-20 LAB — CBC WITH DIFFERENTIAL/PLATELET
BASO%: 0.4 % (ref 0.0–2.0)
HCT: 35.2 % (ref 34.8–46.6)
LYMPH%: 35.1 % (ref 14.0–49.7)
MCHC: 32.4 g/dL (ref 31.5–36.0)
MCV: 90 fL (ref 79.5–101.0)
MONO#: 1.8 10*3/uL — ABNORMAL HIGH (ref 0.1–0.9)
MONO%: 20.5 % — ABNORMAL HIGH (ref 0.0–14.0)
NEUT%: 42.9 % (ref 38.4–76.8)
Platelets: 167 10*3/uL (ref 145–400)
RBC: 3.91 10*6/uL (ref 3.70–5.45)
WBC: 8.6 10*3/uL (ref 3.9–10.3)
nRBC: 0 % (ref 0–0)

## 2013-05-20 LAB — COMPREHENSIVE METABOLIC PANEL (CC13)
BUN: 15.2 mg/dL (ref 7.0–26.0)
CO2: 28 mEq/L (ref 22–29)
Calcium: 8.7 mg/dL (ref 8.4–10.4)
Chloride: 103 mEq/L (ref 98–107)
Creatinine: 0.6 mg/dL (ref 0.6–1.1)
Total Bilirubin: 0.37 mg/dL (ref 0.20–1.20)

## 2013-05-20 MED ORDER — SODIUM CHLORIDE 0.9 % IV SOLN
Freq: Once | INTRAVENOUS | Status: AC
  Administered 2013-05-20: 09:00:00 via INTRAVENOUS

## 2013-05-20 MED ORDER — SODIUM CHLORIDE 0.9 % IJ SOLN
10.0000 mL | INTRAMUSCULAR | Status: DC | PRN
  Administered 2013-05-20: 10 mL
  Filled 2013-05-20: qty 10

## 2013-05-20 MED ORDER — ACETAMINOPHEN 325 MG PO TABS
650.0000 mg | ORAL_TABLET | Freq: Once | ORAL | Status: AC
  Administered 2013-05-20: 650 mg via ORAL

## 2013-05-20 MED ORDER — HEPARIN SOD (PORK) LOCK FLUSH 100 UNIT/ML IV SOLN
500.0000 [IU] | Freq: Once | INTRAVENOUS | Status: AC | PRN
  Administered 2013-05-20: 500 [IU]
  Filled 2013-05-20: qty 5

## 2013-05-20 MED ORDER — TRASTUZUMAB CHEMO INJECTION 440 MG
2.0000 mg/kg | Freq: Once | INTRAVENOUS | Status: AC
  Administered 2013-05-20: 147 mg via INTRAVENOUS
  Filled 2013-05-20: qty 7

## 2013-05-20 NOTE — Progress Notes (Signed)
[facility]  [phone]    OFFICE PROGRESS NOTE  CC  [name], MD [address] [place] [address] Dr. Emelia Loron Dr. Chipper Herb Dr. Starleen Arms Dr. Dorothy Puffer  DIAGNOSIS: 57 year old female who presented on 12/24/2012 which new diagnosis of breast cancer  PRIOR THERAPY:  #1 patient has multiple medical problems but recently she had a mammogram performed that showed a 5.4 cm area of calcifications. MRI of the breasts showed the area extended to about 7.2 cm. Needle core biopsy performed showed a high-grade DCIS that was ER positive PR positive.  #2 she was seen by Dr. Emelia Loron regarding surgical options. Patient wanted to have bilateral mastectomies. She went on to have this performed on 12/31/2012. The final pathology did reveal a 2.3 cm invasive ductal carcinoma in the left breast. The tumor was ER +100% PR +86% HER-2/neu was amplified with a ratio of 6.81. Ki-67 was elevated at 46%. She has had immediate reconstruction.  #3 patient is seen back in medical oncology for discussion of adjuvant treatment. Since patient is HER-2/neu positive recommendation is her 2-based therapy consisting of Taxotere carboplatinum and Herceptin. The Taxotere or and carboplatinum will be given every 3 weeks for 6 cycles. She will receive Neulasta on day 2. Herceptin will be given weekly with duration of chemotherapy and then every 3 weeks to finish out a one-year of treatment. Patient also understands that she will be placed on antiestrogen therapy with an aromatase inhibitor or tamoxifen.  TCH was started on 02/04/13.  CURRENT THERAPY:  weekly Herceptin  INTERVAL HISTORY: Patient returns in followup today prior to her weekly herceptin.  She continues to see Dr. Kelly Splinter and had her drain removed last week.  There is no further drainage from the breast.  The wound is healed.  She denies fevers, chills, and had mild nausea that was relieved with her  anti-emetics, constipation, she does have mild diarrhea.  She has mild numbness in her toes.  She is taking vitamin b complex.  Otherwise a 10 point ROS is neg.    MEDICAL HISTORY: Past Medical History  Diagnosis Date  . IBS (irritable bowel syndrome)   . ADD (attention deficit disorder)   . Lyme disease   . Contact lens/glasses fitting     wears contacts or glasses  . Breast cancer 12/04/12    left, ER/PR +  . Allergy   . Asthma   . Thyroid disease   . Hypothyroidism     ALLERGIES:  is allergic to betadine; dilaudid; iodine; penicillins; shellfish-derived products; sulfamethoxazole w-trimethoprim; and cleocin.  MEDICATIONS:  Current Outpatient Prescriptions  Medication Sig Dispense Refill  . albuterol (PROVENTIL HFA;VENTOLIN HFA) 108 (90 BASE) MCG/ACT inhaler Inhale 2 puffs into the lungs every 6 (six) hours as needed for shortness of breath.       . B Complex-C (B-COMPLEX WITH VITAMIN C) tablet Take 1 tablet by mouth daily.      . bacitracin 500 UNIT/GM ointment Apply 1 application topically 2 (two) times daily. Applied to wound on breast.      . dexamethasone (DECADRON) 4 MG tablet TAKE TWO TABLETS [8MG ] BY MOUTH TWICE DAILY THE DAY BEFORE TAXOTERE.THEN TAKE TWO TABLETS [8MG ] TWICE DAILY STARTING THE DAY AFTER CHEMO FOR THREE DAYS.  30 tablet  0  . dextroamphetamine (DEXTROSTAT) 5 MG tablet Take 2.5 mg by mouth 3 (three) times daily.       [name] EPINEPHrine (EPIPEN) 0.3 mg/0.3 mL DEVI Inject 0.3 mg  into the muscle as needed (for allergic reaction).       . eszopiclone (LUNESTA) 2 MG TABS Take 2 mg by mouth at bedtime as needed (for sleep).       . fluconazole (DIFLUCAN) 200 MG tablet Take 1 tablet (200 mg total) by mouth daily.  5 tablet  3  . levothyroxine (SYNTHROID, LEVOTHROID) 100 MCG tablet Take 100 mcg by mouth daily.      [name] lidocaine-prilocaine (EMLA) cream Apply 1 application topically as needed (for port-a-cath access).      [name] loratadine (CLARITIN) 10 MG tablet Take 10 mg by  mouth daily as needed (for 5 days after Neulasta injection).      . LORazepam (ATIVAN) 1 MG tablet Take 1 tablet (1 mg total) by mouth every 8 (eight) hours as needed for anxiety.  30 tablet  0  . Multiple Vitamin (MULTIVITAMIN WITH MINERALS) TABS Take 1 tablet by mouth daily.      . ondansetron (ZOFRAN ODT) 8 MG disintegrating tablet Take 1 tablet (8 mg total) by mouth every 8 (eight) hours as needed for nausea.  20 tablet  5  . oxyCODONE (OXY IR/ROXICODONE) 5 MG immediate release tablet Take 1 tablet (5 mg total) by mouth every 4 (four) hours as needed.  30 tablet  0  . PRESCRIPTION MEDICATION Inject into the vein as directed. Herceptin infusion weekly on Thursdays; Taxotere and Paraplatin q3weeks with Neulasta support.      . prochlorperazine (COMPAZINE) 10 MG tablet Take 1 tablet (10 mg total) by mouth every 6 (six) hours as needed (Nausea or vomiting).  30 tablet  1  . prochlorperazine (COMPAZINE) 25 MG suppository Place 1 suppository (25 mg total) rectally every 12 (twelve) hours as needed for nausea.  12 suppository  0  . promethazine (PHENERGAN) 25 MG tablet Take 1/2 tablet to 1 tablet 12.5mg -25mg  every 6 hours as needed for nausea.  30 tablet  1  . sennosides-docusate sodium (SENOKOT-S) 8.6-50 MG tablet Take 1 tablet by mouth daily as needed for constipation.       . vitamin C (ASCORBIC ACID) 500 MG tablet Take 500 mg by mouth daily.       No current facility-administered medications for this visit.    SURGICAL HISTORY:  Past Surgical History  Procedure Laterality Date  . Brain surgery      removal benign frontal lobe cyst in 1987  . Foot surgery  2013    bilateral  . Gynecologic cryosurgery      cervical cancer, 1998 last remained disease free with recent normal pap  . Diagnostic laparoscopy  2004    lysis adh-uterine ablation  . Breast surgery      bilateral mastopexy  . Breast surgery      removal benign left breast lesion  . Abdominal hysterectomy  2005    TAH/BSO-lysis  adh, enometriosis  . Face lift    . Axillary sentinel node biopsy  12/31/2012    Procedure: AXILLARY SENTINEL NODE BIOPSY;  Surgeon: Emelia Loron, MD;  Location: [facility];  Service: General;  Laterality: Bilateral;  bilateral sentinel node  . Total mastectomy  12/31/2012    Procedure: TOTAL MASTECTOMY;  Surgeon: Emelia Loron, MD;  Location: [facility];  Service: General;  Laterality: Bilateral;  bilateral total mastectomies  . Breast reconstruction with placement of tissue expander and flex hd (acellular hydrated dermis)  12/31/2012    Procedure: BREAST RECONSTRUCTION WITH PLACEMENT OF TISSUE EXPANDER AND FLEX HD (ACELLULAR  HYDRATED DERMIS);  Surgeon: Wayland Denis, DO;  Location: [facility];  Service: Plastics;  Laterality: Bilateral;  bilateral immediate breast reconstruction with expanders and flex hd   . Portacath placement  01/27/2013    Procedure: INSERTION PORT-A-CATH;  Surgeon: Emelia Loron, MD;  Location: [facility];  Service: General;  Laterality: Right;  . Incision and drainage of wound Left 04/28/2013    Procedure: IRRIGATION AND DEBRIDEMENT LEFT BREAST WOUND, POSSIBLE CLOSURE OF WOUND WITH DRAIN PLACEMENT;  Surgeon: Wayland Denis, DO;  Location: [facility];  Service: Plastics;  Laterality: Left;    REVIEW OF SYSTEMS:  General: fatigue (+), night sweats (-), fever (+), pain (-) Lymph: palpable nodes (-) HEENT: vision changes (-), mucositis (-), gum bleeding (-), epistaxis (-) Cardiovascular: chest pain (-), palpitations (-) Pulmonary: shortness of breath (-), dyspnea on exertion (-), cough (-), hemoptysis (-) GI:  Early satiety (-), melena (-), dysphagia (-), nausea/vomiting (-), diarrhea (-) GU: dysuria (-), hematuria (-), incontinence (-) Musculoskeletal: joint swelling (-), joint pain (-), back pain (-) Neuro: weakness (-), numbness (-), headache (-), confusion (-) Skin: Rash (-), lesions (-),  dryness (-) Psych: depression (-), suicidal/homicidal ideation (-), feeling of hopelessness (-)  PHYSICAL EXAMINATION:  Blood pressure 105/66, pulse 77, temperature 98.6 F (37 C), temperature source Oral, resp. rate 20, height 5\' 5"  (1.651 m), weight 153 lb (69.4 kg). Body mass index is 25.46 kg/(m^2).   ECOG PERFORMANCE STATUS: 1 - Symptomatic but completely ambulatory  General: Patient is a well appearing female in no acute distress HEENT: PERRLA, sclerae anicteric no conjunctival pallor, MMM Neck: supple, no palpable adenopathy Lungs: clear to auscultation bilaterally, no wheezes, rhonchi, or rales. Right port negative for discharge, tenderness or erythema. Cardiovascular: regular rate rhythm, S1, S2, no murmurs, rubs or gallops Abdomen: Soft, non-tender, non-distended, normoactive bowel sounds, no HSM Extremities: warm and well perfused, no clubbing, cyanosis, or edema Skin: No rashes or lesions Neuro: Non-focal Breasts: She status post bilateral mastectomies with expander is in place. The cosmetic result is symmetrical.  Both axillae are benign. Her left breast is healing well, it is very mildly erythematous with no drainage and a 0.4cm scab on the lower incision.  No tenderness or warmth.    LABORATORY DATA: Lab Results  Component Value Date   WBC 8.6 05/20/2013   HGB 11.4* 05/20/2013   HCT 35.2 05/20/2013   MCV 90.0 05/20/2013   PLT 167 05/20/2013      Chemistry      Component Value Date/Time   NA 140 05/13/2013 0813   NA 137 04/14/2013 0430   K 3.8 05/13/2013 0813   K 3.9 04/14/2013 0430   CL 105 05/13/2013 0813   CL 103 04/14/2013 0430   CO2 26 05/13/2013 0813   CO2 27 04/14/2013 0430   BUN 24.3 05/13/2013 0813   BUN 13 04/14/2013 0430   CREATININE 0.6 05/13/2013 0813   CREATININE 0.55 04/14/2013 0430      Component Value Date/Time   CALCIUM 8.9 05/13/2013 0813   CALCIUM 8.2* 04/14/2013 0430   ALKPHOS 42 05/13/2013 0813   ALKPHOS 50 04/13/2013 2030   AST 18 05/13/2013 0813    AST 26 04/13/2013 2030   ALT 22 05/13/2013 0813   ALT 36* 04/13/2013 2030   BILITOT 0.32 05/13/2013 0813   BILITOT 0.5 04/13/2013 2030     12/31/2012 ADDITIONAL INFORMATION: 1. CHROMOGENIC IN-SITU HYBRIDIZATION Interpretation: HER2/NEU BY CISH - SHOWS AMPLIFICATION BY CISH ANALYSIS. THE RATIO OF  HER2: CEP 17 SIGNALS WAS 6.81 Reference range: Ratio: HER2:CEP17 < 1.8 gene amplification not observed Ratio: HER2:CEP 17 1.8-2.2 - equivocal result Ratio: HER2:CEP17 > 2.2 - gene amplification observed Pecola Leisure MD Pathologist, Electronic Signature ( Signed 01/11/2013) 1. PROGNOSTIC INDICATORS - ACIS Results IMMUNOHISTOCHEMICAL AND MORPHOMETRIC ANALYSIS BY THE AUTOMATED CELLULAR IMAGING SYSTEM (ACIS) Estrogen Receptor (Negative, <1%): 99%, STRONG STAINING INTENSITY Progesterone Receptor (Negative, <1%): 10%, STRONG STAINING INTENSITY Proliferation Marker Ki67 by M IB-1 (Low<20%): 46% All controls stained appropriately Pecola Leisure MD Pathologist, Electronic Signature ( Signed 01/06/2013) 1 of 4 FINAL for HULL, Staphany [phone]) FINAL DIAGNOSIS Diagnosis 1. Breast, simple mastectomy, Left - INVASIVE DUCTAL CARCINOMA, GRADE II (2.3 CM), SEE COMMENT. - LYMPHOVASCULAR INVASION IDENTIFIED - INVASIVE TUMOR IS 3 CM FROM THE NEAREST MARGIN (DEEP). - HIGH GRADE DUCTAL CARCINOMA IN SITU WITH COMEDO NECROSIS AND CALCIFICATIONS. - SEE TUMOR SYNOPTIC TEMPLATE BELOW. 2. Lymph node, sentinel, biopsy, Left axillary - ONE LYMPH NODE, NEGATIVE FOR TUMOR (0/1). 3. Breast, simple mastectomy, Right - BENIGN BREAST TISSUE, SEE COMMENT. - NEGATIVE FOR ATYPIA OR MALIGNANCY. 4. Lymph node, sentinel, biopsy, Right axillary - ONE LYMPH NODE, NEGATIVE FOR TUMOR (0/1). Microscopic Comment 1. BREAST, INVASIVE TUMOR, WITH LYMPH NODE SAMPLING Specimen, including laterality: Left breast Procedure: Simple mastectomy Grade: II of III Tubule formation: 3 Nuclear pleomorphism: 2 Mitotic: 1 Tumor size (gross  measurement) 2.3 cm Margins: Invasive, distance to closest margin: 3.0 cm In-situ, distance to closest margin: 3.0 cm (deep) If margin positive, focally or broadly: N/A Lymphovascular invasion: Present, extensive Ductal carcinoma in situ: Present Grade: III of III Extensive intraductal component: Absent Lobular neoplasia: Absent Tumor focality: Unifocal Treatment effect: None If present, treatment effect in breast tissue, lymph nodes or both: N/A Extent of tumor: Skin: Grossly negative for tumor Nipple: Grossly negative for tumor Skeletal muscle: Microscopically negative for tumor Lymph nodes: # examined: 1 Lymph nodes with metastasis: 0 Breast prognostic profile: Estrogen receptor: Repeated, previous study shows 100% positivity. [phone]) Progesterone receptor: Repeated, previous study demonstrated 86% positivity ([id number]) Her 2 neu: Pending and will be reported in an addendum. Ki-67: Pending and will be reported in an addendum. Non-neoplastic breast: No significant findings. TNM: pT2, pN0, pMX Comments: None. (CR:kh 01-04-13) 3. The entire 2.0 cm previous biopsy hematoma is submitted for histopathologic review. On review, there is 2 of 4 FINAL for HULL, Lititia [id number] [phone]) Microscopic Comment(continued) no in situ or invasive carcinoma identified in any of the slide sections examined. Additional non-neoplastic findings include benign fibrocystic change, pseudoangiomatous stromal hyperplasia, fibroadenomatoid nodules, periductal chronic inflammation with associated stromal fibrosis and microcalcifications in benign ducts and lobules. Pathologic Stage is pTX pN0, PMX. The surgical resection margin(s) of the specimen were inked and microscopically evaluated. [place] [facility] RADIOGRAPHIC STUDIES:  Mr Biopsy/wire Localization  12/25/2012  *RADIOLOGY REPORT*  Clinical Data:  6 mm enhancing mass in the upper right breast.  The patient has recent diagnosis of left breast DCIS.  MRI  GUIDED VACUUM ASSISTED BIOPSY OF THE RIGHT BREAST WITHOUT AND WITH CONTRAST  Comparison: Previous exams.  Technique: Multiplanar, multisequence MR images of the right breast were obtained prior to and following the intravenous administration of 15 ml of Mulithance.  I met with the patient, and we discussed the procedure of MRI guided biopsy, including risks, benefits, and alternatives. Specifically, we discussed the risks of infection, bleeding, tissue injury, clip migration, and inadequate sampling.  Informed, written consent was given.  Using sterile technique, 2% Lidocaine, MRI guidance, and a 9 gauge  vacuum assisted device, biopsy was performed of a 6 mm enhancing mass using a lateral to medial approach.  At the conclusion of the procedure, a tissue marker clip was deployed into the biopsy cavity.  IMPRESSION: MRI guided biopsy of the right breast. No apparent complications.  THREE-DIMENSIONAL MR IMAGE RENDERING ON INDEPENDENT WORKSTATION:  Three-dimensional MR images were rendered by post-processing of the original MR data on an independent workstation.  The three- dimensional MR images were interpreted, and findings were reported in the accompanying complete MRI report for this study.   Original Report Authenticated By: Britta Mccreedy, M.D.    Nm Sentinel Node Inj-no Rpt (breast)  12/31/2012  CLINICAL DATA: right axillary sentinel node biopsy   Sulfur colloid was injected intradermally by the nuclear medicine  technologist for breast cancer sentinel node localization.     Nm Sentinel Node Inj-no Rpt (breast)  12/31/2012  CLINICAL DATA: left axillary sentinel node biopsy   Sulfur colloid was injected intradermally by the nuclear medicine  technologist for breast cancer sentinel node localization.     Mm Digital Diagnostic Unilat R  12/25/2012  *RADIOLOGY REPORT*  Clinical Data:  MRI guided core needle biopsy of a 6 mm nodule of enhancement in the upper right breast was performed.  DIGITAL DIAGNOSTIC RIGHT  MAMMOGRAM  Comparison:  Previous exams.  Findings:  Films are performed following MRI guided biopsy of 6 mm enhancing nodule in the upper central right breast.  A dumbbell shaped biopsy clip is present in the upper right breast just medial to the level of the nipple.  The clip is within a biopsy cavity that contains air and a small air-fluid level.  IMPRESSION: Satisfactory position of biopsy clip in the upper right breast.   Original Report Authenticated By: Britta Mccreedy, M.D.     ASSESSMENT: 57 year old [place] woman  (1) status post bilateral mastectomies in 12/31/2012 showing  (a) on the right, no evidence of malignancy  (b) on the left, a pT2 pN0, stage IIA invasive ductal carcinoma, grade 2, estrogen receptor 100% positive, progesterone receptor 86% positive, with an MIB-1 of 46%, and HER-2 amplification with a CISH ratio of 6.81  (2) starting adjuvant chemotherapy with carboplatin, docetaxel and trastuzumab February13 2014, to consist of 6 cycles.  #3 patient will proceed with Herceptin today.   #4 verall she is tolerating it well. She does develop neutropenia with dehydration and fatigue. We are supporting her through this.Her counts today are adequate.    PLAN:   #1 Proceed with Herceptin.     #2 She will return in one week's time Herceptin.  #3 Since has completed antibiotics with Cipro.      All questions were answered. The patient knows to call the clinic with any problems, questions or concerns. We can certainly see the patient much sooner if necessary  I spent 25 minutes counseling the patient face to face.  The total time spent in the appointment was 30 minutes.  Cherie Ouch Lyn Hollingshead, NP Medical Oncology [facility] Phone: [phone] 05/20/2013, 8:44 AM

## 2013-05-20 NOTE — Patient Instructions (Signed)
[facility] Discharge Instructions for Patients Receiving Chemotherapy  Today you received the following chemotherapy agents Herceptin.  To help prevent nausea and vomiting after your treatment, we encourage you to take your nausea medication as prescribed.   If you develop nausea and vomiting that is not controlled by your nausea medication, call the clinic. If it is after clinic hours your family physician or the after hours number for the clinic or go to the Emergency Department.   BELOW ARE SYMPTOMS THAT SHOULD BE REPORTED IMMEDIATELY:  *FEVER GREATER THAN 100.5 F  *CHILLS WITH OR WITHOUT FEVER  NAUSEA AND VOMITING THAT IS NOT CONTROLLED WITH YOUR NAUSEA MEDICATION  *UNUSUAL SHORTNESS OF BREATH  *UNUSUAL BRUISING OR BLEEDING  TENDERNESS IN MOUTH AND THROAT WITH OR WITHOUT PRESENCE OF ULCERS  *URINARY PROBLEMS  *BOWEL PROBLEMS  UNUSUAL RASH Items with * indicate a potential emergency and should be followed up as soon as possible.  Feel free to call the clinic you have any questions or concerns. The clinic phone number is [phone].   I have been informed and understand all the instructions given to me. I know to contact the clinic, my physician, or go to the Emergency Department if any problems should occur. I do not have any questions at this time, but understand that I may call the clinic during office hours   should I have any questions or need assistance in obtaining follow up care.    __________________________________________  _____________  __________ Signature of Patient or Authorized Representative            Date                   Time    __________________________________________ Nurse's Signature    

## 2013-05-20 NOTE — Patient Instructions (Addendum)
Doing well, proceed with Herceptin.  Please call us if you have any questions or concerns.    

## 2013-05-21 ENCOUNTER — Ambulatory Visit: Payer: 59

## 2013-05-26 ENCOUNTER — Encounter: Payer: Self-pay | Admitting: Adult Health

## 2013-05-26 ENCOUNTER — Other Ambulatory Visit: Payer: Self-pay | Admitting: Medical Oncology

## 2013-05-26 ENCOUNTER — Telehealth: Payer: Self-pay | Admitting: Oncology

## 2013-05-26 ENCOUNTER — Other Ambulatory Visit (HOSPITAL_BASED_OUTPATIENT_CLINIC_OR_DEPARTMENT_OTHER): Payer: 59 | Admitting: Lab

## 2013-05-26 ENCOUNTER — Ambulatory Visit (HOSPITAL_BASED_OUTPATIENT_CLINIC_OR_DEPARTMENT_OTHER): Payer: 59 | Admitting: Adult Health

## 2013-05-26 ENCOUNTER — Ambulatory Visit (HOSPITAL_BASED_OUTPATIENT_CLINIC_OR_DEPARTMENT_OTHER): Payer: 59

## 2013-05-26 VITALS — BP 111/72 | HR 90 | Temp 97.9°F | Resp 20 | Ht 65.0 in | Wt 155.5 lb

## 2013-05-26 DIAGNOSIS — C50419 Malignant neoplasm of upper-outer quadrant of unspecified female breast: Secondary | ICD-10-CM

## 2013-05-26 DIAGNOSIS — D0512 Intraductal carcinoma in situ of left breast: Secondary | ICD-10-CM

## 2013-05-26 DIAGNOSIS — D72829 Elevated white blood cell count, unspecified: Secondary | ICD-10-CM

## 2013-05-26 DIAGNOSIS — C50912 Malignant neoplasm of unspecified site of left female breast: Secondary | ICD-10-CM

## 2013-05-26 DIAGNOSIS — Z170 Estrogen receptor positive status [ER+]: Secondary | ICD-10-CM

## 2013-05-26 DIAGNOSIS — C50412 Malignant neoplasm of upper-outer quadrant of left female breast: Secondary | ICD-10-CM

## 2013-05-26 DIAGNOSIS — D709 Neutropenia, unspecified: Secondary | ICD-10-CM

## 2013-05-26 DIAGNOSIS — N611 Abscess of the breast and nipple: Secondary | ICD-10-CM

## 2013-05-26 LAB — CBC WITH DIFFERENTIAL/PLATELET
Basophils Absolute: 0.1 10*3/uL (ref 0.0–0.1)
Eosinophils Absolute: 0 10*3/uL (ref 0.0–0.5)
HCT: 31.8 % — ABNORMAL LOW (ref 34.8–46.6)
HGB: 10.8 g/dL — ABNORMAL LOW (ref 11.6–15.9)
LYMPH%: 12 % — ABNORMAL LOW (ref 14.0–49.7)
MONO#: 1 10*3/uL — ABNORMAL HIGH (ref 0.1–0.9)
NEUT%: 82.8 % — ABNORMAL HIGH (ref 38.4–76.8)
Platelets: 208 10*3/uL (ref 145–400)
WBC: 21 10*3/uL — ABNORMAL HIGH (ref 3.9–10.3)
lymph#: 2.5 10*3/uL (ref 0.9–3.3)

## 2013-05-26 LAB — COMPREHENSIVE METABOLIC PANEL (CC13)
ALT: 21 U/L (ref 0–55)
Albumin: 2.7 g/dL — ABNORMAL LOW (ref 3.5–5.0)
CO2: 24 mEq/L (ref 22–29)
Chloride: 105 mEq/L (ref 98–107)
Glucose: 111 mg/dl — ABNORMAL HIGH (ref 70–99)
Potassium: 3.9 mEq/L (ref 3.5–5.1)
Sodium: 138 mEq/L (ref 136–145)
Total Bilirubin: 0.24 mg/dL (ref 0.20–1.20)
Total Protein: 6.4 g/dL (ref 6.4–8.3)

## 2013-05-26 MED ORDER — VANCOMYCIN HCL 10 G IV SOLR
2000.0000 mg | Freq: Once | INTRAVENOUS | Status: AC
  Administered 2013-05-26: 2000 mg via INTRAVENOUS
  Filled 2013-05-26: qty 2000

## 2013-05-26 MED ORDER — LORAZEPAM 1 MG PO TABS: 1.0000 mg | ORAL_TABLET | Freq: Three times a day (TID) | ORAL | Status: DC | PRN

## 2013-05-26 NOTE — Telephone Encounter (Signed)
, °

## 2013-05-26 NOTE — Progress Notes (Signed)
[facility]  [phone]) [phone]    OFFICE PROGRESS NOTE  CC  [name], MD [address] [place] [address] Dr. Emelia Loron Dr. Chipper Herb Dr. Starleen Arms Dr. Dorothy Puffer  DIAGNOSIS: 57 year old female who presented on 12/24/2012 which new diagnosis of invasive ductal carcinoma, ER positive, PR positive, HER-2/neu positive in the left breast.    PRIOR THERAPY:  #1 patient has multiple medical problems but recently she had a mammogram performed that showed a 5.4 cm area of calcifications. MRI of the breasts showed the area extended to about 7.2 cm. Needle core biopsy performed showed a high-grade DCIS that was ER positive PR positive.  #2 she was seen by Dr. Emelia Loron regarding surgical options. Patient wanted to have bilateral mastectomies. She went on to have this performed on 12/31/2012. The final pathology did reveal a 2.3 cm invasive ductal carcinoma in the left breast. The tumor was ER +100% PR +86% HER-2/neu was amplified with a ratio of 6.81. Ki-67 was elevated at 46%. She has had immediate reconstruction.  #3 patient is seen back in medical oncology for discussion of adjuvant treatment. Since patient is HER-2/neu positive recommendation is her 2-based therapy consisting of Taxotere carboplatinum and Herceptin. The Taxotere or and carboplatinum will be given every 3 weeks for 6 cycles. She will receive Neulasta on day 2. Herceptin will be given weekly with duration of chemotherapy and then every 3 weeks to finish out a one-year of treatment. Patient also understands that she will be placed on antiestrogen therapy with an aromatase inhibitor or tamoxifen.  TCH was started on 02/04/13.  CURRENT THERAPY:  weekly Herceptin  INTERVAL HISTORY: Patient returns in followup today prior to her weekly herceptin.   She is doing well today.  Her left breast from the recent infection continues to do well.  Her WBC is mildly elevated.  She has no  drainage from the breast, or tenderness.  She does have a mild amount of redness that has been there since surgery and drain removal.  She also had two episodes of intense diarrhea last week that have since resolved, however unusual for her.  Otherwise she denies fevers, chills, nausea, vomiting, numbness, or any other concerns.    MEDICAL HISTORY: Past Medical History  Diagnosis Date  . IBS (irritable bowel syndrome)   . ADD (attention deficit disorder)   . Lyme disease   . Contact lens/glasses fitting     wears contacts or glasses  . Breast cancer 12/04/12    left, ER/PR +  . Allergy   . Asthma   . Thyroid disease   . Hypothyroidism     ALLERGIES:  is allergic to betadine; dilaudid; iodine; penicillins; shellfish-derived products; sulfamethoxazole w-trimethoprim; and cleocin.  MEDICATIONS:  Current Outpatient Prescriptions  Medication Sig Dispense Refill  . albuterol (PROVENTIL HFA;VENTOLIN HFA) 108 (90 BASE) MCG/ACT inhaler Inhale 2 puffs into the lungs every 6 (six) hours as needed for shortness of breath.       . B Complex-C (B-COMPLEX WITH VITAMIN C) tablet Take 1 tablet by mouth daily.      . bacitracin 500 UNIT/GM ointment Apply 1 application topically 2 (two) times daily. Applied to wound on breast.      . dexamethasone (DECADRON) 4 MG tablet TAKE TWO TABLETS [8MG ] BY MOUTH TWICE DAILY THE DAY BEFORE TAXOTERE.THEN TAKE TWO TABLETS [8MG ] TWICE DAILY STARTING THE DAY AFTER CHEMO FOR THREE DAYS.  30 tablet  0  . dextroamphetamine (DEXTROSTAT) 5 MG  tablet Take 2.5 mg by mouth 3 (three) times daily.       [name] EPINEPHrine (EPIPEN) 0.3 mg/0.3 mL DEVI Inject 0.3 mg into the muscle as needed (for allergic reaction).       . eszopiclone (LUNESTA) 2 MG TABS Take 2 mg by mouth at bedtime as needed (for sleep).       . fluconazole (DIFLUCAN) 200 MG tablet Take 1 tablet (200 mg total) by mouth daily.  5 tablet  3  . levothyroxine (SYNTHROID, LEVOTHROID) 100 MCG tablet Take 100 mcg by mouth  daily.      [name] lidocaine-prilocaine (EMLA) cream Apply 1 application topically as needed (for port-a-cath access).      [name] loratadine (CLARITIN) 10 MG tablet Take 10 mg by mouth daily as needed (for 5 days after Neulasta injection).      . LORazepam (ATIVAN) 1 MG tablet Take 1 tablet (1 mg total) by mouth every 8 (eight) hours as needed for anxiety.  30 tablet  0  . Multiple Vitamin (MULTIVITAMIN WITH MINERALS) TABS Take 1 tablet by mouth daily.      . ondansetron (ZOFRAN ODT) 8 MG disintegrating tablet Take 1 tablet (8 mg total) by mouth every 8 (eight) hours as needed for nausea.  20 tablet  5  . oxyCODONE (OXY IR/ROXICODONE) 5 MG immediate release tablet Take 1 tablet (5 mg total) by mouth every 4 (four) hours as needed.  30 tablet  0  . PRESCRIPTION MEDICATION Inject into the vein as directed. Herceptin infusion weekly on Thursdays; Taxotere and Paraplatin q3weeks with Neulasta support.      . prochlorperazine (COMPAZINE) 10 MG tablet Take 1 tablet (10 mg total) by mouth every 6 (six) hours as needed (Nausea or vomiting).  30 tablet  1  . prochlorperazine (COMPAZINE) 25 MG suppository Place 1 suppository (25 mg total) rectally every 12 (twelve) hours as needed for nausea.  12 suppository  0  . promethazine (PHENERGAN) 25 MG tablet Take 1/2 tablet to 1 tablet 12.5mg -25mg  every 6 hours as needed for nausea.  30 tablet  1  . sennosides-docusate sodium (SENOKOT-S) 8.6-50 MG tablet Take 1 tablet by mouth daily as needed for constipation.       . vitamin C (ASCORBIC ACID) 500 MG tablet Take 500 mg by mouth daily.       No current facility-administered medications for this visit.    SURGICAL HISTORY:  Past Surgical History  Procedure Laterality Date  . Brain surgery      removal benign frontal lobe cyst in 1987  . Foot surgery  2013    bilateral  . Gynecologic cryosurgery      cervical cancer, 1998 last remained disease free with recent normal pap  . Diagnostic laparoscopy  2004    lysis  adh-uterine ablation  . Breast surgery      bilateral mastopexy  . Breast surgery      removal benign left breast lesion  . Abdominal hysterectomy  2005    TAH/BSO-lysis adh, enometriosis  . Face lift    . Axillary sentinel node biopsy  12/31/2012    Procedure: AXILLARY SENTINEL NODE BIOPSY;  Surgeon: Emelia Loron, MD;  Location: [facility];  Service: General;  Laterality: Bilateral;  bilateral sentinel node  . Total mastectomy  12/31/2012    Procedure: TOTAL MASTECTOMY;  Surgeon: Emelia Loron, MD;  Location: [facility];  Service: General;  Laterality: Bilateral;  bilateral total mastectomies  . Breast reconstruction with placement  of tissue expander and flex hd (acellular hydrated dermis)  12/31/2012    Procedure: BREAST RECONSTRUCTION WITH PLACEMENT OF TISSUE EXPANDER AND FLEX HD (ACELLULAR HYDRATED DERMIS);  Surgeon: Wayland Denis, DO;  Location: [facility];  Service: Plastics;  Laterality: Bilateral;  bilateral immediate breast reconstruction with expanders and flex hd   . Portacath placement  01/27/2013    Procedure: INSERTION PORT-A-CATH;  Surgeon: Emelia Loron, MD;  Location: [facility];  Service: General;  Laterality: Right;  . Incision and drainage of wound Left 04/28/2013    Procedure: IRRIGATION AND DEBRIDEMENT LEFT BREAST WOUND, POSSIBLE CLOSURE OF WOUND WITH DRAIN PLACEMENT;  Surgeon: Wayland Denis, DO;  Location:  SURGERY CENTER;  Service: Plastics;  Laterality: Left;    REVIEW OF SYSTEMS:  General: fatigue (+), night sweats (-), fever (+), pain (-) Lymph: palpable nodes (-) HEENT: vision changes (-), mucositis (-), gum bleeding (-), epistaxis (-) Cardiovascular: chest pain (-), palpitations (-) Pulmonary: shortness of breath (-), dyspnea on exertion (-), cough (-), hemoptysis (-) GI:  Early satiety (-), melena (-), dysphagia (-), nausea/vomiting (-), diarrhea (-) GU: dysuria (-), hematuria (-),  incontinence (-) Musculoskeletal: joint swelling (-), joint pain (-), back pain (-) Neuro: weakness (-), numbness (-), headache (-), confusion (-) Skin: Rash (-), lesions (-), dryness (-) Psych: depression (-), suicidal/homicidal ideation (-), feeling of hopelessness (-)  PHYSICAL EXAMINATION:  Blood pressure 111/72, pulse 90, temperature 97.9 F (36.6 C), temperature source Oral, resp. rate 20, height 5\' 5"  (1.651 m), weight 155 lb 8 oz (70.534 kg). Body mass index is 25.88 kg/(m^2). General: Patient is a well appearing female in no acute distress HEENT: PERRLA, sclerae anicteric no conjunctival pallor, MMM Neck: supple, no palpable adenopathy Lungs: clear to auscultation bilaterally, no wheezes, rhonchi, or rales. Right port negative for discharge, tenderness or erythema. Cardiovascular: regular rate rhythm, S1, S2, no murmurs, rubs or gallops Abdomen: Soft, non-tender, non-distended, normoactive bowel sounds, no HSM Extremities: warm and well perfused, no clubbing, cyanosis, or edema Skin: No rashes or lesions Neuro: Non-focal Breasts: She status post bilateral mastectomies with expander is in place. The cosmetic result is symmetrical.  Both axillae are benign. Her left breast is healing well, it is erythematous on the left lower outer quadrant and possibly fluctuant.  Slightly warm, and non-tender.   ECOG PERFORMANCE STATUS: 1 - Symptomatic but completely ambulatory  LABORATORY DATA: Lab Results  Component Value Date   WBC 21.0* 05/26/2013   HGB 10.8* 05/26/2013   HCT 31.8* 05/26/2013   MCV 87.1 05/26/2013   PLT 208 05/26/2013      Chemistry      Component Value Date/Time   NA 137 05/20/2013 0814   NA 137 04/14/2013 0430   K 4.0 05/20/2013 0814   K 3.9 04/14/2013 0430   CL 103 05/20/2013 0814   CL 103 04/14/2013 0430   CO2 28 05/20/2013 0814   CO2 27 04/14/2013 0430   BUN 15.2 05/20/2013 0814   BUN 13 04/14/2013 0430   CREATININE 0.6 05/20/2013 0814   CREATININE 0.55 04/14/2013 0430       Component Value Date/Time   CALCIUM 8.7 05/20/2013 0814   CALCIUM 8.2* 04/14/2013 0430   ALKPHOS 55 05/20/2013 0814   ALKPHOS 50 04/13/2013 2030   AST 17 05/20/2013 0814   AST 26 04/13/2013 2030   ALT 24 05/20/2013 0814   ALT 36* 04/13/2013 2030   BILITOT 0.37 05/20/2013 0814   BILITOT 0.5 04/13/2013 2030  12/31/2012 ADDITIONAL INFORMATION: 1. CHROMOGENIC IN-SITU HYBRIDIZATION Interpretation: HER2/NEU BY CISH - SHOWS AMPLIFICATION BY CISH ANALYSIS. THE RATIO OF HER2: CEP 17 SIGNALS WAS 6.81 Reference range: Ratio: HER2:CEP17 < 1.8 gene amplification not observed Ratio: HER2:CEP 17 1.8-2.2 - equivocal result Ratio: HER2:CEP17 > 2.2 - gene amplification observed Pecola Leisure MD Pathologist, Electronic Signature ( Signed 01/11/2013) 1. PROGNOSTIC INDICATORS - ACIS Results IMMUNOHISTOCHEMICAL AND MORPHOMETRIC ANALYSIS BY THE AUTOMATED CELLULAR IMAGING SYSTEM (ACIS) Estrogen Receptor (Negative, <1%): 99%, STRONG STAINING INTENSITY Progesterone Receptor (Negative, <1%): 10%, STRONG STAINING INTENSITY Proliferation Marker Ki67 by M IB-1 (Low<20%): 46% All controls stained appropriately Pecola Leisure MD Pathologist, Electronic Signature ( Signed 01/06/2013) 1 of 4 FINAL for HULL, Damita G [phone]) FINAL DIAGNOSIS Diagnosis 1. Breast, simple mastectomy, Left - INVASIVE DUCTAL CARCINOMA, GRADE II (2.3 CM), SEE COMMENT. - LYMPHOVASCULAR INVASION IDENTIFIED - INVASIVE TUMOR IS 3 CM FROM THE NEAREST MARGIN (DEEP). - HIGH GRADE DUCTAL CARCINOMA IN SITU WITH COMEDO NECROSIS AND CALCIFICATIONS. - SEE TUMOR SYNOPTIC TEMPLATE BELOW. 2. Lymph node, sentinel, biopsy, Left axillary - ONE LYMPH NODE, NEGATIVE FOR TUMOR (0/1). 3. Breast, simple mastectomy, Right - BENIGN BREAST TISSUE, SEE COMMENT. - NEGATIVE FOR ATYPIA OR MALIGNANCY. 4. Lymph node, sentinel, biopsy, Right axillary - ONE LYMPH NODE, NEGATIVE FOR TUMOR (0/1). Microscopic Comment 1. BREAST, INVASIVE TUMOR, WITH LYMPH NODE  SAMPLING Specimen, including laterality: Left breast Procedure: Simple mastectomy Grade: II of III Tubule formation: 3 Nuclear pleomorphism: 2 Mitotic: 1 Tumor size (gross measurement) 2.3 cm Margins: Invasive, distance to closest margin: 3.0 cm In-situ, distance to closest margin: 3.0 cm (deep) If margin positive, focally or broadly: N/A Lymphovascular invasion: Present, extensive Ductal carcinoma in situ: Present Grade: III of III Extensive intraductal component: Absent Lobular neoplasia: Absent Tumor focality: Unifocal Treatment effect: None If present, treatment effect in breast tissue, lymph nodes or both: N/A Extent of tumor: Skin: Grossly negative for tumor Nipple: Grossly negative for tumor Skeletal muscle: Microscopically negative for tumor Lymph nodes: # examined: 1 Lymph nodes with metastasis: 0 Breast prognostic profile: Estrogen receptor: Repeated, previous study shows 100% positivity. [phone]) Progesterone receptor: Repeated, previous study demonstrated 86% positivity ([id number]) Her 2 neu: Pending and will be reported in an addendum. Ki-67: Pending and will be reported in an addendum. Non-neoplastic breast: No significant findings. TNM: pT2, pN0, pMX Comments: None. (CR:kh 01-04-13) 3. The entire 2.0 cm previous biopsy hematoma is submitted for histopathologic review. On review, there is 2 of 4 FINAL for HULL, Aziah G [phone]) Microscopic Comment(continued) no in situ or invasive carcinoma identified in any of the slide sections examined. Additional non-neoplastic findings include benign fibrocystic change, pseudoangiomatous stromal hyperplasia, fibroadenomatoid nodules, periductal chronic inflammation with associated stromal fibrosis and microcalcifications in benign ducts and lobules. Pathologic Stage is pTX pN0, PMX. The surgical resection margin(s) of the specimen were inked and microscopically evaluated. [place] RU RADIOGRAPHIC STUDIES:  Mr  Biopsy/wire Localization  12/25/2012  *RADIOLOGY REPORT*  Clinical Data:  6 mm enhancing mass in the upper right breast.  The patient has recent diagnosis of left breast DCIS.  MRI GUIDED VACUUM ASSISTED BIOPSY OF THE RIGHT BREAST WITHOUT AND WITH CONTRAST  Comparison: Previous exams.  Technique: Multiplanar, multisequence MR images of the right breast were obtained prior to and following the intravenous administration of 15 ml of Mulithance.  I met with the patient, and we discussed the procedure of MRI guided biopsy, including risks, benefits, and alternatives. Specifically, we discussed the risks of infection, bleeding, tissue injury, clip migration, and  inadequate sampling.  Informed, written consent was given.  Using sterile technique, 2% Lidocaine, MRI guidance, and a 9 gauge vacuum assisted device, biopsy was performed of a 6 mm enhancing mass using a lateral to medial approach.  At the conclusion of the procedure, a tissue marker clip was deployed into the biopsy cavity.  IMPRESSION: MRI guided biopsy of the right breast. No apparent complications.  THREE-DIMENSIONAL MR IMAGE RENDERING ON INDEPENDENT WORKSTATION:  Three-dimensional MR images were rendered by post-processing of the original MR data on an independent workstation.  The three- dimensional MR images were interpreted, and findings were reported in the accompanying complete MRI report for this study.   Original Report Authenticated By: Britta Mccreedy, M.D.    Nm Sentinel Node Inj-no Rpt (breast)  12/31/2012  CLINICAL DATA: right axillary sentinel node biopsy   Sulfur colloid was injected intradermally by the nuclear medicine  technologist for breast cancer sentinel node localization.     Nm Sentinel Node Inj-no Rpt (breast)  12/31/2012  CLINICAL DATA: left axillary sentinel node biopsy   Sulfur colloid was injected intradermally by the nuclear medicine  technologist for breast cancer sentinel node localization.     Mm Digital Diagnostic Unilat  R  12/25/2012  *RADIOLOGY REPORT*  Clinical Data:  MRI guided core needle biopsy of a 6 mm nodule of enhancement in the upper right breast was performed.  DIGITAL DIAGNOSTIC RIGHT MAMMOGRAM  Comparison:  Previous exams.  Findings:  Films are performed following MRI guided biopsy of 6 mm enhancing nodule in the upper central right breast.  A dumbbell shaped biopsy clip is present in the upper right breast just medial to the level of the nipple.  The clip is within a biopsy cavity that contains air and a small air-fluid level.  IMPRESSION: Satisfactory position of biopsy clip in the upper right breast.   Original Report Authenticated By: Britta Mccreedy, M.D.     ASSESSMENT: 57 year old [place] woman  (1) status post bilateral mastectomies in 12/31/2012 showing  (a) on the right, no evidence of malignancy  (b) on the left, a pT2 pN0, stage IIA invasive ductal carcinoma, grade 2, estrogen receptor 100% positive, progesterone receptor 86% positive, with an MIB-1 of 46%, and HER-2 amplification with a CISH ratio of 6.81  (2) starting adjuvant chemotherapy with carboplatin, docetaxel and trastuzumab February13 2014, to consist of 6 cycles.  #3 patient will proceed with Herceptin today.   #4 Overall she is tolerating it well. She does develop neutropenia with dehydration and fatigue. We are supporting her through this.   PLAN:   #1 Proceed with Herceptin.  Her next echo is tomorrow.    #2 She will return in one week's time for her last cycle of chemotherapy.  #3 She will refill her Cipro due to the elevated WBC and erythema on the right breast.  She will also call Dr. Leonie Green office to double check her breast tomorrow.       All questions were answered. The patient knows to call the clinic with any problems, questions or concerns. We can certainly see the patient much sooner if necessary  I spent 25 minutes counseling the patient face to face.  The total time spent in the appointment was 30  minutes.  Cherie Ouch Lyn Hollingshead, NP Medical Oncology [facility] Phone: [phone] 05/26/2013, 9:01 AM

## 2013-05-26 NOTE — Patient Instructions (Addendum)
Vancomycin injection What is this medicine? VANCOMYCIN Zenaida Niece koe MYE sin) is a glycopeptide antibiotic. It is used to treat certain kinds of bacterial infections. It will not work for colds, flu, or other viral infections. This medicine may be used for other purposes; ask your health care provider or pharmacist if you have questions. What should I tell my health care provider before I take this medicine? They need to know if you have any of these conditions: -dehydration -hearing loss -kidney disease -other chronic illness -an unusual or allergic reaction to vancomycin, other medicines, foods, dyes, or preservatives -pregnant or trying to get pregnant -breast-feeding How should I use this medicine? This medicine is infused into a vein. It is usually given by a health care provider in a hospital or clinic. If you receive this medicine at home, you will receive special instructions. Take your medicine at regular intervals. Do not take your medicine more often than directed. Take all of your medicine as directed even if you think you are better. Do not skip doses or stop your medicine early. It is important that you put your used needles and syringes in a special sharps container. Do not put them in a trash can. If you do not have a sharps container, call your pharmacist or healthcare provider to get one. Talk to your pediatrician regarding the use of this medicine in children. While this drug may be prescribed for even very young infants for selected conditions, precautions do apply. Overdosage: If you think you have taken too much of this medicine contact a poison control center or emergency room at once. NOTE: This medicine is only for you. Do not share this medicine with others. What if I miss a dose? If you miss a dose, take it as soon as you can. If it is almost time for your next dose, take only that dose. Do not take double or extra doses. What may interact with this  medicine? -amphotericin B -anesthetics -bacitracin -cisplatin -colistin -diuretics -other aminoglycoside antibiotics -polymyxin B This list may not describe all possible interactions. Give your health care provider a list of all the medicines, herbs, non-prescription drugs, or dietary supplements you use. Also tell them if you smoke, drink alcohol, or use illegal drugs. Some items may interact with your medicine. What should I watch for while using this medicine? Tell your doctor or health care professional if your symptoms do not improve or if you get new symptoms. Your condition and lab work will be monitored while you are taking this medicine. Do not treat diarrhea with over the counter products. Contact your doctor if you have diarrhea that lasts more than 2 days or if it is severe and watery. What side effects may I notice from receiving this medicine? Side effects that you should report to your doctor or health care professional as soon as possible: -allergic reactions like skin rash, itching or hives, swelling of the face, lips, or tongue -breathing difficulty, wheezing -change in amount, color of urine -change in hearing -chest pain -dizziness -fever, chills -flushing of the face and neck (reddening) -low blood pressure -redness, blistering, peeling or loosening of the skin, including inside the mouth -unusual bleeding or bruising -unusually weak or tired Side effects that usually do not require medical attention (report to your doctor or health care professional if they continue or are bothersome): -nausea, vomiting -pain, swelling where injected -stomach cramps This list may not describe all possible side effects. Call your doctor for medical advice about  side effects. You may report side effects to FDA at 1-800-FDA-1088. Where should I keep my medicine? Keep out of the reach of children. You will be instructed on how to store this medicine, if needed. Throw away any  unused medicine after the expiration date on the label. NOTE: This sheet is a summary. It may not cover all possible information. If you have questions about this medicine, talk to your doctor, pharmacist, or health care provider.  2012, Elsevier/Gold Standard. (09/13/2008 2:34:06 PM)

## 2013-05-26 NOTE — Patient Instructions (Signed)
Doing well.  Proceed with Herceptin tomorrow.  Refill your Cipro.  Please call us if you have any questions or concerns.

## 2013-05-27 ENCOUNTER — Ambulatory Visit (HOSPITAL_BASED_OUTPATIENT_CLINIC_OR_DEPARTMENT_OTHER): Payer: 59

## 2013-05-27 ENCOUNTER — Ambulatory Visit (HOSPITAL_COMMUNITY)
Admission: RE | Admit: 2013-05-27 | Discharge: 2013-05-27 | Disposition: A | Discharge status: Home / Self Care | Payer: 59 | Source: Ambulatory Visit | Attending: Adult Health | Admitting: Adult Health

## 2013-05-27 ENCOUNTER — Ambulatory Visit (HOSPITAL_BASED_OUTPATIENT_CLINIC_OR_DEPARTMENT_OTHER)
Admission: RE | Admit: 2013-05-27 | Discharge: 2013-05-27 | Disposition: A | Discharge status: Home / Self Care | Payer: 59 | Source: Ambulatory Visit | Attending: Internal Medicine | Admitting: Internal Medicine

## 2013-05-27 ENCOUNTER — Ambulatory Visit (HOSPITAL_COMMUNITY)
Admission: RE | Admit: 2013-05-27 | Discharge: 2013-05-27 | Disposition: A | Discharge status: Home / Self Care | Payer: 59 | Source: Ambulatory Visit | Attending: Internal Medicine | Admitting: Internal Medicine

## 2013-05-27 ENCOUNTER — Ambulatory Visit: Payer: 59

## 2013-05-27 ENCOUNTER — Other Ambulatory Visit: Payer: 59 | Admitting: Lab

## 2013-05-27 ENCOUNTER — Other Ambulatory Visit (HOSPITAL_BASED_OUTPATIENT_CLINIC_OR_DEPARTMENT_OTHER): Payer: 59 | Admitting: Lab

## 2013-05-27 ENCOUNTER — Ambulatory Visit (HOSPITAL_COMMUNITY)
Admission: RE | Admit: 2013-05-27 | Discharge: 2013-05-27 | Disposition: A | Discharge status: Home / Self Care | Payer: 59 | Source: Ambulatory Visit | Attending: Endocrinology | Admitting: Endocrinology

## 2013-05-27 ENCOUNTER — Ambulatory Visit: Payer: 59 | Admitting: Adult Health

## 2013-05-27 ENCOUNTER — Encounter (HOSPITAL_COMMUNITY): Payer: Self-pay

## 2013-05-27 ENCOUNTER — Other Ambulatory Visit (HOSPITAL_COMMUNITY): Payer: Self-pay | Admitting: Adult Health

## 2013-05-27 VITALS — BP 110/72 | HR 87 | Temp 98.3°F

## 2013-05-27 VITALS — BP 115/70 | HR 78 | Wt 159.8 lb

## 2013-05-27 DIAGNOSIS — C50919 Malignant neoplasm of unspecified site of unspecified female breast: Secondary | ICD-10-CM | POA: Insufficient documentation

## 2013-05-27 DIAGNOSIS — I079 Rheumatic tricuspid valve disease, unspecified: Secondary | ICD-10-CM | POA: Insufficient documentation

## 2013-05-27 DIAGNOSIS — C50912 Malignant neoplasm of unspecified site of left female breast: Secondary | ICD-10-CM

## 2013-05-27 DIAGNOSIS — I272 Pulmonary hypertension, unspecified: Secondary | ICD-10-CM

## 2013-05-27 DIAGNOSIS — C50419 Malignant neoplasm of upper-outer quadrant of unspecified female breast: Secondary | ICD-10-CM

## 2013-05-27 DIAGNOSIS — D709 Neutropenia, unspecified: Secondary | ICD-10-CM

## 2013-05-27 DIAGNOSIS — Z5111 Encounter for antineoplastic chemotherapy: Secondary | ICD-10-CM

## 2013-05-27 DIAGNOSIS — Z09 Encounter for follow-up examination after completed treatment for conditions other than malignant neoplasm: Secondary | ICD-10-CM

## 2013-05-27 DIAGNOSIS — I2789 Other specified pulmonary heart diseases: Secondary | ICD-10-CM | POA: Insufficient documentation

## 2013-05-27 DIAGNOSIS — N611 Abscess of the breast and nipple: Secondary | ICD-10-CM

## 2013-05-27 LAB — CBC WITH DIFFERENTIAL/PLATELET
BASO%: 0.4 % (ref 0.0–2.0)
Eosinophils Absolute: 0 10*3/uL (ref 0.0–0.5)
LYMPH%: 22.8 % (ref 14.0–49.7)
MCHC: 32.5 g/dL (ref 31.5–36.0)
MONO#: 0.8 10*3/uL (ref 0.1–0.9)
MONO%: 5.6 % (ref 0.0–14.0)
NEUT#: 9.8 10*3/uL — ABNORMAL HIGH (ref 1.5–6.5)
Platelets: 194 10*3/uL (ref 145–400)
RBC: 3.68 10*6/uL — ABNORMAL LOW (ref 3.70–5.45)
RDW: 16.1 % — ABNORMAL HIGH (ref 11.2–14.5)
WBC: 13.8 10*3/uL — ABNORMAL HIGH (ref 3.9–10.3)
nRBC: 0 % (ref 0–0)

## 2013-05-27 LAB — COMPREHENSIVE METABOLIC PANEL (CC13)
ALT: 20 U/L (ref 0–55)
AST: 18 U/L (ref 5–34)
Chloride: 107 mEq/L (ref 98–107)
Creatinine: 0.6 mg/dL (ref 0.6–1.1)
Sodium: 140 mEq/L (ref 136–145)
Total Bilirubin: 0.22 mg/dL (ref 0.20–1.20)
Total Protein: 6.2 g/dL — ABNORMAL LOW (ref 6.4–8.3)

## 2013-05-27 MED ORDER — TECHNETIUM TC 99M DIETHYLENETRIAME-PENTAACETIC ACID: 40.0000 | Freq: Once | INTRAVENOUS | Status: AC | PRN

## 2013-05-27 MED ORDER — SODIUM CHLORIDE 0.9 % IJ SOLN
10.0000 mL | INTRAMUSCULAR | Status: DC | PRN
  Administered 2013-05-27: 10 mL
  Filled 2013-05-27: qty 10

## 2013-05-27 MED ORDER — SODIUM CHLORIDE 0.9 % IV SOLN
2.0000 mg/kg | Freq: Once | INTRAVENOUS | Status: AC
  Administered 2013-05-27: 147 mg via INTRAVENOUS
  Filled 2013-05-27: qty 7

## 2013-05-27 MED ORDER — SODIUM CHLORIDE 0.9 % IV SOLN
Freq: Once | INTRAVENOUS | Status: AC
  Administered 2013-05-27: 09:00:00 via INTRAVENOUS

## 2013-05-27 MED ORDER — HEPARIN SOD (PORK) LOCK FLUSH 100 UNIT/ML IV SOLN
500.0000 [IU] | Freq: Once | INTRAVENOUS | Status: AC | PRN
  Administered 2013-05-27: 500 [IU]
  Filled 2013-05-27: qty 5

## 2013-05-27 MED ORDER — ACETAMINOPHEN 325 MG PO TABS
650.0000 mg | ORAL_TABLET | Freq: Once | ORAL | Status: AC
  Administered 2013-05-27: 650 mg via ORAL

## 2013-05-27 MED ORDER — VANCOMYCIN HCL 1000 MG IV SOLR
1250.0000 mg | Freq: Once | INTRAVENOUS | Status: AC
  Administered 2013-05-27: 1250 mg via INTRAVENOUS
  Filled 2013-05-27: qty 1250

## 2013-05-27 MED ORDER — TECHNETIUM TO 99M ALBUMIN AGGREGATED
6.0000 | Freq: Once | INTRAVENOUS | Status: AC | PRN
  Administered 2013-05-27: 6 via INTRAVENOUS

## 2013-05-27 NOTE — Progress Notes (Signed)
Patient ID: Jane Gutierrez, female   DOB: 12/05/1956, 57 y.o.   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 ER +100% PR +86% HER-2/neu (Sentinel nodes were negative for metastatic disease).  She is s/p bilateral mastectomies with reconstruction.    Plan for adjuvant chemotherapy and Herceptin.  On 02/04/13 she started Taxotere/carboplatinum to be given every 3 weeks for a total of 6 cycles. She will also receive Herceptin weekly for the duration of chemotherapy and then every 3 weeks to finish out one year.   Admitted to [facility] 04/13/13 with sepsis due strep throat and chest wound infection. Wound was revised in May. Had drain in x 3 weeks. Yesterday had some drainage from wound and WBC up. Started on vanc. Going to see Dr. Sanger today.  She returns for routine follow up today.  She will complete her 6th cycle of taxotere/carbo next week.  Denies SOB/PND/Orthopnea. Started exercising again. She is working 4-6 hours per day. Thyroid levels have been high. Synthroid cut back to 100 qod.  Echos: 01/26/13 EF 60% lateral S' 10.9 Grade II diastolic dysfunction 03/25/13 EF 60-65% lat s' 12.8  RV normal 05/27/13 EF lat S' 12.5 RV mildly dilated. Normal function. +RAE. IVC dilated 2.8 cm. Mod TR with mild prolapse of TV. RVSP 35-40.   Has severe iodine reaction    Review of Systems: All pertinent positives and negatives as in HPI, otherwise negative.   Past Medical History  Diagnosis Date  . IBS (irritable bowel syndrome)   . ADD (attention deficit disorder)   . Lyme disease   . Contact lens/glasses fitting     wears contacts or glasses  . Breast cancer 12/04/12    left, ER/PR +  . Allergy   . Asthma   . Thyroid disease   . Hypothyroidism     Current Outpatient Prescriptions  Medication Sig Dispense Refill  . albuterol (PROVENTIL HFA;VENTOLIN  HFA) 108 (90 BASE) MCG/ACT inhaler Inhale 2 puffs into the lungs every 6 (six) hours as needed for shortness of breath.       . B Complex-C (B-COMPLEX WITH VITAMIN C) tablet Take 1 tablet by mouth daily.      . dexamethasone (DECADRON) 4 MG tablet TAKE TWO TABLETS [8MG] BY MOUTH TWICE DAILY THE DAY BEFORE TAXOTERE.THEN TAKE TWO TABLETS [8MG] TWICE DAILY STARTING THE DAY AFTER CHEMO FOR THREE DAYS.  30 tablet  0  . dextroamphetamine (DEXTROSTAT) 5 MG tablet Take 2.5 mg by mouth 3 (three) times daily.       . EPINEPHrine (EPIPEN) 0.3 mg/0.3 mL DEVI Inject 0.3 mg into the muscle as needed (for allergic reaction).       . levothyroxine (SYNTHROID, LEVOTHROID) 100 MCG tablet Take 100 mcg by mouth daily.      . lidocaine-prilocaine (EMLA) cream Apply 1 application topically as needed (for port-a-cath access).      . loratadine (CLARITIN) 10 MG tablet Take 10 mg by mouth daily as needed (for 5 days after Neulasta injection).      . LORazepam (ATIVAN) 1 MG tablet Take 1 tablet (1 mg total) by mouth every 8 (eight) hours as needed for anxiety.  60 tablet  0  . Multiple Vitamin (MULTIVITAMIN WITH MINERALS) TABS Take 1 tablet by mouth daily.      . ondansetron (ZOFRAN ODT) 8 MG disintegrating   tablet Take 1 tablet (8 mg total) by mouth every 8 (eight) hours as needed for nausea.  20 tablet  5  . PRESCRIPTION MEDICATION Inject into the vein as directed. Herceptin infusion weekly on Thursdays; Taxotere and Paraplatin q3weeks with Neulasta support.      . prochlorperazine (COMPAZINE) 10 MG tablet Take 1 tablet (10 mg total) by mouth every 6 (six) hours as needed (Nausea or vomiting).  30 tablet  1  . prochlorperazine (COMPAZINE) 25 MG suppository Place 1 suppository (25 mg total) rectally every 12 (twelve) hours as needed for nausea.  12 suppository  0  . promethazine (PHENERGAN) 25 MG tablet Take 1/2 tablet to 1 tablet 12.5mg-25mg every 6 hours as needed for nausea.  30 tablet  1  . sennosides-docusate sodium  (SENOKOT-S) 8.6-50 MG tablet Take 1 tablet by mouth daily as needed for constipation.       . vitamin C (ASCORBIC ACID) 500 MG tablet Take 500 mg by mouth daily.      . bacitracin 500 UNIT/GM ointment Apply 1 application topically 2 (two) times daily. Applied to wound on breast.      . fluconazole (DIFLUCAN) 200 MG tablet Take 1 tablet (200 mg total) by mouth daily.  5 tablet  3   No current facility-administered medications for this encounter.   Facility-Administered Medications Ordered in Other Encounters  Medication Dose Route Frequency Provider Last Rate Last Dose  . sodium chloride 0.9 % injection 10 mL  10 mL Intracatheter PRN Kalsoom K Khan, MD   10 mL at 05/27/13 1227     Allergies  Allergen Reactions  . Betadine (Povidone Iodine)   . Dilaudid (Hydromorphone Hcl) Nausea And Vomiting  . Iodine     REACTION: anaphylactic  . Penicillins   . Shellfish-Derived Products   . Sulfamethoxazole W-Trimethoprim   . Cleocin (Clindamycin Hcl) Rash     PHYSICAL EXAM: Filed Vitals:   05/27/13 1359  BP: 115/70  Pulse: 78   General:  Well appearing. No respiratory difficulty HEENT: normal Neck: supple.  JVP doesn't appear to be elevated. Carotids 2+ bilat; no bruits. No lymphadenopathy or thryomegaly appreciated. Cor: PMI nondisplaced. Regular rate & rhythm. No rubs, gallops 2/6 TR. No RV lift Lungs: clear Abdomen: soft, nontender, nondistended. No hepatosplenomegaly. No bruits or masses. Good bowel sounds. Extremities: no cyanosis, clubbing, rash, tr edema. No cords Neuro: alert & oriented x 3, cranial nerves grossly intact. moves all 4 extremities w/o difficulty. Affect pleasant.   ASSESSMENT & PLAN:  

## 2013-05-27 NOTE — Patient Instructions (Addendum)
Follow up in 3 months with an ECHO 

## 2013-05-27 NOTE — Assessment & Plan Note (Addendum)
See breast CA section. Echo shows pHTN and RV strain. Concern for PE. Will get VQ today. If positive will need Xarelto. If negative will need to consider TEE to further evaluate TV.

## 2013-05-27 NOTE — Assessment & Plan Note (Signed)
I reviewed echos personally. EF and Doppler parameters stable as LV is concerned. Ok to continue Herceptin. However there is evidence of right sided strain and pHTN which is new from previous. With h/o breast CA and recent hospitalization concern for PE is present. With iodine allergy will proceed with VQ today.

## 2013-05-27 NOTE — Progress Notes (Signed)
  Echocardiogram 2D Echocardiogram has been performed.  Amia Rynders FRANCES 05/27/2013, 2:07 PM

## 2013-05-27 NOTE — Patient Instructions (Addendum)
[facility] Discharge Instructions for Patients Receiving Chemotherapy  Today you received the following chemotherapy agents: Herceptin and Vancomycin.  To help prevent nausea and vomiting after your treatment, we encourage you to take your nausea medication.    If you develop nausea and vomiting that is not controlled by your nausea medication, call the clinic.   BELOW ARE SYMPTOMS THAT SHOULD BE REPORTED IMMEDIATELY:  *FEVER GREATER THAN 100.5 F  *CHILLS WITH OR WITHOUT FEVER  NAUSEA AND VOMITING THAT IS NOT CONTROLLED WITH YOUR NAUSEA MEDICATION  *UNUSUAL SHORTNESS OF BREATH  *UNUSUAL BRUISING OR BLEEDING  TENDERNESS IN MOUTH AND THROAT WITH OR WITHOUT PRESENCE OF ULCERS  *URINARY PROBLEMS  *BOWEL PROBLEMS  UNUSUAL RASH Items with * indicate a potential emergency and should be followed up as soon as possible.  Feel free to call the clinic you have any questions or concerns. The clinic phone number is [phone].

## 2013-05-31 ENCOUNTER — Telehealth (HOSPITAL_COMMUNITY): Payer: Self-pay

## 2013-05-31 NOTE — Telephone Encounter (Signed)
Per Dr Gala Romney pt will need TEE since she was neg for PE, pt is aware and will sch for 6/13 will call her back with time and instructions

## 2013-05-31 NOTE — Telephone Encounter (Signed)
[name] called stated she received a voicemail about a test she needs to have in the future. She stated she had a screen that was negative last week. Call back phone number [phone]

## 2013-05-31 NOTE — Progress Notes (Signed)
[facility]  [phone]    OFFICE PROGRESS NOTE  CC  [name], MD [address] [place] [address] Dr. Emelia Loron Dr. Chipper Herb Dr. Starleen Arms Dr. Dorothy Puffer  DIAGNOSIS: 57 year old female who presented on 12/24/2012 which new diagnosis of breast cancer  PRIOR THERAPY:  #1 patient has multiple medical problems but recently she had a mammogram performed that showed a 5.4 cm area of calcifications. MRI of the breasts showed the area extended to about 7.2 cm. Needle core biopsy performed showed a high-grade DCIS that was ER positive PR positive.  #2 she was seen by Dr. Emelia Loron regarding surgical options. Patient wanted to have bilateral mastectomies. She went on to have this performed on 12/31/2012. The final pathology did reveal a 2.3 cm invasive ductal carcinoma in the left breast. The tumor was ER +100% PR +86% HER-2/neu was amplified with a ratio of 6.81. Ki-67 was elevated at 46%. She has had immediate reconstruction.  #3 patient is seen back in medical oncology for discussion of adjuvant treatment. Since patient is HER-2/neu positive recommendation is her 2-based therapy consisting of Taxotere carboplatinum and Herceptin. The Taxotere or and carboplatinum will be given every 3 weeks for 6 cycles. She will receive Neulasta on day 2. Herceptin will be given weekly with duration of chemotherapy and then every 3 weeks to finish out a one-year of treatment. Patient also understands that she will be placed on antiestrogen therapy with an aromatase inhibitor or tamoxifen.  TCH was started on 02/04/13.  CURRENT THERAPY:  Cycle 5 TCH  INTERVAL HISTORY: Patient returns in followup today prior to her [facility].  She is doing well today [name] then followed up with .  The wound was cultured on 4/24 and grew gram + cocci representing staph.  No other cultures were positive in the hospital.  She has had low grade fevers, her last fever was  this past Monday.  Otherwise, a 10 point ROS is neg.    MEDICAL HISTORY: Past Medical History  Diagnosis Date  . IBS (irritable bowel syndrome)   . ADD (attention deficit disorder)   . Lyme disease   . Contact lens/glasses fitting     wears contacts or glasses  . Breast cancer 12/04/12    left, ER/PR +  . Allergy   . Asthma   . Thyroid disease   . Hypothyroidism     ALLERGIES:  is allergic to betadine; dilaudid; iodine; penicillins; shellfish-derived products; sulfamethoxazole w-trimethoprim; and cleocin.  MEDICATIONS:  Current Outpatient Prescriptions  Medication Sig Dispense Refill  . B Complex-C (B-COMPLEX WITH VITAMIN C) tablet Take 1 tablet by mouth daily.      . bacitracin 500 UNIT/GM ointment Apply 1 application topically 2 (two) times daily. Applied to wound on breast.      . dextroamphetamine (DEXTROSTAT) 5 MG tablet Take 2.5 mg by mouth 3 (three) times daily.       [name] levothyroxine (SYNTHROID, LEVOTHROID) 100 MCG tablet Take 100 mcg by mouth daily.      [name] lidocaine-prilocaine (EMLA) cream Apply 1 application topically as needed (for port-a-cath access).      [name] loratadine (CLARITIN) 10 MG tablet Take 10 mg by mouth daily as needed (for 5 days after Neulasta injection).      . Multiple Vitamin (MULTIVITAMIN WITH MINERALS) TABS Take 1 tablet by mouth daily.      . sennosides-docusate sodium (SENOKOT-S) 8.6-50 MG tablet Take 1 tablet by mouth daily as needed for  constipation.       . vitamin C (ASCORBIC ACID) 500 MG tablet Take 500 mg by mouth daily.      [name] albuterol (PROVENTIL HFA;VENTOLIN HFA) 108 (90 BASE) MCG/ACT inhaler Inhale 2 puffs into the lungs every 6 (six) hours as needed for shortness of breath.       . dexamethasone (DECADRON) 4 MG tablet TAKE TWO TABLETS [8MG ] BY MOUTH TWICE DAILY THE DAY BEFORE TAXOTERE.THEN TAKE TWO TABLETS [8MG ] TWICE DAILY STARTING THE DAY AFTER CHEMO FOR THREE DAYS.  30 tablet  0  . EPINEPHrine (EPIPEN) 0.3 mg/0.3 mL DEVI Inject 0.3 mg into  the muscle as needed (for allergic reaction).       . fluconazole (DIFLUCAN) 200 MG tablet Take 1 tablet (200 mg total) by mouth daily.  5 tablet  3  . LORazepam (ATIVAN) 1 MG tablet Take 1 tablet (1 mg total) by mouth every 8 (eight) hours as needed for anxiety.  60 tablet  0  . ondansetron (ZOFRAN ODT) 8 MG disintegrating tablet Take 1 tablet (8 mg total) by mouth every 8 (eight) hours as needed for nausea.  20 tablet  5  . PRESCRIPTION MEDICATION Inject into the vein as directed. Herceptin infusion weekly on Thursdays; Taxotere and Paraplatin q3weeks with Neulasta support.      . prochlorperazine (COMPAZINE) 10 MG tablet Take 1 tablet (10 mg total) by mouth every 6 (six) hours as needed (Nausea or vomiting).  30 tablet  1  . prochlorperazine (COMPAZINE) 25 MG suppository Place 1 suppository (25 mg total) rectally every 12 (twelve) hours as needed for nausea.  12 suppository  0  . promethazine (PHENERGAN) 25 MG tablet Take 1/2 tablet to 1 tablet 12.5mg -25mg  every 6 hours as needed for nausea.  30 tablet  1   No current facility-administered medications for this visit.    SURGICAL HISTORY:  Past Surgical History  Procedure Laterality Date  . Brain surgery      removal benign frontal lobe cyst in 1987  . Foot surgery  2013    bilateral  . Gynecologic cryosurgery      cervical cancer, 1998 last remained disease free with recent normal pap  . Diagnostic laparoscopy  2004    lysis adh-uterine ablation  . Breast surgery      bilateral mastopexy  . Breast surgery      removal benign left breast lesion  . Abdominal hysterectomy  2005    TAH/BSO-lysis adh, enometriosis  . Face lift    . Axillary sentinel node biopsy  12/31/2012    Procedure: AXILLARY SENTINEL NODE BIOPSY;  Surgeon: Emelia Loron, MD;  Location: [facility];  Service: General;  Laterality: Bilateral;  bilateral sentinel node  . Total mastectomy  12/31/2012    Procedure: TOTAL MASTECTOMY;  Surgeon: Emelia Loron, MD;  Location: [facility];  Service: General;  Laterality: Bilateral;  bilateral total mastectomies  . Breast reconstruction with placement of tissue expander and flex hd (acellular hydrated dermis)  12/31/2012    Procedure: BREAST RECONSTRUCTION WITH PLACEMENT OF TISSUE EXPANDER AND FLEX HD (ACELLULAR HYDRATED DERMIS);  Surgeon: Wayland Denis, DO;  Location: [facility];  Service: Plastics;  Laterality: Bilateral;  bilateral immediate breast reconstruction with expanders and flex hd   . Portacath placement  01/27/2013    Procedure: INSERTION PORT-A-CATH;  Surgeon: Emelia Loron, MD;  Location:  SURGERY CENTER;  Service: General;  Laterality: Right;  . Incision and drainage of wound Left  04/28/2013    Procedure: IRRIGATION AND DEBRIDEMENT LEFT BREAST WOUND, POSSIBLE CLOSURE OF WOUND WITH DRAIN PLACEMENT;  Surgeon: Wayland Denis, DO;  Location: [facility];  Service: Plastics;  Laterality: Left;    REVIEW OF SYSTEMS:  General: fatigue (+), night sweats (-), fever (+), pain (-) Lymph: palpable nodes (-) HEENT: vision changes (-), mucositis (-), gum bleeding (-), epistaxis (-) Cardiovascular: chest pain (-), palpitations (-) Pulmonary: shortness of breath (-), dyspnea on exertion (-), cough (-), hemoptysis (-) GI:  Early satiety (-), melena (-), dysphagia (-), nausea/vomiting (-), diarrhea (-) GU: dysuria (-), hematuria (-), incontinence (-) Musculoskeletal: joint swelling (-), joint pain (-), back pain (-) Neuro: weakness (-), numbness (-), headache (-), confusion (-) Skin: Rash (-), lesions (-), dryness (-) Psych: depression (-), suicidal/homicidal ideation (-), feeling of hopelessness (-)  PHYSICAL EXAMINATION:  Blood pressure 108/65, pulse 80, temperature 97.3 F (36.3 C), temperature source Oral, resp. rate 20, height 5\' 5"  (1.651 m), weight 158 lb 6.4 oz (71.85 kg). Body mass index is 26.36 kg/(m^2).   ECOG PERFORMANCE  STATUS: 1 - Symptomatic but completely ambulatory  General: Patient is a well appearing female in no acute distress HEENT: PERRLA, sclerae anicteric no conjunctival pallor, MMM Neck: supple, no palpable adenopathy Lungs: clear to auscultation bilaterally, no wheezes, rhonchi, or rales. Right port negative for discharge, tenderness or erythema. Cardiovascular: regular rate rhythm, S1, S2, no murmurs, rubs or gallops Abdomen: Soft, non-tender, non-distended, normoactive bowel sounds, no HSM Extremities: warm and well perfused, no clubbing, cyanosis, or edema Skin: No rashes or lesions Neuro: Non-focal Breasts: She status post bilateral mastectomies with expander is in place. The cosmetic result is symmetrical.  Both axillae are benign. She has an approximately 0.5 cm open lesion at the lower central portion of the left breast, it is draining yellow serous fluids, and has yellow tissue present. There is minimal mild surrounding erythema, no foul odor present.    LABORATORY DATA: Lab Results  Component Value Date   WBC 13.8* 05/27/2013   HGB 10.6* 05/27/2013   HCT 32.6* 05/27/2013   MCV 88.6 05/27/2013   PLT 194 05/27/2013      Chemistry      Component Value Date/Time   NA 140 05/27/2013 0847   NA 137 04/14/2013 0430   K 4.0 05/27/2013 0847   K 3.9 04/14/2013 0430   CL 107 05/27/2013 0847   CL 103 04/14/2013 0430   CO2 26 05/27/2013 0847   CO2 27 04/14/2013 0430   BUN 22.9 05/27/2013 0847   BUN 13 04/14/2013 0430   CREATININE 0.6 05/27/2013 0847   CREATININE 0.55 04/14/2013 0430      Component Value Date/Time   CALCIUM 8.6 05/27/2013 0847   CALCIUM 8.2* 04/14/2013 0430   ALKPHOS 51 05/27/2013 0847   ALKPHOS 50 04/13/2013 2030   AST 18 05/27/2013 0847   AST 26 04/13/2013 2030   ALT 20 05/27/2013 0847   ALT 36* 04/13/2013 2030   BILITOT 0.22 05/27/2013 0847   BILITOT 0.5 04/13/2013 2030     12/31/2012 ADDITIONAL INFORMATION: 1. CHROMOGENIC IN-SITU HYBRIDIZATION Interpretation: HER2/NEU BY CISH - SHOWS  AMPLIFICATION BY CISH ANALYSIS. THE RATIO OF HER2: CEP 17 SIGNALS WAS 6.81 Reference range: Ratio: HER2:CEP17 < 1.8 gene amplification not observed Ratio: HER2:CEP 17 1.8-2.2 - equivocal result Ratio: HER2:CEP17 > 2.2 - gene amplification observed Pecola Leisure MD Pathologist, Electronic Signature ( Signed 01/11/2013) 1. PROGNOSTIC INDICATORS - ACIS Results IMMUNOHISTOCHEMICAL AND MORPHOMETRIC ANALYSIS BY THE AUTOMATED CELLULAR  IMAGING SYSTEM (ACIS) Estrogen Receptor (Negative, <1%): 99%, STRONG STAINING INTENSITY Progesterone Receptor (Negative, <1%): 10%, STRONG STAINING INTENSITY Proliferation Marker Ki67 by M IB-1 (Low<20%): 46% All controls stained appropriately Pecola Leisure MD Pathologist, Electronic Signature ( Signed 01/06/2013) 1 of 4 FINAL for HULL, Jane Gutierrez [id number]) FINAL DIAGNOSIS Diagnosis 1. Breast, simple mastectomy, Left - INVASIVE DUCTAL CARCINOMA, GRADE II (2.3 CM), SEE COMMENT. - LYMPHOVASCULAR INVASION IDENTIFIED - INVASIVE TUMOR IS 3 CM FROM THE NEAREST MARGIN (DEEP). - HIGH GRADE DUCTAL CARCINOMA IN SITU WITH COMEDO NECROSIS AND CALCIFICATIONS. - SEE TUMOR SYNOPTIC TEMPLATE BELOW. 2. Lymph node, sentinel, biopsy, Left axillary - ONE LYMPH NODE, NEGATIVE FOR TUMOR (0/1). 3. Breast, simple mastectomy, Right - BENIGN BREAST TISSUE, SEE COMMENT. - NEGATIVE FOR ATYPIA OR MALIGNANCY. 4. Lymph node, sentinel, biopsy, Right axillary - ONE LYMPH NODE, NEGATIVE FOR TUMOR (0/1). Microscopic Comment 1. BREAST, INVASIVE TUMOR, WITH LYMPH NODE SAMPLING Specimen, including laterality: Left breast Procedure: Simple mastectomy Grade: II of III Tubule formation: 3 Nuclear pleomorphism: 2 Mitotic: 1 Tumor size (gross measurement) 2.3 cm Margins: Invasive, distance to closest margin: 3.0 cm In-situ, distance to closest margin: 3.0 cm (deep) If margin positive, focally or broadly: N/A Lymphovascular invasion: Present, extensive Ductal carcinoma in situ:  Present Grade: III of III Extensive intraductal component: Absent Lobular neoplasia: Absent Tumor focality: Unifocal Treatment effect: None If present, treatment effect in breast tissue, lymph nodes or both: N/A Extent of tumor: Skin: Grossly negative for tumor Nipple: Grossly negative for tumor Skeletal muscle: Microscopically negative for tumor Lymph nodes: # examined: 1 Lymph nodes with metastasis: 0 Breast prognostic profile: Estrogen receptor: Repeated, previous study shows 100% positivity. [id number]) Progesterone receptor: Repeated, previous study demonstrated 86% positivity ([id number]) Her 2 neu: Pending and will be reported in an addendum. Ki-67: Pending and will be reported in an addendum. Non-neoplastic breast: No significant findings. TNM: pT2, pN0, pMX Comments: None. (CR:kh 01-04-13) 3. The entire 2.0 cm previous biopsy hematoma is submitted for histopathologic review. On review, there is 2 of 4 FINAL for HULL, Jane Gutierrez [phone]) Microscopic Comment(continued) no in situ or invasive carcinoma identified in any of the slide sections examined. Additional non-neoplastic findings include benign fibrocystic change, pseudoangiomatous stromal hyperplasia, fibroadenomatoid nodules, periductal chronic inflammation with associated stromal fibrosis and microcalcifications in benign ducts and lobules. Pathologic Stage is pTX pN0, PMX. The surgical resection margin(s) of the specimen were inked and microscopically evaluated. Jane Gutierrez RADIOGRAPHIC STUDIES:  Mr Biopsy/wire Localization  12/25/2012  *RADIOLOGY REPORT*  Clinical Data:  6 mm enhancing mass in the upper right breast.  The patient has recent diagnosis of left breast DCIS.  MRI GUIDED VACUUM ASSISTED BIOPSY OF THE RIGHT BREAST WITHOUT AND WITH CONTRAST  Comparison: Previous exams.  Technique: Multiplanar, multisequence MR images of the right breast were obtained prior to and following the intravenous administration of 15  ml of Mulithance.  I met with the patient, and we discussed the procedure of MRI guided biopsy, including risks, benefits, and alternatives. Specifically, we discussed the risks of infection, bleeding, tissue injury, clip migration, and inadequate sampling.  Informed, written consent was given.  Using sterile technique, 2% Lidocaine, MRI guidance, and a 9 gauge vacuum assisted device, biopsy was performed of a 6 mm enhancing mass using a lateral to medial approach.  At the conclusion of the procedure, a tissue marker clip was deployed into the biopsy cavity.  IMPRESSION: MRI guided biopsy of the right breast. No apparent complications.  THREE-DIMENSIONAL MR IMAGE RENDERING ON INDEPENDENT  WORKSTATION:  Three-dimensional MR images were rendered by post-processing of the original MR data on an independent workstation.  The three- dimensional MR images were interpreted, and findings were reported in the accompanying complete MRI report for this study.   Original Report Authenticated By: Britta Mccreedy, M.D.    Nm Sentinel Node Inj-no Rpt (breast)  12/31/2012  CLINICAL DATA: right axillary sentinel node biopsy   Sulfur colloid was injected intradermally by the nuclear medicine  technologist for breast cancer sentinel node localization.     Nm Sentinel Node Inj-no Rpt (breast)  12/31/2012  CLINICAL DATA: left axillary sentinel node biopsy   Sulfur colloid was injected intradermally by the nuclear medicine  technologist for breast cancer sentinel node localization.     Mm Digital Diagnostic Unilat R  12/25/2012  *RADIOLOGY REPORT*  Clinical Data:  MRI guided core needle biopsy of a 6 mm nodule of enhancement in the upper right breast was performed.  DIGITAL DIAGNOSTIC RIGHT MAMMOGRAM  Comparison:  Previous exams.  Findings:  Films are performed following MRI guided biopsy of 6 mm enhancing nodule in the upper central right breast.  A dumbbell shaped biopsy clip is present in the upper right breast just medial to the  level of the nipple.  The clip is within a biopsy cavity that contains air and a small air-fluid level.  IMPRESSION: Satisfactory position of biopsy clip in the upper right breast.   Original Report Authenticated By: Britta Mccreedy, M.D.     ASSESSMENT: 57 year old [place] woman  (1) status post bilateral mastectomies in 12/31/2012 showing  (a) on the right, no evidence of malignancy  (b) on the left, a pT2 pN0, stage IIA invasive ductal carcinoma, grade 2, estrogen receptor 100% positive, progesterone receptor 86% positive, with an MIB-1 of 46%, and HER-2 amplification with a CISH ratio of 6.81  (2) starting adjuvant chemotherapy with carboplatin, docetaxel and trastuzumab February13 2014, to consist of 6 cycles.  #3 she will receive cycle #5 of TCH today.  #4 verall she is tolerating it well. She does develop neutropenia with dehydration and fatigue. We are supporting her through this.Her counts today are adequate.    PLAN:  #1 patient will proceed with cycle 5 of TCH.  #2 she will return in one week's time for Herceptin only.    All questions were answered. The patient knows to call the clinic with any problems, questions or concerns. We can certainly see the patient much sooner if necessary  I spent 25 minutes counseling the patient face to face.  The total time spent in the appointment was 30 minutes.  Drue Second, MD Medical/Oncology [facility] [phone] (beeper) [phone] (Office)

## 2013-06-01 ENCOUNTER — Encounter: Payer: Self-pay | Admitting: Adult Health

## 2013-06-01 ENCOUNTER — Encounter (HOSPITAL_COMMUNITY): Payer: Self-pay | Admitting: *Deleted

## 2013-06-01 ENCOUNTER — Other Ambulatory Visit (HOSPITAL_COMMUNITY): Payer: Self-pay | Admitting: Adult Health

## 2013-06-01 DIAGNOSIS — I071 Rheumatic tricuspid insufficiency: Secondary | ICD-10-CM

## 2013-06-01 NOTE — Telephone Encounter (Signed)
TEE 6/13 at 2pm, pt aware and instructions reviewed with her via phone

## 2013-06-02 ENCOUNTER — Encounter: Payer: Self-pay | Admitting: Oncology

## 2013-06-02 NOTE — Progress Notes (Signed)
Faxed clinical information to [facility] @ [phone].

## 2013-06-03 ENCOUNTER — Ambulatory Visit (HOSPITAL_BASED_OUTPATIENT_CLINIC_OR_DEPARTMENT_OTHER): Payer: 59 | Admitting: Oncology

## 2013-06-03 ENCOUNTER — Other Ambulatory Visit (HOSPITAL_BASED_OUTPATIENT_CLINIC_OR_DEPARTMENT_OTHER): Payer: 59 | Admitting: Lab

## 2013-06-03 ENCOUNTER — Ambulatory Visit (HOSPITAL_BASED_OUTPATIENT_CLINIC_OR_DEPARTMENT_OTHER): Payer: 59

## 2013-06-03 ENCOUNTER — Telehealth: Payer: Self-pay | Admitting: Oncology

## 2013-06-03 ENCOUNTER — Telehealth: Payer: Self-pay | Admitting: *Deleted

## 2013-06-03 VITALS — BP 110/63 | HR 90 | Temp 98.2°F | Resp 20 | Ht 65.0 in | Wt 157.9 lb

## 2013-06-03 DIAGNOSIS — Z5112 Encounter for antineoplastic immunotherapy: Secondary | ICD-10-CM

## 2013-06-03 DIAGNOSIS — Z170 Estrogen receptor positive status [ER+]: Secondary | ICD-10-CM

## 2013-06-03 DIAGNOSIS — C50419 Malignant neoplasm of upper-outer quadrant of unspecified female breast: Secondary | ICD-10-CM

## 2013-06-03 DIAGNOSIS — L539 Erythematous condition, unspecified: Secondary | ICD-10-CM

## 2013-06-03 LAB — CBC WITH DIFFERENTIAL/PLATELET
BASO%: 0.1 % (ref 0.0–2.0)
HCT: 32.8 % — ABNORMAL LOW (ref 34.8–46.6)
MCHC: 33.2 g/dL (ref 31.5–36.0)
MONO#: 0.6 10*3/uL (ref 0.1–0.9)
NEUT%: 59.5 % (ref 38.4–76.8)
WBC: 10 10*3/uL (ref 3.9–10.3)
lymph#: 3.4 10*3/uL — ABNORMAL HIGH (ref 0.9–3.3)
nRBC: 0 % (ref 0–0)

## 2013-06-03 LAB — COMPREHENSIVE METABOLIC PANEL (CC13)
ALT: 25 U/L (ref 0–55)
AST: 20 U/L (ref 5–34)
Albumin: 2.9 g/dL — ABNORMAL LOW (ref 3.5–5.0)
BUN: 27 mg/dL — ABNORMAL HIGH (ref 7.0–26.0)
Calcium: 8.8 mg/dL (ref 8.4–10.4)
Chloride: 104 mEq/L (ref 98–107)
Potassium: 3.8 mEq/L (ref 3.5–5.1)
Sodium: 140 mEq/L (ref 136–145)
Total Protein: 6.6 g/dL (ref 6.4–8.3)

## 2013-06-03 MED ORDER — ACETAMINOPHEN 325 MG PO TABS
650.0000 mg | ORAL_TABLET | Freq: Once | ORAL | Status: AC
  Administered 2013-06-03: 650 mg via ORAL

## 2013-06-03 MED ORDER — TRASTUZUMAB CHEMO INJECTION 440 MG
2.0000 mg/kg | Freq: Once | INTRAVENOUS | Status: AC
  Administered 2013-06-03: 147 mg via INTRAVENOUS
  Filled 2013-06-03: qty 7

## 2013-06-03 MED ORDER — HEPARIN SOD (PORK) LOCK FLUSH 100 UNIT/ML IV SOLN
500.0000 [IU] | Freq: Once | INTRAVENOUS | Status: AC | PRN
  Administered 2013-06-03: 500 [IU]
  Filled 2013-06-03: qty 5

## 2013-06-03 MED ORDER — CEPHALEXIN 500 MG PO CAPS: 500.0000 mg | ORAL_CAPSULE | Freq: Four times a day (QID) | ORAL | Status: DC

## 2013-06-03 MED ORDER — SODIUM CHLORIDE 0.9 % IV SOLN
Freq: Once | INTRAVENOUS | Status: AC
  Administered 2013-06-03: 14:00:00 via INTRAVENOUS

## 2013-06-03 MED ORDER — SODIUM CHLORIDE 0.9 % IJ SOLN
10.0000 mL | INTRAMUSCULAR | Status: DC | PRN
  Administered 2013-06-03: 10 mL
  Filled 2013-06-03: qty 10

## 2013-06-03 MED ORDER — LETROZOLE 2.5 MG PO TABS: 2.5000 mg | ORAL_TABLET | Freq: Every day | ORAL | Status: DC

## 2013-06-03 NOTE — Progress Notes (Signed)
Patient discharged to home, ambulatory in no distress.

## 2013-06-03 NOTE — Progress Notes (Signed)
Patient to receive Herceptin only today.  Reports she does not receive benadryl because this makes her too drowsy.

## 2013-06-03 NOTE — Telephone Encounter (Signed)
Per staff message and POF I have scheduled appts.  JMW  

## 2013-06-03 NOTE — Patient Instructions (Signed)
[facility] Discharge Instructions for Patients Receiving Chemotherapy  Today you received the following antibody agent herceptin.  To help prevent nausea and vomiting after your treatment, we encourage you to take your nausea medication Phenergan, compazine, Zofran ODT.   If you develop nausea and vomiting that is not controlled by your nausea medication, call the clinic.   BELOW ARE SYMPTOMS THAT SHOULD BE REPORTED IMMEDIATELY:  *FEVER GREATER THAN 100.5 F  *CHILLS WITH OR WITHOUT FEVER  NAUSEA AND VOMITING THAT IS NOT CONTROLLED WITH YOUR NAUSEA MEDICATION  *UNUSUAL SHORTNESS OF BREATH  *UNUSUAL BRUISING OR BLEEDING  TENDERNESS IN MOUTH AND THROAT WITH OR WITHOUT PRESENCE OF ULCERS  *URINARY PROBLEMS  *BOWEL PROBLEMS  UNUSUAL RASH Items with * indicate a potential emergency and should be followed up as soon as possible.  Feel free to call the clinic you have any questions or concerns. The clinic phone number is [phone].

## 2013-06-03 NOTE — Patient Instructions (Addendum)
Proceed with herceptin today  We will see you back on 6/19  New prescription for keflex  Begin on letrozole as discussed  Letrozole tablets What is this medicine? LETROZOLE (LET roe zole) blocks the production of estrogen. Certain types of breast cancer grow under the influence of estrogen. Letrozole helps block tumor growth. This medicine is used to treat advanced breast cancer in postmenopausal women. This medicine may be used for other purposes; ask your health care provider or pharmacist if you have questions. What should I tell my health care provider before I take this medicine? They need to know if you have any of these conditions: -liver disease -osteoporosis (weak bones) -an unusual or allergic reaction to letrozole, other medicines, foods, dyes, or preservatives -pregnant or trying to get pregnant -breast-feeding How should I use this medicine? Take this medicine by mouth with a glass of water. You may take it with or without food. Follow the directions on the prescription label. Take your medicine at regular intervals. Do not take your medicine more often than directed. Do not stop taking except on your doctor's advice. Talk to your pediatrician regarding the use of this medicine in children. Special care may be needed. Overdosage: If you think you have taken too much of this medicine contact a poison control center or emergency room at once. NOTE: This medicine is only for you. Do not share this medicine with others. What if I miss a dose? If you miss a dose, take it as soon as you can. If it is almost time for your next dose, take only that dose. Do not take double or extra doses. What may interact with this medicine? Do not take this medicine with any of the following medications: -estrogens, like hormone replacement therapy or birth control pills This medicine may also interact with the following medications: -dietary supplements such as androstenedione or  DHEA -prasterone -tamoxifen This list may not describe all possible interactions. Give your health care provider a list of all the medicines, herbs, non-prescription drugs, or dietary supplements you use. Also tell them if you smoke, drink alcohol, or use illegal drugs. Some items may interact with your medicine. What should I watch for while using this medicine? Visit your doctor or health care professional for regular check-ups to monitor your condition. Do not use this drug if you are pregnant. Serious side effects to an unborn child are possible. Talk to your doctor or pharmacist for more information. You may get drowsy or dizzy. Do not drive, use machinery, or do anything that needs mental alertness until you know how this medicine affects you. Do not stand or sit up quickly, especially if you are an older patient. This reduces the risk of dizzy or fainting spells. What side effects may I notice from receiving this medicine? Side effects that you should report to your doctor or health care professional as soon as possible: -allergic reactions like skin rash, itching, or hives -bone fracture -chest pain -difficulty breathing or shortness of breath -severe pain, swelling, warmth in the leg -unusually weak or tired -vaginal bleeding Side effects that usually do not require medical attention (report to your doctor or health care professional if they continue or are bothersome): -bone, back, joint, or muscle pain -dizziness -fatigue -fluid retention -headache -hot flashes, night sweats -nausea -weight gain This list may not describe all possible side effects. Call your doctor for medical advice about side effects. You may report side effects to FDA at 1-800-FDA-1088. Where should I  keep my medicine? Keep out of the reach of children. Store between 15 and 30 degrees C (59 and 86 degrees F). Throw away any unused medicine after the expiration date. NOTE: This sheet is a summary. It may  not cover all possible information. If you have questions about this medicine, talk to your doctor, pharmacist, or health care provider.  2012, [employer]vier/Gold Standard. (02/19/2008 4:43:44 PM)

## 2013-06-04 ENCOUNTER — Ambulatory Visit (HOSPITAL_COMMUNITY)
Admission: RE | Admit: 2013-06-04 | Discharge: 2013-06-04 | Disposition: A | Discharge status: Home / Self Care | Payer: 59 | Source: Ambulatory Visit | Attending: Internal Medicine | Admitting: Internal Medicine

## 2013-06-04 ENCOUNTER — Ambulatory Visit: Payer: 59

## 2013-06-04 ENCOUNTER — Encounter (HOSPITAL_COMMUNITY): Payer: Self-pay | Admitting: *Deleted

## 2013-06-04 ENCOUNTER — Encounter (HOSPITAL_COMMUNITY)
Admission: RE | Disposition: A | Discharge status: Home / Self Care | Payer: Self-pay | Source: Ambulatory Visit | Attending: Internal Medicine

## 2013-06-04 DIAGNOSIS — I369 Nonrheumatic tricuspid valve disorder, unspecified: Secondary | ICD-10-CM

## 2013-06-04 DIAGNOSIS — I079 Rheumatic tricuspid valve disease, unspecified: Secondary | ICD-10-CM | POA: Insufficient documentation

## 2013-06-04 DIAGNOSIS — I071 Rheumatic tricuspid insufficiency: Secondary | ICD-10-CM

## 2013-06-04 HISTORY — PX: TEE WITHOUT CARDIOVERSION: SHX5443

## 2013-06-04 SURGERY — ECHOCARDIOGRAM, TRANSESOPHAGEAL
Anesthesia: Moderate Sedation

## 2013-06-04 MED ORDER — SODIUM CHLORIDE 0.9 % IV SOLN
INTRAVENOUS | Status: DC
  Administered 2013-06-04: 500 mL via INTRAVENOUS

## 2013-06-04 MED ORDER — MIDAZOLAM HCL 10 MG/2ML IJ SOLN
INTRAMUSCULAR | Status: DC | PRN
  Administered 2013-06-04 (×5): 2 mg via INTRAVENOUS

## 2013-06-04 MED ORDER — MIDAZOLAM HCL 5 MG/ML IJ SOLN
INTRAMUSCULAR | Status: AC
  Filled 2013-06-04: qty 3

## 2013-06-04 MED ORDER — FENTANYL CITRATE 0.05 MG/ML IJ SOLN
INTRAMUSCULAR | Status: DC | PRN
  Administered 2013-06-04 (×3): 25 ug via INTRAVENOUS

## 2013-06-04 MED ORDER — FENTANYL CITRATE 0.05 MG/ML IJ SOLN
INTRAMUSCULAR | Status: AC
  Filled 2013-06-04: qty 4

## 2013-06-04 MED ORDER — LIDOCAINE VISCOUS 2 % MT SOLN
OROMUCOSAL | Status: AC
  Filled 2013-06-04: qty 15

## 2013-06-04 NOTE — Progress Notes (Signed)
  Echocardiogram Echocardiogram Transesophageal has been performed.  [name] 06/04/2013, 3:41 PM

## 2013-06-04 NOTE — CV Procedure (Addendum)
    TRANSESOPHAGEAL ECHOCARDIOGRAM   NAME:  Jane Gutierrez   MRN: [id number] DOB:  January 26, 1956   ADMIT DATE: 06/04/2013  INDICATIONS:  Tricuspid regurgitation abnormal RV on TTE    PROCEDURE:   Informed consent was obtained prior to the procedure. The risks, benefits and alternatives for the procedure were discussed and the patient comprehended these risks.  Risks include, but are not limited to, cough, sore throat, vomiting, nausea, somnolence, esophageal and stomach trauma or perforation, bleeding, low blood pressure, aspiration, pneumonia, infection, trauma to the teeth and death.    After a procedural time-out, the patient was given 10 mg versed and 75 mcg fentanyl for moderate sedation.  The oropharynx was anesthetized 10 cc of topical 1% viscous lidocaine and cetacaine spray.  The transesophageal probe was inserted in the esophagus and stomach without difficulty and multiple views were obtained.    COMPLICATIONS:    There were no immediate complications.  FINDINGS:  LEFT VENTRICLE: EF = 55-60%. No regional wall motion abnormalities.  RIGHT VENTRICLE: Normal size and function.   LEFT ATRIUM: Moderate to severely dilated  LEFT ATRIAL APPENDAGE: No thrombus.   RIGHT ATRIUM: Moderately to severely dilated  AORTIC VALVE:  Trileaflet. Normal. No AS/AI  MITRAL VALVE:    Normal. Mild MR  TRICUSPID VALVE: Normal. Dilated annulus. ? Slightly dimunitive septal leaflet. Moderate TR directed toward septum  PULMONIC VALVE: Grossly normal. Trivial PR  INTERATRIAL SEPTUM: No PFO or ASD. Bubble negative.  PERICARDIUM: No effusion  DESCENDING AORTA:Mild plaque.  Haiven Nardone,MD 3:22 PM

## 2013-06-04 NOTE — Interval H&P Note (Signed)
History and Physical Interval Note:  06/04/2013 2:56 PM  Jane Gutierrez  has presented today for surgery, with the diagnosis of RV dysfunction   The various methods of treatment have been discussed with the patient and family. After consideration of risks, benefits and other options for treatment, the patient has consented to  Procedure(s): TRANSESOPHAGEAL ECHOCARDIOGRAM (TEE) (N/A) as a surgical intervention .  The patient's history has been reviewed, patient examined, no change in status, stable for surgery.  I have reviewed the patient's chart and labs.  Questions were answered to the patient's satisfaction.     Daniel Bensimhon

## 2013-06-04 NOTE — H&P (View-Only) (Signed)
Patient ID: Jane Gutierrez, female   DOB: December 20, 1956, 57 y.o.   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 ER +100% PR +86% HER-2/neu (Sentinel nodes were negative for metastatic disease).  She is s/p bilateral mastectomies with reconstruction.    Plan for adjuvant chemotherapy and Herceptin.  On 02/04/13 she started Taxotere/carboplatinum to be given every 3 weeks for a total of 6 cycles. She will also receive Herceptin weekly for the duration of chemotherapy and then every 3 weeks to finish out one year.   Admitted to [facility] 04/13/13 with sepsis due strep throat and chest wound infection. Wound was revised in May. Had drain in x 3 weeks. Yesterday had some drainage from wound and WBC up. Started on vanc. Going to see Dr. Kelly Splinter today.  She returns for routine follow up today.  She will complete her 6th cycle of taxotere/carbo next week.  Denies SOB/PND/Orthopnea. Started exercising again. She is working 4-6 hours per day. Thyroid levels have been high. Synthroid cut back to 100 qod.  Echos: 01/26/13 EF 60% lateral S' 10.9 Grade II diastolic dysfunction 03/25/13 EF 60-65% lat s' 12.8  RV normal 05/27/13 EF lat S' 12.5 RV mildly dilated. Normal function. +RAE. IVC dilated 2.8 cm. Mod TR with mild prolapse of TV. RVSP 35-40.   Has severe iodine reaction    Review of Systems: All pertinent positives and negatives as in HPI, otherwise negative.   Past Medical History  Diagnosis Date  . IBS (irritable bowel syndrome)   . ADD (attention deficit disorder)   . Lyme disease   . Contact lens/glasses fitting     wears contacts or glasses  . Breast cancer 12/04/12    left, ER/PR +  . Allergy   . Asthma   . Thyroid disease   . Hypothyroidism     Current Outpatient Prescriptions  Medication Sig Dispense Refill  . albuterol (PROVENTIL HFA;VENTOLIN  HFA) 108 (90 BASE) MCG/ACT inhaler Inhale 2 puffs into the lungs every 6 (six) hours as needed for shortness of breath.       . B Complex-C (B-COMPLEX WITH VITAMIN C) tablet Take 1 tablet by mouth daily.      [name] dexamethasone (DECADRON) 4 MG tablet TAKE TWO TABLETS [8MG ] BY MOUTH TWICE DAILY THE DAY BEFORE TAXOTERE.THEN TAKE TWO TABLETS [8MG ] TWICE DAILY STARTING THE DAY AFTER CHEMO FOR THREE DAYS.  30 tablet  0  . dextroamphetamine (DEXTROSTAT) 5 MG tablet Take 2.5 mg by mouth 3 (three) times daily.       [name] EPINEPHrine (EPIPEN) 0.3 mg/0.3 mL DEVI Inject 0.3 mg into the muscle as needed (for allergic reaction).       [name] levothyroxine (SYNTHROID, LEVOTHROID) 100 MCG tablet Take 100 mcg by mouth daily.      [name] lidocaine-prilocaine (EMLA) cream Apply 1 application topically as needed (for port-a-cath access).      [name] loratadine (CLARITIN) 10 MG tablet Take 10 mg by mouth daily as needed (for 5 days after Neulasta injection).      . LORazepam (ATIVAN) 1 MG tablet Take 1 tablet (1 mg total) by mouth every 8 (eight) hours as needed for anxiety.  60 tablet  0  . Multiple Vitamin (MULTIVITAMIN WITH MINERALS) TABS Take 1 tablet by mouth daily.      . ondansetron (ZOFRAN ODT) 8 MG disintegrating  tablet Take 1 tablet (8 mg total) by mouth every 8 (eight) hours as needed for nausea.  20 tablet  5  . PRESCRIPTION MEDICATION Inject into the vein as directed. Herceptin infusion weekly on Thursdays; Taxotere and Paraplatin q3weeks with Neulasta support.      . prochlorperazine (COMPAZINE) 10 MG tablet Take 1 tablet (10 mg total) by mouth every 6 (six) hours as needed (Nausea or vomiting).  30 tablet  1  . prochlorperazine (COMPAZINE) 25 MG suppository Place 1 suppository (25 mg total) rectally every 12 (twelve) hours as needed for nausea.  12 suppository  0  . promethazine (PHENERGAN) 25 MG tablet Take 1/2 tablet to 1 tablet 12.5mg -25mg  every 6 hours as needed for nausea.  30 tablet  1  . sennosides-docusate sodium  (SENOKOT-S) 8.6-50 MG tablet Take 1 tablet by mouth daily as needed for constipation.       . vitamin C (ASCORBIC ACID) 500 MG tablet Take 500 mg by mouth daily.      . bacitracin 500 UNIT/GM ointment Apply 1 application topically 2 (two) times daily. Applied to wound on breast.      . fluconazole (DIFLUCAN) 200 MG tablet Take 1 tablet (200 mg total) by mouth daily.  5 tablet  3   No current facility-administered medications for this encounter.   Facility-Administered Medications Ordered in Other Encounters  Medication Dose Route Frequency Provider Last Rate Last Dose  . sodium chloride 0.9 % injection 10 mL  10 mL Intracatheter PRN Victorino December, MD   10 mL at 05/27/13 1227     Allergies  Allergen Reactions  . Betadine (Povidone Iodine)   . Dilaudid (Hydromorphone Hcl) Nausea And Vomiting  . Iodine     REACTION: anaphylactic  . Penicillins   . Shellfish-Derived Products   . Sulfamethoxazole W-Trimethoprim   . Cleocin (Clindamycin Hcl) Rash     PHYSICAL EXAM: Filed Vitals:   05/27/13 1359  BP: 115/70  Pulse: 78   General:  Well appearing. No respiratory difficulty HEENT: normal Neck: supple.  JVP doesn't appear to be elevated. Carotids 2+ bilat; no bruits. No lymphadenopathy or thryomegaly appreciated. Cor: PMI nondisplaced. Regular rate & rhythm. No rubs, gallops 2/6 TR. No RV lift Lungs: clear Abdomen: soft, nontender, nondistended. No hepatosplenomegaly. No bruits or masses. Good bowel sounds. Extremities: no cyanosis, clubbing, rash, tr edema. No cords Neuro: alert & oriented x 3, cranial nerves grossly intact. moves all 4 extremities w/o difficulty. Affect pleasant.   ASSESSMENT & PLAN:

## 2013-06-10 ENCOUNTER — Ambulatory Visit (HOSPITAL_BASED_OUTPATIENT_CLINIC_OR_DEPARTMENT_OTHER): Payer: 59

## 2013-06-10 ENCOUNTER — Other Ambulatory Visit (HOSPITAL_BASED_OUTPATIENT_CLINIC_OR_DEPARTMENT_OTHER): Payer: 59 | Admitting: Lab

## 2013-06-10 ENCOUNTER — Telehealth: Payer: Self-pay | Admitting: Oncology

## 2013-06-10 ENCOUNTER — Telehealth: Payer: Self-pay | Admitting: *Deleted

## 2013-06-10 ENCOUNTER — Encounter: Payer: Self-pay | Admitting: Oncology

## 2013-06-10 ENCOUNTER — Ambulatory Visit (HOSPITAL_BASED_OUTPATIENT_CLINIC_OR_DEPARTMENT_OTHER): Payer: 59 | Admitting: Oncology

## 2013-06-10 VITALS — BP 122/82 | HR 76 | Temp 97.6°F | Resp 20 | Ht 65.0 in | Wt 157.1 lb

## 2013-06-10 DIAGNOSIS — L539 Erythematous condition, unspecified: Secondary | ICD-10-CM

## 2013-06-10 DIAGNOSIS — C50419 Malignant neoplasm of upper-outer quadrant of unspecified female breast: Secondary | ICD-10-CM

## 2013-06-10 DIAGNOSIS — D0512 Intraductal carcinoma in situ of left breast: Secondary | ICD-10-CM

## 2013-06-10 DIAGNOSIS — Z170 Estrogen receptor positive status [ER+]: Secondary | ICD-10-CM

## 2013-06-10 DIAGNOSIS — Z5112 Encounter for antineoplastic immunotherapy: Secondary | ICD-10-CM

## 2013-06-10 LAB — CBC WITH DIFFERENTIAL/PLATELET
BASO%: 0.2 % (ref 0.0–2.0)
EOS%: 2.3 % (ref 0.0–7.0)
Eosinophils Absolute: 0.2 10*3/uL (ref 0.0–0.5)
LYMPH%: 32.3 % (ref 14.0–49.7)
MCHC: 33.2 g/dL (ref 31.5–36.0)
MCV: 87.9 fL (ref 79.5–101.0)
MONO%: 8.5 % (ref 0.0–14.0)
NEUT#: 4.9 10*3/uL (ref 1.5–6.5)
Platelets: 235 10*3/uL (ref 145–400)
RBC: 3.8 10*6/uL (ref 3.70–5.45)
RDW: 15.5 % — ABNORMAL HIGH (ref 11.2–14.5)
nRBC: 0 % (ref 0–0)

## 2013-06-10 LAB — COMPREHENSIVE METABOLIC PANEL (CC13)
ALT: 19 U/L (ref 0–55)
AST: 19 U/L (ref 5–34)
Alkaline Phosphatase: 45 U/L (ref 40–150)
CO2: 26 mEq/L (ref 22–29)
Creatinine: 0.6 mg/dL (ref 0.6–1.1)
Sodium: 138 mEq/L (ref 136–145)
Total Bilirubin: 0.34 mg/dL (ref 0.20–1.20)
Total Protein: 6.8 g/dL (ref 6.4–8.3)

## 2013-06-10 MED ORDER — TRASTUZUMAB CHEMO INJECTION 440 MG
2.0000 mg/kg | Freq: Once | INTRAVENOUS | Status: AC
  Administered 2013-06-10: 147 mg via INTRAVENOUS
  Filled 2013-06-10: qty 7

## 2013-06-10 MED ORDER — HEPARIN SOD (PORK) LOCK FLUSH 100 UNIT/ML IV SOLN
500.0000 [IU] | Freq: Once | INTRAVENOUS | Status: AC | PRN
  Administered 2013-06-10: 500 [IU]
  Filled 2013-06-10: qty 5

## 2013-06-10 MED ORDER — SODIUM CHLORIDE 0.9 % IV SOLN
Freq: Once | INTRAVENOUS | Status: AC
  Administered 2013-06-10: 15:00:00 via INTRAVENOUS

## 2013-06-10 MED ORDER — SODIUM CHLORIDE 0.9 % IJ SOLN
10.0000 mL | INTRAMUSCULAR | Status: DC | PRN
  Administered 2013-06-10: 10 mL
  Filled 2013-06-10: qty 10

## 2013-06-10 MED ORDER — ACETAMINOPHEN 325 MG PO TABS
650.0000 mg | ORAL_TABLET | Freq: Once | ORAL | Status: AC
  Administered 2013-06-10: 650 mg via ORAL

## 2013-06-10 NOTE — Patient Instructions (Signed)
[facility] Discharge Instructions for Patients Receiving Chemotherapy  Today you received the following chemotherapy agents:  heerceptin To help prevent nausea and vomiting after your treatment, we encourage you to take your nausea medication If you develop nausea and vomiting that is not controlled by your nausea medication, call the clinic.   BELOW ARE SYMPTOMS THAT SHOULD BE REPORTED IMMEDIATELY:  *FEVER GREATER THAN 100.5 F  *CHILLS WITH OR WITHOUT FEVER  NAUSEA AND VOMITING THAT IS NOT CONTROLLED WITH YOUR NAUSEA MEDICATION  *UNUSUAL SHORTNESS OF BREATH  *UNUSUAL BRUISING OR BLEEDING  TENDERNESS IN MOUTH AND THROAT WITH OR WITHOUT PRESENCE OF ULCERS  *URINARY PROBLEMS  *BOWEL PROBLEMS  UNUSUAL RASH Items with * indicate a potential emergency and should be followed up as soon as possible.  Feel free to call the clinic you have any questions or concerns. The clinic phone number is [phone].

## 2013-06-10 NOTE — Telephone Encounter (Signed)
Per staff message and POF I have scheduled appts.  JMW  

## 2013-06-10 NOTE — Patient Instructions (Addendum)
Proceed with herceptin today  We will see you back in 3 weeks for every 3 herceptin  Please call with any problems

## 2013-06-11 ENCOUNTER — Other Ambulatory Visit: Payer: Self-pay | Admitting: Certified Registered Nurse Anesthetist

## 2013-06-15 ENCOUNTER — Encounter (HOSPITAL_COMMUNITY): Payer: Self-pay | Admitting: Respiratory Therapy

## 2013-06-15 ENCOUNTER — Encounter (HOSPITAL_BASED_OUTPATIENT_CLINIC_OR_DEPARTMENT_OTHER): Payer: Self-pay | Admitting: *Deleted

## 2013-06-15 NOTE — Progress Notes (Signed)
To [facility] at 1015- Hg on arrival -Npo after Mn -will take levothyroxine,femara with small amt water that am.

## 2013-06-16 ENCOUNTER — Encounter (HOSPITAL_BASED_OUTPATIENT_CLINIC_OR_DEPARTMENT_OTHER): Payer: Self-pay | Admitting: Plastic Surgery

## 2013-06-16 ENCOUNTER — Other Ambulatory Visit: Payer: Self-pay | Admitting: Plastic Surgery

## 2013-06-16 ENCOUNTER — Encounter (HOSPITAL_COMMUNITY): Admission: RE | Payer: Self-pay | Source: Ambulatory Visit

## 2013-06-16 ENCOUNTER — Ambulatory Visit (HOSPITAL_BASED_OUTPATIENT_CLINIC_OR_DEPARTMENT_OTHER)
Admission: RE | Admit: 2013-06-16 | Discharge: 2013-06-16 | Disposition: A | Discharge status: Home / Self Care | Payer: 59 | Source: Ambulatory Visit | Attending: Plastic Surgery | Admitting: Plastic Surgery

## 2013-06-16 ENCOUNTER — Encounter (HOSPITAL_BASED_OUTPATIENT_CLINIC_OR_DEPARTMENT_OTHER)
Admission: RE | Disposition: A | Discharge status: Home / Self Care | Payer: Self-pay | Source: Ambulatory Visit | Attending: Plastic Surgery

## 2013-06-16 ENCOUNTER — Ambulatory Visit (HOSPITAL_COMMUNITY): Admission: RE | Admit: 2013-06-16 | Payer: 59 | Source: Ambulatory Visit | Admitting: Plastic Surgery

## 2013-06-16 ENCOUNTER — Encounter (HOSPITAL_BASED_OUTPATIENT_CLINIC_OR_DEPARTMENT_OTHER): Payer: Self-pay | Admitting: Anesthesiology

## 2013-06-16 ENCOUNTER — Ambulatory Visit (HOSPITAL_BASED_OUTPATIENT_CLINIC_OR_DEPARTMENT_OTHER): Payer: 59 | Admitting: Anesthesiology

## 2013-06-16 DIAGNOSIS — T8579XS Infection and inflammatory reaction due to other internal prosthetic devices, implants and grafts, sequela: Secondary | ICD-10-CM

## 2013-06-16 DIAGNOSIS — Z9010 Acquired absence of unspecified breast and nipple: Secondary | ICD-10-CM | POA: Insufficient documentation

## 2013-06-16 DIAGNOSIS — T8579XA Infection and inflammatory reaction due to other internal prosthetic devices, implants and grafts, initial encounter: Secondary | ICD-10-CM | POA: Insufficient documentation

## 2013-06-16 DIAGNOSIS — Y831 Surgical operation with implant of artificial internal device as the cause of abnormal reaction of the patient, or of later complication, without mention of misadventure at the time of the procedure: Secondary | ICD-10-CM | POA: Insufficient documentation

## 2013-06-16 DIAGNOSIS — Z853 Personal history of malignant neoplasm of breast: Secondary | ICD-10-CM | POA: Insufficient documentation

## 2013-06-16 DIAGNOSIS — Z9221 Personal history of antineoplastic chemotherapy: Secondary | ICD-10-CM | POA: Insufficient documentation

## 2013-06-16 HISTORY — PX: TISSUE EXPANDER PLACEMENT: SHX2530

## 2013-06-16 SURGERY — INSERTION, TISSUE EXPANDER
Anesthesia: General | Site: Breast | Laterality: Left | Wound class: Clean

## 2013-06-16 SURGERY — INSERTION, TISSUE EXPANDER
Anesthesia: Choice | Site: Breast | Laterality: Left

## 2013-06-16 MED ORDER — LACTATED RINGERS IV SOLN
INTRAVENOUS | Status: DC
  Administered 2013-06-16: 100 mL/h via INTRAVENOUS
  Filled 2013-06-16: qty 1000

## 2013-06-16 MED ORDER — SODIUM CHLORIDE 0.9 % IR SOLN
Status: DC | PRN
  Administered 2013-06-16: 12:00:00

## 2013-06-16 MED ORDER — FENTANYL CITRATE 0.05 MG/ML IJ SOLN
INTRAMUSCULAR | Status: DC | PRN
  Administered 2013-06-16: 50 ug via INTRAVENOUS
  Administered 2013-06-16: 25 ug via INTRAVENOUS

## 2013-06-16 MED ORDER — PROMETHAZINE HCL 25 MG/ML IJ SOLN
6.2500 mg | INTRAMUSCULAR | Status: DC | PRN
  Filled 2013-06-16: qty 1

## 2013-06-16 MED ORDER — DEXAMETHASONE SODIUM PHOSPHATE 4 MG/ML IJ SOLN
INTRAMUSCULAR | Status: DC | PRN
  Administered 2013-06-16: 8 mg via INTRAVENOUS

## 2013-06-16 MED ORDER — LIDOCAINE HCL (CARDIAC) 20 MG/ML IV SOLN
INTRAVENOUS | Status: DC | PRN
  Administered 2013-06-16: 50 mg via INTRAVENOUS

## 2013-06-16 MED ORDER — FENTANYL CITRATE 0.05 MG/ML IJ SOLN
25.0000 ug | INTRAMUSCULAR | Status: DC | PRN
  Filled 2013-06-16: qty 1

## 2013-06-16 MED ORDER — MIDAZOLAM HCL 5 MG/5ML IJ SOLN
INTRAMUSCULAR | Status: DC | PRN
  Administered 2013-06-16 (×2): 1 mg via INTRAVENOUS

## 2013-06-16 MED ORDER — ONDANSETRON HCL 4 MG/2ML IJ SOLN
INTRAMUSCULAR | Status: DC | PRN
  Administered 2013-06-16: 4 mg via INTRAVENOUS

## 2013-06-16 MED ORDER — CIPROFLOXACIN IN D5W 400 MG/200ML IV SOLN
400.0000 mg | INTRAVENOUS | Status: AC
  Administered 2013-06-16: 400 mg via INTRAVENOUS
  Filled 2013-06-16: qty 200

## 2013-06-16 MED ORDER — METOCLOPRAMIDE HCL 5 MG/ML IJ SOLN
INTRAMUSCULAR | Status: DC | PRN
  Administered 2013-06-16: 10 mg via INTRAVENOUS

## 2013-06-16 MED ORDER — BUPIVACAINE-EPINEPHRINE 0.25% -1:200000 IJ SOLN
INTRAMUSCULAR | Status: DC | PRN
  Administered 2013-06-16: 3 mL

## 2013-06-16 MED ORDER — PROPOFOL 10 MG/ML IV BOLUS
INTRAVENOUS | Status: DC | PRN
  Administered 2013-06-16: 150 mg via INTRAVENOUS

## 2013-06-16 SURGICAL SUPPLY — 59 items
BAG DECANTER FOR FLEXI CONT (MISCELLANEOUS) ×2 IMPLANT
BANDAGE GAUZE ELAST BULKY 4 IN (GAUZE/BANDAGES/DRESSINGS) ×4 IMPLANT
BINDER BREAST LRG (GAUZE/BANDAGES/DRESSINGS) IMPLANT
BINDER BREAST MEDIUM (GAUZE/BANDAGES/DRESSINGS) IMPLANT
BINDER BREAST XLRG (GAUZE/BANDAGES/DRESSINGS) IMPLANT
BINDER BREAST XXLRG (GAUZE/BANDAGES/DRESSINGS) IMPLANT
BIOPATCH RED 1 DISK 7.0 (GAUZE/BANDAGES/DRESSINGS) IMPLANT
BLADE HEX COATED 2.75 (ELECTRODE) ×2 IMPLANT
BLADE SURG 10 STRL SS (BLADE) ×4 IMPLANT
BLADE SURG 15 STRL LF DISP TIS (BLADE) ×1 IMPLANT
BLADE SURG 15 STRL SS (BLADE) ×1
CANISTER OMNI JUG 16 LITER (MISCELLANEOUS) IMPLANT
CANISTER SUCTION 1200CC (MISCELLANEOUS) ×2 IMPLANT
CHLORAPREP W/TINT 26ML (MISCELLANEOUS) ×2 IMPLANT
CLOTH BEACON ORANGE TIMEOUT ST (SAFETY) ×2 IMPLANT
CORDS BIPOLAR (ELECTRODE) IMPLANT
COVER MAYO STAND STRL (DRAPES) ×2 IMPLANT
COVER TABLE BACK 60X90 (DRAPES) ×2 IMPLANT
DECANTER SPIKE VIAL GLASS SM (MISCELLANEOUS) IMPLANT
DERMABOND ADVANCED (GAUZE/BANDAGES/DRESSINGS) ×1
DERMABOND ADVANCED .7 DNX12 (GAUZE/BANDAGES/DRESSINGS) ×1 IMPLANT
DRAIN CHANNEL 10F 3/8 F FF (DRAIN) IMPLANT
DRAIN CHANNEL 19F RND (DRAIN) ×2 IMPLANT
DRAPE LAPAROSCOPIC ABDOMINAL (DRAPES) ×2 IMPLANT
DRSG PAD ABDOMINAL 8X10 ST (GAUZE/BANDAGES/DRESSINGS) ×2 IMPLANT
DRSG TEGADERM 2-3/8X2-3/4 SM (GAUZE/BANDAGES/DRESSINGS) IMPLANT
ELECT BLADE 4.0 EZ CLEAN MEGAD (MISCELLANEOUS) ×2
ELECT REM PT RETURN 9FT ADLT (ELECTROSURGICAL) ×2
ELECTRODE BLDE 4.0 EZ CLN MEGD (MISCELLANEOUS) ×1 IMPLANT
ELECTRODE REM PT RTRN 9FT ADLT (ELECTROSURGICAL) ×1 IMPLANT
EVACUATOR SILICONE 100CC (DRAIN) ×2 IMPLANT
GAUZE SPONGE 4X4 12PLY STRL LF (GAUZE/BANDAGES/DRESSINGS) IMPLANT
GLOVE BIO SURGEON STRL SZ 6.5 (GLOVE) ×4 IMPLANT
GLOVE BIOGEL M 6.5 STRL (GLOVE) ×8 IMPLANT
GOWN PREVENTION PLUS XLARGE (GOWN DISPOSABLE) ×4 IMPLANT
GOWN STRL NON-REIN LRG LVL3 (GOWN DISPOSABLE) ×6 IMPLANT
IV NS 500ML (IV SOLUTION)
IV NS 500ML BAXH (IV SOLUTION) IMPLANT
KIT FILL MCGHAN 30CC (MISCELLANEOUS) IMPLANT
NEEDLE HYPO 25X1 1.5 SAFETY (NEEDLE) IMPLANT
PACK BASIN DAY SURGERY FS (CUSTOM PROCEDURE TRAY) ×2 IMPLANT
PENCIL BUTTON HOLSTER BLD 10FT (ELECTRODE) ×2 IMPLANT
PIN SAFETY STERILE (MISCELLANEOUS) IMPLANT
SLEEVE SCD COMPRESS KNEE MED (MISCELLANEOUS) ×2 IMPLANT
SPONGE LAP 18X18 X RAY DECT (DISPOSABLE) ×6 IMPLANT
SUT MON AB 5-0 PS2 18 (SUTURE) ×2 IMPLANT
SUT PDS AB 2-0 CT2 27 (SUTURE) ×2 IMPLANT
SUT PDS AB 3-0 PS2 18 (SUTURE) ×2 IMPLANT
SUT SILK 3 0 PS 1 (SUTURE) ×2 IMPLANT
SUT VIC AB 3-0 SH 27 (SUTURE) ×1
SUT VIC AB 3-0 SH 27X BRD (SUTURE) ×1 IMPLANT
SUT VICRYL 4-0 PS2 18IN ABS (SUTURE) ×2 IMPLANT
SYR 50ML LL SCALE MARK (SYRINGE) IMPLANT
SYR BULB IRRIGATION 50ML (SYRINGE) ×2 IMPLANT
SYR CONTROL 10ML LL (SYRINGE) ×2 IMPLANT
TOWEL OR 17X24 6PK STRL BLUE (TOWEL DISPOSABLE) ×4 IMPLANT
TUBE CONNECTING 12X1/4 (SUCTIONS) ×2 IMPLANT
UNDERPAD 30X30 INCONTINENT (UNDERPADS AND DIAPERS) ×4 IMPLANT
YANKAUER SUCT BULB TIP NO VENT (SUCTIONS) ×2 IMPLANT

## 2013-06-16 NOTE — Transfer of Care (Signed)
Immediate Anesthesia Transfer of Care Note  Patient: Jane Gutierrez  Procedure(s) Performed: Procedure(s) (LRB): LEFT BREAST TISSUE EXPANDER REMOVAL (Left)  Patient Location: PACU  Anesthesia Type: General  Level of Consciousness: awake, oriented, sedated and patient cooperative  Airway & Oxygen Therapy: Patient Spontanous Breathing and Patient connected to face mask oxygen  Post-op Assessment: Report given to PACU RN and Post -op Vital signs reviewed and stable  Post vital signs: Reviewed and stable  Complications: No apparent anesthesia complications

## 2013-06-16 NOTE — H&P (View-Only) (Signed)
This document contains confidential information from a [facility] medical record system and may be unauthenticated. Release may be made only with a valid authorization or in accordance with applicable policies of Medical Center or its affiliates. This document must be maintained in a secure manner or discarded/destroyed as required by Medical Center policy or by a confidential means such as shredding.     Jane Gutierrez  06/15/2013 8:00 AM   Office Visit  MRN:  [id number]  Department: Plastic Surgery  Dept Phone: [phone]  Description: Female DOB: 02/28/1956  Provider: Payson Crumby Sanger, DO    Diagnoses   Acquired absence of bilateral breasts and nipples    -  Primary   V45.71     Reason for Visit   Follow-up   Breast   Vitals - Last Recorded    110/58  90  98.9 F (37.2 C) (Oral)  1.651 m (5' 5")  68.947 kg (152 lb)  25.29 kg/m2     SpO2   96%               Subjective:    Patient ID: Jane Gutierrez is a 56 y.o. female.  HPI The patient is a 56 yrs old wf here for post-operative follow up after breast reconstruction with expanders and ADM placement.    A routine screening mammogram followed by an MRI showed a lesion of the left breast with a biopsy-proven DCIS. The right breast showed an irregular enhancing area. She is 5 feet 5 inches tall and weighs 165 pounds. Her bra was a 36 B-C and she would like to be around a B. She has stopped  Chemotherapy for cardiac reasons. She was admitted ~ a month ago for fever and found to have a strep throat. She had redness in the left breast area with a seroma that we drained.  She was then taken back to the OR and the vertical limb ulcer was excised and she was closed.  She did well until she started chemo again.  The fluid and redness has returned again.  The area was cultured and negative. This looks like lymph seroma.     The following portions of the patient's history were reviewed and updated as appropriate: allergies, current medications,  past family history, past medical history, past social history, past surgical history and problem list.  Review of Systems Objective:    Physical Exam  Constitutional: She is oriented to person, place, and time. She appears well-developed and well-nourished. No distress.  Cardiovascular: Normal rate.   Pulmonary/Chest: Effort normal. No respiratory distress.  Neurological: She is alert and oriented to person, place, and time.  Skin: Skin is warm and dry.  Psychiatric: She has a normal mood and affect. Her behavior is normal. Judgment and thought content normal.      Assessment:   1.  Acquired absence of bilateral breasts and nipples        Plan:     We had a long discussion about options and at this point we agree to go to the OR remove the expander, place a drain, antibiotics and wait to see how the area responds.  She will either need to be re-expanded or a latissimus flap.    Medications Ordered This Encounter    ciprofloxacin (CIPRO) 500 MG tablet 20  Take 1 tablet (500 mg total) by mouth 2 times daily.    fluconazole (DIFLUCAN) 150 MG tablet 3 tablet  Take 1 tablet (150 mg total)   by mouth daily for 3 doses.          

## 2013-06-16 NOTE — Op Note (Signed)
Operative Note  PREOPERATIVE DIAGNOSES:  1. History of breast cancer.  2. Acquired absence of bilateral breast.  3. Infected Left breast  POSTOPERATIVE DIAGNOSES:  1. History of breast cancer.  2. Acquired absence of bilateral breast.  3. Infected left breast  PROCEDURE:  1. Removal of left breast tissue expander.  SURGEON: Wayland Denis, DO  ASSISTANT:  Shawn Rayburn, PA  ANESTHESIA:  General.   COMPLICATIONS: None.   INDICATIONS FOR PROCEDURE:  The patient has a history of bilateral mastectomies and had tissue expanders placed at the time of mastectomies. She now presents for removal of the left breast expander due to infection that has not responded to antibiotic treatment and is likely related to her chemotherapy.   CONSENT:  Informed consent was obtained directly from the patient. Risks, benefits and alternatives were fully discussed. Specific risks including but not limited to bleeding, infection, hematoma, seroma, scarring, pain, implant infection, implant extrusion, capsular contracture, asymmetry, wound healing problems, and need for further surgery were all discussed. The patient did have an ample opportunity to have her questions answered to her satisfaction.   DESCRIPTION OF PROCEDURE:  The patient was taken to the operating room. SCDs were placed and IV antibiotics were given. The patient's chest was prepped and draped in a sterile fashion. A time out was performed and all information confirmed to be correct.  The old mastectomy scar was opened and the expander was removed.  The ADM was not fully incorporated and therefore was removed.  The pocket was irrigated with antibiotic solution.  Hemostasis was ensured and a Jackson-Pratt drain with closed bulb suction was placed in the left breast and secured to the chest wall with a 4-0 silk. The skin was closed with 3-0 Vicryl deep, 4-0 Vicryl and 5-0 Monocryl to close the skin. Dermabond was applied with a  sterile dressing.  A  breast binder and ABD were placed.   The patient was allowed to wake up from anesthesia and taken to the recovery room in satisfactory condition.

## 2013-06-16 NOTE — Anesthesia Procedure Notes (Signed)
Procedure Name: LMA Insertion Date/Time: 06/16/2013 12:00 PM Performed by: Renella Cunas D Pre-anesthesia Checklist: Patient identified, Emergency Drugs available, Suction available and Patient being monitored Patient Re-evaluated:Patient Re-evaluated prior to inductionOxygen Delivery Method: Circle System Utilized Preoxygenation: Pre-oxygenation with 100% oxygen Intubation Type: IV induction Ventilation: Mask ventilation without difficulty LMA: LMA inserted LMA Size: 4.0 Number of attempts: 1 Airway Equipment and Method: bite block Placement Confirmation: positive ETCO2 Tube secured with: Tape Dental Injury: Teeth and Oropharynx as per pre-operative assessment

## 2013-06-16 NOTE — Anesthesia Postprocedure Evaluation (Signed)
  Anesthesia Post-op Note  Patient: Jane Gutierrez  Procedure(s) Performed: Procedure(s) (LRB): LEFT BREAST TISSUE EXPANDER REMOVAL (Left)  Patient Location: PACU  Anesthesia Type: General  Level of Consciousness: awake and alert   Airway and Oxygen Therapy: Patient Spontanous Breathing  Post-op Pain: mild  Post-op Assessment: Post-op Vital signs reviewed, Patient's Cardiovascular Status Stable, Respiratory Function Stable, Patent Airway and No signs of Nausea or vomiting  Last Vitals:  Filed Vitals:   06/16/13 1300  BP: 111/66  Pulse: 68  Temp:   Resp: 16    Post-op Vital Signs: stable   Complications: No apparent anesthesia complications

## 2013-06-16 NOTE — H&P (Signed)
This document contains confidential information from a [facility] medical record system and may be unauthenticated. Release may be made only with a valid authorization or in accordance with applicable policies of Medical Center or its affiliates. This document must be maintained in a secure manner or discarded/destroyed as required by Medical Center policy or by a confidential means such as shredding.     Jane Gutierrez  06/15/2013 8:00 AM   Office Visit  MRN:  [id number]  Department: Plastic Surgery  Dept Phone: [phone]  Description: Female DOB: 1956-02-10  Provider: Wayland Denis, DO    Diagnoses   Acquired absence of bilateral breasts and nipples    -  Primary   V45.71     Reason for Visit   Follow-up   Breast   Vitals - Last Recorded    110/58  90  98.9 F (37.2 C) (Oral)  1.651 m (5\' 5" )  68.947 kg (152 lb)  25.29 kg/m2     SpO2   96%               Subjective:    Patient ID: Jane Gutierrez is a 57 y.o. female.  HPI The patient is a 57 yrs old wf here for post-operative follow up after breast reconstruction with expanders and ADM placement.    A routine screening mammogram followed by an MRI showed a lesion of the left breast with a biopsy-proven DCIS. The right breast showed an irregular enhancing area. She is 5 feet 5 inches tall and weighs 165 pounds. Her bra was a 36 B-C and she would like to be around a B. She has stopped  Chemotherapy for cardiac reasons. She was admitted ~ a month ago for fever and found to have a strep throat. She had redness in the left breast area with a seroma that we drained.  She was then taken back to the OR and the vertical limb ulcer was excised and she was closed.  She did well until she started chemo again.  The fluid and redness has returned again.  The area was cultured and negative. This looks like lymph seroma.     The following portions of the patient's history were reviewed and updated as appropriate: allergies, current medications,  past family history, past medical history, past social history, past surgical history and problem list.  Review of Systems Objective:    Physical Exam  Constitutional: She is oriented to person, place, and time. She appears well-developed and well-nourished. No distress.  Cardiovascular: Normal rate.   Pulmonary/Chest: Effort normal. No respiratory distress.  Neurological: She is alert and oriented to person, place, and time.  Skin: Skin is warm and dry.  Psychiatric: She has a normal mood and affect. Her behavior is normal. Judgment and thought content normal.      Assessment:   1.  Acquired absence of bilateral breasts and nipples        Plan:     We had a long discussion about options and at this point we agree to go to the OR remove the expander, place a drain, antibiotics and wait to see how the area responds.  She will either need to be re-expanded or a latissimus flap.    Medications Ordered This Encounter    ciprofloxacin (CIPRO) 500 MG tablet 20  Take 1 tablet (500 mg total) by mouth 2 times daily.    fluconazole (DIFLUCAN) 150 MG tablet 3 tablet  Take 1 tablet (150 mg total)  by mouth daily for 3 doses.

## 2013-06-16 NOTE — Anesthesia Preprocedure Evaluation (Signed)
Anesthesia Evaluation  Patient identified by MRN, date of birth, ID band Patient awake    Reviewed: Allergy & Precautions, H&P , NPO status , Patient's Chart, lab work & pertinent test results  Airway Mallampati: II TM Distance: >3 FB Neck ROM: Full    Dental no notable dental hx.    Pulmonary asthma ,  breath sounds clear to auscultation  Pulmonary exam normal       Cardiovascular negative cardio ROS  Rhythm:Regular Rate:Normal     Neuro/Psych negative neurological ROS  negative psych ROS   GI/Hepatic negative GI ROS, Neg liver ROS,   Endo/Other  negative endocrine ROS  Renal/GU negative Renal ROS  negative genitourinary   Musculoskeletal negative musculoskeletal ROS (+)   Abdominal   Peds negative pediatric ROS (+)  Hematology negative hematology ROS (+)   Anesthesia Other Findings   Reproductive/Obstetrics negative OB ROS                         Anesthesia Physical Anesthesia Plan  ASA: II  Anesthesia Plan: General   Post-op Pain Management:    Induction: Intravenous  Airway Management Planned: LMA  Additional Equipment:   Intra-op Plan:   Post-operative Plan:   Informed Consent: I have reviewed the patients History and Physical, chart, labs and discussed the procedure including the risks, benefits and alternatives for the proposed anesthesia with the patient or authorized representative who has indicated his/her understanding and acceptance.   Dental advisory given  Plan Discussed with: CRNA and Surgeon  Anesthesia Plan Comments:         Anesthesia Quick Evaluation  

## 2013-06-16 NOTE — Brief Op Note (Signed)
06/16/2013  12:34 PM  PATIENT:  Heloise Purpura  57 y.o. female  PRE-OPERATIVE DIAGNOSIS:  breast tissue expander  POST-OPERATIVE DIAGNOSIS:  breast tissue expander  PROCEDURE:  Procedure(s): LEFT BREAST TISSUE EXPANDER REMOVAL (Left)  SURGEON:  Surgeon(s) and Role:    * Avante Carneiro Sanger, DO - Primary  PHYSICIAN ASSISTANT: Shawn Rayburn, PA  ASSISTANTS: none  ANESTHESIA:   general  EBL:  Total I/O In: 200 [I.V.:200] Out: -   BLOOD ADMINISTERED:none  DRAINS: (1) Jackson-Pratt drain(s) with closed bulb suction in the left breast   LOCAL MEDICATIONS USED:  MARCAINE     SPECIMEN:  No Specimen  DISPOSITION OF SPECIMEN:  N/A  COUNTS:  YES  TOURNIQUET:  * No tourniquets in log *  DICTATION: .Dragon Dictation  PLAN OF CARE: Discharge to home after PACU  PATIENT DISPOSITION:  PACU - hemodynamically stable.   Delay start of Pharmacological VTE agent (>24hrs) due to surgical blood loss or risk of bleeding: no

## 2013-06-16 NOTE — Interval H&P Note (Signed)
History and Physical Interval Note:  06/16/2013 10:32 AM  Jane Gutierrez  has presented today for surgery, with the diagnosis of breast tissue expander  The various methods of treatment have been discussed with the patient and family. After consideration of risks, benefits and other options for treatment, the patient has consented to  Procedure(s): LEFT BREAST TISSUE EXPANDER REMOVAL (Left) as a surgical intervention .  The patient's history has been reviewed, patient examined, no change in status, stable for surgery.  I have reviewed the patient's chart and labs.  Questions were answered to the patient's satisfaction.     SANGER,Izick Gasbarro

## 2013-06-17 ENCOUNTER — Encounter (HOSPITAL_BASED_OUTPATIENT_CLINIC_OR_DEPARTMENT_OTHER): Payer: Self-pay | Admitting: Plastic Surgery

## 2013-06-21 ENCOUNTER — Telehealth: Payer: Self-pay | Admitting: *Deleted

## 2013-06-21 ENCOUNTER — Telehealth: Payer: Self-pay | Admitting: Oncology

## 2013-06-21 NOTE — Telephone Encounter (Signed)
, °

## 2013-06-21 NOTE — Telephone Encounter (Signed)
Per staff phone call I have adjusted appt for 7/10 to 7/11.  JMW

## 2013-06-24 ENCOUNTER — Other Ambulatory Visit: Payer: 59 | Admitting: Lab

## 2013-06-24 ENCOUNTER — Ambulatory Visit: Payer: 59 | Admitting: Adult Health

## 2013-06-24 ENCOUNTER — Ambulatory Visit: Payer: 59

## 2013-06-24 NOTE — Progress Notes (Signed)
[facility]  [phone]    OFFICE PROGRESS NOTE  CC  Jane Hy, MD [address] [place] [address] Dr. Emelia Loron Dr. Chipper Herb Dr. Starleen Arms Dr. Dorothy Puffer  DIAGNOSIS: 57 year old female who presented on 12/24/2012 which new diagnosis of invasive ductal carcinoma, ER positive, PR positive, HER-2/neu positive in the left breast.    PRIOR THERAPY:  #1 patient has multiple medical problems but recently she had a mammogram performed that showed a 5.4 cm area of calcifications. MRI of the breasts showed the area extended to about 7.2 cm. Needle core biopsy performed showed a high-grade DCIS that was ER positive PR positive.  #2 she was seen by Dr. Emelia Loron regarding surgical options. Patient wanted to have bilateral mastectomies. She went on to have this performed on 12/31/2012. The final pathology did reveal a 2.3 cm invasive ductal carcinoma in the left breast. The tumor was ER +100% PR +86% HER-2/neu was amplified with a ratio of 6.81. Ki-67 was elevated at 46%. She has had immediate reconstruction.  #3 patient is seen back in medical oncology for discussion of adjuvant treatment. Since patient is HER-2/neu positive recommendation is her 2-based therapy consisting of Taxotere carboplatinum and Herceptin. The Taxotere or and carboplatinum will be given every 3 weeks for 6 cycles. She will receive Neulasta on day 2. Herceptin will be given weekly with duration of chemotherapy and then every 3 weeks to finish out a one-year of treatment. Patient also understands that she will be placed on antiestrogen therapy with an aromatase inhibitor or tamoxifen.  TCH was started on 02/04/13-05/13/2013  #4 patient will continue Herceptin every 3 weeks to finish out 1 year.  #5 she will also begin antiestrogen therapy with letrozole 2.5 mg daily starting 06/03/2013 total of 5 years of therapy is planned at this time.  CURRENT THERAPY:  Q. 3  weekly Herceptin  INTERVAL HISTORY: Patient is seen in followup today overall she seems to be doing well her breast infection is slowly, being under control. However the site still looks a little erythematous on the left side. He is denying any nausea vomiting fevers chills night sweats headaches. She is fatigued. She is requesting that we not give her cycle 6 of her scheduled chemotherapy consisting of Taxotere and carboplatinum. We will finish out her chemotherapy to 5 cycles only. However she does need to continue to receive Herceptin which she is getting. She denies any nausea vomiting no myalgias and arthralgias no peripheral paresthesias. Remainder of the 10 point review of systems is negative.  MEDICAL HISTORY: Past Medical History  Diagnosis Date  . IBS (irritable bowel syndrome)   . ADD (attention deficit disorder)   . Lyme disease   . Contact lens/glasses fitting     wears contacts or glasses  . Breast cancer 12/04/12    left, ER/PR +  . Allergy   . Asthma   . Thyroid disease   . Hypothyroidism     ALLERGIES:  is allergic to betadine; dilaudid; iodine; penicillins; shellfish-derived products; sulfamethoxazole w-trimethoprim; and cleocin.  MEDICATIONS:  Current Outpatient Prescriptions  Medication Sig Dispense Refill  . albuterol (PROVENTIL HFA;VENTOLIN HFA) 108 (90 BASE) MCG/ACT inhaler Inhale 2 puffs into the lungs every 6 (six) hours as needed for shortness of breath.       . B Complex-C (B-COMPLEX WITH VITAMIN C) tablet Take 1 tablet by mouth daily.      [name] dextroamphetamine (DEXTROSTAT) 5 MG tablet Take 2.5 mg by  mouth 3 (three) times daily.       [name] EPINEPHrine (EPIPEN) 0.3 mg/0.3 mL DEVI Inject 0.3 mg into the muscle as needed (for allergic reaction).       [name] levothyroxine (SYNTHROID, LEVOTHROID) 100 MCG tablet Take 100 mcg by mouth daily.      [name] lidocaine-prilocaine (EMLA) cream Apply 1 application topically as needed (for port-a-cath access).      [name] loratadine  (CLARITIN) 10 MG tablet Take 10 mg by mouth daily as needed (for 5 days after Neulasta injection).      . LORazepam (ATIVAN) 1 MG tablet Take 1 tablet (1 mg total) by mouth every 8 (eight) hours as needed for anxiety.  60 tablet  0  . Multiple Vitamin (MULTIVITAMIN WITH MINERALS) TABS Take 1 tablet by mouth daily.      [name] PRESCRIPTION MEDICATION Inject into the vein as directed. Herceptin infusion weekly on Thursdays; Taxotere and Paraplatin q3weeks with Neulasta support.      . sennosides-docusate sodium (SENOKOT-S) 8.6-50 MG tablet Take 1 tablet by mouth daily.       . vitamin C (ASCORBIC ACID) 500 MG tablet Take 500 mg by mouth daily.      [name] aspirin 81 MG tablet Take 81 mg by mouth daily.      . cephALEXin (KEFLEX) 500 MG capsule Take 1 capsule (500 mg total) by mouth 4 (four) times daily.  40 capsule  3  . ciprofloxacin (CIPRO) 500 MG tablet Take 500 mg by mouth 2 (two) times daily. Take for 10 days. First dose on 06/14/13      . letrozole (FEMARA) 2.5 MG tablet Take 1 tablet (2.5 mg total) by mouth daily.  90 tablet  6   No current facility-administered medications for this visit.    SURGICAL HISTORY:  Past Surgical History  Procedure Laterality Date  . Brain surgery      removal benign frontal lobe cyst in 1987  . Foot surgery  2013    bilateral  . Gynecologic cryosurgery      cervical cancer, 1998 last remained disease free with recent normal pap  . Diagnostic laparoscopy  2004    lysis adh-uterine ablation  . Breast surgery      bilateral mastopexy  . Breast surgery      removal benign left breast lesion  . Abdominal hysterectomy  2005    TAH/BSO-lysis adh, enometriosis  . Face lift    . Axillary sentinel node biopsy  12/31/2012    Procedure: AXILLARY SENTINEL NODE BIOPSY;  Surgeon: Emelia Loron, MD;  Location: [facility];  Service: General;  Laterality: Bilateral;  bilateral sentinel node  . Total mastectomy  12/31/2012    Procedure: TOTAL MASTECTOMY;   Surgeon: Emelia Loron, MD;  Location: [facility];  Service: General;  Laterality: Bilateral;  bilateral total mastectomies  . Breast reconstruction with placement of tissue expander and flex hd (acellular hydrated dermis)  12/31/2012    Procedure: BREAST RECONSTRUCTION WITH PLACEMENT OF TISSUE EXPANDER AND FLEX HD (ACELLULAR HYDRATED DERMIS);  Surgeon: Wayland Denis, DO;  Location: [facility];  Service: Plastics;  Laterality: Bilateral;  bilateral immediate breast reconstruction with expanders and flex hd   . Portacath placement  01/27/2013    Procedure: INSERTION PORT-A-CATH;  Surgeon: Emelia Loron, MD;  Location: [facility];  Service: General;  Laterality: Right;  . Incision and drainage of wound Left 04/28/2013    Procedure: IRRIGATION AND DEBRIDEMENT LEFT BREAST WOUND, POSSIBLE  CLOSURE OF WOUND WITH DRAIN PLACEMENT;  Surgeon: Wayland Denis, DO;  Location: [facility];  Service: Plastics;  Laterality: Left;  [name] Mastectomy  1/14  . Tee without cardioversion N/A 06/04/2013    Procedure: TRANSESOPHAGEAL ECHOCARDIOGRAM (TEE);  Surgeon: Dolores Patty, MD;  Location: [facility];  Service: Cardiovascular;  Laterality: N/A;  . Tissue expander placement Left 06/16/2013    Procedure: LEFT BREAST TISSUE EXPANDER REMOVAL;  Surgeon: Wayland Denis, DO;  Location: [facility];  Service: Plastics;  Laterality: Left;    REVIEW OF SYSTEMS:  General: fatigue (+), night sweats (-), fever (+), pain (-) Lymph: palpable nodes (-) HEENT: vision changes (-), mucositis (-), gum bleeding (-), epistaxis (-) Cardiovascular: chest pain (-), palpitations (-) Pulmonary: shortness of breath (-), dyspnea on exertion (-), cough (-), hemoptysis (-) GI:  Early satiety (-), melena (-), dysphagia (-), nausea/vomiting (-), diarrhea (-) GU: dysuria (-), hematuria (-), incontinence (-) Musculoskeletal: joint swelling (-), joint pain (-), back pain  (-) Neuro: weakness (-), numbness (-), headache (-), confusion (-) Skin: Rash (-), lesions (-), dryness (-) Psych: depression (-), suicidal/homicidal ideation (-), feeling of hopelessness (-)  PHYSICAL EXAMINATION:  Blood pressure 110/63, pulse 90, temperature 98.2 F (36.8 C), temperature source Oral, resp. rate 20, height 5\' 5"  (1.651 m), weight 157 lb 14.4 oz (71.623 kg). Body mass index is 26.28 kg/(m^2). General: Patient is a well appearing female in no acute distress HEENT: PERRLA, sclerae anicteric no conjunctival pallor, MMM Neck: supple, no palpable adenopathy Lungs: clear to auscultation bilaterally, no wheezes, rhonchi, or rales. Right port negative for discharge, tenderness or erythema. Cardiovascular: regular rate rhythm, S1, S2, no murmurs, rubs or gallops Abdomen: Soft, non-tender, non-distended, normoactive bowel sounds, no HSM Extremities: warm and well perfused, no clubbing, cyanosis, or edema Skin: No rashes or lesions Neuro: Non-focal Breasts: She status post bilateral mastectomies with expander is in place. The cosmetic result is symmetrical.  Both axillae are benign. Her left breast is healing well, it is erythematous on the left lower outer quadrant and possibly fluctuant.  Slightly warm, and non-tender.   ECOG PERFORMANCE STATUS: 1 - Symptomatic but completely ambulatory  LABORATORY DATA: Lab Results  Component Value Date   WBC 8.6 06/10/2013   HGB 11.3* 06/16/2013   HCT 33.4* 06/10/2013   MCV 87.9 06/10/2013   PLT 235 06/10/2013      Chemistry      Component Value Date/Time   NA 138 06/10/2013 1312   NA 137 04/14/2013 0430   K 3.7 06/10/2013 1312   K 3.9 04/14/2013 0430   CL 104 06/10/2013 1312   CL 103 04/14/2013 0430   CO2 26 06/10/2013 1312   CO2 27 04/14/2013 0430   BUN 18.7 06/10/2013 1312   BUN 13 04/14/2013 0430   CREATININE 0.6 06/10/2013 1312   CREATININE 0.55 04/14/2013 0430      Component Value Date/Time   CALCIUM 9.1 06/10/2013 1312   CALCIUM 8.2*  04/14/2013 0430   ALKPHOS 45 06/10/2013 1312   ALKPHOS 50 04/13/2013 2030   AST 19 06/10/2013 1312   AST 26 04/13/2013 2030   ALT 19 06/10/2013 1312   ALT 36* 04/13/2013 2030   BILITOT 0.34 06/10/2013 1312   BILITOT 0.5 04/13/2013 2030     12/31/2012 ADDITIONAL INFORMATION: 1. CHROMOGENIC IN-SITU HYBRIDIZATION Interpretation: HER2/NEU BY CISH - SHOWS AMPLIFICATION BY CISH ANALYSIS. THE RATIO OF HER2: CEP 17 SIGNALS WAS 6.81 Reference range: Ratio: HER2:CEP17 < 1.8 gene amplification not observed  Ratio: HER2:CEP 17 1.8-2.2 - equivocal result Ratio: HER2:CEP17 > 2.2 - gene amplification observed Pecola Leisure MD Pathologist, Electronic Signature ( Signed 01/11/2013) 1. PROGNOSTIC INDICATORS - ACIS Results IMMUNOHISTOCHEMICAL AND MORPHOMETRIC ANALYSIS BY THE AUTOMATED CELLULAR IMAGING SYSTEM (ACIS) Estrogen Receptor (Negative, <1%): 99%, STRONG STAINING INTENSITY Progesterone Receptor (Negative, <1%): 10%, STRONG STAINING INTENSITY Proliferation Marker Ki67 by M IB-1 (Low<20%): 46% All controls stained appropriately Pecola Leisure MD Pathologist, Electronic Signature ( Signed 01/06/2013) 1 of 4 FINAL for HULL, Giannamarie G [id number]) FINAL DIAGNOSIS Diagnosis 1. Breast, simple mastectomy, Left - INVASIVE DUCTAL CARCINOMA, GRADE II (2.3 CM), SEE COMMENT. - LYMPHOVASCULAR INVASION IDENTIFIED - INVASIVE TUMOR IS 3 CM FROM THE NEAREST MARGIN (DEEP). - HIGH GRADE DUCTAL CARCINOMA IN SITU WITH COMEDO NECROSIS AND CALCIFICATIONS. - SEE TUMOR SYNOPTIC TEMPLATE BELOW. 2. Lymph node, sentinel, biopsy, Left axillary - ONE LYMPH NODE, NEGATIVE FOR TUMOR (0/1). 3. Breast, simple mastectomy, Right - BENIGN BREAST TISSUE, SEE COMMENT. - NEGATIVE FOR ATYPIA OR MALIGNANCY. 4. Lymph node, sentinel, biopsy, Right axillary - ONE LYMPH NODE, NEGATIVE FOR TUMOR (0/1). Microscopic Comment 1. BREAST, INVASIVE TUMOR, WITH LYMPH NODE SAMPLING Specimen, including laterality: Left breast Procedure: Simple  mastectomy Grade: II of III Tubule formation: 3 Nuclear pleomorphism: 2 Mitotic: 1 Tumor size (gross measurement) 2.3 cm Margins: Invasive, distance to closest margin: 3.0 cm In-situ, distance to closest margin: 3.0 cm (deep) If margin positive, focally or broadly: N/A Lymphovascular invasion: Present, extensive Ductal carcinoma in situ: Present Grade: III of III Extensive intraductal component: Absent Lobular neoplasia: Absent Tumor focality: Unifocal Treatment effect: None If present, treatment effect in breast tissue, lymph nodes or both: N/A Extent of tumor: Skin: Grossly negative for tumor Nipple: Grossly negative for tumor Skeletal muscle: Microscopically negative for tumor Lymph nodes: # examined: 1 Lymph nodes with metastasis: 0 Breast prognostic profile: Estrogen receptor: Repeated, previous study shows 100% positivity. [phone]) Progesterone receptor: Repeated, previous study demonstrated 86% positivity ([id number]) Her 2 neu: Pending and will be reported in an addendum. Ki-67: Pending and will be reported in an addendum. Non-neoplastic breast: No significant findings. TNM: pT2, pN0, pMX Comments: None. (CR:kh 01-04-13) 3. The entire 2.0 cm previous biopsy hematoma is submitted for histopathologic review. On review, there is 2 of 4 FINAL for HULL, Cai G [id number]) Microscopic Comment(continued) no in situ or invasive carcinoma identified in any of the slide sections examined. Additional non-neoplastic findings include benign fibrocystic change, pseudoangiomatous stromal hyperplasia, fibroadenomatoid nodules, periductal chronic inflammation with associated stromal fibrosis and microcalcifications in benign ducts and lobules. Pathologic Stage is pTX pN0, PMX. The surgical resection margin(s) of the specimen were inked and microscopically evaluated. [place] [facility] RADIOGRAPHIC STUDIES:  Mr Biopsy/wire Localization  12/25/2012  *RADIOLOGY REPORT*  Clinical Data:  6  mm enhancing mass in the upper right breast.  The patient has recent diagnosis of left breast DCIS.  MRI GUIDED VACUUM ASSISTED BIOPSY OF THE RIGHT BREAST WITHOUT AND WITH CONTRAST  Comparison: Previous exams.  Technique: Multiplanar, multisequence MR images of the right breast were obtained prior to and following the intravenous administration of 15 ml of Mulithance.  I met with the patient, and we discussed the procedure of MRI guided biopsy, including risks, benefits, and alternatives. Specifically, we discussed the risks of infection, bleeding, tissue injury, clip migration, and inadequate sampling.  Informed, written consent was given.  Using sterile technique, 2% Lidocaine, MRI guidance, and a 9 gauge vacuum assisted device, biopsy was performed of a 6 mm enhancing mass using a lateral to  medial approach.  At the conclusion of the procedure, a tissue marker clip was deployed into the biopsy cavity.  IMPRESSION: MRI guided biopsy of the right breast. No apparent complications.  THREE-DIMENSIONAL MR IMAGE RENDERING ON INDEPENDENT WORKSTATION:  Three-dimensional MR images were rendered by post-processing of the original MR data on an independent workstation.  The three- dimensional MR images were interpreted, and findings were reported in the accompanying complete MRI report for this study.   Original Report Authenticated By: Britta Mccreedy, M.D.    Nm Sentinel Node Inj-no Rpt (breast)  12/31/2012  CLINICAL DATA: right axillary sentinel node biopsy   Sulfur colloid was injected intradermally by the nuclear medicine  technologist for breast cancer sentinel node localization.     Nm Sentinel Node Inj-no Rpt (breast)  12/31/2012  CLINICAL DATA: left axillary sentinel node biopsy   Sulfur colloid was injected intradermally by the nuclear medicine  technologist for breast cancer sentinel node localization.     Mm Digital Diagnostic Unilat R  12/25/2012  *RADIOLOGY REPORT*  Clinical Data:  MRI guided core needle  biopsy of a 6 mm nodule of enhancement in the upper right breast was performed.  DIGITAL DIAGNOSTIC RIGHT MAMMOGRAM  Comparison:  Previous exams.  Findings:  Films are performed following MRI guided biopsy of 6 mm enhancing nodule in the upper central right breast.  A dumbbell shaped biopsy clip is present in the upper right breast just medial to the level of the nipple.  The clip is within a biopsy cavity that contains air and a small air-fluid level.  IMPRESSION: Satisfactory position of biopsy clip in the upper right breast.   Original Report Authenticated By: Britta Mccreedy, M.D.     ASSESSMENT: 57 year old [place] woman  (1) status post bilateral mastectomies in 12/31/2012 showing  (a) on the right, no evidence of malignancy  (b) on the left, a pT2 pN0, stage IIA invasive ductal carcinoma, grade 2, estrogen receptor 100% positive, progesterone receptor 86% positive, with an MIB-1 of 46%, and HER-2 amplification with a CISH ratio of 6.81  (2) starting adjuvant chemotherapy with carboplatin, docetaxel and trastuzumab February13 2014, to consist of 6 cycles. However patient could not tolerate all of her chemotherapy so therefore she requested that we discontinued the last cycle of her treatment which would have been cycle #6. Therefore she has only received 5 of 6 cycles. However patient will continue Herceptin every 3 weeks.  #3 she will also proceed with receiving adjuvant antiestrogen therapy since her tumor was ER positive. We discussed beginning her on letrozole 2.5 mg daily. She understands the risks and benefits and literature was given to her.  #4 patient also does have ongoing issues with her left breast with possible infection I did give her Keflex refill.    PLAN:   #1 Proceed with Herceptin.   #2 patient will return in 3 weeks' time for Herceptin again.   All questions were answered. The patient knows to call the clinic with any problems, questions or concerns. We can  certainly see the patient much sooner if necessary  I spent 25 minutes counseling the patient face to face.  The total time spent in the appointment was 30 minutes.  Drue Second, MD Medical/Oncology [facility] [phone] (beeper) [phone] (Office)

## 2013-06-30 NOTE — Progress Notes (Signed)
[facility]  [phone]    OFFICE PROGRESS NOTE  CC  [name], MD [address] [place] [address] Dr. Emelia Loron Dr. Chipper Herb Dr. Starleen Arms Dr. Dorothy Puffer  DIAGNOSIS: 57 year old female who presented on 12/24/2012 which new diagnosis of invasive ductal carcinoma, ER positive, PR positive, HER-2/neu positive in the left breast.    PRIOR THERAPY:  #1 patient has multiple medical problems but recently she had a mammogram performed that showed a 5.4 cm area of calcifications. MRI of the breasts showed the area extended to about 7.2 cm. Needle core biopsy performed showed a high-grade DCIS that was ER positive PR positive.  #2 she was seen by Dr. Emelia Loron regarding surgical options. Patient wanted to have bilateral mastectomies. She went on to have this performed on 12/31/2012. The final pathology did reveal a 2.3 cm invasive ductal carcinoma in the left breast. The tumor was ER +100% PR +86% HER-2/neu was amplified with a ratio of 6.81. Ki-67 was elevated at 46%. She has had immediate reconstruction.  #3 patient is seen back in medical oncology for discussion of adjuvant treatment. Since patient is HER-2/neu positive recommendation is her 2-based therapy consisting of Taxotere carboplatinum and Herceptin. The Taxotere or and carboplatinum will be given every 3 weeks for 6 cycles. She will receive Neulasta on day 2. Herceptin will be given weekly with duration of chemotherapy and then every 3 weeks to finish out a one-year of treatment. Patient also understands that she will be placed on antiestrogen therapy with an aromatase inhibitor or tamoxifen.  TCH was started on 02/04/13-05/13/2013  #4 patient will continue Herceptin every 3 weeks to finish out 1 year.  #5 she will also begin antiestrogen therapy with letrozole 2.5 mg daily starting 06/03/2013 total of 5 years of therapy is planned at this time.  CURRENT THERAPY:  Q. 3  weekly Herceptin  INTERVAL HISTORY: Patient is seen in followup today overall she seems to be doing well her breast infection is slowly, being under control. However the site still looks a little erythematous on the left side. He is denying any nausea vomiting fevers chills night sweats headaches. She is fatigued.. However she does need to continue to receive Herceptin which she is getting. She denies any nausea vomiting no myalgias and arthralgias no peripheral paresthesias. Remainder of the 10 point review of systems is negative.  MEDICAL HISTORY: Past Medical History  Diagnosis Date  . IBS (irritable bowel syndrome)   . ADD (attention deficit disorder)   . Lyme disease   . Contact lens/glasses fitting     wears contacts or glasses  . Breast cancer 12/04/12    left, ER/PR +  . Allergy   . Asthma   . Thyroid disease   . Hypothyroidism     ALLERGIES:  is allergic to betadine; dilaudid; iodine; penicillins; shellfish-derived products; sulfamethoxazole w-trimethoprim; and cleocin.  MEDICATIONS:  Current Outpatient Prescriptions  Medication Sig Dispense Refill  . albuterol (PROVENTIL HFA;VENTOLIN HFA) 108 (90 BASE) MCG/ACT inhaler Inhale 2 puffs into the lungs every 6 (six) hours as needed for shortness of breath.       . B Complex-C (B-COMPLEX WITH VITAMIN C) tablet Take 1 tablet by mouth daily.      . cephALEXin (KEFLEX) 500 MG capsule Take 1 capsule (500 mg total) by mouth 4 (four) times daily.  40 capsule  3  . dextroamphetamine (DEXTROSTAT) 5 MG tablet Take 2.5 mg by mouth 3 (three) times daily.       [name]  letrozole (FEMARA) 2.5 MG tablet Take 1 tablet (2.5 mg total) by mouth daily.  90 tablet  6  . levothyroxine (SYNTHROID, LEVOTHROID) 100 MCG tablet Take 100 mcg by mouth daily.      [name] lidocaine-prilocaine (EMLA) cream Apply 1 application topically as needed (for port-a-cath access).      [name] loratadine (CLARITIN) 10 MG tablet Take 10 mg by mouth daily as needed (for 5 days after  Neulasta injection).      . LORazepam (ATIVAN) 1 MG tablet Take 1 tablet (1 mg total) by mouth every 8 (eight) hours as needed for anxiety.  60 tablet  0  . Multiple Vitamin (MULTIVITAMIN WITH MINERALS) TABS Take 1 tablet by mouth daily.      . sennosides-docusate sodium (SENOKOT-S) 8.6-50 MG tablet Take 1 tablet by mouth daily.       . vitamin C (ASCORBIC ACID) 500 MG tablet Take 500 mg by mouth daily.      [name] aspirin 81 MG tablet Take 81 mg by mouth daily.      . ciprofloxacin (CIPRO) 500 MG tablet Take 500 mg by mouth 2 (two) times daily. Take for 10 days. First dose on 06/14/13      . EPINEPHrine (EPIPEN) 0.3 mg/0.3 mL DEVI Inject 0.3 mg into the muscle as needed (for allergic reaction).       [name] PRESCRIPTION MEDICATION Inject into the vein as directed. Herceptin infusion weekly on Thursdays; Taxotere and Paraplatin q3weeks with Neulasta support.       No current facility-administered medications for this visit.    SURGICAL HISTORY:  Past Surgical History  Procedure Laterality Date  . Brain surgery      removal benign frontal lobe cyst in 1987  . Foot surgery  2013    bilateral  . Gynecologic cryosurgery      cervical cancer, 1998 last remained disease free with recent normal pap  . Diagnostic laparoscopy  2004    lysis adh-uterine ablation  . Breast surgery      bilateral mastopexy  . Breast surgery      removal benign left breast lesion  . Abdominal hysterectomy  2005    TAH/BSO-lysis adh, enometriosis  . Face lift    . Axillary sentinel node biopsy  12/31/2012    Procedure: AXILLARY SENTINEL NODE BIOPSY;  Surgeon: Emelia Loron, MD;  Location: [facility];  Service: General;  Laterality: Bilateral;  bilateral sentinel node  . Total mastectomy  12/31/2012    Procedure: TOTAL MASTECTOMY;  Surgeon: Emelia Loron, MD;  Location: [facility];  Service: General;  Laterality: Bilateral;  bilateral total mastectomies  . Breast reconstruction with  placement of tissue expander and flex hd (acellular hydrated dermis)  12/31/2012    Procedure: BREAST RECONSTRUCTION WITH PLACEMENT OF TISSUE EXPANDER AND FLEX HD (ACELLULAR HYDRATED DERMIS);  Surgeon: Wayland Denis, DO;  Location: [facility];  Service: Plastics;  Laterality: Bilateral;  bilateral immediate breast reconstruction with expanders and flex hd   . Portacath placement  01/27/2013    Procedure: INSERTION PORT-A-CATH;  Surgeon: Emelia Loron, MD;  Location: [facility];  Service: General;  Laterality: Right;  . Incision and drainage of wound Left 04/28/2013    Procedure: IRRIGATION AND DEBRIDEMENT LEFT BREAST WOUND, POSSIBLE CLOSURE OF WOUND WITH DRAIN PLACEMENT;  Surgeon: Wayland Denis, DO;  Location: [facility];  Service: Plastics;  Laterality: Left;  [name] Mastectomy  1/14  . Tee without cardioversion N/A 06/04/2013  Procedure: TRANSESOPHAGEAL ECHOCARDIOGRAM (TEE);  Surgeon: Dolores Patty, MD;  Location: [facility];  Service: Cardiovascular;  Laterality: N/A;  . Tissue expander placement Left 06/16/2013    Procedure: LEFT BREAST TISSUE EXPANDER REMOVAL;  Surgeon: Wayland Denis, DO;  Location: [facility];  Service: Plastics;  Laterality: Left;    REVIEW OF SYSTEMS:  General: fatigue (+), night sweats (-), fever (+), pain (-) Lymph: palpable nodes (-) HEENT: vision changes (-), mucositis (-), gum bleeding (-), epistaxis (-) Cardiovascular: chest pain (-), palpitations (-) Pulmonary: shortness of breath (-), dyspnea on exertion (-), cough (-), hemoptysis (-) GI:  Early satiety (-), melena (-), dysphagia (-), nausea/vomiting (-), diarrhea (-) GU: dysuria (-), hematuria (-), incontinence (-) Musculoskeletal: joint swelling (-), joint pain (-), back pain (-) Neuro: weakness (-), numbness (-), headache (-), confusion (-) Skin: Rash (-), lesions (-), dryness (-) Psych: depression (-), suicidal/homicidal ideation (-), feeling of  hopelessness (-)  PHYSICAL EXAMINATION:  Blood pressure 122/82, pulse 76, temperature 97.6 F (36.4 C), temperature source Oral, resp. rate 20, height 5\' 5"  (1.651 m), weight 157 lb 1.6 oz (71.26 kg). Body mass index is 26.14 kg/(m^2). General: Patient is a well appearing female in no acute distress HEENT: PERRLA, sclerae anicteric no conjunctival pallor, MMM Neck: supple, no palpable adenopathy Lungs: clear to auscultation bilaterally, no wheezes, rhonchi, or rales. Right port negative for discharge, tenderness or erythema. Cardiovascular: regular rate rhythm, S1, S2, no murmurs, rubs or gallops Abdomen: Soft, non-tender, non-distended, normoactive bowel sounds, no HSM Extremities: warm and well perfused, no clubbing, cyanosis, or edema Skin: No rashes or lesions Neuro: Non-focal Breasts: She status post bilateral mastectomies with expander is in place. The cosmetic result is symmetrical.  Both axillae are benign. Her left breast is healing well, it is erythematous on the left lower outer quadrant and possibly fluctuant.  Slightly warm, and non-tender.   ECOG PERFORMANCE STATUS: 1 - Symptomatic but completely ambulatory  LABORATORY DATA: Lab Results  Component Value Date   WBC 8.6 06/10/2013   HGB 11.3* 06/16/2013   HCT 33.4* 06/10/2013   MCV 87.9 06/10/2013   PLT 235 06/10/2013      Chemistry      Component Value Date/Time   NA 138 06/10/2013 1312   NA 137 04/14/2013 0430   K 3.7 06/10/2013 1312   K 3.9 04/14/2013 0430   CL 104 06/10/2013 1312   CL 103 04/14/2013 0430   CO2 26 06/10/2013 1312   CO2 27 04/14/2013 0430   BUN 18.7 06/10/2013 1312   BUN 13 04/14/2013 0430   CREATININE 0.6 06/10/2013 1312   CREATININE 0.55 04/14/2013 0430      Component Value Date/Time   CALCIUM 9.1 06/10/2013 1312   CALCIUM 8.2* 04/14/2013 0430   ALKPHOS 45 06/10/2013 1312   ALKPHOS 50 04/13/2013 2030   AST 19 06/10/2013 1312   AST 26 04/13/2013 2030   ALT 19 06/10/2013 1312   ALT 36* 04/13/2013 2030    BILITOT 0.34 06/10/2013 1312   BILITOT 0.5 04/13/2013 2030     12/31/2012 ADDITIONAL INFORMATION: 1. CHROMOGENIC IN-SITU HYBRIDIZATION Interpretation: HER2/NEU BY CISH - SHOWS AMPLIFICATION BY CISH ANALYSIS. THE RATIO OF HER2: CEP 17 SIGNALS WAS 6.81 Reference range: Ratio: HER2:CEP17 < 1.8 gene amplification not observed Ratio: HER2:CEP 17 1.8-2.2 - equivocal result Ratio: HER2:CEP17 > 2.2 - gene amplification observed Pecola Leisure MD Pathologist, Electronic Signature ( Signed 01/11/2013) 1. PROGNOSTIC INDICATORS - ACIS Results IMMUNOHISTOCHEMICAL AND MORPHOMETRIC ANALYSIS BY THE AUTOMATED CELLULAR  IMAGING SYSTEM (ACIS) Estrogen Receptor (Negative, <1%): 99%, STRONG STAINING INTENSITY Progesterone Receptor (Negative, <1%): 10%, STRONG STAINING INTENSITY Proliferation Marker Ki67 by M IB-1 (Low<20%): 46% All controls stained appropriately Pecola Leisure MD Pathologist, Electronic Signature ( Signed 01/06/2013) 1 of 4 FINAL for HULL, Marisella G [id number]) FINAL DIAGNOSIS Diagnosis 1. Breast, simple mastectomy, Left - INVASIVE DUCTAL CARCINOMA, GRADE II (2.3 CM), SEE COMMENT. - LYMPHOVASCULAR INVASION IDENTIFIED - INVASIVE TUMOR IS 3 CM FROM THE NEAREST MARGIN (DEEP). - HIGH GRADE DUCTAL CARCINOMA IN SITU WITH COMEDO NECROSIS AND CALCIFICATIONS. - SEE TUMOR SYNOPTIC TEMPLATE BELOW. 2. Lymph node, sentinel, biopsy, Left axillary - ONE LYMPH NODE, NEGATIVE FOR TUMOR (0/1). 3. Breast, simple mastectomy, Right - BENIGN BREAST TISSUE, SEE COMMENT. - NEGATIVE FOR ATYPIA OR MALIGNANCY. 4. Lymph node, sentinel, biopsy, Right axillary - ONE LYMPH NODE, NEGATIVE FOR TUMOR (0/1). Microscopic Comment 1. BREAST, INVASIVE TUMOR, WITH LYMPH NODE SAMPLING Specimen, including laterality: Left breast Procedure: Simple mastectomy Grade: II of III Tubule formation: 3 Nuclear pleomorphism: 2 Mitotic: 1 Tumor size (gross measurement) 2.3 cm Margins: Invasive, distance to closest margin: 3.0  cm In-situ, distance to closest margin: 3.0 cm (deep) If margin positive, focally or broadly: N/A Lymphovascular invasion: Present, extensive Ductal carcinoma in situ: Present Grade: III of III Extensive intraductal component: Absent Lobular neoplasia: Absent Tumor focality: Unifocal Treatment effect: None If present, treatment effect in breast tissue, lymph nodes or both: N/A Extent of tumor: Skin: Grossly negative for tumor Nipple: Grossly negative for tumor Skeletal muscle: Microscopically negative for tumor Lymph nodes: # examined: 1 Lymph nodes with metastasis: 0 Breast prognostic profile: Estrogen receptor: Repeated, previous study shows 100% positivity. [phone]) Progesterone receptor: Repeated, previous study demonstrated 86% positivity ([id number]) Her 2 neu: Pending and will be reported in an addendum. Ki-67: Pending and will be reported in an addendum. Non-neoplastic breast: No significant findings. TNM: pT2, pN0, pMX Comments: None. (CR:kh 01-04-13) 3. The entire 2.0 cm previous biopsy hematoma is submitted for histopathologic review. On review, there is 2 of 4 FINAL for HULL, Kaicee G [phone]) Microscopic Comment(continued) no in situ or invasive carcinoma identified in any of the slide sections examined. Additional non-neoplastic findings include benign fibrocystic change, pseudoangiomatous stromal hyperplasia, fibroadenomatoid nodules, periductal chronic inflammation with associated stromal fibrosis and microcalcifications in benign ducts and lobules. Pathologic Stage is pTX pN0, PMX. The surgical resection margin(s) of the specimen were inked and microscopically evaluated. [place] [facility] RADIOGRAPHIC STUDIES:  Mr Biopsy/wire Localization  12/25/2012  *RADIOLOGY REPORT*  Clinical Data:  6 mm enhancing mass in the upper right breast.  The patient has recent diagnosis of left breast DCIS.  MRI GUIDED VACUUM ASSISTED BIOPSY OF THE RIGHT BREAST WITHOUT AND WITH CONTRAST   Comparison: Previous exams.  Technique: Multiplanar, multisequence MR images of the right breast were obtained prior to and following the intravenous administration of 15 ml of Mulithance.  I met with the patient, and we discussed the procedure of MRI guided biopsy, including risks, benefits, and alternatives. Specifically, we discussed the risks of infection, bleeding, tissue injury, clip migration, and inadequate sampling.  Informed, written consent was given.  Using sterile technique, 2% Lidocaine, MRI guidance, and a 9 gauge vacuum assisted device, biopsy was performed of a 6 mm enhancing mass using a lateral to medial approach.  At the conclusion of the procedure, a tissue marker clip was deployed into the biopsy cavity.  IMPRESSION: MRI guided biopsy of the right breast. No apparent complications.  THREE-DIMENSIONAL MR IMAGE RENDERING ON INDEPENDENT  WORKSTATION:  Three-dimensional MR images were rendered by post-processing of the original MR data on an independent workstation.  The three- dimensional MR images were interpreted, and findings were reported in the accompanying complete MRI report for this study.   Original Report Authenticated By: Britta Mccreedy, M.D.    Nm Sentinel Node Inj-no Rpt (breast)  12/31/2012  CLINICAL DATA: right axillary sentinel node biopsy   Sulfur colloid was injected intradermally by the nuclear medicine  technologist for breast cancer sentinel node localization.     Nm Sentinel Node Inj-no Rpt (breast)  12/31/2012  CLINICAL DATA: left axillary sentinel node biopsy   Sulfur colloid was injected intradermally by the nuclear medicine  technologist for breast cancer sentinel node localization.     Mm Digital Diagnostic Unilat R  12/25/2012  *RADIOLOGY REPORT*  Clinical Data:  MRI guided core needle biopsy of a 6 mm nodule of enhancement in the upper right breast was performed.  DIGITAL DIAGNOSTIC RIGHT MAMMOGRAM  Comparison:  Previous exams.  Findings:  Films are performed  following MRI guided biopsy of 6 mm enhancing nodule in the upper central right breast.  A dumbbell shaped biopsy clip is present in the upper right breast just medial to the level of the nipple.  The clip is within a biopsy cavity that contains air and a small air-fluid level.  IMPRESSION: Satisfactory position of biopsy clip in the upper right breast.   Original Report Authenticated By: Britta Mccreedy, M.D.     ASSESSMENT: 57 year old [place] woman  (1) status post bilateral mastectomies in 12/31/2012 showing  (a) on the right, no evidence of malignancy  (b) on the left, a pT2 pN0, stage IIA invasive ductal carcinoma, grade 2, estrogen receptor 100% positive, progesterone receptor 86% positive, with an MIB-1 of 46%, and HER-2 amplification with a CISH ratio of 6.81  (2) starting adjuvant chemotherapy with carboplatin, docetaxel and trastuzumab February13 2014, to consist of 6 cycles. However patient could not tolerate all of her chemotherapy so therefore she requested that we discontinued the last cycle of her treatment which would have been cycle #6. Therefore she has only received 5 of 6 cycles. However patient will continue Herceptin every 3 weeks.  #3 she will also proceed with receiving adjuvant antiestrogen therapy since her tumor was ER positive. We discussed beginning her on letrozole 2.5 mg daily. She understands the risks and benefits and literature was given to her.  #4 patient also does have ongoing issues with her left breast with possible infection I did give her Keflex refill.    PLAN:   #1 Proceed with Herceptin.   #2 patient will return in 3 weeks' time for Herceptin again.   All questions were answered. The patient knows to call the clinic with any problems, questions or concerns. We can certainly see the patient much sooner if necessary  I spent 25 minutes counseling the patient face to face.  The total time spent in the appointment was 30 minutes.  Drue Second,  MD Medical/Oncology [facility] [phone] (beeper) [phone] (Office)

## 2013-07-01 ENCOUNTER — Ambulatory Visit: Payer: 59 | Admitting: Oncology

## 2013-07-01 ENCOUNTER — Ambulatory Visit: Payer: 59

## 2013-07-01 ENCOUNTER — Other Ambulatory Visit: Payer: 59 | Admitting: Lab

## 2013-07-02 ENCOUNTER — Encounter: Payer: Self-pay | Admitting: Adult Health

## 2013-07-02 ENCOUNTER — Other Ambulatory Visit: Payer: Self-pay | Admitting: Oncology

## 2013-07-02 ENCOUNTER — Ambulatory Visit (HOSPITAL_BASED_OUTPATIENT_CLINIC_OR_DEPARTMENT_OTHER): Payer: 59 | Admitting: Adult Health

## 2013-07-02 ENCOUNTER — Encounter: Payer: Self-pay | Admitting: Oncology

## 2013-07-02 ENCOUNTER — Ambulatory Visit (HOSPITAL_BASED_OUTPATIENT_CLINIC_OR_DEPARTMENT_OTHER): Payer: 59

## 2013-07-02 ENCOUNTER — Other Ambulatory Visit (HOSPITAL_BASED_OUTPATIENT_CLINIC_OR_DEPARTMENT_OTHER): Payer: 59

## 2013-07-02 VITALS — BP 106/69 | HR 78 | Temp 98.6°F | Resp 20 | Ht 65.0 in | Wt 155.3 lb

## 2013-07-02 DIAGNOSIS — N6489 Other specified disorders of breast: Secondary | ICD-10-CM

## 2013-07-02 DIAGNOSIS — C50419 Malignant neoplasm of upper-outer quadrant of unspecified female breast: Secondary | ICD-10-CM

## 2013-07-02 DIAGNOSIS — E039 Hypothyroidism, unspecified: Secondary | ICD-10-CM

## 2013-07-02 DIAGNOSIS — C50412 Malignant neoplasm of upper-outer quadrant of left female breast: Secondary | ICD-10-CM

## 2013-07-02 DIAGNOSIS — Z5112 Encounter for antineoplastic immunotherapy: Secondary | ICD-10-CM

## 2013-07-02 DIAGNOSIS — Z170 Estrogen receptor positive status [ER+]: Secondary | ICD-10-CM

## 2013-07-02 LAB — CBC WITH DIFFERENTIAL/PLATELET
Basophils Absolute: 0 10*3/uL (ref 0.0–0.1)
Eosinophils Absolute: 0.1 10*3/uL (ref 0.0–0.5)
HGB: 12 g/dL (ref 11.6–15.9)
LYMPH%: 41.1 % (ref 14.0–49.7)
MONO#: 0.4 10*3/uL (ref 0.1–0.9)
NEUT#: 2.5 10*3/uL (ref 1.5–6.5)
Platelets: 185 10*3/uL (ref 145–400)
RBC: 4.25 10*6/uL (ref 3.70–5.45)
RDW: 14.7 % — ABNORMAL HIGH (ref 11.2–14.5)
WBC: 5.1 10*3/uL (ref 3.9–10.3)

## 2013-07-02 LAB — COMPREHENSIVE METABOLIC PANEL (CC13)
ALT: 22 U/L (ref 0–55)
AST: 26 U/L (ref 5–34)
Albumin: 3.3 g/dL — ABNORMAL LOW (ref 3.5–5.0)
CO2: 27 mEq/L (ref 22–29)
Calcium: 9.3 mg/dL (ref 8.4–10.4)
Chloride: 106 mEq/L (ref 98–109)
Creatinine: 0.7 mg/dL (ref 0.6–1.1)
Potassium: 4 mEq/L (ref 3.5–5.1)
Total Protein: 6.7 g/dL (ref 6.4–8.3)

## 2013-07-02 MED ORDER — HEPARIN SOD (PORK) LOCK FLUSH 100 UNIT/ML IV SOLN
500.0000 [IU] | Freq: Once | INTRAVENOUS | Status: AC | PRN
  Administered 2013-07-02: 500 [IU]
  Filled 2013-07-02: qty 5

## 2013-07-02 MED ORDER — SODIUM CHLORIDE 0.9 % IV SOLN
Freq: Once | INTRAVENOUS | Status: AC
  Administered 2013-07-02: 11:00:00 via INTRAVENOUS

## 2013-07-02 MED ORDER — SODIUM CHLORIDE 0.9 % IV SOLN
6.0000 mg/kg | Freq: Once | INTRAVENOUS | Status: AC
  Administered 2013-07-02: 420 mg via INTRAVENOUS
  Filled 2013-07-02: qty 20

## 2013-07-02 MED ORDER — LIDOCAINE-PRILOCAINE 2.5-2.5 % EX CREA: 1.0000 "application " | TOPICAL_CREAM | CUTANEOUS | Status: DC | PRN

## 2013-07-02 MED ORDER — SODIUM CHLORIDE 0.9 % IJ SOLN
10.0000 mL | INTRAMUSCULAR | Status: DC | PRN
  Administered 2013-07-02: 10 mL
  Filled 2013-07-02: qty 10

## 2013-07-02 MED ORDER — ACETAMINOPHEN 325 MG PO TABS
650.0000 mg | ORAL_TABLET | Freq: Once | ORAL | Status: AC
  Administered 2013-07-02: 650 mg via ORAL

## 2013-07-02 NOTE — Progress Notes (Signed)
[facility]  [phone]    OFFICE PROGRESS NOTE  CC  [name], MD [address] [place] [address] Dr. Emelia Loron Dr. Chipper Herb Dr. Starleen Arms Dr. Dorothy Puffer  DIAGNOSIS: 57 year old female who presented on 12/24/2012 which new diagnosis of invasive ductal carcinoma, ER positive, PR positive, HER-2/neu positive in the left breast.    PRIOR THERAPY:  #1 patient has multiple medical problems but recently she had a mammogram performed that showed a 5.4 cm area of calcifications. MRI of the breasts showed the area extended to about 7.2 cm. Needle core biopsy performed showed a high-grade DCIS that was ER positive PR positive.  #2 she was seen by Dr. Emelia Loron regarding surgical options. Patient wanted to have bilateral mastectomies. She went on to have this performed on 12/31/2012. The final pathology did reveal a 2.3 cm invasive ductal carcinoma in the left breast. The tumor was ER +100% PR +86% HER-2/neu was amplified with a ratio of 6.81. Ki-67 was elevated at 46%. She has had immediate reconstruction.  #3 patient is seen back in medical oncology for discussion of adjuvant treatment. Since patient is HER-2/neu positive recommendation is her 2-based therapy consisting of Taxotere carboplatinum and Herceptin. The Taxotere or and carboplatinum will be given every 3 weeks for 6 cycles. She will receive Neulasta on day 2. Herceptin will be given weekly with duration of chemotherapy and then every 3 weeks to finish out a one-year of treatment. Patient also understands that she will be placed on antiestrogen therapy with an aromatase inhibitor or tamoxifen.  TCH was started on 02/04/13-05/13/2013  #4 patient will continue Herceptin every 3 weeks to finish out 1 year.  #5 she will also begin antiestrogen therapy with letrozole 2.5 mg daily starting 06/03/2013 total of 5 years of therapy is planned at this time.  CURRENT THERAPY:  Q. 3  weekly Herceptin/Letrozole therapy  INTERVAL HISTORY: Patient is here for follow up and adjuvant treatment of her invasive ductal carcinoma.  She is receiving Herceptin every three weeks, and was recently started on Letrozole daily.  She states that she stopped taking the Letrozole three days ago due to severe hot flashes, and joint aches.  She wants to know if it is possible to decrease her dose.  Her left breast implant was removed by Dr. Kelly Splinter, and the skin is non-erythematous and healing well.  The plan is for a TRAM in the future.  She denies fevers, chills, nausea, vomiting, constipation, diarrhea, pain or any other concerns.   MEDICAL HISTORY: Past Medical History  Diagnosis Date  . IBS (irritable bowel syndrome)   . ADD (attention deficit disorder)   . Lyme disease   . Contact lens/glasses fitting     wears contacts or glasses  . Breast cancer 12/04/12    left, ER/PR +  . Allergy   . Asthma   . Thyroid disease   . Hypothyroidism     ALLERGIES:  is allergic to betadine; dilaudid; iodine; penicillins; shellfish-derived products; sulfamethoxazole w-trimethoprim; and cleocin.  MEDICATIONS:  Current Outpatient Prescriptions  Medication Sig Dispense Refill  . albuterol (PROVENTIL HFA;VENTOLIN HFA) 108 (90 BASE) MCG/ACT inhaler Inhale 2 puffs into the lungs every 6 (six) hours as needed for shortness of breath.       [name] aspirin 81 MG tablet Take 81 mg by mouth daily.      . B Complex-C (B-COMPLEX WITH VITAMIN C) tablet Take 1 tablet by mouth daily.      [name]  cephALEXin (KEFLEX) 500 MG capsule Take 1 capsule (500 mg total) by mouth 4 (four) times daily.  40 capsule  3  . ciprofloxacin (CIPRO) 500 MG tablet Take 500 mg by mouth 2 (two) times daily. Take for 10 days. First dose on 06/14/13      . dextroamphetamine (DEXTROSTAT) 5 MG tablet Take 2.5 mg by mouth 3 (three) times daily.       [name] EPINEPHrine (EPIPEN) 0.3 mg/0.3 mL DEVI Inject 0.3 mg into the muscle as needed (for allergic  reaction).       [name] levothyroxine (SYNTHROID, LEVOTHROID) 100 MCG tablet Take 100 mcg by mouth daily.      [name] lidocaine-prilocaine (EMLA) cream Apply 1 application topically as needed (for port-a-cath access).      [name] loratadine (CLARITIN) 10 MG tablet Take 10 mg by mouth daily as needed (for 5 days after Neulasta injection).      . LORazepam (ATIVAN) 1 MG tablet Take 1 tablet (1 mg total) by mouth every 8 (eight) hours as needed for anxiety.  60 tablet  0  . Multiple Vitamin (MULTIVITAMIN WITH MINERALS) TABS Take 1 tablet by mouth daily.      [name] PRESCRIPTION MEDICATION Inject into the vein as directed. Herceptin infusion weekly on Thursdays; Taxotere and Paraplatin q3weeks with Neulasta support.      . sennosides-docusate sodium (SENOKOT-S) 8.6-50 MG tablet Take 1 tablet by mouth daily.       . vitamin C (ASCORBIC ACID) 500 MG tablet Take 500 mg by mouth daily.      [name] letrozole (FEMARA) 2.5 MG tablet Take 1 tablet (2.5 mg total) by mouth daily.  90 tablet  6   No current facility-administered medications for this visit.    SURGICAL HISTORY:  Past Surgical History  Procedure Laterality Date  . Brain surgery      removal benign frontal lobe cyst in 1987  . Foot surgery  2013    bilateral  . Gynecologic cryosurgery      cervical cancer, 1998 last remained disease free with recent normal pap  . Diagnostic laparoscopy  2004    lysis adh-uterine ablation  . Breast surgery      bilateral mastopexy  . Breast surgery      removal benign left breast lesion  . Abdominal hysterectomy  2005    TAH/BSO-lysis adh, enometriosis  . Face lift    . Axillary sentinel node biopsy  12/31/2012    Procedure: AXILLARY SENTINEL NODE BIOPSY;  Surgeon: Emelia Loron, MD;  Location: [facility];  Service: General;  Laterality: Bilateral;  bilateral sentinel node  . Total mastectomy  12/31/2012    Procedure: TOTAL MASTECTOMY;  Surgeon: Emelia Loron, MD;  Location: [facility];   Service: General;  Laterality: Bilateral;  bilateral total mastectomies  . Breast reconstruction with placement of tissue expander and flex hd (acellular hydrated dermis)  12/31/2012    Procedure: BREAST RECONSTRUCTION WITH PLACEMENT OF TISSUE EXPANDER AND FLEX HD (ACELLULAR HYDRATED DERMIS);  Surgeon: Wayland Denis, DO;  Location: [facility];  Service: Plastics;  Laterality: Bilateral;  bilateral immediate breast reconstruction with expanders and flex hd   . Portacath placement  01/27/2013    Procedure: INSERTION PORT-A-CATH;  Surgeon: Emelia Loron, MD;  Location:  SURGERY CENTER;  Service: General;  Laterality: Right;  . Incision and drainage of wound Left 04/28/2013    Procedure: IRRIGATION AND DEBRIDEMENT LEFT BREAST WOUND, POSSIBLE CLOSURE OF WOUND WITH DRAIN PLACEMENT;  Surgeon: Wayland Denis, DO;  Location: [facility];  Service: Plastics;  Laterality: Left;  [name] Mastectomy  1/14  . Tee without cardioversion N/A 06/04/2013    Procedure: TRANSESOPHAGEAL ECHOCARDIOGRAM (TEE);  Surgeon: Dolores Patty, MD;  Location: [facility];  Service: Cardiovascular;  Laterality: N/A;  . Tissue expander placement Left 06/16/2013    Procedure: LEFT BREAST TISSUE EXPANDER REMOVAL;  Surgeon: Wayland Denis, DO;  Location: [facility];  Service: Plastics;  Laterality: Left;    REVIEW OF SYSTEMS:  General: fatigue (+), night sweats (-), fever (+), pain (-) Lymph: palpable nodes (-) HEENT: vision changes (-), mucositis (-), gum bleeding (-), epistaxis (-) Cardiovascular: chest pain (-), palpitations (-) Pulmonary: shortness of breath (-), dyspnea on exertion (-), cough (-), hemoptysis (-) GI:  Early satiety (-), melena (-), dysphagia (-), nausea/vomiting (-), diarrhea (-) GU: dysuria (-), hematuria (-), incontinence (-) Musculoskeletal: joint swelling (-), joint pain (-), back pain (-) Neuro: weakness (-), numbness (-), headache (-), confusion (-) Skin:  Rash (-), lesions (-), dryness (-) Psych: depression (-), suicidal/homicidal ideation (-), feeling of hopelessness (-)  PHYSICAL EXAMINATION:  Blood pressure 106/69, pulse 78, temperature 98.6 F (37 C), temperature source Oral, resp. rate 20, height 5\' 5"  (1.651 m), weight 155 lb 4.8 oz (70.444 kg). Body mass index is 25.84 kg/(m^2). General: Patient is a well appearing female in no acute distress HEENT: PERRLA, sclerae anicteric no conjunctival pallor, MMM Neck: supple, no palpable adenopathy Lungs: clear to auscultation bilaterally, no wheezes, rhonchi, or rales. Right port negative for discharge, tenderness or erythema. Cardiovascular: regular rate rhythm, S1, S2, no murmurs, rubs or gallops Abdomen: Soft, non-tender, non-distended, normoactive bowel sounds, no HSM Extremities: warm and well perfused, no clubbing, cyanosis, or edema Skin: No rashes or lesions Neuro: Non-focal Breasts: She status post bilateral mastectomies with expander is in place. The cosmetic result is symmetrical.  Both axillae are benign. Her left breast is healing well, it is erythematous on the left lower outer quadrant and possibly fluctuant.  Slightly warm, and non-tender.   ECOG PERFORMANCE STATUS: 1 - Symptomatic but completely ambulatory  LABORATORY DATA: Lab Results  Component Value Date   WBC 5.1 07/02/2013   HGB 12.0 07/02/2013   HCT 36.4 07/02/2013   MCV 85.6 07/02/2013   PLT 185 07/02/2013      Chemistry      Component Value Date/Time   NA 138 06/10/2013 1312   NA 137 04/14/2013 0430   K 3.7 06/10/2013 1312   K 3.9 04/14/2013 0430   CL 104 06/10/2013 1312   CL 103 04/14/2013 0430   CO2 26 06/10/2013 1312   CO2 27 04/14/2013 0430   BUN 18.7 06/10/2013 1312   BUN 13 04/14/2013 0430   CREATININE 0.6 06/10/2013 1312   CREATININE 0.55 04/14/2013 0430      Component Value Date/Time   CALCIUM 9.1 06/10/2013 1312   CALCIUM 8.2* 04/14/2013 0430   ALKPHOS 45 06/10/2013 1312   ALKPHOS 50 04/13/2013 2030   AST  19 06/10/2013 1312   AST 26 04/13/2013 2030   ALT 19 06/10/2013 1312   ALT 36* 04/13/2013 2030   BILITOT 0.34 06/10/2013 1312   BILITOT 0.5 04/13/2013 2030     12/31/2012 ADDITIONAL INFORMATION: 1. CHROMOGENIC IN-SITU HYBRIDIZATION Interpretation: HER2/NEU BY CISH - SHOWS AMPLIFICATION BY CISH ANALYSIS. THE RATIO OF HER2: CEP 17 SIGNALS WAS 6.81 Reference range: Ratio: HER2:CEP17 < 1.8 gene amplification not observed Ratio: HER2:CEP 17 1.8-2.2 - equivocal result  Ratio: HER2:CEP17 > 2.2 - gene amplification observed Pecola Leisure MD Pathologist, Electronic Signature ( Signed 01/11/2013) 1. PROGNOSTIC INDICATORS - ACIS Results IMMUNOHISTOCHEMICAL AND MORPHOMETRIC ANALYSIS BY THE AUTOMATED CELLULAR IMAGING SYSTEM (ACIS) Estrogen Receptor (Negative, <1%): 99%, STRONG STAINING INTENSITY Progesterone Receptor (Negative, <1%): 10%, STRONG STAINING INTENSITY Proliferation Marker Ki67 by M IB-1 (Low<20%): 46% All controls stained appropriately Pecola Leisure MD Pathologist, Electronic Signature ( Signed 01/06/2013) 1 of 4 FINAL for HULL, Filippa G [phone]) FINAL DIAGNOSIS Diagnosis 1. Breast, simple mastectomy, Left - INVASIVE DUCTAL CARCINOMA, GRADE II (2.3 CM), SEE COMMENT. - LYMPHOVASCULAR INVASION IDENTIFIED - INVASIVE TUMOR IS 3 CM FROM THE NEAREST MARGIN (DEEP). - HIGH GRADE DUCTAL CARCINOMA IN SITU WITH COMEDO NECROSIS AND CALCIFICATIONS. - SEE TUMOR SYNOPTIC TEMPLATE BELOW. 2. Lymph node, sentinel, biopsy, Left axillary - ONE LYMPH NODE, NEGATIVE FOR TUMOR (0/1). 3. Breast, simple mastectomy, Right - BENIGN BREAST TISSUE, SEE COMMENT. - NEGATIVE FOR ATYPIA OR MALIGNANCY. 4. Lymph node, sentinel, biopsy, Right axillary - ONE LYMPH NODE, NEGATIVE FOR TUMOR (0/1). Microscopic Comment 1. BREAST, INVASIVE TUMOR, WITH LYMPH NODE SAMPLING Specimen, including laterality: Left breast Procedure: Simple mastectomy Grade: II of III Tubule formation: 3 Nuclear pleomorphism: 2 Mitotic:  1 Tumor size (gross measurement) 2.3 cm Margins: Invasive, distance to closest margin: 3.0 cm In-situ, distance to closest margin: 3.0 cm (deep) If margin positive, focally or broadly: N/A Lymphovascular invasion: Present, extensive Ductal carcinoma in situ: Present Grade: III of III Extensive intraductal component: Absent Lobular neoplasia: Absent Tumor focality: Unifocal Treatment effect: None If present, treatment effect in breast tissue, lymph nodes or both: N/A Extent of tumor: Skin: Grossly negative for tumor Nipple: Grossly negative for tumor Skeletal muscle: Microscopically negative for tumor Lymph nodes: # examined: 1 Lymph nodes with metastasis: 0 Breast prognostic profile: Estrogen receptor: Repeated, previous study shows 100% positivity. [phone]) Progesterone receptor: Repeated, previous study demonstrated 86% positivity ([id number]) Her 2 neu: Pending and will be reported in an addendum. Ki-67: Pending and will be reported in an addendum. Non-neoplastic breast: No significant findings. TNM: pT2, pN0, pMX Comments: None. (CR:kh 01-04-13) 3. The entire 2.0 cm previous biopsy hematoma is submitted for histopathologic review. On review, there is 2 of 4 FINAL for HULL, Vika G [phone]) Microscopic Comment(continued) no in situ or invasive carcinoma identified in any of the slide sections examined. Additional non-neoplastic findings include benign fibrocystic change, pseudoangiomatous stromal hyperplasia, fibroadenomatoid nodules, periductal chronic inflammation with associated stromal fibrosis and microcalcifications in benign ducts and lobules. Pathologic Stage is pTX pN0, PMX. The surgical resection margin(s) of the specimen were inked and microscopically evaluated. [place] RU RADIOGRAPHIC STUDIES:  Mr Biopsy/wire Localization  12/25/2012  *RADIOLOGY REPORT*  Clinical Data:  6 mm enhancing mass in the upper right breast.  The patient has recent diagnosis of left  breast DCIS.  MRI GUIDED VACUUM ASSISTED BIOPSY OF THE RIGHT BREAST WITHOUT AND WITH CONTRAST  Comparison: Previous exams.  Technique: Multiplanar, multisequence MR images of the right breast were obtained prior to and following the intravenous administration of 15 ml of Mulithance.  I met with the patient, and we discussed the procedure of MRI guided biopsy, including risks, benefits, and alternatives. Specifically, we discussed the risks of infection, bleeding, tissue injury, clip migration, and inadequate sampling.  Informed, written consent was given.  Using sterile technique, 2% Lidocaine, MRI guidance, and a 9 gauge vacuum assisted device, biopsy was performed of a 6 mm enhancing mass using a lateral to medial approach.  At the conclusion of  the procedure, a tissue marker clip was deployed into the biopsy cavity.  IMPRESSION: MRI guided biopsy of the right breast. No apparent complications.  THREE-DIMENSIONAL MR IMAGE RENDERING ON INDEPENDENT WORKSTATION:  Three-dimensional MR images were rendered by post-processing of the original MR data on an independent workstation.  The three- dimensional MR images were interpreted, and findings were reported in the accompanying complete MRI report for this study.   Original Report Authenticated By: Britta Mccreedy, M.D.    Nm Sentinel Node Inj-no Rpt (breast)  12/31/2012  CLINICAL DATA: right axillary sentinel node biopsy   Sulfur colloid was injected intradermally by the nuclear medicine  technologist for breast cancer sentinel node localization.     Nm Sentinel Node Inj-no Rpt (breast)  12/31/2012  CLINICAL DATA: left axillary sentinel node biopsy   Sulfur colloid was injected intradermally by the nuclear medicine  technologist for breast cancer sentinel node localization.     Mm Digital Diagnostic Unilat R  12/25/2012  *RADIOLOGY REPORT*  Clinical Data:  MRI guided core needle biopsy of a 6 mm nodule of enhancement in the upper right breast was performed.  DIGITAL  DIAGNOSTIC RIGHT MAMMOGRAM  Comparison:  Previous exams.  Findings:  Films are performed following MRI guided biopsy of 6 mm enhancing nodule in the upper central right breast.  A dumbbell shaped biopsy clip is present in the upper right breast just medial to the level of the nipple.  The clip is within a biopsy cavity that contains air and a small air-fluid level.  IMPRESSION: Satisfactory position of biopsy clip in the upper right breast.   Original Report Authenticated By: Britta Mccreedy, M.D.     ASSESSMENT: 57 year old [place] woman  (1) status post bilateral mastectomies in 12/31/2012 showing  (a) on the right, no evidence of malignancy  (b) on the left, a pT2 pN0, stage IIA invasive ductal carcinoma, grade 2, estrogen receptor 100% positive, progesterone receptor 86% positive, with an MIB-1 of 46%, and HER-2 amplification with a CISH ratio of 6.81  (2) starting adjuvant chemotherapy with carboplatin, docetaxel and trastuzumab February13 2014, to consist of 6 cycles. However patient could not tolerate all of her chemotherapy so therefore she requested that we discontinued the last cycle of her treatment which would have been cycle #6. Therefore she has only received 5 of 6 cycles. However patient will continue Herceptin every 3 weeks.  #3 she will also proceed with receiving adjuvant antiestrogen therapy since her tumor was ER positive. We discussed beginning her on letrozole 2.5 mg daily. She understands the risks and benefits and literature was given to her.  #4 patient also does have ongoing issues with her left breast with possible infection I did give her Keflex refill.    PLAN:   #1 Proceed with Herceptin.   #2 patient will return in 3 weeks' time for Herceptin again.  #3 She will continue to hold the Letrozole and will discuss dosage changes or switching anti-estrogen therapy with Dr. Welton Flakes at her appointment in 3 weeks.     All questions were answered. The patient knows to  call the clinic with any problems, questions or concerns. We can certainly see the patient much sooner if necessary  I spent 25 minutes counseling the patient face to face.  The total time spent in the appointment was 30 minutes.  Cherie Ouch Lyn Hollingshead, NP Medical Oncology [facility] Phone: [phone]

## 2013-07-02 NOTE — Patient Instructions (Addendum)

## 2013-07-02 NOTE — Patient Instructions (Signed)
Continue to hold Letrozole, we will f/u with you in 3 weeks.  Please call us if you have any questions or concerns.

## 2013-07-22 ENCOUNTER — Telehealth: Payer: Self-pay | Admitting: Oncology

## 2013-07-22 ENCOUNTER — Other Ambulatory Visit (HOSPITAL_BASED_OUTPATIENT_CLINIC_OR_DEPARTMENT_OTHER): Payer: 59 | Admitting: Lab

## 2013-07-22 ENCOUNTER — Ambulatory Visit (HOSPITAL_BASED_OUTPATIENT_CLINIC_OR_DEPARTMENT_OTHER): Payer: 59 | Admitting: Oncology

## 2013-07-22 ENCOUNTER — Ambulatory Visit (HOSPITAL_BASED_OUTPATIENT_CLINIC_OR_DEPARTMENT_OTHER): Payer: 59

## 2013-07-22 ENCOUNTER — Encounter: Payer: Self-pay | Admitting: Oncology

## 2013-07-22 VITALS — BP 111/67 | HR 80 | Temp 98.3°F | Resp 20 | Ht 65.0 in | Wt 156.5 lb

## 2013-07-22 DIAGNOSIS — Z170 Estrogen receptor positive status [ER+]: Secondary | ICD-10-CM

## 2013-07-22 DIAGNOSIS — Z5112 Encounter for antineoplastic immunotherapy: Secondary | ICD-10-CM

## 2013-07-22 DIAGNOSIS — D0512 Intraductal carcinoma in situ of left breast: Secondary | ICD-10-CM

## 2013-07-22 DIAGNOSIS — C50419 Malignant neoplasm of upper-outer quadrant of unspecified female breast: Secondary | ICD-10-CM

## 2013-07-22 LAB — COMPREHENSIVE METABOLIC PANEL (CC13)
BUN: 26 mg/dL (ref 7.0–26.0)
CO2: 26 mEq/L (ref 22–29)
Calcium: 9 mg/dL (ref 8.4–10.4)
Chloride: 106 mEq/L (ref 98–109)
Creatinine: 0.7 mg/dL (ref 0.6–1.1)
Total Bilirubin: 0.23 mg/dL (ref 0.20–1.20)

## 2013-07-22 LAB — CBC WITH DIFFERENTIAL/PLATELET
BASO%: 0.3 % (ref 0.0–2.0)
EOS%: 1.1 % (ref 0.0–7.0)
HCT: 38.2 % (ref 34.8–46.6)
MCH: 27.8 pg (ref 25.1–34.0)
MCHC: 33 g/dL (ref 31.5–36.0)
MONO#: 0.4 10*3/uL (ref 0.1–0.9)
RBC: 4.54 10*6/uL (ref 3.70–5.45)
RDW: 14.2 % (ref 11.2–14.5)
WBC: 6.1 10*3/uL (ref 3.9–10.3)
lymph#: 2.3 10*3/uL (ref 0.9–3.3)
nRBC: 0 % (ref 0–0)

## 2013-07-22 MED ORDER — HEPARIN SOD (PORK) LOCK FLUSH 100 UNIT/ML IV SOLN
500.0000 [IU] | Freq: Once | INTRAVENOUS | Status: AC | PRN
  Administered 2013-07-22: 500 [IU]
  Filled 2013-07-22: qty 5

## 2013-07-22 MED ORDER — TRASTUZUMAB CHEMO INJECTION 440 MG
6.0000 mg/kg | Freq: Once | INTRAVENOUS | Status: AC
  Administered 2013-07-22: 420 mg via INTRAVENOUS
  Filled 2013-07-22: qty 20

## 2013-07-22 MED ORDER — SODIUM CHLORIDE 0.9 % IV SOLN
Freq: Once | INTRAVENOUS | Status: AC
  Administered 2013-07-22: 12:00:00 via INTRAVENOUS

## 2013-07-22 MED ORDER — ESZOPICLONE 2 MG PO TABS: 2.0000 mg | ORAL_TABLET | Freq: Every day | ORAL | Status: DC

## 2013-07-22 MED ORDER — SODIUM CHLORIDE 0.9 % IJ SOLN
10.0000 mL | INTRAMUSCULAR | Status: DC | PRN
  Administered 2013-07-22: 10 mL
  Filled 2013-07-22: qty 10

## 2013-07-22 MED ORDER — ACETAMINOPHEN 325 MG PO TABS
650.0000 mg | ORAL_TABLET | Freq: Once | ORAL | Status: AC
  Administered 2013-07-22: 650 mg via ORAL

## 2013-07-22 NOTE — Patient Instructions (Addendum)
Cancer Center Discharge Instructions for Patients Receiving Chemotherapy  Today you received Herceptin.   If you develop any of the following, please call the clinic.   BELOW ARE SYMPTOMS THAT SHOULD BE REPORTED IMMEDIATELY:  *FEVER GREATER THAN 100.5 F  *CHILLS WITH OR WITHOUT FEVER  NAUSEA AND VOMITING THAT IS NOT CONTROLLED WITH YOUR NAUSEA MEDICATION  *UNUSUAL SHORTNESS OF BREATH  *UNUSUAL BRUISING OR BLEEDING  TENDERNESS IN MOUTH AND THROAT WITH OR WITHOUT PRESENCE OF ULCERS  *URINARY PROBLEMS  *BOWEL PROBLEMS  UNUSUAL RASH Items with * indicate a potential emergency and should be followed up as soon as possible.  Feel free to call the clinic you have any questions or concerns. The clinic phone number is [phone].    

## 2013-07-22 NOTE — Progress Notes (Signed)
[facility]  [phone]    OFFICE PROGRESS NOTE  CC  [name], MD [address] [place] [address] Dr. Emelia Loron Dr. Chipper Herb Dr. Starleen Arms Dr. Dorothy Puffer  DIAGNOSIS: 57 year old female who presented on 12/24/2012 which new diagnosis of invasive ductal carcinoma, ER positive, PR positive, HER-2/neu positive in the left breast.    PRIOR THERAPY:  #1 patient has multiple medical problems but recently she had a mammogram performed that showed a 5.4 cm area of calcifications. MRI of the breasts showed the area extended to about 7.2 cm. Needle core biopsy performed showed a high-grade DCIS that was ER positive PR positive.  #2 she was seen by Dr. Emelia Loron regarding surgical options. Patient wanted to have bilateral mastectomies. She went on to have this performed on 12/31/2012. The final pathology did reveal a 2.3 cm invasive ductal carcinoma in the left breast. The tumor was ER +100% PR +86% HER-2/neu was amplified with a ratio of 6.81. Ki-67 was elevated at 46%. She has had immediate reconstruction.  #3 patient is seen back in medical oncology for discussion of adjuvant treatment. Since patient is HER-2/neu positive recommendation is her 2-based therapy consisting of Taxotere carboplatinum and Herceptin. The Taxotere or and carboplatinum will be given every 3 weeks for 6 cycles. She will receive Neulasta on day 2. Herceptin will be given weekly with duration of chemotherapy and then every 3 weeks to finish out a one-year of treatment. Patient also understands that she will be placed on antiestrogen therapy with an aromatase inhibitor or tamoxifen.  TCH was started on 02/04/13-05/13/2013  #4 patient will continue Herceptin every 3 weeks to finish out 1 year.  #5 she will also begin antiestrogen therapy with letrozole 2.5 mg daily starting 06/03/2013 total of 5 years of therapy is planned at this time.  CURRENT THERAPY:  Q. 3  weekly Herceptin/Letrozole therapy  INTERVAL HISTORY: Patient is here for follow up and adjuvant treatment of her invasive ductal carcinoma. She is here for Herceptin. We had discontinued the letrozole due to hot flashes and night sweats. She has been off of the letrozole for about 3 weeks however she tells me that the symptoms are increasing. Therefore I do not think that is the letrozole. She and I discussed restarting the letrozole and she is willing to do this. She also is asking for a prescription for Lunesta which was given to her. She otherwise denies any nausea vomiting fevers chills diarrhea or constipation. Remainder of the 10 point review of systems is negative.  MEDICAL HISTORY: Past Medical History  Diagnosis Date  . IBS (irritable bowel syndrome)   . ADD (attention deficit disorder)   . Lyme disease   . Contact lens/glasses fitting     wears contacts or glasses  . Breast cancer 12/04/12    left, ER/PR +  . Allergy   . Asthma   . Thyroid disease   . Hypothyroidism     ALLERGIES:  is allergic to betadine; dilaudid; iodine; penicillins; shellfish-derived products; sulfamethoxazole w-trimethoprim; and cleocin.  MEDICATIONS:  Current Outpatient Prescriptions  Medication Sig Dispense Refill  . albuterol (PROVENTIL HFA;VENTOLIN HFA) 108 (90 BASE) MCG/ACT inhaler Inhale 2 puffs into the lungs every 6 (six) hours as needed for shortness of breath.       [name] aspirin 81 MG tablet Take 81 mg by mouth daily.      . B Complex-C (B-COMPLEX WITH VITAMIN C) tablet Take 1 tablet by mouth daily.      [name]  dextroamphetamine (DEXTROSTAT) 5 MG tablet Take 2.5 mg by mouth 3 (three) times daily.       [name] levothyroxine (SYNTHROID, LEVOTHROID) 100 MCG tablet Take 100 mcg by mouth daily.      [name] lidocaine-prilocaine (EMLA) cream Apply 1 application topically as needed (for port-a-cath access).  30 [facility]  2  . loratadine (CLARITIN) 10 MG tablet Take 10 mg by mouth daily as needed (for 5 days after Neulasta  injection).      . LORazepam (ATIVAN) 1 MG tablet Take 1 tablet (1 mg total) by mouth every 8 (eight) hours as needed for anxiety.  60 tablet  0  . Multiple Vitamin (MULTIVITAMIN WITH MINERALS) TABS Take 1 tablet by mouth daily.      . sennosides-docusate sodium (SENOKOT-S) 8.6-50 MG tablet Take 1 tablet by mouth daily.       . vitamin C (ASCORBIC ACID) 500 MG tablet Take 500 mg by mouth daily.      . cephALEXin (KEFLEX) 500 MG capsule Take 1 capsule (500 mg total) by mouth 4 (four) times daily.  40 capsule  3  . ciprofloxacin (CIPRO) 500 MG tablet Take 500 mg by mouth 2 (two) times daily. Take for 10 days. First dose on 06/14/13      . EPINEPHrine (EPIPEN) 0.3 mg/0.3 mL DEVI Inject 0.3 mg into the muscle as needed (for allergic reaction).       [name] letrozole (FEMARA) 2.5 MG tablet Take 1 tablet (2.5 mg total) by mouth daily.  90 tablet  6  . PRESCRIPTION MEDICATION Inject into the vein as directed. Herceptin infusion weekly on Thursdays; Taxotere and Paraplatin q3weeks with Neulasta support.       No current facility-administered medications for this visit.    SURGICAL HISTORY:  Past Surgical History  Procedure Laterality Date  . Brain surgery      removal benign frontal lobe cyst in 1987  . Foot surgery  2013    bilateral  . Gynecologic cryosurgery      cervical cancer, 1998 last remained disease free with recent normal pap  . Diagnostic laparoscopy  2004    lysis adh-uterine ablation  . Breast surgery      bilateral mastopexy  . Breast surgery      removal benign left breast lesion  . Abdominal hysterectomy  2005    TAH/BSO-lysis adh, enometriosis  . Face lift    . Axillary sentinel node biopsy  12/31/2012    Procedure: AXILLARY SENTINEL NODE BIOPSY;  Surgeon: Emelia Loron, MD;  Location: [facility];  Service: General;  Laterality: Bilateral;  bilateral sentinel node  . Total mastectomy  12/31/2012    Procedure: TOTAL MASTECTOMY;  Surgeon: Emelia Loron, MD;   Location: [facility];  Service: General;  Laterality: Bilateral;  bilateral total mastectomies  . Breast reconstruction with placement of tissue expander and flex hd (acellular hydrated dermis)  12/31/2012    Procedure: BREAST RECONSTRUCTION WITH PLACEMENT OF TISSUE EXPANDER AND FLEX HD (ACELLULAR HYDRATED DERMIS);  Surgeon: Wayland Denis, DO;  Location: [facility];  Service: Plastics;  Laterality: Bilateral;  bilateral immediate breast reconstruction with expanders and flex hd   . Portacath placement  01/27/2013    Procedure: INSERTION PORT-A-CATH;  Surgeon: Emelia Loron, MD;  [facility];  Service: General;  Laterality: Right;  . Incision and drainage of wound Left 04/28/2013    Procedure: IRRIGATION AND DEBRIDEMENT LEFT BREAST WOUND, POSSIBLE CLOSURE OF WOUND WITH DRAIN PLACEMENT;  Surgeon: Wayland Denis, DO;  Location: [facility];  Service: Plastics;  Laterality: Left;  [name] Mastectomy  1/14  . Tee without cardioversion N/A 06/04/2013    Procedure: TRANSESOPHAGEAL ECHOCARDIOGRAM (TEE);  Surgeon: Dolores Patty, MD;  Location: [facility];  Service: Cardiovascular;  Laterality: N/A;  . Tissue expander placement Left 06/16/2013    Procedure: LEFT BREAST TISSUE EXPANDER REMOVAL;  Surgeon: Wayland Denis, DO;  Location[facility];  Service: Plastics;  Laterality: Left;    REVIEW OF SYSTEMS:  General: fatigue (+), night sweats (-), fever (+), pain (-) Lymph: palpable nodes (-) HEENT: vision changes (-), mucositis (-), gum bleeding (-), epistaxis (-) Cardiovascular: chest pain (-), palpitations (-) Pulmonary: shortness of breath (-), dyspnea on exertion (-), cough (-), hemoptysis (-) GI:  Early satiety (-), melena (-), dysphagia (-), nausea/vomiting (-), diarrhea (-) GU: dysuria (-), hematuria (-), incontinence (-) Musculoskeletal: joint swelling (-), joint pain (-), back pain (-) Neuro: weakness (-), numbness  (-), headache (-), confusion (-) Skin: Rash (-), lesions (-), dryness (-) Psych: depression (-), suicidal/homicidal ideation (-), feeling of hopelessness (-)  PHYSICAL EXAMINATION:  Blood pressure 111/67, pulse 80, temperature 98.3 F (36.8 C), temperature source Oral, resp. rate 20, height 5\' 5"  (1.651 m), weight 156 lb 8 oz (70.988 kg). Body mass index is 26.04 kg/(m^2). General: Patient is a well appearing female in no acute distress HEENT: PERRLA, sclerae anicteric no conjunctival pallor, MMM Neck: supple, no palpable adenopathy Lungs: clear to auscultation bilaterally, no wheezes, rhonchi, or rales. Right port negative for discharge, tenderness or erythema. Cardiovascular: regular rate rhythm, S1, S2, no murmurs, rubs or gallops Abdomen: Soft, non-tender, non-distended, normoactive bowel sounds, no HSM Extremities: warm and well perfused, no clubbing, cyanosis, or edema Skin: No rashes or lesions Neuro: Non-focal Breasts: She status post bilateral mastectomies with expander is in place. The cosmetic result is symmetrical.  Both axillae are benign. Her left breast is healing well, it is erythematous on the left lower outer quadrant and possibly fluctuant.  Slightly warm, and non-tender.   ECOG PERFORMANCE STATUS: 1 - Symptomatic but completely ambulatory  LABORATORY DATA: Lab Results  Component Value Date   WBC 6.1 07/22/2013   HGB 12.6 07/22/2013   HCT 38.2 07/22/2013   MCV 84.1 07/22/2013   PLT 180 07/22/2013      Chemistry      Component Value Date/Time   NA 140 07/02/2013 0917   NA 137 04/14/2013 0430   K 4.0 07/02/2013 0917   K 3.9 04/14/2013 0430   CL 104 06/10/2013 1312   CL 103 04/14/2013 0430   CO2 27 07/02/2013 0917   CO2 27 04/14/2013 0430   BUN 26.2* 07/02/2013 0917   BUN 13 04/14/2013 0430   CREATININE 0.7 07/02/2013 0917   CREATININE 0.55 04/14/2013 0430      Component Value Date/Time   CALCIUM 9.3 07/02/2013 0917   CALCIUM 8.2* 04/14/2013 0430   ALKPHOS 41 07/02/2013  0917   ALKPHOS 50 04/13/2013 2030   AST 26 07/02/2013 0917   AST 26 04/13/2013 2030   ALT 22 07/02/2013 0917   ALT 36* 04/13/2013 2030   BILITOT 0.39 07/02/2013 0917   BILITOT 0.5 04/13/2013 2030     12/31/2012 ADDITIONAL INFORMATION: 1. CHROMOGENIC IN-SITU HYBRIDIZATION Interpretation: HER2/NEU BY CISH - SHOWS AMPLIFICATION BY CISH ANALYSIS. THE RATIO OF HER2: CEP 17 SIGNALS WAS 6.81 Reference range: Ratio: HER2:CEP17 < 1.8 gene amplification not observed Ratio: HER2:CEP 17 1.8-2.2 - equivocal result  Ratio: HER2:CEP17 > 2.2 - gene amplification observed Pecola Leisure MD Pathologist, Electronic Signature ( Signed 01/11/2013) 1. PROGNOSTIC INDICATORS - ACIS Results IMMUNOHISTOCHEMICAL AND MORPHOMETRIC ANALYSIS BY THE AUTOMATED CELLULAR IMAGING SYSTEM (ACIS) Estrogen Receptor (Negative, <1%): 99%, STRONG STAINING INTENSITY Progesterone Receptor (Negative, <1%): 10%, STRONG STAINING INTENSITY Proliferation Marker Ki67 by M IB-1 (Low<20%): 46% All controls stained appropriately Pecola Leisure MD Pathologist, Electronic Signature ( Signed 01/06/2013) 1 of 4 FINAL for HULL, Kym [facility] [phone]) FINAL DIAGNOSIS Diagnosis 1. Breast, simple mastectomy, Left - INVASIVE DUCTAL CARCINOMA, GRADE II (2.3 CM), SEE COMMENT. - LYMPHOVASCULAR INVASION IDENTIFIED - INVASIVE TUMOR IS 3 CM FROM THE NEAREST MARGIN (DEEP). - HIGH GRADE DUCTAL CARCINOMA IN SITU WITH COMEDO NECROSIS AND CALCIFICATIONS. - SEE TUMOR SYNOPTIC TEMPLATE BELOW. 2. Lymph node, sentinel, biopsy, Left axillary - ONE LYMPH NODE, NEGATIVE FOR TUMOR (0/1). 3. Breast, simple mastectomy, Right - BENIGN BREAST TISSUE, SEE COMMENT. - NEGATIVE FOR ATYPIA OR MALIGNANCY. 4. Lymph node, sentinel, biopsy, Right axillary - ONE LYMPH NODE, NEGATIVE FOR TUMOR (0/1). Microscopic Comment 1. BREAST, INVASIVE TUMOR, WITH LYMPH NODE SAMPLING Specimen, including laterality: Left breast Procedure: Simple mastectomy Grade: II of III Tubule  formation: 3 Nuclear pleomorphism: 2 Mitotic: 1 Tumor size (gross measurement) 2.3 cm Margins: Invasive, distance to closest margin: 3.0 cm In-situ, distance to closest margin: 3.0 cm (deep) If margin positive, focally or broadly: N/A Lymphovascular invasion: Present, extensive Ductal carcinoma in situ: Present Grade: III of III Extensive intraductal component: Absent Lobular neoplasia: Absent Tumor focality: Unifocal Treatment effect: None If present, treatment effect in breast tissue, lymph nodes or both: N/A Extent of tumor: Skin: Grossly negative for tumor Nipple: Grossly negative for tumor Skeletal muscle: Microscopically negative for tumor Lymph nodes: # examined: 1 Lymph nodes with metastasis: 0 Breast prognostic profile: Estrogen receptor: Repeated, previous study shows 100% positivity. [phone]) Progesterone receptor: Repeated, previous study demonstrated 86% positivity ([id number]) Her 2 neu: Pending and will be reported in an addendum. Ki-67: Pending and will be reported in an addendum. Non-neoplastic breast: No significant findings. TNM: pT2, pN0, pMX Comments: None. (CR:kh 01-04-13) 3. The entire 2.0 cm previous biopsy hematoma is submitted for histopathologic review. On review, there is 2 of 4 FINAL for HULL, Jane [facility] [phone]) Microscopic Comment(continued) no in situ or invasive carcinoma identified in any of the slide sections examined. Additional non-neoplastic findings include benign fibrocystic change, pseudoangiomatous stromal hyperplasia, fibroadenomatoid nodules, periductal chronic inflammation with associated stromal fibrosis and microcalcifications in benign ducts and lobules. Pathologic Stage is pTX pN0, PMX. The surgical resection margin(s) of the specimen were inked and microscopically evaluated. [place] [facility] RADIOGRAPHIC STUDIES:  Mr Biopsy/wire Localization  12/25/2012  *RADIOLOGY REPORT*  Clinical Data:  6 mm enhancing mass in the upper right  breast.  The patient has recent diagnosis of left breast DCIS.  MRI GUIDED VACUUM ASSISTED BIOPSY OF THE RIGHT BREAST WITHOUT AND WITH CONTRAST  Comparison: Previous exams.  Technique: Multiplanar, multisequence MR images of the right breast were obtained prior to and following the intravenous administration of 15 ml of Mulithance.  I met with the patient, and we discussed the procedure of MRI guided biopsy, including risks, benefits, and alternatives. Specifically, we discussed the risks of infection, bleeding, tissue injury, clip migration, and inadequate sampling.  Informed, written consent was given.  Using sterile technique, 2% Lidocaine, MRI guidance, and a 9 gauge vacuum assisted device, biopsy was performed of a 6 mm enhancing mass using a lateral to medial approach.  At the conclusion of  the procedure, a tissue marker clip was deployed into the biopsy cavity.  IMPRESSION: MRI guided biopsy of the right breast. No apparent complications.  THREE-DIMENSIONAL MR IMAGE RENDERING ON INDEPENDENT WORKSTATION:  Three-dimensional MR images were rendered by post-processing of the original MR data on an independent workstation.  The three- dimensional MR images were interpreted, and findings were reported in the accompanying complete MRI report for this study.   Original Report Authenticated By: Britta Mccreedy, M.D.    Nm Sentinel Node Inj-no Rpt (breast)  12/31/2012  CLINICAL DATA: right axillary sentinel node biopsy   Sulfur colloid was injected intradermally by the nuclear medicine  technologist for breast cancer sentinel node localization.     Nm Sentinel Node Inj-no Rpt (breast)  12/31/2012  CLINICAL DATA: left axillary sentinel node biopsy   Sulfur colloid was injected intradermally by the nuclear medicine  technologist for breast cancer sentinel node localization.     Mm Digital Diagnostic Unilat R  12/25/2012  *RADIOLOGY REPORT*  Clinical Data:  MRI guided core needle biopsy of a 6 mm nodule of enhancement  in the upper right breast was performed.  DIGITAL DIAGNOSTIC RIGHT MAMMOGRAM  Comparison:  Previous exams.  Findings:  Films are performed following MRI guided biopsy of 6 mm enhancing nodule in the upper central right breast.  A dumbbell shaped biopsy clip is present in the upper right breast just medial to the level of the nipple.  The clip is within a biopsy cavity that contains air and a small air-fluid level.  IMPRESSION: Satisfactory position of biopsy clip in the upper right breast.   Original Report Authenticated By: Britta Mccreedy, M.D.     ASSESSMENT: 57 year old [place] woman  (1) status post bilateral mastectomies in 12/31/2012 showing  (a) on the right, no evidence of malignancy  (b) on the left, a pT2 pN0, stage IIA invasive ductal carcinoma, grade 2, estrogen receptor 100% positive, progesterone receptor 86% positive, with an MIB-1 of 46%, and HER-2 amplification with a CISH ratio of 6.81  (2) starting adjuvant chemotherapy with carboplatin, docetaxel and trastuzumab February13 2014, to consist of 6 cycles. However patient could not tolerate all of her chemotherapy so therefore she requested that we discontinued the last cycle of her treatment which would have been cycle #6. Therefore she has only received 5 of 6 cycles. However patient will continue Herceptin every 3 weeks.  #3 she will also proceed with receiving adjuvant antiestrogen therapy since her tumor was ER positive. We discussed beginning her on letrozole 2.5 mg daily. She understands the risks and benefits and literature was given to her.      PLAN:   #1 Proceed with Herceptin.   #2 patient will return in 3 weeks' time for Herceptin again.  #3 restart letrozole 2.5 mg daily.  #4 she will return in 3 weeks' time for her next Herceptin.  All questions were answered. The patient knows to call the clinic with any problems, questions or concerns. We can certainly see the patient much sooner if necessary  I spent  25 minutes counseling the patient face to face.  The total time spent in the appointment was 30 minutes.  Drue Second, MD Medical/Oncology [facility] [phone] (beeper) [phone] (Office)  07/22/2013, 11:19 AM

## 2013-08-05 ENCOUNTER — Telehealth: Payer: Self-pay | Admitting: *Deleted

## 2013-08-05 NOTE — Telephone Encounter (Signed)
sw pt informed her that kk will be out of the office on 8/21. gv new time slots for labs@11 , ov@11 :30am, and tx to follow all on 8/21. i emailed MW to adjust the tx time...td

## 2013-08-06 ENCOUNTER — Telehealth: Payer: Self-pay | Admitting: *Deleted

## 2013-08-06 NOTE — Telephone Encounter (Signed)
Per staff message I have adjusted 8/21 appt

## 2013-08-12 ENCOUNTER — Other Ambulatory Visit (HOSPITAL_BASED_OUTPATIENT_CLINIC_OR_DEPARTMENT_OTHER): Payer: 59 | Admitting: Lab

## 2013-08-12 ENCOUNTER — Ambulatory Visit (HOSPITAL_BASED_OUTPATIENT_CLINIC_OR_DEPARTMENT_OTHER): Payer: 59 | Admitting: Family

## 2013-08-12 ENCOUNTER — Ambulatory Visit: Payer: 59 | Admitting: Oncology

## 2013-08-12 ENCOUNTER — Other Ambulatory Visit: Payer: 59 | Admitting: Lab

## 2013-08-12 ENCOUNTER — Ambulatory Visit (HOSPITAL_BASED_OUTPATIENT_CLINIC_OR_DEPARTMENT_OTHER): Payer: 59

## 2013-08-12 ENCOUNTER — Encounter: Payer: Self-pay | Admitting: Family

## 2013-08-12 VITALS — BP 106/73 | HR 83 | Temp 98.5°F | Resp 20 | Ht 65.0 in | Wt 156.1 lb

## 2013-08-12 DIAGNOSIS — C50912 Malignant neoplasm of unspecified site of left female breast: Secondary | ICD-10-CM

## 2013-08-12 DIAGNOSIS — C50412 Malignant neoplasm of upper-outer quadrant of left female breast: Secondary | ICD-10-CM

## 2013-08-12 DIAGNOSIS — IMO0001 Reserved for inherently not codable concepts without codable children: Secondary | ICD-10-CM

## 2013-08-12 DIAGNOSIS — Z170 Estrogen receptor positive status [ER+]: Secondary | ICD-10-CM

## 2013-08-12 DIAGNOSIS — C50419 Malignant neoplasm of upper-outer quadrant of unspecified female breast: Secondary | ICD-10-CM

## 2013-08-12 DIAGNOSIS — Z5112 Encounter for antineoplastic immunotherapy: Secondary | ICD-10-CM

## 2013-08-12 DIAGNOSIS — M25559 Pain in unspecified hip: Secondary | ICD-10-CM

## 2013-08-12 LAB — CBC WITH DIFFERENTIAL/PLATELET
BASO%: 0.2 % (ref 0.0–2.0)
Basophils Absolute: 0 10*3/uL (ref 0.0–0.1)
Eosinophils Absolute: 0.1 10*3/uL (ref 0.0–0.5)
HCT: 40.3 % (ref 34.8–46.6)
HGB: 13.5 g/dL (ref 11.6–15.9)
LYMPH%: 39.5 % (ref 14.0–49.7)
MONO#: 0.5 10*3/uL (ref 0.1–0.9)
NEUT#: 3.1 10*3/uL (ref 1.5–6.5)
NEUT%: 51 % (ref 38.4–76.8)
Platelets: 195 10*3/uL (ref 145–400)
WBC: 6.1 10*3/uL (ref 3.9–10.3)
lymph#: 2.4 10*3/uL (ref 0.9–3.3)

## 2013-08-12 LAB — COMPREHENSIVE METABOLIC PANEL (CC13)
AST: 22 U/L (ref 5–34)
Albumin: 3.3 g/dL — ABNORMAL LOW (ref 3.5–5.0)
BUN: 29.2 mg/dL — ABNORMAL HIGH (ref 7.0–26.0)
CO2: 25 mEq/L (ref 22–29)
Calcium: 9.2 mg/dL (ref 8.4–10.4)
Chloride: 106 mEq/L (ref 98–109)
Glucose: 100 mg/dl (ref 70–140)
Potassium: 4 mEq/L (ref 3.5–5.1)

## 2013-08-12 MED ORDER — SODIUM CHLORIDE 0.9 % IJ SOLN
10.0000 mL | INTRAMUSCULAR | Status: DC | PRN
  Administered 2013-08-12: 10 mL
  Filled 2013-08-12: qty 10

## 2013-08-12 MED ORDER — HEPARIN SOD (PORK) LOCK FLUSH 100 UNIT/ML IV SOLN
500.0000 [IU] | Freq: Once | INTRAVENOUS | Status: AC | PRN
  Administered 2013-08-12: 500 [IU]
  Filled 2013-08-12: qty 5

## 2013-08-12 MED ORDER — SODIUM CHLORIDE 0.9 % IV SOLN
Freq: Once | INTRAVENOUS | Status: AC
  Administered 2013-08-12: 13:00:00 via INTRAVENOUS

## 2013-08-12 MED ORDER — TRASTUZUMAB CHEMO INJECTION 440 MG
6.0000 mg/kg | Freq: Once | INTRAVENOUS | Status: AC
  Administered 2013-08-12: 420 mg via INTRAVENOUS
  Filled 2013-08-12: qty 20

## 2013-08-12 MED ORDER — ACETAMINOPHEN 325 MG PO TABS
650.0000 mg | ORAL_TABLET | Freq: Once | ORAL | Status: AC
  Administered 2013-08-12: 650 mg via ORAL

## 2013-08-12 NOTE — Progress Notes (Signed)
[facility]  Telephone:([phone] [phone]  OFFICE PROGRESS NOTE   CC: [name], MD [address] [place] [address] Dr. Emelia Loron Dr. Chipper Herb Dr. Starleen Arms Dr. Dorothy Puffer   DIAGNOSIS: 57 year old female who presented on 12/24/2012 which new diagnosis of invasive ductal carcinoma, ER positive, PR positive, HER-2/neu positive in the left breast.     PRIOR THERAPY: #1  The patient has multiple medical problems but recently she had a mammogram performed that showed a 5.4 cm area of calcifications in the left breast.  MRI of the breasts showed the area in her left breast in the upper outer quadrant extended to about 7.2 cm. Needle core biopsy of the left breast showed a high-grade DCIS that was ER positive PR positive.  #2  She was seen by Dr. Emelia Loron regarding surgical options. The patient wanted to have bilateral mastectomies.  She is now status post bilateral mastectomy on 12/31/2012. The final pathology did reveal a 2.3 cm invasive ductal carcinoma in the left breast. The tumor was ER +100%, PR +86%,  HER-2/neu was amplified with a ratio of 6.81, Ki-67 was elevated at 46%. She is in the process of completing reconstruction.  #3 The patient was seen again in medical oncology for discussion of adjuvant treatment. Since her tumor was HER-2/neu positive, we recommended anti-HER-2/neu- based therapy consisting of Taxotere/Carboplatinum/Herceptin. The Taxotere and Carboplatinum was given every 3 weeks for 5 of 6 cycles. She received Neulasta on day 2. Herceptin was given weekly during the duration of chemotherapy and then every 3 weeks to finish out a one-year of treatment. She received systemic therapy with TCH from 02/04/2013 through 05/13/2013.  #4  The patient will continue Herceptin every 3 weeks to finish out 1 year.  #5  She began antiestrogen therapy with Letrozole 2.5 mg daily starting 06/03/2013 with a total of 5 years  of therapy planned at this time.   CURRENT THERAPY:  Q3 weekly Herceptin therapy.  Antiestrogen therapy with Letrozole at 2.5 mg by mouth daily.  INTERVAL HISTORY:  The patient is here for follow up and adjuvant treatment with Herceptin of her invasive ductal carcinoma. .  The patient continues to have arthralgias/myalgias particularly in her hips since restarting antiestrogen therapy with Letrozole after a 3 week break from the medication.  She otherwise denies any nausea, vomiting, fevers, chills, diarrhea, or constipation.  The remainder of the 10 point review of systems is negative.   MEDICAL HISTORY: Past Medical History  Diagnosis Date  . IBS (irritable bowel syndrome)   . ADD (attention deficit disorder)   . Lyme disease   . Contact lens/glasses fitting     wears contacts or glasses  . Breast cancer 12/04/12    left, ER/PR +  . Allergy   . Asthma   . Thyroid disease   . Hypothyroidism     ALLERGIES:  is allergic to betadine; dilaudid; iodine; penicillins; shellfish-derived products; sulfamethoxazole w-trimethoprim; and cleocin.  MEDICATIONS:  Current Outpatient Prescriptions  Medication Sig Dispense Refill  . albuterol (PROVENTIL HFA;VENTOLIN HFA) 108 (90 BASE) MCG/ACT inhaler Inhale 2 puffs into the lungs every 6 (six) hours as needed for shortness of breath.       [name] aspirin 81 MG tablet Take 81 mg by mouth daily.      . B Complex-C (B-COMPLEX WITH VITAMIN C) tablet Take 1 tablet by mouth daily.      [name] dextroamphetamine (DEXTROSTAT) 5 MG tablet Take 2.5 mg by  mouth 3 (three) times daily.       . eszopiclone (LUNESTA) 2 MG TABS Take 1 tablet (2 mg total) by mouth at bedtime. Take immediately before bedtime  30 tablet  5  . letrozole (FEMARA) 2.5 MG tablet Take 1 tablet (2.5 mg total) by mouth daily.  90 tablet  6  . levothyroxine (SYNTHROID, LEVOTHROID) 100 MCG tablet Take 100 mcg by mouth daily.      [name] lidocaine-prilocaine (EMLA) cream Apply 1 application topically as  needed (for port-a-cath access).  30 g  2  . loratadine (CLARITIN) 10 MG tablet Take 10 mg by mouth daily as needed (for 5 days after Neulasta injection).      . LORazepam (ATIVAN) 1 MG tablet Take 1 tablet (1 mg total) by mouth every 8 (eight) hours as needed for anxiety.  60 tablet  0  . Multiple Vitamin (MULTIVITAMIN WITH MINERALS) TABS Take 1 tablet by mouth daily.      . vitamin C (ASCORBIC ACID) 500 MG tablet Take 500 mg by mouth daily.      . cephALEXin (KEFLEX) 500 MG capsule Take 1 capsule (500 mg total) by mouth 4 (four) times daily.  40 capsule  3  . ciprofloxacin (CIPRO) 500 MG tablet Take 500 mg by mouth 2 (two) times daily. Take for 10 days. First dose on 06/14/13      . EPINEPHrine (EPIPEN) 0.3 mg/0.3 mL DEVI Inject 0.3 mg into the muscle as needed (for allergic reaction).       [name] PRESCRIPTION MEDICATION Inject into the vein as directed. Herceptin infusion weekly on Thursdays; Taxotere and Paraplatin q3weeks with Neulasta support.      . sennosides-docusate sodium (SENOKOT-S) 8.6-50 MG tablet Take 1 tablet by mouth daily.        No current facility-administered medications for this visit.    SURGICAL HISTORY:  Past Surgical History  Procedure Laterality Date  . Brain surgery      removal benign frontal lobe cyst in 1987  . Foot surgery  2013    bilateral  . Gynecologic cryosurgery      cervical cancer, 1998 last remained disease free with recent normal pap  . Diagnostic laparoscopy  2004    lysis adh-uterine ablation  . Breast surgery      bilateral mastopexy  . Breast surgery      removal benign left breast lesion  . Abdominal hysterectomy  2005    TAH/BSO-lysis adh, enometriosis  . Face lift    . Axillary sentinel node biopsy  12/31/2012    Procedure: AXILLARY SENTINEL NODE BIOPSY;  Surgeon: Emelia Loron, MD;  Location: [facility];  Service: General;  Laterality: Bilateral;  bilateral sentinel node  . Total mastectomy  12/31/2012    Procedure: TOTAL  MASTECTOMY;  Surgeon: Emelia Loron, MD;  Location: [facility];  Service: General;  Laterality: Bilateral;  bilateral total mastectomies  . Breast reconstruction with placement of tissue expander and flex hd (acellular hydrated dermis)  12/31/2012    Procedure: BREAST RECONSTRUCTION WITH PLACEMENT OF TISSUE EXPANDER AND FLEX HD (ACELLULAR HYDRATED DERMIS);  Surgeon: Wayland Denis, DO;  Location: [facility];  Service: Plastics;  Laterality: Bilateral;  bilateral immediate breast reconstruction with expanders and flex hd   . Portacath placement  01/27/2013    Procedure: INSERTION PORT-A-CATH;  Surgeon: Emelia Loron, MD;  Location: [facility];  Service: General;  Laterality: Right;  . Incision and drainage of wound Left 04/28/2013  Procedure: IRRIGATION AND DEBRIDEMENT LEFT BREAST WOUND, POSSIBLE CLOSURE OF WOUND WITH DRAIN PLACEMENT;  Surgeon: Wayland Denis, DO;  Location: [facility];  Service: Plastics;  Laterality: Left;  [name] Mastectomy  1/14  . Tee without cardioversion N/A 06/04/2013    Procedure: TRANSESOPHAGEAL ECHOCARDIOGRAM (TEE);  Surgeon: Dolores Patty, MD;  Location: [facility];  Service: Cardiovascular;  Laterality: N/A;  . Tissue expander placement Left 06/16/2013    Procedure: LEFT BREAST TISSUE EXPANDER REMOVAL;  Surgeon: Wayland Denis, DO;  Location: [facility];  Service: Plastics;  Laterality: Left;    REVIEW OF SYSTEMS:  A 10 point review of systems was completed and is negative except for ongoing arthralgias/myalgias particular in her hips bilaterally.  The review of systems is otherwise noncontributory.   PHYSICAL EXAMINATION: Blood pressure 106/73, pulse 83, temperature 98.5 F (36.9 C), temperature source Oral, resp. rate 20, height 5\' 5"  (1.651 m), weight 156 lb 1.6 oz (70.806 kg). Body mass index is 25.98 kg/(m^2).  General appearance: Alert, cooperative, well nourished, no apparent  distress Head: Normocephalic, without obvious abnormality, atraumatic, the patient is wearing a wig Eyes: Conjunctivae/corneas clear, PERRLA, EOMI Nose: Nares, septum and mucosa are normal, no drainage or sinus tenderness Neck: No adenopathy, supple, symmetrical, trachea midline,  no tenderness Resp: Clear to auscultation bilaterally Cardio: Regular rate and rhythm, S1, S2 normal, no murmur, click, rub or gallop, right chest Port-A-Cath is covered in EMLA cream Breasts:  Deferred GI: Soft, slightly distended, non-tender, hypoactive bowel sounds, no organomegaly Extremities: Extremities normal, atraumatic, no cyanosis or edema Lymph nodes: Cervical, supraclavicular, and axillary nodes normal Neurologic: Grossly normal  ECOG PERFORMANCE STATUS: 1 - Symptomatic but completely ambulatory   LABORATORY DATA: Lab Results  Component Value Date   WBC 6.1 08/12/2013   HGB 13.5 08/12/2013   HCT 40.3 08/12/2013   MCV 81.6 08/12/2013   PLT 195 08/12/2013      Chemistry      Component Value Date/Time   NA 142 08/12/2013 1054   NA 137 04/14/2013 0430   K 4.0 08/12/2013 1054   K 3.9 04/14/2013 0430   CL 104 06/10/2013 1312   CL 103 04/14/2013 0430   CO2 25 08/12/2013 1054   CO2 27 04/14/2013 0430   BUN 29.2* 08/12/2013 1054   BUN 13 04/14/2013 0430   CREATININE 0.7 08/12/2013 1054   CREATININE 0.55 04/14/2013 0430      Component Value Date/Time   CALCIUM 9.2 08/12/2013 1054   CALCIUM 8.2* 04/14/2013 0430   ALKPHOS 51 08/12/2013 1054   ALKPHOS 50 04/13/2013 2030   AST 22 08/12/2013 1054   AST 26 04/13/2013 2030   ALT 26 08/12/2013 1054   ALT 36* 04/13/2013 2030   BILITOT 0.32 08/12/2013 1054   BILITOT 0.5 04/13/2013 2030      STUDIES: No results found.   ASSESSMENT: 57 year old [place], [place] woman:  (1) Status post bilateral mastectomies on 12/31/2012 showing:  (a) On the right, no evidence of malignancy  (b) On the left, a pT2 pN0, stage IIA invasive ductal carcinoma, grade 2,  estrogen receptor 100% positive, progesterone receptor 86% positive, with an MIB-1 of 46%, and HER-2 amplification with a CISH ratio of 6.81.  (2) Received adjuvant chemotherapy with Carboplatin/Docetaxel/Trastuzumab from 02/04/2013 through 05/13/2013 and was scheduled to consist of 6 cycles.  Unfortunately, the patient was unable to tolerate the entire course of chemotherapy and subsequently she requested that we discontinued the last cycle of her treatment which would  have been cycle #6.  She received a total of 5 of 6 chemotherapy cycles.  She continues to receive every 3 week Trastuzumab (Herceptin).   (3) Currently taking adjuvant antiestrogen therapy with letters saw 2.5 mg by mouth daily since her tumor was ER positive.   (4) Ongoing arthralgias/myalgias   PLAN:   #1 Proceed with every 3 week Herceptin therapy today.  #2  The patient will return in 3 weeks' time (09/02/2013) for Herceptin therapy.  We will check laboratories of CBC and CMP at that time  #3 Continue antiestrogen therapy with Letrozole 2.5 mg by mouth daily.  The patient is hardly taking a B complex and a multivitamin for arthralgias/myalgias.  Over-the-counter magnesium oxide 400 mg by mouth daily was also suggested.  #4 Her next 2-D echocardiogram is scheduled for 09/09/2013  #5 She is scheduled to complete latissimus dorsi flap breast reconstruction surgery on 09/29/2013.  All questions were answered.  Ms. Rennie Plowman knows to call the clinic with any problems, questions or concerns. We can certainly see her  much sooner if necessary.   Larina Bras, NP-C 08/13/2013  7:21 PM

## 2013-08-12 NOTE — Patient Instructions (Addendum)
Please contact us at [phone] if you have any questions or concerns.  Please continue to do well and enjoy life!!!  Get plenty of rest, drink plenty of water, exercise daily (walking), eat a balanced diet.  Results for orders placed in visit on 08/12/13 (from the past 24 hour(s))  CBC WITH DIFFERENTIAL     Status: None   Collection Time    08/12/13 10:54 AM      Result Value Range   WBC 6.1  3.9 - 10.3 10e3/uL   NEUT# 3.1  1.5 - 6.5 10e3/uL   HGB 13.5  11.6 - 15.9 g/dL   HCT 16.1  09.6 - 04.5 %   Platelets 195  145 - 400 10e3/uL   MCV 81.6  79.5 - 101.0 fL   MCH 27.3  25.1 - 34.0 pg   MCHC 33.5  31.5 - 36.0 g/dL   RBC 4.09  8.11 - 9.14 10e6/uL   RDW 14.0  11.2 - 14.5 %   lymph# 2.4  0.9 - 3.3 10e3/uL   MONO# 0.5  0.1 - 0.9 10e3/uL   Eosinophils Absolute 0.1  0.0 - 0.5 10e3/uL   Basophils Absolute 0.0  0.0 - 0.1 10e3/uL   NEUT% 51.0  38.4 - 76.8 %   LYMPH% 39.5  14.0 - 49.7 %   MONO% 7.7  0.0 - 14.0 %   EOS% 1.6  0.0 - 7.0 %   BASO% 0.2  0.0 - 2.0 %   nRBC 0  0 - 0 %   Narrative:    Performed At:  [facility]               [address]               [place], [place] [address]

## 2013-08-13 ENCOUNTER — Encounter: Payer: Self-pay | Admitting: Family

## 2013-09-02 ENCOUNTER — Telehealth: Payer: Self-pay | Admitting: *Deleted

## 2013-09-02 ENCOUNTER — Ambulatory Visit (HOSPITAL_BASED_OUTPATIENT_CLINIC_OR_DEPARTMENT_OTHER): Payer: 59 | Admitting: Oncology

## 2013-09-02 ENCOUNTER — Ambulatory Visit (HOSPITAL_BASED_OUTPATIENT_CLINIC_OR_DEPARTMENT_OTHER): Payer: 59

## 2013-09-02 ENCOUNTER — Encounter: Payer: Self-pay | Admitting: Oncology

## 2013-09-02 ENCOUNTER — Other Ambulatory Visit (HOSPITAL_COMMUNITY): Payer: 59

## 2013-09-02 ENCOUNTER — Other Ambulatory Visit (HOSPITAL_BASED_OUTPATIENT_CLINIC_OR_DEPARTMENT_OTHER): Payer: 59 | Admitting: Lab

## 2013-09-02 VITALS — BP 110/75 | HR 70 | Temp 98.5°F | Resp 20 | Ht 65.0 in | Wt 157.2 lb

## 2013-09-02 DIAGNOSIS — Z170 Estrogen receptor positive status [ER+]: Secondary | ICD-10-CM

## 2013-09-02 DIAGNOSIS — C50419 Malignant neoplasm of upper-outer quadrant of unspecified female breast: Secondary | ICD-10-CM

## 2013-09-02 DIAGNOSIS — IMO0001 Reserved for inherently not codable concepts without codable children: Secondary | ICD-10-CM

## 2013-09-02 DIAGNOSIS — D0512 Intraductal carcinoma in situ of left breast: Secondary | ICD-10-CM

## 2013-09-02 DIAGNOSIS — Z23 Encounter for immunization: Secondary | ICD-10-CM

## 2013-09-02 DIAGNOSIS — Z5112 Encounter for antineoplastic immunotherapy: Secondary | ICD-10-CM

## 2013-09-02 LAB — COMPREHENSIVE METABOLIC PANEL (CC13)
ALT: 21 U/L (ref 0–55)
CO2: 27 mEq/L (ref 22–29)
Chloride: 105 mEq/L (ref 98–109)
Sodium: 140 mEq/L (ref 136–145)
Total Bilirubin: 0.32 mg/dL (ref 0.20–1.20)
Total Protein: 7 g/dL (ref 6.4–8.3)

## 2013-09-02 LAB — CBC WITH DIFFERENTIAL/PLATELET
BASO%: 0.4 % (ref 0.0–2.0)
LYMPH%: 43 % (ref 14.0–49.7)
MCHC: 33.6 g/dL (ref 31.5–36.0)
MONO#: 0.4 10*3/uL (ref 0.1–0.9)
RBC: 4.94 10*6/uL (ref 3.70–5.45)
WBC: 5.2 10*3/uL (ref 3.9–10.3)
lymph#: 2.2 10*3/uL (ref 0.9–3.3)

## 2013-09-02 MED ORDER — HEPARIN SOD (PORK) LOCK FLUSH 100 UNIT/ML IV SOLN
500.0000 [IU] | Freq: Once | INTRAVENOUS | Status: AC | PRN
  Administered 2013-09-02: 500 [IU]
  Filled 2013-09-02: qty 5

## 2013-09-02 MED ORDER — ACETAMINOPHEN 325 MG PO TABS
ORAL_TABLET | ORAL | Status: AC
  Filled 2013-09-02: qty 2

## 2013-09-02 MED ORDER — ACETAMINOPHEN 325 MG PO TABS
650.0000 mg | ORAL_TABLET | Freq: Once | ORAL | Status: AC
  Administered 2013-09-02: 650 mg via ORAL

## 2013-09-02 MED ORDER — INFLUENZA VAC SPLIT QUAD 0.5 ML IM SUSP
0.5000 mL | Freq: Once | INTRAMUSCULAR | Status: AC
  Administered 2013-09-02: 0.5 mL via INTRAMUSCULAR
  Filled 2013-09-02: qty 0.5

## 2013-09-02 MED ORDER — SODIUM CHLORIDE 0.9 % IJ SOLN
10.0000 mL | INTRAMUSCULAR | Status: DC | PRN
  Administered 2013-09-02: 10 mL
  Filled 2013-09-02: qty 10

## 2013-09-02 MED ORDER — TRASTUZUMAB CHEMO INJECTION 440 MG
6.0000 mg/kg | Freq: Once | INTRAVENOUS | Status: AC
  Administered 2013-09-02: 420 mg via INTRAVENOUS
  Filled 2013-09-02: qty 20

## 2013-09-02 MED ORDER — SODIUM CHLORIDE 0.9 % IV SOLN
Freq: Once | INTRAVENOUS | Status: AC
  Administered 2013-09-02: 13:00:00 via INTRAVENOUS

## 2013-09-02 NOTE — Patient Instructions (Addendum)
[facility] Discharge Instructions for Patients Receiving Chemotherapy  Today you received the following chemotherapy agents:  Herceptin  To help prevent nausea and vomiting after your treatment, we encourage you to take your nausea medication as ordered per MD.   If you develop nausea and vomiting that is not controlled by your nausea medication, call the clinic.   BELOW ARE SYMPTOMS THAT SHOULD BE REPORTED IMMEDIATELY:  *FEVER GREATER THAN 100.5 F  *CHILLS WITH OR WITHOUT FEVER  NAUSEA AND VOMITING THAT IS NOT CONTROLLED WITH YOUR NAUSEA MEDICATION  *UNUSUAL SHORTNESS OF BREATH  *UNUSUAL BRUISING OR BLEEDING  TENDERNESS IN MOUTH AND THROAT WITH OR WITHOUT PRESENCE OF ULCERS  *URINARY PROBLEMS  *BOWEL PROBLEMS  UNUSUAL RASH Items with * indicate a potential emergency and should be followed up as soon as possible.  Feel free to call the clinic you have any questions or concerns. The clinic phone number is [phone].   Fluconazole injection What is this medicine? FLUCONAZOLE (floo KON na zole) is an antifungal medicine. It is used to treat or prevent certain kinds of fungal or yeast infections. This medicine may be used for other purposes; ask your health care provider or pharmacist if you have questions. What should I tell my health care provider before I take this medicine? They need to know if you have any of these conditions: -history of irregular heart beat -kidney disease -an unusual or allergic reaction to fluconazole, other antifungal medicines, foods, dyes or preservatives -pregnant or trying to get pregnant -breast-feeding How should I use this medicine? This medicine is for injection into a vein. It is usually given by a health care professional in a hospital or clinic setting. If you get this medicine at home, you will be taught how to prepare and give this medicine. Use exactly as directed. Take your medicine at regular intervals. Do not  take your medicine more often than directed. It is important that you put your used needles and syringes in a special sharps container. Do not put them in a trash can. If you do not have a sharps container, call your pharmacist or healthcare provider to get one. Talk to your pediatrician regarding the use of this medicine in children. Special care may be needed. Overdosage: If you think you have taken too much of this medicine contact a poison control center or emergency room at once. NOTE: This medicine is only for you. Do not share this medicine with others. What if I miss a dose? This does not apply. What may interact with this medicine? Do not take this medicine with any of the following medications: -cisapride -pimozide -red yeast rice This medicine may also interact with the following medications: -birth control pills -cyclosporine -diuretics like hydrochlorothiazide -medicines for diabetes that are taken by mouth -medicines for high cholesterol like atorvastatin, lovastatin or simvastatin -phenytoin -ramelteon -rifabutin -rifampin -some medicines for anxiety or sleep -tacrolimus -terfenadine -theophylline -warfarin This list may not describe all possible interactions. Give your health care provider a list of all the medicines, herbs, non-prescription drugs, or dietary supplements you use. Also tell them if you smoke, drink alcohol, or use illegal drugs. Some items may interact with your medicine. What should I watch for while using this medicine? Tell your doctor if your symptoms do not improve. If you are taking this medicine for a long time you may need blood work. Some fungal infections need many weeks or months of treatment to cure completely. Alcohol can increase possible  damage to your liver from this medicine. Avoid alcoholic drinks. What side effects may I notice from receiving this medicine? Side effects that you should report to your doctor or health care professional  as soon as possible: -allergic reactions like skin rash or itching, hives, swelling of the lips, mouth, tongue, or throat -dark urine -feeling dizzy or faint -irregular heartbeat or chest pain -pain, redness at site of injection -redness, blistering, peeling or loosening of the skin, including inside the mouth -stomach pain -trouble breathing -unusual bruising or bleeding -vomiting -yellowing of the eyes or skin Side effects that usually do not require medical attention (report to your doctor or health care professional if they continue or are bothersome): -changes in how food tastes -diarrhea -headache -stomach upset, nausea This list may not describe all possible side effects. Call your doctor for medical advice about side effects. You may report side effects to FDA at 1-800-FDA-1088. Where should I keep my medicine? Keep out of the reach of children. If you are using this medicine at home, you will be instructed on how to store this medicine. Throw away any unused medicine after the expiration date on the label. NOTE: This sheet is a summary. It may not cover all possible information. If you have questions about this medicine, talk to your doctor, pharmacist, or health care provider.  2013, [employer] Standard. (04/13/2008 1:30:30 PM)

## 2013-09-02 NOTE — Telephone Encounter (Signed)
appts made and printed. Pt is aware that tx will follow on 10/23 and 11/13. i emailed MW....td

## 2013-09-02 NOTE — Telephone Encounter (Signed)
Per staff message and POF I have scheduled appts.  JMW  

## 2013-09-09 ENCOUNTER — Ambulatory Visit (HOSPITAL_BASED_OUTPATIENT_CLINIC_OR_DEPARTMENT_OTHER)
Admission: RE | Admit: 2013-09-09 | Discharge: 2013-09-09 | Disposition: A | Discharge status: Home / Self Care | Payer: 59 | Source: Ambulatory Visit | Attending: Endocrinology | Admitting: Endocrinology

## 2013-09-09 ENCOUNTER — Ambulatory Visit (HOSPITAL_COMMUNITY)
Admission: RE | Admit: 2013-09-09 | Discharge: 2013-09-09 | Disposition: A | Discharge status: Home / Self Care | Payer: 59 | Source: Ambulatory Visit | Attending: Cardiology | Admitting: Cardiology

## 2013-09-09 VITALS — BP 114/82 | HR 81 | Wt 159.4 lb

## 2013-09-09 DIAGNOSIS — C50919 Malignant neoplasm of unspecified site of unspecified female breast: Secondary | ICD-10-CM

## 2013-09-09 DIAGNOSIS — I059 Rheumatic mitral valve disease, unspecified: Secondary | ICD-10-CM | POA: Insufficient documentation

## 2013-09-09 DIAGNOSIS — Z01810 Encounter for preprocedural cardiovascular examination: Secondary | ICD-10-CM | POA: Insufficient documentation

## 2013-09-09 DIAGNOSIS — C50912 Malignant neoplasm of unspecified site of left female breast: Secondary | ICD-10-CM

## 2013-09-09 DIAGNOSIS — C50419 Malignant neoplasm of upper-outer quadrant of unspecified female breast: Secondary | ICD-10-CM

## 2013-09-09 DIAGNOSIS — I079 Rheumatic tricuspid valve disease, unspecified: Secondary | ICD-10-CM | POA: Insufficient documentation

## 2013-09-09 DIAGNOSIS — Z09 Encounter for follow-up examination after completed treatment for conditions other than malignant neoplasm: Secondary | ICD-10-CM

## 2013-09-09 NOTE — Patient Instructions (Addendum)
Follow up in 3 months

## 2013-09-09 NOTE — Progress Notes (Signed)
  Echocardiogram 2D Echocardiogram has been performed.  Cathie Beams 09/09/2013, 3:33 PM

## 2013-09-09 NOTE — Progress Notes (Signed)
Patient ID: Jane Gutierrez, female   DOB: Mar 22, 1956, 57 y.o.   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 ER +100% PR +86% HER-2/neu (Sentinel nodes were negative for metastatic disease).  She is s/p bilateral mastectomies with reconstruction.    Plan for adjuvant chemotherapy and Herceptin.  On 02/04/13 she started Taxotere/carboplatinum to be given every 3 weeks for a total of 6 cycles. She will also receive Herceptin weekly for the duration of chemotherapy and then every 3 weeks to finish out one year. Per Dr Welton Flakes she will complete in April 2015. .   Admitted to [facility] 04/13/13 with sepsis due strep throat and chest wound infection. Wound was revised in May. Had drain in x 3 weeks.   Echos: 01/26/13 EF 60% lateral S' 10.9 Grade II diastolic dysfunction 03/25/13 EF 60-65% lat s' 12.8  RV normal 05/27/13 EF lat S' 12.5 RV mildly dilated. Normal function. +RAE. IVC dilated 2.8 cm. Mod TR  RVSP 35-40.  06/04/13 TEE EF 55-60% mild-mod TR 09/09/13 EF 55-60% lateral s' 12.1 RV ok. Mild TR RVSP 25-30  VQ 05/27/13 Negative for PE  She returns for routine follow up today.  She has finished chemo now just on Herceptin  Denies SOB/PND/Orthopnea. Started exercising again. Exercise bike 30 mins day + weights and ski machine. She is working 4-6 hours per day. Plan for breast reconstruction in 09/29/13.  Has severe iodine reaction    Review of Systems: All pertinent positives and negatives as in HPI, otherwise negative.   Past Medical History  Diagnosis Date  . IBS (irritable bowel syndrome)   . ADD (attention deficit disorder)   . Lyme disease   . Contact lens/glasses fitting     wears contacts or glasses  . Breast cancer 12/04/12    left, ER/PR +  . Allergy   . Asthma   . Thyroid disease   . Hypothyroidism     Current Outpatient Prescriptions   Medication Sig Dispense Refill  . albuterol (PROVENTIL HFA;VENTOLIN HFA) 108 (90 BASE) MCG/ACT inhaler Inhale 2 puffs into the lungs every 6 (six) hours as needed for shortness of breath.       [name] aspirin 81 MG tablet Take 81 mg by mouth daily.      . B Complex-C (B-COMPLEX WITH VITAMIN C) tablet Take 1 tablet by mouth daily.      [name] dextroamphetamine (DEXTROSTAT) 5 MG tablet Take 2.5 mg by mouth 3 (three) times daily.       [name] EPINEPHrine (EPIPEN) 0.3 mg/0.3 mL DEVI Inject 0.3 mg into the muscle as needed (for allergic reaction).       . eszopiclone (LUNESTA) 2 MG TABS Take 1 tablet (2 mg total) by mouth at bedtime. Take immediately before bedtime  30 tablet  5  . letrozole (FEMARA) 2.5 MG tablet Take 1 tablet (2.5 mg total) by mouth daily.  90 tablet  6  . levothyroxine (SYNTHROID, LEVOTHROID) 100 MCG tablet Take 100 mcg by mouth daily.      [name] lidocaine-prilocaine (EMLA) cream Apply 1 application topically as needed (for port-a-cath access).  30 g  2  . loratadine (CLARITIN) 10 MG tablet Take 10 mg by mouth daily as needed (for 5 days after Neulasta injection).      . LORazepam (ATIVAN) 1 MG tablet Take 1 tablet (1 mg  total) by mouth every 8 (eight) hours as needed for anxiety.  60 tablet  0  . Multiple Vitamin (MULTIVITAMIN WITH MINERALS) TABS Take 1 tablet by mouth daily.      . vitamin C (ASCORBIC ACID) 500 MG tablet Take 500 mg by mouth daily.      . sennosides-docusate sodium (SENOKOT-S) 8.6-50 MG tablet Take 1 tablet by mouth daily.        No current facility-administered medications for this encounter.     Allergies  Allergen Reactions  . Betadine [Povidone Iodine]   . Dilaudid [Hydromorphone Hcl] Nausea And Vomiting  . Iodine     REACTION: anaphylactic  . Penicillins   . Shellfish-Derived Products   . Sulfamethoxazole W-Trimethoprim   . Cleocin [Clindamycin Hcl] Rash     PHYSICAL EXAM: Filed Vitals:   09/09/13 1538  BP: 114/82  Pulse: 81   General:  Well appearing. No  respiratory difficulty HEENT: normal Neck: supple.  JVP flat Carotids 2+ bilat; no bruits. No lymphadenopathy or thryomegaly appreciated. Cor: PMI nondisplaced. Regular rate & rhythm. No rubs, gallops  Lungs: clear Abdomen: soft, nontender, nondistended. No hepatosplenomegaly. No bruits or masses. Good bowel sounds. Extremities: no cyanosis, clubbing, rash, no edema. No cords Neuro: alert & oriented x 3, cranial nerves grossly intact. moves all 4 extremities w/o difficulty. Affect pleasant.   ASSESSMENT & PLAN: 1. L Breast Cancer- S/P bilateral mastectomies. Receiving herceptin every 3 weeks.  Dr Gala Romney reviewed and discussed ECHO. EF and lateral s' stable. Ok to move forward with breast reconstruction 09/29/13. Follow up in 3 months with repeat ECHO  2. Tricuspid regurgitation  3. Pre-op cardiac exam  Follow up in 3 months with an ECHO   Gutierrez,Jane 3:56 PM  Patient seen and examined with Jane Becket, NP. We discussed all aspects of the encounter. I agree with the assessment and plan as stated above. I reviewed echos personally. EF and Doppler parameters stable. RV and TR look better. No HF on exam. Continue Herceptin.   Given excellent functional capacity she is at low risk for peri-op cardiac complications and can proceed to surgery without further cardiac testing.   Jane Bensimhon,MD 4:06 PM

## 2013-09-12 NOTE — Progress Notes (Signed)
[facility]  Telephone:([phone] [phone]  OFFICE PROGRESS NOTE   CC: [name], MD [address] [place] [address] Dr. Emelia Loron Dr. Chipper Herb Dr. Starleen Arms Dr. Dorothy Puffer   DIAGNOSIS: 57 year old female who presented on 12/24/2012 which new diagnosis of invasive ductal carcinoma, ER positive, PR positive, HER-2/neu positive in the left breast.     PRIOR THERAPY: #1  The patient has multiple medical problems but recently she had a mammogram performed that showed a 5.4 cm area of calcifications in the left breast.  MRI of the breasts showed the area in her left breast in the upper outer quadrant extended to about 7.2 cm. Needle core biopsy of the left breast showed a high-grade DCIS that was ER positive PR positive.  #2  She was seen by Dr. Emelia Loron regarding surgical options. The patient wanted to have bilateral mastectomies.  She is now status post bilateral mastectomy on 12/31/2012. The final pathology did reveal a 2.3 cm invasive ductal carcinoma in the left breast. The tumor was ER +100%, PR +86%,  HER-2/neu was amplified with a ratio of 6.81, Ki-67 was elevated at 46%. She is in the process of completing reconstruction.  #3 The patient was seen again in medical oncology for discussion of adjuvant treatment. Since her tumor was HER-2/neu positive, we recommended anti-HER-2/neu- based therapy consisting of Taxotere/Carboplatinum/Herceptin. The Taxotere and Carboplatinum was given every 3 weeks for 5 of 6 cycles. She received Neulasta on day 2. Herceptin was given weekly during the duration of chemotherapy and then every 3 weeks to finish out a one-year of treatment. She received systemic therapy with TCH from 02/04/2013 through 05/13/2013.  #4  The patient will continue Herceptin every 3 weeks to finish out 1 year.  #5  She began antiestrogen therapy with Letrozole 2.5 mg daily starting 06/03/2013 with a total of 5 years  of therapy planned at this time.   CURRENT THERAPY:  Q3 weekly Herceptin therapy.  Antiestrogen therapy with Letrozole at 2.5 mg by mouth daily.  INTERVAL HISTORY:  The patient is here for follow up and adjuvant treatment with Herceptin of her invasive ductal carcinoma. .  The patient continues to have arthralgias/myalgias particularly in her hips since restarting antiestrogen therapy with Letrozole after a 3 week break from the medication.  She otherwise denies any nausea, vomiting, fevers, chills, diarrhea, or constipation.  The remainder of the 10 point review of systems is negative.   MEDICAL HISTORY: Past Medical History  Diagnosis Date  . IBS (irritable bowel syndrome)   . ADD (attention deficit disorder)   . Lyme disease   . Contact lens/glasses fitting     wears contacts or glasses  . Breast cancer 12/04/12    left, ER/PR +  . Allergy   . Asthma   . Thyroid disease   . Hypothyroidism     ALLERGIES:  is allergic to betadine; dilaudid; iodine; penicillins; shellfish-derived products; sulfamethoxazole w-trimethoprim; and cleocin.  MEDICATIONS:  Current Outpatient Prescriptions  Medication Sig Dispense Refill  . albuterol (PROVENTIL HFA;VENTOLIN HFA) 108 (90 BASE) MCG/ACT inhaler Inhale 2 puffs into the lungs every 6 (six) hours as needed for shortness of breath.       [name] aspirin 81 MG tablet Take 81 mg by mouth daily.      . B Complex-C (B-COMPLEX WITH VITAMIN C) tablet Take 1 tablet by mouth daily.      [name] dextroamphetamine (DEXTROSTAT) 5 MG tablet Take 2.5 mg by  mouth 3 (three) times daily.       . eszopiclone (LUNESTA) 2 MG TABS Take 1 tablet (2 mg total) by mouth at bedtime. Take immediately before bedtime  30 tablet  5  . letrozole (FEMARA) 2.5 MG tablet Take 1 tablet (2.5 mg total) by mouth daily.  90 tablet  6  . levothyroxine (SYNTHROID, LEVOTHROID) 100 MCG tablet Take 100 mcg by mouth daily.      [name] lidocaine-prilocaine (EMLA) cream Apply 1 application topically as  needed (for port-a-cath access).  30 g  2  . loratadine (CLARITIN) 10 MG tablet Take 10 mg by mouth daily as needed (for 5 days after Neulasta injection).      . LORazepam (ATIVAN) 1 MG tablet Take 1 tablet (1 mg total) by mouth every 8 (eight) hours as needed for anxiety.  60 tablet  0  . Multiple Vitamin (MULTIVITAMIN WITH MINERALS) TABS Take 1 tablet by mouth daily.      . vitamin C (ASCORBIC ACID) 500 MG tablet Take 500 mg by mouth daily.      [name] EPINEPHrine (EPIPEN) 0.3 mg/0.3 mL DEVI Inject 0.3 mg into the muscle as needed (for allergic reaction).       . sennosides-docusate sodium (SENOKOT-S) 8.6-50 MG tablet Take 1 tablet by mouth daily.        No current facility-administered medications for this visit.    SURGICAL HISTORY:  Past Surgical History  Procedure Laterality Date  . Brain surgery      removal benign frontal lobe cyst in 1987  . Foot surgery  2013    bilateral  . Gynecologic cryosurgery      cervical cancer, 1998 last remained disease free with recent normal pap  . Diagnostic laparoscopy  2004    lysis adh-uterine ablation  . Breast surgery      bilateral mastopexy  . Breast surgery      removal benign left breast lesion  . Abdominal hysterectomy  2005    TAH/BSO-lysis adh, enometriosis  . Face lift    . Axillary sentinel node biopsy  12/31/2012    Procedure: AXILLARY SENTINEL NODE BIOPSY;  Surgeon: Emelia Loron, MD;  Location: [facility];  Service: General;  Laterality: Bilateral;  bilateral sentinel node  . Total mastectomy  12/31/2012    Procedure: TOTAL MASTECTOMY;  Surgeon: Emelia Loron, MD;  Location: [facility];  Service: General;  Laterality: Bilateral;  bilateral total mastectomies  . Breast reconstruction with placement of tissue expander and flex hd (acellular hydrated dermis)  12/31/2012    Procedure: BREAST RECONSTRUCTION WITH PLACEMENT OF TISSUE EXPANDER AND FLEX HD (ACELLULAR HYDRATED DERMIS);  Surgeon: Wayland Denis,  DO;  Location: [facility];  Service: Plastics;  Laterality: Bilateral;  bilateral immediate breast reconstruction with expanders and flex hd   . Portacath placement  01/27/2013    Procedure: INSERTION PORT-A-CATH;  Surgeon: Emelia Loron, MD;  Location: [facility];  Service: General;  Laterality: Right;  . Incision and drainage of wound Left 04/28/2013    Procedure: IRRIGATION AND DEBRIDEMENT LEFT BREAST WOUND, POSSIBLE CLOSURE OF WOUND WITH DRAIN PLACEMENT;  Surgeon: Wayland Denis, DO;  Location: [facility];  Service: Plastics;  Laterality: Left;  [name] Mastectomy  1/14  . Tee without cardioversion N/A 06/04/2013    Procedure: TRANSESOPHAGEAL ECHOCARDIOGRAM (TEE);  Surgeon: Dolores Patty, MD;  Location: [facility];  Service: Cardiovascular;  Laterality: N/A;  . Tissue expander placement Left 06/16/2013  Procedure: LEFT BREAST TISSUE EXPANDER REMOVAL;  Surgeon: Wayland Denis, DO;  Location: [facility];  Service: Plastics;  Laterality: Left;    REVIEW OF SYSTEMS:  A 10 point review of systems was completed and is negative except for ongoing arthralgias/myalgias particular in her hips bilaterally.  The review of systems is otherwise noncontributory.   PHYSICAL EXAMINATION: Blood pressure 110/75, pulse 70, temperature 98.5 F (36.9 C), temperature source Oral, resp. rate 20, height 5\' 5"  (1.651 m), weight 157 lb 3.2 oz (71.305 kg). Body mass index is 26.16 kg/(m^2).  General appearance: Alert, cooperative, well nourished, no apparent distress Head: Normocephalic, without obvious abnormality, atraumatic, the patient is wearing a wig Eyes: Conjunctivae/corneas clear, PERRLA, EOMI Nose: Nares, septum and mucosa are normal, no drainage or sinus tenderness Neck: No adenopathy, supple, symmetrical, trachea midline,  no tenderness Resp: Clear to auscultation bilaterally Cardio: Regular rate and rhythm, S1, S2 normal, no murmur, click,  rub or gallop, right chest Port-A-Cath is covered in EMLA cream Breasts:  Deferred GI: Soft, slightly distended, non-tender, hypoactive bowel sounds, no organomegaly Extremities: Extremities normal, atraumatic, no cyanosis or edema Lymph nodes: Cervical, supraclavicular, and axillary nodes normal Neurologic: Grossly normal  ECOG PERFORMANCE STATUS: 1 - Symptomatic but completely ambulatory   LABORATORY DATA: Lab Results  Component Value Date   WBC 5.2 09/02/2013   HGB 13.3 09/02/2013   HCT 39.6 09/02/2013   MCV 80.2 09/02/2013   PLT 191 09/02/2013      Chemistry      Component Value Date/Time   NA 140 09/02/2013 1020   NA 137 04/14/2013 0430   K 3.9 09/02/2013 1020   K 3.9 04/14/2013 0430   CL 104 06/10/2013 1312   CL 103 04/14/2013 0430   CO2 27 09/02/2013 1020   CO2 27 04/14/2013 0430   BUN 31.4* 09/02/2013 1020   BUN 13 04/14/2013 0430   CREATININE 0.6 09/02/2013 1020   CREATININE 0.55 04/14/2013 0430      Component Value Date/Time   CALCIUM 9.2 09/02/2013 1020   CALCIUM 8.2* 04/14/2013 0430   ALKPHOS 44 09/02/2013 1020   ALKPHOS 50 04/13/2013 2030   AST 24 09/02/2013 1020   AST 26 04/13/2013 2030   ALT 21 09/02/2013 1020   ALT 36* 04/13/2013 2030   BILITOT 0.32 09/02/2013 1020   BILITOT 0.5 04/13/2013 2030      STUDIES: No results found.   ASSESSMENT: 57 year old [place], [place] woman:  (1) Status post bilateral mastectomies on 12/31/2012 showing:  (a) On the right, no evidence of malignancy  (b) On the left, a pT2 pN0, stage IIA invasive ductal carcinoma, grade 2, estrogen receptor 100% positive, progesterone receptor 86% positive, with an MIB-1 of 46%, and HER-2 amplification with a CISH ratio of 6.81.  (2) Received adjuvant chemotherapy with Carboplatin/Docetaxel/Trastuzumab from 02/04/2013 through 05/13/2013 and was scheduled to consist of 6 cycles.  Unfortunately, the patient was unable to tolerate the entire course of chemotherapy and subsequently she requested  that we discontinued the last cycle of her treatment which would have been cycle #6.  She received a total of 5 of 6 chemotherapy cycles.  She continues to receive every 3 week Trastuzumab (Herceptin).   (3) Currently taking adjuvant antiestrogen therapy with letters saw 2.5 mg by mouth daily since her tumor was ER positive.   (4) Ongoing arthralgias/myalgias   PLAN:   #1 Proceed with every 3 week Herceptin therapy today.  #2  The patient will return in 3 weeks'  time  #3 Continue antiestrogen therapy with Letrozole 2.5 mg by mouth daily.  The patient is hardly taking a B complex and a multivitamin for arthralgias/myalgias.  Over-the-counter magnesium oxide 400 mg by mouth daily was also suggested.  #4 Her next 2-D echocardiogram is scheduled for 09/09/2013  #5 She is scheduled to complete latissimus dorsi flap breast reconstruction surgery on 09/29/2013.  All questions were answered.  Ms. Rennie Plowman knows to call the clinic with any problems, questions or concerns. We can certainly see her  much sooner if necessary.  Drue Second, MD Medical/Oncology [facility] [phone] (beeper) [phone] (Office)

## 2013-09-16 ENCOUNTER — Encounter (HOSPITAL_COMMUNITY): Payer: Self-pay | Admitting: Pharmacy Technician

## 2013-09-16 ENCOUNTER — Other Ambulatory Visit: Payer: Self-pay | Admitting: Plastic Surgery

## 2013-09-16 DIAGNOSIS — Z9012 Acquired absence of left breast and nipple: Secondary | ICD-10-CM

## 2013-09-16 NOTE — H&P (Signed)
Jane Gutierrez  09/14/2013 8:15 AM   Office Visit  MRN:  [id number]  Department:  Plastic Surgery  Dept Phone: [phone]  Description: Female DOB: 1956/05/09  Provider: Wayland Denis, DO    Diagnoses    Acquired absence of breast and absent nipple, bilateral    -  Primary   V45.71     Reason for Visit -  Breast Reconstruction     Vitals - Last Recorded    120/81  61  97.9 F (36.6 C)  1.664 m (5' 5.51")  68.04 kg (150 lb)  24.57 kg/m2     Subjective:    Patient ID: Jane Gutierrez is a 57 y.o. female.  HPI The patient is a 57 yrs old wf here for a history and physical for left breast reconstruction with latissimus muscle flap and expander placement.  She underwent bilateral mastectomies with expander and ADM placement. She then had chemotherapy and had recurrent seroma formation and infection of the left breast.  After lengthy discussions the decision was made to remove the left expander and do an autologous reconstruction when she is finished with the chemotherapy. Originally, a routine screening mammogram followed by an MRI showed a lesion of the left breast with a biopsy-proven DCIS. The right breast showed an irregular enhancing area. She is 5 feet 5 inches tall and weighs 165 pounds. Her bra was a 36 B-C and she wanted to be around a B. She stopped Chemotherapy for cardiac reasons.   The following portions of the patient's history were reviewed and updated as appropriate: allergies, current medications, past family history, past medical history, past social history, past surgical history and problem list.  Review of Systems  Constitutional: Negative.   Eyes: Negative.   Respiratory: Negative.   Cardiovascular: Negative.   Gastrointestinal: Negative.   Endocrine: Negative.   Genitourinary: Negative.   Allergic/Immunologic: Negative.   Neurological: Negative.   Hematological: Negative.   Psychiatric/Behavioral: Negative.       Objective:    Physical Exam  Constitutional: She  appears well-developed and well-nourished.  HENT:   Head: Normocephalic and atraumatic.  Eyes: Conjunctivae are normal. Pupils are equal, round, and reactive to light.  Neck: Normal range of motion.  Cardiovascular: Normal rate.   Pulmonary/Chest: Effort normal.  Abdominal: Soft. She exhibits no distension. There is no tenderness.  Musculoskeletal: Normal range of motion.  Neurological: She is alert.  Skin: Skin is warm.  Psychiatric: She has a normal mood and affect. Her behavior is normal. Thought content normal.      Assessment:    1.  Acquired absence of breast and absent nipple, bilateral       Plan:     The consent was obtained with risks and complications reviewed which included bleeding, pain, scar, infection and the risk of anesthesia.  The patients questions were answered to the patients expressed satisfaction. We will plan to do a left latissimus muscle flap with expander placement.    Medications Ordered This Encounter    HYDROcodone-acetaminophen (NORCO) 5-325 mg per tablet Take 1 tablet by mouth every 6 (six) hours as needed for up to 10 days for Pain.     ciprofloxacin (CIPRO) 500 MG tablet Take 1 tablet (500 mg total) by mouth 2 times daily.    diazepam (VALIUM) 2 MG tablet Take 1 tablet (2 mg total) by mouth every 6 (six) hours as needed for up to 10 days for Anxiety.    ondansetron (ZOFRAN) 4 MG tablet Take  1 tablet (4 mg total) by mouth every 8 (eight) hours as needed for up to 7 days for Nausea / Vomiting.     Discontinued Medications    ciprofloxacin (CIPRO) 250 MG tablet

## 2013-09-21 ENCOUNTER — Encounter (HOSPITAL_COMMUNITY)
Admission: RE | Admit: 2013-09-21 | Discharge: 2013-09-21 | Disposition: A | Discharge status: Home / Self Care | Payer: 59 | Source: Ambulatory Visit | Attending: Plastic Surgery | Admitting: Plastic Surgery

## 2013-09-21 ENCOUNTER — Encounter (HOSPITAL_COMMUNITY): Payer: Self-pay

## 2013-09-21 VITALS — BP 111/78 | HR 80 | Temp 98.6°F | Resp 20 | Ht 65.0 in | Wt 156.3 lb

## 2013-09-21 DIAGNOSIS — Z01818 Encounter for other preprocedural examination: Secondary | ICD-10-CM | POA: Insufficient documentation

## 2013-09-21 DIAGNOSIS — Z9012 Acquired absence of left breast and nipple: Secondary | ICD-10-CM

## 2013-09-21 DIAGNOSIS — Z01812 Encounter for preprocedural laboratory examination: Secondary | ICD-10-CM | POA: Insufficient documentation

## 2013-09-21 LAB — CBC
HCT: 42 % (ref 36.0–46.0)
Hemoglobin: 14.6 g/dL (ref 12.0–15.0)
MCH: 27.8 pg (ref 26.0–34.0)
MCHC: 34.8 g/dL (ref 30.0–36.0)
RBC: 5.25 MIL/uL — ABNORMAL HIGH (ref 3.87–5.11)
WBC: 5.6 10*3/uL (ref 4.0–10.5)

## 2013-09-21 LAB — COMPREHENSIVE METABOLIC PANEL
AST: 29 U/L (ref 0–37)
Alkaline Phosphatase: 52 U/L (ref 39–117)
BUN: 26 mg/dL — ABNORMAL HIGH (ref 6–23)
CO2: 25 mEq/L (ref 19–32)
GFR calc Af Amer: >90 mL/min (ref >90)
Glucose, Bld: 93 mg/dL (ref 70–99)
Potassium: 4.4 mEq/L (ref 3.5–5.1)
Total Protein: 7.4 g/dL (ref 6.0–8.3)

## 2013-09-21 NOTE — Pre-Procedure Instructions (Signed)
Tyhesha Frankey Shown  09/21/2013   Your procedure is scheduled on:  Wednesday, October 8th   Report to [facility]  2 * 3 at 10:30 AM.             Bonita Quin will come through Entrance "A"--FREE Valet Parking)   Call this number if you have problems the morning of surgery: [phone]   Remember:   Do not eat food or drink liquids after midnight Tuesday.   Take these medicines the morning of surgery with A SIP OF WATER: Levothyroxine, Ativan & please take inhaler   Do not wear jewelry, make-up or nail polish.  Do not wear lotions, powders, or perfumes. You may NOT wear deodorant.  Do not shave underarms & legs 48 hours prior to surgery.   Do not bring valuables to the hospital.  [facility] is not responsible  for any belongings or valuables.               Contacts, dentures or bridgework may not be worn into surgery.  Leave suitcase in the car. After surgery it may be brought to your room.  For patients admitted to the hospital, discharge time is determined by your treatment team.             Name and phone number of your driver:   Lilla Shook        Sister   Special Instructions: Shower using CHG 2 nights before surgery and the night before surgery.  If you shower the day of surgery use CHG.  Use special wash - you have one bottle of CHG for all showers.  You should use approximately 1/3 of the bottle for each shower.   Please read over the following fact sheets that you were given: Pain Booklet, Coughing and Deep Breathing and Surgical Site Infection Prevention

## 2013-09-22 ENCOUNTER — Encounter: Payer: Self-pay | Admitting: Oncology

## 2013-09-22 NOTE — Progress Notes (Signed)
06/22/13 Faxed 29 pages to Reece Levy, RN her Case Manager @ [facility] Fax # is [phone] and her phone number is [phone] Ext. [phone].

## 2013-09-22 NOTE — Progress Notes (Signed)
Faxed 34  pages to Reece Levy, RN her Case Manager @ [facility] Fax # is [phone] and her phone number is [phone] Ext. [phone].

## 2013-09-23 ENCOUNTER — Ambulatory Visit (HOSPITAL_BASED_OUTPATIENT_CLINIC_OR_DEPARTMENT_OTHER): Payer: 59 | Admitting: Adult Health

## 2013-09-23 ENCOUNTER — Encounter: Payer: Self-pay | Admitting: Adult Health

## 2013-09-23 ENCOUNTER — Other Ambulatory Visit (HOSPITAL_BASED_OUTPATIENT_CLINIC_OR_DEPARTMENT_OTHER): Payer: 59 | Admitting: Lab

## 2013-09-23 ENCOUNTER — Ambulatory Visit (HOSPITAL_BASED_OUTPATIENT_CLINIC_OR_DEPARTMENT_OTHER): Payer: 59

## 2013-09-23 VITALS — BP 128/75 | HR 99 | Temp 98.0°F | Resp 20 | Ht 65.0 in | Wt 157.4 lb

## 2013-09-23 DIAGNOSIS — IMO0001 Reserved for inherently not codable concepts without codable children: Secondary | ICD-10-CM

## 2013-09-23 DIAGNOSIS — Z5112 Encounter for antineoplastic immunotherapy: Secondary | ICD-10-CM

## 2013-09-23 DIAGNOSIS — C50412 Malignant neoplasm of upper-outer quadrant of left female breast: Secondary | ICD-10-CM

## 2013-09-23 DIAGNOSIS — M2550 Pain in unspecified joint: Secondary | ICD-10-CM

## 2013-09-23 DIAGNOSIS — C50419 Malignant neoplasm of upper-outer quadrant of unspecified female breast: Secondary | ICD-10-CM

## 2013-09-23 DIAGNOSIS — Z170 Estrogen receptor positive status [ER+]: Secondary | ICD-10-CM

## 2013-09-23 LAB — COMPREHENSIVE METABOLIC PANEL (CC13)
AST: 26 U/L (ref 5–34)
Alkaline Phosphatase: 51 U/L (ref 40–150)
BUN: 30.6 mg/dL — ABNORMAL HIGH (ref 7.0–26.0)
Chloride: 105 mEq/L (ref 98–109)
Glucose: 94 mg/dl (ref 70–140)
Sodium: 140 mEq/L (ref 136–145)
Total Bilirubin: 0.29 mg/dL (ref 0.20–1.20)

## 2013-09-23 LAB — CBC WITH DIFFERENTIAL/PLATELET
BASO%: 0.5 % (ref 0.0–2.0)
Basophils Absolute: 0 10*3/uL (ref 0.0–0.1)
Eosinophils Absolute: 0.1 10*3/uL (ref 0.0–0.5)
HCT: 40.6 % (ref 34.8–46.6)
LYMPH%: 41.6 % (ref 14.0–49.7)
MCHC: 34 g/dL (ref 31.5–36.0)
MONO#: 0.4 10*3/uL (ref 0.1–0.9)
NEUT#: 3.1 10*3/uL (ref 1.5–6.5)
NEUT%: 50 % (ref 38.4–76.8)
Platelets: 182 10*3/uL (ref 145–400)
RBC: 5.06 10*6/uL (ref 3.70–5.45)
WBC: 6.2 10*3/uL (ref 3.9–10.3)
lymph#: 2.6 10*3/uL (ref 0.9–3.3)
nRBC: 0 % (ref 0–0)

## 2013-09-23 MED ORDER — SODIUM CHLORIDE 0.9 % IV SOLN
Freq: Once | INTRAVENOUS | Status: AC
  Administered 2013-09-23: 16:00:00 via INTRAVENOUS

## 2013-09-23 MED ORDER — ACETAMINOPHEN 325 MG PO TABS
650.0000 mg | ORAL_TABLET | Freq: Once | ORAL | Status: AC
  Administered 2013-09-23: 650 mg via ORAL

## 2013-09-23 MED ORDER — TRASTUZUMAB CHEMO INJECTION 440 MG
6.0000 mg/kg | Freq: Once | INTRAVENOUS | Status: AC
  Administered 2013-09-23: 420 mg via INTRAVENOUS
  Filled 2013-09-23: qty 20

## 2013-09-23 MED ORDER — SODIUM CHLORIDE 0.9 % IJ SOLN
10.0000 mL | INTRAMUSCULAR | Status: DC | PRN
  Administered 2013-09-23: 10 mL
  Filled 2013-09-23: qty 10

## 2013-09-23 MED ORDER — HEPARIN SOD (PORK) LOCK FLUSH 100 UNIT/ML IV SOLN
500.0000 [IU] | Freq: Once | INTRAVENOUS | Status: AC | PRN
  Administered 2013-09-23: 500 [IU]
  Filled 2013-09-23: qty 5

## 2013-09-23 MED ORDER — ACETAMINOPHEN 325 MG PO TABS
ORAL_TABLET | ORAL | Status: AC
  Filled 2013-09-23: qty 2

## 2013-09-23 NOTE — Patient Instructions (Addendum)
[facility] Discharge Instructions for Patients Receiving Chemotherapy  Today you received the following chemotherapy agents Herceptin.  To help prevent nausea and vomiting after your treatment, we encourage you to take your nausea medication as prescribed.   If you develop nausea and vomiting that is not controlled by your nausea medication, call the clinic.   BELOW ARE SYMPTOMS THAT SHOULD BE REPORTED IMMEDIATELY:  *FEVER GREATER THAN 100.5 F  *CHILLS WITH OR WITHOUT FEVER  NAUSEA AND VOMITING THAT IS NOT CONTROLLED WITH YOUR NAUSEA MEDICATION  *UNUSUAL SHORTNESS OF BREATH  *UNUSUAL BRUISING OR BLEEDING  TENDERNESS IN MOUTH AND THROAT WITH OR WITHOUT PRESENCE OF ULCERS  *URINARY PROBLEMS  *BOWEL PROBLEMS  UNUSUAL RASH Items with * indicate a potential emergency and should be followed up as soon as possible.  Feel free to call the clinic you have any questions or concerns. The clinic phone number is [phone].    

## 2013-09-23 NOTE — Patient Instructions (Signed)
Doing well.  Proceed with herceptin.  Please call us if you have any questions or concerns.

## 2013-09-23 NOTE — Progress Notes (Signed)
[facility]  Telephone:([phone]) [phone] Fax:(336) [phone]  OFFICE PROGRESS NOTE   CC: Jane Hy, MD [address] [place] [address] Dr. Emelia Loron Dr. Chipper Herb Dr. Starleen Arms Dr. Dorothy Puffer   DIAGNOSIS: 57 year old female who presented on 12/24/2012 which new diagnosis of invasive ductal carcinoma, ER positive, PR positive, HER-2/neu positive in the left breast.     PRIOR THERAPY: #1  The patient has multiple medical problems but recently she had a mammogram performed that showed a 5.4 cm area of calcifications in the left breast.  MRI of the breasts showed the area in her left breast in the upper outer quadrant extended to about 7.2 cm. Needle core biopsy of the left breast showed a high-grade DCIS that was ER positive PR positive.  #2  She was seen by Dr. Emelia Loron regarding surgical options. The patient wanted to have bilateral mastectomies.  She is now status post bilateral mastectomy on 12/31/2012. The final pathology did reveal a 2.3 cm invasive ductal carcinoma in the left breast. The tumor was ER +100%, PR +86%,  HER-2/neu was amplified with a ratio of 6.81, Ki-67 was elevated at 46%. She is in the process of completing reconstruction.  #3 The patient was seen again in medical oncology for discussion of adjuvant treatment. Since her tumor was HER-2/neu positive, we recommended anti-HER-2/neu- based therapy consisting of Taxotere/Carboplatinum/Herceptin. The Taxotere and Carboplatinum was given every 3 weeks for 5 of 6 cycles. She received Neulasta on day 2. Herceptin was given weekly during the duration of chemotherapy and then every 3 weeks to finish out a one-year of treatment. She received systemic therapy with TCH from 02/04/2013 through 05/13/2013.  #4  The patient will continue Herceptin every 3 weeks to finish out 1 year.  #5  She began antiestrogen therapy with Letrozole 2.5 mg daily starting 06/03/2013 with a total of 5 years  of therapy planned at this time.   CURRENT THERAPY:  Q3 weekly Herceptin therapy.  Antiestrogen therapy with Letrozole at 2.5 mg by mouth daily.  INTERVAL HISTORY:   Jane Gutierrez is doing well today.  She is here for evaluation prior to adjuvant q3week Herceptin therapy.  She underwent an echocardiogram on 09/09/13 and was cleared by Dr. Gala Romney to continue Herceptin therapy.  She is taking Letrozole daily and continues to have joint aches.  She was working a couple of nights ago and was bitten by a dog.  The bite marks are healing and she is taking Cipro BID per Dr. Kelly Splinter.  She denies fevers, chills, shortness of breath, DOE, palpitations, orthopnea, or lower extremity swelling.  She is scheduled for reconstruction on 09/29/13.  Otherwise, she is well and w/o questions/concerns.    MEDICAL HISTORY: Past Medical History  Diagnosis Date  . IBS (irritable bowel syndrome)   . ADD (attention deficit disorder)   . Lyme disease   . Contact lens/glasses fitting     wears contacts or glasses  . Breast cancer 12/04/12    left, ER/PR +  . Allergy   . Asthma   . Thyroid disease   . Hypothyroidism     ALLERGIES:  is allergic to betadine; iodine; shellfish-derived products; sulfamethoxazole-trimethoprim; cleocin; and penicillins.  MEDICATIONS:  Current Outpatient Prescriptions  Medication Sig Dispense Refill  . albuterol (PROVENTIL HFA;VENTOLIN HFA) 108 (90 BASE) MCG/ACT inhaler Inhale 2 puffs into the lungs every 6 (six) hours as needed for shortness of breath.       [name] aspirin 81 MG  tablet Take 81 mg by mouth daily.      . B Complex-C (B-COMPLEX WITH VITAMIN C) tablet Take 1 tablet by mouth daily.      [name] dextroamphetamine (DEXTROSTAT) 5 MG tablet Take 2.5 mg by mouth 3 (three) times daily.       [name] EPINEPHrine (EPIPEN) 0.3 mg/0.3 mL DEVI Inject 0.3 mg into the muscle as needed (for allergic reaction).       . eszopiclone (LUNESTA) 2 MG TABS Take 1 tablet (2 mg total) by mouth at bedtime. Take  immediately before bedtime  30 tablet  5  . letrozole (FEMARA) 2.5 MG tablet Take 1 tablet (2.5 mg total) by mouth daily.  90 tablet  6  . levothyroxine (SYNTHROID, LEVOTHROID) 100 MCG tablet Take 100 mcg by mouth daily.      [name] lidocaine-prilocaine (EMLA) cream Apply 1 application topically as needed (for port-a-cath access).  30 g  2  . loratadine (CLARITIN) 10 MG tablet Take 10 mg by mouth daily as needed (for 5 days after Neulasta injection).      . LORazepam (ATIVAN) 1 MG tablet Take 1 tablet (1 mg total) by mouth every 8 (eight) hours as needed for anxiety.  60 tablet  0  . Multiple Vitamin (MULTIVITAMIN WITH MINERALS) TABS Take 1 tablet by mouth daily.      . sennosides-docusate sodium (SENOKOT-S) 8.6-50 MG tablet Take 1 tablet by mouth daily.       . vitamin C (ASCORBIC ACID) 500 MG tablet Take 500 mg by mouth daily.      . vitamin E (VITAMIN E) 400 UNIT capsule Take 800 Units by mouth daily.       No current facility-administered medications for this visit.    SURGICAL HISTORY:  Past Surgical History  Procedure Laterality Date  . Brain surgery      removal benign frontal lobe cyst in 1987  . Foot surgery  2013    bilateral  . Gynecologic cryosurgery      cervical cancer, 1998 last remained disease free with recent normal pap  . Diagnostic laparoscopy  2004    lysis adh-uterine ablation  . Breast surgery      bilateral mastopexy  . Breast surgery      removal benign left breast lesion  . Abdominal hysterectomy  2005    TAH/BSO-lysis adh, enometriosis  . Face lift    . Axillary sentinel node biopsy  12/31/2012    Procedure: AXILLARY SENTINEL NODE BIOPSY;  Surgeon: Emelia Loron, MD;  Location: [facility];  Service: General;  Laterality: Bilateral;  bilateral sentinel node  . Total mastectomy  12/31/2012    Procedure: TOTAL MASTECTOMY;  Surgeon: Emelia Loron, MD;  Location: [facility];  Service: General;  Laterality: Bilateral;  bilateral  total mastectomies  . Breast reconstruction with placement of tissue expander and flex hd (acellular hydrated dermis)  12/31/2012    Procedure: BREAST RECONSTRUCTION WITH PLACEMENT OF TISSUE EXPANDER AND FLEX HD (ACELLULAR HYDRATED DERMIS);  Surgeon: Wayland Denis, DO;  Location: [facility];  Service: Plastics;  Laterality: Bilateral;  bilateral immediate breast reconstruction with expanders and flex hd   . Portacath placement  01/27/2013    Procedure: INSERTION PORT-A-CATH;  Surgeon: Emelia Loron, MD;  Location: [facility];  Service: General;  Laterality: Right;  . Incision and drainage of wound Left 04/28/2013    Procedure: IRRIGATION AND DEBRIDEMENT LEFT BREAST WOUND, POSSIBLE CLOSURE OF WOUND WITH DRAIN PLACEMENT;  Surgeon: Wayland Denis, DO;  Location: [facility];  Service: Plastics;  Laterality: Left;  [name] Mastectomy  1/14  . Tee without cardioversion N/A 06/04/2013    Procedure: TRANSESOPHAGEAL ECHOCARDIOGRAM (TEE);  Surgeon: Dolores Patty, MD;  Location: [facility];  Service: Cardiovascular;  Laterality: N/A;  . Tissue expander placement Left 06/16/2013    Procedure: LEFT BREAST TISSUE EXPANDER REMOVAL;  Surgeon: Wayland Denis, DO;  Location: [facility];  Service: Plastics;  Laterality: Left;    REVIEW OF SYSTEMS:  A 10 point review of systems was completed and is negative except for ongoing arthralgias/myalgias particular in her hips bilaterally.  The review of systems is otherwise noncontributory.   PHYSICAL EXAMINATION: Blood pressure 128/75, pulse 99, temperature 98 F (36.7 C), temperature source Oral, resp. rate 20, height 5\' 5"  (1.651 m), weight 157 lb 6.4 oz (71.396 kg). Body mass index is 26.19 kg/(m^2). General: Patient is a well appearing female in no acute distress HEENT: PERRLA, sclerae anicteric no conjunctival pallor, MMM Neck: supple, no palpable adenopathy Lungs: clear to auscultation bilaterally, no  wheezes, rhonchi, or rales Cardiovascular: regular rate rhythm, S1, S2, no murmurs, rubs or gallops Abdomen: Soft, non-tender, non-distended, normoactive bowel sounds, no HSM Extremities: warm and well perfused, no clubbing, cyanosis, or edema Skin: No rashes or lesions, healing puncture wound on right shin, no surrounding erythema, warmth, or drainage.   Neuro: Non-focal Breasts, left mastectomy site non-erythematous, well healed, no nodularity. ECOG PERFORMANCE STATUS: 1 - Symptomatic but completely ambulatory   LABORATORY DATA: Lab Results  Component Value Date   WBC 6.2 09/23/2013   HGB 13.8 09/23/2013   HCT 40.6 09/23/2013   MCV 80.2 09/23/2013   PLT 182 09/23/2013      Chemistry      Component Value Date/Time   NA 137 09/21/2013 0842   NA 140 09/02/2013 1020   K 4.4 09/21/2013 0842   K 3.9 09/02/2013 1020   CL 101 09/21/2013 0842   CL 104 06/10/2013 1312   CO2 25 09/21/2013 0842   CO2 27 09/02/2013 1020   BUN 26* 09/21/2013 0842   BUN 31.4* 09/02/2013 1020   CREATININE 0.59 09/21/2013 0842   CREATININE 0.6 09/02/2013 1020      Component Value Date/Time   CALCIUM 9.4 09/21/2013 0842   CALCIUM 9.2 09/02/2013 1020   ALKPHOS 52 09/21/2013 0842   ALKPHOS 44 09/02/2013 1020   AST 29 09/21/2013 0842   AST 24 09/02/2013 1020   ALT 24 09/21/2013 0842   ALT 21 09/02/2013 1020   BILITOT 0.3 09/21/2013 0842   BILITOT 0.32 09/02/2013 1020      STUDIES: No results found.   ASSESSMENT: 57 year old [place] woman:  (1) Status post bilateral mastectomies on 12/31/2012 showing:  (a) On the right, no evidence of malignancy  (b) On the left, a pT2 pN0, stage IIA invasive ductal carcinoma, grade 2, estrogen receptor 100% positive, progesterone receptor 86% positive, with an MIB-1 of 46%, and HER-2 amplification with a CISH ratio of 6.81.  (2) Received adjuvant chemotherapy with Carboplatin/Docetaxel/Trastuzumab from 02/04/2013 through 05/13/2013 and was scheduled to consist of 6  cycles.  Unfortunately, the patient was unable to tolerate the entire course of chemotherapy and subsequently she requested that we discontinued the last cycle of her treatment which would have been cycle #6.  She received a total of 5 of 6 chemotherapy cycles.  She continues to receive every 3 week Trastuzumab (Herceptin).   (3) Currently taking  adjuvant antiestrogen therapy with letters saw 2.5 mg by mouth daily since her tumor was ER positive.   (4) Ongoing arthralgias/myalgias   PLAN:   #1 Proceed with every 3 week Herceptin therapy today.  #2  The patient will return in 3 weeks' time  #3 Continue antiestrogen therapy with Letrozole 2.5 mg by mouth daily.  The patient is taking a B complex and a multivitamin for arthralgias/myalgias. She is also taking Magnesium as suggested by Dr. Welton Flakes at her last appointment  #4 Her most recent 2-D echocardiogram was 09/09/2013 and she was cleared to continue Herceptin therapy by Dr. Gala Romney.    #5 She will proceed latissimus dorsi flap breast reconstruction surgery on 09/29/2013.  Dr. Kelly Splinter is monitoring her dog bite wound for infection, she is taking Cipro as directed.  There is no sign of infection today.    All questions were answered.  Jane Gutierrez knows to call the clinic with any problems, questions or concerns. We can certainly see her  much sooner if necessary.  Cherie Ouch Lyn Hollingshead, NP Medical Oncology [facility] Phone: [phone]

## 2013-09-28 ENCOUNTER — Other Ambulatory Visit: Payer: Self-pay | Admitting: Plastic Surgery

## 2013-09-28 DIAGNOSIS — Z9012 Acquired absence of left breast and nipple: Secondary | ICD-10-CM

## 2013-09-28 MED ORDER — CIPROFLOXACIN IN D5W 400 MG/200ML IV SOLN
400.0000 mg | INTRAVENOUS | Status: AC
  Administered 2013-09-29: 400 mg via INTRAVENOUS
  Filled 2013-09-28: qty 200

## 2013-09-28 NOTE — H&P (Signed)
This document contains confidential information from a [facility] medical record system and may be unauthenticated. Release may be made only with a valid authorization or in accordance with applicable policies of Medical Center or its affiliates. This document must be maintained in a secure manner or discarded/destroyed as required by Medical Center policy or by a confidential means such as shredding.     Jane Gutierrez  09/24/2013 8:30 AM   Office Visit  MRN:  [id number]  Department: [facility]  Dept Phone: [phone]   Description: Female DOB: 05/11/1956  Provider: Shawn Montgomery Rayburn, PA-C     Diagnoses    Acquired absence of bilateral breasts and nipples    -  Primary   V45.71    Vitals - Last Recorded      113/80  85  98.1 F (36.7 C)  1.664 m (5' 5.51")  68.04 kg (150 lb)  24.57 kg/m2    Subjective:    Patient ID: Jane Gutierrez is a 56 y.o. female.  HPI The patient is a 56 yrs old wf here for a visit following a dog bite. She is for left breast reconstruction with latissimus muscle flap and expander placement next week. History:   She underwent bilateral mastectomies with expander and ADM placement. She then had chemotherapy and had recurrent seroma formation and infection of the left breast.  After lengthy discussions the decision was made to remove the left expander and do an autologous reconstruction when she is finished with the chemotherapy. Originally, a routine screening mammogram followed by an MRI showed a lesion of the left breast with a biopsy-proven DCIS. The right breast showed an irregular enhancing area. She is 5 feet 5 inches tall and weighs 165 pounds. Her bra was a 36 B-C and she wanted to be around a B. She stopped Chemotherapy for cardiac reasons.   She comes in today due to being bit by a dog on the left lower leg a couple of days ago. The bite did puncture the skin. She started on Cipro following the dog bite. She is due to have a  latissimus flap reconstruction next week to the left breast. The area does show some small punctures and scratches, but does not appear to be infected. There is no drainage, or warmth or induration of the area.   She is up to date on her tetanus and the dog had had it's rabies shot.   The following portions of the patient's history were reviewed and updated as appropriate: allergies, current medications, past family history, past medical history, past social history, past surgical history and problem list.  Review of Systems  All other systems reviewed and are negative.      Objective:    Physical Exam  Constitutional: She appears well-developed and well-nourished. No distress.  Cardiovascular: Normal rate, regular rhythm and intact distal pulses.   Pulmonary/Chest: Effort normal. No respiratory distress.  Musculoskeletal: Normal range of motion.  The area does show some small punctures and scratches, but does not appear to be infected. There is no drainage, or warmth or induration of the area.   Neurological: She is alert.  Skin: Skin is warm and dry.  Psychiatric: She has a normal mood and affect. Her behavior is normal. Judgment and thought content normal.      Assessment:   1.  Acquired absence of bilateral breasts and nipples          Plan:   Dr. Sanger saw   the patient and recommended she continue her Cipro course and that we can proceed with surgery next week. It the area changes any or shows any sign of worsening she will let us know prior to surgery. We will plan for left latissimus/expander placement.      Medications Ordered This Encounter      ciprofloxacin (CIPRO) 500 MG tablet 21 tablet 0 09/24/2013      Take 1 tablet (500 mg total) by mouth 2 times daily. - Oral   

## 2013-09-29 ENCOUNTER — Inpatient Hospital Stay (HOSPITAL_COMMUNITY)
Admission: RE | Admit: 2013-09-29 | Discharge: 2013-10-01 | DRG: 583 | Disposition: A | Discharge status: Home / Self Care | Payer: 59 | Source: Ambulatory Visit | Attending: Plastic Surgery | Admitting: Plastic Surgery

## 2013-09-29 ENCOUNTER — Encounter (HOSPITAL_COMMUNITY): Payer: 59 | Admitting: Certified Registered Nurse Anesthetist

## 2013-09-29 ENCOUNTER — Encounter (HOSPITAL_COMMUNITY)
Admission: RE | Disposition: A | Discharge status: Home / Self Care | Payer: Self-pay | Source: Ambulatory Visit | Attending: Plastic Surgery

## 2013-09-29 ENCOUNTER — Inpatient Hospital Stay (HOSPITAL_COMMUNITY): Payer: 59 | Admitting: Certified Registered Nurse Anesthetist

## 2013-09-29 ENCOUNTER — Encounter (HOSPITAL_COMMUNITY): Payer: Self-pay | Admitting: *Deleted

## 2013-09-29 DIAGNOSIS — Z9012 Acquired absence of left breast and nipple: Secondary | ICD-10-CM

## 2013-09-29 DIAGNOSIS — Z9010 Acquired absence of unspecified breast and nipple: Secondary | ICD-10-CM

## 2013-09-29 DIAGNOSIS — C50919 Malignant neoplasm of unspecified site of unspecified female breast: Principal | ICD-10-CM | POA: Diagnosis present

## 2013-09-29 HISTORY — PX: LATISSIMUS FLAP TO BREAST: SHX5357

## 2013-09-29 HISTORY — PX: TISSUE EXPANDER PLACEMENT: SHX2530

## 2013-09-29 HISTORY — DX: Unspecified osteoarthritis, unspecified site: M19.90

## 2013-09-29 LAB — CBC
HCT: 37.2 % (ref 36.0–46.0)
Platelets: 166 10*3/uL (ref 150–400)
RBC: 4.63 MIL/uL (ref 3.87–5.11)
WBC: 9 10*3/uL (ref 4.0–10.5)

## 2013-09-29 LAB — CREATININE, SERUM
Creatinine, Ser: 0.51 mg/dL (ref 0.50–1.10)
GFR calc Af Amer: >90 mL/min (ref >90)
GFR calc non Af Amer: >90 mL/min (ref >90)

## 2013-09-29 SURGERY — RECONSTRUCTION, BREAST, USING LATISSIMUS DORSI MYOCUTANEOUS FLAP
Anesthesia: General | Site: Chest | Laterality: Left | Wound class: Clean

## 2013-09-29 MED ORDER — HYDROMORPHONE HCL PF 1 MG/ML IJ SOLN
INTRAMUSCULAR | Status: AC
  Administered 2013-09-29: 0.5 mg via INTRAVENOUS
  Filled 2013-09-29: qty 1

## 2013-09-29 MED ORDER — PROMETHAZINE HCL 25 MG PO TABS
25.0000 mg | ORAL_TABLET | Freq: Four times a day (QID) | ORAL | Status: DC | PRN
Start: 2013-09-29 — End: 2013-10-01

## 2013-09-29 MED ORDER — GLYCOPYRROLATE 0.2 MG/ML IJ SOLN
INTRAMUSCULAR | Status: DC | PRN
  Administered 2013-09-29: 0.6 mg via INTRAVENOUS

## 2013-09-29 MED ORDER — DOCUSATE SODIUM 100 MG PO CAPS
100.0000 mg | ORAL_CAPSULE | Freq: Three times a day (TID) | ORAL | Status: DC
  Administered 2013-09-30 (×3): 100 mg via ORAL
  Filled 2013-09-29 (×3): qty 1

## 2013-09-29 MED ORDER — MORPHINE SULFATE (PF) 1 MG/ML IV SOLN
INTRAVENOUS | Status: DC
  Administered 2013-09-29: 21:00:00 via INTRAVENOUS
  Administered 2013-09-30 (×2): 4.5 mg via INTRAVENOUS
  Administered 2013-09-30: 6 mg via INTRAVENOUS
  Filled 2013-09-29: qty 25

## 2013-09-29 MED ORDER — DIPHENHYDRAMINE HCL 12.5 MG/5ML PO ELIX: 12.5000 mg | ORAL_SOLUTION | Freq: Four times a day (QID) | ORAL | Status: DC | PRN

## 2013-09-29 MED ORDER — NALOXONE HCL 0.4 MG/ML IJ SOLN: 0.4000 mg | INTRAMUSCULAR | Status: DC | PRN

## 2013-09-29 MED ORDER — ONDANSETRON HCL 4 MG/2ML IJ SOLN: 4.0000 mg | Freq: Once | INTRAMUSCULAR | Status: DC | PRN

## 2013-09-29 MED ORDER — ONDANSETRON HCL 4 MG/2ML IJ SOLN
4.0000 mg | Freq: Four times a day (QID) | INTRAMUSCULAR | Status: DC | PRN
  Administered 2013-09-29: 4 mg via INTRAVENOUS
  Filled 2013-09-29: qty 2

## 2013-09-29 MED ORDER — ONDANSETRON HCL 4 MG/2ML IJ SOLN
INTRAMUSCULAR | Status: DC | PRN
  Administered 2013-09-29: 4 mg via INTRAMUSCULAR

## 2013-09-29 MED ORDER — HYDROMORPHONE HCL PF 1 MG/ML IJ SOLN
0.2500 mg | INTRAMUSCULAR | Status: DC | PRN
  Administered 2013-09-29 (×4): 0.5 mg via INTRAVENOUS

## 2013-09-29 MED ORDER — KCL IN DEXTROSE-NACL 20-5-0.45 MEQ/L-%-% IV SOLN
INTRAVENOUS | Status: DC
  Administered 2013-09-29: 19:00:00 via INTRAVENOUS
  Administered 2013-09-30: 20 mL/h via INTRAVENOUS
  Administered 2013-09-30: 06:00:00 via INTRAVENOUS
  Filled 2013-09-29 (×3): qty 1000

## 2013-09-29 MED ORDER — EVICEL 5 ML EX KIT
PACK | CUTANEOUS | Status: DC | PRN
  Administered 2013-09-29 (×2): 1

## 2013-09-29 MED ORDER — ONDANSETRON HCL 4 MG/2ML IJ SOLN: 4.0000 mg | Freq: Four times a day (QID) | INTRAMUSCULAR | Status: DC | PRN

## 2013-09-29 MED ORDER — PROMETHAZINE HCL 25 MG/ML IJ SOLN
12.5000 mg | Freq: Four times a day (QID) | INTRAMUSCULAR | Status: DC | PRN
  Administered 2013-09-29: 12.5 mg via INTRAVENOUS
  Filled 2013-09-29: qty 1

## 2013-09-29 MED ORDER — LACTATED RINGERS IV SOLN
INTRAVENOUS | Status: DC
  Administered 2013-09-29: 11:00:00 via INTRAVENOUS

## 2013-09-29 MED ORDER — SODIUM CHLORIDE 0.9 % IJ SOLN: 9.0000 mL | INTRAMUSCULAR | Status: DC | PRN

## 2013-09-29 MED ORDER — ALBUTEROL SULFATE HFA 108 (90 BASE) MCG/ACT IN AERS
2.0000 | INHALATION_SPRAY | Freq: Four times a day (QID) | RESPIRATORY_TRACT | Status: DC | PRN
  Filled 2013-09-29: qty 6.7

## 2013-09-29 MED ORDER — ENOXAPARIN SODIUM 40 MG/0.4ML ~~LOC~~ SOLN
40.0000 mg | SUBCUTANEOUS | Status: DC
  Administered 2013-09-30: 40 mg via SUBCUTANEOUS
  Filled 2013-09-29 (×2): qty 0.4

## 2013-09-29 MED ORDER — SODIUM CHLORIDE 0.9 % IR SOLN
Status: DC | PRN
  Administered 2013-09-29: 15:00:00

## 2013-09-29 MED ORDER — PROPOFOL 10 MG/ML IV BOLUS
INTRAVENOUS | Status: DC | PRN
  Administered 2013-09-29: 200 mg via INTRAVENOUS

## 2013-09-29 MED ORDER — DIPHENHYDRAMINE HCL 50 MG/ML IJ SOLN: 12.5000 mg | Freq: Four times a day (QID) | INTRAMUSCULAR | Status: DC | PRN

## 2013-09-29 MED ORDER — ROCURONIUM BROMIDE 100 MG/10ML IV SOLN
INTRAVENOUS | Status: DC | PRN
  Administered 2013-09-29: 10 mg via INTRAVENOUS
  Administered 2013-09-29: 20 mg via INTRAVENOUS
  Administered 2013-09-29 (×3): 10 mg via INTRAVENOUS
  Administered 2013-09-29: 25 mg via INTRAVENOUS

## 2013-09-29 MED ORDER — DIAZEPAM 2 MG PO TABS
2.0000 mg | ORAL_TABLET | Freq: Two times a day (BID) | ORAL | Status: DC | PRN
  Administered 2013-09-30: 2 mg via ORAL
  Filled 2013-09-29: qty 1

## 2013-09-29 MED ORDER — HYDROMORPHONE 0.3 MG/ML IV SOLN
INTRAVENOUS | Status: DC
Start: 2013-09-29 — End: 2013-09-29
  Administered 2013-09-29: 17:00:00 via INTRAVENOUS

## 2013-09-29 MED ORDER — BUPIVACAINE HCL (PF) 0.25 % IJ SOLN
INTRAMUSCULAR | Status: AC
  Filled 2013-09-29: qty 30

## 2013-09-29 MED ORDER — NEOSTIGMINE METHYLSULFATE 1 MG/ML IJ SOLN
INTRAMUSCULAR | Status: DC | PRN
  Administered 2013-09-29: 4 mg via INTRAVENOUS

## 2013-09-29 MED ORDER — LORATADINE 10 MG PO TABS
10.0000 mg | ORAL_TABLET | Freq: Every day | ORAL | Status: DC | PRN
  Administered 2013-09-30: 10 mg via ORAL
  Filled 2013-09-29: qty 1

## 2013-09-29 MED ORDER — LIDOCAINE HCL (CARDIAC) 20 MG/ML IV SOLN
INTRAVENOUS | Status: DC | PRN
  Administered 2013-09-29: 50 mg via INTRAVENOUS

## 2013-09-29 MED ORDER — LEVOTHYROXINE SODIUM 100 MCG PO TABS
100.0000 ug | ORAL_TABLET | Freq: Every day | ORAL | Status: DC
  Administered 2013-09-30 – 2013-10-01 (×2): 100 ug via ORAL
  Filled 2013-09-29 (×4): qty 1

## 2013-09-29 MED ORDER — LACTATED RINGERS IV SOLN
INTRAVENOUS | Status: DC | PRN
  Administered 2013-09-29 (×3): via INTRAVENOUS

## 2013-09-29 MED ORDER — 0.9 % SODIUM CHLORIDE (POUR BTL) OPTIME
TOPICAL | Status: DC | PRN
  Administered 2013-09-29: 1000 mL

## 2013-09-29 MED ORDER — DEXAMETHASONE SODIUM PHOSPHATE 4 MG/ML IJ SOLN
INTRAMUSCULAR | Status: DC | PRN
  Administered 2013-09-29 (×2): 4 mg via INTRAVENOUS

## 2013-09-29 MED ORDER — EVICEL 2 ML EX KIT
PACK | CUTANEOUS | Status: AC
  Filled 2013-09-29: qty 2

## 2013-09-29 MED ORDER — HYDROMORPHONE 0.3 MG/ML IV SOLN
INTRAVENOUS | Status: AC
  Filled 2013-09-29: qty 25

## 2013-09-29 MED ORDER — CIPROFLOXACIN IN D5W 400 MG/200ML IV SOLN
400.0000 mg | Freq: Two times a day (BID) | INTRAVENOUS | Status: DC
  Administered 2013-09-29 – 2013-09-30 (×2): 400 mg via INTRAVENOUS
  Filled 2013-09-29 (×3): qty 200

## 2013-09-29 MED ORDER — FENTANYL CITRATE 0.05 MG/ML IJ SOLN
INTRAMUSCULAR | Status: DC | PRN
Start: 2013-09-29 — End: 2013-09-29
  Administered 2013-09-29 (×4): 50 ug via INTRAVENOUS
  Administered 2013-09-29: 100 ug via INTRAVENOUS
  Administered 2013-09-29: 50 ug via INTRAVENOUS

## 2013-09-29 MED ORDER — MIDAZOLAM HCL 5 MG/5ML IJ SOLN
INTRAMUSCULAR | Status: DC | PRN
  Administered 2013-09-29: 2 mg via INTRAVENOUS

## 2013-09-29 SURGICAL SUPPLY — 74 items
APPLIER CLIP 9.375 MED OPEN (MISCELLANEOUS) ×2
BAG DECANTER FOR FLEXI CONT (MISCELLANEOUS) ×2 IMPLANT
BINDER BREAST LRG (GAUZE/BANDAGES/DRESSINGS) ×2 IMPLANT
BINDER BREAST XLRG (GAUZE/BANDAGES/DRESSINGS) IMPLANT
BIOPATCH RED 1 DISK 7.0 (GAUZE/BANDAGES/DRESSINGS) ×6 IMPLANT
BLADE SURG 10 STRL SS (BLADE) ×2 IMPLANT
BLADE SURG 15 STRL LF DISP TIS (BLADE) ×1 IMPLANT
BLADE SURG 15 STRL SS (BLADE) ×1
BNDG COHESIVE 4X5 TAN STRL (GAUZE/BANDAGES/DRESSINGS) IMPLANT
CANISTER SUCTION 2500CC (MISCELLANEOUS) ×2 IMPLANT
CHLORAPREP W/TINT 26ML (MISCELLANEOUS) ×4 IMPLANT
CLIP APPLIE 9.375 MED OPEN (MISCELLANEOUS) ×1 IMPLANT
CLOTH BEACON ORANGE TIMEOUT ST (SAFETY) ×2 IMPLANT
COVER SURGICAL LIGHT HANDLE (MISCELLANEOUS) ×2 IMPLANT
DERMABOND ADVANCED (GAUZE/BANDAGES/DRESSINGS) ×2
DERMABOND ADVANCED .7 DNX12 (GAUZE/BANDAGES/DRESSINGS) ×2 IMPLANT
DRAIN CHANNEL 19F RND (DRAIN) ×6 IMPLANT
DRAPE INCISE 23X17 IOBAN STRL (DRAPES)
DRAPE INCISE IOBAN 23X17 STRL (DRAPES) IMPLANT
DRAPE INCISE IOBAN 85X60 (DRAPES) IMPLANT
DRAPE ORTHO SPLIT 77X108 STRL (DRAPES) ×4
DRAPE PROXIMA HALF (DRAPES) ×8 IMPLANT
DRAPE SURG 17X23 STRL (DRAPES) ×8 IMPLANT
DRAPE SURG ORHT 6 SPLT 77X108 (DRAPES) ×4 IMPLANT
DRAPE WARM FLUID 44X44 (DRAPE) ×2 IMPLANT
DRSG MEPILEX BORDER 4X8 (GAUZE/BANDAGES/DRESSINGS) ×2 IMPLANT
DRSG PAD ABDOMINAL 8X10 ST (GAUZE/BANDAGES/DRESSINGS) ×2 IMPLANT
ELECT BLADE 4.0 EZ CLEAN MEGAD (MISCELLANEOUS) ×4
ELECT BLADE 6.5 EXT (BLADE) ×2 IMPLANT
ELECT CAUTERY BLADE 6.4 (BLADE) ×6 IMPLANT
ELECT REM PT RETURN 9FT ADLT (ELECTROSURGICAL) ×2
ELECTRODE BLDE 4.0 EZ CLN MEGD (MISCELLANEOUS) ×2 IMPLANT
ELECTRODE REM PT RTRN 9FT ADLT (ELECTROSURGICAL) ×1 IMPLANT
EVACUATOR SILICONE 100CC (DRAIN) ×6 IMPLANT
GAUZE XEROFORM 5X9 LF (GAUZE/BANDAGES/DRESSINGS) ×6 IMPLANT
GEL ULTRASOUND 20GR AQUASONIC (MISCELLANEOUS) ×2 IMPLANT
GLOVE BIO SURGEON STRL SZ 6 (GLOVE) ×4 IMPLANT
GLOVE BIO SURGEON STRL SZ 6.5 (GLOVE) ×8 IMPLANT
GOWN STRL NON-REIN LRG LVL3 (GOWN DISPOSABLE) ×12 IMPLANT
IMPL BREAST TIS EXP M 350CC (Breast) ×1 IMPLANT
IMPLANT BREAST TIS EXP M 350CC (Breast) ×2 IMPLANT
KIT BASIN OR (CUSTOM PROCEDURE TRAY) ×2 IMPLANT
KIT ROOM TURNOVER OR (KITS) ×2 IMPLANT
NS IRRIG 1000ML POUR BTL (IV SOLUTION) ×4 IMPLANT
PACK GENERAL/GYN (CUSTOM PROCEDURE TRAY) ×2 IMPLANT
PAD ARMBOARD 7.5X6 YLW CONV (MISCELLANEOUS) ×6 IMPLANT
PENCIL BUTTON HOLSTER BLD 10FT (ELECTRODE) ×4 IMPLANT
PIN SAFETY STERILE (MISCELLANEOUS) ×2 IMPLANT
SET ASEPTIC TRANSFER (MISCELLANEOUS) ×2 IMPLANT
SET COLLECT BLD 21X3/4 12 PB (MISCELLANEOUS) IMPLANT
SPONGE GAUZE 4X4 12PLY (GAUZE/BANDAGES/DRESSINGS) ×4 IMPLANT
SPONGE LAP 18X18 X RAY DECT (DISPOSABLE) ×2 IMPLANT
STAPLER VISISTAT 35W (STAPLE) ×2 IMPLANT
STOCKINETTE IMPERVIOUS 9X36 MD (GAUZE/BANDAGES/DRESSINGS) IMPLANT
STOPCOCK 4 WAY LG BORE MALE ST (IV SETS) ×2 IMPLANT
STRIP CLOSURE SKIN 1/2X4 (GAUZE/BANDAGES/DRESSINGS) ×4 IMPLANT
SUT ETHILON 2 0 FS 18 (SUTURE) ×6 IMPLANT
SUT MNCRL AB 3-0 PS2 18 (SUTURE) ×4 IMPLANT
SUT MNCRL AB 4-0 PS2 18 (SUTURE) ×4 IMPLANT
SUT MON AB 5-0 PS2 18 (SUTURE) ×8 IMPLANT
SUT PDS AB 2-0 CT1 27 (SUTURE) ×12 IMPLANT
SUT SILK 3 0 SH 30 (SUTURE) ×4 IMPLANT
SUT VIC AB 3-0 PS2 18 (SUTURE) ×6
SUT VIC AB 3-0 PS2 18XBRD (SUTURE) ×6 IMPLANT
SUT VIC AB 3-0 SH 27 (SUTURE) ×4
SUT VIC AB 3-0 SH 27X BRD (SUTURE) ×4 IMPLANT
SUT VIC AB 3-0 SH 8-18 (SUTURE) ×2 IMPLANT
SUT VICRYL 4-0 PS2 18IN ABS (SUTURE) ×6 IMPLANT
SYR 50ML SLIP (SYRINGE) ×2 IMPLANT
SYR BULB IRRIGATION 50ML (SYRINGE) ×2 IMPLANT
TIP FLEX 45CM EVICEL (HEMOSTASIS) ×4 IMPLANT
TOWEL OR 17X24 6PK STRL BLUE (TOWEL DISPOSABLE) ×2 IMPLANT
TOWEL OR 17X26 10 PK STRL BLUE (TOWEL DISPOSABLE) ×4 IMPLANT
TRAY FOLEY CATH 14FRSI W/METER (CATHETERS) ×2 IMPLANT

## 2013-09-29 NOTE — Anesthesia Procedure Notes (Signed)
Procedure Name: Intubation Date/Time: 09/29/2013 12:32 PM Performed by: Orvilla Fus A Pre-anesthesia Checklist: Patient identified, Timeout performed, Emergency Drugs available, Suction available and Patient being monitored Patient Re-evaluated:Patient Re-evaluated prior to inductionOxygen Delivery Method: Circle system utilized Preoxygenation: Pre-oxygenation with 100% oxygen Intubation Type: IV induction Ventilation: Mask ventilation without difficulty Laryngoscope Size: Mac and 3 Grade View: Grade I Tube type: Oral Number of attempts: 1 Placement Confirmation: ETT inserted through vocal cords under direct vision,  breath sounds checked- equal and bilateral and positive ETCO2 Secured at: 21 cm Tube secured with: Tape Dental Injury: Teeth and Oropharynx as per pre-operative assessment

## 2013-09-29 NOTE — Transfer of Care (Signed)
Immediate Anesthesia Transfer of Care Note  Patient: Jane Gutierrez  Procedure(s) Performed: Procedure(s): LATISSIMUS FLAP TO BREAST WITH TISSUE EXPANDER PLACEMENT FOR LEFT BREAST RECONSTRUCTION (Left) TISSUE EXPANDER (Left)  Patient Location: PACU  Anesthesia Type:General  Level of Consciousness: sedated  Airway & Oxygen Therapy: Patient Spontanous Breathing and Patient connected to face mask oxygen  Post-op Assessment: Report given to PACU RN and Post -op Vital signs reviewed and stable  Post vital signs: stable  Complications: No apparent anesthesia complications

## 2013-09-29 NOTE — H&P (View-Only) (Signed)
This document contains confidential information from a [facility] medical record system and may be unauthenticated. Release may be made only with a valid authorization or in accordance with applicable policies of Medical Center or its affiliates. This document must be maintained in a secure manner or discarded/destroyed as required by Medical Center policy or by a confidential means such as shredding.     CURTIS URIARTE  09/24/2013 8:30 AM   Office Visit  MRN:  [id number]  Department: [facility]  Dept Phone: [phone]   Description: Female DOB: 1956/09/08  Provider: Fanny Bien Rayburn, PA-C     Diagnoses    Acquired absence of bilateral breasts and nipples    -  Primary   V45.71    Vitals - Last Recorded      113/80  85  98.1 F (36.7 C)  1.664 m (5' 5.51")  68.04 kg (150 lb)  24.57 kg/m2    Subjective:    Patient ID: Jane Gutierrez is a 57 y.o. female.  HPI The patient is a 57 yrs old wf here for a visit following a dog bite. She is for left breast reconstruction with latissimus muscle flap and expander placement next week. History:   She underwent bilateral mastectomies with expander and ADM placement. She then had chemotherapy and had recurrent seroma formation and infection of the left breast.  After lengthy discussions the decision was made to remove the left expander and do an autologous reconstruction when she is finished with the chemotherapy. Originally, a routine screening mammogram followed by an MRI showed a lesion of the left breast with a biopsy-proven DCIS. The right breast showed an irregular enhancing area. She is 5 feet 5 inches tall and weighs 165 pounds. Her bra was a 36 B-C and she wanted to be around a B. She stopped Chemotherapy for cardiac reasons.   She comes in today due to being bit by a dog on the left lower leg a couple of days ago. The bite did puncture the skin. She started on Cipro following the dog bite. She is due to have a  latissimus flap reconstruction next week to the left breast. The area does show some small punctures and scratches, but does not appear to be infected. There is no drainage, or warmth or induration of the area.   She is up to date on her tetanus and the dog had had it's rabies shot.   The following portions of the patient's history were reviewed and updated as appropriate: allergies, current medications, past family history, past medical history, past social history, past surgical history and problem list.  Review of Systems  All other systems reviewed and are negative.      Objective:    Physical Exam  Constitutional: She appears well-developed and well-nourished. No distress.  Cardiovascular: Normal rate, regular rhythm and intact distal pulses.   Pulmonary/Chest: Effort normal. No respiratory distress.  Musculoskeletal: Normal range of motion.  The area does show some small punctures and scratches, but does not appear to be infected. There is no drainage, or warmth or induration of the area.   Neurological: She is alert.  Skin: Skin is warm and dry.  Psychiatric: She has a normal mood and affect. Her behavior is normal. Judgment and thought content normal.      Assessment:   1.  Acquired absence of bilateral breasts and nipples          Plan:   Dr. Kelly Splinter saw  the patient and recommended she continue her Cipro course and that we can proceed with surgery next week. It the area changes any or shows any sign of worsening she will let us know prior to surgery. We will plan for left latissimus/expander placement.      Medications Ordered This Encounter      ciprofloxacin (CIPRO) 500 MG tablet 21 tablet 0 09/24/2013      Take 1 tablet (500 mg total) by mouth 2 times daily. - Oral

## 2013-09-29 NOTE — Op Note (Signed)
DATE OF OPERATION: 09/29/2013  PREOPERATIVE DIAGNOSIS: Breast Cancer s/p bilateral mastectomies   POSTOPERATIVE DIAGNOSIS: Same  PROCEDURE: 1. Latissimus flap on left to reconstruct the breast CPT 19361 2. Tissue expander placement CPT 19357  SURGEON: Alan Ripper Sanger  ASSISTANT: Dr. Irean Hong Thimmappa  EBL: 200 cc  SPECIMEN: None  DRAINS: 3 total 19 blake round drains  CONDITION: Stable  COMPLICATIONS: None  INDICATION: The patient, Jane Gutierrez, is a 57 y.o. female born on 12-16-1956, is here for treatment after a mastectomy.    PROCEDURE DETAILS:  Patient was seen on the morning of her surgery and marked out for her flap. She was then taken to the operating room and given a general anesthetic. She was given an IV and IV antibiotics. An adequate time out was done. She has SCD's and a foley catheter placed. She was placed into the left lateral decubitus position with all key points padded. She was then prepped and draped in the standard sterile fashion using a chloroprep. The paddle design and position was confirmed. The procedure began by incising the margins of the paddle and dissecting out until all four margins of the muscle were identified. The muscle was then released inferiorly and anteriorly and care was taken not to pick up the serratus anteriorly or the paraspinous muscles posteriorly. The flap was then raised to the scapula and released and rotated medially. Care was taken to protect the vascular pedicle throughout this portion of the procedure. The paddle and muscle looked healthy throughout. I then opened the old mastectomy scar and raised the flaps on top of the pectoralis muscle as there was a reasonable amount of skin. The posterior and anterior pockets were then connected in the plane above the muscle. The muscle from the back and the skin paddle were then rotated into the chest pocket and tacked in place with staples and covered with an ioban dressing. The flap pedicle was inspected  and there was no tension. The back pocket was hemostased and Evicel placed. Two #19 blake round drains were place and secured with 3-0 Silk. The back incision was closed with buried 3-0 Vicryl, followed by 4-0 Monocryl and 5-0 Monocryl.  Dermabond and a protective dressing was applied.   The patient was then repositioned onto her back and the chest was prepped and draped with a betadine. The breast pocket was inspected and hemostases was achieved with electrocautery. The muscle was then secured superiorly to the skin with with 3-0 Monocryl.  Xeroform was placed under the stitch at the skin level.  A 350 cc expander was chosen. It was soaked in triple antibiotic solution and evacuated of air and then filled with 100 cc of sterile saline. The inferior portion of the muscle was tacked to the inframammary fold with 3-0 Monocryl.  One drain was placed on this side and secured with 3-0 Silk. The flap was then closed with 3-0 Monocryl deep, followed by 4-0 Monocryl and the skin closed with 5-0 Monocryl.  Dermabond, ABDs and a breast binder was applied.    The patient was allowed to wake up and taken to recovery room in stable condition at the end of the case. The family was notified at the end of the case as well.

## 2013-09-29 NOTE — Anesthesia Postprocedure Evaluation (Signed)
Anesthesia Post Note  Patient: Jane Gutierrez  Procedure(s) Performed: Procedure(s) (LRB): LATISSIMUS FLAP TO BREAST WITH TISSUE EXPANDER PLACEMENT FOR LEFT BREAST RECONSTRUCTION (Left) TISSUE EXPANDER (Left)  Anesthesia type: general  Patient location: PACU  Post pain: Pain level controlled  Post assessment: Patient's Cardiovascular Status Stable  Last Vitals:  Filed Vitals:   09/29/13 1552  BP:   Pulse:   Temp: 36.5 C  Resp:     Post vital signs: Reviewed and stable  Level of consciousness: sedated  Complications: No apparent anesthesia complications

## 2013-09-29 NOTE — Interval H&P Note (Signed)
History and Physical Interval Note:  09/29/2013 12:15 PM  Jane Gutierrez  has presented today for surgery, with the diagnosis of HISTORY OF BREAST CANCER, ACQUIRED ABSENCE OF BREAST  The various methods of treatment have been discussed with the patient and family. After consideration of risks, benefits and other options for treatment, the patient has consented to  Procedure(s): LATISSIMUS FLAP TO BREAST WITH TISSUE EXPANDER PLACEMENT FOR LEFT BREAST RECONSTRUCTION (Left) TISSUE EXPANDER (Left) as a surgical intervention .  The patient's history has been reviewed, patient examined, no change in status, stable for surgery.  I have reviewed the patient's chart and labs.  Questions were answered to the patient's satisfaction.     SANGER,Rishabh Rinkenberger

## 2013-09-29 NOTE — Anesthesia Preprocedure Evaluation (Signed)
Anesthesia Evaluation  Patient identified by MRN, date of birth, ID band Patient awake    Reviewed: Allergy & Precautions, H&P , NPO status , Patient's Chart, lab work & pertinent test results  Airway Mallampati: I TM Distance: >3 FB Neck ROM: full    Dental   Pulmonary asthma ,          Cardiovascular Rhythm:regular Rate:Normal     Neuro/Psych PSYCHIATRIC DISORDERS    GI/Hepatic   Endo/Other  Hypothyroidism   Renal/GU      Musculoskeletal   Abdominal   Peds  Hematology   Anesthesia Other Findings   Reproductive/Obstetrics                           Anesthesia Physical Anesthesia Plan  ASA: II  Anesthesia Plan: General   Post-op Pain Management:    Induction: Intravenous  Airway Management Planned: Oral ETT  Additional Equipment:   Intra-op Plan:   Post-operative Plan: Extubation in OR  Informed Consent: I have reviewed the patients History and Physical, chart, labs and discussed the procedure including the risks, benefits and alternatives for the proposed anesthesia with the patient or authorized representative who has indicated his/her understanding and acceptance.     Plan Discussed with: CRNA, Anesthesiologist and Surgeon  Anesthesia Plan Comments:         Anesthesia Quick Evaluation

## 2013-09-29 NOTE — Brief Op Note (Signed)
09/29/2013  3:45 PM  PATIENT:  Jane Gutierrez  57 y.o. female  PRE-OPERATIVE DIAGNOSIS:  HISTORY OF BREAST CANCER, ACQUIRED ABSENCE OF LEFT BREAST SECONDARY TO BREAST CANCER TREATMENT  POST-OPERATIVE DIAGNOSIS:  HISTORY OF BREAST CANCER, ACQUIRED ABSENCE OF LEFT BREAST SECONDARY TO BREAST CANCER TREATMENT  PROCEDURE:  Procedure(s): LATISSIMUS FLAP TO BREAST WITH TISSUE EXPANDER PLACEMENT FOR LEFT BREAST RECONSTRUCTION (Left) TISSUE EXPANDER (Left)  SURGEON:  Surgeon(s) and Role:    * Claire Sanger, DO - Primary    * Glenna Fellows, MD - Assisting  PHYSICIAN ASSISTANT: none  ASSISTANTS: Dr. Glenna Fellows   ANESTHESIA:   general  EBL:  Total I/O In: 2000 [I.V.:2000] Out: 205 [Urine:130; Blood:75]  BLOOD ADMINISTERED:none  DRAINS: (3) Jackson-Pratt drain(s) with closed bulb suction in the one in the left breast pocket and two in the back.   LOCAL MEDICATIONS USED:  NONE  SPECIMEN:  Source of Specimen:  mastectomy scar  DISPOSITION OF SPECIMEN:  PATHOLOGY  COUNTS:  YES  TOURNIQUET:  * No tourniquets in log *  DICTATION: .Dragon Dictation  PLAN OF CARE: Admit to inpatient   PATIENT DISPOSITION:  PACU - hemodynamically stable.   Delay start of Pharmacological VTE agent (>24hrs) due to surgical blood loss or risk of bleeding: no

## 2013-09-30 MED ORDER — HYDROCODONE-ACETAMINOPHEN 5-325 MG PO TABS
1.0000 | ORAL_TABLET | ORAL | Status: DC | PRN
  Administered 2013-09-30 – 2013-10-01 (×6): 1 via ORAL
  Filled 2013-09-30 (×3): qty 2
  Filled 2013-09-30 (×4): qty 1

## 2013-09-30 MED ORDER — CIPROFLOXACIN HCL 500 MG PO TABS
500.0000 mg | ORAL_TABLET | Freq: Two times a day (BID) | ORAL | Status: DC
  Administered 2013-09-30 – 2013-10-01 (×3): 500 mg via ORAL
  Filled 2013-09-30 (×8): qty 1

## 2013-09-30 NOTE — Progress Notes (Signed)
JP's drains were not labeled post-operatively. JP drains now labeled on patient for documentation purposes.

## 2013-09-30 NOTE — Progress Notes (Signed)
Nutrition Brief Note  Patient identified on the Malnutrition Screening Tool (MST) Report  Wt Readings from Last 15 Encounters:  09/29/13 157 lb 6.5 oz (71.4 kg)  09/29/13 157 lb 6.5 oz (71.4 kg)  09/23/13 157 lb 6.4 oz (71.396 kg)  09/21/13 156 lb 4.9 oz (70.9 kg)  09/09/13 159 lb 6.4 oz (72.303 kg)  09/02/13 157 lb 3.2 oz (71.305 kg)  08/12/13 156 lb 1.6 oz (70.806 kg)  07/22/13 156 lb 8 oz (70.988 kg)  07/02/13 155 lb 4.8 oz (70.444 kg)  06/16/13 156 lb (70.761 kg)  06/16/13 156 lb (70.761 kg)  06/10/13 157 lb 1.6 oz (71.26 kg)  06/03/13 157 lb 14.4 oz (71.623 kg)  05/27/13 159 lb 12.8 oz (72.485 kg)  05/26/13 155 lb 8 oz (70.534 kg)    Body mass index is 26.23 kg/(m^2). Patient meets criteria for Overweight based on current BMI. Pt reports losing 10-15 lbs during chemotherapy treatment but, she has been able to maintain her weight recently.  Current diet order is Regular, patient is consuming approximately 75% of meals at this time. She states her appetite is good and she is eating well. Labs and medications reviewed.   No nutrition interventions warranted at this time. If nutrition issues arise, please consult RD.   Ian Malkin RD, LDN Inpatient Clinical Dietitian Pager: [phone] After Hours Pager: [phone]

## 2013-09-30 NOTE — Progress Notes (Signed)
1 Day Post-Op  Subjective: Jane Gutierrez is feeling better this morning with decreased complaint of pain and less nausea with Morphine PCA. She would like to try and switch to oral medications today.  The left breast incisions are clean, dry and intact and the stay sutures are intact. There are no signs of infection. The back incision is clean, dry and intact. JP are draining minimal serosang drainage.   Objective: Vital signs in last 24 hours: Temp:  [97.7 F (36.5 C)-98.6 F (37 C)] 98.6 F (37 C) (10/09 1018) Pulse Rate:  [64-86] 84 (10/09 1018) Resp:  [12-22] 17 (10/09 1018) BP: (96-137)/(54-83) 107/63 mmHg (10/09 1018) SpO2:  [96 %-100 %] 97 % (10/09 1018) Weight:  [71.4 kg (157 lb 6.5 oz)] 71.4 kg (157 lb 6.5 oz) (10/08 1750) Last BM Date: 09/28/13  Intake/Output from previous day: 10/08 0701 - 10/09 0700 In: 4265 [I.V.:4200] Out: 350 [Urine:130; Drains:145; Blood:75] Intake/Output this shift: Total I/O In: 120 [P.O.:120] Out: 25 [Drains:25]  General appearance: alert, cooperative and mild distress Resp: clear to auscultation bilaterally Cardio: regular rate and rhythm The left breast  incisions are clean, dry and intact and the stay sutures are intact. There are no signs of infection. The back incision is clean, dry and intact. JP are draining minimal serosang drainage.     Lab Results:   Recent Labs  09/29/13 2008  WBC 9.0  HGB 12.5  HCT 37.2  PLT 166   BMET  Recent Labs  09/29/13 2008  CREATININE 0.51   PT/INR No results found for this basename: LABPROT, INR,  in the last 72 hours ABG No results found for this basename: PHART, PCO2, PO2, HCO3,  in the last 72 hours  Studies/Results: No results found.  Anti-infectives: Anti-infectives   Start     Dose/Rate Route Frequency Ordered Stop   09/30/13 1045  ciprofloxacin (CIPRO) tablet 500 mg     500 mg Oral 2 times daily 09/30/13 1036     09/29/13 1900  ciprofloxacin (CIPRO) IVPB 400 mg  Status:  Discontinued      400 mg 200 mL/hr over 60 Minutes Intravenous Every 12 hours 09/29/13 1753 09/30/13 1036   09/29/13 1441  polymyxin B 500,000 Units, bacitracin 50,000 Units in sodium chloride irrigation 0.9 % 500 mL irrigation  Status:  Discontinued       As needed 09/29/13 1441 09/29/13 1546   09/29/13 0600  ciprofloxacin (CIPRO) IVPB 400 mg     400 mg 200 mL/hr over 60 Minutes Intravenous On call to O.R. 09/28/13 1444 09/29/13 1226      Assessment/Plan: s/p Procedure(s): LATISSIMUS FLAP TO BREAST WITH TISSUE EXPANDER PLACEMENT FOR LEFT BREAST RECONSTRUCTION (Left) TISSUE EXPANDER (Left) Doing well POD #1 Continue antibiotics Change over to oral medications for pain and add Robaxin for muscle spasms  Mobilize and ambulate today.    LOS: 1 day    Franki Monte 09/30/2013 Plastic Surgery [phone]

## 2013-10-01 ENCOUNTER — Encounter (HOSPITAL_COMMUNITY): Payer: Self-pay | Admitting: Plastic Surgery

## 2013-10-01 MED ORDER — METHOCARBAMOL 500 MG PO TABS: 500.0000 mg | ORAL_TABLET | Freq: Four times a day (QID) | ORAL | Status: DC | PRN

## 2013-10-01 MED ORDER — HYDROCODONE-ACETAMINOPHEN 5-325 MG PO TABS: 1.0000 | ORAL_TABLET | ORAL | Status: DC | PRN

## 2013-10-01 MED ORDER — DIAZEPAM 2 MG PO TABS: 2.0000 mg | ORAL_TABLET | Freq: Two times a day (BID) | ORAL | Status: DC | PRN

## 2013-10-01 MED ORDER — DSS 100 MG PO CAPS: 100.0000 mg | ORAL_CAPSULE | Freq: Three times a day (TID) | ORAL | Status: DC

## 2013-10-01 NOTE — Discharge Summary (Signed)
Physician Discharge Summary  Patient ID: Jane Gutierrez MRN: [id number] DOB/AGE: Oct 02, 1956 57 y.o.  Admit date: 09/29/2013 Discharge date: 10/01/2013  Admission Diagnoses: History of infection left breast tissue expander  Discharge Diagnoses:  Active Problems:   * No active hospital problems. *   Discharged Condition: good  Hospital Course: The patient is a 57 yrs old wf admitted for left breast reconstruction with latissimus muscle flap and expander placement next week. History:   She underwent bilateral mastectomies with expander and ADM placement. She then had chemotherapy and had recurrent seroma formation and infection of the left breast.  After lengthy discussions the decision was made to remove the left expander and do an autologous reconstruction when she is finished with the chemotherapy. Originally, a routine screening mammogram followed by an MRI showed a lesion of the left breast with a biopsy-proven DCIS. The right breast showed an irregular enhancing area. She is 5 feet 5 inches tall and weighs 165 pounds. Her bra was a 36 B-C and she wanted to be around a B. She stopped Chemotherapy for cardiac reasons.    She was admitted 09/29/2013 and underwent a left breast reconstruction with latissimus musculocutaneous flap and left breast tissue expander placement per Dr. Kelly Splinter and Dr. Leta Baptist. She did very well post operatively and is stable for discharge.    Consults: None  Significant Diagnostic Studies: None  Treatments: surgery: PROCEDURE: 1. Latissimus flap on left to reconstruct the breast CPT 19361 2. Tissue expander placement CPT 19357  SURGEON: Alan Ripper Sanger  ASSISTANT: Dr. Glenna Fellows  A 350 cc expander was chosen. It was soaked in triple antibiotic solution and evacuated of air and then filled with 100 cc of sterile saline   Discharge Exam: Blood pressure 101/58, pulse 93, temperature 98.6 F (37 C), temperature source Oral, resp. rate 18, height 5' 4.96"  (1.65 m), weight 71.4 kg (157 lb 6.5 oz), SpO2 94.00%. General appearance: alert, cooperative, appears stated age and no distress Resp: clear to auscultation bilaterally Cardio: regular rate and rhythm The left breast-  The left breast flap is viable. There is a small area of bruising over the medial aspect of the flap. No drainage, and the incisions are clean, dry and intact and without signs of infection.   Disposition: 01-Home or Self Care      Discharge Orders   Future Appointments Provider Department Dept Phone   10/14/2013 9:45 AM Krista Blue [facility] MEDICAL ONCOLOGY [phone]   10/14/2013 10:15 AM Victorino December, MD [facility] MEDICAL ONCOLOGY [phone]   10/14/2013 11:15 AM [facility] [phone]   11/04/2013 11:15 AM Beverely Pace [facility] [phone]   11/04/2013 11:45 AM Victorino December, MD [facility] [phone]   11/04/2013 12:45 PM [facility] MEDICAL ONCOL[facility]Y [phone]   Future Orders Complete By Expires   Care order/instruction  As directed    Comments:     Please give patient sheet to record drain volumes       Medication List         albuterol 108 (90 BASE) MCG/ACT inhaler  Commonly known as:  PROVENTIL HFA;VENTOLIN HFA  Inhale 2 puffs into the lungs every 6 (six) hours as needed for shortness of breath.     aspirin 81 MG tablet  Take 81 mg by mouth daily.     B-complex with vitamin C  tablet  Take 1 tablet by mouth daily.     ciprofloxacin 500 MG tablet  Commonly known as:  CIPRO  Take 500 mg by mouth 2 (two) times daily.     dextroamphetamine 5 MG tablet  Commonly known as:  DEXTROSTAT  Take 2.5 mg by mouth 3 (three) times daily.     diazepam 2 MG tablet  Commonly known as:  VALIUM  Take 1 tablet (2 mg total) by mouth every 12 (twelve) hours as needed.      DSS 100 MG Caps  Take 100 mg by mouth 3 (three) times daily.     EPIPEN 0.3 mg/0.3 mL Devi  Generic drug:  EPINEPHrine  Inject 0.3 mg into the muscle as needed (for allergic reaction).     eszopiclone 2 MG Tabs tablet  Commonly known as:  LUNESTA  Take 1 tablet (2 mg total) by mouth at bedtime. Take immediately before bedtime     HYDROcodone-acetaminophen 5-325 MG per tablet  Commonly known as:  NORCO/VICODIN  Take 1 tablet by mouth every 4 (four) hours as needed.     letrozole 2.5 MG tablet  Commonly known as:  FEMARA  Take 1 tablet (2.5 mg total) by mouth daily.     levothyroxine 100 MCG tablet  Commonly known as:  SYNTHROID, LEVOTHROID  Take 100 mcg by mouth daily.     lidocaine-prilocaine cream  Commonly known as:  EMLA  Apply 1 application topically as needed (for port-a-cath access).     loratadine 10 MG tablet  Commonly known as:  CLARITIN  Take 10 mg by mouth daily as needed (for 5 days after Neulasta injection).     LORazepam 1 MG tablet  Commonly known as:  ATIVAN  Take 1 tablet (1 mg total) by mouth every 8 (eight) hours as needed for anxiety.     methocarbamol 500 MG tablet  Commonly known as:  ROBAXIN  Take 1 tablet (500 mg total) by mouth every 6 (six) hours as needed.     multivitamin with minerals Tabs tablet  Take 1 tablet by mouth daily.     sennosides-docusate sodium 8.6-50 MG tablet  Commonly known as:  SENOKOT-S  Take 1 tablet by mouth daily.     vitamin C 500 MG tablet  Commonly known as:  ASCORBIC ACID  Take 500 mg by mouth daily.     vitamin E 400 UNIT capsule  Generic drug:  vitamin E  Take 800 Units by mouth daily.       Follow-up Information   Follow up with [facility], DO On 10/05/2013.   Specialty:  Plastic Surgery   Contact information:   [address] [place] [address] [phone]       Signed: Franki Monte 10/01/2013, 8:07 AM  Plastic Surgery [phone]

## 2013-10-01 NOTE — Progress Notes (Signed)
Discharge instructions reviewed with pt and prescriptions given.  Pt stated she knew how to empty and record JP drainage.  Pt verbalized understanding and had no questions.  Pt discharged in stable condition via wheelchair with sister.  Jane Gutierrez

## 2013-10-01 NOTE — Progress Notes (Signed)
2 Days Post-Op  Subjective: Jane Gutierrez is doing very well this am. Her pain is under good control with Norco and Robaxin. She is afebrile and ambulatory.  The left breast flap is viable. There is a small area of bruising over the medial aspect of the flap. No drainage, and the incisions are clean, dry and intact and without signs of infection.   Objective: Vital signs in last 24 hours: Temp:  [98.1 F (36.7 C)-98.9 F (37.2 C)] 98.6 F (37 C) (10/10 0528) Pulse Rate:  [84-97] 93 (10/10 0528) Resp:  [15-18] 18 (10/10 0528) BP: (93-107)/(56-63) 101/58 mmHg (10/10 0528) SpO2:  [93 %-100 %] 94 % (10/10 0528) Last BM Date: 09/28/13  Intake/Output from previous day: 10/09 0701 - 10/10 0700 In: 1994.7 [P.O.:1100; I.V.:894.7] Out: 159 [Drains:159] Intake/Output this shift:    General appearance: alert, cooperative and no distress Resp: clear to auscultation bilaterally Cardio: regular rate and rhythm The left breast- The left breast flap is viable. There is a small area of bruising over the medial aspect of the flap. No drainage, and the incisions are clean, dry and intact and without signs of infection.   Lab Results:   Recent Labs  09/29/13 2008  WBC 9.0  HGB 12.5  HCT 37.2  PLT 166   BMET  Recent Labs  09/29/13 2008  CREATININE 0.51   PT/INR No results found for this basename: LABPROT, INR,  in the last 72 hours ABG No results found for this basename: PHART, PCO2, PO2, HCO3,  in the last 72 hours  Studies/Results: No results found.  Anti-infectives: Anti-infectives   Start     Dose/Rate Route Frequency Ordered Stop   09/30/13 1045  ciprofloxacin (CIPRO) tablet 500 mg     500 mg Oral 2 times daily 09/30/13 1036     09/29/13 1900  ciprofloxacin (CIPRO) IVPB 400 mg  Status:  Discontinued     400 mg 200 mL/hr over 60 Minutes Intravenous Every 12 hours 09/29/13 1753 09/30/13 1036   09/29/13 1441  polymyxin B 500,000 Units, bacitracin 50,000 Units in sodium chloride  irrigation 0.9 % 500 mL irrigation  Status:  Discontinued       As needed 09/29/13 1441 09/29/13 1546   09/29/13 0600  ciprofloxacin (CIPRO) IVPB 400 mg     400 mg 200 mL/hr over 60 Minutes Intravenous On call to O.R. 09/28/13 1444 09/29/13 1226      Assessment/Plan: s/p Procedure(s): LATISSIMUS FLAP TO BREAST WITH TISSUE EXPANDER PLACEMENT FOR LEFT BREAST RECONSTRUCTION (Left) TISSUE EXPANDER (Left) Discharge today follow up in the office on Tuesday next week Continue antibiotics at home  LOS: 2 days    Franki Monte  10/01/2013 Plastic Surgery [phone]

## 2013-10-02 NOTE — Discharge Summary (Signed)
Agree with the above

## 2013-10-02 NOTE — Progress Notes (Signed)
Agree with the above

## 2013-10-14 ENCOUNTER — Telehealth: Payer: Self-pay | Admitting: *Deleted

## 2013-10-14 ENCOUNTER — Ambulatory Visit (HOSPITAL_BASED_OUTPATIENT_CLINIC_OR_DEPARTMENT_OTHER): Payer: 59

## 2013-10-14 ENCOUNTER — Encounter: Payer: Self-pay | Admitting: Oncology

## 2013-10-14 ENCOUNTER — Other Ambulatory Visit (HOSPITAL_BASED_OUTPATIENT_CLINIC_OR_DEPARTMENT_OTHER): Payer: 59 | Admitting: Lab

## 2013-10-14 ENCOUNTER — Ambulatory Visit (HOSPITAL_BASED_OUTPATIENT_CLINIC_OR_DEPARTMENT_OTHER): Payer: 59 | Admitting: Oncology

## 2013-10-14 VITALS — BP 116/77 | HR 92 | Temp 97.6°F | Resp 18 | Ht 64.96 in | Wt 159.6 lb

## 2013-10-14 DIAGNOSIS — Z170 Estrogen receptor positive status [ER+]: Secondary | ICD-10-CM

## 2013-10-14 DIAGNOSIS — IMO0001 Reserved for inherently not codable concepts without codable children: Secondary | ICD-10-CM

## 2013-10-14 DIAGNOSIS — C50419 Malignant neoplasm of upper-outer quadrant of unspecified female breast: Secondary | ICD-10-CM

## 2013-10-14 DIAGNOSIS — Z5112 Encounter for antineoplastic immunotherapy: Secondary | ICD-10-CM

## 2013-10-14 DIAGNOSIS — M2550 Pain in unspecified joint: Secondary | ICD-10-CM

## 2013-10-14 DIAGNOSIS — D0512 Intraductal carcinoma in situ of left breast: Secondary | ICD-10-CM

## 2013-10-14 LAB — COMPREHENSIVE METABOLIC PANEL (CC13)
Alkaline Phosphatase: 52 U/L (ref 40–150)
Anion Gap: 9 mEq/L (ref 3–11)
BUN: 25.4 mg/dL (ref 7.0–26.0)
CO2: 26 mEq/L (ref 22–29)
Calcium: 9.2 mg/dL (ref 8.4–10.4)
Glucose: 82 mg/dl (ref 70–140)
Sodium: 139 mEq/L (ref 136–145)
Total Bilirubin: 0.29 mg/dL (ref 0.20–1.20)
Total Protein: 7 g/dL (ref 6.4–8.3)

## 2013-10-14 LAB — CBC WITH DIFFERENTIAL/PLATELET
Basophils Absolute: 0 10*3/uL (ref 0.0–0.1)
Eosinophils Absolute: 0.4 10*3/uL (ref 0.0–0.5)
HCT: 38 % (ref 34.8–46.6)
HGB: 12.7 g/dL (ref 11.6–15.9)
LYMPH%: 30.5 % (ref 14.0–49.7)
MCH: 26.8 pg (ref 25.1–34.0)
MCV: 80.3 fL (ref 79.5–101.0)
MONO#: 0.4 10*3/uL (ref 0.1–0.9)
MONO%: 6.6 % (ref 0.0–14.0)
NEUT#: 3.6 10*3/uL (ref 1.5–6.5)
NEUT%: 56.3 % (ref 38.4–76.8)
Platelets: 322 10*3/uL (ref 145–400)
RBC: 4.73 10*6/uL (ref 3.70–5.45)
RDW: 14.8 % — ABNORMAL HIGH (ref 11.2–14.5)
WBC: 6.4 10*3/uL (ref 3.9–10.3)

## 2013-10-14 MED ORDER — ACETAMINOPHEN 325 MG PO TABS
ORAL_TABLET | ORAL | Status: AC
  Filled 2013-10-14: qty 2

## 2013-10-14 MED ORDER — HEPARIN SOD (PORK) LOCK FLUSH 100 UNIT/ML IV SOLN
500.0000 [IU] | Freq: Once | INTRAVENOUS | Status: DC | PRN
  Filled 2013-10-14: qty 5

## 2013-10-14 MED ORDER — TRASTUZUMAB CHEMO INJECTION 440 MG
6.0000 mg/kg | Freq: Once | INTRAVENOUS | Status: AC
  Administered 2013-10-14: 420 mg via INTRAVENOUS
  Filled 2013-10-14: qty 20

## 2013-10-14 MED ORDER — ACETAMINOPHEN 325 MG PO TABS
650.0000 mg | ORAL_TABLET | Freq: Once | ORAL | Status: AC
  Administered 2013-10-14: 650 mg via ORAL

## 2013-10-14 MED ORDER — SODIUM CHLORIDE 0.9 % IV SOLN: Freq: Once | INTRAVENOUS | Status: DC

## 2013-10-14 MED ORDER — SODIUM CHLORIDE 0.9 % IJ SOLN
10.0000 mL | INTRAMUSCULAR | Status: DC | PRN
  Filled 2013-10-14: qty 10

## 2013-10-14 NOTE — Telephone Encounter (Signed)
Per staff message and POF I have scheduled appts.  JMW  

## 2013-10-14 NOTE — Progress Notes (Signed)
[facility]  [phone] Fax:([phone]) [phone]  OFFICE PROGRESS NOTE   CC: [name], MD [address] [place] [address] Dr. Emelia Loron Dr. Chipper Herb Dr. Starleen Arms Dr. Dorothy Puffer   DIAGNOSIS: 57 year old female who presented on 12/24/2012 which new diagnosis of invasive ductal carcinoma, ER positive, PR positive, HER-2/neu positive in the left breast.     PRIOR THERAPY: #1  The patient has multiple medical problems but recently she had a mammogram performed that showed a 5.4 cm area of calcifications in the left breast.  MRI of the breasts showed the area in her left breast in the upper outer quadrant extended to about 7.2 cm. Needle core biopsy of the left breast showed a high-grade DCIS that was ER positive PR positive.  #2  She was seen by Dr. Emelia Loron regarding surgical options. The patient wanted to have bilateral mastectomies.  She is now status post bilateral mastectomy on 12/31/2012. The final pathology did reveal a 2.3 cm invasive ductal carcinoma in the left breast. The tumor was ER +100%, PR +86%,  HER-2/neu was amplified with a ratio of 6.81, Ki-67 was elevated at 46%. She is in the process of completing reconstruction.  #3 The patient was seen again in medical oncology for discussion of adjuvant treatment. Since her tumor was HER-2/neu positive, we recommended anti-HER-2/neu- based therapy consisting of Taxotere/Carboplatinum/Herceptin. The Taxotere and Carboplatinum was given every 3 weeks for 5 of 6 cycles. She received Neulasta on day 2. Herceptin was given weekly during the duration of chemotherapy and then every 3 weeks to finish out a one-year of treatment. She received systemic therapy with TCH from 02/04/2013 through 05/13/2013.  #4  The patient will continue Herceptin every 3 weeks to finish out 1 year.  #5  She began antiestrogen therapy with Letrozole 2.5 mg daily starting 06/03/2013 with a total of 5 years  of therapy planned at this time.   CURRENT THERAPY:  Q3 weekly Herceptin therapy.  Antiestrogen therapy with Letrozole at 2.5 mg by mouth daily.  INTERVAL HISTORY:   Ms. Rennie Plowman is doing well today.  She is here for evaluation prior to adjuvant q3week Herceptin therapy.  y.  She is taking Letrozole daily and continues to have joint aches.    She had her reconstruction performed. She is not having any problems  She denies fevers, chills, shortness of breath, DOE, palpitations, orthopnea, or lower extremity swelling.   Remainder of the 10 point review of systems is negative.   MEDICAL HISTORY: Past Medical History  Diagnosis Date  . IBS (irritable bowel syndrome)   . ADD (attention deficit disorder)   . Lyme disease   . Contact lens/glasses fitting     wears contacts or glasses  . Allergy   . Hypothyroidism   . Tricuspid regurgitation     "from the chemo" (09/29/2013)  . Exertional shortness of breath     "probably from the Cerpetiian I'm taking" (09/29/2013)  . Arthritis     "hands" (09/29/2013)  . Breast cancer 12/04/12    left, ER/PR +  . Cervical cancer     ALLERGIES:  is allergic to betadine; iodine; shellfish-derived products; sulfamethoxazole-trimethoprim; cleocin; and penicillins.  MEDICATIONS:  Current Outpatient Prescriptions  Medication Sig Dispense Refill  . albuterol (PROVENTIL HFA;VENTOLIN HFA) 108 (90 BASE) MCG/ACT inhaler Inhale 2 puffs into the lungs every 6 (six) hours as needed for shortness of breath.       [name] aspirin 81 MG tablet Take  81 mg by mouth daily.      . B Complex-C (B-COMPLEX WITH VITAMIN C) tablet Take 1 tablet by mouth daily.      [name] dextroamphetamine (DEXTROSTAT) 5 MG tablet Take 2.5 mg by mouth 3 (three) times daily.       . diazepam (VALIUM) 2 MG tablet Take 1 tablet (2 mg total) by mouth every 12 (twelve) hours as needed.  30 tablet  0  . eszopiclone (LUNESTA) 2 MG TABS Take 1 tablet (2 mg total) by mouth at bedtime. Take immediately before bedtime   30 tablet  5  . letrozole (FEMARA) 2.5 MG tablet Take 1 tablet (2.5 mg total) by mouth daily.  90 tablet  6  . levothyroxine (SYNTHROID, LEVOTHROID) 100 MCG tablet Take 100 mcg by mouth daily.      [name] lidocaine-prilocaine (EMLA) cream Apply 1 application topically as needed (for port-a-cath access).  30 g  2  . loratadine (CLARITIN) 10 MG tablet Take 10 mg by mouth daily as needed (for 5 days after Neulasta injection).      . LORazepam (ATIVAN) 1 MG tablet Take 1 tablet (1 mg total) by mouth every 8 (eight) hours as needed for anxiety.  60 tablet  0  . Multiple Vitamin (MULTIVITAMIN WITH MINERALS) TABS Take 1 tablet by mouth daily.      . vitamin C (ASCORBIC ACID) 500 MG tablet Take 500 mg by mouth daily.      . vitamin E (VITAMIN E) 400 UNIT capsule Take 800 Units by mouth daily.      [name] EPINEPHrine (EPIPEN) 0.3 mg/0.3 mL DEVI Inject 0.3 mg into the muscle as needed (for allergic reaction).        No current facility-administered medications for this visit.    SURGICAL HISTORY:  Past Surgical History  Procedure Laterality Date  . Brain surgery      removal benign frontal lobe cyst in 1987  . Foot surgery Bilateral 2013    "toes were webbed together; had them separated" (09/29/2013)  . Gynecologic cryosurgery      cervical cancer, 1998 last remained disease free with recent normal pap  . Diagnostic laparoscopy  2004    lysis adh-uterine ablation  . Breast enhancement surgery Bilateral ~ 2008  . Breast surgery Left     removal benign left breast lesion  . Facial cosmetic surgery  ~ 2010  . Axillary sentinel node biopsy  12/31/2012    Procedure: AXILLARY SENTINEL NODE BIOPSY;  Surgeon: Emelia Loron, MD;  Location: [facility];  Service: General;  Laterality: Bilateral;  bilateral sentinel node  . Total mastectomy  12/31/2012    Procedure: TOTAL MASTECTOMY;  Surgeon: Emelia Loron, MD;  Location: [facility];  Service: General;  Laterality: Bilateral;   bilateral total mastectomies  . Breast reconstruction with placement of tissue expander and flex hd (acellular hydrated dermis)  12/31/2012    Procedure: BREAST RECONSTRUCTION WITH PLACEMENT OF TISSUE EXPANDER AND FLEX HD (ACELLULAR HYDRATED DERMIS);  Surgeon: Wayland Denis, DO;  Location: [facility];  Service: Plastics;  Laterality: Bilateral;  bilateral immediate breast reconstruction with expanders and flex hd   . Portacath placement  01/27/2013    Procedure: INSERTION PORT-A-CATH;  Surgeon: Emelia Loron, MD;  Location: [facility];  Service: General;  Laterality: Right;  . Incision and drainage of wound Left 04/28/2013    Procedure: IRRIGATION AND DEBRIDEMENT LEFT BREAST WOUND, POSSIBLE CLOSURE OF WOUND WITH DRAIN PLACEMENT;  Surgeon:  Claire Sanger, DO;  Location: [facility];  Service: Plastics;  Laterality: Left;  . Tee without cardioversion N/A 06/04/2013    Procedure: TRANSESOPHAGEAL ECHOCARDIOGRAM (TEE);  Surgeon: Dolores Patty, MD;  Location: [facility];  Service: Cardiovascular;  Laterality: N/A;  . Tissue expander placement Left 06/16/2013    Procedure: LEFT BREAST TISSUE EXPANDER REMOVAL;  Surgeon: Wayland Denis, DO;  Location: [facility];  Service: Plastics;  Laterality: Left;  . Reconstruction breast w/ latissimus dorsi flap Left 09/29/2013  . Tissue expander placement Left 09/29/2013  . Abdominal hysterectomy  2005    TAH/BSO-lysis adh, enometriosis  . Dilation and curettage of uterus  2005  . Cervical cerclage    . Latissimus flap to breast Left 09/29/2013    Procedure: LATISSIMUS FLAP TO BREAST WITH TISSUE EXPANDER PLACEMENT FOR LEFT BREAST RECONSTRUCTION;  Surgeon: Wayland Denis, DO;  Location: [facility];  Service: Plastics;  Laterality: Left;  . Tissue expander placement Left 09/29/2013    Procedure: TISSUE EXPANDER;  Surgeon: Wayland Denis, DO;  Location: [facility];  Service: Plastics;  Laterality: Left;    REVIEW OF  SYSTEMS:  A 10 point review of systems was completed and is negative except for ongoing arthralgias/myalgias particular in her hips bilaterally.  The review of systems is otherwise noncontributory.   PHYSICAL EXAMINATION: Blood pressure 116/77, pulse 92, temperature 97.6 F (36.4 C), temperature source Oral, resp. rate 18, height 5' 4.96" (1.65 m), weight 159 lb 9.6 oz (72.394 kg). Body mass index is 26.59 kg/(m^2). General: Patient is a well appearing female in no acute distress HEENT: PERRLA, sclerae anicteric no conjunctival pallor, MMM Neck: supple, no palpable adenopathy Lungs: clear to auscultation bilaterally, no wheezes, rhonchi, or rales Cardiovascular: regular rate rhythm, S1, S2, no murmurs, rubs or gallops Abdomen: Soft, non-tender, non-distended, normoactive bowel sounds, no HSM Extremities: warm and well perfused, no clubbing, cyanosis, or edema Skin: No rashes or lesions, healing puncture wound on right shin, no surrounding erythema, warmth, or drainage.   Neuro: Non-focal Breasts, left mastectomy site non-erythematous, well healed, no nodularity. ECOG PERFORMANCE STATUS: 1 - Symptomatic but completely ambulatory   LABORATORY DATA: Lab Results  Component Value Date   WBC 6.4 10/14/2013   HGB 12.7 10/14/2013   HCT 38.0 10/14/2013   MCV 80.3 10/14/2013   PLT 322 10/14/2013      Chemistry      Component Value Date/Time   NA 140 09/23/2013 1346   NA 137 09/21/2013 0842   K 3.7 09/23/2013 1346   K 4.4 09/21/2013 0842   CL 101 09/21/2013 0842   CL 104 06/10/2013 1312   CO2 24 09/23/2013 1346   CO2 25 09/21/2013 0842   BUN 30.6* 09/23/2013 1346   BUN 26* 09/21/2013 0842   CREATININE 0.51 09/29/2013 2008   CREATININE 0.7 09/23/2013 1346      Component Value Date/Time   CALCIUM 8.9 09/23/2013 1346   CALCIUM 9.4 09/21/2013 0842   ALKPHOS 51 09/23/2013 1346   ALKPHOS 52 09/21/2013 0842   AST 26 09/23/2013 1346   AST 29 09/21/2013 0842   ALT 23 09/23/2013 1346   ALT 24 09/21/2013  0842   BILITOT 0.29 09/23/2013 1346   BILITOT 0.3 09/21/2013 0842      STUDIES: No results found.   ASSESSMENT: 57 year old [place], [place] woman:  (1) Status post bilateral mastectomies on 12/31/2012 showing:  (a) On the right, no evidence of malignancy  (b) On the left, a pT2 pN0, stage  IIA invasive ductal carcinoma, grade 2, estrogen receptor 100% positive, progesterone receptor 86% positive, with an MIB-1 of 46%, and HER-2 amplification with a CISH ratio of 6.81.  (2) Received adjuvant chemotherapy with Carboplatin/Docetaxel/Trastuzumab from 02/04/2013 through 05/13/2013 and was scheduled to consist of 6 cycles.  Unfortunately, the patient was unable to tolerate the entire course of chemotherapy and subsequently she requested that we discontinued the last cycle of her treatment which would have been cycle #6.  She received a total of 5 of 6 chemotherapy cycles.  She continues to receive every 3 week Trastuzumab (Herceptin).   (3) Currently taking adjuvant antiestrogen therapy with letters saw 2.5 mg by mouth daily since her tumor was ER positive.   (4) Ongoing arthralgias/myalgias   PLAN:   #1 Proceed with every 3 week Herceptin therapy today.  #2  The patient will return in 3 weeks' time  #3 Continue antiestrogen therapy with Letrozole 2.5 mg by mouth daily.         All questions were answered.  Ms. Rennie Plowman knows to call the clinic with any problems, questions or concerns. We can certainly see her  much sooner if necessary.The length of time of the face-to-face encounter was 25    minutes. More than 50% of time was spent counseling and coordination of care.   Drue Second, MD Medical/Oncology [facility] [phone] (beeper) [phone] (Office)  10/14/2013, 10:45 AM

## 2013-10-14 NOTE — Patient Instructions (Signed)
Proceed with herceptin  We will see you back in 3 weeks 

## 2013-10-14 NOTE — Telephone Encounter (Signed)
appts made and printed. Pt is aware that tx will be added. i emailed MW to add the tx's...td 

## 2013-11-04 ENCOUNTER — Other Ambulatory Visit (HOSPITAL_BASED_OUTPATIENT_CLINIC_OR_DEPARTMENT_OTHER): Payer: 59 | Admitting: Lab

## 2013-11-04 ENCOUNTER — Ambulatory Visit (HOSPITAL_BASED_OUTPATIENT_CLINIC_OR_DEPARTMENT_OTHER): Payer: 59

## 2013-11-04 ENCOUNTER — Ambulatory Visit (HOSPITAL_BASED_OUTPATIENT_CLINIC_OR_DEPARTMENT_OTHER): Payer: 59 | Admitting: Oncology

## 2013-11-04 VITALS — BP 116/70 | HR 76 | Temp 97.0°F | Resp 20 | Ht 64.96 in | Wt 161.3 lb

## 2013-11-04 DIAGNOSIS — Z5112 Encounter for antineoplastic immunotherapy: Secondary | ICD-10-CM

## 2013-11-04 DIAGNOSIS — Z9010 Acquired absence of unspecified breast and nipple: Secondary | ICD-10-CM

## 2013-11-04 DIAGNOSIS — C50419 Malignant neoplasm of upper-outer quadrant of unspecified female breast: Secondary | ICD-10-CM

## 2013-11-04 DIAGNOSIS — D0512 Intraductal carcinoma in situ of left breast: Secondary | ICD-10-CM

## 2013-11-04 DIAGNOSIS — R52 Pain, unspecified: Secondary | ICD-10-CM

## 2013-11-04 DIAGNOSIS — C50412 Malignant neoplasm of upper-outer quadrant of left female breast: Secondary | ICD-10-CM

## 2013-11-04 LAB — COMPREHENSIVE METABOLIC PANEL (CC13)
ALT: 31 U/L (ref 0–55)
AST: 31 U/L (ref 5–34)
Albumin: 3.4 g/dL — ABNORMAL LOW (ref 3.5–5.0)
BUN: 28.8 mg/dL — ABNORMAL HIGH (ref 7.0–26.0)
CO2: 25 mEq/L (ref 22–29)
Calcium: 9.4 mg/dL (ref 8.4–10.4)
Chloride: 105 mEq/L (ref 98–109)
Potassium: 3.8 mEq/L (ref 3.5–5.1)
Sodium: 140 mEq/L (ref 136–145)
Total Protein: 6.7 g/dL (ref 6.4–8.3)

## 2013-11-04 LAB — CBC WITH DIFFERENTIAL/PLATELET
BASO%: 0.6 % (ref 0.0–2.0)
EOS%: 1.9 % (ref 0.0–7.0)
Eosinophils Absolute: 0.1 10*3/uL (ref 0.0–0.5)
HCT: 40.6 % (ref 34.8–46.6)
MCH: 27.4 pg (ref 25.1–34.0)
MCHC: 33.7 g/dL (ref 31.5–36.0)
MONO#: 0.5 10*3/uL (ref 0.1–0.9)
NEUT#: 3.5 10*3/uL (ref 1.5–6.5)
NEUT%: 51.9 % (ref 38.4–76.8)
Platelets: 185 10*3/uL (ref 145–400)
RBC: 5 10*6/uL (ref 3.70–5.45)
RDW: 15.2 % — ABNORMAL HIGH (ref 11.2–14.5)
WBC: 6.7 10*3/uL (ref 3.9–10.3)
lymph#: 2.6 10*3/uL (ref 0.9–3.3)
nRBC: 0 % (ref 0–0)

## 2013-11-04 MED ORDER — TRASTUZUMAB CHEMO INJECTION 440 MG
6.0000 mg/kg | Freq: Once | INTRAVENOUS | Status: AC
  Administered 2013-11-04: 420 mg via INTRAVENOUS
  Filled 2013-11-04: qty 20

## 2013-11-04 MED ORDER — LIDOCAINE-PRILOCAINE 2.5-2.5 % EX CREA: 1.0000 "application " | TOPICAL_CREAM | CUTANEOUS | Status: DC | PRN

## 2013-11-04 MED ORDER — ACETAMINOPHEN 325 MG PO TABS
650.0000 mg | ORAL_TABLET | Freq: Once | ORAL | Status: AC
  Administered 2013-11-04: 650 mg via ORAL

## 2013-11-04 MED ORDER — EXEMESTANE 25 MG PO TABS: 25.0000 mg | ORAL_TABLET | Freq: Every day | ORAL | Status: DC

## 2013-11-04 MED ORDER — LORAZEPAM 1 MG PO TABS: 1.0000 mg | ORAL_TABLET | Freq: Three times a day (TID) | ORAL | Status: DC | PRN

## 2013-11-04 MED ORDER — ESZOPICLONE 2 MG PO TABS: 2.0000 mg | ORAL_TABLET | Freq: Every day | ORAL | Status: DC

## 2013-11-04 MED ORDER — HEPARIN SOD (PORK) LOCK FLUSH 100 UNIT/ML IV SOLN
500.0000 [IU] | Freq: Once | INTRAVENOUS | Status: AC | PRN
  Administered 2013-11-04: 500 [IU]
  Filled 2013-11-04: qty 5

## 2013-11-04 MED ORDER — ALBUTEROL SULFATE HFA 108 (90 BASE) MCG/ACT IN AERS: 2.0000 | INHALATION_SPRAY | Freq: Four times a day (QID) | RESPIRATORY_TRACT | Status: DC | PRN

## 2013-11-04 MED ORDER — SODIUM CHLORIDE 0.9 % IV SOLN
Freq: Once | INTRAVENOUS | Status: AC
  Administered 2013-11-04: 13:00:00 via INTRAVENOUS

## 2013-11-04 MED ORDER — ACETAMINOPHEN 325 MG PO TABS
ORAL_TABLET | ORAL | Status: AC
  Filled 2013-11-04: qty 2

## 2013-11-04 MED ORDER — LEVOTHYROXINE SODIUM 100 MCG PO TABS: 100.0000 ug | ORAL_TABLET | Freq: Every day | ORAL | Status: DC

## 2013-11-04 MED ORDER — SODIUM CHLORIDE 0.9 % IJ SOLN
10.0000 mL | INTRAMUSCULAR | Status: DC | PRN
  Administered 2013-11-04: 10 mL
  Filled 2013-11-04: qty 10

## 2013-11-04 NOTE — Patient Instructions (Signed)
[facility] Discharge Instructions for Patients Receiving Chemotherapy  Today you received the following chemotherapy agents Herceptin.  To help prevent nausea and vomiting after your treatment, we encourage you to take your nausea medication as prescribed.   If you develop nausea and vomiting that is not controlled by your nausea medication, call the clinic.   BELOW ARE SYMPTOMS THAT SHOULD BE REPORTED IMMEDIATELY:  *FEVER GREATER THAN 100.5 F  *CHILLS WITH OR WITHOUT FEVER  NAUSEA AND VOMITING THAT IS NOT CONTROLLED WITH YOUR NAUSEA MEDICATION  *UNUSUAL SHORTNESS OF BREATH  *UNUSUAL BRUISING OR BLEEDING  TENDERNESS IN MOUTH AND THROAT WITH OR WITHOUT PRESENCE OF ULCERS  *URINARY PROBLEMS  *BOWEL PROBLEMS  UNUSUAL RASH Items with * indicate a potential emergency and should be followed up as soon as possible.  Feel free to call the clinic you have any questions or concerns. The clinic phone number is [phone].    

## 2013-11-08 NOTE — Progress Notes (Signed)
[facility]  [phone] Fax:([phone]) [phone]  OFFICE PROGRESS NOTE   CC: Jane Hy, MD [address] [place] [address] Dr. Emelia Loron Dr. Chipper Herb Dr. Starleen Arms Dr. Dorothy Puffer   DIAGNOSIS: 57 year old female who presented on 12/24/2012 which new diagnosis of invasive ductal carcinoma, ER positive, PR positive, HER-2/neu positive in the left breast.     PRIOR THERAPY: #1  The patient has multiple medical problems but recently she had a mammogram performed that showed a 5.4 cm area of calcifications in the left breast.  MRI of the breasts showed the area in her left breast in the upper outer quadrant extended to about 7.2 cm. Needle core biopsy of the left breast showed a high-grade DCIS that was ER positive PR positive.  #2  She was seen by Dr. Emelia Loron regarding surgical options. The patient wanted to have bilateral mastectomies.  She is now status post bilateral mastectomy on 12/31/2012. The final pathology did reveal a 2.3 cm invasive ductal carcinoma in the left breast. The tumor was ER +100%, PR +86%,  HER-2/neu was amplified with a ratio of 6.81, Ki-67 was elevated at 46%. She is in the process of completing reconstruction.  #3 The patient was seen again in medical oncology for discussion of adjuvant treatment. Since her tumor was HER-2/neu positive, we recommended anti-HER-2/neu- based therapy consisting of Taxotere/Carboplatinum/Herceptin. The Taxotere and Carboplatinum was given every 3 weeks for 5 of 6 cycles. She received Neulasta on day 2. Herceptin was given weekly during the duration of chemotherapy and then every 3 weeks to finish out a one-year of treatment. She received systemic therapy with TCH from 02/04/2013 through 05/13/2013.  #4  The patient will continue Herceptin every 3 weeks to finish out 1 year.  #5  She began antiestrogen therapy with Letrozole 2.5 mg daily starting 06/03/2013, however patient began  having aches and pains. We discontinued.  #6 we will begin aromasin 25 mg daily  CURRENT THERAPY:  Q3 weekly Herceptin therapy/aromasin 25 mg    INTERVAL HISTORY:   Jane Gutierrez is doing well today.  She is here for evaluation prior to adjuvant q3week Herceptin therapy.  Patient has discontinued letrozole. Begin aromasin 25 mg. She had her reconstruction performed. She is not having any problems  She denies fevers, chills, shortness of breath, DOE, palpitations, orthopnea, or lower extremity swelling.   Remainder of the 10 point review of systems is negative.   MEDICAL HISTORY: Past Medical History  Diagnosis Date  . IBS (irritable bowel syndrome)   . ADD (attention deficit disorder)   . Lyme disease   . Contact lens/glasses fitting     wears contacts or glasses  . Allergy   . Hypothyroidism   . Tricuspid regurgitation     "from the chemo" (09/29/2013)  . Exertional shortness of breath     "probably from the Cerpetiian I'm taking" (09/29/2013)  . Arthritis     "hands" (09/29/2013)  . Breast cancer 12/04/12    left, ER/PR +  . Cervical cancer     ALLERGIES:  is allergic to betadine; iodine; shellfish-derived products; sulfamethoxazole-trimethoprim; cleocin; and penicillins.  MEDICATIONS:  Current Outpatient Prescriptions  Medication Sig Dispense Refill  . albuterol (PROVENTIL HFA;VENTOLIN HFA) 108 (90 BASE) MCG/ACT inhaler Inhale 2 puffs into the lungs every 6 (six) hours as needed for shortness of breath.  2 Inhaler  6  . aspirin 81 MG tablet Take 81 mg by mouth daily.      [name]  B Complex-C (B-COMPLEX WITH VITAMIN C) tablet Take 1 tablet by mouth daily.      [name] dextroamphetamine (DEXTROSTAT) 5 MG tablet Take 2.5 mg by mouth 3 (three) times daily.       . diazepam (VALIUM) 2 MG tablet Take 1 tablet (2 mg total) by mouth every 12 (twelve) hours as needed.  30 tablet  0  . eszopiclone (LUNESTA) 2 MG TABS tablet Take 1 tablet (2 mg total) by mouth at bedtime. Take immediately before bedtime   30 tablet  5  . levothyroxine (SYNTHROID, LEVOTHROID) 100 MCG tablet Take 1 tablet (100 mcg total) by mouth daily.  30 tablet  6  . lidocaine-prilocaine (EMLA) cream Apply 1 application topically as needed (for port-a-cath access).  30 g  2  . LORazepam (ATIVAN) 1 MG tablet Take 1 tablet (1 mg total) by mouth every 8 (eight) hours as needed for anxiety.  60 tablet  0  . Multiple Vitamin (MULTIVITAMIN WITH MINERALS) TABS Take 1 tablet by mouth daily.      . vitamin C (ASCORBIC ACID) 500 MG tablet Take 500 mg by mouth daily.      . vitamin E (VITAMIN E) 400 UNIT capsule Take 800 Units by mouth daily.      [name] EPINEPHrine (EPIPEN) 0.3 mg/0.3 mL DEVI Inject 0.3 mg into the muscle as needed (for allergic reaction).       [name] exemestane (AROMASIN) 25 MG tablet Take 1 tablet (25 mg total) by mouth daily after breakfast.  30 tablet  12  . loratadine (CLARITIN) 10 MG tablet Take 10 mg by mouth daily as needed (for 5 days after Neulasta injection).       No current facility-administered medications for this visit.    SURGICAL HISTORY:  Past Surgical History  Procedure Laterality Date  . Brain surgery      removal benign frontal lobe cyst in 1987  . Foot surgery Bilateral 2013    "toes were webbed together; had them separated" (09/29/2013)  . Gynecologic cryosurgery      cervical cancer, 1998 last remained disease free with recent normal pap  . Diagnostic laparoscopy  2004    lysis adh-uterine ablation  . Breast enhancement surgery Bilateral ~ 2008  . Breast surgery Left     removal benign left breast lesion  . Facial cosmetic surgery  ~ 2010  . Axillary sentinel node biopsy  12/31/2012    Procedure: AXILLARY SENTINEL NODE BIOPSY;  Surgeon: Emelia Loron, MD;  Location: [facility];  Service: General;  Laterality: Bilateral;  bilateral sentinel node  . Total mastectomy  12/31/2012    Procedure: TOTAL MASTECTOMY;  Surgeon: Emelia Loron, MD;  Location: [facility];   Service: General;  Laterality: Bilateral;  bilateral total mastectomies  . Breast reconstruction with placement of tissue expander and flex hd (acellular hydrated dermis)  12/31/2012    Procedure: BREAST RECONSTRUCTION WITH PLACEMENT OF TISSUE EXPANDER AND FLEX HD (ACELLULAR HYDRATED DERMIS);  Surgeon: Wayland Denis, DO;  Location: [facility];  Service: Plastics;  Laterality: Bilateral;  bilateral immediate breast reconstruction with expanders and flex hd   . Portacath placement  01/27/2013    Procedure: INSERTION PORT-A-CATH;  Surgeon: Emelia Loron, MD;  Location: [facility];  Service: General;  Laterality: Right;  . Incision and drainage of wound Left 04/28/2013    Procedure: IRRIGATION AND DEBRIDEMENT LEFT BREAST WOUND, POSSIBLE CLOSURE OF WOUND WITH DRAIN PLACEMENT;  Surgeon: Wayland Denis, DO;  Location: [facility];  Service: Plastics;  Laterality: Left;  . Tee without cardioversion N/A 06/04/2013    Procedure: TRANSESOPHAGEAL ECHOCARDIOGRAM (TEE);  Surgeon: Dolores Patty, MD;  Location: [facility];  Service: Cardiovascular;  Laterality: N/A;  . Tissue expander placement Left 06/16/2013    Procedure: LEFT BREAST TISSUE EXPANDER REMOVAL;  Surgeon: Wayland Denis, DO;  Location: [facility];  Service: Plastics;  Laterality: Left;  . Reconstruction breast w/ latissimus dorsi flap Left 09/29/2013  . Tissue expander placement Left 09/29/2013  . Abdominal hysterectomy  2005    TAH/BSO-lysis adh, enometriosis  . Dilation and curettage of uterus  2005  . Cervical cerclage    . Latissimus flap to breast Left 09/29/2013    Procedure: LATISSIMUS FLAP TO BREAST WITH TISSUE EXPANDER PLACEMENT FOR LEFT BREAST RECONSTRUCTION;  Surgeon: Wayland Denis, DO;  Location: [facility];  Service: Plastics;  Laterality: Left;  . Tissue expander placement Left 09/29/2013    Procedure: TISSUE EXPANDER;  Surgeon: Wayland Denis, DO;  Location: [facility];  Service:  Plastics;  Laterality: Left;    REVIEW OF SYSTEMS:  A 10 point review of systems was completed and is negative except for ongoing arthralgias/myalgias particular in her hips bilaterally.  The review of systems is otherwise noncontributory.   PHYSICAL EXAMINATION: Blood pressure 116/70, pulse 76, temperature 97 F (36.1 C), temperature source Oral, resp. rate 20, height 5' 4.96" (1.65 m), weight 161 lb 4.8 oz (73.165 kg). Body mass index is 26.87 kg/(m^2). General: Patient is a well appearing female in no acute distress HEENT: PERRLA, sclerae anicteric no conjunctival pallor, MMM Neck: supple, no palpable adenopathy Lungs: clear to auscultation bilaterally, no wheezes, rhonchi, or rales Cardiovascular: regular rate rhythm, S1, S2, no murmurs, rubs or gallops Abdomen: Soft, non-tender, non-distended, normoactive bowel sounds, no HSM Extremities: warm and well perfused, no clubbing, cyanosis, or edema Skin: No rashes or lesions, healing puncture wound on right shin, no surrounding erythema, warmth, or drainage.   Neuro: Non-focal Breasts, left mastectomy site non-erythematous, well healed, no nodularity. ECOG PERFORMANCE STATUS: 1 - Symptomatic but completely ambulatory   LABORATORY DATA: Lab Results  Component Value Date   WBC 6.7 11/04/2013   HGB 13.7 11/04/2013   HCT 40.6 11/04/2013   MCV 81.2 11/04/2013   PLT 185 11/04/2013      Chemistry      Component Value Date/Time   NA 140 11/04/2013 1058   NA 137 09/21/2013 0842   K 3.8 11/04/2013 1058   K 4.4 09/21/2013 0842   CL 101 09/21/2013 0842   CL 104 06/10/2013 1312   CO2 25 11/04/2013 1058   CO2 25 09/21/2013 0842   BUN 28.8* 11/04/2013 1058   BUN 26* 09/21/2013 0842   CREATININE 0.7 11/04/2013 1058   CREATININE 0.51 09/29/2013 2008      Component Value Date/Time   CALCIUM 9.4 11/04/2013 1058   CALCIUM 9.4 09/21/2013 0842   ALKPHOS 54 11/04/2013 1058   ALKPHOS 52 09/21/2013 0842   AST 31 11/04/2013 1058   AST 29  09/21/2013 0842   ALT 31 11/04/2013 1058   ALT 24 09/21/2013 0842   BILITOT 0.37 11/04/2013 1058   BILITOT 0.3 09/21/2013 0842      STUDIES: No results found.   ASSESSMENT: 57 year old [place], [place] woman:  (1) Status post bilateral mastectomies on 12/31/2012 showing:  (a) On the right, no evidence of malignancy  (b) On the left, a pT2 pN0, stage IIA invasive ductal carcinoma,  grade 2, estrogen receptor 100% positive, progesterone receptor 86% positive, with an MIB-1 of 46%, and HER-2 amplification with a CISH ratio of 6.81.  (2) Received adjuvant chemotherapy with Carboplatin/Docetaxel/Trastuzumab from 02/04/2013 through 05/13/2013 and was scheduled to consist of 6 cycles.  Unfortunately, the patient was unable to tolerate the entire course of chemotherapy and subsequently she requested that we discontinued the last cycle of her treatment which would have been cycle #6.  She received a total of 5 of 6 chemotherapy cycles.  She continues to receive every 3 week Trastuzumab (Herceptin).   (3) Currently taking adjuvant antiestrogen therapy with letters saw 2.5 mg by mouth daily since her tumor was ER positive.   (4) Ongoing arthralgias/myalgias   PLAN:   #1 Proceed with every 3 week Herceptin therapy today.  #2  The patient will return in 3 weeks' time  #3 begin aromasin 25 mg daily   All questions were answered.  Jane Gutierrez knows to call the clinic with any problems, questions or concerns. We can certainly see her  much sooner if necessary.The length of time of the face-to-face encounter was 25    minutes. More than 50% of time was spent counseling and coordination of care.   Drue Second, MD Medical/Oncology [facility] [place] [phone] (beeper) [phone] (Office)  11/08/2013, 12:14 PM

## 2013-11-09 ENCOUNTER — Telehealth: Payer: Self-pay | Admitting: Oncology

## 2013-11-22 ENCOUNTER — Other Ambulatory Visit (HOSPITAL_COMMUNITY): Payer: Self-pay | Admitting: Endocrinology

## 2013-11-22 DIAGNOSIS — R222 Localized swelling, mass and lump, trunk: Secondary | ICD-10-CM

## 2013-11-25 ENCOUNTER — Other Ambulatory Visit (HOSPITAL_BASED_OUTPATIENT_CLINIC_OR_DEPARTMENT_OTHER): Payer: 59 | Admitting: Lab

## 2013-11-25 ENCOUNTER — Telehealth: Payer: Self-pay | Admitting: Oncology

## 2013-11-25 ENCOUNTER — Ambulatory Visit (HOSPITAL_BASED_OUTPATIENT_CLINIC_OR_DEPARTMENT_OTHER): Payer: 59 | Admitting: Adult Health

## 2013-11-25 ENCOUNTER — Encounter: Payer: Self-pay | Admitting: Adult Health

## 2013-11-25 ENCOUNTER — Ambulatory Visit (HOSPITAL_BASED_OUTPATIENT_CLINIC_OR_DEPARTMENT_OTHER): Payer: 59

## 2013-11-25 VITALS — BP 127/80 | HR 76 | Temp 97.6°F | Resp 18 | Ht 64.0 in | Wt 163.6 lb

## 2013-11-25 DIAGNOSIS — Z5112 Encounter for antineoplastic immunotherapy: Secondary | ICD-10-CM

## 2013-11-25 DIAGNOSIS — C50419 Malignant neoplasm of upper-outer quadrant of unspecified female breast: Secondary | ICD-10-CM

## 2013-11-25 DIAGNOSIS — M2550 Pain in unspecified joint: Secondary | ICD-10-CM

## 2013-11-25 DIAGNOSIS — D0510 Intraductal carcinoma in situ of unspecified breast: Secondary | ICD-10-CM

## 2013-11-25 DIAGNOSIS — Z170 Estrogen receptor positive status [ER+]: Secondary | ICD-10-CM

## 2013-11-25 DIAGNOSIS — M899 Disorder of bone, unspecified: Secondary | ICD-10-CM

## 2013-11-25 DIAGNOSIS — IMO0001 Reserved for inherently not codable concepts without codable children: Secondary | ICD-10-CM

## 2013-11-25 LAB — COMPREHENSIVE METABOLIC PANEL (CC13)
ALT: 31 U/L (ref 0–55)
AST: 27 U/L (ref 5–34)
Albumin: 3.4 g/dL — ABNORMAL LOW (ref 3.5–5.0)
Alkaline Phosphatase: 51 U/L (ref 40–150)
Anion Gap: 10 mEq/L (ref 3–11)
BUN: 28.7 mg/dL — ABNORMAL HIGH (ref 7.0–26.0)
CO2: 27 mEq/L (ref 22–29)
Calcium: 9.5 mg/dL (ref 8.4–10.4)
Chloride: 104 mEq/L (ref 98–109)
Creatinine: 0.7 mg/dL (ref 0.6–1.1)
Potassium: 3.9 mEq/L (ref 3.5–5.1)
Total Protein: 6.8 g/dL (ref 6.4–8.3)

## 2013-11-25 LAB — CBC WITH DIFFERENTIAL/PLATELET
Basophils Absolute: 0 10*3/uL (ref 0.0–0.1)
Eosinophils Absolute: 0.1 10*3/uL (ref 0.0–0.5)
HGB: 13.6 g/dL (ref 11.6–15.9)
LYMPH%: 45.3 % (ref 14.0–49.7)
MCH: 27.3 pg (ref 25.1–34.0)
MCHC: 33.3 g/dL (ref 31.5–36.0)
MCV: 82.1 fL (ref 79.5–101.0)
MONO%: 8.8 % (ref 0.0–14.0)
NEUT#: 2.5 10*3/uL (ref 1.5–6.5)
NEUT%: 43.6 % (ref 38.4–76.8)
Platelets: 193 10*3/uL (ref 145–400)
RDW: 14.6 % — ABNORMAL HIGH (ref 11.2–14.5)

## 2013-11-25 MED ORDER — TRASTUZUMAB CHEMO INJECTION 440 MG
6.0000 mg/kg | Freq: Once | INTRAVENOUS | Status: AC
  Administered 2013-11-25: 420 mg via INTRAVENOUS
  Filled 2013-11-25: qty 20

## 2013-11-25 MED ORDER — SODIUM CHLORIDE 0.9 % IJ SOLN
10.0000 mL | INTRAMUSCULAR | Status: DC | PRN
  Administered 2013-11-25: 10 mL
  Filled 2013-11-25: qty 10

## 2013-11-25 MED ORDER — ACETAMINOPHEN 325 MG PO TABS
ORAL_TABLET | ORAL | Status: AC
  Filled 2013-11-25: qty 2

## 2013-11-25 MED ORDER — HEPARIN SOD (PORK) LOCK FLUSH 100 UNIT/ML IV SOLN
500.0000 [IU] | Freq: Once | INTRAVENOUS | Status: AC | PRN
  Administered 2013-11-25: 500 [IU]
  Filled 2013-11-25: qty 5

## 2013-11-25 MED ORDER — SODIUM CHLORIDE 0.9 % IV SOLN
Freq: Once | INTRAVENOUS | Status: AC
  Administered 2013-11-25: 13:00:00 via INTRAVENOUS

## 2013-11-25 MED ORDER — ACETAMINOPHEN 325 MG PO TABS
650.0000 mg | ORAL_TABLET | Freq: Once | ORAL | Status: AC
  Administered 2013-11-25: 650 mg via ORAL

## 2013-11-25 NOTE — Telephone Encounter (Signed)
, °

## 2013-11-25 NOTE — Patient Instructions (Signed)
[facility] Discharge Instructions for Patients Receiving Chemotherapy  Today you received the following chemotherapy agents Herceptin  To help prevent nausea and vomiting after your treatment, we encourage you to take your nausea medication     If you develop nausea and vomiting that is not controlled by your nausea medication, call the clinic.   BELOW ARE SYMPTOMS THAT SHOULD BE REPORTED IMMEDIATELY:  *FEVER GREATER THAN 100.5 F  *CHILLS WITH OR WITHOUT FEVER  NAUSEA AND VOMITING THAT IS NOT CONTROLLED WITH YOUR NAUSEA MEDICATION  *UNUSUAL SHORTNESS OF BREATH  *UNUSUAL BRUISING OR BLEEDING  TENDERNESS IN MOUTH AND THROAT WITH OR WITHOUT PRESENCE OF ULCERS  *URINARY PROBLEMS  *BOWEL PROBLEMS  UNUSUAL RASH Items with * indicate a potential emergency and should be followed up as soon as possible.  Feel free to call the clinic you have any questions or concerns. The clinic phone number is [phone].    

## 2013-11-25 NOTE — Progress Notes (Signed)
[facility]  [phone] [phone]  OFFICE PROGRESS NOTE   CC: [name], MD [address] [place] [address] Dr. Emelia Loron Dr. Chipper Herb Dr. Starleen Arms Dr. Dorothy Puffer   DIAGNOSIS: 57 year old female who presented on 12/24/2012 which new diagnosis of invasive ductal carcinoma, ER positive, PR positive, HER-2/neu positive in the left breast.     PRIOR THERAPY: #1  The patient has multiple medical problems but recently she had a mammogram performed that showed a 5.4 cm area of calcifications in the left breast.  MRI of the breasts showed the area in her left breast in the upper outer quadrant extended to about 7.2 cm. Needle core biopsy of the left breast showed a high-grade DCIS that was ER positive PR positive.  #2  She was seen by Dr. Emelia Loron regarding surgical options. The patient wanted to have bilateral mastectomies.  She is now status post bilateral mastectomy on 12/31/2012. The final pathology did reveal a 2.3 cm invasive ductal carcinoma in the left breast. The tumor was ER +100%, PR +86%,  HER-2/neu was amplified with a ratio of 6.81, Ki-67 was elevated at 46%. She is in the process of completing reconstruction.  #3 The patient was seen again in medical oncology for discussion of adjuvant treatment. Since her tumor was HER-2/neu positive, we recommended anti-HER-2/neu- based therapy consisting of Taxotere/Carboplatinum/Herceptin. The Taxotere and Carboplatinum was given every 3 weeks for 5 of 6 cycles. She received Neulasta on day 2. Herceptin was given weekly during the duration of chemotherapy and then every 3 weeks to finish out a one-year of treatment. She received systemic therapy with TCH from 02/04/2013 through 05/13/2013.  #4  The patient will continue Herceptin every 3 weeks to finish out 1 year.  #5  She began antiestrogen therapy with Letrozole 2.5 mg daily starting 06/03/2013, however patient began  having aches and pains. We discontinued.  #6 we will begin aromasin 25 mg daily  CURRENT THERAPY:  Q3 weekly Herceptin therapy/aromasin 25 mg    INTERVAL HISTORY:   Ms. Rennie Plowman is doing well today.  She has a knot on her breast bone, she saw her PCP and he ordered a CT chest and that is scheduled for Friday, 11/26/13.  She is also concerned about having multiple myeloma due to her mother having it.  She would like these labs drawn at her next appointment.  She has yet to start Aromasin, and is planning on starting it after this weekend.  Otherwise, a 10 point ROS is neg.    MEDICAL HISTORY: Past Medical History  Diagnosis Date  . IBS (irritable bowel syndrome)   . ADD (attention deficit disorder)   . Lyme disease   . Contact lens/glasses fitting     wears contacts or glasses  . Allergy   . Hypothyroidism   . Tricuspid regurgitation     "from the chemo" (09/29/2013)  . Exertional shortness of breath     "probably from the Cerpetiian I'm taking" (09/29/2013)  . Arthritis     "hands" (09/29/2013)  . Breast cancer 12/04/12    left, ER/PR +  . Cervical cancer     ALLERGIES:  is allergic to betadine; iodine; shellfish-derived products; sulfamethoxazole-trimethoprim; cleocin; and penicillins.  MEDICATIONS:  Current Outpatient Prescriptions  Medication Sig Dispense Refill  . albuterol (PROVENTIL HFA;VENTOLIN HFA) 108 (90 BASE) MCG/ACT inhaler Inhale 2 puffs into the lungs every 6 (six) hours as needed for shortness of breath.  2 Inhaler  6  . aspirin 81 MG tablet Take 81 mg by mouth daily.      . B Complex-C (B-COMPLEX WITH VITAMIN C) tablet Take 1 tablet by mouth daily.      [name] dextroamphetamine (DEXTROSTAT) 5 MG tablet Take 2.5 mg by mouth 3 (three) times daily.       . diazepam (VALIUM) 2 MG tablet Take 1 tablet (2 mg total) by mouth every 12 (twelve) hours as needed.  30 tablet  0  . EPINEPHrine (EPIPEN) 0.3 mg/0.3 mL DEVI Inject 0.3 mg into the muscle as needed (for allergic reaction).        [name] exemestane (AROMASIN) 25 MG tablet Take 1 tablet (25 mg total) by mouth daily after breakfast.  30 tablet  12  . levothyroxine (SYNTHROID, LEVOTHROID) 100 MCG tablet Take 1 tablet (100 mcg total) by mouth daily.  30 tablet  6  . lidocaine-prilocaine (EMLA) cream Apply 1 application topically as needed (for port-a-cath access).  30 g  2  . loratadine (CLARITIN) 10 MG tablet Take 10 mg by mouth daily as needed (for 5 days after Neulasta injection).      . LORazepam (ATIVAN) 1 MG tablet Take 1 tablet (1 mg total) by mouth every 8 (eight) hours as needed for anxiety.  60 tablet  0  . Multiple Vitamin (MULTIVITAMIN WITH MINERALS) TABS Take 1 tablet by mouth daily.      . vitamin C (ASCORBIC ACID) 500 MG tablet Take 500 mg by mouth daily.      . vitamin E (VITAMIN E) 400 UNIT capsule Take 800 Units by mouth daily.      . eszopiclone (LUNESTA) 2 MG TABS tablet Take 1 tablet (2 mg total) by mouth at bedtime. Take immediately before bedtime  30 tablet  5   No current facility-administered medications for this visit.    SURGICAL HISTORY:  Past Surgical History  Procedure Laterality Date  . Brain surgery      removal benign frontal lobe cyst in 1987  . Foot surgery Bilateral 2013    "toes were webbed together; had them separated" (09/29/2013)  . Gynecologic cryosurgery      cervical cancer, 1998 last remained disease free with recent normal pap  . Diagnostic laparoscopy  2004    lysis adh-uterine ablation  . Breast enhancement surgery Bilateral ~ 2008  . Breast surgery Left     removal benign left breast lesion  . Facial cosmetic surgery  ~ 2010  . Axillary sentinel node biopsy  12/31/2012    Procedure: AXILLARY SENTINEL NODE BIOPSY;  Surgeon: Emelia Loron, MD;  Location: [facility];  Service: General;  Laterality: Bilateral;  bilateral sentinel node  . Total mastectomy  12/31/2012    Procedure: TOTAL MASTECTOMY;  Surgeon: Emelia Loron, MD;  Location: [facility];  Service: General;  Laterality: Bilateral;  bilateral total mastectomies  . Breast reconstruction with placement of tissue expander and flex hd (acellular hydrated dermis)  12/31/2012    Procedure: BREAST RECONSTRUCTION WITH PLACEMENT OF TISSUE EXPANDER AND FLEX HD (ACELLULAR HYDRATED DERMIS);  Surgeon: Wayland Denis, DO;  Location: [facility];  Service: Plastics;  Laterality: Bilateral;  bilateral immediate breast reconstruction with expanders and flex hd   . Portacath placement  01/27/2013    Procedure: INSERTION PORT-A-CATH;  Surgeon: Emelia Loron, MD;  Location: [facility];  Service: General;  Laterality: Right;  . Incision and drainage of wound Left 04/28/2013    Procedure:  IRRIGATION AND DEBRIDEMENT LEFT BREAST WOUND, POSSIBLE CLOSURE OF WOUND WITH DRAIN PLACEMENT;  Surgeon: Wayland Denis, DO;  Location: [facility];  Service: Plastics;  Laterality: Left;  . Tee without cardioversion N/A 06/04/2013    Procedure: TRANSESOPHAGEAL ECHOCARDIOGRAM (TEE);  Surgeon: Dolores Patty, MD;  [facility];  Service: Cardiovascular;  Laterality: N/A;  . Tissue expander placement Left 06/16/2013    Procedure: LEFT BREAST TISSUE EXPANDER REMOVAL;  Surgeon: Wayland Denis, DO;  Location: [facility];  Service: Plastics;  Laterality: Left;  . Reconstruction breast w/ latissimus dorsi flap Left 09/29/2013  . Tissue expander placement Left 09/29/2013  . Abdominal hysterectomy  2005    TAH/BSO-lysis adh, enometriosis  . Dilation and curettage of uterus  2005  . Cervical cerclage    . Latissimus flap to breast Left 09/29/2013    Procedure: LATISSIMUS FLAP TO BREAST WITH TISSUE EXPANDER PLACEMENT FOR LEFT BREAST RECONSTRUCTION;  Surgeon: Wayland Denis, DO;  Location: [facility];  Service: Plastics;  Laterality: Left;  . Tissue expander placement Left 09/29/2013    Procedure: TISSUE EXPANDER;  Surgeon: Wayland Denis, DO;  Location: [facility];   Service: Plastics;  Laterality: Left;    REVIEW OF SYSTEMS:  A 10 point review of systems was conducted and is otherwise negative except for what is noted above.     PHYSICAL EXAMINATION: Blood pressure 127/80, pulse 76, temperature 97.6 F (36.4 C), temperature source Oral, resp. rate 18, height 5\' 4"  (1.626 m), weight 163 lb 9.6 oz (74.208 kg). Body mass index is 28.07 kg/(m^2). General: Patient is a well appearing female in no acute distress HEENT: PERRLA, sclerae anicteric no conjunctival pallor, MMM Neck: supple, no palpable adenopathy Lungs: clear to auscultation bilaterally, no wheezes, rhonchi, or rales Cardiovascular: regular rate rhythm, S1, S2, no murmurs, rubs or gallops Abdomen: Soft, non-tender, non-distended, normoactive bowel sounds, no HSM Extremities: warm and well perfused, no clubbing, cyanosis, or edema Skin: No rashes or lesions, healing puncture wound on right shin, no surrounding erythema, warmth, or drainage.   Neuro: Non-focal Breasts, left mastectomy site non-erythematous, well healed, no nodularity. ECOG PERFORMANCE STATUS: 1 - Symptomatic but completely ambulatory   LABORATORY DATA: Lab Results  Component Value Date   WBC 5.7 11/25/2013   HGB 13.6 11/25/2013   HCT 40.9 11/25/2013   MCV 82.1 11/25/2013   PLT 193 11/25/2013      Chemistry      Component Value Date/Time   NA 141 11/25/2013 1118   NA 137 09/21/2013 0842   K 3.9 11/25/2013 1118   K 4.4 09/21/2013 0842   CL 101 09/21/2013 0842   CL 104 06/10/2013 1312   CO2 27 11/25/2013 1118   CO2 25 09/21/2013 0842   BUN 28.7* 11/25/2013 1118   BUN 26* 09/21/2013 0842   CREATININE 0.7 11/25/2013 1118   CREATININE 0.51 09/29/2013 2008      Component Value Date/Time   CALCIUM 9.5 11/25/2013 1118   CALCIUM 9.4 09/21/2013 0842   ALKPHOS 51 11/25/2013 1118   ALKPHOS 52 09/21/2013 0842   AST 27 11/25/2013 1118   AST 29 09/21/2013 0842   ALT 31 11/25/2013 1118   ALT 24 09/21/2013 0842   BILITOT 0.33 11/25/2013 1118    BILITOT 0.3 09/21/2013 0842      STUDIES: No results found.   ASSESSMENT: 57 year old [place] woman:  (1) Status post bilateral mastectomies on 12/31/2012 showing:  (a) On the right, no evidence of malignancy  (b)  On the left, a pT2 pN0, stage IIA invasive ductal carcinoma, grade 2, estrogen receptor 100% positive, progesterone receptor 86% positive, with an MIB-1 of 46%, and HER-2 amplification with a CISH ratio of 6.81.  (2) Received adjuvant chemotherapy with Carboplatin/Docetaxel/Trastuzumab from 02/04/2013 through 05/13/2013 and was scheduled to consist of 6 cycles.  Unfortunately, the patient was unable to tolerate the entire course of chemotherapy and subsequently she requested that we discontinued the last cycle of her treatment which would have been cycle #6.  She received a total of 5 of 6 chemotherapy cycles.  She continues to receive every 3 week Trastuzumab (Herceptin).   (3) Currently taking adjuvant antiestrogen therapy with letters saw 2.5 mg by mouth daily since her tumor was ER positive.   (4) Ongoing arthralgias/myalgias   PLAN:   #1 Patient is doing well.  She will proceed with Herceptin therapy today and restart Aromasin in one week.    #2 I will get myeloma labs at her next appointment.   #3 She will return in 3 weeks for her next appointment.  She has echo and f/u Dr. Gala Romney on 12/06/13.   All questions were answered.  Ms. Rennie Plowman knows to call the clinic with any problems, questions or concerns. We can certainly see her  much sooner if necessary.  I spent 25 minutes counseling the patient face to face.  The total time spent in the appointment was 30 minutes.   Illa Level, NP Medical Oncology [facility] [phone] 11/27/2013, 7:55 AM

## 2013-11-26 ENCOUNTER — Encounter (HOSPITAL_COMMUNITY): Payer: Self-pay

## 2013-11-26 ENCOUNTER — Ambulatory Visit (HOSPITAL_COMMUNITY)
Admission: RE | Admit: 2013-11-26 | Discharge: 2013-11-26 | Disposition: A | Discharge status: Home / Self Care | Payer: 59 | Source: Ambulatory Visit | Attending: Endocrinology | Admitting: Endocrinology

## 2013-11-26 DIAGNOSIS — Z978 Presence of other specified devices: Secondary | ICD-10-CM | POA: Insufficient documentation

## 2013-11-26 DIAGNOSIS — R222 Localized swelling, mass and lump, trunk: Secondary | ICD-10-CM

## 2013-11-26 DIAGNOSIS — C50919 Malignant neoplasm of unspecified site of unspecified female breast: Secondary | ICD-10-CM | POA: Insufficient documentation

## 2013-11-26 DIAGNOSIS — E278 Other specified disorders of adrenal gland: Secondary | ICD-10-CM | POA: Insufficient documentation

## 2013-11-26 MED ORDER — IOHEXOL 300 MG/ML  SOLN
80.0000 mL | Freq: Once | INTRAMUSCULAR | Status: AC | PRN
  Administered 2013-11-26: 80 mL via INTRAVENOUS

## 2013-12-03 ENCOUNTER — Encounter: Payer: Self-pay | Admitting: Oncology

## 2013-12-03 NOTE — Progress Notes (Signed)
Patient ID: Jane Gutierrez, female   DOB: 11-22-1956, 57 y.o.   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 ER +100% PR +86% HER-2/neu (Sentinel nodes were negative for metastatic disease).  She is s/p bilateral mastectomies with reconstruction.    Plan for adjuvant chemotherapy and Herceptin.  On 02/04/13 she started Taxotere/carboplatinum to be given every 3 weeks for a total of 6 cycles. She will also receive Herceptin weekly for the duration of chemotherapy and then every 3 weeks to finish out one year. Per Dr Jane Gutierrez she will complete in April 2015. .   Admitted to [facility] 04/13/13 with sepsis due strep throat and chest wound infection. Wound was revised in May. Had drain in x 3 weeks.   VQ 05/27/13 Negative for PE  Echos: 01/26/13 EF 60% lateral S' 10.9 Grade II diastolic dysfunction 03/25/13 EF 60-65% lat s' 12.8  RV normal 05/27/13 EF lat S' 12.5 RV mildly dilated. Normal function. +RAE. IVC dilated 2.8 cm. Mod TR  RVSP 35-40.  06/04/13 TEE EF 55-60% mild-mod TR 09/09/13 EF 55-60% lateral s' 12.1 RV ok. Mild TR RVSP 25-30 12/06/13 EF 55-60% lateral s' 11.6, GS -21%, RV ok.   Follow up:  Since last visit on October 8th had latismus flap and expander placed on the L. Still remains on Herceptin q 3 weeks and will finish Spring of 2015 (question about end of Feb or early April). CT scan chest 12/14 showed mild nodular enlargement of L adrenal gland and will f/u with Dr. Evlyn Gutierrez.  Denies SOB/PND/Orthopnea. Started exercising again and is working 4-6 hours per day.   Review of Systems: All pertinent positives and negatives as in HPI, otherwise negative.   Past Medical History  Diagnosis Date  . IBS (irritable bowel syndrome)   . ADD (attention deficit disorder)   . Lyme disease   . Contact lens/glasses fitting     wears contacts or glasses  . Allergy    . Hypothyroidism   . Tricuspid regurgitation     "from the chemo" (57/07/2013)  . Exertional shortness of breath     "probably from the Cerpetiian I'm taking" (57/07/2013)  . Arthritis     "hands" (57/07/2013)  . Breast cancer 12/04/12    left, ER/PR +  . Cervical cancer     Current Outpatient Prescriptions  Medication Sig Dispense Refill  . albuterol (PROVENTIL HFA;VENTOLIN HFA) 108 (90 BASE) MCG/ACT inhaler Inhale 2 puffs into the lungs every 6 (six) hours as needed for shortness of breath.  2 Inhaler  6  . aspirin 81 MG tablet Take 81 mg by mouth daily.      . B Complex-C (B-COMPLEX WITH VITAMIN C) tablet Take 1 tablet by mouth daily.      [name] dextroamphetamine (DEXTROSTAT) 5 MG tablet Take 2.5 mg by mouth 3 (three) times daily.       . diazepam (VALIUM) 2 MG tablet Take 1 tablet (2 mg total) by mouth every 12 (twelve) hours as needed.  30 tablet  0  . EPINEPHrine (EPIPEN) 0.3 mg/0.3 mL DEVI Inject 0.3 mg into the muscle as needed (for allergic reaction).       . eszopiclone (LUNESTA) 2 MG TABS tablet Take 1 tablet (2 mg total) by mouth at bedtime. Take immediately before bedtime  30 tablet  5  . exemestane (AROMASIN)  25 MG tablet Take 1 tablet (25 mg total) by mouth daily after breakfast.  30 tablet  12  . levothyroxine (SYNTHROID, LEVOTHROID) 100 MCG tablet Take 1 tablet (100 mcg total) by mouth daily.  30 tablet  6  . lidocaine-prilocaine (EMLA) cream Apply 1 application topically as needed (for port-a-cath access).  30 g  2  . loratadine (CLARITIN) 10 MG tablet Take 10 mg by mouth daily as needed (for 5 days after Neulasta injection).      . LORazepam (ATIVAN) 1 MG tablet Take 1 tablet (1 mg total) by mouth every 8 (eight) hours as needed for anxiety.  60 tablet  0  . Multiple Vitamin (MULTIVITAMIN WITH MINERALS) TABS Take 1 tablet by mouth daily.      . vitamin C (ASCORBIC ACID) 500 MG tablet Take 500 mg by mouth daily.      . vitamin E (VITAMIN E) 400 UNIT capsule Take 800 Units by  mouth daily.       No current facility-administered medications for this encounter.     Allergies  Allergen Reactions  . Betadine [Povidone Iodine] Anaphylaxis  . Iodine Anaphylaxis    REACTION: anaphylactic  . Shellfish-Derived Products   . Sulfamethoxazole-Trimethoprim Other (See Comments)    Allergy from long ago unsure of reaction.   [name] Cleocin [Clindamycin Hcl] Rash  . Penicillins Rash    Filed Vitals:   12/06/13 1047  BP: 118/72  Pulse: 78  Weight: 164 lb (74.39 kg)  SpO2: 98%    PHYSICAL EXAM: General:  Well appearing. No respiratory difficulty HEENT: normal Neck: supple.  JVP flat Carotids 2+ bilat; no bruits. No lymphadenopathy or thryomegaly appreciated. Cor: PMI nondisplaced. Regular rate & rhythm. No rubs, gallops  Lungs: clear Abdomen: soft, nontender, nondistended. No hepatosplenomegaly. No bruits or masses. Good bowel sounds. Extremities: no cyanosis, clubbing, rash, trace edema on RLE.  Neuro: alert & oriented x 3, cranial nerves grossly intact. moves all 4 extremities w/o difficulty. Affect pleasant.   ASSESSMENT & PLAN:  1. L Breast Cancer- S/P bilateral mastectomies. - Now receiving herceptin every 3 weeks, will finish treatment early 2015 (Feb-April). - Dr Jane Gutierrez reviewed and discussed ECHO. EF and lateral s' stable. Will continue treatment and follow up with repeat ECHO 02/2014.   Ulla Potash B NP-C 11:02 AM   Patient seen and examined with Ulla Potash, NP. We discussed all aspects of the encounter. I agree with the assessment and plan as stated above. I reviewed echos personally. EF and Doppler parameters stable. No HF on exam. Continue Herceptin.   Jane Boom Marciel Offenberger,MD 7:42 PM

## 2013-12-06 ENCOUNTER — Ambulatory Visit (HOSPITAL_COMMUNITY)
Admission: RE | Admit: 2013-12-06 | Discharge: 2013-12-06 | Disposition: A | Discharge status: Home / Self Care | Payer: 59 | Source: Ambulatory Visit | Attending: Oncology | Admitting: Oncology

## 2013-12-06 ENCOUNTER — Ambulatory Visit (HOSPITAL_BASED_OUTPATIENT_CLINIC_OR_DEPARTMENT_OTHER)
Admission: RE | Admit: 2013-12-06 | Discharge: 2013-12-06 | Disposition: A | Discharge status: Home / Self Care | Payer: 59 | Source: Ambulatory Visit | Attending: Internal Medicine | Admitting: Internal Medicine

## 2013-12-06 VITALS — BP 118/72 | HR 78 | Wt 164.0 lb

## 2013-12-06 DIAGNOSIS — Z9010 Acquired absence of unspecified breast and nipple: Secondary | ICD-10-CM | POA: Insufficient documentation

## 2013-12-06 DIAGNOSIS — I079 Rheumatic tricuspid valve disease, unspecified: Secondary | ICD-10-CM | POA: Insufficient documentation

## 2013-12-06 DIAGNOSIS — C50919 Malignant neoplasm of unspecified site of unspecified female breast: Secondary | ICD-10-CM

## 2013-12-06 DIAGNOSIS — C50912 Malignant neoplasm of unspecified site of left female breast: Secondary | ICD-10-CM

## 2013-12-06 DIAGNOSIS — I369 Nonrheumatic tricuspid valve disorder, unspecified: Secondary | ICD-10-CM

## 2013-12-06 NOTE — Patient Instructions (Signed)
Have a wonderful Holiday Season.  Follow up March 2015 with ECHO.

## 2013-12-06 NOTE — Progress Notes (Signed)
  Echocardiogram 2D Echocardiogram has been performed.  Jane Gutierrez 12/06/2013, 10:54 AM

## 2013-12-14 ENCOUNTER — Other Ambulatory Visit (HOSPITAL_BASED_OUTPATIENT_CLINIC_OR_DEPARTMENT_OTHER): Payer: 59

## 2013-12-14 ENCOUNTER — Ambulatory Visit: Payer: 59 | Admitting: Family

## 2013-12-14 ENCOUNTER — Ambulatory Visit (HOSPITAL_BASED_OUTPATIENT_CLINIC_OR_DEPARTMENT_OTHER): Payer: 59

## 2013-12-14 VITALS — BP 121/81 | HR 79 | Temp 96.2°F | Resp 19

## 2013-12-14 DIAGNOSIS — M899 Disorder of bone, unspecified: Secondary | ICD-10-CM

## 2013-12-14 DIAGNOSIS — C50419 Malignant neoplasm of upper-outer quadrant of unspecified female breast: Secondary | ICD-10-CM

## 2013-12-14 DIAGNOSIS — Z5112 Encounter for antineoplastic immunotherapy: Secondary | ICD-10-CM

## 2013-12-14 LAB — CBC WITH DIFFERENTIAL/PLATELET
BASO%: 0.4 % (ref 0.0–2.0)
Basophils Absolute: 0 10*3/uL (ref 0.0–0.1)
EOS%: 2.1 % (ref 0.0–7.0)
HGB: 14.2 g/dL (ref 11.6–15.9)
LYMPH%: 48.5 % (ref 14.0–49.7)
MCH: 27.6 pg (ref 25.1–34.0)
MCHC: 33.6 g/dL (ref 31.5–36.0)
MCV: 82.1 fL (ref 79.5–101.0)
MONO%: 9.1 % (ref 0.0–14.0)
NEUT%: 39.9 % (ref 38.4–76.8)
Platelets: 210 10*3/uL (ref 145–400)
RDW: 14.1 % (ref 11.2–14.5)
lymph#: 2.3 10*3/uL (ref 0.9–3.3)

## 2013-12-14 LAB — COMPREHENSIVE METABOLIC PANEL (CC13)
ALT: 32 U/L (ref 0–55)
AST: 30 U/L (ref 5–34)
Alkaline Phosphatase: 51 U/L (ref 40–150)
Anion Gap: 7 mEq/L (ref 3–11)
CO2: 27 mEq/L (ref 22–29)
Creatinine: 0.7 mg/dL (ref 0.6–1.1)
Glucose: 102 mg/dl (ref 70–140)
Total Bilirubin: 0.36 mg/dL (ref 0.20–1.20)
Total Protein: 6.7 g/dL (ref 6.4–8.3)

## 2013-12-14 MED ORDER — ACETAMINOPHEN 325 MG PO TABS
650.0000 mg | ORAL_TABLET | Freq: Once | ORAL | Status: AC
  Administered 2013-12-14: 650 mg via ORAL

## 2013-12-14 MED ORDER — HEPARIN SOD (PORK) LOCK FLUSH 100 UNIT/ML IV SOLN
500.0000 [IU] | Freq: Once | INTRAVENOUS | Status: AC | PRN
  Administered 2013-12-14: 500 [IU]
  Filled 2013-12-14: qty 5

## 2013-12-14 MED ORDER — ACETAMINOPHEN 325 MG PO TABS
ORAL_TABLET | ORAL | Status: AC
  Filled 2013-12-14: qty 2

## 2013-12-14 MED ORDER — SODIUM CHLORIDE 0.9 % IV SOLN
Freq: Once | INTRAVENOUS | Status: AC
  Administered 2013-12-14: 10:00:00 via INTRAVENOUS

## 2013-12-14 MED ORDER — SODIUM CHLORIDE 0.9 % IJ SOLN
10.0000 mL | INTRAMUSCULAR | Status: DC | PRN
  Administered 2013-12-14: 10 mL
  Filled 2013-12-14: qty 10

## 2013-12-14 MED ORDER — TRASTUZUMAB CHEMO INJECTION 440 MG
6.0000 mg/kg | Freq: Once | INTRAVENOUS | Status: AC
  Administered 2013-12-14: 420 mg via INTRAVENOUS
  Filled 2013-12-14: qty 20

## 2013-12-14 NOTE — Patient Instructions (Signed)
[facility] Discharge Instructions for Patients Receiving Chemotherapy  Today you received the following chemotherapy agents herceptin  To help prevent nausea and vomiting after your treatment, we encourage you to take your nausea medication if needed.   If you develop nausea and vomiting that is not controlled by your nausea medication, call the clinic.   BELOW ARE SYMPTOMS THAT SHOULD BE REPORTED IMMEDIATELY:  *FEVER GREATER THAN 100.5 F  *CHILLS WITH OR WITHOUT FEVER  NAUSEA AND VOMITING THAT IS NOT CONTROLLED WITH YOUR NAUSEA MEDICATION  *UNUSUAL SHORTNESS OF BREATH  *UNUSUAL BRUISING OR BLEEDING  TENDERNESS IN MOUTH AND THROAT WITH OR WITHOUT PRESENCE OF ULCERS  *URINARY PROBLEMS  *BOWEL PROBLEMS  UNUSUAL RASH Items with * indicate a potential emergency and should be followed up as soon as possible.  Feel free to call the clinic you have any questions or concerns. The clinic phone number is [phone].    

## 2013-12-20 LAB — PROTEIN ELECTROPHORESIS, SERUM
Albumin ELP: 57.1 % (ref 55.8–66.1)
Beta 2: 5.8 % (ref 3.2–6.5)
Gamma Globulin: 14.4 % (ref 11.1–18.8)

## 2014-01-06 ENCOUNTER — Telehealth: Payer: Self-pay | Admitting: Oncology

## 2014-01-06 ENCOUNTER — Ambulatory Visit (HOSPITAL_BASED_OUTPATIENT_CLINIC_OR_DEPARTMENT_OTHER): Payer: 59 | Admitting: Adult Health

## 2014-01-06 ENCOUNTER — Other Ambulatory Visit (HOSPITAL_BASED_OUTPATIENT_CLINIC_OR_DEPARTMENT_OTHER): Payer: 59

## 2014-01-06 ENCOUNTER — Ambulatory Visit (HOSPITAL_BASED_OUTPATIENT_CLINIC_OR_DEPARTMENT_OTHER): Payer: 59

## 2014-01-06 ENCOUNTER — Encounter: Payer: Self-pay | Admitting: Adult Health

## 2014-01-06 ENCOUNTER — Telehealth: Payer: Self-pay | Admitting: *Deleted

## 2014-01-06 ENCOUNTER — Encounter: Payer: Self-pay | Admitting: Oncology

## 2014-01-06 VITALS — BP 131/83 | HR 85 | Temp 97.9°F | Resp 18 | Ht 64.0 in | Wt 165.1 lb

## 2014-01-06 DIAGNOSIS — C50919 Malignant neoplasm of unspecified site of unspecified female breast: Secondary | ICD-10-CM

## 2014-01-06 DIAGNOSIS — C50419 Malignant neoplasm of upper-outer quadrant of unspecified female breast: Secondary | ICD-10-CM

## 2014-01-06 DIAGNOSIS — M2550 Pain in unspecified joint: Secondary | ICD-10-CM

## 2014-01-06 DIAGNOSIS — Z5112 Encounter for antineoplastic immunotherapy: Secondary | ICD-10-CM

## 2014-01-06 DIAGNOSIS — Z170 Estrogen receptor positive status [ER+]: Secondary | ICD-10-CM

## 2014-01-06 DIAGNOSIS — Z9010 Acquired absence of unspecified breast and nipple: Secondary | ICD-10-CM

## 2014-01-06 LAB — CBC WITH DIFFERENTIAL/PLATELET
BASO%: 0.6 % (ref 0.0–2.0)
Basophils Absolute: 0 10*3/uL (ref 0.0–0.1)
EOS ABS: 0.1 10*3/uL (ref 0.0–0.5)
EOS%: 1.6 % (ref 0.0–7.0)
HCT: 40.5 % (ref 34.8–46.6)
HGB: 13.7 g/dL (ref 11.6–15.9)
LYMPH%: 41.6 % (ref 14.0–49.7)
MCH: 27.6 pg (ref 25.1–34.0)
MCHC: 33.8 g/dL (ref 31.5–36.0)
MCV: 81.7 fL (ref 79.5–101.0)
MONO#: 0.4 10*3/uL (ref 0.1–0.9)
MONO%: 8.3 % (ref 0.0–14.0)
NEUT%: 47.9 % (ref 38.4–76.8)
NEUTROS ABS: 2.4 10*3/uL (ref 1.5–6.5)
Platelets: 178 10*3/uL (ref 145–400)
RBC: 4.96 10*6/uL (ref 3.70–5.45)
RDW: 14 % (ref 11.2–14.5)
WBC: 5.1 10*3/uL (ref 3.9–10.3)
lymph#: 2.1 10*3/uL (ref 0.9–3.3)
nRBC: 0 % (ref 0–0)

## 2014-01-06 LAB — COMPREHENSIVE METABOLIC PANEL (CC13)
ALK PHOS: 56 U/L (ref 40–150)
ALT: 28 U/L (ref 0–55)
AST: 25 U/L (ref 5–34)
Albumin: 3.4 g/dL — ABNORMAL LOW (ref 3.5–5.0)
Anion Gap: 9 mEq/L (ref 3–11)
BILIRUBIN TOTAL: 0.34 mg/dL (ref 0.20–1.20)
BUN: 26.7 mg/dL — ABNORMAL HIGH (ref 7.0–26.0)
CO2: 26 mEq/L (ref 22–29)
CREATININE: 0.6 mg/dL (ref 0.6–1.1)
Calcium: 9.1 mg/dL (ref 8.4–10.4)
Chloride: 105 mEq/L (ref 98–109)
Glucose: 88 mg/dl (ref 70–140)
Potassium: 4.2 mEq/L (ref 3.5–5.1)
SODIUM: 140 meq/L (ref 136–145)
Total Protein: 6.6 g/dL (ref 6.4–8.3)

## 2014-01-06 MED ORDER — SODIUM CHLORIDE 0.9 % IJ SOLN
10.0000 mL | INTRAMUSCULAR | Status: DC | PRN
  Administered 2014-01-06: 10 mL
  Filled 2014-01-06: qty 10

## 2014-01-06 MED ORDER — HEPARIN SOD (PORK) LOCK FLUSH 100 UNIT/ML IV SOLN
500.0000 [IU] | Freq: Once | INTRAVENOUS | Status: AC | PRN
  Administered 2014-01-06: 500 [IU]
  Filled 2014-01-06: qty 5

## 2014-01-06 MED ORDER — ACETAMINOPHEN 325 MG PO TABS: 650.0000 mg | ORAL_TABLET | Freq: Once | ORAL | Status: DC

## 2014-01-06 MED ORDER — SODIUM CHLORIDE 0.9 % IV SOLN
Freq: Once | INTRAVENOUS | Status: AC
  Administered 2014-01-06: 10:00:00 via INTRAVENOUS

## 2014-01-06 MED ORDER — TRASTUZUMAB CHEMO INJECTION 440 MG
6.0000 mg/kg | Freq: Once | INTRAVENOUS | Status: AC
  Administered 2014-01-06: 420 mg via INTRAVENOUS
  Filled 2014-01-06: qty 20

## 2014-01-06 NOTE — Telephone Encounter (Signed)
Per staff phone call and POF I have schedueld appts.  JMW  

## 2014-01-06 NOTE — Progress Notes (Addendum)
[place]  [phone] [phone]) [phone]  OFFICE PROGRESS NOTE   CC: Sheela Stack, MD [address] [place] [address] Dr. Rolm Bookbinder Dr. Arloa Koh Dr. Mickel Duhamel Dr. Kyung Rudd   DIAGNOSIS: 58 year old female who presented on 12/24/2012 which new diagnosis of invasive ductal carcinoma, ER positive, PR positive, HER-2/neu positive in the left breast.     PRIOR THERAPY: #1  The patient has multiple medical problems but recently she had a mammogram performed that showed a 5.4 cm area of calcifications in the left breast.  MRI of the breasts showed the area in her left breast in the upper outer quadrant extended to about 7.2 cm. Needle core biopsy of the left breast showed a high-grade DCIS that was ER positive PR positive.  #2  She was seen by Dr. Rolm Bookbinder regarding surgical options. The patient wanted to have bilateral mastectomies.  She is now status post bilateral mastectomy on 12/31/2012. The final pathology did reveal a 2.3 cm invasive ductal carcinoma in the left breast. The tumor was ER +100%, PR +86%,  HER-2/neu was amplified with a ratio of 6.81, Ki-67 was elevated at 46%. She is in the process of completing reconstruction.  #3 The patient was seen again in medical oncology for discussion of adjuvant treatment. Since her tumor was HER-2/neu positive, we recommended anti-HER-2/neu- based therapy consisting of Taxotere/Carboplatinum/Herceptin. The Taxotere and Carboplatinum was given every 3 weeks for 5 of 6 cycles. She received Neulasta on day 2. Herceptin was given weekly during the duration of chemotherapy and then every 3 weeks to finish out a one-year of treatment. She received systemic therapy with [place] from 02/04/2013 through 05/13/2013.  #4  The patient will continue Herceptin every 3 weeks to finish out 1 year.  #5  She began antiestrogen therapy with Letrozole 2.5 mg daily starting 06/03/2013, however patient began  having aches and pains. We discontinued. Patient was then prescribed aromasin 25 mg daily, however she hasn't started taking it.   CURRENT THERAPY:  Q3 weekly Herceptin therapy/aromasin 25 mg daily (patient not taking)  INTERVAL HISTORY:   [name] is here for follow up prior to Herceptin therapy today.  She continues to have difficulty with joint aches and pains and has not yet started Aromasin.  She plans on starting after she completes Herceptin therapy.  At her last appointment, she had Lovett Sox, our pharmacist evaluate how many more Herceptin treatments she was supposed to get and he calculated it out to finish on 01/27/14.  She has had f/u with Dr. Migdalia Dk, who has delayed her implant surgery due to where her port is, and will reschedule when the Herceptin is completed so the port can be removed at the same time.  She had a CT chest on 11/26/14, and it was negative for any breast cancer recurrence or metastases, but it did show, mild nodular enlargement of her left adrenal gland.  She saw her PCP, Dr. Roque Cash for this who is also an endocrinologist and he did a blood and saliva cortisol test and it was elevated.  A 24 hour urine was collected this past Monday and these results are pending.  Her last echo was on 12/06/13, and it was normal.  She denies fever, chills, shortness of breath, DOE, orthopnea, swelling, her breasts are healed and have no erythema, drainage, or any other concerns.     MEDICAL HISTORY: Past Medical History  Diagnosis Date  . IBS (irritable bowel syndrome)   .  ADD (attention deficit disorder)   . Lyme disease   . Contact lens/glasses fitting     wears contacts or glasses  . Allergy   . Hypothyroidism   . Tricuspid regurgitation     "from the chemo" (09/29/2013)  . Exertional shortness of breath     "probably from the Cerpetiian I'm taking" (09/29/2013)  . Arthritis     "hands" (09/29/2013)  . Breast cancer 12/04/12    left, ER/PR +  . Cervical cancer      ALLERGIES:  is allergic to betadine; iodine; shellfish-derived products; sulfamethoxazole-trimethoprim; cleocin; and penicillins.  MEDICATIONS:  Current Outpatient Prescriptions  Medication Sig Dispense Refill  . albuterol (PROVENTIL HFA;VENTOLIN HFA) 108 (90 BASE) MCG/ACT inhaler Inhale 2 puffs into the lungs every 6 (six) hours as needed for shortness of breath.  2 Inhaler  6  . aspirin 81 MG tablet Take 81 mg by mouth daily.      . B Complex-C (B-COMPLEX WITH VITAMIN C) tablet Take 1 tablet by mouth daily.      [name] dextroamphetamine (DEXTROSTAT) 5 MG tablet Take 5 mg by mouth 2 (two) times daily.       . diazepam (VALIUM) 2 MG tablet Take 1 tablet (2 mg total) by mouth every 12 (twelve) hours as needed.  30 tablet  0  . eszopiclone (LUNESTA) 2 MG TABS tablet Take 1 tablet (2 mg total) by mouth at bedtime. Take immediately before bedtime  30 tablet  5  . levothyroxine (SYNTHROID, LEVOTHROID) 100 MCG tablet Take 1 tablet (100 mcg total) by mouth daily.  30 tablet  6  . lidocaine-prilocaine (EMLA) cream Apply 1 application topically as needed (for port-a-cath access).  30 g  2  . Multiple Vitamin (MULTIVITAMIN WITH MINERALS) TABS Take 1 tablet by mouth daily.      . vitamin C (ASCORBIC ACID) 500 MG tablet Take 500 mg by mouth daily.      . vitamin E (VITAMIN E) 400 UNIT capsule Take 800 Units by mouth daily.      [name] EPINEPHrine (EPIPEN) 0.3 mg/0.3 mL DEVI Inject 0.3 mg into the muscle as needed (for allergic reaction).       [name] exemestane (AROMASIN) 25 MG tablet Take 1 tablet (25 mg total) by mouth daily after breakfast.  30 tablet  12  . loratadine (CLARITIN) 10 MG tablet Take 10 mg by mouth daily as needed (for 5 days after Neulasta injection).      . LORazepam (ATIVAN) 1 MG tablet Take 1 tablet (1 mg total) by mouth every 8 (eight) hours as needed for anxiety.  60 tablet  0   No current facility-administered medications for this visit.    SURGICAL HISTORY:  Past Surgical History   Procedure Laterality Date  . Brain surgery      removal benign frontal lobe cyst in 1987  . Foot surgery Bilateral 2013    "toes were webbed together; had them separated" (09/29/2013)  . Gynecologic cryosurgery      cervical cancer, 1998 last remained disease free with recent normal pap  . Diagnostic laparoscopy  2004    lysis adh-uterine ablation  . Breast enhancement surgery Bilateral ~ 2008  . Breast surgery Left     removal benign left breast lesion  . Facial cosmetic surgery  ~ 2010  . Axillary sentinel node biopsy  12/31/2012    Procedure: AXILLARY SENTINEL NODE BIOPSY;  Surgeon: Rolm Bookbinder, MD;  Location: [facility];  Service: General;  Laterality: Bilateral;  bilateral sentinel node  . Total mastectomy  12/31/2012    Procedure: TOTAL MASTECTOMY;  Surgeon: Emelia Loron, MD;  Location: [facility];  Service: General;  Laterality: Bilateral;  bilateral total mastectomies  . Breast reconstruction with placement of tissue expander and flex hd (acellular hydrated dermis)  12/31/2012    Procedure: BREAST RECONSTRUCTION WITH PLACEMENT OF TISSUE EXPANDER AND FLEX HD (ACELLULAR HYDRATED DERMIS);  Surgeon: Wayland Denis, DO;  Location: [facility];  Service: Plastics;  Laterality: Bilateral;  bilateral immediate breast reconstruction with expanders and flex hd   . Portacath placement  01/27/2013    Procedure: INSERTION PORT-A-CATH;  Surgeon: Emelia Loron, MD;  Location: [facility];  Service: General;  Laterality: Right;  . Incision and drainage of wound Left 04/28/2013    Procedure: IRRIGATION AND DEBRIDEMENT LEFT BREAST WOUND, POSSIBLE CLOSURE OF WOUND WITH DRAIN PLACEMENT;  Surgeon: Wayland Denis, DO;  Location: [facility];  Service: Plastics;  Laterality: Left;  . Tee without cardioversion N/A 06/04/2013    Procedure: TRANSESOPHAGEAL ECHOCARDIOGRAM (TEE);  Surgeon: Dolores Patty, MD;  Location: [facility];   Service: Cardiovascular;  Laterality: N/A;  . Tissue expander placement Left 06/16/2013    Procedure: LEFT BREAST TISSUE EXPANDER REMOVAL;  Surgeon: Wayland Denis, DO;  Location: [facility];  Service: Plastics;  Laterality: Left;  . Reconstruction breast w/ latissimus dorsi flap Left 09/29/2013  . Tissue expander placement Left 09/29/2013  . Abdominal hysterectomy  2005    TAH/BSO-lysis adh, enometriosis  . Dilation and curettage of uterus  2005  . Cervical cerclage    . Latissimus flap to breast Left 09/29/2013    Procedure: LATISSIMUS FLAP TO BREAST WITH TISSUE EXPANDER PLACEMENT FOR LEFT BREAST RECONSTRUCTION;  Surgeon: Wayland Denis, DO;  Location: [facility];  Service: Plastics;  Laterality: Left;  . Tissue expander placement Left 09/29/2013    Procedure: TISSUE EXPANDER;  Surgeon: Wayland Denis, DO;  Location: [facility];  Service: Plastics;  Laterality: Left;    REVIEW OF SYSTEMS:  A 10 point review of systems was conducted and is otherwise negative except for what is noted above.     PHYSICAL EXAMINATION: Blood pressure 131/83, pulse 85, temperature 97.9 F (36.6 C), temperature source Oral, resp. rate 18, height 5\' 4"  (1.626 m), weight 165 lb 1.6 oz (74.889 kg). Body mass index is 28.33 kg/(m^2). GENERAL: Patient is a well appearing female in no acute distress HEENT:  Sclerae anicteric.  Oropharynx clear and moist. No ulcerations or evidence of oropharyngeal candidiasis. Neck is supple.  NODES:  No cervical, supraclavicular, or axillary lymphadenopathy palpated.  BREAST EXAM:  Deferred. LUNGS:  Clear to auscultation bilaterally.  No wheezes or rhonchi. HEART:  Regular rate and rhythm. No murmur appreciated. ABDOMEN:  Soft, nontender.  Positive, normoactive bowel sounds. No organomegaly palpated. MSK:  No focal spinal tenderness to palpation. Full range of motion bilaterally in the upper extremities. EXTREMITIES:  No peripheral edema.   SKIN:  Clear with no obvious rashes or  skin changes. No nail dyscrasia. NEURO:  Nonfocal. Well oriented.  Appropriate affect. ECOG PERFORMANCE STATUS: 1 - Symptomatic but completely ambulatory   LABORATORY DATA: Lab Results  Component Value Date   WBC 5.1 01/06/2014   HGB 13.7 01/06/2014   HCT 40.5 01/06/2014   MCV 81.7 01/06/2014   PLT 178 01/06/2014      Chemistry      Component Value Date/Time   NA 140 01/06/2014 0809  NA 137 09/21/2013 0842   K 4.2 01/06/2014 0809   K 4.4 09/21/2013 0842   CL 101 09/21/2013 0842   CL 104 06/10/2013 1312   CO2 26 01/06/2014 0809   CO2 25 09/21/2013 0842   BUN 26.7* 01/06/2014 0809   BUN 26* 09/21/2013 0842   CREATININE 0.6 01/06/2014 0809   CREATININE 0.51 09/29/2013 2008      Component Value Date/Time   CALCIUM 9.1 01/06/2014 0809   CALCIUM 9.4 09/21/2013 0842   ALKPHOS 56 01/06/2014 0809   ALKPHOS 52 09/21/2013 0842   AST 25 01/06/2014 0809   AST 29 09/21/2013 0842   ALT 28 01/06/2014 0809   ALT 24 09/21/2013 0842   BILITOT 0.34 01/06/2014 0809   BILITOT 0.3 09/21/2013 0842      STUDIES: No results found.   ASSESSMENT: 58 year old [place] woman:  (1) Status post bilateral mastectomies on 12/31/2012 showing:  (a) On the right, no evidence of malignancy  (b) On the left, a pT2 pN0, stage IIA invasive ductal carcinoma, grade 2, estrogen receptor 100% positive, progesterone receptor 86% positive, with an MIB-1 of 46%, and HER-2 amplification with a CISH ratio of 6.81.  (2) Received adjuvant chemotherapy with Carboplatin/Docetaxel/Trastuzumab from 02/04/2013 through 05/13/2013 and was scheduled to consist of 6 cycles.  Unfortunately, the patient was unable to tolerate the entire course of chemotherapy and subsequently she requested that we discontinued the last cycle of her treatment which would have been cycle #6.  She received a total of 5 of 6 chemotherapy cycles.  She continues to receive every 3 week Trastuzumab (Herceptin).   (3) Currently not taking adjuvant  antiestrogen therapy.  She was taking Letrozole, but had difficulty with joint pain, she was given Aromasin to try but she hasn't started it yet.  This was discussed in detail, and she declined taking Aromasin or Tamoxifen until she has completed Herceptin therapy.    (4) Ongoing arthralgias/myalgias   PLAN:   #1 Patient is doing well.  She will proceed with Herceptin therapy today.  Her CBC is stable.  I reviewed it with her in detail.    #2 She and I discussed the anti-estrogen therapy.  She will restart once she finishes Herceptin therapy.   #3  She will undergo reconstruction at the completion of Herceptin.  At that time her port will be removed by Dr. Migdalia Dk.   #4  She will return in 3 weeks for her next lab, evaluation, and Herceptin treatment.    All questions were answered.  [name] knows to call the clinic with any problems, questions or concerns. We can certainly see her  much sooner if necessary.  I spent 25 minutes counseling the patient face to face.  The total time spent in the appointment was 30 minutes.  Minette Headland, [place] [phone] 01/08/2014, 8:52 AM  ATTENDING'S ATTESTATION:  I personally reviewed patient's chart, examined patient myself, formulated the treatment plan as followed.    Overall doing well, we will proceed with herceptin. Patient not taking anti-estrogen currently due aches and pains. She wants to start after completion of herceptin. She complete this in March.  Marcy Panning, MD Medical/Oncology [facility] [phone] (beeper) [phone] (Office)  01/15/2014, 3:40 PM

## 2014-01-06 NOTE — Patient Instructions (Signed)
[facility] Discharge Instructions for Patients Receiving Chemotherapy  Today you received the following chemotherapy agents Herceptin.  To help prevent nausea and vomiting after your treatment, we encourage you to take your nausea medication as prescribed.   If you develop nausea and vomiting that is not controlled by your nausea medication, call the clinic.   BELOW ARE SYMPTOMS THAT SHOULD BE REPORTED IMMEDIATELY:  *FEVER GREATER THAN 100.5 F  *CHILLS WITH OR WITHOUT FEVER  NAUSEA AND VOMITING THAT IS NOT CONTROLLED WITH YOUR NAUSEA MEDICATION  *UNUSUAL SHORTNESS OF BREATH  *UNUSUAL BRUISING OR BLEEDING  TENDERNESS IN MOUTH AND THROAT WITH OR WITHOUT PRESENCE OF ULCERS  *URINARY PROBLEMS  *BOWEL PROBLEMS  UNUSUAL RASH Items with * indicate a potential emergency and should be followed up as soon as possible.  Feel free to call the clinic you have any questions or concerns. The clinic phone number is [phone].    

## 2014-01-27 ENCOUNTER — Ambulatory Visit (HOSPITAL_BASED_OUTPATIENT_CLINIC_OR_DEPARTMENT_OTHER): Payer: 59

## 2014-01-27 ENCOUNTER — Other Ambulatory Visit (HOSPITAL_BASED_OUTPATIENT_CLINIC_OR_DEPARTMENT_OTHER): Payer: 59

## 2014-01-27 ENCOUNTER — Ambulatory Visit (HOSPITAL_BASED_OUTPATIENT_CLINIC_OR_DEPARTMENT_OTHER): Payer: 59 | Admitting: Adult Health

## 2014-01-27 ENCOUNTER — Encounter: Payer: Self-pay | Admitting: Adult Health

## 2014-01-27 VITALS — BP 137/78 | HR 82 | Temp 97.8°F | Resp 18 | Ht 64.0 in | Wt 165.5 lb

## 2014-01-27 DIAGNOSIS — C50419 Malignant neoplasm of upper-outer quadrant of unspecified female breast: Secondary | ICD-10-CM

## 2014-01-27 DIAGNOSIS — Z5112 Encounter for antineoplastic immunotherapy: Secondary | ICD-10-CM

## 2014-01-27 DIAGNOSIS — D0510 Intraductal carcinoma in situ of unspecified breast: Secondary | ICD-10-CM

## 2014-01-27 DIAGNOSIS — Z170 Estrogen receptor positive status [ER+]: Secondary | ICD-10-CM

## 2014-01-27 DIAGNOSIS — E039 Hypothyroidism, unspecified: Secondary | ICD-10-CM

## 2014-01-27 LAB — CBC WITH DIFFERENTIAL/PLATELET
BASO%: 0.5 % (ref 0.0–2.0)
Basophils Absolute: 0 10*3/uL (ref 0.0–0.1)
EOS%: 1.7 % (ref 0.0–7.0)
Eosinophils Absolute: 0.1 10*3/uL (ref 0.0–0.5)
HCT: 41.5 % (ref 34.8–46.6)
HGB: 13.8 g/dL (ref 11.6–15.9)
LYMPH%: 46.1 % (ref 14.0–49.7)
MCH: 27 pg (ref 25.1–34.0)
MCHC: 33.3 g/dL (ref 31.5–36.0)
MCV: 81.2 fL (ref 79.5–101.0)
MONO#: 0.5 10*3/uL (ref 0.1–0.9)
MONO%: 8 % (ref 0.0–14.0)
NEUT#: 2.9 10*3/uL (ref 1.5–6.5)
NEUT%: 43.7 % (ref 38.4–76.8)
Platelets: 210 10*3/uL (ref 145–400)
RBC: 5.11 10*6/uL (ref 3.70–5.45)
RDW: 13.9 % (ref 11.2–14.5)
WBC: 6.6 10*3/uL (ref 3.9–10.3)
lymph#: 3.1 10*3/uL (ref 0.9–3.3)

## 2014-01-27 LAB — COMPREHENSIVE METABOLIC PANEL (CC13)
ALK PHOS: 65 U/L (ref 40–150)
ALT: 28 U/L (ref 0–55)
AST: 26 U/L (ref 5–34)
Albumin: 3.8 g/dL (ref 3.5–5.0)
Anion Gap: 8 mEq/L (ref 3–11)
BUN: 28.2 mg/dL — ABNORMAL HIGH (ref 7.0–26.0)
CALCIUM: 9.7 mg/dL (ref 8.4–10.4)
CHLORIDE: 104 meq/L (ref 98–109)
CO2: 29 mEq/L (ref 22–29)
CREATININE: 0.7 mg/dL (ref 0.6–1.1)
Glucose: 95 mg/dl (ref 70–140)
POTASSIUM: 4.8 meq/L (ref 3.5–5.1)
Sodium: 141 mEq/L (ref 136–145)
Total Bilirubin: 0.32 mg/dL (ref 0.20–1.20)
Total Protein: 7 g/dL (ref 6.4–8.3)

## 2014-01-27 MED ORDER — SODIUM CHLORIDE 0.9 % IJ SOLN
10.0000 mL | INTRAMUSCULAR | Status: DC | PRN
  Administered 2014-01-27: 10 mL
  Filled 2014-01-27: qty 10

## 2014-01-27 MED ORDER — TRASTUZUMAB CHEMO INJECTION 440 MG
6.0000 mg/kg | Freq: Once | INTRAVENOUS | Status: AC
  Administered 2014-01-27: 420 mg via INTRAVENOUS
  Filled 2014-01-27: qty 20

## 2014-01-27 MED ORDER — ACETAMINOPHEN 325 MG PO TABS
650.0000 mg | ORAL_TABLET | Freq: Once | ORAL | Status: AC
  Administered 2014-01-27: 650 mg via ORAL

## 2014-01-27 MED ORDER — SODIUM CHLORIDE 0.9 % IV SOLN
Freq: Once | INTRAVENOUS | Status: AC
  Administered 2014-01-27: 15:00:00 via INTRAVENOUS

## 2014-01-27 MED ORDER — HEPARIN SOD (PORK) LOCK FLUSH 100 UNIT/ML IV SOLN
500.0000 [IU] | Freq: Once | INTRAVENOUS | Status: AC | PRN
  Administered 2014-01-27: 500 [IU]
  Filled 2014-01-27: qty 5

## 2014-01-27 MED ORDER — ACETAMINOPHEN 325 MG PO TABS
ORAL_TABLET | ORAL | Status: AC
  Filled 2014-01-27: qty 2

## 2014-01-27 NOTE — Progress Notes (Addendum)
Jane Gutierrez  Telephone:([phone]) [phone] [phone]) [phone]  OFFICE PROGRESS NOTE   CC: Jane Stack, MD [address] [place] [address] Dr. Rolm Bookbinder Dr. Arloa Koh Dr. Mickel Duhamel Dr. Kyung Rudd   DIAGNOSIS: 58 year old female who presented on 12/24/2012 which new diagnosis of invasive ductal carcinoma, ER positive, PR positive, HER-2/neu positive in the left breast.     PRIOR THERAPY: #1  The patient has multiple medical problems but recently she had a mammogram performed that showed a 5.4 cm area of calcifications in the left breast.  MRI of the breasts showed the area in her left breast in the upper outer quadrant extended to about 7.2 cm. Needle core biopsy of the left breast showed a high-grade DCIS that was ER positive PR positive.  #2  She was seen by Dr. Rolm Bookbinder regarding surgical options. The patient wanted to have bilateral mastectomies.  She is now status post bilateral mastectomy on 12/31/2012. The final pathology did reveal a 2.3 cm invasive ductal carcinoma in the left breast. The tumor was ER +100%, PR +86%,  HER-2/neu was amplified with a ratio of 6.81, Ki-67 was elevated at 46%. She is in the process of completing reconstruction.  #3 The patient was seen again in medical oncology for discussion of adjuvant treatment. Since her tumor was HER-2/neu positive, we recommended anti-HER-2/neu- based therapy consisting of Taxotere/Carboplatinum/Herceptin. The Taxotere and Carboplatinum was given every 3 weeks for 5 of 6 cycles. She received Neulasta on day 2. Herceptin was given weekly during the duration of chemotherapy and then every 3 weeks to finish out a one-year of treatment. She received systemic therapy with [place] from 02/04/2013 through 05/13/2013.  #4  The patient will continue Herceptin every 3 weeks to finish out 1 year.  #5  She began antiestrogen therapy with Letrozole 2.5 mg daily starting 06/03/2013, however patient began  having aches and pains. We discontinued. Patient was then prescribed aromasin 25 mg daily, however she hasn't started taking it.   CURRENT THERAPY:  Q3 weekly Herceptin therapy/aromasin 25 mg daily (patient not taking)  INTERVAL HISTORY:   Jane Gutierrez is here for follow up prior to Herceptin therapy today.  She is doing well today.  She continues to have joint aches and pains and is requesting a rheumatology work up.  She continues to tolerate herceptin  Well.  She does have slightl increase in swelling in her lower extremities.  She denies fevers, chills, nausea, vomiting, constipation, orthopnea, PND, DOE or any other concerns.     MEDICAL HISTORY: Past Medical History  Diagnosis Date  . IBS (irritable bowel syndrome)   . ADD (attention deficit disorder)   . Lyme disease   . Contact lens/glasses fitting     wears contacts or glasses  . Allergy   . Hypothyroidism   . Tricuspid regurgitation     "from the chemo" (09/29/2013)  . Exertional shortness of breath     "probably from the Cerpetiian I'm taking" (09/29/2013)  . Arthritis     "hands" (09/29/2013)  . Breast cancer 12/04/12    left, ER/PR +  . Cervical cancer     ALLERGIES:  is allergic to betadine; iodine; shellfish-derived products; sulfamethoxazole-trimethoprim; cleocin; and penicillins.  MEDICATIONS:  Current Outpatient Prescriptions  Medication Sig Dispense Refill  . albuterol (PROVENTIL HFA;VENTOLIN HFA) 108 (90 BASE) MCG/ACT inhaler Inhale 2 puffs into the lungs every 6 (six) hours as needed for shortness of breath.  2 Inhaler  6  . aspirin  81 MG tablet Take 81 mg by mouth daily.      . B Complex-C (B-COMPLEX WITH VITAMIN C) tablet Take 1 tablet by mouth daily.      [name] dextroamphetamine (DEXTROSTAT) 5 MG tablet Take 5 mg by mouth 2 (two) times daily.       . eszopiclone (LUNESTA) 2 MG TABS tablet Take 1 tablet (2 mg total) by mouth at bedtime. Take immediately before bedtime  30 tablet  5  . levothyroxine (SYNTHROID,  LEVOTHROID) 100 MCG tablet Take 1 tablet (100 mcg total) by mouth daily.  30 tablet  6  . lidocaine-prilocaine (EMLA) cream Apply 1 application topically as needed (for port-a-cath access).  30 g  2  . Multiple Vitamin (MULTIVITAMIN WITH MINERALS) TABS Take 1 tablet by mouth daily.      . vitamin C (ASCORBIC ACID) 500 MG tablet Take 500 mg by mouth daily.      . vitamin E (VITAMIN E) 400 UNIT capsule Take 800 Units by mouth daily.      . diazepam (VALIUM) 2 MG tablet Take 1 tablet (2 mg total) by mouth every 12 (twelve) hours as needed.  30 tablet  0  . EPINEPHrine (EPIPEN) 0.3 mg/0.3 mL DEVI Inject 0.3 mg into the muscle as needed (for allergic reaction).       [name] exemestane (AROMASIN) 25 MG tablet Take 1 tablet (25 mg total) by mouth daily after breakfast.  30 tablet  12  . LORazepam (ATIVAN) 1 MG tablet Take 1 tablet (1 mg total) by mouth every 8 (eight) hours as needed for anxiety.  60 tablet  0   No current facility-administered medications for this visit.    SURGICAL HISTORY:  Past Surgical History  Procedure Laterality Date  . Brain surgery      removal benign frontal lobe cyst in 1987  . Foot surgery Bilateral 2013    "toes were webbed together; had them separated" (09/29/2013)  . Gynecologic cryosurgery      cervical cancer, 1998 last remained disease free with recent normal pap  . Diagnostic laparoscopy  2004    lysis adh-uterine ablation  . Breast enhancement surgery Bilateral ~ 2008  . Breast surgery Left     removal benign left breast lesion  . Facial cosmetic surgery  ~ 2010  . Axillary sentinel node biopsy  12/31/2012    Procedure: AXILLARY SENTINEL NODE BIOPSY;  Surgeon: Rolm Bookbinder, MD;  Location: [facility];  Service: General;  Laterality: Bilateral;  bilateral sentinel node  . Total mastectomy  12/31/2012    Procedure: TOTAL MASTECTOMY;  Surgeon: Rolm Bookbinder, MD;  Location: [facility];  Service: General;  Laterality: Bilateral;   bilateral total mastectomies  . Breast reconstruction with placement of tissue expander and flex hd (acellular hydrated dermis)  12/31/2012    Procedure: BREAST RECONSTRUCTION WITH PLACEMENT OF TISSUE EXPANDER AND FLEX HD (ACELLULAR HYDRATED DERMIS);  Surgeon: Theodoro Kos, DO;  Location: [facility];  Service: Plastics;  Laterality: Bilateral;  bilateral immediate breast reconstruction with expanders and flex hd   . Portacath placement  01/27/2013    Procedure: INSERTION PORT-A-CATH;  Surgeon: Rolm Bookbinder, MD;  Location: [facility];  Service: General;  Laterality: Right;  . Incision and drainage of wound Left 04/28/2013    Procedure: IRRIGATION AND DEBRIDEMENT LEFT BREAST WOUND, POSSIBLE CLOSURE OF WOUND WITH DRAIN PLACEMENT;  Surgeon: Theodoro Kos, DO;  Location: [facility];  Service: Plastics;  Laterality:  Left;  . Tee without cardioversion N/A 06/04/2013    Procedure: TRANSESOPHAGEAL ECHOCARDIOGRAM (TEE);  Surgeon: Jolaine Artist, MD;  Location: [facility] ENDOSCOPY;  Service: Cardiovascular;  Laterality: N/A;  . Tissue expander placement Left 06/16/2013    Procedure: LEFT BREAST TISSUE EXPANDER REMOVAL;  Surgeon: Theodoro Kos, DO;  Location: [facility];  Service: Plastics;  Laterality: Left;  . Reconstruction breast w/ latissimus dorsi flap Left 09/29/2013  . Tissue expander placement Left 09/29/2013  . Abdominal hysterectomy  2005    TAH/BSO-lysis adh, enometriosis  . Dilation and curettage of uterus  2005  . Cervical cerclage    . Latissimus flap to breast Left 09/29/2013    Procedure: LATISSIMUS FLAP TO BREAST WITH TISSUE EXPANDER PLACEMENT FOR LEFT BREAST RECONSTRUCTION;  Surgeon: Theodoro Kos, DO;  Location: [facility];  Service: Plastics;  Laterality: Left;  . Tissue expander placement Left 09/29/2013    Procedure: TISSUE EXPANDER;  Surgeon: Theodoro Kos, DO;  Location: [facility];  Service: Plastics;  Laterality: Left;    REVIEW OF  SYSTEMS:  A 10 point review of systems was conducted and is otherwise negative except for what is noted above.     PHYSICAL EXAMINATION: Blood pressure 137/78, pulse 82, temperature 97.8 F (36.6 C), temperature source Oral, resp. rate 18, height $RemoveBe'5\' 4"'CfvrANoJq$  (1.626 m), weight 165 lb 8 oz (75.07 kg). Body mass index is 28.39 kg/(m^2). GENERAL: Patient is a well appearing female in no acute distress HEENT:  Sclerae anicteric.  Oropharynx clear and moist. No ulcerations or evidence of oropharyngeal candidiasis. Neck is supple.  NODES:  No cervical, supraclavicular, or axillary lymphadenopathy palpated.  BREAST EXAM:  Deferred. LUNGS:  Clear to auscultation bilaterally.  No wheezes or rhonchi. HEART:  Regular rate and rhythm. No murmur appreciated. ABDOMEN:  Soft, nontender.  Positive, normoactive bowel sounds. No organomegaly palpated. MSK:  No focal spinal tenderness to palpation. Full range of motion bilaterally in the upper extremities. EXTREMITIES:  No peripheral edema seen.   SKIN:  Clear with no obvious rashes or skin changes. No nail dyscrasia. NEURO:  Nonfocal. Well oriented.  Appropriate affect. ECOG PERFORMANCE STATUS: 1 - Symptomatic but completely ambulatory   LABORATORY DATA: Lab Results  Component Value Date   WBC 6.6 01/27/2014   HGB 13.8 01/27/2014   HCT 41.5 01/27/2014   MCV 81.2 01/27/2014   PLT 210 01/27/2014      Chemistry      Component Value Date/Time   NA 141 01/27/2014 1236   NA 137 09/21/2013 0842   K 4.8 01/27/2014 1236   K 4.4 09/21/2013 0842   CL 101 09/21/2013 0842   CL 104 06/10/2013 1312   CO2 29 01/27/2014 1236   CO2 25 09/21/2013 0842   BUN 28.2* 01/27/2014 1236   BUN 26* 09/21/2013 0842   CREATININE 0.7 01/27/2014 1236   CREATININE 0.51 09/29/2013 2008      Component Value Date/Time   CALCIUM 9.7 01/27/2014 1236   CALCIUM 9.4 09/21/2013 0842   ALKPHOS 65 01/27/2014 1236   ALKPHOS 52 09/21/2013 0842   AST 26 01/27/2014 1236   AST 29 09/21/2013 0842   ALT 28 01/27/2014 1236    ALT 24 09/21/2013 0842   BILITOT 0.32 01/27/2014 1236   BILITOT 0.3 09/21/2013 0842      STUDIES: No results found.   ASSESSMENT: 58 year old [place] woman:  (1) Status post bilateral mastectomies on 12/31/2012 showing:  (a) On the right, no evidence of malignancy  (b)  On the left, a pT2 pN0, stage IIA invasive ductal carcinoma, grade 2, estrogen receptor 100% positive, progesterone receptor 86% positive, with an MIB-1 of 46%, and HER-2 amplification with a CISH ratio of 6.81.  (2) Received adjuvant chemotherapy with Carboplatin/Docetaxel/Trastuzumab from 02/04/2013 through 05/13/2013 and was scheduled to consist of 6 cycles.  Unfortunately, the patient was unable to tolerate the entire course of chemotherapy and subsequently she requested that we discontinued the last cycle of her treatment which would have been cycle #6.  She received a total of 5 of 6 chemotherapy cycles.  She continues to receive every 3 week Trastuzumab (Herceptin).   (3) Currently not taking adjuvant antiestrogen therapy.  She was taking Letrozole, but had difficulty with joint pain, she was given Aromasin to try but she hasn't started it yet.  This was discussed in detail, and she declined taking Aromasin or Tamoxifen until she has completed Herceptin therapy.    (4) Ongoing arthralgias/myalgias   PLAN:   #1 Patient is doing well.  She will proceed with Herceptin therapy today.  Her CBC is stable.  I reviewed it with her in detail.  Should her swelling in her lower extremities increase or should she develop any further symptoms such as DOE, orthopnea, or any other concerns I recommended she call our office.    #2 She and I again discussed the anti-estrogen therapy.  She will restart once she finishes Herceptin therapy.   #3  She will undergo reconstruction at the completion of Herceptin.  At that time her port will be removed by Dr. Migdalia Dk.   #4  She will return in 3 weeks for her next lab,  evaluation, and Herceptin treatment.    #5 I recommended she see her PCP for a rheumatology work up or referral.    All questions were answered.  Jane Gutierrez knows to call the clinic with any problems, questions or concerns. We can certainly see her  much sooner if necessary.  I spent 15 minutes counseling the patient face to face.  The total time spent in the appointment was 30 minutes.  Minette Headland, [place] [phone] 01/29/2014, 2:16 PM

## 2014-01-27 NOTE — Patient Instructions (Signed)
[facility] Discharge Instructions for Patients Receiving Chemotherapy  Today you received the following chemotherapy agents :  Herceptin.  To help prevent nausea and vomiting after your treatment, we encourage you to take your nausea medication as instructed by your physician.   If you develop nausea and vomiting that is not controlled by your nausea medication, call the clinic.   BELOW ARE SYMPTOMS THAT SHOULD BE REPORTED IMMEDIATELY:  *FEVER GREATER THAN 100.5 F  *CHILLS WITH OR WITHOUT FEVER  NAUSEA AND VOMITING THAT IS NOT CONTROLLED WITH YOUR NAUSEA MEDICATION  *UNUSUAL SHORTNESS OF BREATH  *UNUSUAL BRUISING OR BLEEDING  TENDERNESS IN MOUTH AND THROAT WITH OR WITHOUT PRESENCE OF ULCERS  *URINARY PROBLEMS  *BOWEL PROBLEMS  UNUSUAL RASH Items with * indicate a potential emergency and should be followed up as soon as possible.  Feel free to call the clinic you have any questions or concerns. The clinic phone number is [phone].    

## 2014-02-01 DIAGNOSIS — G729 Myopathy, unspecified: Secondary | ICD-10-CM | POA: Insufficient documentation

## 2014-02-07 ENCOUNTER — Encounter: Payer: Self-pay | Admitting: *Deleted

## 2014-02-08 ENCOUNTER — Encounter: Payer: Self-pay | Admitting: *Deleted

## 2014-02-09 ENCOUNTER — Ambulatory Visit: Payer: 59 | Admitting: *Deleted

## 2014-02-10 ENCOUNTER — Encounter: Payer: Self-pay | Admitting: *Deleted

## 2014-02-11 ENCOUNTER — Ambulatory Visit (INDEPENDENT_AMBULATORY_CARE_PROVIDER_SITE_OTHER): Payer: 59 | Admitting: *Deleted

## 2014-02-11 ENCOUNTER — Encounter: Payer: Self-pay | Admitting: Vascular Surgery

## 2014-02-11 DIAGNOSIS — I781 Nevus, non-neoplastic: Secondary | ICD-10-CM

## 2014-02-11 NOTE — Progress Notes (Signed)
X=.3% Sotradecol administered with a 27g butterfly.  Patient received a total of 6cc.  She only had a scattering of small spiders. Easy access. Tol well. Will follow prn.  Photos: no  Compression stockings applied: yes

## 2014-02-16 ENCOUNTER — Ambulatory Visit (HOSPITAL_BASED_OUTPATIENT_CLINIC_OR_DEPARTMENT_OTHER): Payer: 59

## 2014-02-16 ENCOUNTER — Other Ambulatory Visit: Payer: Self-pay | Admitting: Adult Health

## 2014-02-16 VITALS — BP 122/81 | HR 82 | Temp 97.9°F | Resp 20

## 2014-02-16 DIAGNOSIS — C50419 Malignant neoplasm of upper-outer quadrant of unspecified female breast: Secondary | ICD-10-CM

## 2014-02-16 DIAGNOSIS — Z5112 Encounter for antineoplastic immunotherapy: Secondary | ICD-10-CM

## 2014-02-16 LAB — CBC WITH DIFFERENTIAL/PLATELET
BASO%: 0.5 % (ref 0.0–2.0)
Basophils Absolute: 0 10*3/uL (ref 0.0–0.1)
EOS%: 1.1 % (ref 0.0–7.0)
Eosinophils Absolute: 0.1 10*3/uL (ref 0.0–0.5)
HEMATOCRIT: 40.5 % (ref 34.8–46.6)
HGB: 13.7 g/dL (ref 11.6–15.9)
LYMPH#: 2.7 10*3/uL (ref 0.9–3.3)
LYMPH%: 41.9 % (ref 14.0–49.7)
MCH: 27.3 pg (ref 25.1–34.0)
MCHC: 33.8 g/dL (ref 31.5–36.0)
MCV: 80.7 fL (ref 79.5–101.0)
MONO#: 0.6 10*3/uL (ref 0.1–0.9)
MONO%: 9.6 % (ref 0.0–14.0)
NEUT#: 3.1 10*3/uL (ref 1.5–6.5)
NEUT%: 46.9 % (ref 38.4–76.8)
Platelets: 193 10*3/uL (ref 145–400)
RBC: 5.02 10*6/uL (ref 3.70–5.45)
RDW: 13.9 % (ref 11.2–14.5)
WBC: 6.5 10*3/uL (ref 3.9–10.3)
nRBC: 0 % (ref 0–0)

## 2014-02-16 LAB — COMPREHENSIVE METABOLIC PANEL (CC13)
ALK PHOS: 53 U/L (ref 40–150)
ALT: 28 U/L (ref 0–55)
AST: 25 U/L (ref 5–34)
Albumin: 3.5 g/dL (ref 3.5–5.0)
Anion Gap: 8 mEq/L (ref 3–11)
BILIRUBIN TOTAL: 0.35 mg/dL (ref 0.20–1.20)
BUN: 26 mg/dL (ref 7.0–26.0)
CO2: 27 mEq/L (ref 22–29)
CREATININE: 0.7 mg/dL (ref 0.6–1.1)
Calcium: 9.1 mg/dL (ref 8.4–10.4)
Chloride: 105 mEq/L (ref 98–109)
GLUCOSE: 93 mg/dL (ref 70–140)
POTASSIUM: 3.8 meq/L (ref 3.5–5.1)
Sodium: 141 mEq/L (ref 136–145)
Total Protein: 6.7 g/dL (ref 6.4–8.3)

## 2014-02-16 MED ORDER — ACETAMINOPHEN 325 MG PO TABS
650.0000 mg | ORAL_TABLET | Freq: Once | ORAL | Status: AC
  Administered 2014-02-16: 650 mg via ORAL

## 2014-02-16 MED ORDER — TRASTUZUMAB CHEMO INJECTION 440 MG
6.0000 mg/kg | Freq: Once | INTRAVENOUS | Status: AC
  Administered 2014-02-16: 420 mg via INTRAVENOUS
  Filled 2014-02-16: qty 20

## 2014-02-16 MED ORDER — SODIUM CHLORIDE 0.9 % IJ SOLN
10.0000 mL | INTRAMUSCULAR | Status: DC | PRN
  Filled 2014-02-16: qty 10

## 2014-02-16 MED ORDER — ACETAMINOPHEN 325 MG PO TABS
ORAL_TABLET | ORAL | Status: AC
  Filled 2014-02-16: qty 2

## 2014-02-16 MED ORDER — SODIUM CHLORIDE 0.9 % IV SOLN: Freq: Once | INTRAVENOUS | Status: DC

## 2014-02-16 MED ORDER — HEPARIN SOD (PORK) LOCK FLUSH 100 UNIT/ML IV SOLN
250.0000 [IU] | Freq: Once | INTRAVENOUS | Status: DC | PRN
  Filled 2014-02-16: qty 5

## 2014-02-16 MED ORDER — HEPARIN SOD (PORK) LOCK FLUSH 100 UNIT/ML IV SOLN
500.0000 [IU] | Freq: Once | INTRAVENOUS | Status: DC | PRN
  Filled 2014-02-16: qty 5

## 2014-02-17 ENCOUNTER — Other Ambulatory Visit: Payer: 59

## 2014-02-17 ENCOUNTER — Ambulatory Visit: Payer: 59

## 2014-02-17 ENCOUNTER — Ambulatory Visit: Payer: 59 | Admitting: Adult Health

## 2014-02-18 ENCOUNTER — Telehealth: Payer: Self-pay

## 2014-02-18 NOTE — Telephone Encounter (Signed)
Pt reports herceptin finished.  She checked at [facility] - no scrip yet for Tamoxifen.  Also wants to know when she needs to come back for follow up.  Routed to [employer].  TKF

## 2014-02-21 ENCOUNTER — Other Ambulatory Visit: Payer: Self-pay | Admitting: Adult Health

## 2014-02-21 DIAGNOSIS — C50919 Malignant neoplasm of unspecified site of unspecified female breast: Secondary | ICD-10-CM

## 2014-02-21 MED ORDER — TAMOXIFEN CITRATE 20 MG PO TABS
20.0000 mg | ORAL_TABLET | Freq: Every day | ORAL | Status: DC
Start: 2014-02-21 — End: 2014-08-08

## 2014-02-22 ENCOUNTER — Telehealth: Payer: Self-pay | Admitting: Oncology

## 2014-02-22 NOTE — Telephone Encounter (Signed)
, °

## 2014-03-03 ENCOUNTER — Encounter (HOSPITAL_BASED_OUTPATIENT_CLINIC_OR_DEPARTMENT_OTHER): Payer: Self-pay | Admitting: *Deleted

## 2014-03-04 ENCOUNTER — Other Ambulatory Visit: Payer: Self-pay | Admitting: Plastic Surgery

## 2014-03-07 ENCOUNTER — Ambulatory Visit (HOSPITAL_COMMUNITY)
Admission: RE | Admit: 2014-03-07 | Discharge: 2014-03-07 | Disposition: A | Discharge status: Home / Self Care | Payer: 59 | Source: Ambulatory Visit | Attending: Internal Medicine | Admitting: Internal Medicine

## 2014-03-07 ENCOUNTER — Ambulatory Visit (HOSPITAL_BASED_OUTPATIENT_CLINIC_OR_DEPARTMENT_OTHER)
Admission: RE | Admit: 2014-03-07 | Discharge: 2014-03-07 | Disposition: A | Discharge status: Home / Self Care | Payer: 59 | Source: Ambulatory Visit | Attending: Internal Medicine | Admitting: Internal Medicine

## 2014-03-07 ENCOUNTER — Encounter (HOSPITAL_COMMUNITY): Payer: Self-pay

## 2014-03-07 VITALS — BP 126/72 | HR 77 | Wt 167.4 lb

## 2014-03-07 DIAGNOSIS — E039 Hypothyroidism, unspecified: Secondary | ICD-10-CM | POA: Insufficient documentation

## 2014-03-07 DIAGNOSIS — A6920 Lyme disease, unspecified: Secondary | ICD-10-CM | POA: Insufficient documentation

## 2014-03-07 DIAGNOSIS — J45909 Unspecified asthma, uncomplicated: Secondary | ICD-10-CM | POA: Insufficient documentation

## 2014-03-07 DIAGNOSIS — C50919 Malignant neoplasm of unspecified site of unspecified female breast: Secondary | ICD-10-CM | POA: Insufficient documentation

## 2014-03-07 DIAGNOSIS — C50419 Malignant neoplasm of upper-outer quadrant of unspecified female breast: Secondary | ICD-10-CM

## 2014-03-07 DIAGNOSIS — I519 Heart disease, unspecified: Secondary | ICD-10-CM

## 2014-03-07 NOTE — Progress Notes (Signed)
  Echocardiogram 2D Echocardiogram has been performed.  Aitan Rossbach, [place] 03/07/2014, 11:35 AM

## 2014-03-07 NOTE — Progress Notes (Signed)
Patient ID: Jane Gutierrez, female   DOB: 1956-12-03, 58 y.o.   MRN: [id number] PCP:  Dr Forde Dandy General Surgeon: Dr Donne Hazel Plastic Surgeon: Dr Eustace Quail is 58 year old [facility] liaison with a PMH of IBS, asthma, hypothroidism and triple-positive breast CA. ER +100% PR +86% HER-2/neu (Sentinel nodes were negative for metastatic disease).  She is s/p bilateral mastectomies with reconstruction.    Plan for adjuvant chemotherapy and Herceptin.  On 02/04/13 she started Taxotere/carboplatinum to be given every 3 weeks for a total of 6 cycles. Since then, she has been on Herceptin alone.  She completed Herceptin in 2/15.    VQ 05/27/13 Negative for PE  Echos: 01/26/13 EF 60% lateral S' 97.3 Grade II diastolic dysfunction 04/24/28 EF 60-65% lat s' 12.8  RV normal 05/27/13 EF lat S' 12.5 RV mildly dilated. Normal function. +RAE. IVC dilated 2.8 cm. Mod TR  RVSP 35-40.  06/04/13 TEE EF 55-60% mild-mod TR 09/09/13 EF 55-60% lateral s' 12.1 RV ok. Mild TR RVSP 25-30 12/06/13 EF 55-60% lateral s' 11.6, GS -21%, RV ok. 3/15 EF 55-60%, lateral s' 12, global longitudinal strain -19.8%, RV normal, mild RAE  Follow up:  She has been doing well recently.  No dyspnea or chest pain with exertion.  She finished Herceptin in February.   Review of Systems: All pertinent positives and negatives as in HPI, otherwise negative.   Past Medical History  Diagnosis Date  . IBS (irritable bowel syndrome)   . ADD (attention deficit disorder)   . Lyme disease   . Contact lens/glasses fitting     wears contacts or glasses  . Allergy   . Hypothyroidism   . Tricuspid regurgitation     "from the chemo" (09/29/2013)  . Exertional shortness of breath     "probably from the Cerpetiian I'm taking" (09/29/2013)  . Arthritis     "hands" (09/29/2013)  . Breast cancer 12/04/12    left, ER/PR +  . Cervical cancer     Current Outpatient Prescriptions  Medication Sig Dispense Refill  . albuterol (PROVENTIL HFA;VENTOLIN HFA) 108 (90 BASE)  MCG/ACT inhaler Inhale 2 puffs into the lungs every 6 (six) hours as needed for shortness of breath.  2 Inhaler  6  . aspirin 81 MG tablet Take 81 mg by mouth daily.      . B Complex-C (B-COMPLEX WITH VITAMIN C) tablet Take 1 tablet by mouth daily.      . diazepam (VALIUM) 2 MG tablet Take 1 tablet (2 mg total) by mouth every 12 (twelve) hours as needed.  30 tablet  0  . EPINEPHrine (EPIPEN) 0.3 mg/0.3 mL DEVI Inject 0.3 mg into the muscle as needed (for allergic reaction).       . eszopiclone (LUNESTA) 2 MG TABS tablet Take 1 tablet (2 mg total) by mouth at bedtime. Take immediately before bedtime  30 tablet  5  . exemestane (AROMASIN) 25 MG tablet Take 1 tablet (25 mg total) by mouth daily after breakfast.  30 tablet  12  . levothyroxine (SYNTHROID, LEVOTHROID) 100 MCG tablet Take 1 tablet (100 mcg total) by mouth daily.  30 tablet  6  . LORazepam (ATIVAN) 1 MG tablet Take 1 tablet (1 mg total) by mouth every 8 (eight) hours as needed for anxiety.  60 tablet  0  . Multiple Vitamin (MULTIVITAMIN WITH MINERALS) TABS Take 1 tablet by mouth daily.      . tamoxifen (NOLVADEX) 20 MG tablet Take 1 tablet (20 mg  total) by mouth daily.  30 tablet  12  . vitamin C (ASCORBIC ACID) 500 MG tablet Take 500 mg by mouth daily.      . vitamin E (VITAMIN E) 400 UNIT capsule Take 800 Units by mouth daily.       No current facility-administered medications for this encounter.     Allergies  Allergen Reactions  . Betadine [Povidone Iodine] Anaphylaxis  . Iodine Anaphylaxis    REACTION: anaphylactic  . Shellfish-Derived Products   . Sulfamethoxazole-Trimethoprim Other (See Comments)    Allergy from long ago unsure of reaction.   [name] Cleocin [Clindamycin Hcl] Rash  . Penicillins Rash    Filed Vitals:   03/07/14 1156  BP: 126/72  Pulse: 77  Weight: 167 lb 6.4 oz (75.932 kg)  SpO2: 98%    PHYSICAL EXAM: General:  Well appearing. No respiratory difficulty HEENT: normal Neck: supple.  JVP flat Carotids  2+ bilat; no bruits. No lymphadenopathy or thryomegaly appreciated. Cor: PMI nondisplaced. Regular rate & rhythm. No rubs, gallops  Lungs: clear Abdomen: soft, nontender, nondistended. No hepatosplenomegaly. No bruits or masses. Good bowel sounds. Extremities: no cyanosis, clubbing, rash, trace edema on RLE.  Neuro: alert & oriented x 3, cranial nerves grossly intact. moves all 4 extremities w/o difficulty. Affect pleasant.   ASSESSMENT & PLAN:  L Breast Cancer- S/P bilateral mastectomies.  She completed Herceptin in 2/15.  I reviewed her echo today.  LV EF and lateral s'/strain pattern were all stable compared to prior.  She will followup prn at this point.    Brayant Dorr,MD 03/07/2014

## 2014-03-09 ENCOUNTER — Encounter (HOSPITAL_BASED_OUTPATIENT_CLINIC_OR_DEPARTMENT_OTHER)
Admission: RE | Disposition: A | Discharge status: Home / Self Care | Payer: Self-pay | Source: Ambulatory Visit | Attending: Plastic Surgery

## 2014-03-09 ENCOUNTER — Ambulatory Visit (HOSPITAL_BASED_OUTPATIENT_CLINIC_OR_DEPARTMENT_OTHER): Payer: 59 | Admitting: Anesthesiology

## 2014-03-09 ENCOUNTER — Encounter (HOSPITAL_BASED_OUTPATIENT_CLINIC_OR_DEPARTMENT_OTHER): Payer: Self-pay | Admitting: Anesthesiology

## 2014-03-09 ENCOUNTER — Ambulatory Visit (HOSPITAL_BASED_OUTPATIENT_CLINIC_OR_DEPARTMENT_OTHER)
Admission: RE | Admit: 2014-03-09 | Discharge: 2014-03-09 | Disposition: A | Discharge status: Home / Self Care | Payer: 59 | Source: Ambulatory Visit | Attending: Plastic Surgery | Admitting: Plastic Surgery

## 2014-03-09 ENCOUNTER — Encounter (HOSPITAL_BASED_OUTPATIENT_CLINIC_OR_DEPARTMENT_OTHER): Payer: 59 | Admitting: Anesthesiology

## 2014-03-09 DIAGNOSIS — Z853 Personal history of malignant neoplasm of breast: Secondary | ICD-10-CM | POA: Insufficient documentation

## 2014-03-09 DIAGNOSIS — Z452 Encounter for adjustment and management of vascular access device: Secondary | ICD-10-CM | POA: Insufficient documentation

## 2014-03-09 DIAGNOSIS — E881 Lipodystrophy, not elsewhere classified: Secondary | ICD-10-CM | POA: Insufficient documentation

## 2014-03-09 DIAGNOSIS — Z9013 Acquired absence of bilateral breasts and nipples: Secondary | ICD-10-CM

## 2014-03-09 DIAGNOSIS — Z9010 Acquired absence of unspecified breast and nipple: Secondary | ICD-10-CM | POA: Insufficient documentation

## 2014-03-09 DIAGNOSIS — Z4430 Encounter for fitting and adjustment of external breast prosthesis, unspecified breast: Secondary | ICD-10-CM | POA: Insufficient documentation

## 2014-03-09 DIAGNOSIS — Z7982 Long term (current) use of aspirin: Secondary | ICD-10-CM | POA: Insufficient documentation

## 2014-03-09 DIAGNOSIS — K219 Gastro-esophageal reflux disease without esophagitis: Secondary | ICD-10-CM | POA: Insufficient documentation

## 2014-03-09 DIAGNOSIS — J45909 Unspecified asthma, uncomplicated: Secondary | ICD-10-CM | POA: Insufficient documentation

## 2014-03-09 HISTORY — PX: LIPOSUCTION: SHX10

## 2014-03-09 HISTORY — PX: PORT-A-CATH REMOVAL: SHX5289

## 2014-03-09 HISTORY — PX: REMOVAL OF BILATERAL TISSUE EXPANDERS WITH PLACEMENT OF BILATERAL BREAST IMPLANTS: SHX6431

## 2014-03-09 SURGERY — REMOVAL, TISSUE EXPANDER, BREAST, BILATERAL, WITH BILATERAL IMPLANT IMPLANT INSERTION
Anesthesia: General | Site: Neck | Laterality: Right

## 2014-03-09 MED ORDER — PANTOPRAZOLE SODIUM 40 MG IV SOLR: 40.0000 mg | Freq: Every day | INTRAVENOUS | Status: DC

## 2014-03-09 MED ORDER — BUPIVACAINE HCL (PF) 0.25 % IJ SOLN
INTRAMUSCULAR | Status: AC
  Filled 2014-03-09: qty 30

## 2014-03-09 MED ORDER — LIDOCAINE HCL (PF) 1 % IJ SOLN
INTRAMUSCULAR | Status: AC
  Filled 2014-03-09: qty 30

## 2014-03-09 MED ORDER — SUFENTANIL CITRATE 50 MCG/ML IV SOLN
INTRAVENOUS | Status: AC
  Filled 2014-03-09: qty 1

## 2014-03-09 MED ORDER — MIDAZOLAM HCL 2 MG/2ML IJ SOLN: 0.5000 mg | Freq: Once | INTRAMUSCULAR | Status: DC | PRN

## 2014-03-09 MED ORDER — MIDAZOLAM HCL 5 MG/5ML IJ SOLN
INTRAMUSCULAR | Status: DC | PRN
  Administered 2014-03-09: 2 mg via INTRAVENOUS

## 2014-03-09 MED ORDER — ONDANSETRON HCL 4 MG/2ML IJ SOLN
INTRAMUSCULAR | Status: DC | PRN
  Administered 2014-03-09: 4 mg via INTRAVENOUS

## 2014-03-09 MED ORDER — DEXAMETHASONE SODIUM PHOSPHATE 4 MG/ML IJ SOLN
INTRAMUSCULAR | Status: DC | PRN
  Administered 2014-03-09: 10 mg via INTRAVENOUS

## 2014-03-09 MED ORDER — LEVOTHYROXINE SODIUM 100 MCG PO TABS: 100.0000 ug | ORAL_TABLET | Freq: Every day | ORAL | Status: DC

## 2014-03-09 MED ORDER — DIAZEPAM 2 MG PO TABS: 2.0000 mg | ORAL_TABLET | Freq: Two times a day (BID) | ORAL | Status: DC | PRN

## 2014-03-09 MED ORDER — LIDOCAINE HCL 1 % IJ SOLN
INTRAMUSCULAR | Status: DC | PRN
  Administered 2014-03-09: 50 mL

## 2014-03-09 MED ORDER — FENTANYL CITRATE 0.05 MG/ML IJ SOLN: 50.0000 ug | INTRAMUSCULAR | Status: DC | PRN

## 2014-03-09 MED ORDER — MIDAZOLAM HCL 2 MG/2ML IJ SOLN
1.0000 mg | INTRAMUSCULAR | Status: DC | PRN
Start: 2014-03-09 — End: 2014-03-09

## 2014-03-09 MED ORDER — MIDAZOLAM HCL 2 MG/2ML IJ SOLN
INTRAMUSCULAR | Status: AC
  Filled 2014-03-09: qty 2

## 2014-03-09 MED ORDER — HYDROMORPHONE HCL PF 1 MG/ML IJ SOLN
0.2500 mg | INTRAMUSCULAR | Status: DC | PRN
  Administered 2014-03-09 (×3): 0.5 mg via INTRAVENOUS

## 2014-03-09 MED ORDER — EPINEPHRINE HCL 1 MG/ML IJ SOLN
INTRAMUSCULAR | Status: DC | PRN
  Administered 2014-03-09: 1 mg

## 2014-03-09 MED ORDER — SUCCINYLCHOLINE CHLORIDE 20 MG/ML IJ SOLN
INTRAMUSCULAR | Status: AC
  Filled 2014-03-09: qty 4

## 2014-03-09 MED ORDER — SUCCINYLCHOLINE CHLORIDE 20 MG/ML IJ SOLN
INTRAMUSCULAR | Status: DC | PRN
  Administered 2014-03-09: 100 mg via INTRAVENOUS

## 2014-03-09 MED ORDER — LIDOCAINE HCL 1 % IJ SOLN
INTRAVENOUS | Status: DC | PRN
  Administered 2014-03-09: 10:00:00

## 2014-03-09 MED ORDER — HYDROMORPHONE HCL PF 1 MG/ML IJ SOLN
INTRAMUSCULAR | Status: AC
  Filled 2014-03-09: qty 1

## 2014-03-09 MED ORDER — LIDOCAINE HCL (CARDIAC) 20 MG/ML IV SOLN
INTRAVENOUS | Status: DC | PRN
  Administered 2014-03-09: 50 mg via INTRAVENOUS

## 2014-03-09 MED ORDER — PROMETHAZINE HCL 25 MG/ML IJ SOLN: 6.2500 mg | INTRAMUSCULAR | Status: DC | PRN

## 2014-03-09 MED ORDER — SUFENTANIL CITRATE 50 MCG/ML IV SOLN
INTRAVENOUS | Status: DC | PRN
  Administered 2014-03-09: 5 ug via INTRAVENOUS
  Administered 2014-03-09: 10 ug via INTRAVENOUS
  Administered 2014-03-09: 5 ug via INTRAVENOUS

## 2014-03-09 MED ORDER — MEPERIDINE HCL 25 MG/ML IJ SOLN: 6.2500 mg | INTRAMUSCULAR | Status: DC | PRN

## 2014-03-09 MED ORDER — EPINEPHRINE HCL 1 MG/ML IJ SOLN
INTRAMUSCULAR | Status: AC
  Filled 2014-03-09: qty 1

## 2014-03-09 MED ORDER — ACETAMINOPHEN 325 MG PO TABS
650.0000 mg | ORAL_TABLET | Freq: Four times a day (QID) | ORAL | Status: DC | PRN
Start: 2014-03-09 — End: 2014-03-09

## 2014-03-09 MED ORDER — LIDOCAINE-EPINEPHRINE 1 %-1:100000 IJ SOLN
INTRAMUSCULAR | Status: AC
Start: 2014-03-09 — End: 2014-03-09
  Filled 2014-03-09: qty 1

## 2014-03-09 MED ORDER — CIPROFLOXACIN IN D5W 400 MG/200ML IV SOLN
INTRAVENOUS | Status: AC
  Filled 2014-03-09: qty 200

## 2014-03-09 MED ORDER — OXYCODONE HCL 5 MG PO TABS: 5.0000 mg | ORAL_TABLET | Freq: Once | ORAL | Status: DC | PRN

## 2014-03-09 MED ORDER — SODIUM CHLORIDE 0.9 % IR SOLN
Status: DC | PRN
  Administered 2014-03-09: 10:00:00

## 2014-03-09 MED ORDER — LACTATED RINGERS IV SOLN
INTRAVENOUS | Status: DC | PRN
  Administered 2014-03-09: 1000 mL via INTRAVENOUS

## 2014-03-09 MED ORDER — ONDANSETRON HCL 4 MG/2ML IJ SOLN: 4.0000 mg | Freq: Four times a day (QID) | INTRAMUSCULAR | Status: DC | PRN

## 2014-03-09 MED ORDER — LACTATED RINGERS IV SOLN
INTRAVENOUS | Status: DC
  Administered 2014-03-09: 10 mL/h via INTRAVENOUS
  Administered 2014-03-09 (×2): via INTRAVENOUS

## 2014-03-09 MED ORDER — OXYCODONE HCL 5 MG/5ML PO SOLN: 5.0000 mg | Freq: Once | ORAL | Status: DC | PRN

## 2014-03-09 MED ORDER — HYDROCODONE-ACETAMINOPHEN 5-325 MG PO TABS: 1.0000 | ORAL_TABLET | ORAL | Status: DC | PRN

## 2014-03-09 MED ORDER — PHENYLEPHRINE HCL 10 MG/ML IJ SOLN
INTRAMUSCULAR | Status: DC | PRN
  Administered 2014-03-09 (×2): 40 ug via INTRAVENOUS

## 2014-03-09 MED ORDER — CIPROFLOXACIN IN D5W 400 MG/200ML IV SOLN
400.0000 mg | INTRAVENOUS | Status: AC
  Administered 2014-03-09: 400 mg via INTRAVENOUS

## 2014-03-09 MED ORDER — HYDROMORPHONE HCL PF 1 MG/ML IJ SOLN: 1.0000 mg | INTRAMUSCULAR | Status: DC | PRN

## 2014-03-09 MED ORDER — ACETAMINOPHEN 650 MG RE SUPP: 650.0000 mg | Freq: Four times a day (QID) | RECTAL | Status: DC | PRN

## 2014-03-09 MED ORDER — PROPOFOL 10 MG/ML IV BOLUS
INTRAVENOUS | Status: DC | PRN
  Administered 2014-03-09: 150 mg via INTRAVENOUS

## 2014-03-09 MED ORDER — ALBUTEROL SULFATE HFA 108 (90 BASE) MCG/ACT IN AERS: 2.0000 | INHALATION_SPRAY | Freq: Four times a day (QID) | RESPIRATORY_TRACT | Status: DC | PRN

## 2014-03-09 MED ORDER — LIDOCAINE-EPINEPHRINE 1 %-1:100000 IJ SOLN
INTRAMUSCULAR | Status: DC | PRN
  Administered 2014-03-09: 7 mL

## 2014-03-09 SURGICAL SUPPLY — 73 items
BAG DECANTER FOR FLEXI CONT (MISCELLANEOUS) ×8 IMPLANT
BANDAGE ELASTIC 6 VELCRO ST LF (GAUZE/BANDAGES/DRESSINGS) ×4 IMPLANT
BINDER BREAST LRG (GAUZE/BANDAGES/DRESSINGS) ×4 IMPLANT
BINDER BREAST MEDIUM (GAUZE/BANDAGES/DRESSINGS) IMPLANT
BINDER BREAST XLRG (GAUZE/BANDAGES/DRESSINGS) ×4 IMPLANT
BINDER BREAST XXLRG (GAUZE/BANDAGES/DRESSINGS) IMPLANT
BIOPATCH RED 1 DISK 7.0 (GAUZE/BANDAGES/DRESSINGS) IMPLANT
BLADE HEX COATED 2.75 (ELECTRODE) ×4 IMPLANT
BLADE SURG 15 STRL LF DISP TIS (BLADE) ×12 IMPLANT
BLADE SURG 15 STRL SS (BLADE) ×4
BNDG GAUZE ELAST 4 BULKY (GAUZE/BANDAGES/DRESSINGS) ×12 IMPLANT
CANISTER LIPO FAT HARVEST (MISCELLANEOUS) ×4 IMPLANT
CANISTER SUCT 1200ML W/VALVE (MISCELLANEOUS) ×8 IMPLANT
CHLORAPREP W/TINT 26ML (MISCELLANEOUS) ×8 IMPLANT
CORDS BIPOLAR (ELECTRODE) IMPLANT
COVER MAYO STAND STRL (DRAPES) ×4 IMPLANT
COVER TABLE BACK 60X90 (DRAPES) ×4 IMPLANT
DECANTER SPIKE VIAL GLASS SM (MISCELLANEOUS) IMPLANT
DERMABOND ADVANCED (GAUZE/BANDAGES/DRESSINGS) ×2
DERMABOND ADVANCED .7 DNX12 (GAUZE/BANDAGES/DRESSINGS) ×6 IMPLANT
DRAIN CHANNEL 19F RND (DRAIN) IMPLANT
DRAPE LAPAROSCOPIC ABDOMINAL (DRAPES) ×4 IMPLANT
DRSG TEGADERM 2-3/8X2-3/4 SM (GAUZE/BANDAGES/DRESSINGS) IMPLANT
ELECT BLADE 4.0 EZ CLEAN MEGAD (MISCELLANEOUS) ×4
ELECT REM PT RETURN 9FT ADLT (ELECTROSURGICAL) ×4
ELECTRODE BLDE 4.0 EZ CLN MEGD (MISCELLANEOUS) ×3 IMPLANT
ELECTRODE REM PT RTRN 9FT ADLT (ELECTROSURGICAL) ×3 IMPLANT
EVACUATOR SILICONE 100CC (DRAIN) IMPLANT
FILTER LIPOSUCTION (MISCELLANEOUS) ×4 IMPLANT
GLOVE BIO SURGEON STRL SZ 6.5 (GLOVE) ×40 IMPLANT
GLOVE SURG SS PI 7.0 STRL IVOR (GLOVE) ×4 IMPLANT
GOWN STRL REUS W/ TWL LRG LVL3 (GOWN DISPOSABLE) ×12 IMPLANT
GOWN STRL REUS W/TWL LRG LVL3 (GOWN DISPOSABLE) ×4
IMPLANT BREAST GEL 345CC (Breast) ×4 IMPLANT
IMPLANT BREAST MP GEL 395CC (Breast) ×4 IMPLANT
IV NS 1000ML (IV SOLUTION)
IV NS 1000ML BAXH (IV SOLUTION) IMPLANT
IV NS 500ML (IV SOLUTION)
IV NS 500ML BAXH (IV SOLUTION) IMPLANT
KIT FILL SYSTEM UNIVERSAL (SET/KITS/TRAYS/PACK) ×4 IMPLANT
LINER CANISTER 1000CC FLEX (MISCELLANEOUS) ×4 IMPLANT
NDL SAFETY ECLIPSE 18X1.5 (NEEDLE) ×6 IMPLANT
NEEDLE HYPO 18GX1.5 SHARP (NEEDLE) ×2
NEEDLE HYPO 25X1 1.5 SAFETY (NEEDLE) IMPLANT
NEEDLE SPNL 18GX3.5 QUINCKE PK (NEEDLE) ×4 IMPLANT
NS IRRIG 1000ML POUR BTL (IV SOLUTION) ×4 IMPLANT
PACK BASIN DAY SURGERY FS (CUSTOM PROCEDURE TRAY) ×4 IMPLANT
PAD ABD 8X10 STRL (GAUZE/BANDAGES/DRESSINGS) ×8 IMPLANT
PAD ALCOHOL SWAB (MISCELLANEOUS) ×12 IMPLANT
PENCIL BUTTON HOLSTER BLD 10FT (ELECTRODE) ×4 IMPLANT
PIN SAFETY STERILE (MISCELLANEOUS) IMPLANT
SIZER BREAST GENERIC MENTOR (SIZER) ×8 IMPLANT
SIZER BREAST REUSE 345CC (SIZER) ×4 IMPLANT
SIZER BREAST REUSE 395CC (SIZER) ×4 IMPLANT
SLEEVE SCD COMPRESS KNEE MED (MISCELLANEOUS) ×4 IMPLANT
SPONGE GAUZE 4X4 12PLY (GAUZE/BANDAGES/DRESSINGS) ×4 IMPLANT
SPONGE GAUZE 4X4 12PLY STER LF (GAUZE/BANDAGES/DRESSINGS) IMPLANT
SPONGE LAP 18X18 X RAY DECT (DISPOSABLE) ×8 IMPLANT
SUT MNCRL AB 4-0 PS2 18 (SUTURE) ×8 IMPLANT
SUT MON AB 5-0 PS2 18 (SUTURE) ×16 IMPLANT
SUT PDS AB 2-0 CT2 27 (SUTURE) ×8 IMPLANT
SUT VIC AB 3-0 SH 27 (SUTURE) ×5
SUT VIC AB 3-0 SH 27X BRD (SUTURE) ×15 IMPLANT
SUT VICRYL 4-0 PS2 18IN ABS (SUTURE) ×8 IMPLANT
SYR 50ML LL SCALE MARK (SYRINGE) ×8 IMPLANT
SYR BULB IRRIGATION 50ML (SYRINGE) ×4 IMPLANT
SYR CONTROL 10ML LL (SYRINGE) IMPLANT
SYR TB 1ML LL NO SAFETY (SYRINGE) ×4 IMPLANT
TOWEL OR 17X24 6PK STRL BLUE (TOWEL DISPOSABLE) ×8 IMPLANT
TUBE CONNECTING 20X1/4 (TUBING) ×4 IMPLANT
TUBING SET GRADUATE ASPIR 12FT (MISCELLANEOUS) ×8 IMPLANT
UNDERPAD 30X30 INCONTINENT (UNDERPADS AND DIAPERS) ×8 IMPLANT
YANKAUER SUCT BULB TIP NO VENT (SUCTIONS) ×4 IMPLANT

## 2014-03-09 NOTE — H&P (Signed)
Allyson Alexandria Lodge is an 58 y.o. female.   Chief Complaint: bilateral acquired absence of breast HPI: The patient is a 58 yrs old wf here for breast reconstruction.  She underwent bilateral mastectomies with reconstruction (latis on the left and expander on the right).  She presents for secondary surgery with removal of expanders and placement of implants.  Removal of port as well will be done.  Past Medical History  Diagnosis Date  . IBS (irritable bowel syndrome)   . ADD (attention deficit disorder)   . Lyme disease   . Contact lens/glasses fitting     wears contacts or glasses  . Allergy   . Hypothyroidism   . Tricuspid regurgitation     "from the chemo" (09/29/2013)  . Exertional shortness of breath     "probably from the Cerpetiian I'm taking" (09/29/2013)  . Arthritis     "hands" (09/29/2013)  . Breast cancer 12/04/12    left, ER/PR +  . Cervical cancer     Past Surgical History  Procedure Laterality Date  . Brain surgery      removal benign frontal lobe cyst in 1987  . Foot surgery Bilateral 2013    "toes were webbed together; had them separated" (09/29/2013)  . Gynecologic cryosurgery      cervical cancer, 1998 last remained disease free with recent normal pap  . Diagnostic laparoscopy  2004    lysis adh-uterine ablation  . Breast enhancement surgery Bilateral ~ 2008  . Breast surgery Left     removal benign left breast lesion  . Facial cosmetic surgery  ~ 2010  . Axillary sentinel node biopsy  12/31/2012    Procedure: AXILLARY SENTINEL NODE BIOPSY;  Surgeon: Rolm Bookbinder, MD;  Location: [facility];  Service: General;  Laterality: Bilateral;  bilateral sentinel node  . Total mastectomy  12/31/2012    Procedure: TOTAL MASTECTOMY;  Surgeon: Rolm Bookbinder, MD;  Location: [facility];  Service: General;  Laterality: Bilateral;  bilateral total mastectomies  . Breast reconstruction with placement of tissue expander and flex hd (acellular hydrated  dermis)  12/31/2012    Procedure: BREAST RECONSTRUCTION WITH PLACEMENT OF TISSUE EXPANDER AND FLEX HD (ACELLULAR HYDRATED DERMIS);  Surgeon: Theodoro Kos, DO;  Location: [facility];  Service: Plastics;  Laterality: Bilateral;  bilateral immediate breast reconstruction with expanders and flex hd   . Portacath placement  01/27/2013    Procedure: INSERTION PORT-A-CATH;  Surgeon: Rolm Bookbinder, MD;  Location: [facility];  Service: General;  Laterality: Right;  . Incision and drainage of wound Left 04/28/2013    Procedure: IRRIGATION AND DEBRIDEMENT LEFT BREAST WOUND, POSSIBLE CLOSURE OF WOUND WITH DRAIN PLACEMENT;  Surgeon: Theodoro Kos, DO;  Location: [facility];  Service: Plastics;  Laterality: Left;  . Tee without cardioversion N/A 06/04/2013    Procedure: TRANSESOPHAGEAL ECHOCARDIOGRAM (TEE);  Surgeon: Jolaine Artist, MD;  Location: [facility] ENDOSCOPY;  Service: Cardiovascular;  Laterality: N/A;  . Tissue expander placement Left 06/16/2013    Procedure: LEFT BREAST TISSUE EXPANDER REMOVAL;  Surgeon: Theodoro Kos, DO;  Location: [facility];  Service: Plastics;  Laterality: Left;  . Reconstruction breast w/ latissimus dorsi flap Left 09/29/2013  . Tissue expander placement Left 09/29/2013  . Abdominal hysterectomy  2005    TAH/BSO-lysis adh, enometriosis  . Dilation and curettage of uterus  2005  . Cervical cerclage    . Latissimus flap to breast Left 09/29/2013    Procedure: LATISSIMUS  FLAP TO BREAST WITH TISSUE EXPANDER PLACEMENT FOR LEFT BREAST RECONSTRUCTION;  Surgeon: Theodoro Kos, DO;  Location: [place];  Service: Plastics;  Laterality: Left;  . Tissue expander placement Left 09/29/2013    Procedure: TISSUE EXPANDER;  Surgeon: Theodoro Kos, DO;  Location: [facility] [facility];  Service: Plastics;  Laterality: Left;    Family History  Problem Relation Age of Onset  . Cancer Mother 20    uterine, mult myeloma  . CVA Father   . Cancer Sister 45     breast  . Cancer Maternal Grandmother 27    breast   Social History:  reports that she has never smoked. She has never used smokeless tobacco. She reports that she does not drink alcohol or use illicit drugs.  Allergies:  Allergies  Allergen Reactions  . Betadine [Povidone Iodine] Anaphylaxis  . Iodine Anaphylaxis    REACTION: anaphylactic  . Shellfish-Derived Products   . Sulfamethoxazole-Trimethoprim Other (See Comments)    Allergy from long ago unsure of reaction.   [name] Cleocin [Clindamycin Hcl] Rash  . Penicillins Rash    Medications Prior to Admission  Medication Sig Dispense Refill  . albuterol (PROVENTIL HFA;VENTOLIN HFA) 108 (90 BASE) MCG/ACT inhaler Inhale 2 puffs into the lungs every 6 (six) hours as needed for shortness of breath.  2 Inhaler  6  . aspirin 81 MG tablet Take 81 mg by mouth daily.      . B Complex-C (B-COMPLEX WITH VITAMIN C) tablet Take 1 tablet by mouth daily.      . diazepam (VALIUM) 2 MG tablet Take 1 tablet (2 mg total) by mouth every 12 (twelve) hours as needed.  30 tablet  0  . eszopiclone (LUNESTA) 2 MG TABS tablet Take 1 tablet (2 mg total) by mouth at bedtime. Take immediately before bedtime  30 tablet  5  . levothyroxine (SYNTHROID, LEVOTHROID) 100 MCG tablet Take 1 tablet (100 mcg total) by mouth daily.  30 tablet  6  . Multiple Vitamin (MULTIVITAMIN WITH MINERALS) TABS Take 1 tablet by mouth daily.      . vitamin C (ASCORBIC ACID) 500 MG tablet Take 500 mg by mouth daily.      . vitamin E (VITAMIN E) 400 UNIT capsule Take 800 Units by mouth daily.      [name] EPINEPHrine (EPIPEN) 0.3 mg/0.3 mL DEVI Inject 0.3 mg into the muscle as needed (for allergic reaction).       [name] exemestane (AROMASIN) 25 MG tablet Take 1 tablet (25 mg total) by mouth daily after breakfast.  30 tablet  12  . LORazepam (ATIVAN) 1 MG tablet Take 1 tablet (1 mg total) by mouth every 8 (eight) hours as needed for anxiety.  60 tablet  0  . tamoxifen (NOLVADEX) 20 MG tablet Take 1  tablet (20 mg total) by mouth daily.  30 tablet  12    No results found for this or any previous visit (from the past 48 hour(s)). No results found.  Review of Systems  Constitutional: Negative.   HENT: Negative.   Eyes: Negative.   Respiratory: Negative.   Cardiovascular: Negative.   Gastrointestinal: Negative.   Genitourinary: Negative.   Musculoskeletal: Negative.   Skin: Negative.   Neurological: Negative.   Psychiatric/Behavioral: Negative.     Height 5\' 4"  (1.626 m), weight 74.844 kg (165 lb). Physical Exam  Constitutional: She appears well-developed and well-nourished.  HENT:  Head: Normocephalic and atraumatic.  Eyes: Conjunctivae and EOM are normal. Pupils are equal, round, and  reactive to light.  Cardiovascular: Normal rate.   Respiratory: Effort normal.  GI: Soft.  Musculoskeletal: Normal range of motion.  Neurological: She is alert.  Skin: Skin is warm.  Psychiatric: She has a normal mood and affect. Her behavior is normal. Judgment and thought content normal.     Assessment/Plan Bilateral breast expander removal with implant placement and removal of port and liposuction of chin and lipofilling of breasts.  SANGER,CLAIRE 03/09/2014, 8:53 AM

## 2014-03-09 NOTE — Transfer of Care (Signed)
Immediate Anesthesia Transfer of Care Note  Patient: Jane Gutierrez  Procedure(s) Performed: Procedure(s): REMOVAL OF BILATERAL TISSUE EXPANDERS WITH PLACEMENT OF BILATERAL BREAST IMPLANTS (Bilateral) LIPOSUCTION ABDOMEN W/ LIPOFILLING LATERAL BILATERAL BREAST (Bilateral) REMOVAL PORT-A-CATH (Right)  Patient Location: PACU  Anesthesia Type:General  Level of Consciousness: awake and alert   Airway & Oxygen Therapy: Patient Spontanous Breathing and Patient connected to face mask oxygen  Post-op Assessment: Report given to PACU RN and Post -op Vital signs reviewed and stable  Post vital signs: Reviewed and stable  Complications: No apparent anesthesia complications

## 2014-03-09 NOTE — Anesthesia Preprocedure Evaluation (Addendum)
Anesthesia Evaluation  Patient identified by MRN, date of birth, ID band Patient awake    Reviewed: Allergy & Precautions, H&P , NPO status , Patient's Chart, lab work & pertinent test results  History of Anesthesia Complications Negative for: history of anesthetic complications  Airway Mallampati: I TM Distance: >3 FB Neck ROM: Full    Dental  (+) Caps, Dental Advisory Given   Pulmonary shortness of breath, asthma ,  breath sounds clear to auscultation        Cardiovascular negative cardio ROS  Rhythm:Regular Rate:Normal  03/07/14 ECHO: normal LVF, EF 55-60%, valves OK   Neuro/Psych ADDnegative neurological ROS     GI/Hepatic Neg liver ROS, GERD-  Poorly Controlled,  Endo/Other  Hypothyroidism   Renal/GU negative Renal ROS     Musculoskeletal   Abdominal   Peds  Hematology  (+) anemia , chemo   Anesthesia Other Findings Breast cancer: surgery, chemo  Reproductive/Obstetrics                         Anesthesia Physical Anesthesia Plan  ASA: III  Anesthesia Plan: General   Post-op Pain Management:    Induction: Intravenous  Airway Management Planned: Oral ETT  Additional Equipment:   Intra-op Plan:   Post-operative Plan: Extubation in OR  Informed Consent: I have reviewed the patients History and Physical, chart, labs and discussed the procedure including the risks, benefits and alternatives for the proposed anesthesia with the patient or authorized representative who has indicated his/her understanding and acceptance.   Dental advisory given  Plan Discussed with: CRNA and Surgeon  Anesthesia Plan Comments: (Plan routine monitors, GETA)        Anesthesia Quick Evaluation

## 2014-03-09 NOTE — Brief Op Note (Signed)
03/09/2014  12:12 PM  PATIENT:  [place]  58 y.o. female  PRE-OPERATIVE DIAGNOSIS:  HX OF BREAST CANCER   POST-OPERATIVE DIAGNOSIS:  HX OF BREAST CANCER  PROCEDURE:  Procedure(s): REMOVAL OF BILATERAL TISSUE EXPANDERS WITH PLACEMENT OF BILATERAL BREAST IMPLANTS (Bilateral) LIPOSUCTION LATERAL BILATERAL BREAST (Bilateral) REMOVAL PORT-A-CATH (Right)  SURGEON:  Surgeon(s) and Role:    * Suhey Radford Sanger, DO - Primary  PHYSICIAN ASSISTANT: Shawn Rayburn, PA  ASSISTANTS: none   ANESTHESIA:   local and general  EBL:  Total I/O In: 1000 [I.V.:1000] Out: -   BLOOD ADMINISTERED:none  DRAINS: none   LOCAL MEDICATIONS USED:  MARCAINE    and LIDOCAINE   SPECIMEN:  Source of Specimen:  left capsule and right mastectomy scar  DISPOSITION OF SPECIMEN:  PATHOLOGY  COUNTS:  YES  TOURNIQUET:  * No tourniquets in log *  DICTATION: .Dragon Dictation  PLAN OF CARE: Admit for overnight observation  PATIENT DISPOSITION:  PACU - hemodynamically stable.   Delay start of Pharmacological VTE agent (>24hrs) due to surgical blood loss or risk of bleeding: no

## 2014-03-09 NOTE — Anesthesia Procedure Notes (Signed)
Procedure Name: Intubation Date/Time: 03/09/2014 9:23 AM Performed by: Melynda Ripple D Pre-anesthesia Checklist: Patient identified, Emergency Drugs available, Suction available and Patient being monitored Patient Re-evaluated:Patient Re-evaluated prior to inductionOxygen Delivery Method: Circle System Utilized Preoxygenation: Pre-oxygenation with 100% oxygen Intubation Type: IV induction Ventilation: Mask ventilation without difficulty Laryngoscope Size: Mac and 3 Grade View: Grade I Tube type: Oral Number of attempts: 1 Airway Equipment and Method: stylet and oral airway Placement Confirmation: ETT inserted through vocal cords under direct vision,  positive ETCO2 and breath sounds checked- equal and bilateral Secured at: 22 (R Lip) cm Tube secured with: Tape Dental Injury: Teeth and Oropharynx as per pre-operative assessment

## 2014-03-09 NOTE — Discharge Instructions (Signed)
° °  Follow up with Dr Migdalia Dk in the office as scheduled, either Tues Wed or Fri. Keep Breast Binder on or Form fitting sports bra, without wire. Can shower lower extremities tomorrow.   Remove Neck wrap and ace on Friday and shower. Reapply ace wrap to decrease swelling and bruising after showering.    Post Anesthesia Home Care Instructions  Activity: Get plenty of rest for the remainder of the day. A responsible adult should stay with you for 24 hours following the procedure.  For the next 24 hours, DO NOT: -Drive a car -Paediatric nurse -Drink alcoholic beverages -Take any medication unless instructed by your physician -Make any legal decisions or sign important papers.  Meals: Start with liquid foods such as gelatin or soup. Progress to regular foods as tolerated. Avoid greasy, spicy, heavy foods. If nausea and/or vomiting occur, drink only clear liquids until the nausea and/or vomiting subsides. Call your physician if vomiting continues.  Special Instructions/Symptoms: Your throat may feel dry or sore from the anesthesia or the breathing tube placed in your throat during surgery. If this causes discomfort, gargle with warm salt water. The discomfort should disappear within 24 hours.

## 2014-03-10 ENCOUNTER — Encounter (HOSPITAL_BASED_OUTPATIENT_CLINIC_OR_DEPARTMENT_OTHER): Payer: Self-pay | Admitting: Plastic Surgery

## 2014-03-10 NOTE — Anesthesia Postprocedure Evaluation (Signed)
  Anesthesia Post-op Note  Patient: [employer]  Procedure(s) Performed: Procedure(s) with comments: REMOVAL OF BILATERAL TISSUE EXPANDERS WITH PLACEMENT OF BILATERAL BREAST IMPLANTS (Bilateral) LIPOSUCTION ABDOMEN W/ LIPOFILLING LATERAL BILATERAL BREAST (Bilateral) - LIPOSUCTION NECK REMOVAL PORT-A-CATH (Right)  Patient Location: PACU  Anesthesia Type:General  Level of Consciousness: awake, alert , oriented and patient cooperative  Airway and Oxygen Therapy: Patient Spontanous Breathing and Patient connected to nasal cannula oxygen  Post-op Pain: mild  Post-op Assessment: Post-op Vital signs reviewed, Patient's Cardiovascular Status Stable, Respiratory Function Stable, Patent Airway, No signs of Nausea or vomiting and Pain level controlled  Post-op Vital Signs: Reviewed and stable  Complications: No apparent anesthesia complications

## 2014-03-10 NOTE — Transfer of Care (Deleted)
Immediate Anesthesia Transfer of Care Note  Patient: Jane Gutierrez  Procedure(s) Performed: Procedure(s) with comments: REMOVAL OF BILATERAL TISSUE EXPANDERS WITH PLACEMENT OF BILATERAL BREAST IMPLANTS (Bilateral) LIPOSUCTION ABDOMEN W/ LIPOFILLING LATERAL BILATERAL BREAST (Bilateral) - LIPOSUCTION NECK REMOVAL PORT-A-CATH (Right)  Patient Location: PACU  Anesthesia Type:General  Level of Consciousness: awake, alert  and oriented  Airway & Oxygen Therapy: Patient Spontanous Breathing and Patient connected to face mask oxygen  Post-op Assessment: Report given to PACU RN and Post -op Vital signs reviewed and stable  Post vital signs: Reviewed and stable  Complications: No apparent anesthesia complications

## 2014-03-11 NOTE — Op Note (Addendum)
Operative Note   DATE OF OPERATION: 03/11/2014  LOCATION: [facility]  SURGICAL DIVISION: Plastic Surgery  PREOPERATIVE DIAGNOSES:  Neck lipodystrophy  POSTOPERATIVE DIAGNOSES:  same  PROCEDURE:  Liposuction of neck  SURGEON: Claire Sanger, DO  ASSISTANT: none  ANESTHESIA:  General.   COMPLICATIONS: None.   INDICATIONS FOR PROCEDURE:  The patient, Jane Gutierrez, is a 58 y.o. female born on 08/25/1956, is here for treatment of lipodystrophy of her neck.   CONSENT:  Informed consent was obtained directly from the patient. Risks, benefits and alternatives were fully discussed. Specific risks including but not limited to bleeding, infection, hematoma, seroma, scarring, pain, infection, asymmetry, wound healing problems, and need for further surgery were all discussed. The patient did have an ample opportunity to have questions answered to satisfaction.   DESCRIPTION OF PROCEDURE:  The patient was taken to the operating room. SCDs were placed and IV antibiotics were given. The patient's operative site was prepped and draped in a sterile fashion. A time out was performed and all information was confirmed to be correct.  General anesthesia was administered.  Tumescent was used to infuse into the neck area.  After waiting several minutes for the epinepherine to take effect the canula was placed and liposuction was done to remove the excess adipose.  Care was taken for symmetry and contour. The incision was closed with 6-0 Monocryl. The patient tolerated the procedure well.  There were no complications. The patient was allowed to wake from anesthesia, extubated and taken to the recovery room in satisfactory condition.

## 2014-03-11 NOTE — Op Note (Addendum)
Op report Bilateral Exchange   DATE OF OPERATION: 03/11/2014  LOCATION: [facility]  SURGICAL DIVISION: Plastic Surgery  PREOPERATIVE DIAGNOSES:  1. History of breast cancer.  2. Acquired absence of bilateral breast.  3. Port-a-cath in place.  POSTOPERATIVE DIAGNOSES:  1. History of breast cancer.  2. Acquired absence of bilateral breast.  3. Port-a-cath in place.  PROCEDURE:  1. Bilateral exchange of tissue expanders for implants.  2. Bilateral capsulotomies for implant respositioning. 3. Removal of port-a-cath. 4. Liposuction of the lateral breasts for symmetry.  SURGEON: Theodoro Kos, DO  ASSISTANT: Shawn Rayburn, PA  ANESTHESIA:  General.   COMPLICATIONS: None.   IMPLANTS: Left - [employer]or Memory Shape Breast Implant 345cc. Ref #[id number].  Serial Number [id number] Right - [employer]or Memory Shape Breast Implant 395cc. Ref #[id number].  Serial Number [id number]  INDICATIONS FOR PROCEDURE:  The patient, Jane Gutierrez, is a 58 y.o. female born on [birth year], is here for treatment after bilateral mastectomies.  She had tissue expanders placed at the time of mastectomies. She now presents for exchange of her expanders for implants.  She requires capsulotomies to better position the implants.   CONSENT:  Informed consent was obtained directly from the patient. Risks, benefits and alternatives were fully discussed. Specific risks including but not limited to bleeding, infection, hematoma, seroma, scarring, pain, implant infection, implant extrusion, capsular contracture, asymmetry, wound healing problems, and need for further surgery were all discussed. The patient did have an ample opportunity to have her questions answered to her satisfaction.   DESCRIPTION OF PROCEDURE:  The patient was taken to the operating room. SCDs were placed and IV antibiotics were given. The patient's chest was prepped and draped in a sterile fashion. A time out was performed and the implants  to be used were identified.  One percent Lidocaine with epinephrine was used to infiltrate the area.   On the right, the old mastectomy scar was opened and superior mastectomy and inferior mastectomy flaps were re-raised over the pectoralis major muscle. The pectoralis was split to expose the tissue expander which was removed. Inspection of the pocket showed a normal healthy capsule and good integration of the biologic matrix. On the left, the inferior latissimus incision was incised and the bovie used to dissect down to the expander.  The expander was removed.  The pocket was irrigated with antibiotic solution.  Circumferential capsulotomies were performed on each breast to allow for breast pocket expansion on either side.  Measurements were made on either side to confirm adequate pocket size for the implant dimensions.  Hemostasis was ensured.  The gloves were changed. The implants were placed in the pockets and oriented appropriately. On the right, pectoralis major muscle and  capsule on the anterior surface was re-closed with a 3-0 running Vicryl suture. The remaining skin was closed with 4-0 Monocryl deep dermal and 5-0 Monocryl subcuticular stitches.  On the left, the latissimus and capsule were closed with 3-0 Vicryl followed by 4-0 Monocryl and 5-0 Monocryl.   Tumescent was used to infuse into the lateral breast area.  After waiting several minutes for the epinepherine to take effect liposuction was performed for symmetry and contour.  The port-a-cath was removed.  An incision was made with a #15 blade.  The bovie was used to dissect to the port and a 4-0 Vicryl was placed around the site of the tubing.  The tube was removed and the suture pulled tight.  The capsule was removed and the area was bovied.  The deep layers were closed with 4-0 Vicryl followed by 5-0 Monocryl.  Dermabond was applied. A breast binder and ABDs were placed.  The patient was allowed to wake from anesthesia and taken to the  recovery room in satisfactory condition.

## 2014-03-16 DIAGNOSIS — Z9889 Other specified postprocedural states: Secondary | ICD-10-CM | POA: Insufficient documentation

## 2014-04-04 ENCOUNTER — Other Ambulatory Visit (HOSPITAL_COMMUNITY): Payer: Self-pay | Admitting: "Endocrinology

## 2014-04-04 DIAGNOSIS — E049 Nontoxic goiter, unspecified: Secondary | ICD-10-CM

## 2014-04-04 DIAGNOSIS — E278 Other specified disorders of adrenal gland: Secondary | ICD-10-CM

## 2014-04-04 DIAGNOSIS — E279 Disorder of adrenal gland, unspecified: Principal | ICD-10-CM

## 2014-04-11 ENCOUNTER — Ambulatory Visit (HOSPITAL_COMMUNITY)
Admission: RE | Admit: 2014-04-11 | Discharge: 2014-04-11 | Disposition: A | Discharge status: Home / Self Care | Payer: 59 | Source: Ambulatory Visit | Attending: "Endocrinology | Admitting: "Endocrinology

## 2014-04-11 ENCOUNTER — Encounter (HOSPITAL_COMMUNITY): Payer: Self-pay

## 2014-04-11 ENCOUNTER — Other Ambulatory Visit (HOSPITAL_COMMUNITY): Payer: 59

## 2014-04-11 DIAGNOSIS — E278 Other specified disorders of adrenal gland: Secondary | ICD-10-CM

## 2014-04-11 DIAGNOSIS — E279 Disorder of adrenal gland, unspecified: Secondary | ICD-10-CM

## 2014-04-11 DIAGNOSIS — E042 Nontoxic multinodular goiter: Secondary | ICD-10-CM | POA: Insufficient documentation

## 2014-04-11 DIAGNOSIS — E049 Nontoxic goiter, unspecified: Secondary | ICD-10-CM | POA: Insufficient documentation

## 2014-04-19 ENCOUNTER — Other Ambulatory Visit: Payer: Self-pay | Admitting: "Endocrinology

## 2014-04-19 DIAGNOSIS — E041 Nontoxic single thyroid nodule: Secondary | ICD-10-CM

## 2014-04-21 NOTE — Progress Notes (Signed)
Report rcvd from MD Nida dtd 04/20/14 - test results and clinical note.  Copy to [facility].  Original sent to scan.

## 2014-05-26 ENCOUNTER — Encounter: Payer: Self-pay | Admitting: Adult Health

## 2014-05-26 ENCOUNTER — Ambulatory Visit (HOSPITAL_BASED_OUTPATIENT_CLINIC_OR_DEPARTMENT_OTHER): Payer: 59 | Admitting: Adult Health

## 2014-05-26 ENCOUNTER — Other Ambulatory Visit (HOSPITAL_BASED_OUTPATIENT_CLINIC_OR_DEPARTMENT_OTHER): Payer: 59

## 2014-05-26 VITALS — BP 126/80 | HR 84 | Temp 97.8°F | Resp 18 | Ht 64.0 in | Wt 164.9 lb

## 2014-05-26 DIAGNOSIS — Z170 Estrogen receptor positive status [ER+]: Secondary | ICD-10-CM

## 2014-05-26 DIAGNOSIS — E041 Nontoxic single thyroid nodule: Secondary | ICD-10-CM

## 2014-05-26 DIAGNOSIS — C50419 Malignant neoplasm of upper-outer quadrant of unspecified female breast: Secondary | ICD-10-CM

## 2014-05-26 DIAGNOSIS — IMO0001 Reserved for inherently not codable concepts without codable children: Secondary | ICD-10-CM

## 2014-05-26 DIAGNOSIS — M2550 Pain in unspecified joint: Secondary | ICD-10-CM

## 2014-05-26 DIAGNOSIS — C50919 Malignant neoplasm of unspecified site of unspecified female breast: Secondary | ICD-10-CM

## 2014-05-26 LAB — COMPREHENSIVE METABOLIC PANEL (CC13)
ALK PHOS: 57 U/L (ref 40–150)
ALT: 25 U/L (ref 0–55)
AST: 27 U/L (ref 5–34)
Albumin: 3.5 g/dL (ref 3.5–5.0)
Anion Gap: 12 mEq/L — ABNORMAL HIGH (ref 3–11)
BUN: 26.4 mg/dL — AB (ref 7.0–26.0)
CALCIUM: 9.1 mg/dL (ref 8.4–10.4)
CO2: 24 mEq/L (ref 22–29)
Chloride: 106 mEq/L (ref 98–109)
Creatinine: 0.7 mg/dL (ref 0.6–1.1)
GLUCOSE: 92 mg/dL (ref 70–140)
Potassium: 4.1 mEq/L (ref 3.5–5.1)
Sodium: 142 mEq/L (ref 136–145)
Total Bilirubin: 0.38 mg/dL (ref 0.20–1.20)
Total Protein: 6.8 g/dL (ref 6.4–8.3)

## 2014-05-26 LAB — CBC WITH DIFFERENTIAL/PLATELET
BASO%: 0.6 % (ref 0.0–2.0)
BASOS ABS: 0 10*3/uL (ref 0.0–0.1)
EOS%: 1.4 % (ref 0.0–7.0)
Eosinophils Absolute: 0.1 10*3/uL (ref 0.0–0.5)
HEMATOCRIT: 43.6 % (ref 34.8–46.6)
HEMOGLOBIN: 14.3 g/dL (ref 11.6–15.9)
LYMPH%: 37.1 % (ref 14.0–49.7)
MCH: 27.9 pg (ref 25.1–34.0)
MCHC: 32.9 g/dL (ref 31.5–36.0)
MCV: 84.8 fL (ref 79.5–101.0)
MONO#: 0.5 10*3/uL (ref 0.1–0.9)
MONO%: 9 % (ref 0.0–14.0)
NEUT%: 51.9 % (ref 38.4–76.8)
NEUTROS ABS: 2.9 10*3/uL (ref 1.5–6.5)
Platelets: 220 10*3/uL (ref 145–400)
RBC: 5.14 10*6/uL (ref 3.70–5.45)
RDW: 14.6 % — AB (ref 11.2–14.5)
WBC: 5.5 10*3/uL (ref 3.9–10.3)
lymph#: 2 10*3/uL (ref 0.9–3.3)

## 2014-05-26 MED ORDER — ESZOPICLONE 3 MG PO TABS: 3.0000 mg | ORAL_TABLET | Freq: Every day | ORAL | Status: DC

## 2014-05-26 MED ORDER — ANASTROZOLE 1 MG PO TABS: 1.0000 mg | ORAL_TABLET | Freq: Every day | ORAL | Status: DC

## 2014-05-26 NOTE — Progress Notes (Signed)
[place]  [phone] [phone]  OFFICE PROGRESS NOTE   CC: Jane Stack, MD [address] [place] [address] Dr. Rolm Bookbinder Dr. Arloa Koh Dr. Mickel Duhamel Dr. Kyung Rudd   DIAGNOSIS: 58 year old female who presented on 12/24/2012 which new diagnosis of invasive ductal carcinoma, ER positive, PR positive, HER-2/neu positive in the left breast.     PRIOR THERAPY: #1  The patient has multiple medical problems but recently she had a mammogram performed that showed a 5.4 cm area of calcifications in the left breast.  MRI of the breasts showed the area in her left breast in the upper outer quadrant extended to about 7.2 cm. Needle core biopsy of the left breast showed a high-grade DCIS that was ER positive PR positive.  #2  She was seen by Dr. Rolm Bookbinder regarding surgical options. The patient wanted to have bilateral mastectomies.  She is now status post bilateral mastectomy on 12/31/2012. The final pathology did reveal a 2.3 cm invasive ductal carcinoma in the left breast. The tumor was ER +100%, PR +86%,  HER-2/neu was amplified with a ratio of 6.81, Ki-67 was elevated at 46%. She is in the process of completing reconstruction.  #3 The patient was seen again in medical oncology for discussion of adjuvant treatment. Since her tumor was HER-2/neu positive, we recommended anti-HER-2/neu- based therapy consisting of Taxotere/Carboplatinum/Herceptin. The Taxotere and Carboplatinum was given every 3 weeks for 5 of 6 cycles. She received Neulasta on day 2. Herceptin was given weekly during the duration of chemotherapy and then every 3 weeks to finish out a one-year of treatment. She received systemic therapy with Luana from 02/04/2013 through 05/13/2013.  #4  The patient will continue Herceptin every 3 weeks to finish out 1 year.  #5  She began antiestrogen therapy with Letrozole 2.5 mg daily starting 06/03/2013, however patient began  having aches and pains. We discontinued. Patient was then prescribed aromasin 25 mg daily, however she hasn't started taking it.   CURRENT THERAPY:  Not taking anti-estrogen therapy  INTERVAL HISTORY:   [name] is here for follow up of her h/o breast cancer.  She is doing moderately well today.  She had started taking the Tamoxifen and had increased depression, therefore she stopped it.  She did have an ultrasound of the thyroid that demonstrated enlarged nodule and lymph node.  They were too small to biopsy.  She would like a repeat ultrasound in October.  She denies fevers, chills, nausea, vomiting, constipation, diarrhea, or any further cocnerns.     MEDICAL HISTORY: Past Medical History  Diagnosis Date  . IBS (irritable bowel syndrome)   . ADD (attention deficit disorder)   . Lyme disease   . Contact lens/glasses fitting     wears contacts or glasses  . Allergy   . Hypothyroidism   . Tricuspid regurgitation     "from the chemo" (09/29/2013)  . Exertional shortness of breath     "probably from the Cerpetiian I'm taking" (09/29/2013)  . Arthritis     "hands" (09/29/2013)  . Breast cancer 12/04/12    left, ER/PR +  . Cervical cancer     ALLERGIES:  is allergic to betadine; iodine; shellfish-derived products; sulfamethoxazole-trimethoprim; cleocin; and penicillins.  MEDICATIONS:  Current Outpatient Prescriptions  Medication Sig Dispense Refill  . albuterol (PROVENTIL HFA;VENTOLIN HFA) 108 (90 BASE) MCG/ACT inhaler Inhale 2 puffs into the lungs every 6 (six) hours as needed for shortness of breath.  2 Inhaler  6  . aspirin 81 MG tablet Take 81 mg by mouth daily.      . B Complex-C (B-COMPLEX WITH VITAMIN C) tablet Take 1 tablet by mouth daily.      . diazepam (VALIUM) 2 MG tablet Take 1 tablet (2 mg total) by mouth every 12 (twelve) hours as needed.  30 tablet  0  . EPINEPHrine (EPIPEN) 0.3 mg/0.3 mL DEVI Inject 0.3 mg into the muscle as needed (for allergic reaction).       .  hyoscyamine (ANASPAZ) 0.125 MG TBDP disintergrating tablet Place 0.125 mg under the tongue.      [name] levothyroxine (SYNTHROID, LEVOTHROID) 100 MCG tablet Take 1 tablet (100 mcg total) by mouth daily.  30 tablet  6  . Multiple Vitamin (MULTIVITAMIN WITH MINERALS) TABS Take 1 tablet by mouth daily.      . vitamin C (ASCORBIC ACID) 500 MG tablet Take 500 mg by mouth daily.      . vitamin E (VITAMIN E) 400 UNIT capsule Take 800 Units by mouth daily.      [name] anastrozole (ARIMIDEX) 1 MG tablet Take 1 tablet (1 mg total) by mouth daily.  30 tablet  3  . Eszopiclone 3 MG TABS Take 1 tablet (3 mg total) by mouth at bedtime. Take immediately before bedtime  30 tablet  3  . LORazepam (ATIVAN) 1 MG tablet Take 1 tablet (1 mg total) by mouth every 8 (eight) hours as needed for anxiety.  60 tablet  0  . tamoxifen (NOLVADEX) 20 MG tablet Take 1 tablet (20 mg total) by mouth daily.  30 tablet  12   No current facility-administered medications for this visit.    SURGICAL HISTORY:  Past Surgical History  Procedure Laterality Date  . Brain surgery      removal benign frontal lobe cyst in 1987  . Foot surgery Bilateral 2013    "toes were webbed together; had them separated" (09/29/2013)  . Gynecologic cryosurgery      cervical cancer, 1998 last remained disease free with recent normal pap  . Diagnostic laparoscopy  2004    lysis adh-uterine ablation  . Breast enhancement surgery Bilateral ~ 2008  . Breast surgery Left     removal benign left breast lesion  . Facial cosmetic surgery  ~ 2010  . Axillary sentinel node biopsy  12/31/2012    Procedure: AXILLARY SENTINEL NODE BIOPSY;  Surgeon: Rolm Bookbinder, MD;  Location: [facility];  Service: General;  Laterality: Bilateral;  bilateral sentinel node  . Total mastectomy  12/31/2012    Procedure: TOTAL MASTECTOMY;  Surgeon: Rolm Bookbinder, MD;  Location: [facility];  Service: General;  Laterality: Bilateral;  bilateral total  mastectomies  . Breast reconstruction with placement of tissue expander and flex hd (acellular hydrated dermis)  12/31/2012    Procedure: BREAST RECONSTRUCTION WITH PLACEMENT OF TISSUE EXPANDER AND FLEX HD (ACELLULAR HYDRATED DERMIS);  Surgeon: Theodoro Kos, DO;  Location: [facility];  Service: Plastics;  Laterality: Bilateral;  bilateral immediate breast reconstruction with expanders and flex hd   . Portacath placement  01/27/2013    Procedure: INSERTION PORT-A-CATH;  Surgeon: Rolm Bookbinder, MD;  Location: [facility];  Service: General;  Laterality: Right;  . Incision and drainage of wound Left 04/28/2013    Procedure: IRRIGATION AND DEBRIDEMENT LEFT BREAST WOUND, POSSIBLE CLOSURE OF WOUND WITH DRAIN PLACEMENT;  Surgeon: Theodoro Kos, DO;  Location: [facility];  Service: Plastics;  Laterality:  Left;  . Tee without cardioversion N/A 06/04/2013    Procedure: TRANSESOPHAGEAL ECHOCARDIOGRAM (TEE);  Surgeon: Jolaine Artist, MD;  Location: [facility] END[facility];  Service: Cardiovascular;  Laterality: N/A;  . Tissue expander placement Left 06/16/2013    Procedure: LEFT BREAST TISSUE EXPANDER REMOVAL;  Surgeon: Theodoro Kos, DO;  Location: [facility];  Service: Plastics;  Laterality: Left;  . Reconstruction breast w/ latissimus dorsi flap Left 09/29/2013  . Tissue expander placement Left 09/29/2013  . Abdominal hysterectomy  2005    TAH/BSO-lysis adh, enometriosis  . Dilation and curettage of uterus  2005  . Cervical cerclage    . Latissimus flap to breast Left 09/29/2013    Procedure: LATISSIMUS FLAP TO BREAST WITH TISSUE EXPANDER PLACEMENT FOR LEFT BREAST RECONSTRUCTION;  Surgeon: Theodoro Kos, DO;  Location: [facility];  Service: Plastics;  Laterality: Left;  . Tissue expander placement Left 09/29/2013    Procedure: TISSUE EXPANDER;  Surgeon: Theodoro Kos, DO;  Location: [facility];  Service: Plastics;  Laterality: Left;  . Removal of bilateral tissue  expanders with placement of bilateral breast implants Bilateral 03/09/2014    Procedure: REMOVAL OF BILATERAL TISSUE EXPANDERS WITH PLACEMENT OF BILATERAL BREAST IMPLANTS;  Surgeon: Theodoro Kos, DO;  Location: [facility];  Service: Plastics;  Laterality: Bilateral;  . Liposuction Bilateral 03/09/2014    Procedure: LIPOSUCTION ABDOMEN W/ LIPOFILLING LATERAL BILATERAL BREAST;  Surgeon: Theodoro Kos, DO;  Location: [facility];  Service: Plastics;  Laterality: Bilateral;  LIPOSUCTION NECK  . Port-a-cath removal Right 03/09/2014    Procedure: REMOVAL PORT-A-CATH;  Surgeon: Theodoro Kos, DO;  Location: [facility];  Service: Plastics;  Laterality: Right;    REVIEW OF SYSTEMS:  A 10 point review of systems was conducted and is otherwise negative except for what is noted above.     PHYSICAL EXAMINATION: Blood pressure 126/80, pulse 84, temperature 97.8 F (36.6 C), temperature source Oral, resp. rate 18, height _0  (1.626 m), weight 164 lb 14.4 oz (74.798 kg). Body mass index is 28.29 kg/(m^2). GENERAL: Patient is a well appearing female in no acute distress HEENT:  Sclerae anicteric.  Oropharynx clear and moist. No ulcerations or evidence of oropharyngeal candidiasis. Neck is supple.  NODES:  No cervical, supraclavicular, or axillary lymphadenopathy palpated.  BREAST EXAM: s/p reconstruction, no masses, nodularity, or sign of recurrence.   LUNGS:  Clear to auscultation bilaterally.  No wheezes or rhonchi. HEART:  Regular rate and rhythm. No murmur appreciated. ABDOMEN:  Soft, nontender.  Positive, normoactive bowel sounds. No organomegaly palpated. MSK:  No focal spinal tenderness to palpation. Full range of motion bilaterally in the upper extremities. EXTREMITIES:  No peripheral edema seen.   SKIN:  Clear with no obvious rashes or skin changes. No nail dyscrasia. NEURO:  Nonfocal. Well oriented.  Appropriate affect. ECOG PERFORMANCE STATUS: 1 -  Symptomatic but completely ambulatory   LABORATORY DATA: Lab Results  Component Value Date   WBC 5.5 05/26/2014   HGB 14.3 05/26/2014   HCT 43.6 05/26/2014   MCV 84.8 05/26/2014   PLT 220 05/26/2014      Chemistry      Component Value Date/Time   NA 142 05/26/2014 0921   NA 137 09/21/2013 0842   K 4.1 05/26/2014 0921   K 4.4 09/21/2013 0842   CL 101 09/21/2013 0842   CL 104 06/10/2013 1312   CO2 24 05/26/2014 0921   CO2 25 09/21/2013 0842   BUN 26.4* 05/26/2014 8127  BUN 26* 09/21/2013 0842   CREATININE 0.7 05/26/2014 0921   CREATININE 0.51 09/29/2013 2008      Component Value Date/Time   CALCIUM 9.1 05/26/2014 0921   CALCIUM 9.4 09/21/2013 0842   ALKPHOS 57 05/26/2014 0921   ALKPHOS 52 09/21/2013 0842   AST 27 05/26/2014 0921   AST 29 09/21/2013 0842   ALT 25 05/26/2014 0921   ALT 24 09/21/2013 0842   BILITOT 0.38 05/26/2014 0921   BILITOT 0.3 09/21/2013 0842      STUDIES: No results found.   ASSESSMENT: 58 year old [place] woman:  (1) Status post bilateral mastectomies on 12/31/2012 showing:  (a) On the right, no evidence of malignancy  (b) On the left, a pT2 pN0, stage IIA invasive ductal carcinoma, grade 2, estrogen receptor 100% positive, progesterone receptor 86% positive, with an MIB-1 of 46%, and HER-2 amplification with a CISH ratio of 6.81.  (2) Received adjuvant chemotherapy with Carboplatin/Docetaxel/Trastuzumab from 02/04/2013 through 05/13/2013 and was scheduled to consist of 6 cycles.  Unfortunately, the patient was unable to tolerate the entire course of chemotherapy and subsequently she requested that we discontinued the last cycle of her treatment which would have been cycle #6.  She received a total of 5 of 6 chemotherapy cycles.  She continues to receive every 3 week Trastuzumab (Herceptin).   (3) Currently not taking adjuvant antiestrogen therapy.  She was taking Letrozole, but had difficulty with joint pain, she was given Aromasin to try but never did.  She  attempted Tamoxifen, but had difficulty with increased depression.     (4) Ongoing arthralgias/myalgias   PLAN:   #1 Patient is doing well.  Her labs are stable today.  I reviewed them with her in detail  She has no sign of recurrence.  She will start taking Arimidex, 51m daily.  I gave her detailed information about this in her AVS.    #2 We will get a repeat ultrasound of the thyroid in [place]  The patient will return in 6 months for labs and evaluation by Dr. MJana Hakim   She knows to call [facility] the interim for any questions or concerns.  We can certainly see her sooner if needed.  I spent 25 minutes counseling the patient face to face.  The total time spent in the appointment was 30 minutes.  LMinette Headland [phone]/07/2014, 2:27 PM

## 2014-05-26 NOTE — Patient Instructions (Signed)

## 2014-05-27 ENCOUNTER — Telehealth: Payer: Self-pay | Admitting: Oncology

## 2014-05-27 NOTE — Telephone Encounter (Signed)
s.w. pt adn advised on OCT and DEC appt....pt ok adn aware

## 2014-05-30 ENCOUNTER — Other Ambulatory Visit: Payer: Self-pay | Admitting: Physician Assistant

## 2014-06-14 DIAGNOSIS — C4491 Basal cell carcinoma of skin, unspecified: Secondary | ICD-10-CM | POA: Insufficient documentation

## 2014-06-29 IMAGING — MG MM DIGITAL DIAGNOSTIC LIMITED*L*
3 series · 3 of 3 positions shown · non-contrast
Comparison: Prior studies

CLINICAL DATA: Recall from screening mammography.  Family history
of breast cancer in twin sister at age 44 and her grandmother at
age 68.

DIGITAL DIAGNOSTIC LEFT BREAST MAMMOGRAM

[L CC (1 of 2)]
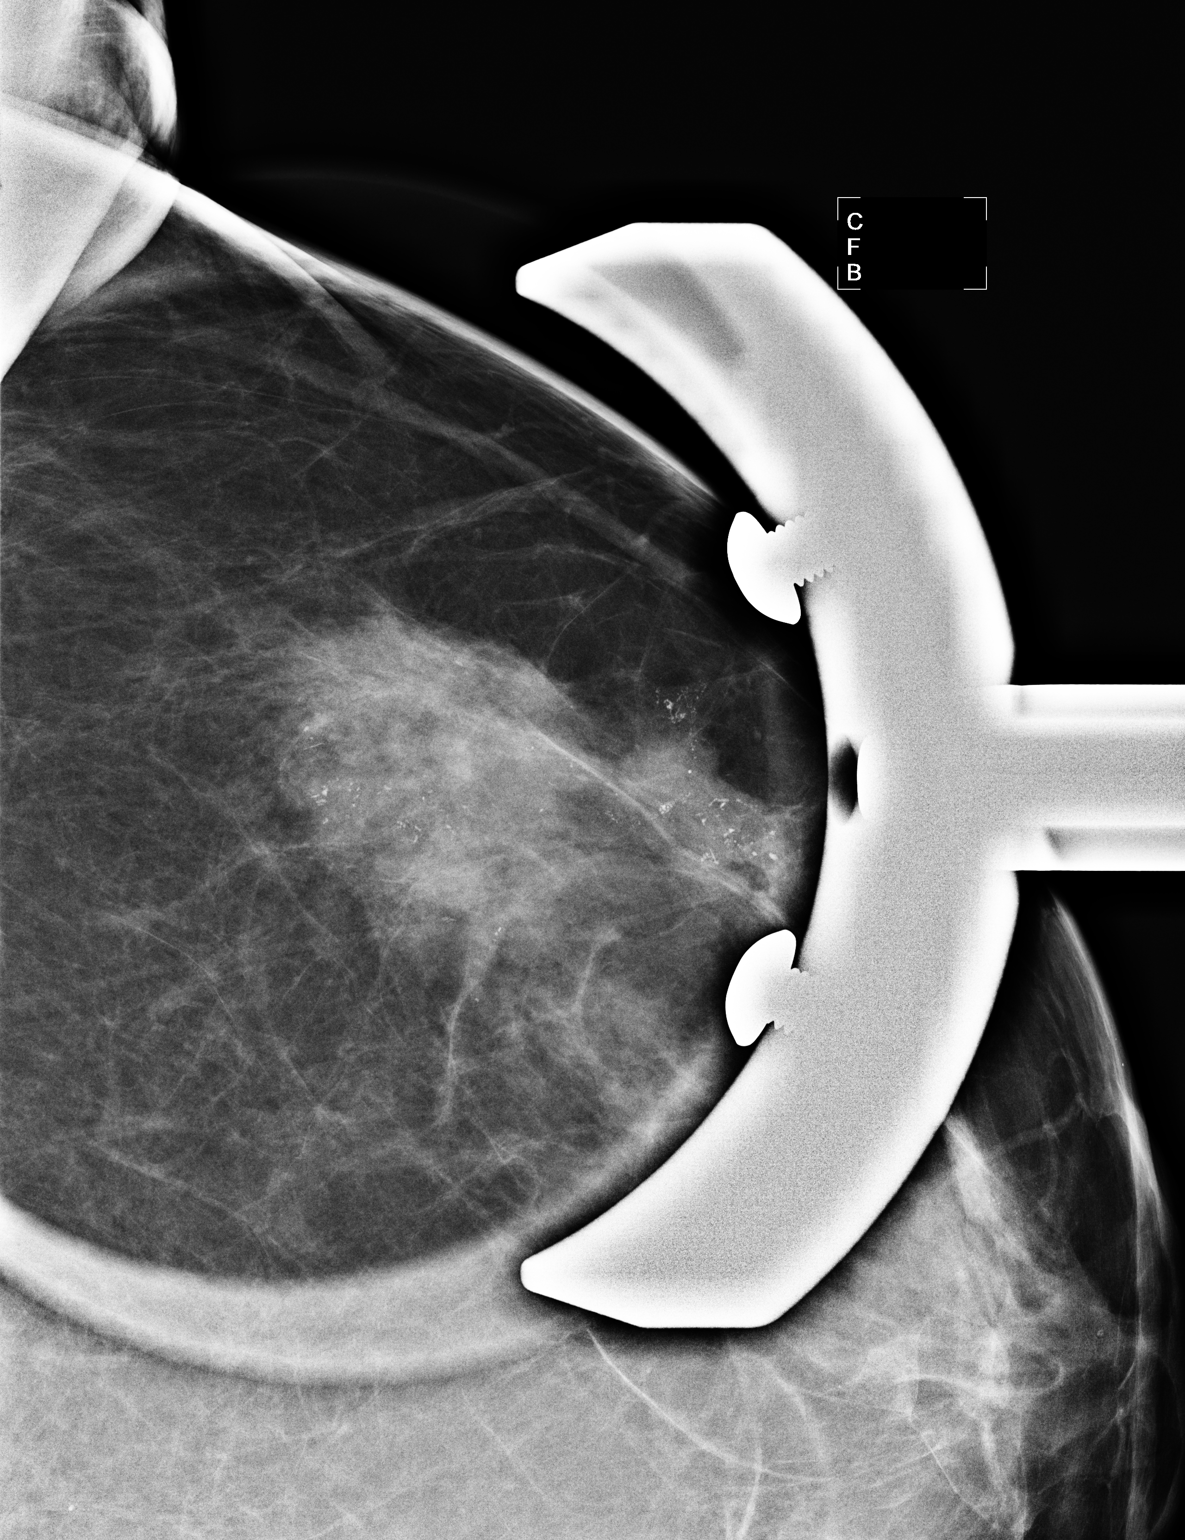

[L ML]
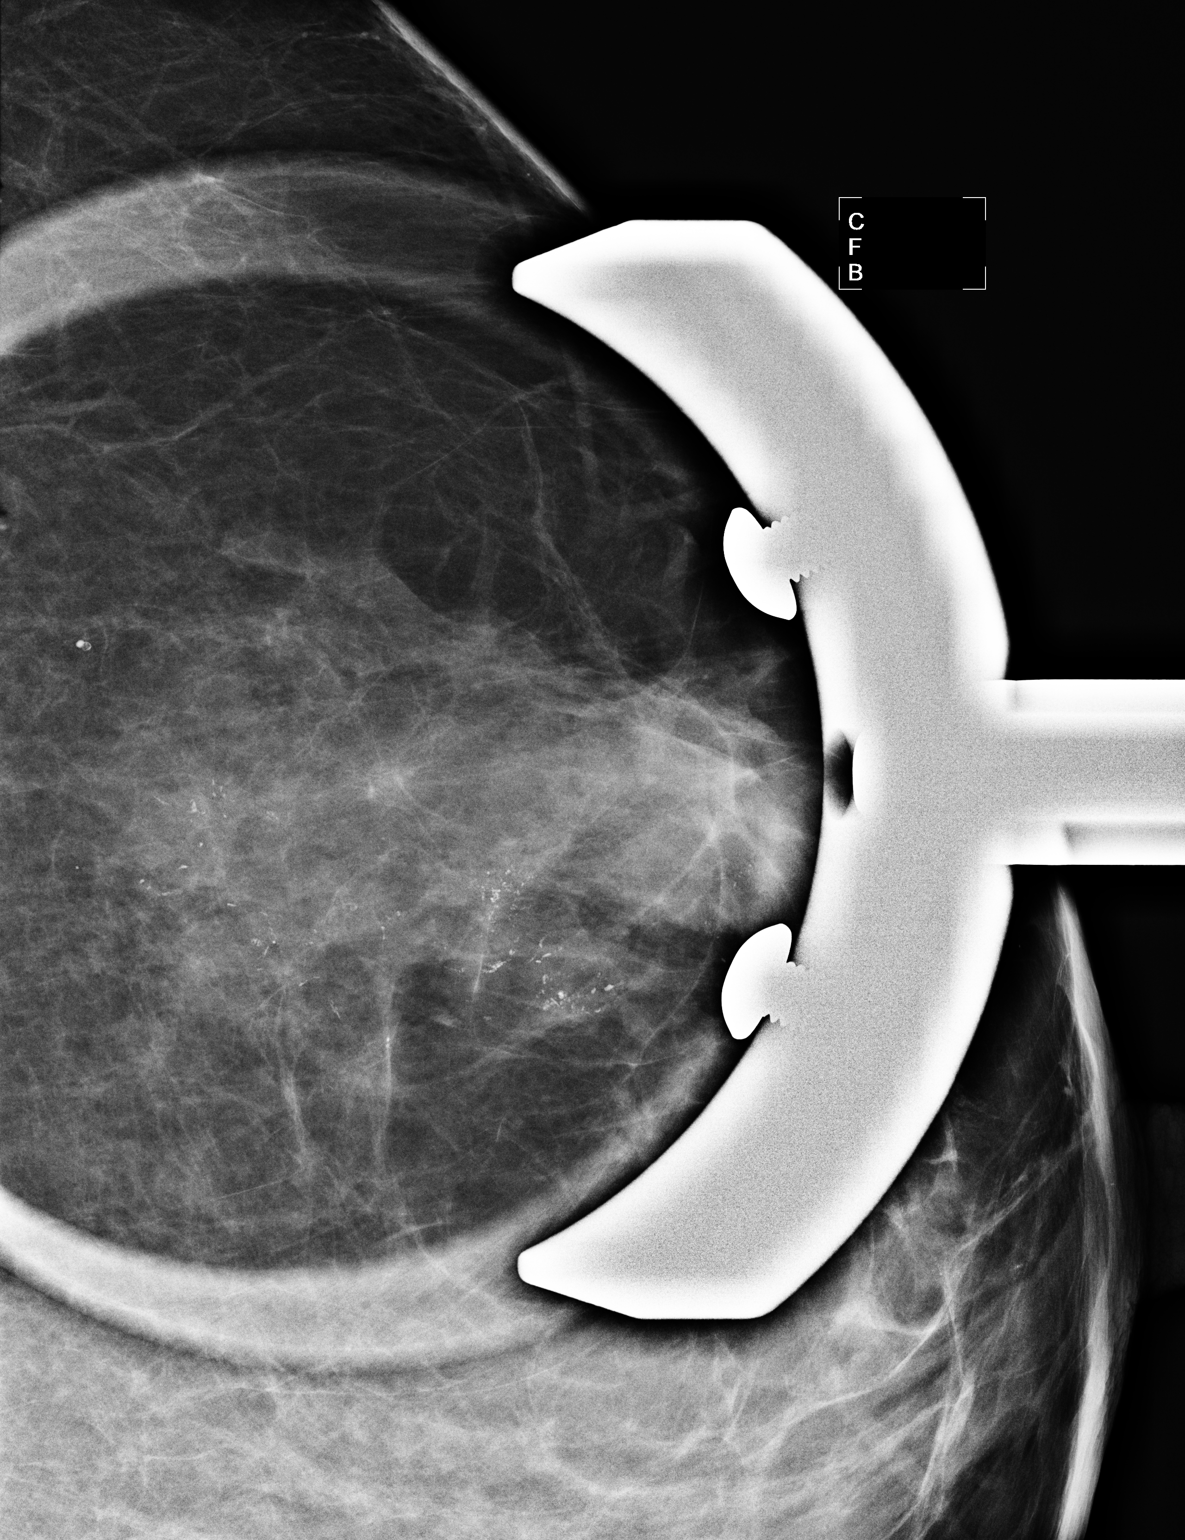

[L CC (2 of 2)]
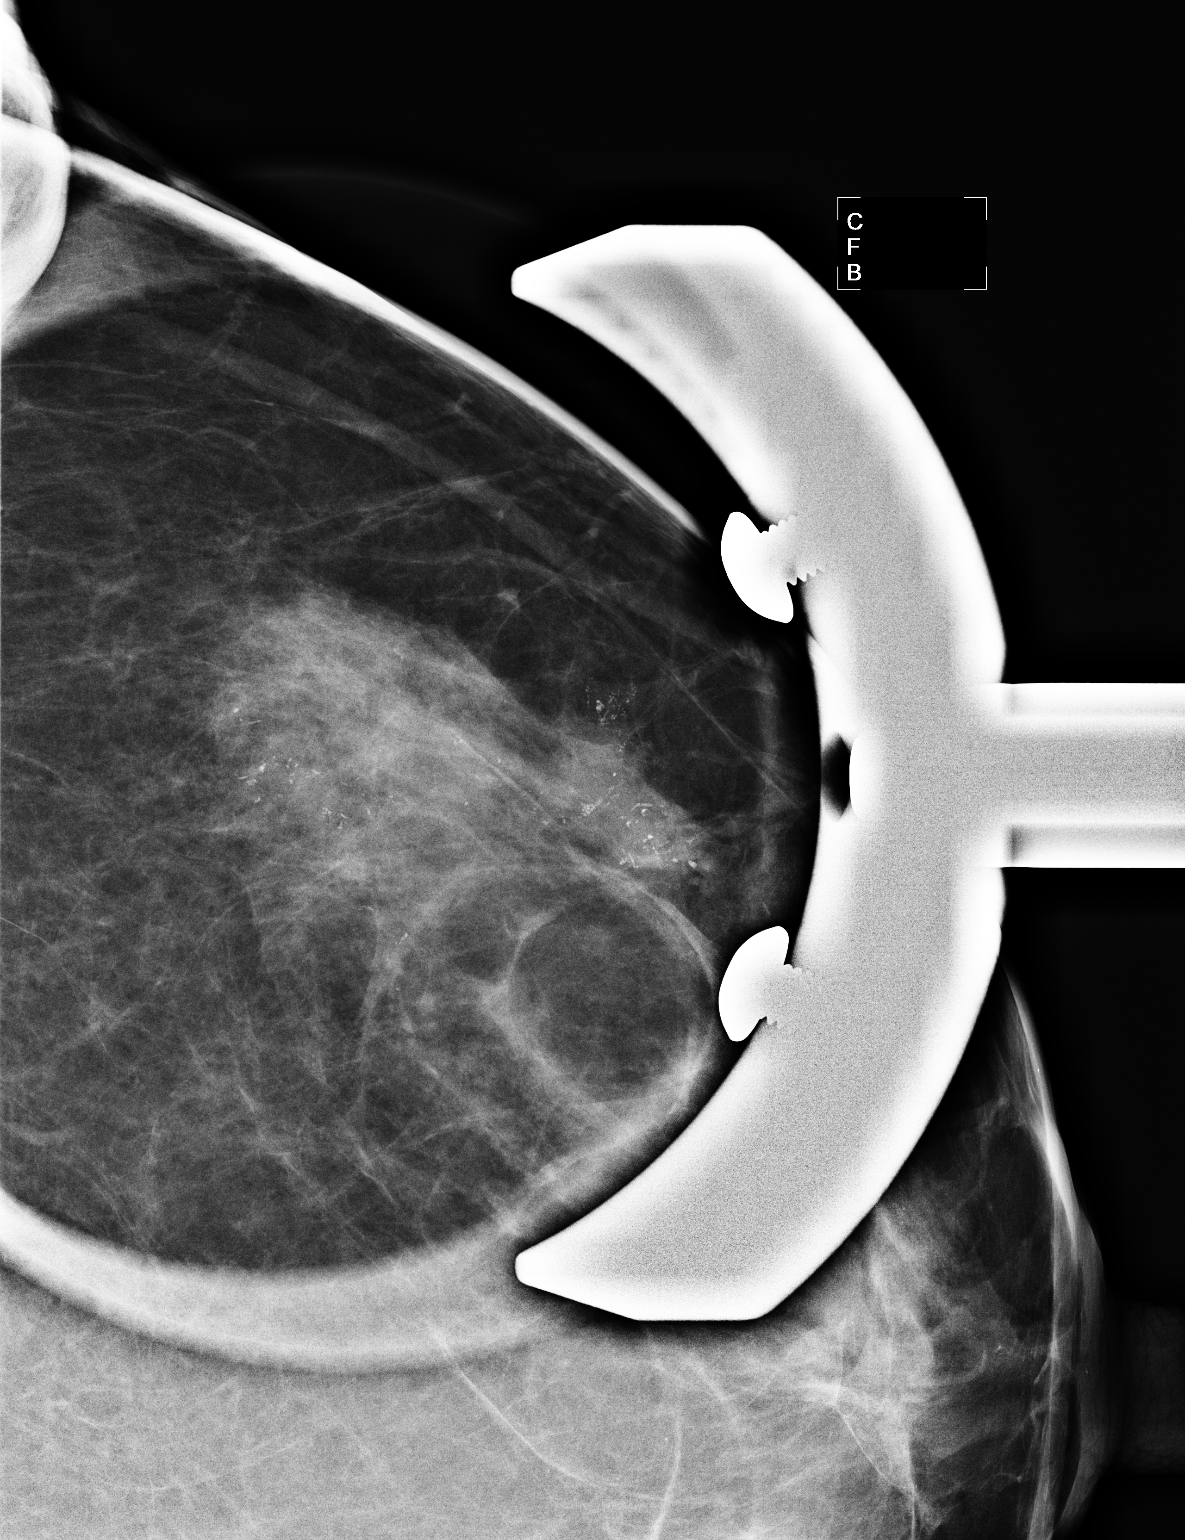

[3 of 3 positions shown; findings below may reference images not displayed]

FINDINGS: ACR Breast Density Category 2: There is a scattered fibroglandular
pattern.

Magnification views of the left breast demonstrate pleomorphic
microcalcifications located in a segmental distribution within the
upper-outer quadrant of the left breast.  These extend for 5.3 cm
in maximal distance.  Tissue sampling is recommended.  I have
discussed stereotactic core biopsy of the calcifications with the
patient.  The patient would like to proceed.  This will be
performed and reported separately.
IMPRESSION: Pleomorphic calcifications located within the upper-outer quadrant
of the left breast which extend for 5.3 cm in diameter.  Tissue
sampling is recommended and stereotactic core biopsy will be
performed and reported separately.

RECOMMENDATION:
Left breast stereotactic biopsy.

I have discussed the findings and recommendations with the patient.
Results were also provided in writing at the conclusion of the
visit.

BI-RADS CATEGORY 4:  Suspicious abnormality - biopsy should be
considered.

## 2014-06-30 ENCOUNTER — Emergency Department (INDEPENDENT_AMBULATORY_CARE_PROVIDER_SITE_OTHER)
Admission: EM | Admit: 2014-06-30 | Discharge: 2014-06-30 | Disposition: A | Discharge status: Home / Self Care | Payer: 59 | Source: Home / Self Care | Attending: Emergency Medicine | Admitting: Emergency Medicine

## 2014-06-30 ENCOUNTER — Encounter (HOSPITAL_COMMUNITY): Payer: Self-pay | Admitting: Emergency Medicine

## 2014-06-30 DIAGNOSIS — H9201 Otalgia, right ear: Secondary | ICD-10-CM

## 2014-06-30 DIAGNOSIS — H9209 Otalgia, unspecified ear: Secondary | ICD-10-CM

## 2014-06-30 MED ORDER — DOXYCYCLINE HYCLATE 100 MG PO TABS: 100.0000 mg | ORAL_TABLET | Freq: Two times a day (BID) | ORAL | Status: DC

## 2014-06-30 MED ORDER — FLUTICASONE PROPIONATE 50 MCG/ACT NA SUSP: 2.0000 | Freq: Every day | NASAL | Status: DC

## 2014-06-30 MED ORDER — MECLIZINE HCL 25 MG PO TABS: 25.0000 mg | ORAL_TABLET | Freq: Three times a day (TID) | ORAL | Status: DC | PRN

## 2014-06-30 NOTE — ED Provider Notes (Signed)
  Chief Complaint   Chief Complaint  Patient presents with  . Dizziness    History of Present Illness   Jane Gutierrez is a 58 year old female who has a history since last night of rhinorrhea, dizziness, vertigo, right earache, sore throat, chills, and cough. She feels like her head is spinning. She feels like there is fluid/around in her right ear. She denies any fever, headache, nasal drainage, or difficulty breathing.  Review of Systems   Other than as noted above, the patient denies any of the following symptoms: Systemic:  No fevers or chills. Eye:  No redness, pain, discharge, itching, blurred vision, or diplopia. ENT:  No headache, nasal congestion, sneezing, itching, epistaxis, ear pain, decreased hearing, ringing in ears, vertigo, or tinnitus.  No oral lesions, sore throat, or hoarseness. Neck:  No neck pain or adenopathy. Skin:  No rash or itching.  [place]   Past medical history, family history, social history, meds, and allergies were reviewed. She is allergic to penicillin, amoxicillin, Septra, and clindamycin. She has a number of medical problems including ductal carcinoma in situ of the breast, IBS, thyroid disease, ADD, Lyme disease, and we'll asthma. Her current meds include albuterol, Arimidex, aspirin, Valium, high ostomy, Synthroid, lorazepam, and Nolvadex.  Physical Examination     Vital signs:  BP 119/76  Pulse 84  Temp(Src) 98.1 F (36.7 C) (Oral)  Resp 18  SpO2 99% General:  Alert and oriented.  In no distress.  Skin warm and dry. Eye:  PERRL, full EOMs, lids and conjunctiva normal.   ENT:  TMs and canals clear.  Nasal mucosa mild congestion with clear to yellow drainage.  Mucous membranes moist, no oral lesions, normal dentition, pharynx clear.  No cranial or facial pain to palplation. There is no pain or swelling over the mastoid. Neck:  Supple, full ROM.  No adenopathy, tenderness or mass.  Thyroid normal. Lungs:  Breath sounds clear and equal bilaterally.   No wheezes, rales or rhonchi. Heart:  Rhythm regular, without extrasystoles.  No gallops or murmers. Skin:  Clear, warm and dry.   Assessment   The encounter diagnosis was Otalgia of right ear.  She has ear pain and other ear symptoms with negative findings on examination suggesting eustachian tube salpingitis.  Plan    1.  Meds:  The following meds were prescribed:   Discharge Medication List as of 06/30/2014  8:32 AM    START taking these medications   Details  doxycycline (VIBRA-TABS) 100 MG tablet Take 1 tablet (100 mg total) by mouth 2 (two) times daily., Starting 06/30/2014, Until Discontinued, Normal    fluticasone (FLONASE) 50 MCG/ACT nasal spray Place 2 sprays into both nostrils daily., Starting 06/30/2014, Until Discontinued, Normal    meclizine (ANTIVERT) 25 MG tablet Take 1 tablet (25 mg total) by mouth 3 (three) times daily as needed for dizziness., Starting 06/30/2014, Until Discontinued, Normal        2.  Patient Education/Counseling:  The patient was given appropriate handouts, self care instructions, and instructed in symptomatic relief.    3.  Follow up:  The patient was told to follow up here if no better in 3 to 4 days, or sooner if becoming worse in any way, and given some red flag symptoms such as worsening pain which would prompt immediate return.       Harden Mo, MD 06/30/14 [phone]

## 2014-06-30 NOTE — ED Notes (Signed)
Concern for poss ear problem; c/o dizzy

## 2014-06-30 NOTE — Discharge Instructions (Signed)
Otalgia  The most common reason for this in children is an infection of the middle ear. Pain from the middle ear is usually caused by a build-up of fluid and pressure behind the eardrum. Pain from an earache can be sharp, dull, or burning. The pain may be temporary or constant. The middle ear is connected to the nasal passages by a short narrow tube called the Eustachian tube. The Eustachian tube allows fluid to drain out of the middle ear, and helps keep the pressure in your ear equalized.  CAUSES   A cold or allergy can block the Eustachian tube with inflammation and the build-up of secretions. This is especially likely in small children, because their Eustachian tube is shorter and more horizontal. When the Eustachian tube closes, the normal flow of fluid from the middle ear is stopped. Fluid can accumulate and cause stuffiness, pain, hearing loss, and an ear infection if germs start growing in this area.  SYMPTOMS   The symptoms of an ear infection may include fever, ear pain, fussiness, increased crying, and irritability. Many children will have temporary and minor hearing loss during and right after an ear infection. Permanent hearing loss is rare, but the risk increases the more infections a child has. Other causes of ear pain include retained water in the outer ear canal from swimming and bathing.  Ear pain in adults is less likely to be from an ear infection. Ear pain may be referred from other locations. Referred pain may be from the joint between your jaw and the skull. It may also come from a tooth problem or problems in the neck. Other causes of ear pain include:   A foreign body in the ear.   Outer ear infection.   Sinus infections.   Impacted ear wax.   Ear injury.   Arthritis of the jaw or TMJ problems.   Middle ear infection.   Tooth infections.   Sore throat with pain to the ears.  DIAGNOSIS   Your caregiver can usually make the diagnosis by examining you. Sometimes other special studies,  including x-rays and lab work may be necessary.  TREATMENT    If antibiotics were prescribed, use them as directed and finish them even if you or your child's symptoms seem to be improved.   Sometimes PE tubes are needed in children. These are little plastic tubes which are put into the eardrum during a simple surgical procedure. They allow fluid to drain easier and allow the pressure in the middle ear to equalize. This helps relieve the ear pain caused by pressure changes.  HOME CARE INSTRUCTIONS    Only take over-the-counter or prescription medicines for pain, discomfort, or fever as directed by your caregiver. DO NOT GIVE CHILDREN ASPIRIN because of the association of Reye's Syndrome in children taking aspirin.   Use a cold pack applied to the outer ear for 15-20 minutes, 03-04 times per day or as needed may reduce pain. Do not apply ice directly to the skin. You may cause frost bite.   Over-the-counter ear drops used as directed may be effective. Your caregiver may sometimes prescribe ear drops.   Resting in an upright position may help reduce pressure in the middle ear and relieve pain.   Ear pain caused by rapidly descending from high altitudes can be relieved by swallowing or chewing gum. Allowing infants to suck on a bottle during airplane travel can help.   Do not smoke in the house or near children. If you are   unable to quit smoking, smoke outside.   Control allergies.  SEEK IMMEDIATE MEDICAL CARE IF:    You or your child are becoming sicker.   Pain or fever relief is not obtained with medicine.   You or your child's symptoms (pain, fever, or irritability) do not improve within 24 to 48 hours or as instructed.   Severe pain suddenly stops hurting. This may indicate a ruptured eardrum.   You or your children develop new problems such as severe headaches, stiff neck, difficulty swallowing, or swelling of the face or around the ear.  Document Released: 07/26/2004 Document Revised: 03/02/2012  Document Reviewed: 11/30/2008  ExitCare Patient Information 2015 [employer]. This information is not intended to replace advice given to you by your health care provider. Make sure you discuss any questions you have with your health care provider.

## 2014-07-22 ENCOUNTER — Other Ambulatory Visit: Payer: Self-pay | Admitting: Oncology

## 2014-07-22 DIAGNOSIS — E041 Nontoxic single thyroid nodule: Secondary | ICD-10-CM

## 2014-07-22 DIAGNOSIS — C50919 Malignant neoplasm of unspecified site of unspecified female breast: Secondary | ICD-10-CM

## 2014-08-01 ENCOUNTER — Other Ambulatory Visit: Payer: Self-pay | Admitting: Adult Health

## 2014-08-02 ENCOUNTER — Telehealth: Payer: Self-pay | Admitting: Hematology and Oncology

## 2014-08-02 NOTE — Telephone Encounter (Signed)
Spk w/pt confirming labs/ov per 08/10 POF...Marland KitchenMarland KitchenKJ

## 2014-08-08 ENCOUNTER — Ambulatory Visit (HOSPITAL_COMMUNITY)
Admission: RE | Admit: 2014-08-08 | Discharge: 2014-08-08 | Disposition: A | Discharge status: Home / Self Care | Payer: 59 | Source: Ambulatory Visit | Attending: Cardiology | Admitting: Cardiology

## 2014-08-08 ENCOUNTER — Encounter (HOSPITAL_COMMUNITY): Payer: Self-pay

## 2014-08-08 VITALS — BP 111/72 | HR 88 | Resp 18 | Wt 164.5 lb

## 2014-08-08 DIAGNOSIS — C539 Malignant neoplasm of cervix uteri, unspecified: Secondary | ICD-10-CM | POA: Insufficient documentation

## 2014-08-08 DIAGNOSIS — K589 Irritable bowel syndrome without diarrhea: Secondary | ICD-10-CM | POA: Insufficient documentation

## 2014-08-08 DIAGNOSIS — Z91013 Allergy to seafood: Secondary | ICD-10-CM | POA: Insufficient documentation

## 2014-08-08 DIAGNOSIS — Z170 Estrogen receptor positive status [ER+]: Secondary | ICD-10-CM | POA: Insufficient documentation

## 2014-08-08 DIAGNOSIS — M129 Arthropathy, unspecified: Secondary | ICD-10-CM | POA: Insufficient documentation

## 2014-08-08 DIAGNOSIS — R0609 Other forms of dyspnea: Secondary | ICD-10-CM

## 2014-08-08 DIAGNOSIS — R002 Palpitations: Secondary | ICD-10-CM

## 2014-08-08 DIAGNOSIS — C50919 Malignant neoplasm of unspecified site of unspecified female breast: Secondary | ICD-10-CM | POA: Diagnosis present

## 2014-08-08 DIAGNOSIS — Z888 Allergy status to other drugs, medicaments and biological substances status: Secondary | ICD-10-CM | POA: Diagnosis not present

## 2014-08-08 DIAGNOSIS — F988 Other specified behavioral and emotional disorders with onset usually occurring in childhood and adolescence: Secondary | ICD-10-CM | POA: Insufficient documentation

## 2014-08-08 DIAGNOSIS — E039 Hypothyroidism, unspecified: Secondary | ICD-10-CM | POA: Insufficient documentation

## 2014-08-08 DIAGNOSIS — J45909 Unspecified asthma, uncomplicated: Secondary | ICD-10-CM | POA: Diagnosis not present

## 2014-08-08 DIAGNOSIS — R0989 Other specified symptoms and signs involving the circulatory and respiratory systems: Secondary | ICD-10-CM | POA: Insufficient documentation

## 2014-08-08 DIAGNOSIS — Z882 Allergy status to sulfonamides status: Secondary | ICD-10-CM | POA: Insufficient documentation

## 2014-08-08 DIAGNOSIS — I079 Rheumatic tricuspid valve disease, unspecified: Secondary | ICD-10-CM | POA: Insufficient documentation

## 2014-08-08 DIAGNOSIS — Z880 Allergy status to penicillin: Secondary | ICD-10-CM | POA: Diagnosis not present

## 2014-08-08 DIAGNOSIS — R0600 Dyspnea, unspecified: Secondary | ICD-10-CM

## 2014-08-08 DIAGNOSIS — Z7982 Long term (current) use of aspirin: Secondary | ICD-10-CM | POA: Diagnosis not present

## 2014-08-08 NOTE — Progress Notes (Signed)
Patient ID: Jane Gutierrez, female   DOB: May 06, 1956, 58 y.o.   MRN: [id number] PCP:  Dr Forde Dandy General Surgeon: Dr Donne Hazel Plastic Surgeon: Dr Eustace Quail is 58 year old [facility] liaison with a PMH of IBS, asthma, hypothroidism and triple-positive breast CA. ER +100% PR +86% HER-2/neu (Sentinel nodes were negative for metastatic disease).  She is s/p bilateral mastectomies with reconstruction.    Plan for adjuvant chemotherapy and Herceptin.  On 02/04/13 she started Taxotere/carboplatinum to be given every 3 weeks for a total of 6 cycles. Since then, she has been on Herceptin alone.  She completed Herceptin in 2/15.    VQ 05/27/13 Negative for PE  Echos: 01/26/13 EF 60% lateral S' 40.9 Grade II diastolic dysfunction 07/23/18 EF 60-65% lat s' 12.8  RV normal 05/27/13 EF lat S' 12.5 RV mildly dilated. Normal function. +RAE. IVC dilated 2.8 cm. Mod TR  RVSP 35-40.  06/04/13 TEE EF 55-60% mild-mod TR 09/09/13 EF 55-60% lateral s' 12.1 RV ok. Mild TR RVSP 25-30 12/06/13 EF 55-60% lateral s' 11.6, GS -21%, RV ok. 3/15 EF 55-60%, lateral s' 12, global longitudinal strain -19.8%, RV normal, mild RAE  Follow up: Completed Herceptin 02/17/14 and underwent breast implant surgery in March 2015 and expanders taken out. In June starting working out and started noticing some dizziness with working out and SOB. Has noticed some palpitations. Able to do spin class 3x last week but has to pace herself. Occasional SOB with going upstairs and LE edema. + dry hacking cough. Denies CP.     Review of Systems: All pertinent positives and negatives as in HPI, otherwise negative.   Past Medical History  Diagnosis Date  . IBS (irritable bowel syndrome)   . ADD (attention deficit disorder)   . Lyme disease   . Contact lens/glasses fitting     wears contacts or glasses  . Allergy   . Hypothyroidism   . Tricuspid regurgitation     "from the chemo" (09/29/2013)  . Exertional shortness of breath     "probably from the Cerpetiian  I'm taking" (09/29/2013)  . Arthritis     "hands" (09/29/2013)  . Breast cancer 12/04/12    left, ER/PR +  . Cervical cancer     Current Outpatient Prescriptions  Medication Sig Dispense Refill  . albuterol (PROVENTIL HFA;VENTOLIN HFA) 108 (90 BASE) MCG/ACT inhaler Inhale 2 puffs into the lungs every 6 (six) hours as needed for shortness of breath.  2 Inhaler  6  . anastrozole (ARIMIDEX) 1 MG tablet Take 1 tablet (1 mg total) by mouth daily.  30 tablet  3  . aspirin 81 MG tablet Take 81 mg by mouth daily.      . Gutierrez Complex-C (Gutierrez-COMPLEX WITH VITAMIN C) tablet Take 1 tablet by mouth daily.      . diazepam (VALIUM) 2 MG tablet Take 1 tablet (2 mg total) by mouth every 12 (twelve) hours as needed.  30 tablet  0  . doxycycline (VIBRA-TABS) 100 MG tablet Take 1 tablet (100 mg total) by mouth 2 (two) times daily.  20 tablet  0  . EPINEPHrine (EPIPEN) 0.3 mg/0.3 mL DEVI Inject 0.3 mg into the muscle as needed (for allergic reaction).       . Eszopiclone 3 MG TABS Take 1 tablet (3 mg total) by mouth at bedtime. Take immediately before bedtime  30 tablet  3  . fluticasone (FLONASE) 50 MCG/ACT nasal spray Place 2 sprays into both nostrils daily.  16 g  0  . hyoscyamine (ANASPAZ) 0.125 MG TBDP disintergrating tablet Place 0.125 mg under the tongue.      [name] LORazepam (ATIVAN) 1 MG tablet TAKE 1 TABLET BY MOUTH EVERY 8 HOURS AS NEEDED FOR ANXIETY  60 tablet  0  . meclizine (ANTIVERT) 25 MG tablet Take 1 tablet (25 mg total) by mouth 3 (three) times daily as needed for dizziness.  30 tablet  0  . Multiple Vitamin (MULTIVITAMIN WITH MINERALS) TABS Take 1 tablet by mouth daily.      [name] SYNTHROID 100 MCG tablet TAKE 1 TABLET BY MOUTH DAILY.  30 tablet  4  . vitamin C (ASCORBIC ACID) 500 MG tablet Take 500 mg by mouth daily.      . vitamin E (VITAMIN E) 400 UNIT capsule Take 800 Units by mouth daily.       No current facility-administered medications for this encounter.     Allergies  Allergen Reactions  .  Betadine [Povidone Iodine] Anaphylaxis  . Iodine Anaphylaxis    REACTION: anaphylactic  . Shellfish-Derived Products   . Sulfamethoxazole-Trimethoprim Other (See Comments)    Allergy from long ago unsure of reaction.   [name] Cleocin [Clindamycin Hcl] Rash  . Penicillins Rash    Filed Vitals:   08/08/14 1342  BP: 111/72  Pulse: 88  Resp: 18  Weight: 164 lb 8 oz (74.617 kg)  SpO2: 100%    PHYSICAL EXAM: General:  Well appearing. No respiratory difficulty HEENT: normal Neck: supple.  JVP flat Carotids 2+ bilat; no bruits. No lymphadenopathy or thryomegaly appreciated. Cor: PMI nondisplaced. Regular rate & rhythm. No rubs, gallops  Lungs: clear Abdomen: soft, nontender, nondistended. No hepatosplenomegaly. No bruits or masses. Good bowel sounds. Extremities: no cyanosis, clubbing, rash, trace edema on RLE.  Neuro: alert & oriented x 3, cranial nerves grossly intact. moves all 4 extremities w/o difficulty. Affect pleasant.   ASSESSMENT & PLAN:  1) L Breast Cancer- S/P bilateral mastectomies.  She completed Herceptin in 2/15.  I reviewed her echo today.  LV EF and lateral s'/strain pattern were all stable compared to prior.  She will followup prn at this point.   2) Exertional dyspnea 3) Palpitations  Jane Bame B,MD 08/08/2014  Patient seen and examined with Jane Bame, NP. We discussed all aspects of the encounter. I agree with the assessment and plan as stated above.   Overall improving but still having problems with intermittent exertional dyspnea and palpitations which can be limiting at times. Cardiac exam is normal. Will repeat echo to look for any long-term effects of chemo and place 48 hour holter to evaluate palpitations. She will continue her exercise program and I will see back in 2 months. If symptoms not improving or worse she will call prior to that and we will see her.  Jane Gutierrez 2:33 PM

## 2014-08-08 NOTE — Patient Instructions (Signed)
Your physician has recommended that you wear a holter monitor. Holter monitors are medical devices that record the heart's electrical activity. Doctors most often use these monitors to diagnose arrhythmias. Arrhythmias are problems with the speed or rhythm of the heartbeat. The monitor is a small, portable device. You can wear one while you do your normal daily activities. This is usually used to diagnose what is causing palpitations/syncope (passing out).  Your physician has requested that you have an echocardiogram. Echocardiography is a painless test that uses sound waves to create images of your heart. It provides your doctor with information about the size and shape of your heart and how well your heart's chambers and valves are working. This procedure takes approximately one hour. There are no restrictions for this procedure.  Your physician recommends that you schedule a follow-up appointment in: 2 months

## 2014-08-08 NOTE — Addendum Note (Signed)
Encounter addended by: Scarlette Calico, RN on: 08/08/2014  2:37 PM<BR>     Documentation filed: Visit Diagnoses, Patient Instructions Section, Orders

## 2014-08-10 ENCOUNTER — Encounter: Payer: Self-pay | Admitting: *Deleted

## 2014-08-10 ENCOUNTER — Encounter (INDEPENDENT_AMBULATORY_CARE_PROVIDER_SITE_OTHER): Payer: 59

## 2014-08-10 DIAGNOSIS — R002 Palpitations: Secondary | ICD-10-CM

## 2014-08-10 NOTE — Progress Notes (Signed)
Patient ID: Jane Gutierrez, female   DOB: November 15, 1956, 58 y.o.   MRN: [id number] [facility] 48 hour holter monitor applied to patient.

## 2014-08-22 ENCOUNTER — Ambulatory Visit (HOSPITAL_COMMUNITY)
Admission: RE | Admit: 2014-08-22 | Discharge: 2014-08-22 | Disposition: A | Discharge status: Home / Self Care | Payer: 59 | Source: Ambulatory Visit | Attending: Endocrinology | Admitting: Endocrinology

## 2014-08-22 DIAGNOSIS — I369 Nonrheumatic tricuspid valve disorder, unspecified: Secondary | ICD-10-CM

## 2014-08-22 DIAGNOSIS — C50919 Malignant neoplasm of unspecified site of unspecified female breast: Secondary | ICD-10-CM | POA: Insufficient documentation

## 2014-08-22 DIAGNOSIS — R0989 Other specified symptoms and signs involving the circulatory and respiratory systems: Secondary | ICD-10-CM | POA: Diagnosis present

## 2014-08-22 DIAGNOSIS — R0609 Other forms of dyspnea: Secondary | ICD-10-CM | POA: Diagnosis present

## 2014-08-22 DIAGNOSIS — R002 Palpitations: Secondary | ICD-10-CM | POA: Insufficient documentation

## 2014-08-22 DIAGNOSIS — R0600 Dyspnea, unspecified: Secondary | ICD-10-CM

## 2014-08-22 NOTE — Progress Notes (Signed)
  Echocardiogram 2D Echocardiogram has been performed.  Jane Gutierrez 08/22/2014, 11:05 AM

## 2014-08-25 ENCOUNTER — Encounter (HOSPITAL_BASED_OUTPATIENT_CLINIC_OR_DEPARTMENT_OTHER): Payer: Self-pay | Admitting: *Deleted

## 2014-08-30 ENCOUNTER — Other Ambulatory Visit: Payer: Self-pay | Admitting: Plastic Surgery

## 2014-08-30 DIAGNOSIS — T8544XS Capsular contracture of breast implant, sequela: Secondary | ICD-10-CM

## 2014-08-30 NOTE — H&P (Signed)
Jane Gutierrez is an 58 y.o. female.   Chief Complaint: capsular contracture of left breast HPI: The patient is a 58 yrs old wf here for follow up on her bilateral breast reconstruction. She had a latissimus flap with expander placement on the left and implant placement on the left after bilateral mastectomies. She now has mild asymmetry. She is otherwise doing very well but would like to be better so she can fit into one bra without padding.  Past Medical History  Diagnosis Date  . IBS (irritable bowel syndrome)   . ADD (attention deficit disorder)   . Hypothyroidism   . History of breast cancer   . History of cervical cancer   . History of palpitations     last echo 08/22/2014  . Abnormally small mouth   . Dental crowns present   . Difficulty swallowing pills   . Asthma     prn inhaler    Past Surgical History  Procedure Laterality Date  . Brain surgery      removal benign frontal lobe cyst in 1987  . Foot surgery Bilateral 07/2012  . Gynecologic cryosurgery  1998  . Breast enhancement surgery Bilateral ~ 2008  . Facial cosmetic surgery  ~ 2010  . Axillary sentinel node biopsy  12/31/2012    Procedure: AXILLARY SENTINEL NODE BIOPSY;  Surgeon: Rolm Bookbinder, MD;  Location: [place];  Service: General;  Laterality: Bilateral;  bilateral sentinel node  . Total mastectomy  12/31/2012    Procedure: TOTAL MASTECTOMY;  Surgeon: Rolm Bookbinder, MD;  Location: [place];  Service: General;  Laterality: Bilateral;  bilateral total mastectomies  . Breast reconstruction with placement of tissue expander and flex hd (acellular hydrated dermis)  12/31/2012    Procedure: BREAST RECONSTRUCTION WITH PLACEMENT OF TISSUE EXPANDER AND FLEX HD (ACELLULAR HYDRATED DERMIS);  Surgeon: Theodoro Kos, DO;  Location: [place];  Service: Plastics;  Laterality: Bilateral;  bilateral immediate breast reconstruction with expanders and flex hd   . Portacath placement   01/27/2013    Procedure: INSERTION PORT-A-CATH;  Surgeon: Rolm Bookbinder, MD;  Location: [place];  Service: General;  Laterality: Right;  . Incision and drainage of wound Left 04/28/2013    Procedure: IRRIGATION AND DEBRIDEMENT LEFT BREAST WOUND, POSSIBLE CLOSURE OF WOUND WITH DRAIN PLACEMENT;  Surgeon: Theodoro Kos, DO;  Location: [place];  Service: Plastics;  Laterality: Left;  . Tee without cardioversion N/A 06/04/2013    Procedure: TRANSESOPHAGEAL ECHOCARDIOGRAM (TEE);  Surgeon: Jolaine Artist, MD;  Location: [facility];  Service: Cardiovascular;  Laterality: N/A;  . Tissue expander placement Left 06/16/2013    Procedure: LEFT BREAST TISSUE EXPANDER REMOVAL;  Surgeon: Theodoro Kos, DO;  Location: [employer] [facility];  Service: Plastics;  Laterality: Left;  . Dilation and curettage of uterus  2005  . Cervical cerclage    . Latissimus flap to breast Left 09/29/2013    Procedure: LATISSIMUS FLAP TO BREAST WITH TISSUE EXPANDER PLACEMENT FOR LEFT BREAST RECONSTRUCTION;  Surgeon: Theodoro Kos, DO;  Location: [place];  Service: Plastics;  Laterality: Left;  . Tissue expander placement Left 09/29/2013    Procedure: TISSUE EXPANDER;  Surgeon: Theodoro Kos, DO;  Location: [place];  Service: Plastics;  Laterality: Left;  . Removal of bilateral tissue expanders with placement of bilateral breast implants Bilateral 03/09/2014    Procedure: REMOVAL OF BILATERAL TISSUE EXPANDERS WITH PLACEMENT OF BILATERAL BREAST IMPLANTS;  Surgeon: Theodoro Kos, DO;  Location:  [place];  Service: Plastics;  Laterality: Bilateral;  . Liposuction Bilateral 03/09/2014    Procedure: LIPOSUCTION ABDOMEN W/ LIPOFILLING LATERAL BILATERAL BREAST;  Surgeon: Theodoro Kos, DO;  Location: [place];  Service: Plastics;  Laterality: Bilateral;  LIPOSUCTION NECK  . Port-a-cath removal Right 03/09/2014    Procedure: REMOVAL PORT-A-CATH;  Surgeon: Theodoro Kos,  DO;  Location: [place];  Service: Plastics;  Laterality: Right;  . Total abdominal hysterectomy w/ bilateral salpingoophorectomy  03/28/2004  . Lysis of adhesion  03/28/2004    pelvic  . Abdominoplasty  03/28/2004  . Lipectomy Bilateral 03/28/2004    hip  . Endometrial fulguration  05/12/2003  . Radiofrequency ablation nerves  05/12/2003    uterosacral nerve ablation  . Diagnostic laparoscopy  05/12/2003    right partial salpingectomy; lysis paraovarian adhesions left    Family History  Problem Relation Age of Onset  . Cancer Mother 31    uterine, mult myeloma  . CVA Father   . Cancer Sister 28    breast  . Cancer Maternal Grandmother 44    breast   Social History:  reports that she has never smoked. She has never used smokeless tobacco. She reports that she drinks alcohol. She reports that she does not use illicit drugs.  Allergies:  Allergies  Allergen Reactions  . Betadine [Povidone Iodine] Anaphylaxis  . Iodine Anaphylaxis  . Shellfish-Derived Products Anaphylaxis  . Cleocin [Clindamycin Hcl] Rash  . Ginger Rash  . Penicillins Rash  . Sulfamethoxazole-Trimethoprim Rash     (Not in a hospital admission)  No results found for this or any previous visit (from the past 48 hour(s)). No results found.  Review of Systems  Constitutional: Negative.   HENT: Negative.   Eyes: Negative.   Respiratory: Negative.   Cardiovascular: Negative.   Gastrointestinal: Negative.   Genitourinary: Negative.   Musculoskeletal: Negative.   Skin: Negative.   Neurological: Negative.   Psychiatric/Behavioral: Negative.     There were no vitals taken for this visit. Physical Exam  Constitutional: She is oriented to person, place, and time. She appears well-developed and well-nourished.  HENT:  Head: Normocephalic and atraumatic.  Eyes: Conjunctivae and EOM are normal. Pupils are equal, round, and reactive to light.  Respiratory: Effort normal.  GI: Soft.  Neurological:  She is alert and oriented to person, place, and time.  Skin: Skin is warm.  Psychiatric: She has a normal mood and affect. Her behavior is normal. Judgment and thought content normal.     Assessment/Plan Capsule contracture release of left breast with lipofilling for symmetry  [facility] 08/30/2014, 11:37 AM

## 2014-09-01 ENCOUNTER — Encounter (HOSPITAL_BASED_OUTPATIENT_CLINIC_OR_DEPARTMENT_OTHER)
Admission: RE | Disposition: A | Discharge status: Home / Self Care | Payer: Self-pay | Source: Ambulatory Visit | Attending: Plastic Surgery

## 2014-09-01 ENCOUNTER — Ambulatory Visit (HOSPITAL_BASED_OUTPATIENT_CLINIC_OR_DEPARTMENT_OTHER): Payer: 59 | Admitting: Anesthesiology

## 2014-09-01 ENCOUNTER — Ambulatory Visit (HOSPITAL_BASED_OUTPATIENT_CLINIC_OR_DEPARTMENT_OTHER)
Admission: RE | Admit: 2014-09-01 | Discharge: 2014-09-01 | Disposition: A | Discharge status: Home / Self Care | Payer: 59 | Source: Ambulatory Visit | Attending: Plastic Surgery | Admitting: Plastic Surgery

## 2014-09-01 ENCOUNTER — Encounter (HOSPITAL_BASED_OUTPATIENT_CLINIC_OR_DEPARTMENT_OTHER): Payer: 59 | Admitting: Anesthesiology

## 2014-09-01 ENCOUNTER — Encounter (HOSPITAL_BASED_OUTPATIENT_CLINIC_OR_DEPARTMENT_OTHER): Payer: Self-pay | Admitting: *Deleted

## 2014-09-01 DIAGNOSIS — Z880 Allergy status to penicillin: Secondary | ICD-10-CM | POA: Insufficient documentation

## 2014-09-01 DIAGNOSIS — T8544XA Capsular contracture of breast implant, initial encounter: Secondary | ICD-10-CM | POA: Insufficient documentation

## 2014-09-01 DIAGNOSIS — K589 Irritable bowel syndrome without diarrhea: Secondary | ICD-10-CM | POA: Insufficient documentation

## 2014-09-01 DIAGNOSIS — Z888 Allergy status to other drugs, medicaments and biological substances status: Secondary | ICD-10-CM | POA: Diagnosis not present

## 2014-09-01 DIAGNOSIS — R002 Palpitations: Secondary | ICD-10-CM | POA: Diagnosis not present

## 2014-09-01 DIAGNOSIS — Z9010 Acquired absence of unspecified breast and nipple: Secondary | ICD-10-CM | POA: Insufficient documentation

## 2014-09-01 DIAGNOSIS — Z91013 Allergy to seafood: Secondary | ICD-10-CM | POA: Insufficient documentation

## 2014-09-01 DIAGNOSIS — F988 Other specified behavioral and emotional disorders with onset usually occurring in childhood and adolescence: Secondary | ICD-10-CM | POA: Diagnosis not present

## 2014-09-01 DIAGNOSIS — Z882 Allergy status to sulfonamides status: Secondary | ICD-10-CM | POA: Insufficient documentation

## 2014-09-01 DIAGNOSIS — N6489 Other specified disorders of breast: Secondary | ICD-10-CM | POA: Diagnosis not present

## 2014-09-01 DIAGNOSIS — Z853 Personal history of malignant neoplasm of breast: Secondary | ICD-10-CM | POA: Insufficient documentation

## 2014-09-01 DIAGNOSIS — J45909 Unspecified asthma, uncomplicated: Secondary | ICD-10-CM | POA: Insufficient documentation

## 2014-09-01 DIAGNOSIS — Z8541 Personal history of malignant neoplasm of cervix uteri: Secondary | ICD-10-CM | POA: Diagnosis not present

## 2014-09-01 DIAGNOSIS — Z91041 Radiographic dye allergy status: Secondary | ICD-10-CM | POA: Insufficient documentation

## 2014-09-01 DIAGNOSIS — T8544XS Capsular contracture of breast implant, sequela: Secondary | ICD-10-CM

## 2014-09-01 DIAGNOSIS — E039 Hypothyroidism, unspecified: Secondary | ICD-10-CM | POA: Diagnosis not present

## 2014-09-01 HISTORY — DX: Microstomia: Q18.5

## 2014-09-01 HISTORY — DX: Other specified symptoms and signs involving the digestive system and abdomen: R19.8

## 2014-09-01 HISTORY — DX: Dental restoration status: Z98.811

## 2014-09-01 HISTORY — DX: Dysphagia, unspecified: R13.10

## 2014-09-01 HISTORY — PX: CAPSULOTOMY: SHX379

## 2014-09-01 HISTORY — PX: LIPOSUCTION WITH LIPOFILLING: SHX6436

## 2014-09-01 HISTORY — DX: Personal history of malignant neoplasm of cervix uteri: Z85.41

## 2014-09-01 HISTORY — DX: Personal history of malignant neoplasm of breast: Z85.3

## 2014-09-01 HISTORY — DX: Personal history of other specified conditions: Z87.898

## 2014-09-01 LAB — POCT HEMOGLOBIN-HEMACUE: Hemoglobin: 15.3 g/dL — ABNORMAL HIGH (ref 12.0–15.0)

## 2014-09-01 SURGERY — CAPSULOTOMY
Anesthesia: General | Site: Breast | Laterality: Left

## 2014-09-01 MED ORDER — MIDAZOLAM HCL 2 MG/2ML IJ SOLN
INTRAMUSCULAR | Status: AC
  Filled 2014-09-01: qty 2

## 2014-09-01 MED ORDER — BUPIVACAINE-EPINEPHRINE (PF) 0.25% -1:200000 IJ SOLN
INTRAMUSCULAR | Status: AC
  Filled 2014-09-01: qty 30

## 2014-09-01 MED ORDER — PROPOFOL 10 MG/ML IV BOLUS
INTRAVENOUS | Status: DC | PRN
  Administered 2014-09-01: 300 mg via INTRAVENOUS

## 2014-09-01 MED ORDER — EPINEPHRINE HCL 1 MG/ML IJ SOLN
INTRAMUSCULAR | Status: AC
Start: 2014-09-01 — End: 2014-09-01
  Filled 2014-09-01: qty 1

## 2014-09-01 MED ORDER — MIDAZOLAM HCL 2 MG/ML PO SYRP: 12.0000 mg | ORAL_SOLUTION | Freq: Once | ORAL | Status: DC | PRN

## 2014-09-01 MED ORDER — DEXAMETHASONE SODIUM PHOSPHATE 4 MG/ML IJ SOLN
INTRAMUSCULAR | Status: DC | PRN
  Administered 2014-09-01: 10 mg via INTRAVENOUS

## 2014-09-01 MED ORDER — BUPIVACAINE-EPINEPHRINE 0.25% -1:200000 IJ SOLN
INTRAMUSCULAR | Status: DC | PRN
  Administered 2014-09-01: 8 mL

## 2014-09-01 MED ORDER — LIDOCAINE HCL 1 % IJ SOLN
INTRAVENOUS | Status: DC | PRN
  Administered 2014-09-01: 08:00:00

## 2014-09-01 MED ORDER — FENTANYL CITRATE 0.05 MG/ML IJ SOLN: 50.0000 ug | INTRAMUSCULAR | Status: DC | PRN

## 2014-09-01 MED ORDER — OXYCODONE HCL 5 MG/5ML PO SOLN: 5.0000 mg | Freq: Once | ORAL | Status: DC | PRN

## 2014-09-01 MED ORDER — MIDAZOLAM HCL 2 MG/2ML IJ SOLN: 1.0000 mg | INTRAMUSCULAR | Status: DC | PRN

## 2014-09-01 MED ORDER — ONDANSETRON HCL 4 MG/2ML IJ SOLN
INTRAMUSCULAR | Status: DC | PRN
  Administered 2014-09-01: 4 mg via INTRAVENOUS

## 2014-09-01 MED ORDER — SUFENTANIL CITRATE 50 MCG/ML IV SOLN
INTRAVENOUS | Status: AC
  Filled 2014-09-01: qty 1

## 2014-09-01 MED ORDER — LACTATED RINGERS IV SOLN
INTRAVENOUS | Status: DC
  Administered 2014-09-01 (×2): via INTRAVENOUS

## 2014-09-01 MED ORDER — LIDOCAINE HCL (CARDIAC) 20 MG/ML IV SOLN
INTRAVENOUS | Status: DC | PRN
  Administered 2014-09-01: 75 mg via INTRAVENOUS

## 2014-09-01 MED ORDER — EPHEDRINE SULFATE 50 MG/ML IJ SOLN
INTRAMUSCULAR | Status: DC | PRN
  Administered 2014-09-01 (×2): 10 mg via INTRAVENOUS

## 2014-09-01 MED ORDER — MIDAZOLAM HCL 5 MG/5ML IJ SOLN
INTRAMUSCULAR | Status: DC | PRN
  Administered 2014-09-01: 2 mg via INTRAVENOUS

## 2014-09-01 MED ORDER — ONDANSETRON HCL 4 MG/2ML IJ SOLN: 4.0000 mg | Freq: Once | INTRAMUSCULAR | Status: DC | PRN

## 2014-09-01 MED ORDER — SUFENTANIL CITRATE 50 MCG/ML IV SOLN
INTRAVENOUS | Status: DC | PRN
  Administered 2014-09-01: 10 ug via INTRAVENOUS

## 2014-09-01 MED ORDER — OXYCODONE HCL 5 MG PO TABS: 5.0000 mg | ORAL_TABLET | Freq: Once | ORAL | Status: DC | PRN

## 2014-09-01 MED ORDER — CIPROFLOXACIN IN D5W 400 MG/200ML IV SOLN
400.0000 mg | INTRAVENOUS | Status: AC
  Administered 2014-09-01: 400 mg via INTRAVENOUS

## 2014-09-01 MED ORDER — LIDOCAINE-EPINEPHRINE 1 %-1:100000 IJ SOLN
INTRAMUSCULAR | Status: AC
  Filled 2014-09-01: qty 1

## 2014-09-01 MED ORDER — CIPROFLOXACIN IN D5W 400 MG/200ML IV SOLN
INTRAVENOUS | Status: AC
  Filled 2014-09-01: qty 200

## 2014-09-01 MED ORDER — HYDROMORPHONE HCL PF 1 MG/ML IJ SOLN: 0.2500 mg | INTRAMUSCULAR | Status: DC | PRN

## 2014-09-01 MED ORDER — LIDOCAINE HCL (PF) 1 % IJ SOLN
INTRAMUSCULAR | Status: AC
  Filled 2014-09-01: qty 60

## 2014-09-01 SURGICAL SUPPLY — 66 items
BAG DECANTER FOR FLEXI CONT (MISCELLANEOUS) ×3 IMPLANT
BINDER BREAST LRG (GAUZE/BANDAGES/DRESSINGS) ×3 IMPLANT
BINDER BREAST MEDIUM (GAUZE/BANDAGES/DRESSINGS) IMPLANT
BINDER BREAST XLRG (GAUZE/BANDAGES/DRESSINGS) IMPLANT
BINDER BREAST XXLRG (GAUZE/BANDAGES/DRESSINGS) IMPLANT
BIOPATCH RED 1 DISK 7.0 (GAUZE/BANDAGES/DRESSINGS) IMPLANT
BLADE HEX COATED 2.75 (ELECTRODE) ×3 IMPLANT
BLADE SURG 15 STRL LF DISP TIS (BLADE) ×2 IMPLANT
BLADE SURG 15 STRL SS (BLADE) ×1
BNDG GAUZE ELAST 4 BULKY (GAUZE/BANDAGES/DRESSINGS) ×6 IMPLANT
CANISTER LIPO FAT HARVEST (MISCELLANEOUS) ×3 IMPLANT
CANISTER SUCT 1200ML W/VALVE (MISCELLANEOUS) ×3 IMPLANT
CHLORAPREP W/TINT 26ML (MISCELLANEOUS) ×3 IMPLANT
CORDS BIPOLAR (ELECTRODE) IMPLANT
COVER MAYO STAND STRL (DRAPES) ×3 IMPLANT
COVER TABLE BACK 60X90 (DRAPES) ×3 IMPLANT
DECANTER SPIKE VIAL GLASS SM (MISCELLANEOUS) IMPLANT
DERMABOND ADVANCED (GAUZE/BANDAGES/DRESSINGS) ×1
DERMABOND ADVANCED .7 DNX12 (GAUZE/BANDAGES/DRESSINGS) ×2 IMPLANT
DRAIN CHANNEL 19F RND (DRAIN) IMPLANT
DRAPE LAPAROSCOPIC ABDOMINAL (DRAPES) ×3 IMPLANT
DRSG PAD ABDOMINAL 8X10 ST (GAUZE/BANDAGES/DRESSINGS) ×9 IMPLANT
ELECT BLADE 4.0 EZ CLEAN MEGAD (MISCELLANEOUS) ×3
ELECT BLADE 6.5 .24CM SHAFT (ELECTRODE) IMPLANT
ELECT REM PT RETURN 9FT ADLT (ELECTROSURGICAL) ×3
ELECTRODE BLDE 4.0 EZ CLN MEGD (MISCELLANEOUS) ×2 IMPLANT
ELECTRODE REM PT RTRN 9FT ADLT (ELECTROSURGICAL) ×2 IMPLANT
EVACUATOR SILICONE 100CC (DRAIN) IMPLANT
FILTER LIPOSUCTION (MISCELLANEOUS) ×3 IMPLANT
GLOVE BIO SURGEON STRL SZ 6.5 (GLOVE) ×9 IMPLANT
GLOVE SURG SS PI 7.0 STRL IVOR (GLOVE) ×3 IMPLANT
GOWN STRL REUS W/ TWL LRG LVL3 (GOWN DISPOSABLE) ×6 IMPLANT
GOWN STRL REUS W/TWL LRG LVL3 (GOWN DISPOSABLE) ×3
IV NS 1000ML (IV SOLUTION)
IV NS 1000ML BAXH (IV SOLUTION) IMPLANT
KIT FILL SYSTEM UNIVERSAL (SET/KITS/TRAYS/PACK) ×3 IMPLANT
LINER CANISTER 1000CC FLEX (MISCELLANEOUS) ×3 IMPLANT
NDL SAFETY ECLIPSE 18X1.5 (NEEDLE) ×2 IMPLANT
NEEDLE HYPO 18GX1.5 SHARP (NEEDLE) ×1
NEEDLE HYPO 25X1 1.5 SAFETY (NEEDLE) ×3 IMPLANT
NEEDLE SPNL 18GX3.5 QUINCKE PK (NEEDLE) ×3 IMPLANT
PACK BASIN DAY SURGERY FS (CUSTOM PROCEDURE TRAY) ×3 IMPLANT
PAD ALCOHOL SWAB (MISCELLANEOUS) ×3 IMPLANT
PENCIL BUTTON HOLSTER BLD 10FT (ELECTRODE) ×3 IMPLANT
PIN SAFETY STERILE (MISCELLANEOUS) IMPLANT
SLEEVE SCD COMPRESS KNEE MED (MISCELLANEOUS) ×3 IMPLANT
SPONGE GAUZE 4X4 12PLY STER LF (GAUZE/BANDAGES/DRESSINGS) IMPLANT
SPONGE LAP 18X18 X RAY DECT (DISPOSABLE) ×3 IMPLANT
SUT MNCRL AB 4-0 PS2 18 (SUTURE) ×6 IMPLANT
SUT MON AB 5-0 PS2 18 (SUTURE) ×3 IMPLANT
SUT PDS 3-0 CT2 (SUTURE)
SUT PDS AB 2-0 CT2 27 (SUTURE) IMPLANT
SUT PDS II 3-0 CT2 27 ABS (SUTURE) IMPLANT
SUT VIC AB 3-0 SH 27 (SUTURE)
SUT VIC AB 3-0 SH 27X BRD (SUTURE) IMPLANT
SUT VICRYL 4-0 PS2 18IN ABS (SUTURE) IMPLANT
SYR 20CC LL (SYRINGE) ×3 IMPLANT
SYR 50ML LL SCALE MARK (SYRINGE) ×3 IMPLANT
SYR BULB IRRIGATION 50ML (SYRINGE) ×3 IMPLANT
SYR CONTROL 10ML LL (SYRINGE) ×3 IMPLANT
SYR TB 1ML LL NO SAFETY (SYRINGE) ×3 IMPLANT
TOWEL OR 17X24 6PK STRL BLUE (TOWEL DISPOSABLE) ×6 IMPLANT
TUBE CONNECTING 20X1/4 (TUBING) IMPLANT
TUBING SET GRADUATE ASPIR 12FT (MISCELLANEOUS) ×3 IMPLANT
UNDERPAD 30X30 INCONTINENT (UNDERPADS AND DIAPERS) ×6 IMPLANT
YANKAUER SUCT BULB TIP NO VENT (SUCTIONS) IMPLANT

## 2014-09-01 NOTE — Brief Op Note (Signed)
09/01/2014  8:53 AM  PATIENT:  Jane Gutierrez  58 y.o. female  PRE-OPERATIVE DIAGNOSIS:  HISTORY OF BREAST CANCER/BREAST ASYMMETRY FOLLOWING BREAST RECONSTRUCTION  POST-OPERATIVE DIAGNOSIS:  HISTORY OF BREAST CANCER/BREAST ASYMMETRY FOLLOWING BREAST RECONSTRUCTION  PROCEDURE:  Procedure(s): CAPSULOTOMY OF LEFT BREAST WITH LIPOFILLING FOR SYMMETRY (Left) LIPOSUCTION WITH LIPOFILLING (Bilateral)  SURGEON:  Surgeon(s) and Role:    * Claire Sanger, DO - Primary  PHYSICIAN ASSISTANT: Shawn Rayburn, PA  ASSISTANTS: none   ANESTHESIA:   local and general  EBL:     BLOOD ADMINISTERED:none  DRAINS: none   LOCAL MEDICATIONS USED:  MARCAINE and LIDOCAINE   SPECIMEN:  No Specimen  DISPOSITION OF SPECIMEN:  N/A  COUNTS:  YES  TOURNIQUET:  * No tourniquets in log *  DICTATION: .Dragon Dictation  PLAN OF CARE: Discharge to home after PACU  PATIENT DISPOSITION:  PACU - hemodynamically stable.   Delay start of Pharmacological VTE agent (>24hrs) due to surgical blood loss or risk of bleeding: no

## 2014-09-01 NOTE — Op Note (Signed)
Op report   DATE OF OPERATION: 09/01/2014  LOCATION: [place]  SURGICAL DIVISION: Plastic Surgery  PREOPERATIVE DIAGNOSES:  1. History of breast cancer.  2. Acquired absence of bilateral breast.  3. Breast asymmetry with left breast capsule contracture. 4. Right breast asymmetry.  POSTOPERATIVE DIAGNOSES:  1. History of breast cancer.  2. Acquired absence of bilateral breast.  3. Breast asymmetry with left breast capsule contracture 4. Right breast asymmetry.   PROCEDURE:  1. Lipofilling bilateral breasts 2. Release of left breast contracture.  SURGEON: Theodoro Kos, DO  ASSISTANT: Shawn Rayburn, PA  ANESTHESIA:  General.   COMPLICATIONS: None.   INDICATIONS FOR PROCEDURE:  The patient, Jane Gutierrez, is a 58 y.o. female born on 1956-08-21, is here for treatment after bilateral mastectomies.  She had tissue expanders placed at the time of mastectomies followed by a left latissimus breast reconstruction and placement of implants. She now presents for revision with symmetry. MRN: [id number]  CONSENT:  Informed consent was obtained directly from the patient. Risks, benefits and alternatives were fully discussed. Specific risks including but not limited to bleeding, infection, hematoma, seroma, scarring, pain, implant infection, implant extrusion, capsular contracture, asymmetry, wound healing problems, and need for further surgery were all discussed. The patient did have an ample opportunity to have her questions answered to her satisfaction.   DESCRIPTION OF PROCEDURE:  The patient was taken to the operating room. SCDs were placed and IV antibiotics were given. The patient's chest was prepped and draped in a sterile fashion. A time out was performed.  Local was used to infiltrate the area and tumescent to inject into the upper central abdominal area.   The old mastectomy scar at the superior portion of the latissimus flap was opened and the excess skin  was excised. Left breast capsule at the site was released.  The soft tissue was closed with 4-0 Monocryl.  Liposuction was done on the abdomen. The fat was prepared and injected into the previous right port site area subcutaneously. The left superior and medial area was injected with fat as well.  The lateral left breast / axilla and inferior mammary fold was liposucted as well. A total of 150 cc of fat was placed. The remaining skin was closed with 5-0 Monocryl subcuticular stitches followed by dermabond.  A breast binder and ABDs were placed.  The patient was allowed to wake from anesthesia and taken to the recovery room in satisfactory condition.

## 2014-09-01 NOTE — Interval H&P Note (Signed)
History and Physical Interval Note:  09/01/2014 7:25 AM  [place]  has presented today for surgery, with the diagnosis of HISTORY OF BREAST CANCER/BREAST ASYMMETRY FOLLOWING BREAST RECONSTRUCTION  The various methods of treatment have been discussed with the patient and family. After consideration of risks, benefits and other options for treatment, the patient has consented to  Procedure(s): CAPSULOTOMY OF LEFT BREAST WITH LIPOFILLING FOR SYMMETRY (Left) LIPOSUCTION WITH LIPOFILLING (Left) as a surgical intervention .  The patient's history has been reviewed, patient examined, no change in status, stable for surgery.  I have reviewed the patient's chart and labs.  Questions were answered to the patient's satisfaction.     SANGER,CLAIRE

## 2014-09-01 NOTE — Anesthesia Preprocedure Evaluation (Signed)
Anesthesia Evaluation  Patient identified by MRN, date of birth, ID band Patient awake    Reviewed: Allergy & Precautions, H&P , NPO status , Patient's Chart, lab work & pertinent test results  Airway Mallampati: II TM Distance: >3 FB Neck ROM: Full    Dental  (+) Teeth Intact, Dental Advisory Given   Pulmonary  breath sounds clear to auscultation        Cardiovascular Rhythm:Regular     Neuro/Psych    GI/Hepatic   Endo/Other    Renal/GU      Musculoskeletal   Abdominal   Peds  Hematology   Anesthesia Other Findings   Reproductive/Obstetrics                           Anesthesia Physical Anesthesia Plan  ASA: II  Anesthesia Plan: General   Post-op Pain Management:    Induction: Intravenous  Airway Management Planned: LMA  Additional Equipment:   Intra-op Plan:   Post-operative Plan:   Informed Consent: I have reviewed the patients History and Physical, chart, labs and discussed the procedure including the risks, benefits and alternatives for the proposed anesthesia with the patient or authorized representative who has indicated his/her understanding and acceptance.   Dental advisory given  Plan Discussed with: CRNA and Anesthesiologist  Anesthesia Plan Comments:         Anesthesia Quick Evaluation

## 2014-09-01 NOTE — Transfer of Care (Signed)
Immediate Anesthesia Transfer of Care Note  Patient: [place]  Procedure(s) Performed: Procedure(s): CAPSULOTOMY OF LEFT BREAST WITH LIPOFILLING FOR SYMMETRY (Left) LIPOSUCTION WITH LIPOFILLING (Bilateral)  Patient Location: PACU  Anesthesia Type:General  Level of Consciousness: awake, alert  and oriented  Airway & Oxygen Therapy: Patient Spontanous Breathing and Patient connected to nasal cannula oxygen  Post-op Assessment: Report given to PACU RN and Post -op Vital signs reviewed and stable  Post vital signs: Reviewed and stable  Complications: No apparent anesthesia complications

## 2014-09-01 NOTE — Anesthesia Procedure Notes (Signed)
Procedure Name: LMA Insertion Date/Time: 09/01/2014 7:40 AM Performed by: Melynda Ripple D Pre-anesthesia Checklist: Patient identified, Emergency Drugs available, Suction available and Patient being monitored Patient Re-evaluated:Patient Re-evaluated prior to inductionOxygen Delivery Method: Circle System Utilized Preoxygenation: Pre-oxygenation with 100% oxygen Intubation Type: IV induction Ventilation: Mask ventilation without difficulty LMA: LMA inserted LMA Size: 4.0 Number of attempts: 1 Airway Equipment and Method: bite block Placement Confirmation: positive ETCO2 Tube secured with: Tape Dental Injury: Teeth and Oropharynx as per pre-operative assessment

## 2014-09-01 NOTE — Discharge Instructions (Signed)
May shower on Saturday Continue spanx, sports bra / binder  Call your surgeon if you experience:   1.  Fever over 101.0. 2.  Inability to urinate. 3.  Nausea and/or vomiting. 4.  Extreme swelling or bruising at the surgical site. 5.  Continued bleeding from the incision. 6.  Increased pain, redness or drainage from the incision. 7.  Problems related to your pain medication. 8. Any change in color, movement and/or sensation 9. Any problems and/or concerns     Post Anesthesia Home Care Instructions  Activity: Get plenty of rest for the remainder of the day. A responsible adult should stay with you for 24 hours following the procedure.  For the next 24 hours, DO NOT: -Drive a car -Paediatric nurse -Drink alcoholic beverages -Take any medication unless instructed by your physician -Make any legal decisions or sign important papers.  Meals: Start with liquid foods such as gelatin or soup. Progress to regular foods as tolerated. Avoid greasy, spicy, heavy foods. If nausea and/or vomiting occur, drink only clear liquids until the nausea and/or vomiting subsides. Call your physician if vomiting continues.  Special Instructions/Symptoms: Your throat may feel dry or sore from the anesthesia or the breathing tube placed in your throat during surgery. If this causes discomfort, gargle with warm salt water. The discomfort should disappear within 24 hours.

## 2014-09-01 NOTE — H&P (View-Only) (Signed)
Jane Gutierrez is an 58 y.o. female.   Chief Complaint: capsular contracture of left breast HPI: The patient is a 58 yrs old wf here for follow up on her bilateral breast reconstruction. She had a latissimus flap with expander placement on the left and implant placement on the left after bilateral mastectomies. She now has mild asymmetry. She is otherwise doing very well but would like to be better so she can fit into one bra without padding.  Past Medical History  Diagnosis Date  . IBS (irritable bowel syndrome)   . ADD (attention deficit disorder)   . Hypothyroidism   . History of breast cancer   . History of cervical cancer   . History of palpitations     last echo 08/22/2014  . Abnormally small mouth   . Dental crowns present   . Difficulty swallowing pills   . Asthma     prn inhaler    Past Surgical History  Procedure Laterality Date  . Brain surgery      removal benign frontal lobe cyst in 1987  . Foot surgery Bilateral 07/2012  . Gynecologic cryosurgery  1998  . Breast enhancement surgery Bilateral ~ 2008  . Facial cosmetic surgery  ~ 2010  . Axillary sentinel node biopsy  12/31/2012    Procedure: AXILLARY SENTINEL NODE BIOPSY;  Surgeon: Rolm Bookbinder, MD;  Location: [place];  Service: General;  Laterality: Bilateral;  bilateral sentinel node  . Total mastectomy  12/31/2012    Procedure: TOTAL MASTECTOMY;  Surgeon: Rolm Bookbinder, MD;  Location: [place];  Service: General;  Laterality: Bilateral;  bilateral total mastectomies  . Breast reconstruction with placement of tissue expander and flex hd (acellular hydrated dermis)  12/31/2012    Procedure: BREAST RECONSTRUCTION WITH PLACEMENT OF TISSUE EXPANDER AND FLEX HD (ACELLULAR HYDRATED DERMIS);  Surgeon: Theodoro Kos, DO;  Location: [place];  Service: Plastics;  Laterality: Bilateral;  bilateral immediate breast reconstruction with expanders and flex hd   . Portacath placement   01/27/2013    Procedure: INSERTION PORT-A-CATH;  Surgeon: Rolm Bookbinder, MD;  Location: [place];  Service: General;  Laterality: Right;  . Incision and drainage of wound Left 04/28/2013    Procedure: IRRIGATION AND DEBRIDEMENT LEFT BREAST WOUND, POSSIBLE CLOSURE OF WOUND WITH DRAIN PLACEMENT;  Surgeon: Theodoro Kos, DO;  Location: [place];  Service: Plastics;  Laterality: Left;  . Tee without cardioversion N/A 06/04/2013    Procedure: TRANSESOPHAGEAL ECHOCARDIOGRAM (TEE);  Surgeon: Jolaine Artist, MD;  Location: [facility] ENDOSCOPY;  Service: Cardiovascular;  Laterality: N/A;  . Tissue expander placement Left 06/16/2013    Procedure: LEFT BREAST TISSUE EXPANDER REMOVAL;  Surgeon: Theodoro Kos, DO;  Location: [place];  Service: Plastics;  Laterality: Left;  . Dilation and curettage of uterus  2005  . Cervical cerclage    . Latissimus flap to breast Left 09/29/2013    Procedure: LATISSIMUS FLAP TO BREAST WITH TISSUE EXPANDER PLACEMENT FOR LEFT BREAST RECONSTRUCTION;  Surgeon: Theodoro Kos, DO;  Location: [place];  Service: Plastics;  Laterality: Left;  . Tissue expander placement Left 09/29/2013    Procedure: TISSUE EXPANDER;  Surgeon: Theodoro Kos, DO;  Location: [place];  Service: Plastics;  Laterality: Left;  . Removal of bilateral tissue expanders with placement of bilateral breast implants Bilateral 03/09/2014    Procedure: REMOVAL OF BILATERAL TISSUE EXPANDERS WITH PLACEMENT OF BILATERAL BREAST IMPLANTS;  Surgeon: Theodoro Kos, DO;  Location:  [place];  Service: Plastics;  Laterality: Bilateral;  . Liposuction Bilateral 03/09/2014    Procedure: LIPOSUCTION ABDOMEN W/ LIPOFILLING LATERAL BILATERAL BREAST;  Surgeon: Theodoro Kos, DO;  Location: [place];  Service: Plastics;  Laterality: Bilateral;  LIPOSUCTION NECK  . Port-a-cath removal Right 03/09/2014    Procedure: REMOVAL PORT-A-CATH;  Surgeon: Theodoro Kos,  DO;  Location: [place];  Service: Plastics;  Laterality: Right;  . Total abdominal hysterectomy w/ bilateral salpingoophorectomy  03/28/2004  . Lysis of adhesion  03/28/2004    pelvic  . Abdominoplasty  03/28/2004  . Lipectomy Bilateral 03/28/2004    hip  . Endometrial fulguration  05/12/2003  . Radiofrequency ablation nerves  05/12/2003    uterosacral nerve ablation  . Diagnostic laparoscopy  05/12/2003    right partial salpingectomy; lysis paraovarian adhesions left    Family History  Problem Relation Age of Onset  . Cancer Mother 20    uterine, mult myeloma  . CVA Father   . Cancer Sister 74    breast  . Cancer Maternal Grandmother 60    breast   Social History:  reports that she has never smoked. She has never used smokeless tobacco. She reports that she drinks alcohol. She reports that she does not use illicit drugs.  Allergies:  Allergies  Allergen Reactions  . Betadine [Povidone Iodine] Anaphylaxis  . Iodine Anaphylaxis  . Shellfish-Derived Products Anaphylaxis  . Cleocin [Clindamycin Hcl] Rash  . Ginger Rash  . Penicillins Rash  . Sulfamethoxazole-Trimethoprim Rash     (Not in a hospital admission)  No results found for this or any previous visit (from the past 48 hour(s)). No results found.  Review of Systems  Constitutional: Negative.   HENT: Negative.   Eyes: Negative.   Respiratory: Negative.   Cardiovascular: Negative.   Gastrointestinal: Negative.   Genitourinary: Negative.   Musculoskeletal: Negative.   Skin: Negative.   Neurological: Negative.   Psychiatric/Behavioral: Negative.     There were no vitals taken for this visit. Physical Exam  Constitutional: She is oriented to person, place, and time. She appears well-developed and well-nourished.  HENT:  Head: Normocephalic and atraumatic.  Eyes: Conjunctivae and EOM are normal. Pupils are equal, round, and reactive to light.  Respiratory: Effort normal.  GI: Soft.  Neurological:  She is alert and oriented to person, place, and time.  Skin: Skin is warm.  Psychiatric: She has a normal mood and affect. Her behavior is normal. Judgment and thought content normal.     Assessment/Plan Capsule contracture release of left breast with lipofilling for symmetry  [facility] 08/30/2014, 11:37 AM

## 2014-09-01 NOTE — Anesthesia Postprocedure Evaluation (Signed)
  Anesthesia Post-op Note  Patient: Jane Gutierrez  Procedure(s) Performed: Procedure(s): CAPSULOTOMY OF LEFT BREAST WITH LIPOFILLING FOR SYMMETRY (Left) LIPOSUCTION WITH LIPOFILLING (Bilateral)  Patient Location: PACU  Anesthesia Type:General  Level of Consciousness: awake, alert  and oriented  Airway and Oxygen Therapy: Patient Spontanous Breathing and Patient connected to nasal cannula oxygen  Post-op Pain: mild  Post-op Assessment: Post-op Vital signs reviewed, Patient's Cardiovascular Status Stable, Respiratory Function Stable, Patent Airway and Pain level controlled  Post-op Vital Signs: stable  Last Vitals:  Filed Vitals:   09/01/14 0930  BP: 126/70  Pulse: 70  Temp:   Resp: 16    Complications: No apparent anesthesia complications

## 2014-09-02 ENCOUNTER — Encounter (HOSPITAL_BASED_OUTPATIENT_CLINIC_OR_DEPARTMENT_OTHER): Payer: Self-pay | Admitting: Plastic Surgery

## 2014-09-02 ENCOUNTER — Telehealth (HOSPITAL_COMMUNITY): Payer: Self-pay | Admitting: *Deleted

## 2014-09-02 NOTE — Telephone Encounter (Signed)
Called pt with echo and monitor results, SR with occasional PAC/PVCs no sustained arrhythmia, she is aware

## 2014-09-08 ENCOUNTER — Ambulatory Visit (INDEPENDENT_AMBULATORY_CARE_PROVIDER_SITE_OTHER): Payer: 59 | Admitting: Podiatry

## 2014-09-08 ENCOUNTER — Ambulatory Visit (INDEPENDENT_AMBULATORY_CARE_PROVIDER_SITE_OTHER): Payer: 59

## 2014-09-08 ENCOUNTER — Encounter: Payer: Self-pay | Admitting: Podiatry

## 2014-09-08 VITALS — BP 129/75 | HR 76 | Resp 14 | Ht 64.5 in | Wt 150.0 lb

## 2014-09-08 DIAGNOSIS — M2040 Other hammer toe(s) (acquired), unspecified foot: Secondary | ICD-10-CM

## 2014-09-08 NOTE — Progress Notes (Signed)
   Subjective:    Patient ID: Jane Gutierrez, female    DOB: 1956/01/05, 58 y.o.   MRN: [id number]  HPI Comments: Pt states she had foot surgery 2 years ago, right foot now has 4,5th toes that roll under when in pointed shoes.     Review of Systems  All other systems reviewed and are negative.      Objective:   Physical Exam        Assessment & Plan:

## 2014-09-08 NOTE — Progress Notes (Signed)
Subjective:     Patient ID: Lenora Boys, female   DOB: Jul 10, 1956, 58 y.o.   MRN: [id number]  HPI I'm concerned because the fourth toe on my right foot has been bothering me some when I wear certain shoes and the fifth toe seems to kind of fall out to the side   Review of Systems     Objective:   Physical Exam Neurovascular status intact with well-healed surgical site fourth interspace right with moderate excessive movement of the fifth toe right    Assessment:     Moderate instability of fifth toe secondary to bone resection and mild elevation of the fourth toe right    Plan:     H&P and x-ray reviewed and discussed careful placement of the fifth toe in shoes. I do not see anything else at this point to help her but I did advise her if the fourth toe started to give her trouble eventually we could straighten it

## 2014-09-15 ENCOUNTER — Ambulatory Visit (INDEPENDENT_AMBULATORY_CARE_PROVIDER_SITE_OTHER): Payer: 59 | Admitting: Family Medicine

## 2014-09-15 ENCOUNTER — Encounter: Payer: Self-pay | Admitting: Family Medicine

## 2014-09-15 VITALS — BP 112/73 | Ht 65.0 in | Wt 150.0 lb

## 2014-09-15 DIAGNOSIS — M25559 Pain in unspecified hip: Secondary | ICD-10-CM

## 2014-09-15 DIAGNOSIS — M25551 Pain in right hip: Secondary | ICD-10-CM

## 2014-09-15 DIAGNOSIS — M25561 Pain in right knee: Secondary | ICD-10-CM

## 2014-09-15 DIAGNOSIS — M25569 Pain in unspecified knee: Secondary | ICD-10-CM

## 2014-09-15 NOTE — Patient Instructions (Signed)
You have hip external rotator spasms/strain and SI joint dysfunction. Your knee pain is likely due to mild arthritis. Start physical therapy for these issues and do home exercises on days you don't go for next 6 weeks. Medications that can me used for arthritis: Take tylenol 500mg  1-2 tabs three times a day for pain. Aleve 1-2 tabs twice a day with food Capsaicin topically up to four times a day may also help with pain. Cortisone injections are an option. It's important that you continue to stay active. Shoe inserts with good arch support may be helpful (sports insoles or dr. Zoe Lan active series insoles). Heat or ice 15 minutes at a time 3-4 times a day as needed to help with pain. Water aerobics and cycling with low resistance are the best two types of exercise for arthritis. Follow up with me in 5-6 weeks.

## 2014-09-16 ENCOUNTER — Encounter: Payer: Self-pay | Admitting: Family Medicine

## 2014-09-16 NOTE — Progress Notes (Signed)
Patient ID: Jane Gutierrez, female   DOB: 02-21-1956, 58 y.o.   MRN: [id number]  PCP: Sheela Stack, MD  Subjective:   HPI: Patient is a 58 y.o. female here for right knee, hip pain.  Patient reports she's recently been trying to get back into running. Recalls a couple days ago doing 2:1 YTK:ZSWF - on third interval she felt pain right side of hip to knee. Some mild swelling. No catching, locking, giving out. Hip hurts more than her knee (4/10 level vs 2/10). Had lateral hip injection (likely trochanteric bursa) in July. No bowel/bladder dysfunction. Pain doesn't radiate past her knee. No numbness/tingling.  Past Medical History  Diagnosis Date  . IBS (irritable bowel syndrome)   . ADD (attention deficit disorder)   . Hypothyroidism   . History of breast cancer   . History of cervical cancer   . History of palpitations     last echo 08/22/2014  . Abnormally small mouth   . Dental crowns present   . Difficulty swallowing pills   . Asthma     prn inhaler  . Blood transfusion without reported diagnosis     Current Outpatient Prescriptions on File Prior to Visit  Medication Sig Dispense Refill  . aspirin 81 MG tablet Take 81 mg by mouth daily.      . Multiple Vitamin (MULTIVITAMIN WITH MINERALS) TABS Take 1 tablet by mouth daily.      [name] SYNTHROID 100 MCG tablet TAKE 1 TABLET BY MOUTH DAILY.  30 tablet  4  . vitamin E (VITAMIN E) 400 UNIT capsule Take 800 Units by mouth daily.      . B Complex-C (B-COMPLEX WITH VITAMIN C) tablet Take 1 tablet by mouth daily.      [name] dextroamphetamine (DEXTROSTAT) 5 MG tablet Take 2.5 mg by mouth daily.      [name] EPINEPHrine (EPIPEN) 0.3 mg/0.3 mL DEVI Inject 0.3 mg into the muscle as needed (for allergic reaction).       . Eszopiclone 3 MG TABS Take 1 tablet (3 mg total) by mouth at bedtime. Take immediately before bedtime  30 tablet  3  . hyoscyamine (ANASPAZ) 0.125 MG TBDP disintergrating tablet Place 0.125 mg under the tongue.      [name] LORazepam  (ATIVAN) 1 MG tablet TAKE 1 TABLET BY MOUTH EVERY 8 HOURS AS NEEDED FOR ANXIETY  60 tablet  0  . vitamin C (ASCORBIC ACID) 500 MG tablet Take 500 mg by mouth daily.       No current facility-administered medications on file prior to visit.    Past Surgical History  Procedure Laterality Date  . Brain surgery      removal benign frontal lobe cyst in 1987  . Foot surgery Bilateral 07/2012  . Gynecologic cryosurgery  1998  . Breast enhancement surgery Bilateral ~ 2008  . Facial cosmetic surgery  ~ 2010  . Axillary sentinel node biopsy  12/31/2012    Procedure: AXILLARY SENTINEL NODE BIOPSY;  Surgeon: Rolm Bookbinder, MD;  Location: [place];  Service: General;  Laterality: Bilateral;  bilateral sentinel node  . Total mastectomy  12/31/2012    Procedure: TOTAL MASTECTOMY;  Surgeon: Rolm Bookbinder, MD;  Location: [place];  Service: General;  Laterality: Bilateral;  bilateral total mastectomies  . Breast reconstruction with placement of tissue expander and flex hd (acellular hydrated dermis)  12/31/2012    Procedure: BREAST RECONSTRUCTION WITH PLACEMENT OF TISSUE EXPANDER AND FLEX HD (ACELLULAR HYDRATED DERMIS);  Surgeon:  Claire Sanger, DO;  Location: [place];  Service: Plastics;  Laterality: Bilateral;  bilateral immediate breast reconstruction with expanders and flex hd   . Portacath placement  01/27/2013    Procedure: INSERTION PORT-A-CATH;  Surgeon: Rolm Bookbinder, MD;  Location: [place];  Service: General;  Laterality: Right;  . Incision and drainage of wound Left 04/28/2013    Procedure: IRRIGATION AND DEBRIDEMENT LEFT BREAST WOUND, POSSIBLE CLOSURE OF WOUND WITH DRAIN PLACEMENT;  Surgeon: Theodoro Kos, DO;  Location: [place];  Service: Plastics;  Laterality: Left;  . Tee without cardioversion N/A 06/04/2013    Procedure: TRANSESOPHAGEAL ECHOCARDIOGRAM (TEE);  Surgeon: Jolaine Artist, MD;  Location: [facility]  ENDOSCOPY;  Service: Cardiovascular;  Laterality: N/A;  . Tissue expander placement Left 06/16/2013    Procedure: LEFT BREAST TISSUE EXPANDER REMOVAL;  Surgeon: Theodoro Kos, DO;  Location: [place];  Service: Plastics;  Laterality: Left;  . Dilation and curettage of uterus  2005  . Cervical cerclage    . Latissimus flap to breast Left 09/29/2013    Procedure: LATISSIMUS FLAP TO BREAST WITH TISSUE EXPANDER PLACEMENT FOR LEFT BREAST RECONSTRUCTION;  Surgeon: Theodoro Kos, DO;  Location: [place];  Service: Plastics;  Laterality: Left;  . Tissue expander placement Left 09/29/2013    Procedure: TISSUE EXPANDER;  Surgeon: Theodoro Kos, DO;  Location: [place];  Service: Plastics;  Laterality: Left;  . Removal of bilateral tissue expanders with placement of bilateral breast implants Bilateral 03/09/2014    Procedure: REMOVAL OF BILATERAL TISSUE EXPANDERS WITH PLACEMENT OF BILATERAL BREAST IMPLANTS;  Surgeon: Theodoro Kos, DO;  Location: [place];  Service: Plastics;  Laterality: Bilateral;  . Liposuction Bilateral 03/09/2014    Procedure: LIPOSUCTION ABDOMEN W/ LIPOFILLING LATERAL BILATERAL BREAST;  Surgeon: Theodoro Kos, DO;  Location: [place];  Service: Plastics;  Laterality: Bilateral;  LIPOSUCTION NECK  . Port-a-cath removal Right 03/09/2014    Procedure: REMOVAL PORT-A-CATH;  Surgeon: Theodoro Kos, DO;  Location: [place];  Service: Plastics;  Laterality: Right;  . Total abdominal hysterectomy w/ bilateral salpingoophorectomy  03/28/2004  . Lysis of adhesion  03/28/2004    pelvic  . Abdominoplasty  03/28/2004  . Lipectomy Bilateral 03/28/2004    hip  . Endometrial fulguration  05/12/2003  . Radiofrequency ablation nerves  05/12/2003    uterosacral nerve ablation  . Diagnostic laparoscopy  05/12/2003    right partial salpingectomy; lysis paraovarian adhesions left  . Capsulotomy Left 09/01/2014    Procedure: CAPSULOTOMY OF LEFT BREAST WITH  LIPOFILLING FOR SYMMETRY;  Surgeon: Theodoro Kos, DO;  Location: [place];  Service: Plastics;  Laterality: Left;  . Liposuction with lipofilling Bilateral 09/01/2014    Procedure: LIPOSUCTION WITH LIPOFILLING;  Surgeon: Theodoro Kos, DO;  Location: [place];  Service: Plastics;  Laterality: Bilateral;    Allergies  Allergen Reactions  . Betadine [Povidone Iodine] Anaphylaxis  . Iodine Anaphylaxis  . Shellfish-Derived Products Anaphylaxis  . Cleocin [Clindamycin Hcl] Rash  . Ginger Rash  . Penicillins Rash  . Sulfamethoxazole-Trimethoprim Rash    History   Social History  . Marital Status: Widowed    Spouse Name: N/A    Number of Children: N/A  . Years of Education: N/A   Occupational History  . Not on file.   Social History Main Topics  . Smoking status: Never Smoker   . Smokeless tobacco: Never Used  . Alcohol Use: Yes  Comment: occasionally  . Drug Use: No  . Sexual Activity: Yes   Other Topics Concern  . Not on file   Social History Narrative  . No narrative on file    Family History  Problem Relation Age of Onset  . Cancer Mother 57    uterine, mult myeloma  . CVA Father   . Cancer Sister 69    breast  . Cancer Maternal Grandmother 20    breast    BP 112/73  Ht 5\' 5"  (1.651 m)  Wt 150 lb (68.04 kg)  BMI 24.96 kg/m2  Review of Systems: See HPI above.    Objective:  Physical Exam:  Gen: NAD  Back/R hip: No gross deformity, scoliosis. TTP right SI joint and within hip external rotators/glutes.  No midline or bony TTP.  No trochanter tenderness. FROM.  No pain with back, hip passive motion. Strength LEs 5/5 all muscle groups except 5-/5 hip abduction with minimal pain.   2+ MSRs in patellar and achilles tendons, equal bilaterally. Negative SLRs. Sensation intact to light touch bilaterally. Negative logroll bilateral hips Positive fabers on right, negative left.  Negative piriformis stretches.  R  knee: No gross deformity, ecchymoses, swelling.  1+ crepitation. Minimal tenderness joint lines. FROM. Negative ant/post drawers. Negative valgus/varus testing. Negative lachmanns. Negative mcmurrays, apleys, patellar apprehension. NV intact distally.    Assessment & Plan:  1. Right hip pain - 2/2 hip external rotator spasms/strain and SI joint dysfunction.  Start physical therapy and home exercises/stretches - these are integral to improvement from these conditions.  Discussed tylenol, nsaids, capsaicin.  F/u in 5-6 weeks.  2. Right knee pain - exam reassuring.  Likely due to mild DJD.  Discussed tylenol, nsaids, capsaicin.  Consider intraarticular cortisone injection in future.  Heat or ice, arch support.  F/u in 5-6 weeks.

## 2014-09-19 DIAGNOSIS — M25551 Pain in right hip: Secondary | ICD-10-CM | POA: Insufficient documentation

## 2014-09-19 DIAGNOSIS — M25561 Pain in right knee: Secondary | ICD-10-CM | POA: Insufficient documentation

## 2014-09-19 NOTE — Assessment & Plan Note (Signed)
2/2 hip external rotator spasms/strain and SI joint dysfunction.  Start physical therapy and home exercises/stretches - these are integral to improvement from these conditions.  Discussed tylenol, nsaids, capsaicin.  F/u in 5-6 weeks.

## 2014-09-19 NOTE — Assessment & Plan Note (Signed)
exam reassuring.  Likely due to mild DJD.  Discussed tylenol, nsaids, capsaicin.  Consider intraarticular cortisone injection in future.  Heat or ice, arch support.  F/u in 5-6 weeks.

## 2014-09-26 ENCOUNTER — Ambulatory Visit (HOSPITAL_COMMUNITY)
Admission: RE | Admit: 2014-09-26 | Discharge: 2014-09-26 | Disposition: A | Discharge status: Home / Self Care | Payer: 59 | Source: Ambulatory Visit | Attending: Adult Health | Admitting: Adult Health

## 2014-09-26 DIAGNOSIS — E041 Nontoxic single thyroid nodule: Secondary | ICD-10-CM | POA: Diagnosis not present

## 2014-09-28 ENCOUNTER — Telehealth: Payer: Self-pay | Admitting: Hematology and Oncology

## 2014-09-28 ENCOUNTER — Encounter: Payer: Self-pay | Admitting: Hematology and Oncology

## 2014-09-28 NOTE — Telephone Encounter (Signed)
, °

## 2014-09-28 NOTE — Progress Notes (Signed)
Insurance was paying herceptin.

## 2014-09-30 ENCOUNTER — Telehealth: Payer: Self-pay | Admitting: *Deleted

## 2014-09-30 NOTE — Telephone Encounter (Signed)
Called to inform pt of results concerning thyroid ultrasound. No answer but left a detailed message that thyroid is stable and did not show any significant abnormalities. If she has any questions about this I told her to call this nurse back @ [phone]. Message to be forwarded to [place].

## 2014-10-03 ENCOUNTER — Ambulatory Visit: Payer: 59 | Attending: Family Medicine | Admitting: Physical Therapy

## 2014-10-03 DIAGNOSIS — R293 Abnormal posture: Secondary | ICD-10-CM | POA: Diagnosis not present

## 2014-10-03 DIAGNOSIS — Z853 Personal history of malignant neoplasm of breast: Secondary | ICD-10-CM | POA: Insufficient documentation

## 2014-10-03 DIAGNOSIS — M25551 Pain in right hip: Secondary | ICD-10-CM | POA: Diagnosis not present

## 2014-10-03 DIAGNOSIS — R269 Unspecified abnormalities of gait and mobility: Secondary | ICD-10-CM | POA: Insufficient documentation

## 2014-10-03 DIAGNOSIS — M25561 Pain in right knee: Secondary | ICD-10-CM | POA: Insufficient documentation

## 2014-10-03 DIAGNOSIS — Z5189 Encounter for other specified aftercare: Secondary | ICD-10-CM | POA: Diagnosis not present

## 2014-10-06 ENCOUNTER — Ambulatory Visit: Payer: 59 | Admitting: Physical Therapy

## 2014-10-06 DIAGNOSIS — Z5189 Encounter for other specified aftercare: Secondary | ICD-10-CM | POA: Diagnosis not present

## 2014-10-10 ENCOUNTER — Encounter (HOSPITAL_COMMUNITY): Payer: 59

## 2014-10-11 ENCOUNTER — Ambulatory Visit: Payer: 59 | Admitting: Physical Therapy

## 2014-10-11 DIAGNOSIS — Z5189 Encounter for other specified aftercare: Secondary | ICD-10-CM | POA: Diagnosis not present

## 2014-10-13 ENCOUNTER — Ambulatory Visit: Payer: 59 | Admitting: Physical Therapy

## 2014-10-17 ENCOUNTER — Ambulatory Visit: Payer: 59 | Admitting: Physical Therapy

## 2014-10-17 DIAGNOSIS — Z5189 Encounter for other specified aftercare: Secondary | ICD-10-CM | POA: Diagnosis not present

## 2014-10-19 ENCOUNTER — Other Ambulatory Visit: Payer: Self-pay | Admitting: Adult Health

## 2014-10-21 ENCOUNTER — Ambulatory Visit: Payer: 59 | Admitting: Physical Therapy

## 2014-10-21 DIAGNOSIS — Z5189 Encounter for other specified aftercare: Secondary | ICD-10-CM | POA: Diagnosis not present

## 2014-10-24 ENCOUNTER — Telehealth: Payer: Self-pay | Admitting: *Deleted

## 2014-10-24 ENCOUNTER — Ambulatory Visit: Payer: 59 | Attending: Family Medicine | Admitting: Physical Therapy

## 2014-10-24 DIAGNOSIS — Z853 Personal history of malignant neoplasm of breast: Secondary | ICD-10-CM | POA: Diagnosis not present

## 2014-10-24 DIAGNOSIS — R269 Unspecified abnormalities of gait and mobility: Secondary | ICD-10-CM | POA: Diagnosis not present

## 2014-10-24 DIAGNOSIS — M25551 Pain in right hip: Secondary | ICD-10-CM | POA: Insufficient documentation

## 2014-10-24 DIAGNOSIS — Z5189 Encounter for other specified aftercare: Secondary | ICD-10-CM | POA: Insufficient documentation

## 2014-10-24 DIAGNOSIS — M25561 Pain in right knee: Secondary | ICD-10-CM | POA: Diagnosis not present

## 2014-10-24 DIAGNOSIS — R293 Abnormal posture: Secondary | ICD-10-CM | POA: Insufficient documentation

## 2014-10-24 NOTE — Telephone Encounter (Signed)
sw the pt informed her that Jane Gutierrez wanted to decrease her appts to once a week. Pt chose to keep her appt schedule for 11/01/14 @ 9:30am. Pt is aware of her appt...Marland KitchenMarland Kitchentd

## 2014-10-24 NOTE — Telephone Encounter (Signed)
Hello wonderful office staff. I need to reduce the visits of [facility] for 1x a week from 2 x a week.  Please do not D/C. Thanks  Jane Gutierrez

## 2014-10-24 NOTE — Telephone Encounter (Signed)
per Donnetta Simpers verbally to d/c all  the pt future appts....td

## 2014-10-24 NOTE — Patient Instructions (Addendum)
    Copyright  VHI. All rights reserved.  Supine: Leg Stretch With Strap (Advanced)   Lie on back with one knee bent, foot flat on floor. Hook strap around other foot. Straighten knee. Raise leg to maximal stretch and straighten knee further by tightening quadriceps muscle. Hold 30 to 60___ seconds. Warning: Intense stretch. Stay within tolerance.  Repeat __3_ times per session. Do 1-2___ sessions per day.  Copyright  VHI. All rights reserved.

## 2014-10-24 NOTE — Therapy (Addendum)
Physical Therapy Treatment/Discharge Note  Patient Details  Name: Jane Gutierrez MRN: [id number] Date of Birth: 1956/06/30  Encounter Date: 10/24/2014      PT End of Session - 10/24/14 1636    Visit Number 7   Number of Visits 12   Date for PT Re-Evaluation 11/18/14   PT Start Time 9201   PT Stop Time 1648   PT Time Calculation (min) 61 min      Past Medical History  Diagnosis Date  . IBS (irritable bowel syndrome)   . ADD (attention deficit disorder)   . Hypothyroidism   . History of breast cancer   . History of cervical cancer   . History of palpitations     last echo 08/22/2014  . Abnormally small mouth   . Dental crowns present   . Difficulty swallowing pills   . Asthma     prn inhaler  . Blood transfusion without reported diagnosis     Past Surgical History  Procedure Laterality Date  . Brain surgery      removal benign frontal lobe cyst in 1987  . Foot surgery Bilateral 07/2012  . Gynecologic cryosurgery  1998  . Breast enhancement surgery Bilateral ~ 2008  . Facial cosmetic surgery  ~ 2010  . Axillary sentinel node biopsy  12/31/2012    Procedure: AXILLARY SENTINEL NODE BIOPSY;  Surgeon: Rolm Bookbinder, MD;  Location: [place];  Service: General;  Laterality: Bilateral;  bilateral sentinel node  . Total mastectomy  12/31/2012    Procedure: TOTAL MASTECTOMY;  Surgeon: Rolm Bookbinder, MD;  Location: [place];  Service: General;  Laterality: Bilateral;  bilateral total mastectomies  . Breast reconstruction with placement of tissue expander and flex hd (acellular hydrated dermis)  12/31/2012    Procedure: BREAST RECONSTRUCTION WITH PLACEMENT OF TISSUE EXPANDER AND FLEX HD (ACELLULAR HYDRATED DERMIS);  Surgeon: Theodoro Kos, DO;  Location: [place];  Service: Plastics;  Laterality: Bilateral;  bilateral immediate breast reconstruction with expanders and flex hd   . Portacath placement  01/27/2013    Procedure: INSERTION  PORT-A-CATH;  Surgeon: Rolm Bookbinder, MD;  Location: [place];  Service: General;  Laterality: Right;  . Incision and drainage of wound Left 04/28/2013    Procedure: IRRIGATION AND DEBRIDEMENT LEFT BREAST WOUND, POSSIBLE CLOSURE OF WOUND WITH DRAIN PLACEMENT;  Surgeon: Theodoro Kos, DO;  Location: [place];  Service: Plastics;  Laterality: Left;  . Tee without cardioversion N/A 06/04/2013    Procedure: TRANSESOPHAGEAL ECHOCARDIOGRAM (TEE);  Surgeon: Jolaine Artist, MD;  Location: [facility] ENDOSCOPY;  Service: Cardiovascular;  Laterality: N/A;  . Tissue expander placement Left 06/16/2013    Procedure: LEFT BREAST TISSUE EXPANDER REMOVAL;  Surgeon: Theodoro Kos, DO;  Location: [place];  Service: Plastics;  Laterality: Left;  . Dilation and curettage of uterus  2005  . Cervical cerclage    . Latissimus flap to breast Left 09/29/2013    Procedure: LATISSIMUS FLAP TO BREAST WITH TISSUE EXPANDER PLACEMENT FOR LEFT BREAST RECONSTRUCTION;  Surgeon: Theodoro Kos, DO;  Location: [place];  Service: Plastics;  Laterality: Left;  . Tissue expander placement Left 09/29/2013    Procedure: TISSUE EXPANDER;  Surgeon: Theodoro Kos, DO;  Location: [place];  Service: Plastics;  Laterality: Left;  . Removal of bilateral tissue expanders with placement of bilateral breast implants Bilateral 03/09/2014    Procedure: REMOVAL OF BILATERAL TISSUE EXPANDERS WITH PLACEMENT OF BILATERAL BREAST IMPLANTS;  Surgeon: Lyndee Leo  Sanger, DO;  Location: [place];  Service: Clinical cytogeneticist;  Laterality: Bilateral;  . Liposuction Bilateral 03/09/2014    Procedure: LIPOSUCTION ABDOMEN W/ LIPOFILLING LATERAL BILATERAL BREAST;  Surgeon: Theodoro Kos, DO;  Location: [place];  Service: Plastics;  Laterality: Bilateral;  LIPOSUCTION NECK  . Port-a-cath removal Right 03/09/2014    Procedure: REMOVAL PORT-A-CATH;  Surgeon: Theodoro Kos, DO;  Location: [place];  Service: Plastics;  Laterality: Right;  . Total abdominal hysterectomy w/ bilateral salpingoophorectomy  03/28/2004  . Lysis of adhesion  03/28/2004    pelvic  . Abdominoplasty  03/28/2004  . Lipectomy Bilateral 03/28/2004    hip  . Endometrial fulguration  05/12/2003  . Radiofrequency ablation nerves  05/12/2003    uterosacral nerve ablation  . Diagnostic laparoscopy  05/12/2003    right partial salpingectomy; lysis paraovarian adhesions left  . Capsulotomy Left 09/01/2014    Procedure: CAPSULOTOMY OF LEFT BREAST WITH LIPOFILLING FOR SYMMETRY;  Surgeon: Theodoro Kos, DO;  Location: [place];  Service: Plastics;  Laterality: Left;  . Liposuction with lipofilling Bilateral 09/01/2014    Procedure: LIPOSUCTION WITH LIPOFILLING;  Surgeon: Theodoro Kos, DO;  Location: [place];  Service: Plastics;  Laterality: Bilateral;    There were no vitals taken for this visit.  Visit Diagnosis:  Right hip pain  Right knee pain          Adult PT Treatment/Exercise - 10/24/14 0700    Exercises Knee/Hip   Active Hamstring Stretch 3 reps;30 seconds  with strap    Pt consents to Trigger Point Dry Needling and  Is aware of Precautions, post-needling soreness and aftercare. Pt received TR DN to R proximal Piriformis.R   Quad , Vastus Lateralis, R lateral gastroc, R lateral hamstring with 60 mm and 40 mm needles. Pt had minimal Localized twitch responses and had moderate muscle lengthening post needling.  Pt also received soft tissue mobilization to same muscle mentioned.  Pt given handout for advanced hamstring stretch.       Education - 10/24/14 1656    Education provided Yes   Education Details REviewed HEP and gave advanced hamstring stretch   Education Details Patient   Methods Explanation;Demonstration;Handout   Comprehension Verbalized understanding;Returned demonstration            PT Long Term Goals - 10/24/14 1641    Title demonstrate and /or  verbalize techniques to reduce the risk of re-injury to include info GE:[id number]/KGMW mechanics and lifting   Time 6   Status Achieved   Title be independent with advanced HEP for Core Strengthening   Time 6   Period Weeks   Status On-going   Title report pain decrease to 2/10 or less in order to drive comfortably for 45 minutes   Time 6   Period Weeks   Status Achieved   Title negotiate descending steps with step over step technique and less than 2/10 pain   Time 6   Period Weeks   Status Achieved   Title improve FOTO from 42% to 31%   Time 6   Period Weeks   Status On-going   Additional Long Term Goals Yes   Title Increase MMT in order to return to physical activity and running group without exacerbation of pain   Time 6   Period Weeks   Status On-going          Plan - 10/24/14 1638    Clinical Impression Statement Pt progressing toward  goals,  LTG #3 and #4 met . Pt will reduce to 1 x a week   PT Plan Pt progressing well.  Will continue 1 x a week. Pt may D/C in next 1 to 2 visits        Problem List Patient Active Problem List   Diagnosis Date Noted  . Right hip pain 09/19/2014  . Right knee pain 09/19/2014  . Dyspnea on exertion 08/08/2014  . Heart palpitations 08/08/2014  . Acquired absence of breast and absent nipple 03/09/2014  . Pre-operative cardiovascular examination 09/09/2013  . Other chronic pulmonary heart diseases 05/27/2013  . SIRS (systemic inflammatory response syndrome) 04/14/2013  . Anemia 04/14/2013  . Dehydration 04/14/2013  . Fever 04/14/2013  . Malignant neoplasm of upper-outer quadrant of female breast 12/30/2012  . Allergy   . IBS (irritable bowel syndrome)   . Thyroid disease   . ADD (attention deficit disorder)   . Lyme disease   . Cancer   . Asthma   . Hypothyroidism   . Contact lens/glasses fitting   . Breast cancer   . DCIS (ductal carcinoma in situ) of breast 12/14/2012       Voncille Lo, PT 11/09/2015 1:47  PM Phone: [phone] Fax: [phone]  PHYSICAL THERAPY DISCHARGE SUMMARY  Visits from Start of Care: 7  Current functional level related to goals / functional outcomes: Unknown   Remaining deficits: As above,  Last known as documented above   Education / Equipment: HEP, Plan:                                                    Patient goals were partially met. Patient is being discharged due to not returning since the last visit.  ?????         Voncille Lo, PT 11/09/2015 1:48 PM Phone: [phone] Fax: [phone]

## 2014-10-25 ENCOUNTER — Ambulatory Visit: Payer: 59 | Admitting: Family Medicine

## 2014-10-25 ENCOUNTER — Encounter: Payer: Self-pay | Admitting: Hematology and Oncology

## 2014-10-25 NOTE — Progress Notes (Signed)
Patient no longer on herceptin. Recd letter from [facility] on asst will expire 11/22/14.

## 2014-10-26 ENCOUNTER — Ambulatory Visit: Payer: 59 | Admitting: Family Medicine

## 2014-10-27 ENCOUNTER — Encounter: Payer: 59 | Admitting: Physical Therapy

## 2014-10-31 NOTE — Telephone Encounter (Signed)
LM informed Mrs. Hull that appt for 11/01/14@ 9:30 am is cancel. I made her aware that I would be glad to r/s her appt...td

## 2014-11-01 ENCOUNTER — Encounter: Payer: 59 | Admitting: Physical Therapy

## 2014-11-02 ENCOUNTER — Ambulatory Visit (INDEPENDENT_AMBULATORY_CARE_PROVIDER_SITE_OTHER): Payer: 59 | Admitting: Family Medicine

## 2014-11-02 ENCOUNTER — Encounter: Payer: Self-pay | Admitting: Family Medicine

## 2014-11-02 VITALS — BP 126/88 | HR 94 | Ht 65.0 in | Wt 150.0 lb

## 2014-11-02 DIAGNOSIS — M25551 Pain in right hip: Secondary | ICD-10-CM

## 2014-11-02 DIAGNOSIS — M25561 Pain in right knee: Secondary | ICD-10-CM

## 2014-11-03 ENCOUNTER — Encounter: Payer: 59 | Admitting: Physical Therapy

## 2014-11-03 NOTE — Progress Notes (Signed)
Patient ID: Lenora Boys, female   DOB: [birth year], 58 y.o.   MRN: [id number]  PCP: Sheela Stack, MD  Subjective:   HPI: Patient is a 58 y.o. female here for right knee, hip pain.  9/24: Patient reports she's recently been trying to get back into running. Recalls a couple days ago doing 2:1 WFU:XNAT - on third interval she felt pain right side of hip to knee. Some mild swelling. No catching, locking, giving out. Hip hurts more than her knee (4/10 level vs 2/10). Had lateral hip injection (likely trochanteric bursa) in July. No bowel/bladder dysfunction. Pain doesn't radiate past her knee. No numbness/tingling.  11/11: Patient reports doing very well without any pain. Able to do her walk:jog program again. No catching, locking, giving out. No hip pain. Doing physical therapy and home exercises - last physical therapy was last week. They are also doing dry needling. Takes advil occasionally.  Past Medical History  Diagnosis Date  . IBS (irritable bowel syndrome)   . ADD (attention deficit disorder)   . Hypothyroidism   . History of breast cancer   . History of cervical cancer   . History of palpitations     last echo 08/22/2014  . Abnormally small mouth   . Dental crowns present   . Difficulty swallowing pills   . Asthma     prn inhaler  . Blood transfusion without reported diagnosis     Current Outpatient Prescriptions on File Prior to Visit  Medication Sig Dispense Refill  . aspirin 81 MG tablet Take 81 mg by mouth daily.    . B Complex-C (B-COMPLEX WITH VITAMIN C) tablet Take 1 tablet by mouth daily.    [name] dextroamphetamine (DEXTROSTAT) 5 MG tablet Take 2.5 mg by mouth.     . EPINEPHrine (EPIPEN) 0.3 mg/0.3 mL DEVI Inject 0.3 mg into the muscle as needed (for allergic reaction).     . Eszopiclone 3 MG TABS TAKE 1 TABLET BY MOUTH IMMEDIATELY BEFORE BEDTIME 30 tablet 3  . hyoscyamine (ANASPAZ) 0.125 MG TBDP disintergrating tablet Place 0.125 mg under the tongue as  needed.     [name] LORazepam (ATIVAN) 1 MG tablet TAKE 1 TABLET BY MOUTH EVERY 8 HOURS AS NEEDED FOR ANXIETY 60 tablet 0  . Multiple Vitamin (MULTIVITAMIN WITH MINERALS) TABS Take 1 tablet by mouth daily.    [name] SYNTHROID 100 MCG tablet TAKE 1 TABLET BY MOUTH DAILY. 30 tablet 4  . vitamin C (ASCORBIC ACID) 500 MG tablet Take 500 mg by mouth daily.    . vitamin E (VITAMIN E) 400 UNIT capsule Take 800 Units by mouth daily.     No current facility-administered medications on file prior to visit.    Past Surgical History  Procedure Laterality Date  . Brain surgery      removal benign frontal lobe cyst in 1987  . Foot surgery Bilateral 07/2012  . Gynecologic cryosurgery  1998  . Breast enhancement surgery Bilateral ~ 2008  . Facial cosmetic surgery  ~ 2010  . Axillary sentinel node biopsy  12/31/2012    Procedure: AXILLARY SENTINEL NODE BIOPSY;  Surgeon: Rolm Bookbinder, MD;  Location: [place];  Service: General;  Laterality: Bilateral;  bilateral sentinel node  . Total mastectomy  12/31/2012    Procedure: TOTAL MASTECTOMY;  Surgeon: Rolm Bookbinder, MD;  Location: [place];  Service: General;  Laterality: Bilateral;  bilateral total mastectomies  . Breast reconstruction with placement of tissue expander and flex hd (acellular  hydrated dermis)  12/31/2012    Procedure: BREAST RECONSTRUCTION WITH PLACEMENT OF TISSUE EXPANDER AND FLEX HD (ACELLULAR HYDRATED DERMIS);  Surgeon: Theodoro Kos, DO;  Location: [place];  Service: Plastics;  Laterality: Bilateral;  bilateral immediate breast reconstruction with expanders and flex hd   . Portacath placement  01/27/2013    Procedure: INSERTION PORT-A-CATH;  Surgeon: Rolm Bookbinder, MD;  Location: [place];  Service: General;  Laterality: Right;  . Incision and drainage of wound Left 04/28/2013    Procedure: IRRIGATION AND DEBRIDEMENT LEFT BREAST WOUND, POSSIBLE CLOSURE OF WOUND WITH DRAIN  PLACEMENT;  Surgeon: Theodoro Kos, DO;  Location: [place];  Service: Plastics;  Laterality: Left;  . Tee without cardioversion N/A 06/04/2013    Procedure: TRANSESOPHAGEAL ECHOCARDIOGRAM (TEE);  Surgeon: Jolaine Artist, MD;  Location: [facility] ENDOSCOPY;  Service: Cardiovascular;  Laterality: N/A;  . Tissue expander placement Left 06/16/2013    Procedure: LEFT BREAST TISSUE EXPANDER REMOVAL;  Surgeon: Theodoro Kos, DO;  Location: [place];  Service: Plastics;  Laterality: Left;  . Dilation and curettage of uterus  2005  . Cervical cerclage    . Latissimus flap to breast Left 09/29/2013    Procedure: LATISSIMUS FLAP TO BREAST WITH TISSUE EXPANDER PLACEMENT FOR LEFT BREAST RECONSTRUCTION;  Surgeon: Theodoro Kos, DO;  Location: [place];  Service: Plastics;  Laterality: Left;  . Tissue expander placement Left 09/29/2013    Procedure: TISSUE EXPANDER;  Surgeon: Theodoro Kos, DO;  Location: [place];  Service: Plastics;  Laterality: Left;  . Removal of bilateral tissue expanders with placement of bilateral breast implants Bilateral 03/09/2014    Procedure: REMOVAL OF BILATERAL TISSUE EXPANDERS WITH PLACEMENT OF BILATERAL BREAST IMPLANTS;  Surgeon: Theodoro Kos, DO;  Location: [place];  Service: Plastics;  Laterality: Bilateral;  . Liposuction Bilateral 03/09/2014    Procedure: LIPOSUCTION ABDOMEN W/ LIPOFILLING LATERAL BILATERAL BREAST;  Surgeon: Theodoro Kos, DO;  Location: [place];  Service: Plastics;  Laterality: Bilateral;  LIPOSUCTION NECK  . Port-a-cath removal Right 03/09/2014    Procedure: REMOVAL PORT-A-CATH;  Surgeon: Theodoro Kos, DO;  Location: [place];  Service: Plastics;  Laterality: Right;  . Total abdominal hysterectomy w/ bilateral salpingoophorectomy  03/28/2004  . Lysis of adhesion  03/28/2004    pelvic  . Abdominoplasty  03/28/2004  . Lipectomy Bilateral 03/28/2004    hip  . Endometrial fulguration   05/12/2003  . Radiofrequency ablation nerves  05/12/2003    uterosacral nerve ablation  . Diagnostic laparoscopy  05/12/2003    right partial salpingectomy; lysis paraovarian adhesions left  . Capsulotomy Left 09/01/2014    Procedure: CAPSULOTOMY OF LEFT BREAST WITH LIPOFILLING FOR SYMMETRY;  Surgeon: Theodoro Kos, DO;  Location: [place];  Service: Plastics;  Laterality: Left;  . Liposuction with lipofilling Bilateral 09/01/2014    Procedure: LIPOSUCTION WITH LIPOFILLING;  Surgeon: Theodoro Kos, DO;  Location: [place];  Service: Plastics;  Laterality: Bilateral;    Allergies  Allergen Reactions  . Betadine [Povidone Iodine] Anaphylaxis  . Iodine Anaphylaxis  . Shellfish-Derived Products Anaphylaxis  . Cleocin [Clindamycin Hcl] Rash  . Ginger Rash  . Penicillins Rash  . Sulfamethoxazole-Trimethoprim Rash    History   Social History  . Marital Status: Widowed    Spouse Name: N/A    Number of Children: N/A  . Years of Education: N/A   Occupational History  . Not on file.   Social History  Main Topics  . Smoking status: Never Smoker   . Smokeless tobacco: Never Used  . Alcohol Use: 0.0 oz/week    0 Not specified per week     Comment: occasionally  . Drug Use: No  . Sexual Activity: Yes   Other Topics Concern  . Not on file   Social History Narrative    Family History  Problem Relation Age of Onset  . Cancer Mother 7    uterine, mult myeloma  . CVA Father   . Cancer Sister 68    breast  . Cancer Maternal Grandmother 70    breast    BP 126/88 mmHg  Pulse 94  Ht 5\' 5"  (1.651 m)  Wt 150 lb (68.04 kg)  BMI 24.96 kg/m2  Review of Systems: See HPI above.    Objective:  Physical Exam:  Gen: NAD  Back/R hip: No gross deformity, scoliosis. No TTP right SI joint, within hip external rotators/glutes.  No midline or bony TTP.  No trochanter tenderness. FROM.  No pain with back, hip passive motion. Strength LEs 5/5 all muscle  groups. Negative SLRs. Negative logroll bilateral hips  R knee: No gross deformity, ecchymoses, swelling. Minimal tenderness joint lines. FROM. Negative ant/post drawers. Negative valgus/varus testing. Negative lachmanns. Negative mcmurrays, apleys, patellar apprehension. NV intact distally.    Assessment & Plan:  1. Right hip pain - 2/2 hip external rotator spasms/strain and SI joint dysfunction.  Much improved following physical therapy and home exercises.  F/u prn.  2. Right knee pain - exam reassuring.  Likely due to mild DJD.  Discussed tylenol, nsaids, capsaicin.  Consider intraarticular cortisone injection in future.  Heat or ice, arch support.  F/u prn.

## 2014-11-03 NOTE — Assessment & Plan Note (Signed)
2/2 hip external rotator spasms/strain and SI joint dysfunction.  Much improved following physical therapy and home exercises.  F/u prn.

## 2014-11-03 NOTE — Assessment & Plan Note (Signed)
exam reassuring.  Likely due to mild DJD.  Discussed tylenol, nsaids, capsaicin.  Consider intraarticular cortisone injection in future.  Heat or ice, arch support.  F/u prn.

## 2014-11-24 ENCOUNTER — Encounter: Payer: Self-pay | Admitting: Hematology and Oncology

## 2014-11-24 ENCOUNTER — Other Ambulatory Visit: Payer: 59

## 2014-11-24 NOTE — Progress Notes (Signed)
Recd note from copay- [employer] asst expired 11/23/14. See prev notes she is no longer on.

## 2014-11-25 ENCOUNTER — Other Ambulatory Visit: Payer: Self-pay | Admitting: *Deleted

## 2014-11-25 DIAGNOSIS — D0510 Intraductal carcinoma in situ of unspecified breast: Secondary | ICD-10-CM

## 2014-11-28 ENCOUNTER — Other Ambulatory Visit (HOSPITAL_BASED_OUTPATIENT_CLINIC_OR_DEPARTMENT_OTHER): Payer: 59

## 2014-11-28 DIAGNOSIS — Z853 Personal history of malignant neoplasm of breast: Secondary | ICD-10-CM

## 2014-11-28 DIAGNOSIS — D0510 Intraductal carcinoma in situ of unspecified breast: Secondary | ICD-10-CM

## 2014-11-28 LAB — COMPREHENSIVE METABOLIC PANEL (CC13)
ALT: 32 U/L (ref 0–55)
ANION GAP: 10 meq/L (ref 3–11)
AST: 33 U/L (ref 5–34)
Albumin: 3.7 g/dL (ref 3.5–5.0)
Alkaline Phosphatase: 50 U/L (ref 40–150)
BUN: 26.1 mg/dL — ABNORMAL HIGH (ref 7.0–26.0)
CO2: 27 mEq/L (ref 22–29)
CREATININE: 0.7 mg/dL (ref 0.6–1.1)
Calcium: 9.6 mg/dL (ref 8.4–10.4)
Chloride: 104 mEq/L (ref 98–109)
EGFR: >90 mL/min/{1.73_m2} (ref >90)
Glucose: 92 mg/dl (ref 70–140)
Potassium: 4.9 mEq/L (ref 3.5–5.1)
Sodium: 140 mEq/L (ref 136–145)
Total Bilirubin: 0.41 mg/dL (ref 0.20–1.20)
Total Protein: 6.8 g/dL (ref 6.4–8.3)

## 2014-11-28 LAB — CBC WITH DIFFERENTIAL/PLATELET
BASO%: 0.6 % (ref 0.0–2.0)
Basophils Absolute: 0 10*3/uL (ref 0.0–0.1)
EOS%: 1.3 % (ref 0.0–7.0)
Eosinophils Absolute: 0.1 10*3/uL (ref 0.0–0.5)
HEMATOCRIT: 44.3 % (ref 34.8–46.6)
HGB: 14.6 g/dL (ref 11.6–15.9)
LYMPH%: 30.6 % (ref 14.0–49.7)
MCH: 28.2 pg (ref 25.1–34.0)
MCHC: 33 g/dL (ref 31.5–36.0)
MCV: 85.5 fL (ref 79.5–101.0)
MONO#: 0.6 10*3/uL (ref 0.1–0.9)
MONO%: 9.5 % (ref 0.0–14.0)
NEUT#: 3.5 10*3/uL (ref 1.5–6.5)
NEUT%: 58 % (ref 38.4–76.8)
PLATELETS: 211 10*3/uL (ref 145–400)
RBC: 5.18 10*6/uL (ref 3.70–5.45)
RDW: 13.8 % (ref 11.2–14.5)
WBC: 6 10*3/uL (ref 3.9–10.3)
lymph#: 1.8 10*3/uL (ref 0.9–3.3)

## 2014-12-01 ENCOUNTER — Ambulatory Visit: Payer: 59 | Admitting: Oncology

## 2014-12-01 ENCOUNTER — Other Ambulatory Visit: Payer: 59

## 2014-12-05 ENCOUNTER — Ambulatory Visit (HOSPITAL_BASED_OUTPATIENT_CLINIC_OR_DEPARTMENT_OTHER): Payer: 59 | Admitting: Hematology and Oncology

## 2014-12-05 ENCOUNTER — Telehealth: Payer: Self-pay | Admitting: Hematology and Oncology

## 2014-12-05 VITALS — BP 128/81 | HR 69 | Temp 97.5°F | Resp 18 | Ht 65.0 in | Wt 154.2 lb

## 2014-12-05 DIAGNOSIS — Z170 Estrogen receptor positive status [ER+]: Secondary | ICD-10-CM

## 2014-12-05 DIAGNOSIS — Z803 Family history of malignant neoplasm of breast: Secondary | ICD-10-CM

## 2014-12-05 DIAGNOSIS — C50912 Malignant neoplasm of unspecified site of left female breast: Secondary | ICD-10-CM

## 2014-12-05 DIAGNOSIS — C50412 Malignant neoplasm of upper-outer quadrant of left female breast: Secondary | ICD-10-CM

## 2014-12-05 NOTE — Telephone Encounter (Signed)
, °

## 2014-12-05 NOTE — Progress Notes (Signed)
Patient Care Team: Sheela Stack, MD as PCP - General (Endocrinology)  DIAGNOSIS: 58 year old female who presented on 12/24/2012 which new diagnosis of invasive ductal carcinoma, ER positive, PR positive, HER-2/neu positive in the left breast.    PRIOR THERAPY: #1 The patient has multiple medical problems but recently she had a mammogram performed that showed a 5.4 cm area of calcifications in the left breast. MRI of the breasts showed the area in her left breast in the upper outer quadrant extended to about 7.2 cm. Needle core biopsy of the left breast showed a high-grade DCIS that was ER positive PR positive.  #2 She was seen by Dr. Rolm Bookbinder regarding surgical options. The patient wanted to have bilateral mastectomies. She is now status post bilateral mastectomy on 12/31/2012. The final pathology did reveal a 2.3 cm invasive ductal carcinoma in the left breast. The tumor was ER +100%, PR +86%, HER-2/neu was amplified with a ratio of 6.81, Ki-67 was elevated at 46%. She is in the process of completing reconstruction.  #3 The patient was seen again in medical oncology for discussion of adjuvant treatment. Since her tumor was HER-2/neu positive, we recommended anti-HER-2/neu- based therapy consisting of Taxotere/Carboplatinum/Herceptin. The Taxotere and Carboplatinum was given every 3 weeks for 5 of 6 cycles. She received Neulasta on day 2. Herceptin was given weekly during the duration of chemotherapy and then every 3 weeks to finish out a one-year of treatment. She received systemic therapy with [place] from 02/04/2013 through 05/13/2013.  #4 The patient will continue Herceptin every 3 weeks to finish out 1 year.  #5 She began antiestrogen therapy with Letrozole 2.5 mg daily starting 06/03/2013, however patient began having aches and pains. We discontinued. Patient was then prescribed aromasin 25 mg daily, however she hasn't started taking it.   CURRENT THERAPY: Not taking  anti-estrogen therapy  CHIEF COMPLIANT: Follow-up of breast cancer  INTERVAL HISTORY: Jane Gutierrez is a 58 year old Caucasian with above-mentioned history of left breast cancer status post bilateral mastectomies because of her extensive family history of breast cancer. She had ER/PR and HER-2 positive breast cancer. She underwent adjuvant chemotherapy with Herceptin maintenance but could not tolerate antiestrogen therapy in spite of trying letrozole, Aromasin, tamoxifen. She had profound depressive symptoms and she did not want to take it anymore. She is here for annual follow-up. She reports no new complaints or concerns regarding the breasts. She is planning to undergo tattoos for the nipples.  REVIEW OF SYSTEMS:   Constitutional: Denies fevers, chills or abnormal weight loss Eyes: Denies blurriness of vision Ears, nose, mouth, throat, and face: Denies mucositis or sore throat Respiratory: Denies cough, dyspnea or wheezes Cardiovascular: Denies palpitation, chest discomfort or lower extremity swelling Gastrointestinal:  Denies nausea, heartburn or change in bowel habits Skin: Denies abnormal skin rashes Lymphatics: Denies new lymphadenopathy or easy bruising Neurological:Denies numbness, tingling or new weaknesses Behavioral/Psych: Mood is stable, no new changes  Breast:  denies any pain or lumps or nodules. Bilateral breasts implants intact All other systems were reviewed with the patient and are negative.  I have reviewed the past medical history, past surgical history, social history and family history with the patient and they are unchanged from previous note.  ALLERGIES:  is allergic to betadine; iodine; shellfish-derived products; cleocin; ginger; penicillins; and sulfamethoxazole-trimethoprim.  MEDICATIONS:  Current Outpatient Prescriptions  Medication Sig Dispense Refill  . aspirin 81 MG tablet Take 81 mg by mouth daily.    . B Complex-C (B-COMPLEX WITH VITAMIN C)  tablet Take 1  tablet by mouth daily.    . cephALEXin (KEFLEX) 500 MG capsule Take 500 mg by mouth.    . dextroamphetamine (DEXTROSTAT) 5 MG tablet Take 2.5 mg by mouth.     . EPINEPHrine (EPIPEN) 0.3 mg/0.3 mL DEVI Inject 0.3 mg into the muscle as needed (for allergic reaction).     . Eszopiclone 3 MG TABS TAKE 1 TABLET BY MOUTH IMMEDIATELY BEFORE BEDTIME 30 tablet 3  . hyoscyamine (ANASPAZ) 0.125 MG TBDP disintergrating tablet Place 0.125 mg under the tongue as needed.     [name] LORazepam (ATIVAN) 1 MG tablet TAKE 1 TABLET BY MOUTH EVERY 8 HOURS AS NEEDED FOR ANXIETY 60 tablet 0  . Multiple Vitamin (MULTIVITAMIN WITH MINERALS) TABS Take 1 tablet by mouth daily.    [name] SYNTHROID 100 MCG tablet TAKE 1 TABLET BY MOUTH DAILY. 30 tablet 4  . vitamin C (ASCORBIC ACID) 500 MG tablet Take 500 mg by mouth daily.    . vitamin E (VITAMIN E) 400 UNIT capsule Take 800 Units by mouth daily.     No current facility-administered medications for this visit.    PHYSICAL EXAMINATION: ECOG PERFORMANCE STATUS: 0 - Asymptomatic  Filed Vitals:   12/05/14 1134  BP: 128/81  Pulse: 69  Temp: 97.5 F (36.4 C)  Resp: 18   Filed Weights   12/05/14 1134  Weight: 154 lb 3.2 oz (69.945 kg)    GENERAL:alert, no distress and comfortable SKIN: skin color, texture, turgor are normal, no rashes or significant lesions EYES: normal, Conjunctiva are pink and non-injected, sclera clear OROPHARYNX:no exudate, no erythema and lips, buccal mucosa, and tongue normal  NECK: supple, thyroid normal size, non-tender, without nodularity LYMPH:  no palpable lymphadenopathy in the cervical, axillary or inguinal LUNGS: clear to auscultation and percussion with normal breathing effort HEART: regular rate & rhythm and no murmurs and no lower extremity edema ABDOMEN:abdomen soft, non-tender and normal bowel sounds Musculoskeletal:no cyanosis of digits and no clubbing  NEURO: alert & oriented x 3 with fluent speech, no focal motor/sensory  deficits BREAST: No palpable abnormalities in the axilla. Breast implants are intact  LABORATORY DATA:  I have reviewed the data as listed   Chemistry      Component Value Date/Time   NA 140 11/28/2014 0813   NA 137 09/21/2013 0842   K 4.9 11/28/2014 0813   K 4.4 09/21/2013 0842   CL 101 09/21/2013 0842   CL 104 06/10/2013 1312   CO2 27 11/28/2014 0813   CO2 25 09/21/2013 0842   BUN 26.1* 11/28/2014 0813   BUN 26* 09/21/2013 0842   CREATININE 0.7 11/28/2014 0813   CREATININE 0.51 09/29/2013 2008      Component Value Date/Time   CALCIUM 9.6 11/28/2014 0813   CALCIUM 9.4 09/21/2013 0842   ALKPHOS 50 11/28/2014 0813   ALKPHOS 52 09/21/2013 0842   AST 33 11/28/2014 0813   AST 29 09/21/2013 0842   ALT 32 11/28/2014 0813   ALT 24 09/21/2013 0842   BILITOT 0.41 11/28/2014 0813   BILITOT 0.3 09/21/2013 0842       Lab Results  Component Value Date   WBC 6.0 11/28/2014   HGB 14.6 11/28/2014   HCT 44.3 11/28/2014   MCV 85.5 11/28/2014   PLT 211 11/28/2014   NEUTROABS 3.5 11/28/2014    ASSESSMENT & PLAN:  Breast cancer, left breast Left breast invasive ductal carcinoma ER 100% PR 86% HER-2 positive ratio 6.81, Ki-67 46 % status post bilateral  mastectomies followed by reconstruction status post 6 cycles of [place] followed by Herceptin maintenance completed 02/16/2014, try different antiestrogen therapies including Aromasin, letrozole, tamoxifen but could not tolerate any of them because of severe depression symptoms.   Surveillance: 1. Breast exam 12/05/2014 normal axilla and supraclavicular areas, bilateral breast implants normal patient to get nipple tattoos coming up 2. No role of imaging  Survivorship: Patient exercises twice a day and stays very active. I encouraged her to continue to do her physical activities and exercises.  Return to clinic annually for physical exams and follow-up. I discussed with her that there is no role of blood work for breast cancer  surveillance.       No orders of the defined types were placed in this encounter.   The patient has a good understanding of the overall plan. she agrees with it. She will call with any problems that may develop before her next visit here.   Rulon Eisenmenger, MD 12/05/2014 12:49 PM

## 2014-12-05 NOTE — Assessment & Plan Note (Signed)
Left breast invasive ductal carcinoma ER 100% PR 86% HER-2 positive ratio 6.81, Ki-67 46 % status post bilateral mastectomies followed by reconstruction status post 6 cycles of [place] followed by Herceptin maintenance completed 02/16/2014, try different antiestrogen therapies including Aromasin, letrozole, tamoxifen but could not tolerate any of them because of severe depression symptoms.   Surveillance: 1. Breast exam 12/05/2014 normal axilla and supraclavicular areas, bilateral breast implants normal patient to get nipple tattoos coming up 2. No role of imaging  Survivorship: Patient exercises twice a day and stays very active. I encouraged her to continue to do her physical activities and exercises.  Return to clinic annually for physical exams and follow-up. I discussed with her that there is no role of blood work for breast cancer surveillance.

## 2014-12-28 DIAGNOSIS — E041 Nontoxic single thyroid nodule: Secondary | ICD-10-CM | POA: Insufficient documentation

## 2015-03-01 ENCOUNTER — Other Ambulatory Visit: Payer: Self-pay | Admitting: Obstetrics & Gynecology

## 2015-03-01 ENCOUNTER — Other Ambulatory Visit: Payer: Self-pay | Admitting: *Deleted

## 2015-03-01 DIAGNOSIS — N63 Unspecified lump in unspecified breast: Secondary | ICD-10-CM

## 2015-03-09 ENCOUNTER — Ambulatory Visit
Admission: RE | Admit: 2015-03-09 | Discharge: 2015-03-09 | Disposition: A | Discharge status: Home / Self Care | Payer: 59 | Source: Ambulatory Visit | Attending: Obstetrics & Gynecology | Admitting: Obstetrics & Gynecology

## 2015-03-09 DIAGNOSIS — N63 Unspecified lump in unspecified breast: Secondary | ICD-10-CM

## 2015-04-14 ENCOUNTER — Other Ambulatory Visit: Payer: Self-pay | Admitting: Hematology and Oncology

## 2015-04-14 NOTE — Telephone Encounter (Signed)
Last OV 12/05/14.  Next OV 12/07/15.  Chart Reviewed.  Called in to [facility]

## 2015-04-17 ENCOUNTER — Other Ambulatory Visit: Payer: Self-pay | Admitting: Oncology

## 2015-06-19 ENCOUNTER — Other Ambulatory Visit: Payer: Self-pay | Admitting: Obstetrics & Gynecology

## 2015-06-20 ENCOUNTER — Other Ambulatory Visit (HOSPITAL_COMMUNITY): Payer: Self-pay | Admitting: Obstetrics & Gynecology

## 2015-06-20 ENCOUNTER — Other Ambulatory Visit: Payer: Self-pay | Admitting: Obstetrics & Gynecology

## 2015-06-20 DIAGNOSIS — N6320 Unspecified lump in the left breast, unspecified quadrant: Secondary | ICD-10-CM

## 2015-06-20 DIAGNOSIS — Z853 Personal history of malignant neoplasm of breast: Secondary | ICD-10-CM

## 2015-06-20 DIAGNOSIS — N6310 Unspecified lump in the right breast, unspecified quadrant: Secondary | ICD-10-CM

## 2015-06-20 LAB — CYTOLOGY - PAP

## 2015-06-23 ENCOUNTER — Other Ambulatory Visit: Payer: 59

## 2015-06-27 ENCOUNTER — Encounter: Payer: Self-pay | Admitting: Adult Health

## 2015-06-28 ENCOUNTER — Encounter: Payer: Self-pay | Admitting: Adult Health

## 2015-06-28 DIAGNOSIS — C50412 Malignant neoplasm of upper-outer quadrant of left female breast: Secondary | ICD-10-CM | POA: Insufficient documentation

## 2015-06-28 NOTE — Progress Notes (Signed)
[name] stopped by my office to inquire about scheduling an appointment to see me in the Survivorship Clinic.  She was referred by Dr. Viona Gilmore. Evette Cristal at [facility].    I have scheduled her survivorship visit for this Friday, 06/30/15 at 2pm.  [name] is aware of this appointment and I gave her instructions on where to check in for the appointment.  She has my direct office number and email address to contact me with any questions.  I look forward to participating in her care.   Mike Craze, NP [place] [phone]

## 2015-06-30 ENCOUNTER — Encounter: Payer: Self-pay | Admitting: Adult Health

## 2015-06-30 ENCOUNTER — Other Ambulatory Visit: Payer: Self-pay | Admitting: Obstetrics & Gynecology

## 2015-06-30 ENCOUNTER — Ambulatory Visit (HOSPITAL_BASED_OUTPATIENT_CLINIC_OR_DEPARTMENT_OTHER): Payer: 59 | Admitting: Adult Health

## 2015-06-30 DIAGNOSIS — Z853 Personal history of malignant neoplasm of breast: Secondary | ICD-10-CM

## 2015-06-30 DIAGNOSIS — N63 Unspecified lump in breast: Secondary | ICD-10-CM

## 2015-06-30 DIAGNOSIS — C50911 Malignant neoplasm of unspecified site of right female breast: Secondary | ICD-10-CM

## 2015-06-30 DIAGNOSIS — R5383 Other fatigue: Secondary | ICD-10-CM

## 2015-06-30 DIAGNOSIS — G479 Sleep disorder, unspecified: Secondary | ICD-10-CM | POA: Diagnosis not present

## 2015-06-30 DIAGNOSIS — C50912 Malignant neoplasm of unspecified site of left female breast: Principal | ICD-10-CM

## 2015-06-30 DIAGNOSIS — F409 Phobic anxiety disorder, unspecified: Secondary | ICD-10-CM

## 2015-06-30 DIAGNOSIS — F5105 Insomnia due to other mental disorder: Secondary | ICD-10-CM

## 2015-06-30 DIAGNOSIS — R634 Abnormal weight loss: Secondary | ICD-10-CM

## 2015-06-30 MED ORDER — BUSPIRONE HCL 10 MG PO TABS: 10.0000 mg | ORAL_TABLET | Freq: Every evening | ORAL | Status: DC | PRN

## 2015-06-30 NOTE — Progress Notes (Signed)
CLINIC:  Cancer Survivorship   REASON FOR VISIT:  Routine follow-up post-treatment for a recent history of breast cancer.  BRIEF ONCOLOGIC HISTORY:    Breast cancer, left breast (Resolved)    Initial Diagnosis Breast cancer, left breast   12/04/2012 Initial Biopsy Left breast needle core biopsy (UOQ): Grade 3, DCIS with necrosis and calcs. ER+ (100%), PR+ (86%).     Bilateral breast cancer   12/04/2012 Initial Biopsy LEFT breast needle core biopsy (UOQ): Grade 3, DCIS with necrosis and calcs. ER+ (100%), PR+ (86%).    12/14/2012 Breast MRI LEFT breast: Clumped nodular enhancement measuring 7.2 x 3.2 x 2.6 cm. Also associated post-biopsy change. RIGHT breast: At 12 o'clock location, there is 58mm slightly irregular mass-like area of abnml enhancement. No lymphadenopathy.    12/25/2012 Initial Biopsy RIGHT breast needle core biopsy (upper centeral): Grade 1-2, IDC, DCIS with calcs. ER+ (100%), PR+ (100%), HER2- (ratio 1.23). Ki67 4%.    12/25/2012 Initial Diagnosis Bilateral breast cancer   12/31/2012 Surgery Bilateral mastectomies with bilat SLNB Donne Hazel). LEFT: Grade 2, IDC, 2.3 cm. (+) LVI. Grade 3 DCIS w/comedonecrosis & calcs. (-) margins. 1 left ax SLN neg.  ER+ (99%), PR+ (10%), HER2 repeated and (+) ratio (6.81). Ki67 46%. RIGHT: Benign, 1 SLN neg.   12/31/2012 Pathologic Stage LEFT breast: pT2, pN0, pMx. Stage IIA.    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 EF 55-60%   02/04/2013 - 05/13/2013 Adjuvant Chemotherapy Taxotere/Carbo/Herceptin q 3 wks x 5 cycles completed. (planned for 6 cycles but d/c'd early due to pt wishes and recent infection).    05/2013 -  Anti-estrogen oral therapy Attempted to take Letrozole, Aromasin, & Tamoxifen but patient could not tolerate any anti-estrogen therapy due to severe depressive symptoms and joint/muscle aches & pains.  No longer taking anti-estrogen therapy.   09/29/2013 Surgery LEFT breast reconstruction with lat flap and placement of tissue expander ([facility]).    11/26/2013 Imaging CT chest: No definite CT findings for chest wall recurrence and no suspicious supraclavicular or axillary adenopathy.  Negative for metastatic disease in lungs or bones.     - 02/16/2014 Adjuvant Chemotherapy Herceptin maintenance x 1 year completed.    03/09/2014 Surgery Removal of bilateral tissue expanders and placement of bilateral breast implants ([facility]).    03/09/2014 Procedure Right scar biopsy: No malignancy & Left breast capsule biopsy: No evidence of malignancy.    08/22/2014 Echocardiogram Post-treatment. EF 55-60%   06/30/2015 Survivorship Survivorship Care Plan given to patient and reviewed with her during in-person visit.     INTERVAL HISTORY:  [name] presents to the [facility] today for our initial meeting to review her survivorship care plan detailing her treatment course for breast cancer, as well as monitoring long-term side effects of that treatment, education regarding health maintenance, screening, and overall wellness and health promotion.      [name] has completed active treatment for her bilateral breast cancer. She completed 1 year of Herceptin maintenance, which completed in 01/2014.  She reports feeling overall quite well, with the exception of fatigue and trouble sleeping.. She takes Lunesta, which helps her fall asleep, but she has difficulty staying asleep. She reports getting about 6 hours of sleep per night and feeling very tired during the day.  Her personal life has recently changed, as she is dating and in a relationship, which has changed her daily routine.  She exercises daily and eats a healthy diet.  Her last  breast reconstruction procedure was in Fall 2015 and she has also been working with a tattoo specialist for nipple tattoos.  She also completed treatment for vaginal dryness and urinary frequency/incontinence with several sessions of  the MonaLisa Touch procedure (done at her gynecologist's office), which helped tremendously.  She no longer has any GU complaints. She reports that she has unintentionally lost 20+ pounds in the past 2 years since her cancer diagnosis, but reports that she is more physically active (works out 7 days per week) and is eating better.  Her thyroid function was recently checked by her PCP and was normal.  Aside from the fatigue/sleep concerns, she overall feels quite well.     REVIEW OF SYSTEMS:  General: Denies fever, chills. Cardiac: Denies palpitations, chest pain. Endorses right lower extremity edema at times (chronic issue).  Respiratory: Denies cough, shortness of breath, and dyspnea on exertion.  GI: Denies abdominal pain, constipation, diarrhea, nausea, or vomiting.  GU: Denies dysuria, hematuria, vaginal bleeding, vaginal discharge, or vaginal dryness.  Musculoskeletal: Endorses right knee/hip pain at times. Neuro: Denies headache or recent falls. Denies peripheral neuropathy. Skin: Denies rash, pruritis, or open wounds.  Breast: per HPI. Psych: Denies depression, anxiety, or memory loss.   A 14-point review of systems was completed and was negative, except as noted above.   ONCOLOGY TREATMENT TEAM:  1. Surgeon:  Dr. Donne Hazel at [facility] 2. Plastic Surgeon: Dr. Migdalia Dk 3. Medical Oncologist: Dr. Lindi Adie      PAST MEDICAL/SURGICAL HISTORY:  Past Medical History  Diagnosis Date  . IBS (irritable bowel syndrome)   . ADD (attention deficit disorder)   . Hypothyroidism   . History of breast cancer   . History of cervical cancer   . History of palpitations     last echo 08/22/2014  . Abnormally small mouth   . Dental crowns present   . Difficulty swallowing pills   . Asthma     prn inhaler  . Blood transfusion without reported diagnosis    Past Surgical History  Procedure Laterality Date  . Brain surgery      removal benign frontal lobe cyst in 1987  . Foot  surgery Bilateral 07/2012  . Gynecologic cryosurgery  1998  . Breast enhancement surgery Bilateral ~ 2008  . Facial cosmetic surgery  ~ 2010  . Axillary sentinel node biopsy  12/31/2012    Procedure: AXILLARY SENTINEL NODE BIOPSY;  Surgeon: Rolm Bookbinder, MD;  Location: [place];  Service: General;  Laterality: Bilateral;  bilateral sentinel node  . Total mastectomy  12/31/2012    Procedure: TOTAL MASTECTOMY;  Surgeon: Rolm Bookbinder, MD;  Location: [place];  Service: General;  Laterality: Bilateral;  bilateral total mastectomies  . Breast reconstruction with placement of tissue expander and flex hd (acellular hydrated dermis)  12/31/2012    Procedure: BREAST RECONSTRUCTION WITH PLACEMENT OF TISSUE EXPANDER AND FLEX HD (ACELLULAR HYDRATED DERMIS);  Surgeon: Theodoro Kos, DO;  Location: [place];  Service: Plastics;  Laterality: Bilateral;  bilateral immediate breast reconstruction with expanders and flex hd   . Portacath placement  01/27/2013    Procedure: INSERTION PORT-A-CATH;  Surgeon: Rolm Bookbinder, MD;  Location: [place];  Service: General;  Laterality: Right;  . Incision and drainage of wound Left 04/28/2013    Procedure: IRRIGATION AND DEBRIDEMENT LEFT BREAST WOUND, POSSIBLE CLOSURE OF WOUND WITH DRAIN PLACEMENT;  Surgeon: Theodoro Kos, DO;  Location: [place];  Service: Plastics;  Laterality: Left;  . Tee without cardioversion N/A 06/04/2013    Procedure: TRANSESOPHAGEAL ECHOCARDIOGRAM (TEE);  Surgeon: Jolaine Artist, MD;  Location: [facility];  Service: Cardiovascular;  Laterality: N/A;  . Tissue expander placement Left 06/16/2013    Procedure: LEFT BREAST TISSUE EXPANDER REMOVAL;  Surgeon: Theodoro Kos, DO;  Location: [place];  Service: Plastics;  Laterality: Left;  . Dilation and curettage of uterus  2005  . Cervical cerclage    . Latissimus flap to breast Left 09/29/2013     Procedure: LATISSIMUS FLAP TO BREAST WITH TISSUE EXPANDER PLACEMENT FOR LEFT BREAST RECONSTRUCTION;  Surgeon: Theodoro Kos, DO;  Location: [place];  Service: Plastics;  Laterality: Left;  . Tissue expander placement Left 09/29/2013    Procedure: TISSUE EXPANDER;  Surgeon: Theodoro Kos, DO;  Location: [place];  Service: Plastics;  Laterality: Left;  . Removal of bilateral tissue expanders with placement of bilateral breast implants Bilateral 03/09/2014    Procedure: REMOVAL OF BILATERAL TISSUE EXPANDERS WITH PLACEMENT OF BILATERAL BREAST IMPLANTS;  Surgeon: Theodoro Kos, DO;  Location: [place];  Service: Plastics;  Laterality: Bilateral;  . Liposuction Bilateral 03/09/2014    Procedure: LIPOSUCTION ABDOMEN W/ LIPOFILLING LATERAL BILATERAL BREAST;  Surgeon: Theodoro Kos, DO;  Location: [place];  Service: Plastics;  Laterality: Bilateral;  LIPOSUCTION NECK  . Port-a-cath removal Right 03/09/2014    Procedure: REMOVAL PORT-A-CATH;  Surgeon: Theodoro Kos, DO;  Location: [place];  Service: Plastics;  Laterality: Right;  . Total abdominal hysterectomy w/ bilateral salpingoophorectomy  03/28/2004  . Lysis of adhesion  03/28/2004    pelvic  . Abdominoplasty  03/28/2004  . Lipectomy Bilateral 03/28/2004    hip  . Endometrial fulguration  05/12/2003  . Radiofrequency ablation nerves  05/12/2003    uterosacral nerve ablation  . Diagnostic laparoscopy  05/12/2003    right partial salpingectomy; lysis paraovarian adhesions left  . Capsulotomy Left 09/01/2014    Procedure: CAPSULOTOMY OF LEFT BREAST WITH LIPOFILLING FOR SYMMETRY;  Surgeon: Theodoro Kos, DO;  Location: [place];  Service: Plastics;  Laterality: Left;  . Liposuction with lipofilling Bilateral 09/01/2014    Procedure: LIPOSUCTION WITH LIPOFILLING;  Surgeon: Theodoro Kos, DO;  Location: [place];  Service: Plastics;  Laterality: Bilateral;     ALLERGIES:  Allergies    Allergen Reactions  . Betadine [Povidone Iodine] Anaphylaxis  . Iodine Anaphylaxis  . Shellfish-Derived Products Anaphylaxis  . Cleocin [Clindamycin Hcl] Rash  . Ginger Rash  . Penicillins Rash  . Sulfamethoxazole-Trimethoprim Rash     CURRENT MEDICATIONS:  Current Outpatient Prescriptions on File Prior to Visit  Medication Sig Dispense Refill  . aspirin 81 MG tablet Take 81 mg by mouth daily.    . B Complex-C (B-COMPLEX WITH VITAMIN C) tablet Take 1 tablet by mouth daily.    [name] dextroamphetamine (DEXTROSTAT) 5 MG tablet Take 2.5 mg by mouth.     . EPINEPHrine (EPIPEN) 0.3 mg/0.3 mL DEVI Inject 0.3 mg into the muscle as needed (for allergic reaction).     . Eszopiclone 3 MG TABS TAKE 1 TABLET BY MOUTH IMMEDIATELY BEFORE BEDTIME 30 tablet 3  . hyoscyamine (ANASPAZ) 0.125 MG TBDP disintergrating tablet Place 0.125 mg under the tongue as needed.     [name] LORazepam (ATIVAN) 1 MG tablet TAKE 1 TABLET BY MOUTH EVERY 8 HOURS AS NEEDED FOR ANXIETY 60 tablet 0  . Multiple Vitamin (MULTIVITAMIN WITH MINERALS) TABS Take 1 tablet by mouth  daily.    [name] SYNTHROID 100 MCG tablet TAKE 1 TABLET BY MOUTH DAILY. 30 tablet 4  . vitamin C (ASCORBIC ACID) 500 MG tablet Take 500 mg by mouth daily.    . vitamin E (VITAMIN E) 400 UNIT capsule Take 800 Units by mouth daily.     No current facility-administered medications on file prior to visit.     ONCOLOGIC FAMILY HISTORY:  Family History  Problem Relation Age of Onset  . Cancer Mother 60    uterine, mult myeloma  . CVA Father   . Cancer Sister 2    breast  . Cancer Maternal Grandmother 28    breast     GENETIC COUNSELING/TESTING (done on 01/20/13): Revealed CHEK2 genetic mutation: c.1100del (p.Thur367Metfs*15).  Other genes tested included: APC, ATM, BARD1, BMPR1A, BRCA1, BRCA2, BRIP1, CDH1, CDK4, CDKN2A, CHEK2, EPCAM, MLH1, MSH2, MSH6, MUTYH, NBN, PALB2, PMS2, PTEN, RAD51C, RAD51D, SMAD4, STK11, and TP53.   SOCIAL HISTORY:  CAROLLYNN PENNYWELL is  single and lives alone in [place], [place]. Her fiance of 10+ years passed away about 2 years ago.  She has a daughter.  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   She denies any current tobacco, alcohol, or illicit drug abuse.     PHYSICAL EXAMINATION:  General: Well-nourished, well-appearing female in no acute distress.  She is unaccompanied in clinic today.   HEENT: Head is atraumatic and normocephalic.  Pupils equal and reactive to light and accomodation. Conjunctivae clear without exudate.  Sclerae anicteric. Oral mucosa is pink, moist, and intact without lesions.  Oropharynx is pink without lesions or erythema.  Breast/Chest wall exam: Bilateral breast implants in place. Healed scars. No skin erythema or induration. Bilateral nipple tattoos in place. -Left breast: At 11 o'clock position, there is known fat necrosis palpated. Healed lat flap scar without redness or nodularities. No palpable masses or nodularities.  -Right breast: At 2:30 position, about 6cm from nipple, there is a small (subcentimeter), round, mobile nodule palpated. This is the area of patient concern.  Lymph: No cervical, supraclavicular, infraclavicular, or axillary lymphadenopathy noted on palpation.   Cardiovascular: Regular rate and rhythm without murmurs, rubs, or gallops. Respiratory: Clear to auscultation bilaterally. Chest expansion symmetric without accessory muscle use on inspiration or expiration.  GI: Abdomen soft and round. No tenderness to palpation. Bowel sounds normoactive in 4 quadrants. No hepatosplenomegaly.   GU: Deferred.  Musculoskeletal: Muscle strength 5/5 in all extremities.  Mildly limited shoulder ROM bilaterally.  Neuro: No focal deficits. Steady gait.  Psych: Mood and affect normal and appropriate for situation.  Extremities: No edema, cyanosis, or clubbing.  Skin: Warm and dry. No open lesions noted.   LABORATORY DATA:  None for this  visit.  DIAGNOSTIC IMAGING:  None for this visit.     ASSESSMENT AND PLAN:   1. History of breast cancer:  [name] will follow-up with her medical oncologist, Dr. Lindi Adie in 11/2015 with history and physical exam per surveillance protocol.  She was unable to tolerate anti-estrogen therapy about attempts with 3 different medications. Therefore, she is now on observation alone.  She should be seen by an oncology provider every 6 months for office visit and breast exam.  The patient is willing to be seen in Survivorship Clinic once per year and by Dr. Lindi Adie once per year, with visits 6 months a part for each.  Dr. Lindi Adie will notify me if he wishes to have an alternate follow-up plan  for this patient.  A comprehensive survivorship care plan and treatment summary was reviewed with the patient today detailing her breast cancer diagnosis, treatment course, potential late/long-term effects of treatment, appropriate follow-up care with recommendations for the future, and patient education resources.  A copy of this summary, along with a letter will be sent to the patient's primary care provider via mail/fax/In Basket message after today's visit. I also sent a copy to the patient's gynecologist, as well.  [name] is welcome to return to the Survivorship Clinic in the future, as needed; no follow-up will be scheduled at this time.    2. Right breast nodule: We discussed evaluating this nodule with ultrasound (since she recently had the left breast evaluated with ultrasound and was found to be benign fat necrosis). The patient prefers to have a "baseline" MRI based on her history and CHEK2 genetic mutation. Her gynecologist placed this order on 06/20/15. The MRI is now scheduled for 07/12/15.   I have made Drs. Neta Ehlers, and Sanger aware of this palpable finding on Ms. Hull's physical exam today and the current plan to evaluate the nodule, based on Ms. Hull's wishes.    3. Fatigue & sleep disturbance:  By  Ms. Claudean Kinds report, she is dating and in a new relationship which has changed her normal daily routine. She reports that she is often able to fall asleep, but has difficulty maintaining sleep.  Likely her daytime fatigue is related to this sleep disturbance and lack of adequate restorative sleep.  She endorses that she does occasionally have some anxiety or "racing thoughts" at night, which sometimes she wakes up thinking about.  She expressed interest in trying a new medication to help her sleep, since the Lunesta was only offering minimal relief.  I prescribed Buspar to be used prn at bedtime and explained that she should not take the Buspar and Lunesta together, to avoid over-sedation. She expressed verbal understanding.  We also talked about her trying some OTC medications like Benadryl or Melatonin to help her sleep as well.  She was appreciative for having several options to help her.  I also reviewed with her the importance of sleep hygiene and trying to be conscious about establishing a nighttime routine to help "train" the brain and body to regulate the sleep-wake cycle.  Lastly, I encouraged her continued physical activity, as this has been proven to help combat fatigue for cancer survivors.   4. Unintentional weight loss: [name] has lost about 20 pounds since her cancer diagnosis in late 2013/2014.  She states that this weight loss was not intentional, but she did become more physically active and change her diet after her diagnosis.  She also experienced the death of her fiance during that timeframe and had several other life/health stressors that she attributes to some of that weight loss.  Her weight has now stabilized by her report. She also recently had her thyroid evaluated, as she is taking Synthroid, and her thyroid function tests were normal per her report.  I encouraged Ms. Hull to remain in close follow-up with her PCP and to continue to monitor her weight.  Unintentional weight loss can be  a nonspecific sign of cancer and [name] may need further evaluation if her weight does not continue to remain stable.   5. Bone health:  Given Ms. Claudean Kinds age and history of breast cancer she is at risk for bone demineralization.  She will be eligible for DEXA scan at age 33, or  sooner if clinically indicated.  In the meantime, she was encouraged to increase her consumption of foods rich in calcium, as well as increase her weight-bearing activities.  She was given education on specific activities to promote bone health.  6. Cancer screening:  Due to Ms. Hull's history and her age, she should receive screening for skin cancers, colon cancer, and gynecologic cancers.  The information and recommendations are listed on the patient's comprehensive care plan/treatment summary and were reviewed in detail with the patient.    7. Health maintenance and wellness promotion: [name] was encouraged to consume 5-7 servings of fruits and vegetables per day. We reviewed the "Nutrition Rainbow" handout, as well as the handout about "Nutrition for Breast Cancer Survivors."  She was also encouraged to engage in moderate to vigorous exercise for 30 minutes per day most days of the week. We discussed the [employer] fitness program, which is designed for cancer survivors to help them become more physically fit after cancer treatments.  She was instructed to limit her alcohol consumption and continue to abstain from tobacco use.   8. Support services/counseling: It is not uncommon for this period of the patient's cancer care trajectory to be one of many emotions and stressors.  We discussed an opportunity for her to participate in the next session of [facility] ("Finding Your New Normal") support group series designed for patients after they have completed treatment.   [name] was encouraged to take advantage of our many other support services programs, support groups, and/or counseling in coping with her new life as a cancer  survivor after completing anti-cancer treatment.  She was offered support today through active listening and expressive supportive counseling.  She was given information regarding our available services and encouraged to contact me with any questions or for help enrolling in any of our support group/programs.    A total of 50 minutes of face-to-face time was spent with this patient with greater than 50% of that time in counseling and care-coordination.   Mike Craze, NP Survivorship Program [place] [phone]   Note: PRIMARY CARE PROVIDER Sheela Stack, [place] [phone]

## 2015-07-01 NOTE — Progress Notes (Signed)
Regarding Ms. Hull's unintended weight loss, the progress note states that her 20+ lb weight loss occurred over a period of 2 years.  However, I verified the timeframe and exact amount of weight loss with the patient and she stated that she has unintentionally lost about 23 lbs in the past 10-12 months.    I will make Ms. [facility] treatment team (Drs. Neta Ehlers, & Sanger) aware of this unintentional weight loss.    Mike Craze, NP [place] [phone]

## 2015-07-03 ENCOUNTER — Encounter: Payer: Self-pay | Admitting: Adult Health

## 2015-07-03 ENCOUNTER — Other Ambulatory Visit: Payer: Self-pay | Admitting: Adult Health

## 2015-07-03 DIAGNOSIS — R634 Abnormal weight loss: Secondary | ICD-10-CM

## 2015-07-03 DIAGNOSIS — C50912 Malignant neoplasm of unspecified site of left female breast: Secondary | ICD-10-CM

## 2015-07-03 DIAGNOSIS — C50911 Malignant neoplasm of unspecified site of right female breast: Secondary | ICD-10-CM

## 2015-07-03 DIAGNOSIS — R5382 Chronic fatigue, unspecified: Secondary | ICD-10-CM

## 2015-07-03 NOTE — Progress Notes (Signed)
I spoke with Jane Gutierrez via phone this morning to follow-up on our office visit from Friday.  Based on the clarification of her reported fatigue and significant unintentional weight loss (20+ lbs in past 10 months), additional imaging is warranted to rule out recurrence or new primary malignancy.   [name] shared with me via email the most recent results of lab work obtained by her PCP, Dr. Reynold Bowen.  Her hemoglobin/hematocrit were normal and her thyroid function tests were also normal [complete results sent to HIM for scanning] ruling out anemia or thyroid dysfunction as potential etiologies of the unintentional weight loss and fatigue.   Based on this information, a CT chest/abdomen/pelvis has been ordered for further evaluation.  [name] voiced verbal understanding of this plan and agrees with it.  She has had several imaging abnormalities that were noted and have been followed over time.  Below is the most recent scans & labs completed and findings.    -Last colonoscopy: 10/30/10:  Colon, polyp(s), transverse :TUBULAR ADENOMA(ONE FRAGMENT). NO HIGH GRADE DYSPLASIA OR MALIGNANCY IDENTIFIED.  -Last vaginal pap (h/o TAH/BSO): 06/18/15:  LOW GRADE SQUAMOUS INTRAEPITHELIAL LESION: CIN-1/ HPV (LSIL).  HPV High Risk: DETECTED.   -Last Ultrasound H&N (for right thyroid nodule follow-up): 09/26/14: Stable small bilateral subcentimeter thyroid nodules.  Tiny small minimally complex cystic nodules in the right upper and lower poles measure 3 mm in greatest diameter. No significant right thyroid abnormality. Small hypoechoic nodule in the left upper pole measures 5 mm. No other significant left thyroid Abnormality.  -Last CT Chest: 11/26/13:  1. Bilateral breast prosthesis with asymmetric soft tissue thickening on the left prosthesis but no discrete mass. Also, the left pectoralis muscle is asymmetrically enlarged on the left or atrophied on the right. I do not see any definite CT findings  for chest wall recurrence and there is no supraclavicular or axillary adenopathy. 2. No CT findings for metastatic disease involving the lungs or bones.  -Last CT Abdomen (for left adrenal abnormality follow-up): 04/11/14:  Stable enlargement of the left adrenal gland without discrete mass. No acute abnormality is noted.  -Last ECHO (s/p 1-year Herceptin maintenance): 08/22/14 LV EF: 55% -  60%  -Most recent labs: 05/29/15 (with PCP):  Hbg: 14.0 Hct:41.4 Plt: 279 WBC: 6.10  Sodium: 138 Potassium 4.3 Calcium: 9.0 Albumin: 3.5 AST: 34 ALT: 30 Alk Phos: 51 Total Bili: 0.4 Glucose: 80 BUN: 28 Creatinine: 0.7  TSH: 0.77 Free T4: 1.1   I have made Drs. Neta Ehlers, & Sanger aware of this patient's plan of care.   Mike Craze, NP [place] [phone]

## 2015-07-10 ENCOUNTER — Ambulatory Visit (HOSPITAL_COMMUNITY)
Admission: RE | Admit: 2015-07-10 | Discharge: 2015-07-10 | Disposition: A | Discharge status: Home / Self Care | Payer: 59 | Source: Ambulatory Visit | Attending: Adult Health | Admitting: Adult Health

## 2015-07-10 ENCOUNTER — Encounter (HOSPITAL_COMMUNITY): Payer: Self-pay

## 2015-07-10 ENCOUNTER — Other Ambulatory Visit: Payer: Self-pay | Admitting: Adult Health

## 2015-07-10 ENCOUNTER — Telehealth: Payer: Self-pay | Admitting: Adult Health

## 2015-07-10 DIAGNOSIS — Z9013 Acquired absence of bilateral breasts and nipples: Secondary | ICD-10-CM | POA: Insufficient documentation

## 2015-07-10 DIAGNOSIS — R634 Abnormal weight loss: Secondary | ICD-10-CM | POA: Diagnosis not present

## 2015-07-10 DIAGNOSIS — Z853 Personal history of malignant neoplasm of breast: Secondary | ICD-10-CM | POA: Insufficient documentation

## 2015-07-10 DIAGNOSIS — C50912 Malignant neoplasm of unspecified site of left female breast: Secondary | ICD-10-CM

## 2015-07-10 DIAGNOSIS — C50911 Malignant neoplasm of unspecified site of right female breast: Secondary | ICD-10-CM

## 2015-07-10 MED ORDER — IOHEXOL 300 MG/ML  SOLN
100.0000 mL | Freq: Once | INTRAMUSCULAR | Status: AC | PRN
  Administered 2015-07-10: 100 mL via INTRAVENOUS

## 2015-07-10 NOTE — Telephone Encounter (Signed)
I called and spoke with Jane Gutierrez regarding her CT chest/abdomen/pelvis results.  I let her know that there were no suspicious findings concerning for malignancy or metastatic disease on her CT scan.  She was very grateful for my call.  She is planning to have follow-up with her PCP regarding the continued stable appearance of the left adrenal gland enlargement that resembles hyperplasia, per radiology.    I will let Dr. Lindi Adie know the results of her scan and he can arrange follow-up as he wishes.    Mike Craze, NP [place] [phone]

## 2015-07-12 ENCOUNTER — Ambulatory Visit
Admission: RE | Admit: 2015-07-12 | Discharge: 2015-07-12 | Disposition: A | Discharge status: Home / Self Care | Payer: 59 | Source: Ambulatory Visit | Attending: Obstetrics & Gynecology | Admitting: Obstetrics & Gynecology

## 2015-07-12 DIAGNOSIS — Z853 Personal history of malignant neoplasm of breast: Secondary | ICD-10-CM

## 2015-07-12 MED ORDER — GADOBENATE DIMEGLUMINE 529 MG/ML IV SOLN
13.0000 mL | Freq: Once | INTRAVENOUS | Status: AC | PRN
  Administered 2015-07-12: 13 mL via INTRAVENOUS

## 2015-07-28 ENCOUNTER — Ambulatory Visit (INDEPENDENT_AMBULATORY_CARE_PROVIDER_SITE_OTHER): Payer: 59 | Admitting: Family Medicine

## 2015-07-28 VITALS — BP 110/80 | HR 76 | Temp 97.8°F | Resp 16 | Ht 64.0 in | Wt 139.0 lb

## 2015-07-28 DIAGNOSIS — J029 Acute pharyngitis, unspecified: Secondary | ICD-10-CM

## 2015-07-28 LAB — POCT RAPID STREP A (OFFICE): Rapid Strep A Screen: NEGATIVE

## 2015-07-28 MED ORDER — AZITHROMYCIN 250 MG PO TABS: ORAL_TABLET | ORAL | Status: DC

## 2015-07-28 NOTE — Patient Instructions (Signed)
I think that you likely have a viral infection- I Gearline that you will get better in the next few days.   However if you do not you can start and use the azithromycin  Let me know if you have any concerns

## 2015-07-28 NOTE — Progress Notes (Signed)
[facility] [address], [place] [address] [phone]  Date:  07/28/2015   Name:  Jane Gutierrez   DOB:  07-11-1956   MRN:  [id number]  PCP:  Sheela Stack, MD    Chief Complaint: Sinus Problem; Headache; Sore Throat; Cough; and pressure behind ear   History of Present Illness:  Jane Gutierrez is a 59 y.o. very pleasant female patient who presents with the following:  She has noted some pressure behind her right ear and other URI sx as below.  She has noted sx for about 3 days. She is worried because when she gets sick it "will turn into laryngitis, and then go into my chest."  Sinus ha, low grade fever this am, pain when she swallows She has noted a mild cough She notes sinus drainage and dripping.  It is clear.  She tried some claritin She has been swimming recently.   No GI symptoms Her hearing seems to be fine- no change here    Patient Active Problem List   Diagnosis Date Noted  . Bilateral breast cancer 06/28/2015  . Right hip pain 09/19/2014  . Right knee pain 09/19/2014  . Dyspnea on exertion 08/08/2014  . Heart palpitations 08/08/2014  . Acquired absence of breast and absent nipple 03/09/2014  . Pre-operative cardiovascular examination 09/09/2013  . Other chronic pulmonary heart diseases 05/27/2013  . SIRS (systemic inflammatory response syndrome) 04/14/2013  . Anemia 04/14/2013  . Dehydration 04/14/2013  . Fever 04/14/2013  . Allergy   . IBS (irritable bowel syndrome)   . Thyroid disease   . ADD (attention deficit disorder)   . Lyme disease   . Asthma   . Hypothyroidism   . Contact lens/glasses fitting     Past Medical History  Diagnosis Date  . IBS (irritable bowel syndrome)   . ADD (attention deficit disorder)   . Hypothyroidism   . History of breast cancer   . History of cervical cancer   . History of palpitations     last echo 08/22/2014  . Abnormally small mouth   . Dental crowns present   . Difficulty swallowing  pills   . Asthma     prn inhaler  . Blood transfusion without reported diagnosis   . Allergy     Past Surgical History  Procedure Laterality Date  . Brain surgery      removal benign frontal lobe cyst in 1987  . Foot surgery Bilateral 07/2012  . Gynecologic cryosurgery  1998  . Breast enhancement surgery Bilateral ~ 2008  . Facial cosmetic surgery  ~ 2010  . Axillary sentinel node biopsy  12/31/2012    Procedure: AXILLARY SENTINEL NODE BIOPSY;  Surgeon: Rolm Bookbinder, MD;  Location: [place];  Service: General;  Laterality: Bilateral;  bilateral sentinel node  . Total mastectomy  12/31/2012    Procedure: TOTAL MASTECTOMY;  Surgeon: Rolm Bookbinder, MD;  Location: [place];  Service: General;  Laterality: Bilateral;  bilateral total mastectomies  . Breast reconstruction with placement of tissue expander and flex hd (acellular hydrated dermis)  12/31/2012    Procedure: BREAST RECONSTRUCTION WITH PLACEMENT OF TISSUE EXPANDER AND FLEX HD (ACELLULAR HYDRATED DERMIS);  Surgeon: Theodoro Kos, DO;  Location: [place];  Service: Plastics;  Laterality: Bilateral;  bilateral immediate breast reconstruction with expanders and flex hd   . Portacath placement  01/27/2013    Procedure: INSERTION PORT-A-CATH;  Surgeon: Rolm Bookbinder, MD;  Location:  [place];  Service: General;  Laterality: Right;  . Incision and drainage of wound Left 04/28/2013    Procedure: IRRIGATION AND DEBRIDEMENT LEFT BREAST WOUND, POSSIBLE CLOSURE OF WOUND WITH DRAIN PLACEMENT;  Surgeon: Theodoro Kos, DO;  Location: [place];  Service: Plastics;  Laterality: Left;  . Tee without cardioversion N/A 06/04/2013    Procedure: TRANSESOPHAGEAL ECHOCARDIOGRAM (TEE);  Surgeon: Jolaine Artist, MD;  Location: [facility];  Service: Cardiovascular;  Laterality: N/A;  . Tissue expander placement Left 06/16/2013    Procedure: LEFT BREAST TISSUE EXPANDER  REMOVAL;  Surgeon: Theodoro Kos, DO;  Location: [place];  Service: Plastics;  Laterality: Left;  . Dilation and curettage of uterus  2005  . Cervical cerclage    . Latissimus flap to breast Left 09/29/2013    Procedure: LATISSIMUS FLAP TO BREAST WITH TISSUE EXPANDER PLACEMENT FOR LEFT BREAST RECONSTRUCTION;  Surgeon: Theodoro Kos, DO;  Location: [place];  Service: Plastics;  Laterality: Left;  . Tissue expander placement Left 09/29/2013    Procedure: TISSUE EXPANDER;  Surgeon: Theodoro Kos, DO;  Location: [place];  Service: Plastics;  Laterality: Left;  . Removal of bilateral tissue expanders with placement of bilateral breast implants Bilateral 03/09/2014    Procedure: REMOVAL OF BILATERAL TISSUE EXPANDERS WITH PLACEMENT OF BILATERAL BREAST IMPLANTS;  Surgeon: Theodoro Kos, DO;  Location: [place];  Service: Plastics;  Laterality: Bilateral;  . Liposuction Bilateral 03/09/2014    Procedure: LIPOSUCTION ABDOMEN W/ LIPOFILLING LATERAL BILATERAL BREAST;  Surgeon: Theodoro Kos, DO;  Location: [place];  Service: Plastics;  Laterality: Bilateral;  LIPOSUCTION NECK  . Port-a-cath removal Right 03/09/2014    Procedure: REMOVAL PORT-A-CATH;  Surgeon: Theodoro Kos, DO;  Location: [place];  Service: Plastics;  Laterality: Right;  . Total abdominal hysterectomy w/ bilateral salpingoophorectomy  03/28/2004  . Lysis of adhesion  03/28/2004    pelvic  . Abdominoplasty  03/28/2004  . Lipectomy Bilateral 03/28/2004    hip  . Endometrial fulguration  05/12/2003  . Radiofrequency ablation nerves  05/12/2003    uterosacral nerve ablation  . Diagnostic laparoscopy  05/12/2003    right partial salpingectomy; lysis paraovarian adhesions left  . Capsulotomy Left 09/01/2014    Procedure: CAPSULOTOMY OF LEFT BREAST WITH LIPOFILLING FOR SYMMETRY;  Surgeon: Theodoro Kos, DO;  Location: [place];  Service: Plastics;  Laterality: Left;  .  Liposuction with lipofilling Bilateral 09/01/2014    Procedure: LIPOSUCTION WITH LIPOFILLING;  Surgeon: Theodoro Kos, DO;  Location: [place];  Service: Plastics;  Laterality: Bilateral;  . Abdominal hysterectomy      History  Substance Use Topics  . Smoking status: Never Smoker   . Smokeless tobacco: Never Used  . Alcohol Use: 0.0 oz/week    0 Standard drinks or equivalent per week     Comment: occasionally    Family History  Problem Relation Age of Onset  . Cancer Mother 6    uterine, mult myeloma  . CVA Father   . Cancer Sister 76    breast  . Cancer Maternal Grandmother 62    breast    Allergies  Allergen Reactions  . Betadine [Povidone Iodine] Anaphylaxis  . Iodine Anaphylaxis  . Shellfish-Derived Products Anaphylaxis  . Cleocin [Clindamycin Hcl] Rash  . Ginger Rash  . Penicillins Rash  . Sulfamethoxazole-Trimethoprim Rash    Medication list has been reviewed and updated.  Current Outpatient Prescriptions on File Prior to  Visit  Medication Sig Dispense Refill  . aspirin 81 MG tablet Take 81 mg by mouth daily.    . B Complex-C (B-COMPLEX WITH VITAMIN C) tablet Take 1 tablet by mouth daily.    [name] dextroamphetamine (DEXTROSTAT) 5 MG tablet Take 2.5 mg by mouth.     . EPINEPHrine (EPIPEN) 0.3 mg/0.3 mL DEVI Inject 0.3 mg into the muscle as needed (for allergic reaction).     . Eszopiclone 3 MG TABS TAKE 1 TABLET BY MOUTH IMMEDIATELY BEFORE BEDTIME 30 tablet 3  . hyoscyamine (ANASPAZ) 0.125 MG TBDP disintergrating tablet Place 0.125 mg under the tongue as needed.     [name] SYNTHROID 100 MCG tablet TAKE 1 TABLET BY MOUTH DAILY. 30 tablet 4  . busPIRone (BUSPAR) 10 MG tablet Take 1 tablet (10 mg total) by mouth at bedtime as needed. 30 tablet 2  . LORazepam (ATIVAN) 1 MG tablet TAKE 1 TABLET BY MOUTH EVERY 8 HOURS AS NEEDED FOR ANXIETY 60 tablet 0  . Multiple Vitamin (MULTIVITAMIN WITH MINERALS) TABS Take 1 tablet by mouth daily.    . vitamin C (ASCORBIC  ACID) 500 MG tablet Take 500 mg by mouth daily.    . vitamin E (VITAMIN E) 400 UNIT capsule Take 800 Units by mouth daily.     No current facility-administered medications on file prior to visit.    Review of Systems:  As per HPI- otherwise negative.   Physical Examination: Filed Vitals:   07/28/15 1518  BP: 110/80  Pulse: 76  Temp: 97.8 F (36.6 C)  Resp: 16   Filed Vitals:   07/28/15 1518  Height: 5\' 4"  (1.626 m)  Weight: 139 lb (63.05 kg)   Body mass index is 23.85 kg/(m^2). Ideal Body Weight: Weight in (lb) to have BMI = 25: 145.3  GEN: WDWN, NAD, Non-toxic, A & O x 3, looks well HEENT: Atraumatic, Normocephalic. Neck supple. No masses, No LAD.  Bilateral TM wnl, oropharynx injected but no exudate.  PEERL,EOMI.   Ears and Nose: No external deformity. CV: RRR, No M/G/R. No JVD. No thrill. No extra heart sounds. PULM: CTA B, no wheezes, crackles, rhonchi. No retractions. No resp. distress. No accessory muscle use. EXTR: No c/c/e NEURO Normal gait.  PSYCH: Normally interactive. Conversant. Not depressed or anxious appearing.  Calm demeanor.   Results for orders placed or performed in visit on 07/28/15  POCT rapid strep A  Result Value Ref Range   Rapid Strep A Screen Negative Negative    Assessment and Plan: Acute pharyngitis, unspecified pharyngitis type - Plan: POCT rapid strep A, azithromycin (ZITHROMAX) 250 MG tablet  Likely viral uri.  She is ok with taking a zpack to go and hold in case she does not get better soon- she will let me know if any concerns   Signed Lamar Blinks, MD

## 2015-08-17 DIAGNOSIS — R634 Abnormal weight loss: Secondary | ICD-10-CM | POA: Insufficient documentation

## 2015-08-17 DIAGNOSIS — M2550 Pain in unspecified joint: Secondary | ICD-10-CM | POA: Insufficient documentation

## 2015-08-17 DIAGNOSIS — L659 Nonscarring hair loss, unspecified: Secondary | ICD-10-CM | POA: Insufficient documentation

## 2015-08-17 DIAGNOSIS — R5383 Other fatigue: Secondary | ICD-10-CM | POA: Insufficient documentation

## 2015-09-11 ENCOUNTER — Encounter: Payer: Self-pay | Admitting: Pulmonary Disease

## 2015-09-11 ENCOUNTER — Ambulatory Visit (INDEPENDENT_AMBULATORY_CARE_PROVIDER_SITE_OTHER): Payer: 59 | Admitting: Pulmonary Disease

## 2015-09-11 VITALS — BP 124/68 | HR 86 | Ht 64.0 in | Wt 141.0 lb

## 2015-09-11 DIAGNOSIS — R0600 Dyspnea, unspecified: Secondary | ICD-10-CM | POA: Insufficient documentation

## 2015-09-11 DIAGNOSIS — R0609 Other forms of dyspnea: Secondary | ICD-10-CM

## 2015-09-11 MED ORDER — EPINEPHRINE 0.3 MG/0.3ML IJ SOAJ: 0.3000 mg | Freq: Once | INTRAMUSCULAR | Status: DC

## 2015-09-11 MED ORDER — LEVALBUTEROL TARTRATE 45 MCG/ACT IN AERO: 2.0000 | INHALATION_SPRAY | Freq: Four times a day (QID) | RESPIRATORY_TRACT | Status: DC | PRN

## 2015-09-11 NOTE — Assessment & Plan Note (Signed)
Jane Gutierrez is here to see me today because she has been experiencing some shortness of breath in the last 6 months. She's had extensive treatment for her breast cancer including multiple surgeries as well as chemotherapy. Her treatment course was complicated by severe sepsis. A year ago she was evaluated in the advanced heart failure clinic and fortunately there is no evidence of heart failure or pulmonary hypertension seen. It sounds as if she has somewhat unpredictable dyspnea only on exertion. On exam she has normal lungs, and a normal cardiac exam. Her ambulatory oximetry was completely normal. I have personally reviewed the images from her CT chest from July 2016 see no evidence of underlying lung disease.  There is no evidence of neuromuscular weakness and no clear evidence of lung disease. She tells me that her hemoglobin level checked a week ago was normal but I cannot see evidence of this.  I explained to her that the most likely explanation for her dyspnea is just ongoing fatigue related to her intense cancer treatment. However, we will get pulmonic function testing to ensure there is nothing else going on. She may have exercise-induced bronchospasm but this can be somewhat problematic to diagnose.  Plan: Full pulmonary function testing Use Xopenex before exercise in case she has exercise-induced bronchospasm.

## 2015-09-11 NOTE — Patient Instructions (Signed)
We will call you with the results of the lung function testing Use Xopenex 2 puffs 10 minutes before exercise We will see you back at the lung function testing is abnormal.

## 2015-09-11 NOTE — Progress Notes (Signed)
Subjective:    Patient ID: Jane Gutierrez, female    DOB: 1956/07/30, 59 y.o.   MRN: [id number]  HPI Chief Complaint  Patient presents with  . Advice Only    Referred by Dr. Forde Dandy for increased fatigue, SOB X4 mos.     This is a pleasant lady who comes to my clinic today for evaluation of shortness of breath. She has an extensive history of breast cancer and was given chemotherapy as well as surgery for treatment of the same. Her treatment course was complicated by severe sepsis and a few wound infection issues. However, she has recovered fairly well since then. She said that by January of this year she was feeling quite well and was back to her regular exercise routine. Around that time she lost a fair amount of weight and she had some hair loss. She said that she started feeling more fatigue and dyspnea around this time. Despite this she has remained very active and continues to exercise routinely. She goes to spin class III times a week. She says that sometimes she can get through 10 minutes before she gets short of breath, other times she can go as long as 45 minutes without having shortness of breath. She'll note the sensation of an upper airway occlusion when she gets short of breath. She denies chest pain during this time but she says sometimes she may get nauseated when exerting herself. She does have allergy symptoms and in the past she's had some mild wheezing when the pollen was out. This is typically associated with sinus congestion. She has not had leg swelling or new neuromuscular weakness. Specifically, she says she has not dropped anything, though she does note that her right side seems to be a bit weaker than the left when she exercises and an extreme level. She uses albuterol from time to time when she has wheezing but she has not been using it lately. It does help with shortness breath when she has it. She has not tried using it before exercise.   Past Medical History  Diagnosis Date    . IBS (irritable bowel syndrome)   . ADD (attention deficit disorder)   . Hypothyroidism   . History of breast cancer   . History of cervical cancer   . History of palpitations     last echo 08/22/2014  . Abnormally small mouth   . Dental crowns present   . Difficulty swallowing pills   . Asthma     prn inhaler  . Blood transfusion without reported diagnosis   . Allergy      Family History  Problem Relation Age of Onset  . Cancer Mother 44    uterine, mult myeloma  . CVA Father   . Cancer Sister 74    breast  . Cancer Maternal Grandmother 63    breast  . Lupus Sister     twin sister  . Sarcoidosis Sister     twin sister  . Rheum arthritis Sister     twin sister  . Celiac disease Daughter      Social History   Social History  . Marital Status: Widowed    Spouse Name: N/A  . Number of Children: N/A  . Years of Education: N/A   Occupational History  . Not on file.   Social History Main Topics  . Smoking status: Never Smoker   . Smokeless tobacco: Never Used  . Alcohol Use: 0.0 oz/week    0 Standard  drinks or equivalent per week     Comment: occasionally  . Drug Use: No  . Sexual Activity: Yes   Other Topics Concern  . Not on file   Social History Narrative     Allergies  Allergen Reactions  . Betadine [Povidone Iodine] Anaphylaxis  . Iodine Anaphylaxis  . Shellfish-Derived Products Anaphylaxis  . Cleocin [Clindamycin Hcl] Rash  . Ginger Rash  . Penicillins Rash  . Sulfamethoxazole-Trimethoprim Rash     Outpatient Prescriptions Prior to Visit  Medication Sig Dispense Refill  . aspirin 81 MG tablet Take 81 mg by mouth daily.    . B Complex-C (B-COMPLEX WITH VITAMIN C) tablet Take 1 tablet by mouth daily.    [name] dextroamphetamine (DEXTROSTAT) 5 MG tablet Take 5 mg by mouth 4 (four) times daily as needed.     . Eszopiclone 3 MG TABS TAKE 1 TABLET BY MOUTH IMMEDIATELY BEFORE BEDTIME 30 tablet 3  . hyoscyamine (ANASPAZ) 0.125 MG TBDP disintergrating  tablet Place 0.125 mg under the tongue as needed.     . Multiple Vitamin (MULTIVITAMIN WITH MINERALS) TABS Take 1 tablet by mouth daily.    [name] SYNTHROID 100 MCG tablet TAKE 1 TABLET BY MOUTH DAILY. 30 tablet 4  . valACYclovir (VALTREX) 500 MG tablet Take 250 mg by mouth daily.    . vitamin C (ASCORBIC ACID) 500 MG tablet Take 500 mg by mouth daily.    . vitamin E (VITAMIN E) 400 UNIT capsule Take 800 Units by mouth daily.    [name] EPINEPHrine (EPIPEN) 0.3 mg/0.3 mL DEVI Inject 0.3 mg into the muscle as needed (for allergic reaction).     [name] azithromycin (ZITHROMAX) 250 MG tablet Use as a zpack (Patient not taking: Reported on 09/11/2015) 6 tablet 0  . busPIRone (BUSPAR) 10 MG tablet Take 1 tablet (10 mg total) by mouth at bedtime as needed. (Patient not taking: Reported on 09/11/2015) 30 tablet 2  . LORazepam (ATIVAN) 1 MG tablet TAKE 1 TABLET BY MOUTH EVERY 8 HOURS AS NEEDED FOR ANXIETY (Patient not taking: Reported on 09/11/2015) 60 tablet 0   No facility-administered medications prior to visit.       Review of Systems  Constitutional: Positive for fatigue. Negative for fever and unexpected weight change.  HENT: Positive for congestion. Negative for dental problem, ear pain, nosebleeds, postnasal drip, rhinorrhea, sinus pressure, sneezing, sore throat and trouble swallowing.   Eyes: Negative for redness and itching.  Respiratory: Positive for cough and shortness of breath. Negative for chest tightness and wheezing.   Cardiovascular: Negative for palpitations and leg swelling.  Gastrointestinal: Negative for nausea and vomiting.  Genitourinary: Negative for dysuria.  Musculoskeletal: Negative for joint swelling.  Skin: Negative for rash.  Neurological: Negative for headaches.  Hematological: Does not bruise/bleed easily.  Psychiatric/Behavioral: Negative for dysphoric mood. The patient is not nervous/anxious.        Objective:   Physical Exam Filed Vitals:   09/11/15 1615  BP: 124/68    Pulse: 86  Height: 5\' 4"  (1.626 m)  Weight: 141 lb (63.957 kg)  SpO2: 98%  RA  Gen: well appearing, no acute distress HENT: NCAT, OP clear, neck supple without masses Eyes: PERRL, EOMi Lymph: no cervical lymphadenopathy PULM: CTA B CV: RRR, no mgr, no JVD GI: BS+, soft, nontender, no hsm Derm: no rash or skin breakdown MSK: normal bulk and tone Neuro: A&Ox4, CN II-XII intact, strength 5/5 in all 4 extremities Psyche: normal mood and affect   July 2016 CT  scan of the chest personally reviewed> normal pulmonary parenchyma, normal airways, normal cardiac August 2015 echocardiogram normal LVEF, normal size and function of the LV, and are mildly dilated       Assessment & Plan:  Dyspnea Jane Gutierrez is here to see me today because she has been experiencing some shortness of breath in the last 6 months. She's had extensive treatment for her breast cancer including multiple surgeries as well as chemotherapy. Her treatment course was complicated by severe sepsis. A year ago she was evaluated in the advanced heart failure clinic and fortunately there is no evidence of heart failure or pulmonary hypertension seen. It sounds as if she has somewhat unpredictable dyspnea only on exertion. On exam she has normal lungs, and a normal cardiac exam. Her ambulatory oximetry was completely normal. I have personally reviewed the images from her CT chest from July 2016 see no evidence of underlying lung disease.  There is no evidence of neuromuscular weakness and no clear evidence of lung disease. She tells me that her hemoglobin level checked a week ago was normal but I cannot see evidence of this.  I explained to her that the most likely explanation for her dyspnea is just ongoing fatigue related to her intense cancer treatment. However, we will get pulmonic function testing to ensure there is nothing else going on. She may have exercise-induced bronchospasm but this can be somewhat problematic to  diagnose.  Plan: Full pulmonary function testing Use Xopenex before exercise in case she has exercise-induced bronchospasm.     Current outpatient prescriptions:  .  albuterol (PROVENTIL HFA;VENTOLIN HFA) 108 (90 BASE) MCG/ACT inhaler, Inhale 1-2 puffs into the lungs every 6 (six) hours as needed for wheezing or shortness of breath., Disp: , Rfl:  .  aspirin 81 MG tablet, Take 81 mg by mouth daily., Disp: , Rfl:  .  B Complex-C (B-COMPLEX WITH VITAMIN C) tablet, Take 1 tablet by mouth daily., Disp: , Rfl:  .  Biotin 1 MG CAPS, Take 1 capsule by mouth daily., Disp: , Rfl:  .  dextroamphetamine (DEXTROSTAT) 5 MG tablet, Take 5 mg by mouth 4 (four) times daily as needed. , Disp: , Rfl:  .  Eszopiclone 3 MG TABS, TAKE 1 TABLET BY MOUTH IMMEDIATELY BEFORE BEDTIME, Disp: 30 tablet, Rfl: 3 .  hyoscyamine (ANASPAZ) 0.125 MG TBDP disintergrating tablet, Place 0.125 mg under the tongue as needed. , Disp: , Rfl:  .  Multiple Vitamin (MULTIVITAMIN WITH MINERALS) TABS, Take 1 tablet by mouth daily., Disp: , Rfl:  .  SYNTHROID 100 MCG tablet, TAKE 1 TABLET BY MOUTH DAILY., Disp: 30 tablet, Rfl: 4 .  valACYclovir (VALTREX) 500 MG tablet, Take 250 mg by mouth daily., Disp: , Rfl:  .  vitamin C (ASCORBIC ACID) 500 MG tablet, Take 500 mg by mouth daily., Disp: , Rfl:  .  vitamin E (VITAMIN E) 400 UNIT capsule, Take 800 Units by mouth daily., Disp: , Rfl:  .  EPINEPHrine 0.3 mg/0.3 mL IJ SOAJ injection, Inject 0.3 mLs (0.3 mg total) into the muscle once., Disp: 1 Device, Rfl: 5 .  levalbuterol (XOPENEX HFA) 45 MCG/ACT inhaler, Inhale 2 puffs into the lungs every 6 (six) hours as needed for wheezing., Disp: 1 Inhaler, Rfl: 12

## 2015-09-12 ENCOUNTER — Other Ambulatory Visit: Payer: Self-pay | Admitting: Hematology and Oncology

## 2015-09-12 NOTE — Telephone Encounter (Signed)
Last ov 12/05/14.  Next OV 12/07/15.  Chart reviewed

## 2015-09-13 ENCOUNTER — Other Ambulatory Visit: Payer: Self-pay

## 2015-09-19 ENCOUNTER — Ambulatory Visit (HOSPITAL_COMMUNITY)
Admission: RE | Admit: 2015-09-19 | Discharge: 2015-09-19 | Disposition: A | Discharge status: Home / Self Care | Payer: 59 | Source: Ambulatory Visit | Attending: Pulmonary Disease | Admitting: Pulmonary Disease

## 2015-09-19 ENCOUNTER — Telehealth: Payer: Self-pay | Admitting: Pulmonary Disease

## 2015-09-19 DIAGNOSIS — R0609 Other forms of dyspnea: Secondary | ICD-10-CM | POA: Insufficient documentation

## 2015-09-19 DIAGNOSIS — R0600 Dyspnea, unspecified: Secondary | ICD-10-CM

## 2015-09-19 LAB — PULMONARY FUNCTION TEST
DL/VA % pred: 110 %
DL/VA: 5.43 ml/min/mmHg/L
DLCO UNC % PRED: 94 %
DLCO UNC: 24.32 ml/min/mmHg
FEF 25-75 Post: 3.38 L/sec
FEF 25-75 Pre: 3.26 L/sec
FEF2575-%Change-Post: 3 %
FEF2575-%Pred-Post: 137 %
FEF2575-%Pred-Pre: 132 %
FEV1-%CHANGE-POST: 0 %
FEV1-%PRED-POST: 88 %
FEV1-%Pred-Pre: 87 %
FEV1-POST: 2.36 L
FEV1-Pre: 2.34 L
FEV1FVC-%Change-Post: -2 %
FEV1FVC-%Pred-Pre: 112 %
FEV6-%Change-Post: 4 %
FEV6-%PRED-POST: 82 %
FEV6-%Pred-Pre: 79 %
FEV6-PRE: 2.65 L
FEV6-Post: 2.75 L
FEV6FVC-%PRED-POST: 103 %
FEV6FVC-%PRED-PRE: 103 %
FVC-%CHANGE-POST: 3 %
FVC-%Pred-Post: 79 %
FVC-%Pred-Pre: 76 %
FVC-Post: 2.75 L
FVC-Pre: 2.66 L
PRE FEV6/FVC RATIO: 100 %
Post FEV1/FVC ratio: 86 %
Post FEV6/FVC ratio: 100 %
Pre FEV1/FVC ratio: 88 %
RV % PRED: 104 %
RV: 2.1 L
TLC % PRED: 97 %
TLC: 5.09 L

## 2015-09-19 MED ORDER — ALBUTEROL SULFATE (2.5 MG/3ML) 0.083% IN NEBU
2.5000 mg | INHALATION_SOLUTION | Freq: Once | RESPIRATORY_TRACT | Status: AC
  Administered 2015-09-19: 2.5 mg via RESPIRATORY_TRACT

## 2015-09-19 NOTE — Telephone Encounter (Signed)
Per PFT results: Please let the patient know this was OK ---  I spoke with patient about results and she verbalized understanding and had no questions

## 2015-10-23 ENCOUNTER — Encounter: Payer: Self-pay | Admitting: Adult Health

## 2015-10-23 NOTE — Progress Notes (Signed)
A birthday card was mailed to the patient today on behalf of the Survivorship Program at [facility].   Sadira Standard, NP Survivorship Program [facility] [phone]  

## 2015-12-07 ENCOUNTER — Telehealth: Payer: Self-pay | Admitting: Hematology and Oncology

## 2015-12-07 ENCOUNTER — Encounter: Payer: Self-pay | Admitting: Hematology and Oncology

## 2015-12-07 ENCOUNTER — Ambulatory Visit (HOSPITAL_BASED_OUTPATIENT_CLINIC_OR_DEPARTMENT_OTHER): Payer: 59 | Admitting: Hematology and Oncology

## 2015-12-07 VITALS — BP 140/77 | HR 65 | Resp 18 | Ht 65.0 in | Wt 142.5 lb

## 2015-12-07 DIAGNOSIS — Z170 Estrogen receptor positive status [ER+]: Secondary | ICD-10-CM | POA: Diagnosis not present

## 2015-12-07 DIAGNOSIS — Z9882 Breast implant status: Secondary | ICD-10-CM

## 2015-12-07 DIAGNOSIS — C50412 Malignant neoplasm of upper-outer quadrant of left female breast: Secondary | ICD-10-CM

## 2015-12-07 DIAGNOSIS — Z853 Personal history of malignant neoplasm of breast: Secondary | ICD-10-CM | POA: Diagnosis not present

## 2015-12-07 MED ORDER — LEVALBUTEROL TARTRATE 45 MCG/ACT IN AERO: 2.0000 | INHALATION_SPRAY | Freq: Four times a day (QID) | RESPIRATORY_TRACT | Status: DC | PRN

## 2015-12-07 NOTE — Addendum Note (Signed)
Addended by: Prentiss Bells on: 12/07/2015 12:11 PM   Modules accepted: Orders, Medications

## 2015-12-07 NOTE — Progress Notes (Signed)
[name], MD as PCP - General (Endocrinology)  DIAGNOSIS: Breast cancer of upper-outer quadrant of left female breast [facility])   Staging form: Breast, AJCC 7th Edition     Clinical: Stage 0 (Tis (DCIS), N0, M0) - Unsigned     Pathologic: Stage IIA (T2, N0, cM0) - Unsigned     Clinical: Stage IA (T1b, N0, M0) - Unsigned     Pathologic: T0 - Unsigned   SUMMARY OF ONCOLOGIC HISTORY:   Breast cancer, left breast ([place]) (Resolved)    Initial Diagnosis Breast cancer, left breast   12/04/2012 Initial Biopsy Left breast needle core biopsy (UOQ): Grade 3, DCIS with necrosis and calcs. ER+ (100%), PR+ (86%).     Breast cancer of upper-outer quadrant of left female breast ([place])   12/04/2012 Initial Biopsy LEFT breast needle core biopsy (UOQ): Grade 3, DCIS with necrosis and calcs. ER+ (100%), PR+ (86%).    12/14/2012 Breast MRI LEFT breast: Clumped nodular enhancement measuring 7.2 x 3.2 x 2.6 cm. Also associated post-biopsy change. RIGHT breast: At 12 o'clock location, there is 60m slightly irregular mass-like area of abnml enhancement. No lymphadenopathy.    12/25/2012 Initial Biopsy RIGHT breast needle core biopsy (upper centeral): Grade 1-2, IDC, DCIS with calcs. ER+ (100%), PR+ (100%), HER2- (ratio 1.23). Ki67 4%.    12/25/2012 Initial Diagnosis Bilateral breast cancer   12/31/2012 Surgery Bilateral mastectomies with bilat SLNB ([facility]. LEFT: Grade 2, IDC, 2.3 cm. (+) LVI. Grade 3 DCIS w/comedonecrosis & calcs. (-) margins. 1 left ax SLN neg.  ER+ (99%), PR+ (10%), HER2 repeated and (+) ratio (6.81). Ki67 46%. RIGHT: Benign, 1 SLN neg.   12/31/2012 Pathologic Stage LEFT breast: pT2, pN0, pMx. Stage IIA.    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 EF 55-60%   02/04/2013 - 05/13/2013 Adjuvant Chemotherapy Taxotere/Carbo/Herceptin q 3 wks x 5 cycles completed. (planned for 6 cycles but d/c'd  early due to pt wishes and recent infection).    05/2013 -  Anti-estrogen oral therapy Attempted to take Letrozole, Aromasin, & Tamoxifen but patient could not tolerate any anti-estrogen therapy due to severe depressive symptoms and joint/muscle aches & pains.  No longer taking anti-estrogen therapy.   09/29/2013 Surgery LEFT breast reconstruction with lat flap and placement of tissue expander ([facility]).    11/26/2013 Imaging CT chest: No definite CT findings for chest wall recurrence and no suspicious supraclavicular or axillary adenopathy.  Negative for metastatic disease in lungs or bones.     - 02/16/2014 Adjuvant Chemotherapy Herceptin maintenance x 1 year completed.    03/09/2014 Surgery Removal of bilateral tissue expanders and placement of bilateral breast implants ([facility]).    03/09/2014 Procedure Right scar biopsy: No malignancy & Left breast capsule biopsy: No evidence of malignancy.    08/22/2014 Echocardiogram Post-treatment. EF 55-60%   06/30/2015 Survivorship Survivorship Care Plan given to patient and reviewed with her during in-person visit.     CHIEF COMPLIANT: areas of fat necrosis  INTERVAL HISTORY: Jane HANINGis a 59year old with above-mentioned history of bilateral breast implants after having had bilateral mastectomies. She is here for annual follow-up. She had a biopsy of right breast area which felt nodular. It came back benign. She had breast MRI in July 2016 and ultrasounds March 2016 and CT chest abdomen pelvis July 2016 which were all normal. She stays very active because of her boyfriend who is an avid [employer] manager She had  lost 26 pounds by staying active.  REVIEW OF SYSTEMS:   Constitutional: Denies fevers, chills or abnormal weight loss Eyes: Denies blurriness of vision Ears, nose, mouth, throat, and face: Denies mucositis or sore throat Respiratory: Denies cough, dyspnea or wheezes Cardiovascular: Denies palpitation, chest discomfort or lower  extremity swelling Gastrointestinal:  Denies nausea, heartburn or change in bowel habits Skin: Denies abnormal skin rashes Lymphatics: Denies new lymphadenopathy or easy bruising Neurological:Denies numbness, tingling or new weaknesses Behavioral/Psych: Mood is stable, no new changes  Breast:nodularity in upper and medial aspect of both breasts All other systems were reviewed with the patient and are negative.  I have reviewed the past medical history, past surgical history, social history and family history with the patient and they are unchanged from previous note.  ALLERGIES:  is allergic to betadine; iodine; shellfish-derived products; cleocin; ginger; penicillins; and sulfamethoxazole-trimethoprim.  MEDICATIONS:  Current Outpatient Prescriptions  Medication Sig Dispense Refill  . albuterol (PROVENTIL HFA;VENTOLIN HFA) 108 (90 BASE) MCG/ACT inhaler Inhale 1-2 puffs into the lungs every 6 (six) hours as needed for wheezing or shortness of breath.    [name] aspirin 81 MG tablet Take 81 mg by mouth daily.    . B Complex-C (B-COMPLEX WITH VITAMIN C) tablet Take 1 tablet by mouth daily.    . Biotin 1 MG CAPS Take 1 capsule by mouth daily.    [name] dextroamphetamine (DEXTROSTAT) 5 MG tablet Take 5 mg by mouth 4 (four) times daily as needed.     [name] EPINEPHrine 0.3 mg/0.3 mL IJ SOAJ injection Inject 0.3 mLs (0.3 mg total) into the muscle once. 1 Device 5  . Eszopiclone 3 MG TABS TAKE 1 TABLET BY MOUTH IMMEDIATELY BEFORE BEDTIME 30 tablet 3  . hyoscyamine (ANASPAZ) 0.125 MG TBDP disintergrating tablet Place 0.125 mg under the tongue as needed.     . levalbuterol (XOPENEX HFA) 45 MCG/ACT inhaler Inhale 2 puffs into the lungs every 6 (six) hours as needed for wheezing. 1 Inhaler 12  . Multiple Vitamin (MULTIVITAMIN WITH MINERALS) TABS Take 1 tablet by mouth daily.    [name] SYNTHROID 100 MCG tablet TAKE 1 TABLET BY MOUTH DAILY. 30 tablet 4  . valACYclovir (VALTREX) 500 MG tablet Take 250 mg by mouth daily.      . vitamin C (ASCORBIC ACID) 500 MG tablet Take 500 mg by mouth daily.    . vitamin E (VITAMIN E) 400 UNIT capsule Take 800 Units by mouth daily.     No current facility-administered medications for this visit.    PHYSICAL EXAMINATION: ECOG PERFORMANCE STATUS: 0 - Asymptomatic  Filed Vitals:   12/07/15 1140  BP: 140/77  Pulse: 65  Resp: 18   Filed Weights   12/07/15 1140  Weight: 142 lb 8 oz (64.638 kg)    GENERAL:alert, no distress and comfortable SKIN: skin color, texture, turgor are normal, no rashes or significant lesions EYES: normal, Conjunctiva are pink and non-injected, sclera clear OROPHARYNX:no exudate, no erythema and lips, buccal mucosa, and tongue normal  NECK: supple, thyroid normal size, non-tender, without nodularity LYMPH:  no palpable lymphadenopathy in the cervical, axillary or inguinal LUNGS: clear to auscultation and percussion with normal breathing effort HEART: regular rate & rhythm and no murmurs and no lower extremity edema ABDOMEN:abdomen soft, non-tender and normal bowel sounds Musculoskeletal:no cyanosis of digits and no clubbing  NEURO: alert & oriented x 3 with fluent speech, no focal motor/sensory deficits BREAST:palpable nodularity in the superior medial aspect of bilateral breasts. (exam performed in the  presence of a chaperone)  LABORATORY DATA:  I have reviewed the data as listed   Chemistry      Component Value Date/Time   NA 140 11/28/2014 0813   NA 137 09/21/2013 0842   K 4.9 11/28/2014 0813   K 4.4 09/21/2013 0842   CL 101 09/21/2013 0842   CL 104 06/10/2013 1312   CO2 27 11/28/2014 0813   CO2 25 09/21/2013 0842   BUN 26.1* 11/28/2014 0813   BUN 26* 09/21/2013 0842   CREATININE 0.7 11/28/2014 0813   CREATININE 0.51 09/29/2013 2008      Component Value Date/Time   CALCIUM 9.6 11/28/2014 0813   CALCIUM 9.4 09/21/2013 0842   ALKPHOS 50 11/28/2014 0813   ALKPHOS 52 09/21/2013 0842   AST 33 11/28/2014 0813   AST 29 09/21/2013  0842   ALT 32 11/28/2014 0813   ALT 24 09/21/2013 0842   BILITOT 0.41 11/28/2014 0813   BILITOT 0.3 09/21/2013 0842       Lab Results  Component Value Date   WBC 6.0 11/28/2014   HGB 14.6 11/28/2014   HCT 44.3 11/28/2014   MCV 85.5 11/28/2014   PLT 211 11/28/2014   NEUTROABS 3.5 11/28/2014    ASSESSMENT & PLAN:  Breast cancer of upper-outer quadrant of left female breast ([place]) Left breast invasive ductal carcinoma ER 100% PR 86% HER-2 positive ratio 6.81, Ki-67 46 % status post bilateral mastectomies followed by reconstruction status post 6 cycles of TCH followed by Herceptin maintenance completed 02/16/2014, try different antiestrogen therapies including Aromasin, letrozole, tamoxifen but could not tolerate any of them because of severe depression symptoms.   Surveillance: 1. Breast exam 12/05/2014 normal axilla and supraclavicular areas, bilateral breast implants normal patient got nipple tattoos. Palpable nodularity related to oil cysts in the superior medial aspect of bilateral breasts 2. No role of imaging (MRI breast July 2016 was normal accompanied by CT chest abdomen and pelvis)  Survivorship: Patient exercises twice a day and stays very active. I encouraged her to continue to do her physical activities and exercises.  Return to clinic annually for physical exams and follow-up. I discussed with her that there is no role of blood work for breast cancer surveillance.   No orders of the defined types were placed in this encounter.   The patient has a good understanding of the overall plan. she agrees with it. she will call with any problems that may develop before the next visit here.   Rulon Eisenmenger, MD 12/07/2015

## 2015-12-07 NOTE — Telephone Encounter (Signed)
Appointments made and avs pritned for patient °

## 2015-12-07 NOTE — Assessment & Plan Note (Signed)
Left breast invasive ductal carcinoma ER 100% PR 86% HER-2 positive ratio 6.81, Ki-67 46 % status post bilateral mastectomies followed by reconstruction status post 6 cycles of TCH followed by Herceptin maintenance completed 02/16/2014, try different antiestrogen therapies including Aromasin, letrozole, tamoxifen but could not tolerate any of them because of severe depression symptoms.   Surveillance: 1. Breast exam 12/05/2014 normal axilla and supraclavicular areas, bilateral breast implants normal patient to get nipple tattoos coming up 2. No role of imaging  Survivorship: Patient exercises twice a day and stays very active. I encouraged her to continue to do her physical activities and exercises.  Return to clinic annually for physical exams and follow-up. I discussed with her that there is no role of blood work for breast cancer surveillance.     

## 2015-12-08 ENCOUNTER — Other Ambulatory Visit (HOSPITAL_COMMUNITY): Payer: Self-pay | Admitting: Orthopedic Surgery

## 2015-12-08 DIAGNOSIS — M25522 Pain in left elbow: Secondary | ICD-10-CM

## 2015-12-21 ENCOUNTER — Ambulatory Visit
Admission: RE | Admit: 2015-12-21 | Discharge: 2015-12-21 | Disposition: A | Discharge status: Home / Self Care | Payer: 59 | Source: Ambulatory Visit | Attending: Orthopedic Surgery | Admitting: Orthopedic Surgery

## 2015-12-21 DIAGNOSIS — M25522 Pain in left elbow: Secondary | ICD-10-CM

## 2015-12-29 DIAGNOSIS — E041 Nontoxic single thyroid nodule: Secondary | ICD-10-CM | POA: Diagnosis not present

## 2015-12-29 DIAGNOSIS — Z0000 Encounter for general adult medical examination without abnormal findings: Secondary | ICD-10-CM | POA: Diagnosis not present

## 2016-01-02 DIAGNOSIS — R0602 Shortness of breath: Secondary | ICD-10-CM | POA: Diagnosis not present

## 2016-01-02 DIAGNOSIS — Z1389 Encounter for screening for other disorder: Secondary | ICD-10-CM | POA: Diagnosis not present

## 2016-01-02 DIAGNOSIS — D126 Benign neoplasm of colon, unspecified: Secondary | ICD-10-CM | POA: Diagnosis not present

## 2016-01-02 DIAGNOSIS — Z0000 Encounter for general adult medical examination without abnormal findings: Secondary | ICD-10-CM | POA: Diagnosis not present

## 2016-01-02 DIAGNOSIS — M25522 Pain in left elbow: Secondary | ICD-10-CM | POA: Diagnosis not present

## 2016-01-02 DIAGNOSIS — E041 Nontoxic single thyroid nodule: Secondary | ICD-10-CM | POA: Diagnosis not present

## 2016-01-02 DIAGNOSIS — C50919 Malignant neoplasm of unspecified site of unspecified female breast: Secondary | ICD-10-CM | POA: Diagnosis not present

## 2016-01-02 DIAGNOSIS — G43009 Migraine without aura, not intractable, without status migrainosus: Secondary | ICD-10-CM | POA: Diagnosis not present

## 2016-01-02 DIAGNOSIS — E784 Other hyperlipidemia: Secondary | ICD-10-CM | POA: Diagnosis not present

## 2016-01-03 DIAGNOSIS — M19022 Primary osteoarthritis, left elbow: Secondary | ICD-10-CM | POA: Diagnosis not present

## 2016-01-15 MED FILL — DEXTROAMPHETAMINE 5 MG TAB: 5 | 90 days supply | Qty: 540 | Fill #0

## 2016-02-08 ENCOUNTER — Other Ambulatory Visit: Payer: Self-pay | Admitting: Hematology and Oncology

## 2016-02-09 MED FILL — ESZOPICLONE 3 MG TABLET: 3 | 30 days supply | Qty: 30 | Fill #0

## 2016-02-09 NOTE — Telephone Encounter (Signed)
OK to fill per Dr. Lindi Adie

## 2016-02-20 MED FILL — XOPENEX HFA 45 MCG INHALER: 45 | 25 days supply | Qty: 15 | Fill #1

## 2016-03-22 MED FILL — ESZOPICLONE 3 MG TABLET: 3 | 30 days supply | Qty: 30 | Fill #1

## 2016-03-25 MED FILL — SYNTHROID 100 MCG TABLET: 100 | 90 days supply | Qty: 90 | Fill #0

## 2016-03-25 MED FILL — valACYclovir HCL 1 GM TABS: 1 | 30 days supply | Qty: 30 | Fill #0

## 2016-03-27 MED FILL — XOPENEX HFA 45 MCG INHALER: 45 | 25 days supply | Qty: 15 | Fill #2

## 2016-04-04 MED FILL — CEPHALEXIN 500 MG CAPSULE: 500 | 7 days supply | Qty: 28 | Fill #0

## 2016-04-16 MED FILL — XIFAXAN 200 MG TABLET: 200 | 14 days supply | Qty: 42 | Fill #1

## 2016-04-19 MED FILL — ESZOPICLONE 3 MG TABLET: 3 | 30 days supply | Qty: 30 | Fill #2

## 2016-04-22 DIAGNOSIS — R87612 Low grade squamous intraepithelial lesion on cytologic smear of cervix (LGSIL): Secondary | ICD-10-CM | POA: Diagnosis not present

## 2016-04-29 MED FILL — DEXTROAMPHETAMINE 5 MG TAB: 5 | 90 days supply | Qty: 540 | Fill #0

## 2016-05-06 DIAGNOSIS — H1045 Other chronic allergic conjunctivitis: Secondary | ICD-10-CM | POA: Diagnosis not present

## 2016-05-06 DIAGNOSIS — J309 Allergic rhinitis, unspecified: Secondary | ICD-10-CM | POA: Diagnosis not present

## 2016-05-06 DIAGNOSIS — R05 Cough: Secondary | ICD-10-CM | POA: Diagnosis not present

## 2016-05-06 DIAGNOSIS — Z91013 Allergy to seafood: Secondary | ICD-10-CM | POA: Diagnosis not present

## 2016-05-06 MED FILL — AZELASTINE HCL 0.05% DROPS: 0.05 | 60 days supply | Qty: 6 | Fill #0

## 2016-05-06 MED FILL — MONTELUKAST SOD 10 MG TAB: 10 | 30 days supply | Qty: 30 | Fill #0

## 2016-05-06 MED FILL — FLUTICASONE PROP 50 MCG SPR: 50 | 30 days supply | Qty: 16 | Fill #0

## 2016-05-06 MED FILL — BREO ELLIPTA 200-25 MCG INH: 200-25 | 30 days supply | Qty: 60 | Fill #0

## 2016-05-06 MED FILL — PROAIR RESPICLICK INHAL PWD: 108 (90 BAS | 16 days supply | Qty: 1 | Fill #0

## 2016-05-06 MED FILL — LEVOCETIRIZINE 5 MG TABLET: 5 | 30 days supply | Qty: 30 | Fill #0

## 2016-05-21 ENCOUNTER — Other Ambulatory Visit: Payer: Self-pay | Admitting: Adult Health

## 2016-05-21 DIAGNOSIS — C50412 Malignant neoplasm of upper-outer quadrant of left female breast: Secondary | ICD-10-CM

## 2016-05-21 MED FILL — valACYclovir HCL 1 GM TABS: 1 | 30 days supply | Qty: 30 | Fill #1

## 2016-05-21 MED FILL — ESZOPICLONE 3 MG TABLET: 3 | 30 days supply | Qty: 30 | Fill #3

## 2016-05-22 ENCOUNTER — Telehealth: Payer: Self-pay | Admitting: Hematology and Oncology

## 2016-05-22 NOTE — Telephone Encounter (Signed)
Spoke with patient to confirm 7/7 appt at 830 am

## 2016-06-06 MED FILL — MONTELUKAST SOD 10 MG TAB: 10 | 30 days supply | Qty: 30 | Fill #1

## 2016-06-11 MED FILL — MAGIC MOUTHWASH BOP FORM: 6 days supply | Qty: 120 | Fill #0

## 2016-06-13 DIAGNOSIS — J020 Streptococcal pharyngitis: Secondary | ICD-10-CM | POA: Diagnosis not present

## 2016-06-13 MED FILL — CLARITHROMYCIN 500 MG TAB: 500 | 7 days supply | Qty: 14 | Fill #0

## 2016-06-17 DIAGNOSIS — Z6824 Body mass index (BMI) 24.0-24.9, adult: Secondary | ICD-10-CM | POA: Diagnosis not present

## 2016-06-17 DIAGNOSIS — R0602 Shortness of breath: Secondary | ICD-10-CM | POA: Diagnosis not present

## 2016-06-17 DIAGNOSIS — J209 Acute bronchitis, unspecified: Secondary | ICD-10-CM | POA: Diagnosis not present

## 2016-06-17 MED FILL — levoFLOXacin 500 MG TABS: 500 | 5 days supply | Qty: 5 | Fill #0

## 2016-06-17 MED FILL — HYDROCODONE-HOMATROPINE SYR: 5-1.5 | 12 days supply | Qty: 180 | Fill #0

## 2016-06-20 DIAGNOSIS — Z01419 Encounter for gynecological examination (general) (routine) without abnormal findings: Secondary | ICD-10-CM | POA: Diagnosis not present

## 2016-06-20 DIAGNOSIS — Z6824 Body mass index (BMI) 24.0-24.9, adult: Secondary | ICD-10-CM | POA: Diagnosis not present

## 2016-06-20 DIAGNOSIS — R87612 Low grade squamous intraepithelial lesion on cytologic smear of cervix (LGSIL): Secondary | ICD-10-CM | POA: Diagnosis not present

## 2016-06-28 ENCOUNTER — Encounter: Payer: Self-pay | Admitting: Nurse Practitioner

## 2016-06-28 ENCOUNTER — Telehealth: Payer: Self-pay | Admitting: Nurse Practitioner

## 2016-06-28 ENCOUNTER — Ambulatory Visit (HOSPITAL_BASED_OUTPATIENT_CLINIC_OR_DEPARTMENT_OTHER): Payer: 59 | Admitting: Nurse Practitioner

## 2016-06-28 ENCOUNTER — Ambulatory Visit
Admission: RE | Admit: 2016-06-28 | Discharge: 2016-06-28 | Disposition: A | Discharge status: Home / Self Care | Payer: 59 | Source: Ambulatory Visit | Attending: Nurse Practitioner | Admitting: Nurse Practitioner

## 2016-06-28 VITALS — BP 120/78 | HR 71 | Temp 97.7°F | Resp 17 | Ht 65.0 in | Wt 144.3 lb

## 2016-06-28 DIAGNOSIS — C50412 Malignant neoplasm of upper-outer quadrant of left female breast: Secondary | ICD-10-CM

## 2016-06-28 DIAGNOSIS — N649 Disorder of breast, unspecified: Secondary | ICD-10-CM

## 2016-06-28 DIAGNOSIS — N6001 Solitary cyst of right breast: Secondary | ICD-10-CM | POA: Diagnosis not present

## 2016-06-28 DIAGNOSIS — Z853 Personal history of malignant neoplasm of breast: Secondary | ICD-10-CM

## 2016-06-28 NOTE — Telephone Encounter (Signed)
appt made and avs printed °

## 2016-06-28 NOTE — Progress Notes (Signed)
CLINIC:  Cancer Survivorship   REASON FOR VISIT:  Routine follow-up post-treatment for history of breast cancer.  BRIEF ONCOLOGIC HISTORY:    Breast cancer, left breast ([place]) (Resolved)    Initial Diagnosis Breast cancer, left breast   12/04/2012 Initial Biopsy Left breast needle core biopsy (UOQ): Grade 3, DCIS with necrosis and calcs. ER+ (100%), PR+ (86%).     Breast cancer of upper-outer quadrant of left female breast ([place])   12/04/2012 Initial Biopsy LEFT breast needle core biopsy (UOQ): Grade 3, DCIS with necrosis and calcs. ER+ (100%), PR+ (86%).    12/14/2012 Breast MRI LEFT breast: Clumped nodular enhancement measuring 7.2 x 3.2 x 2.6 cm. Also associated post-biopsy change. RIGHT breast: At 12 o'clock location, there is 23m slightly irregular mass-like area of abnml enhancement. No lymphadenopathy.    12/25/2012 Initial Biopsy RIGHT breast needle core biopsy (upper centeral): Grade 1-2, IDC, DCIS with calcs. ER+ (100%), PR+ (100%), HER2- (ratio 1.23). Ki67 4%.    12/25/2012 Initial Diagnosis Bilateral breast cancer   12/31/2012 Surgery Bilateral mastectomies with bilat SLNB (Donne Hazel. LEFT: Grade 2, IDC, 2.3 cm. (+) LVI. Grade 3 DCIS w/comedonecrosis & calcs. (-) margins. 1 left ax SLN neg.  ER+ (99%), PR+ (10%), HER2 repeated and (+) ratio (6.81). Ki67 46%. RIGHT: Benign, 1 SLN neg.   12/31/2012 Pathologic Stage LEFT breast: pT2, pN0, pMx. Stage IIA.    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 EF 55-60%   02/04/2013 - 05/13/2013 Adjuvant Chemotherapy Taxotere/Carbo/Herceptin q 3 wks x 5 cycles completed. (planned for 6 cycles but d/c'd early due to pt wishes and recent infection).    05/2013 -  Anti-estrogen oral therapy Attempted to take Letrozole, Aromasin, & Tamoxifen but patient could not tolerate any anti-estrogen therapy due to severe depressive symptoms and joint/muscle aches & pains.  No  longer taking anti-estrogen therapy.   09/29/2013 Surgery LEFT breast reconstruction with lat flap and placement of tissue expander ([facility]).    11/26/2013 Imaging CT chest: No definite CT findings for chest wall recurrence and no suspicious supraclavicular or axillary adenopathy.  Negative for metastatic disease in lungs or bones.     - 02/16/2014 Adjuvant Chemotherapy Herceptin maintenance x 1 year completed.    03/09/2014 Surgery Removal of bilateral tissue expanders and placement of bilateral breast implants ([facility]).    03/09/2014 Procedure Right scar biopsy: No malignancy & Left breast capsule biopsy: No evidence of malignancy.    08/22/2014 Echocardiogram Post-treatment. EF 55-60%   06/30/2015 Survivorship Survivorship Care Plan given to patient and reviewed with her during in-person visit.     INTERVAL HISTORY:  Ms. JMallozzipresents to the Survivorship Clinic today for ongoing follow up regarding her history of breast cancer. Overall, Ms. JSharkreports feeling doing well since her last visit with Dr. GLindi Adiein 11/2015.  She remarried over the Easter holiday and is doing quite well.  She has noted a small area along her right breast that she is concerned about.  She recently saw her GYN and showed it to him, as well.  It is nontender.  Otherwise, she denies any change in either reconstructed breast.  She recently had her nipple tattooing redone with good results.  She has a good appetite and energy level.  She has some pain in her elbow from a previous injury, but this is stable.  She has an occasional cough related to her asthma, but this is unchanged.  She denies any headache or  shortness of breath.  REVIEW OF SYSTEMS:  General: Occasional night sweats and loss of sleep. Hypothyroidism.  Denies fever, chills, unintentional weight loss, or generalized fatigue.  HEENT: Wears contacts.  Denies visual changes, hearing loss, mouth sores, or difficulty swallowing. Cardiac: Denies palpitations and lower  extremity edema.  Respiratory: As above. Breast: As above. GI: Denies abdominal pain, constipation, diarrhea, nausea, or vomiting.  GU: Denies dysuria, hematuria, vaginal bleeding, vaginal discharge, or vaginal dryness.  Musculoskeletal: As above. Neuro: Denies recent fall or numbness / tingling in her extremities.  Skin: Denies rash, pruritis, or open wounds.  Psych: Denies depression, anxiety, insomnia, or memory loss.   A 14-point review of systems was completed and was negative, except as noted above.   ONCOLOGY TREATMENT TEAM:  1. Surgeon:  Dr. Donne Hazel at [facility]  2. Medical Oncologist: Dr. Lindi Adie 3. Plastic Surgeon: Dr. Marla Roe at [facility]    PAST MEDICAL/SURGICAL HISTORY:  Past Medical History  Diagnosis Date  . IBS (irritable bowel syndrome)   . ADD (attention deficit disorder)   . Hypothyroidism   . History of breast cancer   . History of cervical cancer   . History of palpitations     last echo 08/22/2014  . Abnormally small mouth   . Dental crowns present   . Difficulty swallowing pills   . Asthma     prn inhaler  . Blood transfusion without reported diagnosis   . Allergy    Past Surgical History  Procedure Laterality Date  . Brain surgery      removal benign frontal lobe cyst in 1987  . Foot surgery Bilateral 07/2012  . Gynecologic cryosurgery  1998  . Breast enhancement surgery Bilateral ~ 2008  . Facial cosmetic surgery  ~ 2010  . Axillary sentinel node biopsy  12/31/2012    Procedure: AXILLARY SENTINEL NODE BIOPSY;  Surgeon: Rolm Bookbinder, MD;  Location: [place];  Service: General;  Laterality: Bilateral;  bilateral sentinel node  . Total mastectomy  12/31/2012    Procedure: TOTAL MASTECTOMY;  Surgeon: Rolm Bookbinder, MD;  Location: [place];  Service: General;  Laterality: Bilateral;  bilateral total mastectomies  . Breast reconstruction with placement of tissue expander and flex hd (acellular  hydrated dermis)  12/31/2012    Procedure: BREAST RECONSTRUCTION WITH PLACEMENT OF TISSUE EXPANDER AND FLEX HD (ACELLULAR HYDRATED DERMIS);  Surgeon: Theodoro Kos, DO;  Location: [place];  Service: Plastics;  Laterality: Bilateral;  bilateral immediate breast reconstruction with expanders and flex hd   . Portacath placement  01/27/2013    Procedure: INSERTION PORT-A-CATH;  Surgeon: Rolm Bookbinder, MD;  Location: [place];  Service: General;  Laterality: Right;  . Incision and drainage of wound Left 04/28/2013    Procedure: IRRIGATION AND DEBRIDEMENT LEFT BREAST WOUND, POSSIBLE CLOSURE OF WOUND WITH DRAIN PLACEMENT;  Surgeon: Theodoro Kos, DO;  Location: [place];  Service: Plastics;  Laterality: Left;  . Tee without cardioversion N/A 06/04/2013    Procedure: TRANSESOPHAGEAL ECHOCARDIOGRAM (TEE);  Surgeon: Jolaine Artist, MD;  Location: [facility] ENDOSCOPY;  Service: Cardiovascular;  Laterality: N/A;  . Tissue expander placement Left 06/16/2013    Procedure: LEFT BREAST TISSUE EXPANDER REMOVAL;  Surgeon: Theodoro Kos, DO;  Location: [place];  Service: Plastics;  Laterality: Left;  . Dilation and curettage of uterus  2005  . Cervical cerclage    . Latissimus flap to breast Left 09/29/2013    Procedure: LATISSIMUS FLAP  TO BREAST WITH TISSUE EXPANDER PLACEMENT FOR LEFT BREAST RECONSTRUCTION;  Surgeon: Theodoro Kos, DO;  Location: [place];  Service: Plastics;  Laterality: Left;  . Tissue expander placement Left 09/29/2013    Procedure: TISSUE EXPANDER;  Surgeon: Theodoro Kos, DO;  Location: [place];  Service: Plastics;  Laterality: Left;  . Removal of bilateral tissue expanders with placement of bilateral breast implants Bilateral 03/09/2014    Procedure: REMOVAL OF BILATERAL TISSUE EXPANDERS WITH PLACEMENT OF BILATERAL BREAST IMPLANTS;  Surgeon: Theodoro Kos, DO;  Location: [place];  Service: Plastics;  Laterality:  Bilateral;  . Liposuction Bilateral 03/09/2014    Procedure: LIPOSUCTION ABDOMEN W/ LIPOFILLING LATERAL BILATERAL BREAST;  Surgeon: Theodoro Kos, DO;  Location: [place];  Service: Plastics;  Laterality: Bilateral;  LIPOSUCTION NECK  . Port-a-cath removal Right 03/09/2014    Procedure: REMOVAL PORT-A-CATH;  Surgeon: Theodoro Kos, DO;  Location: [place];  Service: Plastics;  Laterality: Right;  . Total abdominal hysterectomy w/ bilateral salpingoophorectomy  03/28/2004  . Lysis of adhesion  03/28/2004    pelvic  . Abdominoplasty  03/28/2004  . Lipectomy Bilateral 03/28/2004    hip  . Endometrial fulguration  05/12/2003  . Radiofrequency ablation nerves  05/12/2003    uterosacral nerve ablation  . Diagnostic laparoscopy  05/12/2003    right partial salpingectomy; lysis paraovarian adhesions left  . Capsulotomy Left 09/01/2014    Procedure: CAPSULOTOMY OF LEFT BREAST WITH LIPOFILLING FOR SYMMETRY;  Surgeon: Theodoro Kos, DO;  Location: [place];  Service: Plastics;  Laterality: Left;  . Liposuction with lipofilling Bilateral 09/01/2014    Procedure: LIPOSUCTION WITH LIPOFILLING;  Surgeon: Theodoro Kos, DO;  Location: [place];  Service: Plastics;  Laterality: Bilateral;  . Abdominal hysterectomy       ALLERGIES:  Allergies  Allergen Reactions  . Betadine [Povidone Iodine] Anaphylaxis  . Iodine Anaphylaxis  . Shellfish-Derived Products Anaphylaxis  . Cleocin [Clindamycin Hcl] Rash  . Ginger Rash  . Penicillins Rash  . Sulfamethoxazole-Trimethoprim Rash     CURRENT MEDICATIONS:  Current Outpatient Prescriptions on File Prior to Visit  Medication Sig Dispense Refill  . aspirin 81 MG tablet Take 81 mg by mouth daily.    . B Complex-C (B-COMPLEX WITH VITAMIN C) tablet Take 1 tablet by mouth daily.    . Biotin 1 MG CAPS Take 1 capsule by mouth daily.    [name] dextroamphetamine (DEXTROSTAT) 5 MG tablet Take 5 mg by mouth 4 (four)  times daily as needed.     [name] EPINEPHrine 0.3 mg/0.3 mL IJ SOAJ injection Inject 0.3 mLs (0.3 mg total) into the muscle once. (Patient taking differently: Inject 0.3 mg into the muscle once. As needed) 1 Device 5  . Eszopiclone 3 MG TABS TAKE 1 TABLET BY MOUTH IMMEDIATELY BEFORE BEDTIME (Patient taking differently: TAKE 1/2 TABLET BY MOUTH IMMEDIATELY BEFORE BEDTIME) 30 tablet 3  . hyoscyamine (ANASPAZ) 0.125 MG TBDP disintergrating tablet Place 0.125 mg under the tongue as needed.     . levalbuterol (XOPENEX HFA) 45 MCG/ACT inhaler Inhale 2 puffs into the lungs every 6 (six) hours as needed for wheezing. 1 Inhaler 12  . Multiple Vitamin (MULTIVITAMIN WITH MINERALS) TABS Take 1 tablet by mouth daily.    . rifaximin (XIFAXAN) 200 MG tablet As needed    . SYNTHROID 100 MCG tablet TAKE 1 TABLET BY MOUTH DAILY. 30 tablet 4  . valACYclovir (VALTREX) 500 MG tablet Take 250 mg by mouth daily.  No current facility-administered medications on file prior to visit.     ONCOLOGIC FAMILY HISTORY:  Family History  Problem Relation Age of Onset  . Cancer Mother 24    uterine, mult myeloma  . CVA Father   . Cancer Sister 28    breast  . Cancer Maternal Grandmother 66    breast  . Lupus Sister     twin sister  . Sarcoidosis Sister     twin sister  . Rheum arthritis Sister     twin sister  . Celiac disease Daughter      GENETIC COUNSELING/TESTING: Yes, performed 01/20/13: CHEK2 genetic mutation: c.1100del (p.Thur367Metfs*15). Other genes tested included: APC, ATM, BARD1, BMPR1A, BRCA1, BRCA2, BRIP1, CDH1, CDK4, CDKN2A, CHEK2, EPCAM, MLH1, MSH2, MSH6, MUTYH, NBN, PALB2, PMS2, PTEN, RAD51C, RAD51D, SMAD4, STK11, and TP53.   SOCIAL HISTORY:  Jane Gutierrez is married (as above) and lives with her family in [place], [place].  She has 1 biological child. 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  She denies any current or history of tobacco, alcohol,  or illicit drug use.     PHYSICAL EXAMINATION:  Vital Signs: Filed Vitals:   06/28/16 0844  BP: 120/78  Pulse: 71  Temp: 97.7 F (36.5 C)  Resp: 17   Weight: 144 (increase of 2# since 11/2015) ECOG performance status: 0 General: Well-nourished, well-appearing female in no acute distress.  She is unaccompanied in clinic today.   HEENT: Head is atraumatic and normocephalic.  Pupils equal and reactive to light and accomodation. Conjunctivae clear without exudate.  Sclerae anicteric. Oral mucosa is pink, moist, and intact without lesions.  Oropharynx is pink without lesions or erythema.  Lymph: No cervical, supraclavicular, infraclavicular, or axillary lymphadenopathy noted on palpation.  Cardiovascular: Regular rate and rhythm without murmurs, rubs, or gallops. Respiratory: Clear to auscultation bilaterally. Chest expansion symmetric without accessory muscle use on inspiration or expiration.  Breast: Bilateral breast exam performed.  Small ~0.5cm firm area consistent with cyst at area of concern in right breast at 2-3:00.  Mobile, non tender.  Otherwise, no mass or lesion along either reconstructed breast.  Good cosmesis. GI: Abdomen soft and round. No tenderness to palpation. Bowel sounds normoactive in 4 quadrants. No hepatosplenomegaly.   GU: Deferred.  Musculoskeletal: Muscle strength 5/5 in all extremities.   Neuro: No focal deficits. Steady gait.  Psych: Mood and affect normal and appropriate for situation.  Extremities: No edema, cyanosis, or clubbing.  Skin: Warm and dry. No open lesions noted.   LABORATORY DATA:  No results found for this or any previous visit (from the past 2160 hour(s)).    ASSESSMENT AND PLAN:   1. Breast cancer: Stage IIA invasive ductal carcinoma of the left breast (11/2012), ER positive, PR positive, HER2/neu positive, S/P bilateral mastectomies with reconstruction (12/2012) followed by adjuvant chemotherapy with docetaxel, carboplatin, and trastuzumab x  5 (completed 04/2013) with maintenance trastuzumab through 01/2014, unable to tolerate adjuvant endocrine therapy due to side effects, now followed in a program of surveillance.  Ms. Fawley is doing well with no clinical symptoms worrisome for cancer recurrence at this time. The area that she has palpated along her right breast feels like a cyst and we will check an ultrasound to confirm as this has not been evaluated.  MRI in 06/2015 showed no evidence of mass or lesion. Barring any new findings, she will continue in a program of surveillance. I have reviewed the recommendations for  ongoing surveillance with her and she will follow-up with her medical oncologist,  Dr. Lindi Adie, in December 2017 with history and physical exam per surveillance protocol.  She was instructed to make [place] aware if she notes any change within her reconstructed breast, any new symptoms such as pain, shortness of breath, weight loss, or fatigue.    2. Cancer screening:  Due to Ms. Roadcap history and her age, she should receive screening for skin cancers and colon cancer.  She is S/P hysterectomy.  The information and recommendations were shared with the patient and in her written after visit summary.  3. Health maintenance / wellness: Ms. Fossett is at a healthy weight and is very active physically.  A copy of our overall recommendations regarding diet, exercise and health promotion (avoid alcohol and tobacco, promote psychosocial health) was shared with her via her after visit summary.  She was encouraged to call with problems or questions.   4. Support services/counseling:  Ms. Critzer was offered support today through active listening and expressive supportive counseling.  She has a good grasp on her diagnosis and prognosis and is   A total of 25 minutes of face-to-face time was spent with this patient with greater than 50% of that time in counseling and care-coordination.   Sylvan Cheese, NP  Survivorship Program [facility] [phone]   Note: PRIMARY CARE PROVIDER Sheela Stack, [place] [phone]

## 2016-06-28 NOTE — Patient Instructions (Signed)
Thank you for coming in today!  We will check the ultrasound and confirm that the area is a cyst.  Please continue to perform your self breast exam and report any changes. If you note any new symptoms (please see below), be sure to notify us ASAP.   We'll have you return in 6 months time with Dr. Lindi Adie for your next appointment or sooner if you have any problems. Please be sure to stop by scheduling on your way out to make those appointment(s).  Looking forward to working with you in the future!  Let us know if you have any questions!  Symptoms to Watch for and Report to Your Provider  . Change along your reconstruction . New or unusual pain that seems unrelated to an injury and does not go away, including back pain or bone pain . Weight loss without trying/intending . Unexplained bleeding . A rash or allergic reaction, such as swelling, severe itching or wheezing . Chills or fevers . Persistent headaches . Shortness of breath or difficulty breathing . Bloody stools or blood in your urine . Nausea, vomiting, diarrhea, loss of appetite, or trouble swallowing . A cough that does not go away . Abdominal pain . Swelling in your arms or legs . Fractures . Hot flashes or other menopausal symptoms . Any other signs mentioned by your doctor or nurse or any unusual symptoms                 that you just can't explain   NOTE: Just because you have certain symptoms, it doesn't mean the cancer has come back or you have a new cancer. Symptoms can be due to other problems that need to be addressed.  It is important to watch for these symptoms and report them to your provider so you can be medically evaluated for any of these concerns!    Living a Life of Wellness After Cancer:  *Note: Please consult your health care provider before using any medications, supplements, over-the-counter products, or other interventions.  Also, please consult your primary care provider before you begin any lifestyle program  (diet, exercise, etc.).  Your safety is our top priority and we want to make sure you continue to live a long and healthy life!    Healthy Lifestyle Recommendations  As a cancer survivor, it is important develop a lifelong commitment to a healthy lifestyle. A healthy lifestyle can prevent cancer from returning as well as prevent other diseases like heart disease, diabetes and high blood pressure.  These are some things that you can do to have a healthy lifestyle:  [name] Maintain a healthy weight.  . Exercise daily per your doctor's orders. . Eat a balanced diet high in fruits, vegetables, bran, and fiber. Limit intake of red meat      and processed foods.  . Limit how much alcohol you consume, if at all. Ali Lowe regular bone mineral density testing for osteoporosis.  . Talk to your doctor about cardiovascular disease or "heart disease" screening. . Stop smoking (if you smoke). . Know your family history. . Be mindful of your emotional, social, and spiritual needs. . Meet regularly with a Primary Care Provider (PCP). Find a PCP if you do not             already have one. . Talk to your doctor about regular cancer screening including screening for colon           cancer, GYN cancers, and skin cancer.

## 2016-07-08 DIAGNOSIS — Z9013 Acquired absence of bilateral breasts and nipples: Secondary | ICD-10-CM | POA: Diagnosis not present

## 2016-07-08 DIAGNOSIS — C50912 Malignant neoplasm of unspecified site of left female breast: Secondary | ICD-10-CM | POA: Diagnosis not present

## 2016-07-08 DIAGNOSIS — Z9889 Other specified postprocedural states: Secondary | ICD-10-CM | POA: Diagnosis not present

## 2016-07-22 ENCOUNTER — Other Ambulatory Visit: Payer: Self-pay | Admitting: Hematology and Oncology

## 2016-07-22 MED FILL — valACYclovir HCL 1 GM TABS: 1 | 90 days supply | Qty: 45 | Fill #0

## 2016-07-22 MED FILL — SYNTHROID 100 MCG TABLET: 100 | 90 days supply | Qty: 90 | Fill #1

## 2016-07-24 ENCOUNTER — Other Ambulatory Visit: Payer: Self-pay | Admitting: Hematology and Oncology

## 2016-07-24 MED FILL — ESZOPICLONE 3 MG TABLET: 3 | 30 days supply | Qty: 30 | Fill #0

## 2016-09-02 DIAGNOSIS — D1801 Hemangioma of skin and subcutaneous tissue: Secondary | ICD-10-CM | POA: Diagnosis not present

## 2016-09-02 DIAGNOSIS — Z85828 Personal history of other malignant neoplasm of skin: Secondary | ICD-10-CM | POA: Diagnosis not present

## 2016-09-02 DIAGNOSIS — L821 Other seborrheic keratosis: Secondary | ICD-10-CM | POA: Diagnosis not present

## 2016-09-02 DIAGNOSIS — L814 Other melanin hyperpigmentation: Secondary | ICD-10-CM | POA: Diagnosis not present

## 2016-09-02 DIAGNOSIS — D235 Other benign neoplasm of skin of trunk: Secondary | ICD-10-CM | POA: Diagnosis not present

## 2016-09-06 ENCOUNTER — Other Ambulatory Visit: Payer: Self-pay | Admitting: Hematology and Oncology

## 2016-09-06 NOTE — Telephone Encounter (Signed)
Chart reviewed.

## 2016-09-06 NOTE — Addendum Note (Signed)
Addended by: Prentiss Bells on: 09/06/2016 05:46 PM   Modules accepted: Orders

## 2016-09-09 MED FILL — DEXTROAMPHETAMINE 5 MG TAB: 5 | 90 days supply | Qty: 540 | Fill #0

## 2016-09-12 MED FILL — ESZOPICLONE 3 MG TABLET: 3 | 30 days supply | Qty: 30 | Fill #0

## 2016-10-08 MED FILL — OSCIMIN 0.125 MG ODT: 0.125 | 15 days supply | Qty: 120 | Fill #0

## 2016-10-25 MED FILL — predniSONE 20 MG TABS: 20 | 3 days supply | Qty: 6 | Fill #0

## 2016-10-25 MED FILL — SULFAMETHOXAZOLE-TMP DS TAB: 800-160 | 10 days supply | Qty: 20 | Fill #0

## 2016-10-28 ENCOUNTER — Other Ambulatory Visit: Payer: Self-pay | Admitting: Hematology and Oncology

## 2016-10-28 MED FILL — valACYclovir HCL 1 GM TABS: 1 | 30 days supply | Qty: 30 | Fill #0

## 2016-10-28 MED FILL — SYNTHROID 100 MCG TABLET: 100 | 90 days supply | Qty: 90 | Fill #2

## 2016-11-08 ENCOUNTER — Other Ambulatory Visit: Payer: Self-pay

## 2016-11-08 ENCOUNTER — Telehealth: Payer: Self-pay | Admitting: *Deleted

## 2016-11-08 MED ORDER — ESZOPICLONE 3 MG PO TABS: ORAL_TABLET | ORAL | 0 refills | Status: DC

## 2016-11-08 MED FILL — ESZOPICLONE 3 MG TABLET: 3 | 30 days supply | Qty: 30 | Fill #0

## 2016-11-08 NOTE — Telephone Encounter (Signed)
Reviewed refill of Eszopicline with Dr. Lindi Adie who approved medication.  Called refill in to [facility] as requested by pt.

## 2016-11-08 NOTE — Telephone Encounter (Signed)
"  I ned a refill on Lunesta and the pharmacy has not received order."  Collaborative nurse notified.

## 2016-11-11 DIAGNOSIS — H1045 Other chronic allergic conjunctivitis: Secondary | ICD-10-CM | POA: Diagnosis not present

## 2016-11-11 DIAGNOSIS — Z91013 Allergy to seafood: Secondary | ICD-10-CM | POA: Diagnosis not present

## 2016-11-11 DIAGNOSIS — R05 Cough: Secondary | ICD-10-CM | POA: Diagnosis not present

## 2016-11-11 DIAGNOSIS — Z9102 Food additives allergy status: Secondary | ICD-10-CM | POA: Diagnosis not present

## 2016-11-26 MED FILL — CEPHALEXIN 500 MG CAPSULE: 500 | 7 days supply | Qty: 21 | Fill #0

## 2016-12-05 ENCOUNTER — Ambulatory Visit: Payer: 59 | Admitting: Hematology and Oncology

## 2016-12-06 MED FILL — OSCIMIN 0.125 MG ODT: 0.125 | 15 days supply | Qty: 120 | Fill #0

## 2016-12-08 NOTE — Assessment & Plan Note (Signed)
Left breast invasive ductal carcinoma ER 100% PR 86% HER-2 positive ratio 6.81, Ki-67 46 % status post bilateral mastectomies followed by reconstruction status post 6 cycles of TCH followed by Herceptin maintenance completed 02/16/2014, try different antiestrogen therapies including Aromasin, letrozole, tamoxifen but could not tolerate any of them because of severe depression symptoms.   Surveillance: 1. Breast exam 12/09/16 normal axilla and supraclavicular areas, bilateral breast implants normal patient got nipple tattoos. Palpable nodularity related to oil cysts in the superior medial aspect of bilateral breasts 2. No role of imaging (MRI breast July 2016 was normal accompanied by CT chest abdomen and pelvis)  Survivorship: Patient exercises twice a day and stays very active. I encouraged her to continue to do her physical activities and exercises.  Return to clinic annually for physical exams and follow-up

## 2016-12-09 ENCOUNTER — Encounter: Payer: Self-pay | Admitting: Hematology and Oncology

## 2016-12-09 ENCOUNTER — Ambulatory Visit (HOSPITAL_BASED_OUTPATIENT_CLINIC_OR_DEPARTMENT_OTHER): Payer: 59 | Admitting: Hematology and Oncology

## 2016-12-09 DIAGNOSIS — C50412 Malignant neoplasm of upper-outer quadrant of left female breast: Secondary | ICD-10-CM

## 2016-12-09 DIAGNOSIS — G4700 Insomnia, unspecified: Secondary | ICD-10-CM | POA: Diagnosis not present

## 2016-12-09 DIAGNOSIS — N6002 Solitary cyst of left breast: Secondary | ICD-10-CM

## 2016-12-09 DIAGNOSIS — N6001 Solitary cyst of right breast: Secondary | ICD-10-CM

## 2016-12-09 DIAGNOSIS — Z853 Personal history of malignant neoplasm of breast: Secondary | ICD-10-CM | POA: Diagnosis not present

## 2016-12-09 DIAGNOSIS — Z170 Estrogen receptor positive status [ER+]: Principal | ICD-10-CM

## 2016-12-09 MED ORDER — ESZOPICLONE 3 MG PO TABS: ORAL_TABLET | ORAL | 5 refills | Status: DC

## 2016-12-09 NOTE — Progress Notes (Signed)
[name], MD as PCP - General (Endocrinology)  DIAGNOSIS:  Encounter Diagnosis  Name Primary?  . Malignant neoplasm of upper-outer quadrant of left breast in female, estrogen receptor positive ([place])     SUMMARY OF ONCOLOGIC HISTORY:   Breast cancer, left breast ([place]) (Resolved)    Initial Diagnosis    Breast cancer, left breast      12/04/2012 Initial Biopsy    Left breast needle core biopsy (UOQ): Grade 3, DCIS with necrosis and calcs. ER+ (100%), PR+ (86%).        Breast cancer of upper-outer quadrant of left female breast ([place])   12/04/2012 Initial Biopsy    LEFT breast needle core biopsy (UOQ): Grade 3, DCIS with necrosis and calcs. ER+ (100%), PR+ (86%).       12/14/2012 Breast MRI    LEFT breast: Clumped nodular enhancement measuring 7.2 x 3.2 x 2.6 cm. Also associated post-biopsy change. RIGHT breast: At 12 o'clock location, there is 36m slightly irregular mass-like area of abnml enhancement. No lymphadenopathy.       12/25/2012 Initial Biopsy    RIGHT breast needle core biopsy (upper centeral): Grade 1-2, IDC, DCIS with calcs. ER+ (100%), PR+ (100%), HER2- (ratio 1.23). Ki67 4%.       12/25/2012 Initial Diagnosis    Bilateral breast cancer      12/31/2012 Surgery    Bilateral mastectomies with bilat SLNB ([facility]. LEFT: Grade 2, IDC, 2.3 cm. (+) LVI. Grade 3 DCIS w/comedonecrosis & calcs. (-) margins. 1 left ax SLN neg.  ER+ (99%), PR+ (10%), HER2 repeated and (+) ratio (6.81). Ki67 46%. RIGHT: Benign, 1 SLN neg.      12/31/2012 Pathologic Stage    LEFT breast: pT2, pN0, pMx. Stage IIA.       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 EF 55-60%      02/04/2013 - 05/13/2013 Adjuvant Chemotherapy    Taxotere/Carbo/Herceptin q 3 wks x 5 cycles completed. (planned for 6 cycles but d/c'd early due to pt wishes and recent infection).       05/2013 -  Anti-estrogen oral therapy    Attempted to take Letrozole, Aromasin, & Tamoxifen but patient could not tolerate any anti-estrogen therapy due to severe depressive symptoms and joint/muscle aches & pains.  No longer taking anti-estrogen therapy.      09/29/2013 Surgery    LEFT breast reconstruction with lat flap and placement of tissue expander ([facility]).       11/26/2013 Imaging    CT chest: No definite CT findings for chest wall recurrence and no suspicious supraclavicular or axillary adenopathy.  Negative for metastatic disease in lungs or bones.        - 02/16/2014 Adjuvant Chemotherapy    Herceptin maintenance x 1 year completed.       03/09/2014 Surgery    Removal of bilateral tissue expanders and placement of bilateral breast implants ([facility]).       03/09/2014 Procedure    Right scar biopsy: No malignancy & Left breast capsule biopsy: No evidence of malignancy.       08/22/2014 Echocardiogram    Post-treatment. EF 55-60%      06/30/2015 Survivorship    Survivorship Care Plan given to patient and reviewed with her during in-person visit.        CHIEF COMPLIANT: Surveillance breast cancer  INTERVAL HISTORY: Jane ADORNOis a 60year old with  above-mentioned history of bilateral breast implants after having had bilateral mastectomies. She is here for annual follow-up. She had a biopsy of right breast area which felt nodular. It came back benign. She had breast MRI in July 2016 and ultrasounds March 2016 and CT chest abdomen pelvis July 2016 which were all normal. She stays very active because of her boyfriend who is an avid Location manager. She continues to stay active and busy. She had an oil cyst detected by ultrasound in July 2017  REVIEW OF SYSTEMS:   Constitutional: Denies fevers, chills or abnormal weight loss Eyes: Denies blurriness of vision Ears, nose, mouth, throat, and face: Denies mucositis or sore throat Respiratory: Denies cough, dyspnea  or wheezes Cardiovascular: Denies palpitation, chest discomfort Gastrointestinal:  Denies nausea, heartburn or change in bowel habits Skin: Denies abnormal skin rashes Lymphatics: Denies new lymphadenopathy or easy bruising Neurological:Denies numbness, tingling or new weaknesses Behavioral/Psych: Mood is stable, no new changes  Extremities: No lower extremity edema Breast:  denies any pain or lumps or nodules in either reconstructed breasts All other systems were reviewed with the patient and are negative.  I have reviewed the past medical history, past surgical history, social history and family history with the patient and they are unchanged from previous note.  ALLERGIES:  is allergic to betadine [povidone iodine]; iodine; shellfish-derived products; cleocin [clindamycin hcl]; ginger; penicillins; and sulfamethoxazole-trimethoprim.  MEDICATIONS:  Current Outpatient Prescriptions  Medication Sig Dispense Refill  . albuterol (PROVENTIL) (2.5 MG/3ML) 0.083% nebulizer solution Take 2.5 mg by nebulization every 6 (six) hours as needed for wheezing or shortness of breath.    [name] aspirin 81 MG tablet Take 81 mg by mouth daily.    . B Complex-C (B-COMPLEX WITH VITAMIN C) tablet Take 1 tablet by mouth daily.    . Biotin 1 MG CAPS Take 1 capsule by mouth daily.    [name] dextroamphetamine (DEXTROSTAT) 5 MG tablet Take 5 mg by mouth 4 (four) times daily as needed.     [name] EPINEPHrine 0.3 mg/0.3 mL IJ SOAJ injection Inject 0.3 mLs (0.3 mg total) into the muscle once. (Patient taking differently: Inject 0.3 mg into the muscle once. As needed) 1 Device 5  . Eszopiclone 3 MG TABS TAKE 1 TABLET BY MOUTH IMMEDIATELY BEFORE BEDTIME 30 tablet 0  . hyoscyamine (ANASPAZ) 0.125 MG TBDP disintergrating tablet Place 0.125 mg under the tongue as needed.     . levalbuterol (XOPENEX HFA) 45 MCG/ACT inhaler Inhale 2 puffs into the lungs every 6 (six) hours as needed for wheezing. 1 Inhaler 12  . Multiple Vitamin  (MULTIVITAMIN WITH MINERALS) TABS Take 1 tablet by mouth daily.    . rifaximin (XIFAXAN) 200 MG tablet As needed    . SYNTHROID 100 MCG tablet TAKE 1 TABLET BY MOUTH DAILY. 30 tablet 4  . valACYclovir (VALTREX) 500 MG tablet Take 250 mg by mouth daily.    . vitamin E 600 UNIT capsule Take by mouth.     No current facility-administered medications for this visit.     PHYSICAL EXAMINATION: ECOG PERFORMANCE STATUS: 0 - Asymptomatic  Vitals:   12/09/16 0818  BP: 131/80  Pulse: 82  Resp: 18  Temp: 97.7 F (36.5 C)   Filed Weights   12/09/16 0818  Weight: 151 lb 6.4 oz (68.7 kg)    GENERAL:alert, no distress and comfortable SKIN: skin color, texture, turgor are normal, no rashes or significant lesions EYES: normal, Conjunctiva are pink and non-injected, sclera clear OROPHARYNX:no  exudate, no erythema and lips, buccal mucosa, and tongue normal  NECK: supple, thyroid normal size, non-tender, without nodularity LYMPH:  no palpable lymphadenopathy in the cervical, axillary or inguinal LUNGS: clear to auscultation and percussion with normal breathing effort HEART: regular rate & rhythm and no murmurs and no lower extremity edema ABDOMEN:abdomen soft, non-tender and normal bowel sounds MUSCULOSKELETAL:no cyanosis of digits and no clubbing  NEURO: alert & oriented x 3 with fluent speech, no focal motor/sensory deficits EXTREMITIES: No lower extremity edema BREAST: No palpable masses or nodules in either right or left breasts. No palpable axillary supraclavicular or infraclavicular adenopathy no breast tenderness or nipple discharge. (exam performed in the presence of a chaperone)  LABORATORY DATA:  I have reviewed the data as listed   Chemistry      Component Value Date/Time   NA 140 11/28/2014 0813   K 4.9 11/28/2014 0813   CL 101 09/21/2013 0842   CL 104 06/10/2013 1312   CO2 27 11/28/2014 0813   BUN 26.1 (H) 11/28/2014 0813   CREATININE 0.7 11/28/2014 0813      Component  Value Date/Time   CALCIUM 9.6 11/28/2014 0813   ALKPHOS 50 11/28/2014 0813   AST 33 11/28/2014 0813   ALT 32 11/28/2014 0813   BILITOT 0.41 11/28/2014 0813       Lab Results  Component Value Date   WBC 6.0 11/28/2014   HGB 14.6 11/28/2014   HCT 44.3 11/28/2014   MCV 85.5 11/28/2014   PLT 211 11/28/2014   NEUTROABS 3.5 11/28/2014    ASSESSMENT & PLAN:  Breast cancer of upper-outer quadrant of left female breast ([place]) Left breast invasive ductal carcinoma ER 100% PR 86% HER-2 positive ratio 6.81, Ki-67 46 % status post bilateral mastectomies followed by reconstruction status post 6 cycles of TCH followed by Herceptin maintenance completed 02/16/2014, try different antiestrogen therapies including Aromasin, letrozole, tamoxifen but could not tolerate any of them because of severe depression symptoms.   Surveillance: 1. Breast exam 12/09/16 normal axilla and supraclavicular areas, bilateral breast implants normal patient got nipple tattoos. Palpable nodularity related to oil cysts in the superior medial aspect of bilateral breasts 2. No role of imaging (MRI breast July 2016 was normal accompanied by CT chest abdomen and pelvis) Breast ultrasound July 2017 revealed oil cysts  Insomnia: Sent a prescription for Lunesta I gave her a prescription for weak because of alopecia from prior Taxotere.  Survivorship: Patient exercises twice a day and stays very active. I encouraged her to continue to do her physical activities and exercises. She will see survivorship clinic in 6 months. I will see her annually for physical exams and follow-up   No orders of the defined types were placed in this encounter.  The patient has a good understanding of the overall plan. she agrees with it. she will call with any problems that may develop before the next visit here.   Rulon Eisenmenger, MD 12/09/16

## 2016-12-20 ENCOUNTER — Other Ambulatory Visit: Payer: Self-pay | Admitting: Nurse Practitioner

## 2016-12-30 DIAGNOSIS — E041 Nontoxic single thyroid nodule: Secondary | ICD-10-CM | POA: Diagnosis not present

## 2016-12-30 DIAGNOSIS — Z0000 Encounter for general adult medical examination without abnormal findings: Secondary | ICD-10-CM | POA: Diagnosis not present

## 2016-12-30 MED FILL — valACYclovir HCL 1 GM TABS: 1 | 30 days supply | Qty: 30 | Fill #1

## 2016-12-31 MED FILL — ESZOPICLONE 3 MG TABLET: 3 | 30 days supply | Qty: 30 | Fill #0

## 2017-01-02 DIAGNOSIS — Z1212 Encounter for screening for malignant neoplasm of rectum: Secondary | ICD-10-CM | POA: Diagnosis not present

## 2017-01-06 DIAGNOSIS — Z0000 Encounter for general adult medical examination without abnormal findings: Secondary | ICD-10-CM | POA: Diagnosis not present

## 2017-01-06 DIAGNOSIS — E041 Nontoxic single thyroid nodule: Secondary | ICD-10-CM | POA: Diagnosis not present

## 2017-01-06 DIAGNOSIS — F5104 Psychophysiologic insomnia: Secondary | ICD-10-CM | POA: Diagnosis not present

## 2017-01-06 DIAGNOSIS — E038 Other specified hypothyroidism: Secondary | ICD-10-CM | POA: Diagnosis not present

## 2017-01-06 DIAGNOSIS — G43009 Migraine without aura, not intractable, without status migrainosus: Secondary | ICD-10-CM | POA: Diagnosis not present

## 2017-01-06 DIAGNOSIS — C539 Malignant neoplasm of cervix uteri, unspecified: Secondary | ICD-10-CM | POA: Diagnosis not present

## 2017-01-06 DIAGNOSIS — Z1389 Encounter for screening for other disorder: Secondary | ICD-10-CM | POA: Diagnosis not present

## 2017-01-06 DIAGNOSIS — C50919 Malignant neoplasm of unspecified site of unspecified female breast: Secondary | ICD-10-CM | POA: Diagnosis not present

## 2017-01-06 DIAGNOSIS — E784 Other hyperlipidemia: Secondary | ICD-10-CM | POA: Diagnosis not present

## 2017-01-07 DIAGNOSIS — R87612 Low grade squamous intraepithelial lesion on cytologic smear of cervix (LGSIL): Secondary | ICD-10-CM | POA: Diagnosis not present

## 2017-01-07 DIAGNOSIS — B373 Candidiasis of vulva and vagina: Secondary | ICD-10-CM | POA: Diagnosis not present

## 2017-01-07 DIAGNOSIS — R87610 Atypical squamous cells of undetermined significance on cytologic smear of cervix (ASC-US): Secondary | ICD-10-CM | POA: Diagnosis not present

## 2017-01-28 DIAGNOSIS — R141 Gas pain: Secondary | ICD-10-CM | POA: Diagnosis not present

## 2017-01-28 DIAGNOSIS — M545 Low back pain: Secondary | ICD-10-CM | POA: Diagnosis not present

## 2017-01-29 ENCOUNTER — Other Ambulatory Visit: Payer: Self-pay | Admitting: Gastroenterology

## 2017-01-29 DIAGNOSIS — R1084 Generalized abdominal pain: Secondary | ICD-10-CM

## 2017-01-29 DIAGNOSIS — R141 Gas pain: Secondary | ICD-10-CM

## 2017-02-03 MED FILL — SYNTHROID 100 MCG TABLET: 100 | 90 days supply | Qty: 90 | Fill #3

## 2017-02-03 MED FILL — ESZOPICLONE 3 MG TABLET: 3 | 30 days supply | Qty: 30 | Fill #1

## 2017-02-04 MED FILL — DEXTROAMPHETAMINE 5 MG TAB: 5 | 90 days supply | Qty: 540 | Fill #0

## 2017-02-07 ENCOUNTER — Other Ambulatory Visit: Payer: Self-pay | Admitting: Plastic Surgery

## 2017-02-07 DIAGNOSIS — Z9012 Acquired absence of left breast and nipple: Secondary | ICD-10-CM

## 2017-02-07 DIAGNOSIS — Z9889 Other specified postprocedural states: Secondary | ICD-10-CM | POA: Diagnosis not present

## 2017-02-10 ENCOUNTER — Ambulatory Visit (HOSPITAL_COMMUNITY)
Admission: RE | Admit: 2017-02-10 | Discharge: 2017-02-10 | Disposition: A | Discharge status: Home / Self Care | Payer: 59 | Source: Ambulatory Visit | Attending: Gastroenterology | Admitting: Gastroenterology

## 2017-02-10 ENCOUNTER — Encounter (HOSPITAL_COMMUNITY)
Admission: RE | Admit: 2017-02-10 | Discharge: 2017-02-10 | Disposition: A | Discharge status: Home / Self Care | Payer: 59 | Source: Ambulatory Visit | Attending: Gastroenterology | Admitting: Gastroenterology

## 2017-02-10 DIAGNOSIS — R109 Unspecified abdominal pain: Secondary | ICD-10-CM | POA: Diagnosis not present

## 2017-02-10 DIAGNOSIS — R1084 Generalized abdominal pain: Secondary | ICD-10-CM

## 2017-02-10 DIAGNOSIS — R141 Gas pain: Secondary | ICD-10-CM | POA: Insufficient documentation

## 2017-02-10 MED ORDER — TECHNETIUM TC 99M MEBROFENIN IV KIT
4.8000 | PACK | Freq: Once | INTRAVENOUS | Status: AC | PRN
  Administered 2017-02-10: 4.8 via INTRAVENOUS

## 2017-02-11 DIAGNOSIS — Z6826 Body mass index (BMI) 26.0-26.9, adult: Secondary | ICD-10-CM | POA: Diagnosis not present

## 2017-02-11 DIAGNOSIS — R05 Cough: Secondary | ICD-10-CM | POA: Diagnosis not present

## 2017-02-11 DIAGNOSIS — J209 Acute bronchitis, unspecified: Secondary | ICD-10-CM | POA: Diagnosis not present

## 2017-02-11 MED FILL — AZITHROMYCIN 250 MG TABLET: 250 | 5 days supply | Qty: 6 | Fill #0

## 2017-02-12 ENCOUNTER — Ambulatory Visit
Admission: RE | Admit: 2017-02-12 | Discharge: 2017-02-12 | Disposition: A | Discharge status: Home / Self Care | Payer: 59 | Source: Ambulatory Visit | Attending: Plastic Surgery | Admitting: Plastic Surgery

## 2017-02-12 DIAGNOSIS — N644 Mastodynia: Secondary | ICD-10-CM | POA: Diagnosis not present

## 2017-02-12 DIAGNOSIS — Z9012 Acquired absence of left breast and nipple: Secondary | ICD-10-CM

## 2017-02-19 ENCOUNTER — Other Ambulatory Visit: Payer: 59

## 2017-02-25 ENCOUNTER — Other Ambulatory Visit: Payer: Self-pay | Admitting: Plastic Surgery

## 2017-02-25 DIAGNOSIS — R609 Edema, unspecified: Secondary | ICD-10-CM

## 2017-02-25 DIAGNOSIS — R52 Pain, unspecified: Secondary | ICD-10-CM

## 2017-03-05 MED FILL — SCOPOLAMINE 1 MG/3 DAY PATC: 1 | 9 days supply | Qty: 3 | Fill #0

## 2017-03-06 ENCOUNTER — Ambulatory Visit
Admission: RE | Admit: 2017-03-06 | Discharge: 2017-03-06 | Disposition: A | Discharge status: Home / Self Care | Payer: 59 | Source: Ambulatory Visit | Attending: Plastic Surgery | Admitting: Plastic Surgery

## 2017-03-06 ENCOUNTER — Other Ambulatory Visit: Payer: 59

## 2017-03-06 DIAGNOSIS — R609 Edema, unspecified: Secondary | ICD-10-CM

## 2017-03-06 DIAGNOSIS — Z853 Personal history of malignant neoplasm of breast: Secondary | ICD-10-CM | POA: Diagnosis not present

## 2017-03-06 DIAGNOSIS — R52 Pain, unspecified: Secondary | ICD-10-CM

## 2017-03-06 MED ORDER — GADOBENATE DIMEGLUMINE 529 MG/ML IV SOLN
14.0000 mL | Freq: Once | INTRAVENOUS | Status: AC | PRN
  Administered 2017-03-06: 14 mL via INTRAVENOUS

## 2017-03-10 DIAGNOSIS — H524 Presbyopia: Secondary | ICD-10-CM | POA: Diagnosis not present

## 2017-03-17 MED FILL — CEPHALEXIN 500 MG CAPSULE: 500 | 5 days supply | Qty: 20 | Fill #0

## 2017-04-02 MED FILL — ESZOPICLONE 3 MG TABLET: 3 | 30 days supply | Qty: 30 | Fill #2

## 2017-04-21 MED FILL — valACYclovir HCL 1 GM TABS: 1 | 30 days supply | Qty: 30 | Fill #0

## 2017-04-21 MED FILL — SYNTHROID 100 MCG TABLET: 100 | 90 days supply | Qty: 90 | Fill #0

## 2017-05-21 MED FILL — valACYclovir HCL 1 GM TABS: 1 | 30 days supply | Qty: 30 | Fill #1

## 2017-05-21 MED FILL — ESZOPICLONE 3 MG TABLET: 3 | 30 days supply | Qty: 30 | Fill #3

## 2017-06-10 MED FILL — DEXTROAMPHETAMINE 10 MG TAB: 10 | 90 days supply | Qty: 270 | Fill #0

## 2017-06-12 ENCOUNTER — Encounter: Payer: 59 | Admitting: Adult Health

## 2017-06-17 NOTE — Progress Notes (Signed)
CLINIC:  Survivorship   REASON FOR VISIT:  Routine follow-up for history of breast cancer.   BRIEF ONCOLOGIC HISTORY:    Breast cancer, left breast ([place]) (Resolved)    Initial Diagnosis    Breast cancer, left breast      12/04/2012 Initial Biopsy    Left breast needle core biopsy (UOQ): Grade 3, DCIS with necrosis and calcs. ER+ (100%), PR+ (86%).        Breast cancer of upper-outer quadrant of left female breast ([place])   12/04/2012 Initial Biopsy    LEFT breast needle core biopsy (UOQ): Grade 3, DCIS with necrosis and calcs. ER+ (100%), PR+ (86%).       12/14/2012 Breast MRI    LEFT breast: Clumped nodular enhancement measuring 7.2 x 3.2 x 2.6 cm. Also associated post-biopsy change. RIGHT breast: At 12 o'clock location, there is 27m slightly irregular mass-like area of abnml enhancement. No lymphadenopathy.       12/25/2012 Initial Biopsy    RIGHT breast needle core biopsy (upper centeral): Grade 1-2, IDC, DCIS with calcs. ER+ (100%), PR+ (100%), HER2- (ratio 1.23). Ki67 4%.       12/25/2012 Initial Diagnosis    Bilateral breast cancer      12/31/2012 Surgery    Bilateral mastectomies with bilat SLNB ([facility]. LEFT: Grade 2, IDC, 2.3 cm. (+) LVI. Grade 3 DCIS w/comedonecrosis & calcs. (-) margins. 1 left ax SLN neg.  ER+ (99%), PR+ (10%), HER2 repeated and (+) ratio (6.81). Ki67 46%. RIGHT: Benign, 1 SLN neg.      12/31/2012 Pathologic Stage    LEFT breast: pT2, pN0, pMx. Stage IIA.       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 EF 55-60%      02/04/2013 - 05/13/2013 Adjuvant Chemotherapy    Taxotere/Carbo/Herceptin q 3 wks x 5 cycles completed. (planned for 6 cycles but d/c'd early due to pt wishes and recent infection).       05/2013 -  Anti-estrogen oral therapy    Attempted to take Letrozole, Aromasin, & Tamoxifen but patient could not tolerate any anti-estrogen  therapy due to severe depressive symptoms and joint/muscle aches & pains.  No longer taking anti-estrogen therapy.      09/29/2013 Surgery    LEFT breast reconstruction with lat flap and placement of tissue expander ([facility]).       11/26/2013 Imaging    CT chest: No definite CT findings for chest wall recurrence and no suspicious supraclavicular or axillary adenopathy.  Negative for metastatic disease in lungs or bones.        - 02/16/2014 Adjuvant Chemotherapy    Herceptin maintenance x 1 year completed.       03/09/2014 Surgery    Removal of bilateral tissue expanders and placement of bilateral breast implants ([place]).       03/09/2014 Procedure    Right scar biopsy: No malignancy & Left breast capsule biopsy: No evidence of malignancy.       08/22/2014 Echocardiogram    Post-treatment. EF 55-60%      06/30/2015 Survivorship    Survivorship Care Plan given to patient and reviewed with her during in-person visit.         INTERVAL HISTORY:  Ms. JRoedigerpresents to the Survivorship Clinic today for routine follow-up for her history of breast cancer.  Overall, she reports feeling quite well. She sees her GYN and PCP regularly.  She is exercising and is up to date with her cancer screenings.  She did have one episode where she feels like she tore some scar tissue under her left breast at the beginning of the year.  She underwent ultrasound and MRI both of which did not reveal any concern.  She is exercising and eating well.      REVIEW OF SYSTEMS:  Review of Systems  Constitutional: Negative for appetite change, chills, fatigue, fever and unexpected weight change.  HENT:   Negative for hearing loss and lump/mass.   Eyes: Negative for eye problems and icterus.  Respiratory: Negative for chest tightness, cough and shortness of breath.   Cardiovascular: Negative for chest pain, leg swelling and palpitations.  Gastrointestinal: Negative for abdominal distention and abdominal pain.    Endocrine: Negative for hot flashes.  Musculoskeletal: Negative for arthralgias and gait problem.  Skin: Negative for itching and rash.  Neurological: Negative for dizziness, extremity weakness, gait problem, headaches and numbness.  Hematological: Negative for adenopathy. Does not bruise/bleed easily.  Psychiatric/Behavioral: Negative for depression. The patient is not nervous/anxious.   Breast: Denies any new nodularity, masses, tenderness, nipple changes, or nipple discharge.       PAST MEDICAL/SURGICAL HISTORY:  Past Medical History:  Diagnosis Date  . Abnormally small mouth   . ADD (attention deficit disorder)   . Allergy   . Asthma    prn inhaler  . Blood transfusion without reported diagnosis   . Breast cancer ([place])   . Dental crowns present   . Difficulty swallowing pills   . History of breast cancer   . History of cervical cancer   . History of palpitations    last echo 08/22/2014  . Hypothyroidism   . IBS (irritable bowel syndrome)    Past Surgical History:  Procedure Laterality Date  . ABDOMINAL HYSTERECTOMY    . ABDOMINOPLASTY  03/28/2004  . AXILLARY SENTINEL NODE BIOPSY  12/31/2012   Procedure: AXILLARY SENTINEL NODE BIOPSY;  Surgeon: Rolm Bookbinder, MD;  Location: [place];  Service: General;  Laterality: Bilateral;  bilateral sentinel node  . BRAIN SURGERY     removal benign frontal lobe cyst in 1987  . BREAST ENHANCEMENT SURGERY Bilateral ~ 2008  . BREAST RECONSTRUCTION WITH PLACEMENT OF TISSUE EXPANDER AND FLEX HD (ACELLULAR HYDRATED DERMIS)  12/31/2012   Procedure: BREAST RECONSTRUCTION WITH PLACEMENT OF TISSUE EXPANDER AND FLEX HD (ACELLULAR HYDRATED DERMIS);  Surgeon: Theodoro Kos, DO;  Location: [place];  Service: Plastics;  Laterality: Bilateral;  bilateral immediate breast reconstruction with expanders and flex hd   . CAPSULOTOMY Left 09/01/2014   Procedure: CAPSULOTOMY OF LEFT BREAST WITH LIPOFILLING FOR SYMMETRY;   Surgeon: Theodoro Kos, DO;  Location: [place];  Service: Plastics;  Laterality: Left;  . CERVICAL CERCLAGE    . DIAGNOSTIC LAPAROSCOPY  05/12/2003   right partial salpingectomy; lysis paraovarian adhesions left  . DILATION AND CURETTAGE OF UTERUS  2005  . ENDOMETRIAL FULGURATION  05/12/2003  . FACIAL COSMETIC SURGERY  ~ 2010  . FOOT SURGERY Bilateral 07/2012  . GYNECOLOGIC CRYOSURGERY  1998  . INCISION AND DRAINAGE OF WOUND Left 04/28/2013   Procedure: IRRIGATION AND DEBRIDEMENT LEFT BREAST WOUND, POSSIBLE CLOSURE OF WOUND WITH DRAIN PLACEMENT;  Surgeon: Theodoro Kos, DO;  Location: [place];  Service: Plastics;  Laterality: Left;  . LATISSIMUS FLAP TO BREAST Left 09/29/2013   Procedure: LATISSIMUS FLAP TO BREAST WITH TISSUE EXPANDER PLACEMENT FOR LEFT BREAST RECONSTRUCTION;  Surgeon: Theodoro Kos, DO;  Location: [place];  Service: Plastics;  Laterality: Left;  . LIPECTOMY Bilateral 03/28/2004   hip  . LIPOSUCTION Bilateral 03/09/2014   Procedure: LIPOSUCTION ABDOMEN W/ LIPOFILLING LATERAL BILATERAL BREAST;  Surgeon: Theodoro Kos, DO;  Location: [place];  Service: Plastics;  Laterality: Bilateral;  LIPOSUCTION NECK  . LIPOSUCTION WITH LIPOFILLING Bilateral 09/01/2014   Procedure: LIPOSUCTION WITH LIPOFILLING;  Surgeon: Theodoro Kos, DO;  Location: [place];  Service: Plastics;  Laterality: Bilateral;  . LYSIS OF ADHESION  03/28/2004   pelvic  . MASTECTOMY    . PORT-A-CATH REMOVAL Right 03/09/2014   Procedure: REMOVAL PORT-A-CATH;  Surgeon: Theodoro Kos, DO;  Location: [place];  Service: Plastics;  Laterality: Right;  . PORTACATH PLACEMENT  01/27/2013   Procedure: INSERTION PORT-A-CATH;  Surgeon: Rolm Bookbinder, MD;  Location: [place];  Service: General;  Laterality: Right;  . RADIOFREQUENCY ABLATION NERVES  05/12/2003   uterosacral nerve ablation  . REMOVAL OF BILATERAL TISSUE EXPANDERS WITH  PLACEMENT OF BILATERAL BREAST IMPLANTS Bilateral 03/09/2014   Procedure: REMOVAL OF BILATERAL TISSUE EXPANDERS WITH PLACEMENT OF BILATERAL BREAST IMPLANTS;  Surgeon: Theodoro Kos, DO;  Location: [place];  Service: Plastics;  Laterality: Bilateral;  . TEE WITHOUT CARDIOVERSION N/A 06/04/2013   Procedure: TRANSESOPHAGEAL ECHOCARDIOGRAM (TEE);  Surgeon: Jolaine Artist, MD;  Location[facility] ENDOSCOPY;  Service: Cardiovascular;  Laterality: N/A;  . TISSUE EXPANDER PLACEMENT Left 06/16/2013   Procedure: LEFT BREAST TISSUE EXPANDER REMOVAL;  Surgeon: Theodoro Kos, DO;  Location: [place];  Service: Plastics;  Laterality: Left;  . TISSUE EXPANDER PLACEMENT Left 09/29/2013   Procedure: TISSUE EXPANDER;  Surgeon: Theodoro Kos, DO;  Location: [place];  Service: Plastics;  Laterality: Left;  . TOTAL ABDOMINAL HYSTERECTOMY W/ BILATERAL SALPINGOOPHORECTOMY  03/28/2004  . TOTAL MASTECTOMY  12/31/2012   Procedure: TOTAL MASTECTOMY;  Surgeon: Rolm Bookbinder, MD;  Location: [place];  Service: General;  Laterality: Bilateral;  bilateral total mastectomies     ALLERGIES:  Allergies  Allergen Reactions  . Betadine [Povidone Iodine] Anaphylaxis  . Iodine Anaphylaxis  . Shellfish-Derived Products Anaphylaxis  . Cleocin [Clindamycin Hcl] Rash  . Ginger Rash  . Penicillins Rash  . Sulfamethoxazole-Trimethoprim Rash     CURRENT MEDICATIONS:  Outpatient Encounter Prescriptions as of 06/18/2017  Medication Sig Note  . albuterol (PROVENTIL) (2.5 MG/3ML) 0.083% nebulizer solution Take 2.5 mg by nebulization every 6 (six) hours as needed for wheezing or shortness of breath.   [name] aspirin 81 MG tablet Take 81 mg by mouth daily.   . B Complex-C (B-COMPLEX WITH VITAMIN C) tablet Take 1 tablet by mouth daily.   . Biotin 1 MG CAPS Take 1 capsule by mouth daily.   [name] dextroamphetamine (DEXTROSTAT) 5 MG tablet Take 5 mg by mouth 4 (four) times daily as needed.    [name]  EPINEPHrine 0.3 mg/0.3 mL IJ SOAJ injection Inject 0.3 mLs (0.3 mg total) into the muscle once. (Patient taking differently: Inject 0.3 mg into the muscle once. As needed)   . Eszopiclone 3 MG TABS TAKE 1 TABLET BY MOUTH IMMEDIATELY BEFORE BEDTIME   . hyoscyamine (ANASPAZ) 0.125 MG TBDP disintergrating tablet Place 0.125 mg under the tongue as needed.    . levalbuterol (XOPENEX HFA) 45 MCG/ACT inhaler Inhale 2 puffs into the lungs every 6 (six) hours as needed for wheezing.   . Multiple Vitamin (MULTIVITAMIN WITH MINERALS) TABS Take 1 tablet by mouth daily.   [name]  rifaximin (XIFAXAN) 200 MG tablet As needed 12/07/2015: Received from: [facility]  . SYNTHROID 100 MCG tablet TAKE 1 TABLET BY MOUTH DAILY.   . valACYclovir (VALTREX) 500 MG tablet Take 250 mg by mouth daily.   . vitamin E 600 UNIT capsule Take by mouth. 06/28/2016: Received from: [facility]   No facility-administered encounter medications on file as of 06/18/2017.      ONCOLOGIC FAMILY HISTORY:  Family History  Problem Relation Age of Onset  . Cancer Mother 52       uterine, mult myeloma  . CVA Father   . Cancer Sister 20       breast  . Cancer Maternal Grandmother 81       breast  . Lupus Sister        twin sister  . Sarcoidosis Sister        twin sister  . Rheum arthritis Sister        twin sister  . Celiac disease Daughter     SOCIAL HISTORY:  MYNA FREIMARK is married and lives with her husband in [place], [place].  She has 3 step-children.  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  She denies any current or history of tobacco, alcohol, or illicit drug use.     PHYSICAL EXAMINATION:  Vital Signs: Vitals:   06/18/17 0818  BP: 119/73  Pulse: 67  Resp: 18  Temp: 97.8 F (36.6 C)   Filed Weights   06/18/17 0818  Weight: 152 lb 14.4 oz (69.4 kg)   General: Well-nourished, well-appearing female in no acute distress.  Unaccompanied  today.   HEENT: Head is normocephalic.  Pupils equal and reactive to light. Conjunctivae clear without exudate.  Sclerae anicteric. Oral mucosa is pink, moist.  Oropharynx is pink without lesions or erythema.  Lymph: No cervical, supraclavicular, or infraclavicular lymphadenopathy noted on palpation.  Cardiovascular: Regular rate and rhythm.[name] Respiratory: Clear to auscultation bilaterally. Chest expansion symmetric; breathing non-labored.  Breast Exam:  Breasts: s/p mastectomy and latissimus flap reconstruction, no swelling, skin change, mass or nodularity noted bilaterally.  Scars are well healed.   -Axilla: No axillary adenopathy bilaterally.  GI: Abdomen soft and round; non-tender, non-distended. Bowel sounds normoactive. No hepatosplenomegaly.   GU: Deferred.  Neuro: No focal deficits. Steady gait.  Psych: Mood and affect normal and appropriate for situation.  MSK: No focal spinal tenderness to palpation, full range of motion in bilateral upper extremities Extremities: No edema. Skin: Warm and dry.  LABORATORY DATA:  None for this visit   DIAGNOSTIC IMAGING:  Most recent mammogram:  n/a   ASSESSMENT AND PLAN:  Ms.. Upadhyay is a pleasant 61 y.o. CHEK-2 positive female with history of Stage IIA left breast invasive ductal carcinoma, ER+/PR+/HER2+, diagnosed in 11/2012, treated with bilateral mastectomies, adjuvant chemotherapy, maintenance Herceptin, and anti-estrogen therapy was attempted, however she could not tolerate any of the modalities.  She presents to the Survivorship Clinic for surveillance and routine follow-up.   1. History of breast cancer:  Ms. Abruzzese is currently clinically and radiographically without evidence of disease or recurrence of breast cancer.  She will return to the cancer center to see her medical oncologist, Dr. Lindi Adie, in 11/2017.  I encouraged her to call me with any questions or concerns before her next visit at the cancer center, and I would be happy to see  her sooner, if needed.    2. Bone health:  Given Ms. Zupko age, history of breast cancer, she is at slight risk for bone demineralization.  She was given education on specific food and activities to promote bone health.  I will defer to her PCP or GYN regarding bone density testing and management.   3. Cancer screening:  Due to Ms. Chisom history and her age, she should receive screening for skin cancers, colon cancer, and gynecologic cancers. She was encouraged to follow-up with her PCP for appropriate cancer screenings.   4. Health maintenance and wellness promotion: Ms. Glauber was encouraged to consume 5-7 servings of fruits and vegetables per day. She was also encouraged to engage in moderate to vigorous exercise for 30 minutes per day most days of the week. She was instructed to limit her alcohol consumption and continue to abstain from tobacco use.    Dispo:  -Return to cancer center in 11/2017 for follow up with Dr. Lindi Adie -f/u with LTS clinic in 05/2018   A total of (30) minutes of face-to-face time was spent with this patient with greater than 50% of that time in counseling and care-coordination.   Gardenia Phlegm, NP Survivorship Program [facility] [phone]   Note: PRIMARY CARE PROVIDER Reynold Bowen, [place] [phone]

## 2017-06-18 ENCOUNTER — Ambulatory Visit (HOSPITAL_BASED_OUTPATIENT_CLINIC_OR_DEPARTMENT_OTHER): Payer: 59 | Admitting: Adult Health

## 2017-06-18 ENCOUNTER — Encounter: Payer: Self-pay | Admitting: Adult Health

## 2017-06-18 VITALS — BP 119/73 | HR 67 | Temp 97.8°F | Resp 18 | Ht 65.0 in | Wt 152.9 lb

## 2017-06-18 DIAGNOSIS — Z853 Personal history of malignant neoplasm of breast: Secondary | ICD-10-CM

## 2017-06-18 DIAGNOSIS — Z170 Estrogen receptor positive status [ER+]: Principal | ICD-10-CM

## 2017-06-18 DIAGNOSIS — C50412 Malignant neoplasm of upper-outer quadrant of left female breast: Secondary | ICD-10-CM

## 2017-07-01 ENCOUNTER — Other Ambulatory Visit: Payer: Self-pay | Admitting: Hematology and Oncology

## 2017-07-03 DIAGNOSIS — Z01419 Encounter for gynecological examination (general) (routine) without abnormal findings: Secondary | ICD-10-CM | POA: Diagnosis not present

## 2017-07-03 DIAGNOSIS — Z6824 Body mass index (BMI) 24.0-24.9, adult: Secondary | ICD-10-CM | POA: Diagnosis not present

## 2017-07-07 MED FILL — valACYclovir HCL 1 GM TABS: 1 | 90 days supply | Qty: 45 | Fill #0

## 2017-07-07 MED FILL — ESZOPICLONE 3 MG TABLET: 3 | 30 days supply | Qty: 30 | Fill #0

## 2017-07-07 MED FILL — NITROFURANTOIN MONO-MCR 100: 100 | 30 days supply | Qty: 30 | Fill #0

## 2017-07-18 MED FILL — SYNTHROID 100 MCG TABLET: 100 | 90 days supply | Qty: 90 | Fill #1

## 2017-08-05 DIAGNOSIS — H5213 Myopia, bilateral: Secondary | ICD-10-CM | POA: Diagnosis not present

## 2017-08-12 DIAGNOSIS — K219 Gastro-esophageal reflux disease without esophagitis: Secondary | ICD-10-CM | POA: Diagnosis not present

## 2017-08-12 DIAGNOSIS — R194 Change in bowel habit: Secondary | ICD-10-CM | POA: Diagnosis not present

## 2017-08-12 DIAGNOSIS — R140 Abdominal distension (gaseous): Secondary | ICD-10-CM | POA: Diagnosis not present

## 2017-08-14 MED FILL — VENTOLIN HFA 90 MCG INHALER: 108 (90 BAS | 25 days supply | Qty: 18 | Fill #0

## 2017-08-15 MED FILL — EPINEPHRINE 0.3 MG AUTO-INJ: 0.3 | 2 days supply | Qty: 2 | Fill #0

## 2017-08-18 DIAGNOSIS — J328 Other chronic sinusitis: Secondary | ICD-10-CM | POA: Diagnosis not present

## 2017-08-18 DIAGNOSIS — R062 Wheezing: Secondary | ICD-10-CM | POA: Insufficient documentation

## 2017-08-18 DIAGNOSIS — J329 Chronic sinusitis, unspecified: Secondary | ICD-10-CM | POA: Insufficient documentation

## 2017-08-18 DIAGNOSIS — Z6826 Body mass index (BMI) 26.0-26.9, adult: Secondary | ICD-10-CM | POA: Diagnosis not present

## 2017-08-18 DIAGNOSIS — J029 Acute pharyngitis, unspecified: Secondary | ICD-10-CM | POA: Diagnosis not present

## 2017-08-18 MED FILL — CEFDINIR 300 MG CAPSULE: 300 | 10 days supply | Qty: 20 | Fill #0

## 2017-08-21 ENCOUNTER — Encounter (INDEPENDENT_AMBULATORY_CARE_PROVIDER_SITE_OTHER): Payer: Self-pay | Admitting: Orthopedic Surgery

## 2017-08-21 ENCOUNTER — Ambulatory Visit (INDEPENDENT_AMBULATORY_CARE_PROVIDER_SITE_OTHER): Payer: 59 | Admitting: Orthopedic Surgery

## 2017-08-21 ENCOUNTER — Ambulatory Visit (INDEPENDENT_AMBULATORY_CARE_PROVIDER_SITE_OTHER): Payer: 59

## 2017-08-21 DIAGNOSIS — M79641 Pain in right hand: Secondary | ICD-10-CM | POA: Diagnosis not present

## 2017-08-21 MED FILL — levoFLOXacin 500 MG TABS: 500 | 7 days supply | Qty: 7 | Fill #0

## 2017-08-22 LAB — ANTI-NUCLEAR AB-TITER (ANA TITER)

## 2017-08-22 LAB — ANA: Anti Nuclear Antibody(ANA): POSITIVE — AB

## 2017-08-22 LAB — RHEUMATOID FACTOR: Rhuematoid fact SerPl-aCnc: <14 IU/mL (ref <14)

## 2017-08-22 LAB — C-REACTIVE PROTEIN: CRP: 0.5 mg/L (ref <8.0)

## 2017-08-22 LAB — CYCLIC CITRUL PEPTIDE ANTIBODY, IGG: Cyclic Citrullin Peptide Ab: <16 Units

## 2017-08-22 LAB — SEDIMENTATION RATE: Sed Rate: 4 mm/hr (ref 0–30)

## 2017-08-24 NOTE — Progress Notes (Signed)
Office Visit Note   Patient: Jane Gutierrez           Date of Birth: [birth year]           MRN: [id number] Visit Date: 08/21/2017 Requested by: Reynold Bowen, MD [place], [place] [address] PCP: Reynold Bowen, MD  Subjective: Chief Complaint  Patient presents with  . Right Hand - Pain    HPI: Jane Gutierrez is a 61 year old female with right hand pain.  Been going on for a month.  Reports tenderness to touch.  She is left-hand dominant.  Been taking anti-inflammatories as needed.  She localizes the pain to the piece of form area.  She does ride the bike about 4 times a week.  She does have a twin Jane Gutierrez with rheumatoid arthritis and sarcoidosis.  She is concerned about developing autoimmune diseases.  She does have a history of breast cancer.              ROS: All systems reviewed are negative as they relate to the chief complaint within the history of present illness.  Patient denies  fevers or chills.   Assessment & Plan: Visit Diagnoses:  1. Pain in right hand     Plan: Impression is right hand pain with no evidence of definitive problem on plain radiographs or examination.  Plan is to draw a rheumatoid factor sedimentation rate C-reactive protein ANA and antinuclear CCP antibody today.  I don't think there is anything definitively to inject today in the hand.  Motor sensory function particularly of the median and ulnar nerve is intact.  follow-Up Instructions: Return if symptoms worsen or fail to improve.   Orders:  Orders Placed This Encounter  Procedures  . XR Hand Complete Right  . Rheumatoid factor  . Sed Rate (ESR)  . C-reactive protein  . Antinuclear Antib (ANA)  . Cyclic citrul peptide antibody, IgG  . Anti-nuclear ab-titer (ANA titer)   No orders of the defined types were placed in this encounter.     Procedures: No procedures performed   Clinical Data: No additional findings.  Objective: Vital Signs: There were no vitals taken for this  visit.  Physical Exam:   Constitutional: Patient appears well-developed HEENT:  Head: Normocephalic Eyes:EOM are normal Neck: Normal range of motion Cardiovascular: Normal rate Pulmonary/chest: Effort normal Neurologic: Patient is alert Skin: Skin is warm Psychiatric: Patient has normal mood and affect    Ortho Exam: Orthopedic exam demonstrates full active and passive range of motion of the wrist and fingers.  She has symmetric grip strength and palpable radial pulses.  No other masses lymph adenopathy or skin changes noted in the right hand region.  CMC grind test is negative.  No dorsal tenderness over the first dorsal compartment.  No snuffbox tenderness.  Specialty Comments:  No specialty comments available.  Imaging: No results found.   PMFS History: Patient Active Problem List   Diagnosis Date Noted  . Dyspnea 09/11/2015  . Breast cancer of upper-outer quadrant of left female breast ([place]) 06/28/2015  . Right hip pain 09/19/2014  . Right knee pain 09/19/2014  . Dyspnea on exertion 08/08/2014  . Heart palpitations 08/08/2014  . Acquired absence of breast and absent nipple 03/09/2014  . Pre-operative cardiovascular examination 09/09/2013  . Other chronic pulmonary heart diseases 05/27/2013  . SIRS (systemic inflammatory response syndrome) ([place]) 04/14/2013  . Anemia 04/14/2013  . Dehydration 04/14/2013  . Fever 04/14/2013  . Allergy   . IBS (irritable bowel syndrome)   .  Thyroid disease   . ADD (attention deficit disorder)   . Lyme disease   . Asthma   . Hypothyroidism   . Contact lens/glasses fitting    Past Medical History:  Diagnosis Date  . Abnormally small mouth   . ADD (attention deficit disorder)   . Allergy   . Asthma    prn inhaler  . Blood transfusion without reported diagnosis   . Breast cancer ([place])   . Dental crowns present   . Difficulty swallowing pills   . History of breast cancer   . History of cervical cancer   . History of  palpitations    last echo 08/22/2014  . Hypothyroidism   . IBS (irritable bowel syndrome)     Family History  Problem Relation Age of Onset  . Cancer Mother 67       uterine, mult myeloma  . CVA Father   . Cancer Jane Gutierrez 85       breast  . Cancer Maternal Grandmother 50       breast  . Lupus Jane Gutierrez        twin Jane Gutierrez  . Sarcoidosis Jane Gutierrez        twin Jane Gutierrez  . Rheum arthritis Jane Gutierrez        twin Jane Gutierrez  . Celiac disease Daughter     Past Surgical History:  Procedure Laterality Date  . ABDOMINAL HYSTERECTOMY    . ABDOMINOPLASTY  03/28/2004  . AXILLARY SENTINEL NODE BIOPSY  12/31/2012   Procedure: AXILLARY SENTINEL NODE BIOPSY;  Surgeon: Rolm Bookbinder, MD;  Location: [place];  Service: General;  Laterality: Bilateral;  bilateral sentinel node  . BRAIN SURGERY     removal benign frontal lobe cyst in 1987  . BREAST ENHANCEMENT SURGERY Bilateral ~ 2008  . BREAST RECONSTRUCTION WITH PLACEMENT OF TISSUE EXPANDER AND FLEX HD (ACELLULAR HYDRATED DERMIS)  12/31/2012   Procedure: BREAST RECONSTRUCTION WITH PLACEMENT OF TISSUE EXPANDER AND FLEX HD (ACELLULAR HYDRATED DERMIS);  Surgeon: Theodoro Kos, DO;  Location: [place];  Service: Plastics;  Laterality: Bilateral;  bilateral immediate breast reconstruction with expanders and flex hd   . CAPSULOTOMY Left 09/01/2014   Procedure: CAPSULOTOMY OF LEFT BREAST WITH LIPOFILLING FOR SYMMETRY;  Surgeon: Theodoro Kos, DO;  Location: [place];  Service: Plastics;  Laterality: Left;  . CERVICAL CERCLAGE    . DIAGNOSTIC LAPAROSCOPY  05/12/2003   right partial salpingectomy; lysis paraovarian adhesions left  . DILATION AND CURETTAGE OF UTERUS  2005  . ENDOMETRIAL FULGURATION  05/12/2003  . FACIAL COSMETIC SURGERY  ~ 2010  . FOOT SURGERY Bilateral 07/2012  . GYNECOLOGIC CRYOSURGERY  1998  . INCISION AND DRAINAGE OF WOUND Left 04/28/2013   Procedure: IRRIGATION AND DEBRIDEMENT LEFT BREAST WOUND, POSSIBLE  CLOSURE OF WOUND WITH DRAIN PLACEMENT;  Surgeon: Theodoro Kos, DO;  Location: [place];  Service: Plastics;  Laterality: Left;  . LATISSIMUS FLAP TO BREAST Left 09/29/2013   Procedure: LATISSIMUS FLAP TO BREAST WITH TISSUE EXPANDER PLACEMENT FOR LEFT BREAST RECONSTRUCTION;  Surgeon: Theodoro Kos, DO;  Location: [place];  Service: Plastics;  Laterality: Left;  . LIPECTOMY Bilateral 03/28/2004   hip  . LIPOSUCTION Bilateral 03/09/2014   Procedure: LIPOSUCTION ABDOMEN W/ LIPOFILLING LATERAL BILATERAL BREAST;  Surgeon: Theodoro Kos, DO;  Location: [place];  Service: Plastics;  Laterality: Bilateral;  LIPOSUCTION NECK  . LIPOSUCTION WITH LIPOFILLING Bilateral 09/01/2014   Procedure: LIPOSUCTION WITH LIPOFILLING;  Surgeon: Theodoro Kos, DO;  Location:  [place];  Service: Plastics;  Laterality: Bilateral;  . LYSIS OF ADHESION  03/28/2004   pelvic  . MASTECTOMY    . PORT-A-CATH REMOVAL Right 03/09/2014   Procedure: REMOVAL PORT-A-CATH;  Surgeon: Theodoro Kos, DO;  Location: [place];  Service: Plastics;  Laterality: Right;  . PORTACATH PLACEMENT  01/27/2013   Procedure: INSERTION PORT-A-CATH;  Surgeon: Rolm Bookbinder, MD;  Location: [place];  Service: General;  Laterality: Right;  . RADIOFREQUENCY ABLATION NERVES  05/12/2003   uterosacral nerve ablation  . REMOVAL OF BILATERAL TISSUE EXPANDERS WITH PLACEMENT OF BILATERAL BREAST IMPLANTS Bilateral 03/09/2014   Procedure: REMOVAL OF BILATERAL TISSUE EXPANDERS WITH PLACEMENT OF BILATERAL BREAST IMPLANTS;  Surgeon: Theodoro Kos, DO;  Location: [place];  Service: Plastics;  Laterality: Bilateral;  . TEE WITHOUT CARDIOVERSION N/A 06/04/2013   Procedure: TRANSESOPHAGEAL ECHOCARDIOGRAM (TEE);  Surgeon: Jolaine Artist, MD;  Location: [facility] ENDOSCOPY;  Service: Cardiovascular;  Laterality: N/A;  . TISSUE EXPANDER PLACEMENT Left 06/16/2013   Procedure: LEFT BREAST  TISSUE EXPANDER REMOVAL;  Surgeon: Theodoro Kos, DO;  Location: [place];  Service: Plastics;  Laterality: Left;  . TISSUE EXPANDER PLACEMENT Left 09/29/2013   Procedure: TISSUE EXPANDER;  Surgeon: Theodoro Kos, DO;  Location: [place];  Service: Plastics;  Laterality: Left;  . TOTAL ABDOMINAL HYSTERECTOMY W/ BILATERAL SALPINGOOPHORECTOMY  03/28/2004  . TOTAL MASTECTOMY  12/31/2012   Procedure: TOTAL MASTECTOMY;  Surgeon: Rolm Bookbinder, MD;  Location: [place];  Service: General;  Laterality: Bilateral;  bilateral total mastectomies   Social History   Occupational History  . Not on file.   Social History Main Topics  . Smoking status: Never Smoker  . Smokeless tobacco: Never Used  . Alcohol use 0.0 oz/week     Comment: occasionally  . Drug use: No  . Sexual activity: Yes

## 2017-08-29 DIAGNOSIS — D225 Melanocytic nevi of trunk: Secondary | ICD-10-CM | POA: Diagnosis not present

## 2017-08-29 DIAGNOSIS — L821 Other seborrheic keratosis: Secondary | ICD-10-CM | POA: Diagnosis not present

## 2017-08-29 DIAGNOSIS — L820 Inflamed seborrheic keratosis: Secondary | ICD-10-CM | POA: Diagnosis not present

## 2017-08-29 DIAGNOSIS — D1801 Hemangioma of skin and subcutaneous tissue: Secondary | ICD-10-CM | POA: Diagnosis not present

## 2017-08-29 DIAGNOSIS — Z85828 Personal history of other malignant neoplasm of skin: Secondary | ICD-10-CM | POA: Diagnosis not present

## 2017-09-02 DIAGNOSIS — M79641 Pain in right hand: Secondary | ICD-10-CM | POA: Insufficient documentation

## 2017-09-02 DIAGNOSIS — Z6826 Body mass index (BMI) 26.0-26.9, adult: Secondary | ICD-10-CM | POA: Diagnosis not present

## 2017-09-02 DIAGNOSIS — E784 Other hyperlipidemia: Secondary | ICD-10-CM | POA: Diagnosis not present

## 2017-09-02 DIAGNOSIS — C539 Malignant neoplasm of cervix uteri, unspecified: Secondary | ICD-10-CM | POA: Diagnosis not present

## 2017-09-02 DIAGNOSIS — J069 Acute upper respiratory infection, unspecified: Secondary | ICD-10-CM | POA: Diagnosis not present

## 2017-09-02 DIAGNOSIS — D126 Benign neoplasm of colon, unspecified: Secondary | ICD-10-CM | POA: Diagnosis not present

## 2017-09-02 DIAGNOSIS — E038 Other specified hypothyroidism: Secondary | ICD-10-CM | POA: Diagnosis not present

## 2017-09-02 DIAGNOSIS — C50919 Malignant neoplasm of unspecified site of unspecified female breast: Secondary | ICD-10-CM | POA: Diagnosis not present

## 2017-09-04 ENCOUNTER — Other Ambulatory Visit (INDEPENDENT_AMBULATORY_CARE_PROVIDER_SITE_OTHER): Payer: Self-pay

## 2017-09-04 DIAGNOSIS — M2550 Pain in unspecified joint: Secondary | ICD-10-CM

## 2017-09-04 NOTE — Progress Notes (Unsigned)
Called s.w patient about labs and referral. Referral made to Dr Estanislado Pandy.

## 2017-09-04 NOTE — Progress Notes (Signed)
Please call patient with results. Thanksheumatoid labs negative but the one lab was positive which is likely nonspecific but because it was positive it would be good with her family history to be looked at by a rheumatolist.  Please refer her to Dr. Cyndie Chime R thanks

## 2017-09-16 MED FILL — ESZOPICLONE 3 MG TABS: 3 | 30 days supply | Qty: 30 | Fill #1

## 2017-10-01 MED FILL — DEXTROAMPHETAMINE 10 MG TAB: 10 | 90 days supply | Qty: 270 | Fill #0

## 2017-10-16 MED FILL — ESZOPICLONE 3 MG TABS: 3 | 30 days supply | Qty: 30 | Fill #2

## 2017-10-16 MED FILL — SYNTHROID 100 MCG TABLET: 100 | 90 days supply | Qty: 90 | Fill #2

## 2017-10-17 MED FILL — CEPHALEXIN 500 MG CAPSULE: 500 | 5 days supply | Qty: 20 | Fill #1

## 2017-10-20 ENCOUNTER — Telehealth (INDEPENDENT_AMBULATORY_CARE_PROVIDER_SITE_OTHER): Payer: Self-pay | Admitting: Orthopedic Surgery

## 2017-10-20 NOTE — Telephone Encounter (Signed)
Ok for referral?

## 2017-10-20 NOTE — Telephone Encounter (Signed)
Patient called stating that Dr. Arlean Hopping office can not get her in until March (She has an appointment 2/21 with Dr. Estanislado Pandy), she is wanting to know if Dr. Marlou Sa could refer her to [facility] since they have earlier availability.  CB#[phone].  Thank you

## 2017-10-21 NOTE — Telephone Encounter (Signed)
Can you please help with getting this referral switched over? Let me know what you need from me.

## 2017-10-21 NOTE — Telephone Encounter (Signed)
Y pls call thx

## 2017-10-22 NOTE — Telephone Encounter (Signed)
Referral faxed to [facility] rheumatology this am. pendng appt

## 2017-10-22 NOTE — Telephone Encounter (Signed)
IC discussed.  

## 2017-10-27 DIAGNOSIS — J209 Acute bronchitis, unspecified: Secondary | ICD-10-CM | POA: Insufficient documentation

## 2017-10-27 DIAGNOSIS — R059 Cough, unspecified: Secondary | ICD-10-CM | POA: Insufficient documentation

## 2017-10-27 DIAGNOSIS — R05 Cough: Secondary | ICD-10-CM | POA: Diagnosis not present

## 2017-10-27 DIAGNOSIS — Z6826 Body mass index (BMI) 26.0-26.9, adult: Secondary | ICD-10-CM | POA: Diagnosis not present

## 2017-11-18 DIAGNOSIS — Z9889 Other specified postprocedural states: Secondary | ICD-10-CM | POA: Diagnosis not present

## 2017-11-18 DIAGNOSIS — C50912 Malignant neoplasm of unspecified site of left female breast: Secondary | ICD-10-CM | POA: Diagnosis not present

## 2017-11-18 DIAGNOSIS — Z9013 Acquired absence of bilateral breasts and nipples: Secondary | ICD-10-CM | POA: Diagnosis not present

## 2017-11-20 MED FILL — ESZOPICLONE 3 MG TABS: 3 | 30 days supply | Qty: 30 | Fill #3

## 2017-12-08 ENCOUNTER — Encounter (HOSPITAL_BASED_OUTPATIENT_CLINIC_OR_DEPARTMENT_OTHER): Payer: Self-pay | Admitting: *Deleted

## 2017-12-08 ENCOUNTER — Other Ambulatory Visit: Payer: Self-pay

## 2017-12-08 ENCOUNTER — Ambulatory Visit: Payer: Self-pay | Admitting: Plastic Surgery

## 2017-12-08 DIAGNOSIS — Z9013 Acquired absence of bilateral breasts and nipples: Secondary | ICD-10-CM

## 2017-12-08 NOTE — Assessment & Plan Note (Signed)
Left breast invasive ductal carcinoma ER 100% PR 86% HER-2 positive ratio 6.81, Ki-67 46 % status post bilateral mastectomies followed by reconstruction status post 6 cycles of TCH followed by Herceptin maintenance completed 02/16/2014, try different antiestrogen therapies including Aromasin, letrozole, tamoxifen but could not tolerate any of them because of severe depression symptoms.   Surveillance: 1. Breast exam 12/09/17 normal axilla and supraclavicular areas, bilateral breast implants normal patient got nipple tattoos. Palpable nodularity related to oil cysts in the superior medial aspect of bilateral breasts 2. No role of imaging (MRI breast July 2016 was normal accompanied by CT chest abdomen and pelvis)  Survivorship: Patient exercises twice a day and stays very active. I encouraged her to continue to do her physical activities and exercises.  Return to clinic annually for physical exams and follow-up

## 2017-12-09 ENCOUNTER — Telehealth: Payer: Self-pay | Admitting: Adult Health

## 2017-12-09 ENCOUNTER — Ambulatory Visit (HOSPITAL_BASED_OUTPATIENT_CLINIC_OR_DEPARTMENT_OTHER): Payer: 59 | Admitting: Hematology and Oncology

## 2017-12-09 ENCOUNTER — Other Ambulatory Visit: Payer: Self-pay

## 2017-12-09 DIAGNOSIS — Z853 Personal history of malignant neoplasm of breast: Secondary | ICD-10-CM | POA: Diagnosis not present

## 2017-12-09 DIAGNOSIS — Z170 Estrogen receptor positive status [ER+]: Principal | ICD-10-CM

## 2017-12-09 DIAGNOSIS — C50412 Malignant neoplasm of upper-outer quadrant of left female breast: Secondary | ICD-10-CM

## 2017-12-09 MED ORDER — ESZOPICLONE 3 MG PO TABS: ORAL_TABLET | ORAL | 5 refills | Status: DC

## 2017-12-09 NOTE — Progress Notes (Signed)
Patient Care Team: Jane Bowen, MD as PCP - General (Endocrinology)  DIAGNOSIS:  Encounter Diagnosis  Name Primary?  . Malignant neoplasm of upper-outer quadrant of left breast in female, estrogen receptor positive ([place])     SUMMARY OF ONCOLOGIC HISTORY:   Breast cancer, left breast ([place]) (Resolved)    Initial Diagnosis    Breast cancer, left breast      12/04/2012 Initial Biopsy    Left breast needle core biopsy (UOQ): Grade 3, DCIS with necrosis and calcs. ER+ (100%), PR+ (86%).        Breast cancer of upper-outer quadrant of left female breast ([place])   12/04/2012 Initial Biopsy    LEFT breast needle core biopsy (UOQ): Grade 3, DCIS with necrosis and calcs. ER+ (100%), PR+ (86%).       12/14/2012 Breast MRI    LEFT breast: Clumped nodular enhancement measuring 7.2 x 3.2 x 2.6 cm. Also associated post-biopsy change. RIGHT breast: At 12 o'clock location, there is 43m slightly irregular mass-like area of abnml enhancement. No lymphadenopathy.       12/25/2012 Initial Biopsy    RIGHT breast needle core biopsy (upper centeral): Grade 1-2, IDC, DCIS with calcs. ER+ (100%), PR+ (100%), HER2- (ratio 1.23). Ki67 4%.       12/25/2012 Initial Diagnosis    Bilateral breast cancer      12/31/2012 Surgery    Bilateral mastectomies with bilat SLNB ([facility]. LEFT: Grade 2, IDC, 2.3 cm. (+) LVI. Grade 3 DCIS w/comedonecrosis & calcs. (-) margins. 1 left ax SLN neg.  ER+ (99%), PR+ (10%), HER2 repeated and (+) ratio (6.81). Ki67 46%. RIGHT: Benign, 1 SLN neg.      12/31/2012 Pathologic Stage    LEFT breast: pT2, pN0, pMx. Stage IIA.       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 EF 55-60%      02/04/2013 - 05/13/2013 Adjuvant Chemotherapy    Taxotere/Carbo/Herceptin q 3 wks x 5 cycles completed. (planned for 6 cycles but d/c'd early due to pt wishes and recent infection).       05/2013 -  Anti-estrogen oral therapy    Attempted to take Letrozole, Aromasin, & Tamoxifen but patient could not tolerate any anti-estrogen therapy due to severe depressive symptoms and joint/muscle aches & pains.  No longer taking anti-estrogen therapy.      09/29/2013 Surgery    LEFT breast reconstruction with lat flap and placement of tissue expander ([facility]).       11/26/2013 Imaging    CT chest: No definite CT findings for chest wall recurrence and no suspicious supraclavicular or axillary adenopathy.  Negative for metastatic disease in lungs or bones.        - 02/16/2014 Adjuvant Chemotherapy    Herceptin maintenance x 1 year completed.       03/09/2014 Surgery    Removal of bilateral tissue expanders and placement of bilateral breast implants ([facility]).       03/09/2014 Procedure    Right scar biopsy: No malignancy & Left breast capsule biopsy: No evidence of malignancy.       08/22/2014 Echocardiogram    Post-treatment. EF 55-60%      06/30/2015 Survivorship    Survivorship Care Plan given to patient and reviewed with her during in-person visit.        CHIEF COMPLIANT: Surveillance of left breast cancer  INTERVAL HISTORY: Jane DOMKEis a  61 year old with above-mentioned history of left breast cancer treated with bilateral mastectomies followed by adjuvant chemotherapy and Herceptin followed by breast reconstruction.  She was also noted to have a CHEK-2 mutation.  She is here for annual surveillance and checkup.  She could not tolerate antiestrogen therapy previously.  [name] is doing well today.  She is having her implants replaced on 12/11/2017 due to them being textured.  Today is her 5 year visit.  She is very excited about this visit.  She is exercising regularly.    REVIEW OF SYSTEMS:   Constitutional: Denies fevers, chills or abnormal weight loss Eyes: Denies blurriness of vision Ears, nose, mouth, throat, and face: Denies mucositis or sore throat Respiratory: Denies  cough, dyspnea or wheezes Cardiovascular: Denies palpitation, chest discomfort Gastrointestinal:  Denies nausea, heartburn or change in bowel habits Skin: Denies abnormal skin rashes Lymphatics: Denies new lymphadenopathy or easy bruising Neurological:Denies numbness, tingling or new weaknesses Behavioral/Psych: Mood is stable, no new changes  Extremities: No lower extremity edema Breast: Bilateral mastectomies with reconstruction All other systems were reviewed with the patient and are negative.  I have reviewed the past medical history, past surgical history, social history and family history with the patient and they are unchanged from previous note.  ALLERGIES:  is allergic to betadine [povidone iodine]; iodine; shellfish-derived products; cleocin [clindamycin hcl]; ginger; penicillins; and sulfamethoxazole-trimethoprim.  MEDICATIONS:  Current Outpatient Medications  Medication Sig Dispense Refill  . albuterol (PROVENTIL) (2.5 MG/3ML) 0.083% nebulizer solution Take 2.5 mg by nebulization every 6 (six) hours as needed for wheezing or shortness of breath.    . B Complex-C (B-COMPLEX WITH VITAMIN C) tablet Take 1 tablet by mouth daily.    . Biotin 1 MG CAPS Take 1 capsule by mouth daily.    [name] dextroamphetamine (DEXTROSTAT) 5 MG tablet Take 5 mg by mouth 4 (four) times daily as needed.     [name] EPINEPHrine 0.3 mg/0.3 mL IJ SOAJ injection Inject 0.3 mLs (0.3 mg total) into the muscle once. (Patient taking differently: Inject 0.3 mg into the muscle once. As needed) 1 Device 5  . Eszopiclone 3 MG TABS TAKE 1 TABLET BY MOUTH IMMEDIATELY BEFORE BEDTIME 30 tablet 5  . hyoscyamine (ANASPAZ) 0.125 MG TBDP disintergrating tablet Place 0.125 mg under the tongue as needed.     . Multiple Vitamin (MULTIVITAMIN WITH MINERALS) TABS Take 1 tablet by mouth daily.    [name] SYNTHROID 100 MCG tablet TAKE 1 TABLET BY MOUTH DAILY. 30 tablet 4  . valACYclovir (VALTREX) 500 MG tablet Take 250 mg by mouth daily.    .  vitamin E 600 UNIT capsule Take by mouth.     No current facility-administered medications for this visit.     PHYSICAL EXAMINATION: ECOG PERFORMANCE STATUS: 1 - Symptomatic but completely ambulatory  Vitals:   12/09/17 0812  BP: (!) 148/89  Pulse: 78  Resp: 18  Temp: 97.6 F (36.4 C)  SpO2: 99%   Filed Weights   12/09/17 0812  Weight: 158 lb 4.8 oz (71.8 kg)   GENERAL:alert, no distress and comfortable SKIN: skin color, texture, turgor are normal, no rashes or significant lesions EYES: normal, Conjunctiva are pink and non-injected, sclera clear OROPHARYNX:no exudate, no erythema and lips, buccal mucosa, and tongue normal  NECK: supple, thyroid normal size, non-tender, without nodularity LYMPH:  no palpable lymphadenopathy in the cervical, axillary or inguinal LUNGS: clear to auscultation and percussion with normal breathing effort HEART: regular rate & rhythm and no murmurs and no  lower extremity edema ABDOMEN:abdomen soft, non-tender and normal bowel sounds MUSCULOSKELETAL:no cyanosis of digits and no clubbing  NEURO: alert & oriented x 3 with fluent speech, no focal motor/sensory deficits EXTREMITIES: No lower extremity edema BREAST: No palpable lumps or nodules in bilateral chest wall or axilla. (exam performed in the presence of a chaperone)  LABORATORY DATA:  I have reviewed the data as listed   Chemistry      Component Value Date/Time   NA 140 11/28/2014 0813   K 4.9 11/28/2014 0813   CL 101 09/21/2013 0842   CL 104 06/10/2013 1312   CO2 27 11/28/2014 0813   BUN 26.1 (H) 11/28/2014 0813   CREATININE 0.7 11/28/2014 0813      Component Value Date/Time   CALCIUM 9.6 11/28/2014 0813   ALKPHOS 50 11/28/2014 0813   AST 33 11/28/2014 0813   ALT 32 11/28/2014 0813   BILITOT 0.41 11/28/2014 0813       Lab Results  Component Value Date   WBC 6.0 11/28/2014   HGB 14.6 11/28/2014   HCT 44.3 11/28/2014   MCV 85.5 11/28/2014   PLT 211 11/28/2014   NEUTROABS  3.5 11/28/2014    ASSESSMENT & PLAN:  Breast cancer of upper-outer quadrant of left female breast ([place]) Left breast invasive ductal carcinoma ER 100% PR 86% HER-2 positive ratio 6.81, Ki-67 46 % status post bilateral mastectomies followed by reconstruction status post 6 cycles of TCH followed by Herceptin maintenance completed 02/16/2014, try different antiestrogen therapies including Aromasin, letrozole, tamoxifen but could not tolerate any of them because of severe depression symptoms.   Surveillance: 1. Breast exam 12/09/17 normal axilla and supraclavicular areas, bilateral breast implants normal patient got nipple tattoos. Palpable nodularity related to oil cysts in the superior medial aspect of bilateral breasts 2. No role of imaging (MRI breast July 2016 was normal accompanied by CT chest abdomen and pelvis)  Survivorship: Patient exercises twice a day and stays very active. I encouraged her to continue to do her physical activities and exercises.  Return to clinic annually for physical exams and follow-up with Mendel Ryder in survivorship   I spent 15 minutes talking to the patient of which more than half was spent in counseling and coordination of care.  No orders of the defined types were placed in this encounter.  The patient has a good understanding of the overall plan. she agrees with it. she will call with any problems that may develop before the next visit here.   Scot Dock, NP 12/09/17

## 2017-12-09 NOTE — Telephone Encounter (Signed)
Scheduled appt per 12/18 los - patient did not want avs or calendar.

## 2017-12-11 ENCOUNTER — Encounter (HOSPITAL_BASED_OUTPATIENT_CLINIC_OR_DEPARTMENT_OTHER): Payer: Self-pay | Admitting: *Deleted

## 2017-12-11 ENCOUNTER — Other Ambulatory Visit: Payer: Self-pay

## 2017-12-11 ENCOUNTER — Ambulatory Visit (HOSPITAL_BASED_OUTPATIENT_CLINIC_OR_DEPARTMENT_OTHER)
Admission: RE | Admit: 2017-12-11 | Discharge: 2017-12-11 | Disposition: A | Discharge status: Home / Self Care | Payer: 59 | Source: Ambulatory Visit | Attending: Plastic Surgery | Admitting: Plastic Surgery

## 2017-12-11 ENCOUNTER — Encounter (HOSPITAL_BASED_OUTPATIENT_CLINIC_OR_DEPARTMENT_OTHER)
Admission: RE | Disposition: A | Discharge status: Home / Self Care | Payer: Self-pay | Source: Ambulatory Visit | Attending: Plastic Surgery

## 2017-12-11 ENCOUNTER — Ambulatory Visit (HOSPITAL_BASED_OUTPATIENT_CLINIC_OR_DEPARTMENT_OTHER): Payer: 59 | Admitting: Anesthesiology

## 2017-12-11 DIAGNOSIS — K589 Irritable bowel syndrome without diarrhea: Secondary | ICD-10-CM | POA: Diagnosis not present

## 2017-12-11 DIAGNOSIS — Z9013 Acquired absence of bilateral breasts and nipples: Secondary | ICD-10-CM

## 2017-12-11 DIAGNOSIS — N651 Disproportion of reconstructed breast: Secondary | ICD-10-CM | POA: Diagnosis not present

## 2017-12-11 DIAGNOSIS — F419 Anxiety disorder, unspecified: Secondary | ICD-10-CM | POA: Insufficient documentation

## 2017-12-11 DIAGNOSIS — Z882 Allergy status to sulfonamides status: Secondary | ICD-10-CM | POA: Diagnosis not present

## 2017-12-11 DIAGNOSIS — F909 Attention-deficit hyperactivity disorder, unspecified type: Secondary | ICD-10-CM | POA: Diagnosis not present

## 2017-12-11 DIAGNOSIS — N6489 Other specified disorders of breast: Secondary | ICD-10-CM | POA: Diagnosis not present

## 2017-12-11 DIAGNOSIS — J45909 Unspecified asthma, uncomplicated: Secondary | ICD-10-CM | POA: Diagnosis not present

## 2017-12-11 DIAGNOSIS — Z853 Personal history of malignant neoplasm of breast: Secondary | ICD-10-CM | POA: Diagnosis not present

## 2017-12-11 DIAGNOSIS — Z880 Allergy status to penicillin: Secondary | ICD-10-CM | POA: Diagnosis not present

## 2017-12-11 DIAGNOSIS — E039 Hypothyroidism, unspecified: Secondary | ICD-10-CM | POA: Insufficient documentation

## 2017-12-11 DIAGNOSIS — Z79899 Other long term (current) drug therapy: Secondary | ICD-10-CM | POA: Diagnosis not present

## 2017-12-11 DIAGNOSIS — L905 Scar conditions and fibrosis of skin: Secondary | ICD-10-CM | POA: Diagnosis not present

## 2017-12-11 HISTORY — PX: BREAST IMPLANT EXCHANGE: SHX6296

## 2017-12-11 HISTORY — DX: Anxiety disorder, unspecified: F41.9

## 2017-12-11 HISTORY — DX: Attention-deficit hyperactivity disorder, unspecified type: F90.9

## 2017-12-11 SURGERY — REPLACEMENT, IMPLANT, BREAST
Anesthesia: General | Site: Breast | Laterality: Bilateral

## 2017-12-11 MED ORDER — LIDOCAINE-EPINEPHRINE 1 %-1:100000 IJ SOLN
INTRAMUSCULAR | Status: AC
  Filled 2017-12-11: qty 1

## 2017-12-11 MED ORDER — SODIUM CHLORIDE 0.9 % IV SOLN
INTRAVENOUS | Status: DC | PRN
  Administered 2017-12-11: 500 mL

## 2017-12-11 MED ORDER — ACETAMINOPHEN 325 MG PO TABS: 650.0000 mg | ORAL_TABLET | ORAL | Status: DC | PRN

## 2017-12-11 MED ORDER — HYDROCODONE-ACETAMINOPHEN 5-325 MG PO TABS: 1.0000 | ORAL_TABLET | Freq: Four times a day (QID) | ORAL | 0 refills | Status: DC | PRN

## 2017-12-11 MED ORDER — SUCCINYLCHOLINE CHLORIDE 200 MG/10ML IV SOSY
PREFILLED_SYRINGE | INTRAVENOUS | Status: AC
  Filled 2017-12-11: qty 10

## 2017-12-11 MED ORDER — HYDROMORPHONE HCL 1 MG/ML IJ SOLN
INTRAMUSCULAR | Status: AC
  Filled 2017-12-11: qty 0.5

## 2017-12-11 MED ORDER — BUPIVACAINE-EPINEPHRINE (PF) 0.25% -1:200000 IJ SOLN
INTRAMUSCULAR | Status: AC
  Filled 2017-12-11: qty 30

## 2017-12-11 MED ORDER — ONDANSETRON HCL 4 MG PO TABS: 4.0000 mg | ORAL_TABLET | Freq: Every day | ORAL | 1 refills | Status: DC | PRN

## 2017-12-11 MED ORDER — EPHEDRINE SULFATE 50 MG/ML IJ SOLN
INTRAMUSCULAR | Status: DC | PRN
  Administered 2017-12-11 (×2): 15 mg via INTRAVENOUS

## 2017-12-11 MED ORDER — SODIUM CHLORIDE 0.9% FLUSH: 3.0000 mL | Freq: Two times a day (BID) | INTRAVENOUS | Status: DC

## 2017-12-11 MED ORDER — CIPROFLOXACIN IN D5W 400 MG/200ML IV SOLN
400.0000 mg | INTRAVENOUS | Status: AC
  Administered 2017-12-11: 400 mg via INTRAVENOUS

## 2017-12-11 MED ORDER — HYDROMORPHONE HCL 1 MG/ML IJ SOLN
0.2500 mg | INTRAMUSCULAR | Status: DC | PRN
  Administered 2017-12-11: 0.5 mg via INTRAVENOUS

## 2017-12-11 MED ORDER — BACITRACIN ZINC 500 UNIT/GM EX OINT
TOPICAL_OINTMENT | CUTANEOUS | Status: AC
  Filled 2017-12-11: qty 0.9

## 2017-12-11 MED ORDER — ACETAMINOPHEN 650 MG RE SUPP: 650.0000 mg | RECTAL | Status: DC | PRN

## 2017-12-11 MED ORDER — EPHEDRINE 5 MG/ML INJ
INTRAVENOUS | Status: AC
  Filled 2017-12-11: qty 10

## 2017-12-11 MED ORDER — PHENYLEPHRINE HCL 10 MG/ML IJ SOLN
INTRAMUSCULAR | Status: AC
  Filled 2017-12-11: qty 1

## 2017-12-11 MED ORDER — SODIUM CHLORIDE 0.9% FLUSH: 3.0000 mL | INTRAVENOUS | Status: DC | PRN

## 2017-12-11 MED ORDER — OXYCODONE HCL 5 MG/5ML PO SOLN: 5.0000 mg | Freq: Once | ORAL | Status: DC | PRN

## 2017-12-11 MED ORDER — OXYCODONE HCL 5 MG PO TABS: 5.0000 mg | ORAL_TABLET | ORAL | Status: DC | PRN

## 2017-12-11 MED ORDER — SODIUM CHLORIDE 0.9 % IJ SOLN
INTRAMUSCULAR | Status: AC
  Filled 2017-12-11: qty 10

## 2017-12-11 MED ORDER — LIDOCAINE HCL (CARDIAC) 20 MG/ML IV SOLN
INTRAVENOUS | Status: DC | PRN
  Administered 2017-12-11: 50 mg via INTRAVENOUS

## 2017-12-11 MED ORDER — EPINEPHRINE 30 MG/30ML IJ SOLN
INTRAMUSCULAR | Status: AC
  Filled 2017-12-11: qty 1

## 2017-12-11 MED ORDER — PROMETHAZINE HCL 25 MG/ML IJ SOLN: 6.2500 mg | INTRAMUSCULAR | Status: DC | PRN

## 2017-12-11 MED ORDER — DIAZEPAM 2 MG PO TABS: 2.0000 mg | ORAL_TABLET | Freq: Four times a day (QID) | ORAL | 0 refills | Status: DC | PRN

## 2017-12-11 MED ORDER — LIDOCAINE-EPINEPHRINE 1 %-1:100000 IJ SOLN
INTRAMUSCULAR | Status: DC | PRN
  Administered 2017-12-11: 5 mL

## 2017-12-11 MED ORDER — PROPOFOL 500 MG/50ML IV EMUL
INTRAVENOUS | Status: AC
  Filled 2017-12-11: qty 50

## 2017-12-11 MED ORDER — PHENYLEPHRINE 40 MCG/ML (10ML) SYRINGE FOR IV PUSH (FOR BLOOD PRESSURE SUPPORT)
PREFILLED_SYRINGE | INTRAVENOUS | Status: DC | PRN
  Administered 2017-12-11 (×2): 80 ug via INTRAVENOUS

## 2017-12-11 MED ORDER — SCOPOLAMINE 1 MG/3DAYS TD PT72: 1.0000 | MEDICATED_PATCH | Freq: Once | TRANSDERMAL | Status: DC | PRN

## 2017-12-11 MED ORDER — ONDANSETRON HCL 4 MG/2ML IJ SOLN
INTRAMUSCULAR | Status: AC
  Filled 2017-12-11: qty 2

## 2017-12-11 MED ORDER — PHENYLEPHRINE 40 MCG/ML (10ML) SYRINGE FOR IV PUSH (FOR BLOOD PRESSURE SUPPORT)
PREFILLED_SYRINGE | INTRAVENOUS | Status: AC
  Filled 2017-12-11: qty 10

## 2017-12-11 MED ORDER — PHENYLEPHRINE HCL 10 MG/ML IJ SOLN
INTRAMUSCULAR | Status: DC | PRN
  Administered 2017-12-11: 40 ug/min via INTRAVENOUS

## 2017-12-11 MED ORDER — MIDAZOLAM HCL 2 MG/2ML IJ SOLN
INTRAMUSCULAR | Status: AC
  Filled 2017-12-11: qty 2

## 2017-12-11 MED ORDER — ATROPINE SULFATE 0.4 MG/ML IJ SOLN
INTRAMUSCULAR | Status: AC
  Filled 2017-12-11: qty 1

## 2017-12-11 MED ORDER — SODIUM CHLORIDE 0.9 % IV SOLN: 250.0000 mL | INTRAVENOUS | Status: DC | PRN

## 2017-12-11 MED ORDER — LACTATED RINGERS IV SOLN
INTRAVENOUS | Status: DC
  Administered 2017-12-11 (×2): via INTRAVENOUS

## 2017-12-11 MED ORDER — BUPIVACAINE-EPINEPHRINE (PF) 0.5% -1:200000 IJ SOLN
INTRAMUSCULAR | Status: AC
  Filled 2017-12-11: qty 60

## 2017-12-11 MED ORDER — ONDANSETRON HCL 4 MG/2ML IJ SOLN
INTRAMUSCULAR | Status: DC | PRN
  Administered 2017-12-11: 4 mg via INTRAVENOUS

## 2017-12-11 MED ORDER — MIDAZOLAM HCL 2 MG/2ML IJ SOLN
1.0000 mg | INTRAMUSCULAR | Status: DC | PRN
  Administered 2017-12-11: 2 mg via INTRAVENOUS

## 2017-12-11 MED ORDER — PROPOFOL 10 MG/ML IV BOLUS
INTRAVENOUS | Status: DC | PRN
  Administered 2017-12-11: 200 mg via INTRAVENOUS

## 2017-12-11 MED ORDER — CIPROFLOXACIN IN D5W 400 MG/200ML IV SOLN
INTRAVENOUS | Status: AC
  Filled 2017-12-11: qty 200

## 2017-12-11 MED ORDER — FENTANYL CITRATE (PF) 100 MCG/2ML IJ SOLN
INTRAMUSCULAR | Status: AC
  Filled 2017-12-11: qty 2

## 2017-12-11 MED ORDER — LIDOCAINE 2% (20 MG/ML) 5 ML SYRINGE
INTRAMUSCULAR | Status: AC
  Filled 2017-12-11: qty 5

## 2017-12-11 MED ORDER — OXYCODONE HCL 5 MG PO TABS: 5.0000 mg | ORAL_TABLET | Freq: Once | ORAL | Status: DC | PRN

## 2017-12-11 MED ORDER — DEXAMETHASONE SODIUM PHOSPHATE 10 MG/ML IJ SOLN
INTRAMUSCULAR | Status: AC
  Filled 2017-12-11: qty 1

## 2017-12-11 MED ORDER — DEXAMETHASONE SODIUM PHOSPHATE 4 MG/ML IJ SOLN
INTRAMUSCULAR | Status: DC | PRN
  Administered 2017-12-11: 10 mg via INTRAVENOUS

## 2017-12-11 MED ORDER — CIPROFLOXACIN HCL 500 MG PO TABS: 500.0000 mg | ORAL_TABLET | Freq: Two times a day (BID) | ORAL | 0 refills | Status: AC

## 2017-12-11 MED ORDER — FENTANYL CITRATE (PF) 100 MCG/2ML IJ SOLN
50.0000 ug | INTRAMUSCULAR | Status: DC | PRN
  Administered 2017-12-11: 100 ug via INTRAVENOUS

## 2017-12-11 SURGICAL SUPPLY — 77 items
BAG DECANTER FOR FLEXI CONT (MISCELLANEOUS) ×3 IMPLANT
BINDER BREAST LRG (GAUZE/BANDAGES/DRESSINGS) IMPLANT
BINDER BREAST MEDIUM (GAUZE/BANDAGES/DRESSINGS) IMPLANT
BINDER BREAST XLRG (GAUZE/BANDAGES/DRESSINGS) IMPLANT
BINDER BREAST XXLRG (GAUZE/BANDAGES/DRESSINGS) IMPLANT
BIOPATCH RED 1 DISK 7.0 (GAUZE/BANDAGES/DRESSINGS) IMPLANT
BLADE HEX COATED 2.75 (ELECTRODE) ×3 IMPLANT
BLADE KNIFE PERSONA 10 (BLADE) ×6 IMPLANT
BLADE SURG 15 STRL LF DISP TIS (BLADE) ×2 IMPLANT
BLADE SURG 15 STRL SS (BLADE) ×1
BNDG GAUZE ELAST 4 BULKY (GAUZE/BANDAGES/DRESSINGS) ×6 IMPLANT
CANISTER SUCT 1200ML W/VALVE (MISCELLANEOUS) ×3 IMPLANT
CHLORAPREP W/TINT 26ML (MISCELLANEOUS) ×3 IMPLANT
COVER BACK TABLE 60X90IN (DRAPES) ×3 IMPLANT
COVER MAYO STAND STRL (DRAPES) ×3 IMPLANT
DECANTER SPIKE VIAL GLASS SM (MISCELLANEOUS) IMPLANT
DERMABOND ADVANCED (GAUZE/BANDAGES/DRESSINGS) ×2
DERMABOND ADVANCED .7 DNX12 (GAUZE/BANDAGES/DRESSINGS) ×4 IMPLANT
DRAIN CHANNEL 19F RND (DRAIN) IMPLANT
DRAPE LAPAROSCOPIC ABDOMINAL (DRAPES) ×3 IMPLANT
DRSG PAD ABDOMINAL 8X10 ST (GAUZE/BANDAGES/DRESSINGS) ×6 IMPLANT
DRSG TEGADERM 2-3/8X2-3/4 SM (GAUZE/BANDAGES/DRESSINGS) IMPLANT
ELECT BLADE 4.0 EZ CLEAN MEGAD (MISCELLANEOUS) ×3
ELECT REM PT RETURN 9FT ADLT (ELECTROSURGICAL) ×3
ELECTRODE BLDE 4.0 EZ CLN MEGD (MISCELLANEOUS) ×2 IMPLANT
ELECTRODE REM PT RTRN 9FT ADLT (ELECTROSURGICAL) ×2 IMPLANT
EVACUATOR SILICONE 100CC (DRAIN) IMPLANT
GAUZE SPONGE 4X4 12PLY STRL LF (GAUZE/BANDAGES/DRESSINGS) IMPLANT
GLOVE BIO SURGEON STRL SZ 6.5 (GLOVE) ×6 IMPLANT
GOWN STRL REUS W/ TWL LRG LVL3 (GOWN DISPOSABLE) ×4 IMPLANT
GOWN STRL REUS W/TWL LRG LVL3 (GOWN DISPOSABLE) ×2
IMPL BREAST HP GEL 400CC (Breast) ×2 IMPLANT
IMPL GEL HI PROFILE 350CC (Breast) ×2 IMPLANT
IMPLANT BREAST HP GEL 400CC (Breast) ×3 IMPLANT
IMPLANT GEL HI PROFILE 350CC (Breast) ×3 IMPLANT
IV NS 1000ML (IV SOLUTION)
IV NS 1000ML BAXH (IV SOLUTION) IMPLANT
IV NS 500ML (IV SOLUTION) ×1
IV NS 500ML BAXH (IV SOLUTION) ×2 IMPLANT
KIT FILL SYSTEM UNIVERSAL (SET/KITS/TRAYS/PACK) IMPLANT
NDL SAFETY ECLIPSE 18X1.5 (NEEDLE) ×2 IMPLANT
NEEDLE HYPO 18GX1.5 SHARP (NEEDLE) ×1
NEEDLE HYPO 25X1 1.5 SAFETY (NEEDLE) ×3 IMPLANT
NS IRRIG 1000ML POUR BTL (IV SOLUTION) ×3 IMPLANT
PACK BASIN DAY SURGERY FS (CUSTOM PROCEDURE TRAY) ×3 IMPLANT
PAD ALCOHOL SWAB (MISCELLANEOUS) IMPLANT
PENCIL BUTTON HOLSTER BLD 10FT (ELECTRODE) ×3 IMPLANT
PIN SAFETY STERILE (MISCELLANEOUS) IMPLANT
SIZER BREAST REUSE 400CC (SIZER) ×1
SIZER BREAST REUSE STRL 350CC (SIZER) ×3
SIZER BRST REUSE 12.2 400CC (SIZER) ×2 IMPLANT
SIZER BRST REUSE STRL 350CC (SIZER) ×2 IMPLANT
SLEEVE SCD COMPRESS KNEE MED (MISCELLANEOUS) ×3 IMPLANT
SPONGE LAP 18X18 X RAY DECT (DISPOSABLE) ×6 IMPLANT
STRIP SUTURE WOUND CLOSURE 1/2 (SUTURE) ×6 IMPLANT
SUT MNCRL AB 4-0 PS2 18 (SUTURE) ×6 IMPLANT
SUT MON AB 3-0 SH 27 (SUTURE) ×1
SUT MON AB 3-0 SH27 (SUTURE) ×2 IMPLANT
SUT MON AB 5-0 PS2 18 (SUTURE) ×6 IMPLANT
SUT PDS 3-0 CT2 (SUTURE)
SUT PDS AB 2-0 CT2 27 (SUTURE) IMPLANT
SUT PDS II 3-0 CT2 27 ABS (SUTURE) IMPLANT
SUT SILK 3 0 PS 1 (SUTURE) IMPLANT
SUT VIC AB 3-0 SH 27 (SUTURE)
SUT VIC AB 3-0 SH 27X BRD (SUTURE) IMPLANT
SUT VICRYL 4-0 PS2 18IN ABS (SUTURE) ×6 IMPLANT
SYR 3ML 23GX1 SAFETY (SYRINGE) ×3 IMPLANT
SYR 50ML LL SCALE MARK (SYRINGE) IMPLANT
SYR BULB IRRIGATION 50ML (SYRINGE) ×3 IMPLANT
SYR CONTROL 10ML LL (SYRINGE) ×3 IMPLANT
TAPE MEASURE VINYL STERILE (MISCELLANEOUS) ×3 IMPLANT
TOWEL OR 17X24 6PK STRL BLUE (TOWEL DISPOSABLE) ×6 IMPLANT
TUBE CONNECTING 20X1/4 (TUBING) ×3 IMPLANT
TUBING INFILTRATION IT-10001 (TUBING) IMPLANT
TUBING SET GRADUATE ASPIR 12FT (MISCELLANEOUS) IMPLANT
UNDERPAD 30X30 (UNDERPADS AND DIAPERS) ×6 IMPLANT
YANKAUER SUCT BULB TIP NO VENT (SUCTIONS) ×3 IMPLANT

## 2017-12-11 NOTE — Discharge Instructions (Signed)
May shower tomorrow. °No heavy lifting. °Continue binder or sports bra. °

## 2017-12-11 NOTE — H&P (Signed)
Jane Gutierrez is an 61 y.o. female.   Chief Complaint: acquired absence of breasts HPI: The patient is a 61 y.o. yrs old wf here for breast reconstruction after LEFT breast cancer.  She several challenges and complication with the reconstruction due to the chemotherapy.  She is now doing well with the latissimus muscle flap and implant in place on the left.  She has noticed irritation and a bulge under the left axilla area, lateral to the left breast.  This is irritating and sometimes tender when she works out and exercises.  There is mild asymmetry with excess of the soft tissue in the left axilla.  No sign of fluid retention or infection.  She is also greatly concerned with the textured implants and slight asymmetry. Left - Mentor Memory Shape Breast Implant 345cc.  Right - Mentor Memory Shape Breast Implant 345cc 395cc.  Past Medical History:  Diagnosis Date  . Abnormally small mouth   . ADD (attention deficit disorder)   . ADHD   . Allergy   . Anxiety   . Asthma    prn inhaler  . Blood transfusion without reported diagnosis   . Breast cancer ([place])   . Dental crowns present   . Difficulty swallowing pills   . History of breast cancer   . History of cervical cancer   . History of palpitations    last echo 08/22/2014  . Hypothyroidism   . IBS (irritable bowel syndrome)     Past Surgical History:  Procedure Laterality Date  . ABDOMINAL HYSTERECTOMY    . ABDOMINOPLASTY  03/28/2004  . AXILLARY SENTINEL NODE BIOPSY  12/31/2012   Procedure: AXILLARY SENTINEL NODE BIOPSY;  Surgeon: Rolm Bookbinder, MD;  Location: [place];  Service: General;  Laterality: Bilateral;  bilateral sentinel node  . BRAIN SURGERY     removal benign frontal lobe cyst in 1987  . BREAST ENHANCEMENT SURGERY Bilateral ~ 2008  . BREAST RECONSTRUCTION WITH PLACEMENT OF TISSUE EXPANDER AND FLEX HD (ACELLULAR HYDRATED DERMIS)  12/31/2012   Procedure: BREAST RECONSTRUCTION WITH PLACEMENT OF TISSUE EXPANDER  AND FLEX HD (ACELLULAR HYDRATED DERMIS);  Surgeon: Theodoro Kos, DO;  Location: [facility];  Service: Plastics;  Laterality: Bilateral;  bilateral immediate breast reconstruction with expanders and flex hd   . CAPSULOTOMY Left 09/01/2014   Procedure: CAPSULOTOMY OF LEFT BREAST WITH LIPOFILLING FOR SYMMETRY;  Surgeon: Theodoro Kos, DO;  Location: [place];  Service: Plastics;  Laterality: Left;  . CERVICAL CERCLAGE    . DIAGNOSTIC LAPAROSCOPY  05/12/2003   right partial salpingectomy; lysis paraovarian adhesions left  . DILATION AND CURETTAGE OF UTERUS  2005  . ENDOMETRIAL FULGURATION  05/12/2003  . FACIAL COSMETIC SURGERY  ~ 2010  . FOOT SURGERY Bilateral 07/2012  . GYNECOLOGIC CRYOSURGERY  1998  . INCISION AND DRAINAGE OF WOUND Left 04/28/2013   Procedure: IRRIGATION AND DEBRIDEMENT LEFT BREAST WOUND, POSSIBLE CLOSURE OF WOUND WITH DRAIN PLACEMENT;  Surgeon: Theodoro Kos, DO;  Location: [place];  Service: Plastics;  Laterality: Left;  . LATISSIMUS FLAP TO BREAST Left 09/29/2013   Procedure: LATISSIMUS FLAP TO BREAST WITH TISSUE EXPANDER PLACEMENT FOR LEFT BREAST RECONSTRUCTION;  Surgeon: Theodoro Kos, DO;  Location: [place];  Service: Plastics;  Laterality: Left;  . LIPECTOMY Bilateral 03/28/2004   hip  . LIPOSUCTION Bilateral 03/09/2014   Procedure: LIPOSUCTION ABDOMEN W/ LIPOFILLING LATERAL BILATERAL BREAST;  Surgeon: Theodoro Kos, DO;  Location: [place];  Service: Plastics;  Laterality: Bilateral;  LIPOSUCTION NECK  . LIPOSUCTION WITH LIPOFILLING Bilateral 09/01/2014   Procedure: LIPOSUCTION WITH LIPOFILLING;  Surgeon: Theodoro Kos, DO;  Location: [facility] [place];  Service: Plastics;  Laterality: Bilateral;  . LYSIS OF ADHESION  03/28/2004   pelvic  . MASTECTOMY    . PORT-A-CATH REMOVAL Right 03/09/2014   Procedure: REMOVAL PORT-A-CATH;  Surgeon: Theodoro Kos, DO;  Location: [place];  Service:  Plastics;  Laterality: Right;  . PORTACATH PLACEMENT  01/27/2013   Procedure: INSERTION PORT-A-CATH;  Surgeon: Rolm Bookbinder, MD;  Location: [place];  Service: General;  Laterality: Right;  . RADIOFREQUENCY ABLATION NERVES  05/12/2003   uterosacral nerve ablation  . REMOVAL OF BILATERAL TISSUE EXPANDERS WITH PLACEMENT OF BILATERAL BREAST IMPLANTS Bilateral 03/09/2014   Procedure: REMOVAL OF BILATERAL TISSUE EXPANDERS WITH PLACEMENT OF BILATERAL BREAST IMPLANTS;  Surgeon: Theodoro Kos, DO;  Location: [place];  Service: Plastics;  Laterality: Bilateral;  . TEE WITHOUT CARDIOVERSION N/A 06/04/2013   Procedure: TRANSESOPHAGEAL ECHOCARDIOGRAM (TEE);  Surgeon: Jolaine Artist, MD;  Location: [facility] ENDOSCOPY;  Service: Cardiovascular;  Laterality: N/A;  . TISSUE EXPANDER PLACEMENT Left 06/16/2013   Procedure: LEFT BREAST TISSUE EXPANDER REMOVAL;  Surgeon: Theodoro Kos, DO;  Location: [place];  Service: Plastics;  Laterality: Left;  . TISSUE EXPANDER PLACEMENT Left 09/29/2013   Procedure: TISSUE EXPANDER;  Surgeon: Theodoro Kos, DO;  Location: [place];  Service: Plastics;  Laterality: Left;  . TOTAL ABDOMINAL HYSTERECTOMY W/ BILATERAL SALPINGOOPHORECTOMY  03/28/2004  . TOTAL MASTECTOMY  12/31/2012   Procedure: TOTAL MASTECTOMY;  Surgeon: Rolm Bookbinder, MD;  Location: [place];  Service: General;  Laterality: Bilateral;  bilateral total mastectomies    Family History  Problem Relation Age of Onset  . Cancer Mother 59       uterine, mult myeloma  . CVA Father   . Cancer Sister 15       breast  . Cancer Maternal Grandmother 76       breast  . Lupus Sister        twin sister  . Sarcoidosis Sister        twin sister  . Rheum arthritis Sister        twin sister  . Celiac disease Daughter    Social History:  reports that  has never smoked. she has never used smokeless tobacco. She reports that she does not drink alcohol or use  drugs.  Allergies:  Allergies  Allergen Reactions  . Betadine [Povidone Iodine] Anaphylaxis  . Iodine Anaphylaxis  . Shellfish-Derived Products Anaphylaxis  . Cleocin [Clindamycin Hcl] Rash  . Ginger Rash  . Penicillins Rash  . Sulfamethoxazole-Trimethoprim Rash    Medications Prior to Admission  Medication Sig Dispense Refill  . albuterol (PROVENTIL) (2.5 MG/3ML) 0.083% nebulizer solution Take 2.5 mg by nebulization every 6 (six) hours as needed for wheezing or shortness of breath.    . B Complex-C (B-COMPLEX WITH VITAMIN C) tablet Take 1 tablet by mouth daily.    . Biotin 1 MG CAPS Take 1 capsule by mouth daily.    [name] dextroamphetamine (DEXTROSTAT) 5 MG tablet Take 5 mg by mouth 4 (four) times daily as needed.     . Eszopiclone 3 MG TABS TAKE 1 TABLET BY MOUTH IMMEDIATELY BEFORE BEDTIME 30 tablet 5  . hyoscyamine (ANASPAZ) 0.125 MG TBDP disintergrating tablet Place 0.125 mg under the tongue as needed.     . Multiple Vitamin (MULTIVITAMIN WITH  MINERALS) TABS Take 1 tablet by mouth daily.    [name] SYNTHROID 100 MCG tablet TAKE 1 TABLET BY MOUTH DAILY. 30 tablet 4  . valACYclovir (VALTREX) 500 MG tablet Take 250 mg by mouth daily.    . vitamin E 600 UNIT capsule Take by mouth.    . EPINEPHrine 0.3 mg/0.3 mL IJ SOAJ injection Inject 0.3 mLs (0.3 mg total) into the muscle once. (Patient taking differently: Inject 0.3 mg into the muscle once. As needed) 1 Device 5    No results found for this or any previous visit (from the past 48 hour(s)). No results found.  Review of Systems  Constitutional: Negative.   HENT: Negative.   Eyes: Negative.   Respiratory: Negative.   Cardiovascular: Negative.   Gastrointestinal: Negative.   Genitourinary: Negative.   Musculoskeletal: Negative.   Skin: Negative.   Neurological: Negative.   Psychiatric/Behavioral: Negative.     Blood pressure 130/78, pulse 72, temperature 98.6 F (37 C), temperature source Oral, resp. rate 18, height 5\' 5"  (1.651  m), weight 71.2 kg (157 lb), SpO2 99 %. Physical Exam  Constitutional: She is oriented to person, place, and time. She appears well-developed and well-nourished.  HENT:  Head: Normocephalic and atraumatic.  Eyes: EOM are normal. Pupils are equal, round, and reactive to light.  Cardiovascular: Normal rate.  Respiratory: Effort normal. No respiratory distress.  GI: Soft. She exhibits no distension.  Neurological: She is alert and oriented to person, place, and time.  Skin: Skin is warm. No rash noted. No erythema. No pallor.  Psychiatric: She has a normal mood and affect. Her behavior is normal. Judgment and thought content normal.     Assessment/Plan Removal and replacement of bilateral breast implants and liposuction for symmetry.  Norris, DO 12/11/2017, 9:04 AM

## 2017-12-11 NOTE — Anesthesia Procedure Notes (Signed)
Procedure Name: LMA Insertion Date/Time: 12/11/2017 9:40 AM Performed by: Willa Frater, CRNA Pre-anesthesia Checklist: Patient identified, Emergency Drugs available, Suction available and Patient being monitored Patient Re-evaluated:Patient Re-evaluated prior to induction Oxygen Delivery Method: Circle system utilized Preoxygenation: Pre-oxygenation with 100% oxygen Induction Type: IV induction Ventilation: Mask ventilation without difficulty LMA: LMA inserted LMA Size: 4.0 Number of attempts: 1 Airway Equipment and Method: Bite block Placement Confirmation: positive ETCO2 Tube secured with: Tape Dental Injury: Teeth and Oropharynx as per pre-operative assessment

## 2017-12-11 NOTE — Op Note (Signed)
Op report Bilateral Exchange   DATE OF OPERATION: 12/11/2017  LOCATION: [facility]  SURGICAL DIVISION: Plastic Surgery  PREOPERATIVE DIAGNOSES:  1.History of breast cancer.  2. Acquired absence of bilateral breast.  3. Breast Asymmetry  POSTOPERATIVE DIAGNOSES:  1. History of breast cancer.  2. Acquired absence of bilateral breast.  3. Breast Asymmetry  PROCEDURE:  1. Bilateral exchange of textured implants for smooth implants.   SURGEON: Raneen Jaffer [facility] Zareen Jamison, DO  ANESTHESIA:  General.   COMPLICATIONS: None.   IMPLANTS: Left - Mentor Smooth Round High Profile Gel 350cc. Ref #[id number].  Serial Number [id number] Right - Mentor Smooth Round High Profile Gel 400cc. Ref #[id number].  Serial Number [id number]  INDICATIONS FOR PROCEDURE:  The patient, Jane Gutierrez, is a 61 y.o. female born on Feb 26, 1956, is here for treatment after bilateral mastectomies.  She had tissue expanders placed at the time of mastectomies. She now presents for exchange of her expanders for implants.  She requires capsulotomies to better position the implants. MRN: [id number]  CONSENT:  Informed consent was obtained directly from the patient. Risks, benefits and alternatives were fully discussed. Specific risks including but not limited to bleeding, infection, hematoma, seroma, scarring, pain, implant infection, implant extrusion, capsular contracture, asymmetry, wound healing problems, and need for further surgery were all discussed. The patient did have an ample opportunity to have her questions answered to her satisfaction.   DESCRIPTION OF PROCEDURE:  The patient was taken to the operating room. SCDs were placed and IV antibiotics were given. The patient's chest was prepped and draped in a sterile fashion. A time out was performed and the implants to be used were identified.    On the right breast: One percent Lidocaine with epinephrine was used to infiltrate at the incision  site. The old vertical mastectomy scar was incised.  The mastectomy flaps from the lateral and medial flaps were raised for several centimeters to minimize tension for the closure. The capsule was split inferior laterally to to expose and remove the implant.  Inspection of the pocket showed a normal healthy capsule.  The pocket was irrigated with antibiotic solution.  Hemostasis was ensured with electrocautery. New gloves were placed. The implant was soaked in antibiotic solution and then placed in the pocket and oriented appropriately. The pectoralis major muscle and capsule on the anterior surface were re-closed with a 3-0 Monocryl suture. The remaining skin was closed with 4-0 Monocryl deep dermal and 5-0 Monocryl subcuticular stitches.   On the left breast: The old horizontal latissimus scar was excised.  The mastectomy flaps from the superior and inferior flaps were raised over the pectoralis major muscle for several centimeters to minimize tension for the closure. The muscle was split to expose and remove the implant.  Inspection of the pocket showed a normal healthy capsule and good integration of the biologic matrix.   Measurements were made and a sizer utilized to confirm adequate pocket size for the implant dimensions. Hemostasis was ensured with the electrocautery.  New gloves were applied. The implant was soaked in antibiotic solution and placed in the pocket and oriented appropriately. The muscle and capsule on the anterior surface were re-closed with a 3-0 Monocryl suture. There was a mild bulge inferior and lateral which was excised from the flap and improved the contour. The remaining skin was closed with 4-0 Monocryl deep dermal and 5-0 Monocryl subcuticular stitches.  Dermabond was applied to the incision site. A breast binder and ABDs were placed.  The  patient was allowed to wake from anesthesia and taken to the recovery room in satisfactory condition.

## 2017-12-11 NOTE — Anesthesia Preprocedure Evaluation (Addendum)
Anesthesia Evaluation  Patient identified by MRN, date of birth, ID band Patient awake    Reviewed: Allergy & Precautions, H&P , NPO status , Patient's Chart, lab work & pertinent test results  Airway Mallampati: II  TM Distance: >3 FB Neck ROM: Full    Dental no notable dental hx. (+) Teeth Intact, Dental Advisory Given   Pulmonary asthma (mild, controlled) ,    Pulmonary exam normal        Cardiovascular Exercise Tolerance: Good negative cardio ROS Normal cardiovascular exam     Neuro/Psych PSYCHIATRIC DISORDERS Anxiety ADD (attention deficit disorder)   GI/Hepatic Neg liver ROS, IBS (irritable bowel syndrome)   Endo/Other  Hypothyroidism   Renal/GU negative Renal ROS     Musculoskeletal   Abdominal   Peds  Hematology negative hematology ROS (+)   Anesthesia Other Findings   Reproductive/Obstetrics                            Anesthesia Physical  Anesthesia Plan  ASA: II  Anesthesia Plan: General   Post-op Pain Management:    Induction: Intravenous  PONV Risk Score and Plan: 3 and Dexamethasone, Ondansetron, Midazolam and Treatment may vary due to age or medical condition  Airway Management Planned: LMA  Additional Equipment:   Intra-op Plan:   Post-operative Plan: Extubation in OR  Informed Consent: I have reviewed the patients History and Physical, chart, labs and discussed the procedure including the risks, benefits and alternatives for the proposed anesthesia with the patient or authorized representative who has indicated his/her understanding and acceptance.   Dental advisory given  Plan Discussed with: CRNA  Anesthesia Plan Comments:         Anesthesia Quick Evaluation

## 2017-12-11 NOTE — Transfer of Care (Signed)
Immediate Anesthesia Transfer of Care Note  Patient: Jane Gutierrez  Procedure(s) Performed: BILATERAL IMPLANT EXCHANGE (Bilateral Breast)  Patient Location: PACU  Anesthesia Type:General  Level of Consciousness: awake, alert , oriented and drowsy  Airway & Oxygen Therapy: Patient Spontanous Breathing and Patient connected to face mask oxygen  Post-op Assessment: Report given to RN and Post -op Vital signs reviewed and stable  Post vital signs: Reviewed and stable  Last Vitals:  Vitals:   12/11/17 0757 12/11/17 1115  BP: 130/78 (!) 134/98  Pulse: 72 100  Resp: 18   Temp: 37 C   SpO2: 99% 99%    Last Pain:  Vitals:   12/11/17 0757  TempSrc: Oral      Patients Stated Pain Goal: 0 (32/00/37 9444)  Complications: No apparent anesthesia complications

## 2017-12-12 ENCOUNTER — Encounter (HOSPITAL_BASED_OUTPATIENT_CLINIC_OR_DEPARTMENT_OTHER): Payer: Self-pay | Admitting: Plastic Surgery

## 2017-12-12 NOTE — Anesthesia Postprocedure Evaluation (Signed)
Anesthesia Post Note  Patient: Jane Gutierrez  Procedure(s) Performed: BILATERAL IMPLANT EXCHANGE (Bilateral Breast)     Patient location during evaluation: PACU Anesthesia Type: General Level of consciousness: awake and alert Pain management: pain level controlled Vital Signs Assessment: post-procedure vital signs reviewed and stable Respiratory status: spontaneous breathing, nonlabored ventilation, respiratory function stable and patient connected to nasal cannula oxygen Cardiovascular status: blood pressure returned to baseline and stable Postop Assessment: no apparent nausea or vomiting Anesthetic complications: no    Last Vitals:  Vitals:   12/11/17 1200 12/11/17 1230  BP: 122/86 124/83  Pulse: 90 88  Resp: 18 16  Temp:  36.5 C  SpO2: 97% 99%    Last Pain:  Vitals:   12/11/17 0757  TempSrc: Oral                 Leandrew Keech P Sundance Moise

## 2017-12-29 DIAGNOSIS — M2550 Pain in unspecified joint: Secondary | ICD-10-CM | POA: Diagnosis not present

## 2017-12-29 DIAGNOSIS — E663 Overweight: Secondary | ICD-10-CM | POA: Diagnosis not present

## 2017-12-29 DIAGNOSIS — I7300 Raynaud's syndrome without gangrene: Secondary | ICD-10-CM | POA: Diagnosis not present

## 2017-12-29 DIAGNOSIS — Z6826 Body mass index (BMI) 26.0-26.9, adult: Secondary | ICD-10-CM | POA: Diagnosis not present

## 2017-12-29 DIAGNOSIS — R768 Other specified abnormal immunological findings in serum: Secondary | ICD-10-CM | POA: Diagnosis not present

## 2017-12-30 MED FILL — ESZOPICLONE 3 MG TABS: 3 | 30 days supply | Qty: 30 | Fill #4

## 2018-01-05 DIAGNOSIS — Z0000 Encounter for general adult medical examination without abnormal findings: Secondary | ICD-10-CM | POA: Diagnosis not present

## 2018-01-05 DIAGNOSIS — E038 Other specified hypothyroidism: Secondary | ICD-10-CM | POA: Diagnosis not present

## 2018-01-05 MED FILL — SYNTHROID 100 MCG TABLET: 100 | 90 days supply | Qty: 90 | Fill #3

## 2018-01-06 ENCOUNTER — Ambulatory Visit (INDEPENDENT_AMBULATORY_CARE_PROVIDER_SITE_OTHER): Payer: Self-pay | Admitting: Nurse Practitioner

## 2018-01-06 ENCOUNTER — Encounter: Payer: Self-pay | Admitting: Nurse Practitioner

## 2018-01-06 VITALS — BP 124/82 | HR 88 | Temp 98.4°F | Resp 22 | Wt 160.4 lb

## 2018-01-06 DIAGNOSIS — J208 Acute bronchitis due to other specified organisms: Secondary | ICD-10-CM

## 2018-01-06 DIAGNOSIS — J069 Acute upper respiratory infection, unspecified: Secondary | ICD-10-CM

## 2018-01-06 MED ORDER — AZITHROMYCIN 250 MG PO TABS: ORAL_TABLET | ORAL | 0 refills | Status: AC

## 2018-01-06 MED ORDER — PREDNISONE 10 MG (21) PO TBPK: ORAL_TABLET | ORAL | 0 refills | Status: AC

## 2018-01-06 MED ORDER — ALBUTEROL SULFATE (2.5 MG/3ML) 0.083% IN NEBU: 2.5000 mg | INHALATION_SOLUTION | Freq: Four times a day (QID) | RESPIRATORY_TRACT | 1 refills | Status: DC | PRN

## 2018-01-06 MED ORDER — PSEUDOEPH-BROMPHEN-DM 30-2-10 MG/5ML PO SYRP: 5.0000 mL | ORAL_SOLUTION | Freq: Four times a day (QID) | ORAL | 0 refills | Status: AC | PRN

## 2018-01-06 MED FILL — AZITHROMYCIN 250 MG TABS: 250 | 5 days supply | Qty: 6 | Fill #0

## 2018-01-06 MED FILL — BROMIPHENIR-PSEUDOEPHED-DM: 30-2-10 | 7 days supply | Qty: 150 | Fill #0

## 2018-01-06 MED FILL — ALBUTEROL 0.083% INHAL SOLN: (2.5 MG/3ML | 15 days supply | Qty: 180 | Fill #0

## 2018-01-06 MED FILL — predniSONE 10 MG (21) TBPK: 10 | 6 days supply | Qty: 21 | Fill #0

## 2018-01-06 MED FILL — DEXTROAMPHETAMINE 10 MG TAB: 10 | 90 days supply | Qty: 270 | Fill #0

## 2018-01-06 NOTE — Patient Instructions (Addendum)
Acute Bronchitis, Adult Acute bronchitis is sudden (acute) swelling of the air tubes (bronchi) in the lungs. Acute bronchitis causes these tubes to fill with mucus, which can make it hard to breathe. It can also cause coughing or wheezing. In adults, acute bronchitis usually goes away within 2 weeks. A cough caused by bronchitis may last up to 3 weeks. Smoking, allergies, and asthma can make the condition worse. Repeated episodes of bronchitis may cause further lung problems, such as chronic obstructive pulmonary disease (COPD). What are the causes? This condition can be caused by germs and by substances that irritate the lungs, including:  Cold and flu viruses. This condition is most often caused by the same virus that causes a cold.  Bacteria.  Exposure to tobacco smoke, dust, fumes, and air pollution.  What increases the risk? This condition is more likely to develop in people who:  Have close contact with someone with acute bronchitis.  Are exposed to lung irritants, such as tobacco smoke, dust, fumes, and vapors.  Have a weak immune system.  Have a respiratory condition such as asthma.  What are the signs or symptoms? Symptoms of this condition include:  A cough.  Coughing up clear, yellow, or green mucus.  Wheezing.  Chest congestion.  Shortness of breath.  A fever.  Body aches.  Chills.  A sore throat.  How is this diagnosed? This condition is usually diagnosed with a physical exam. During the exam, your health care provider may order tests, such as chest X-rays, to rule out other conditions. He or she may also:  Test a sample of your mucus for bacterial infection.  Check the level of oxygen in your blood. This is done to check for pneumonia.  Do a chest X-ray or lung function testing to rule out pneumonia and other conditions.  Perform blood tests.  Your health care provider will also ask about your symptoms and medical history. How is this  treated? Most cases of acute bronchitis clear up over time without treatment. Your health care provider may recommend:  Drinking more fluids. Drinking more makes your mucus thinner, which may make it easier to breathe.  Taking a medicine for a fever or cough.  Taking an antibiotic medicine.  Using an inhaler to help improve shortness of breath and to control a cough.  Using a cool mist vaporizer or humidifier to make it easier to breathe.  Follow these instructions at home: Medicines  Take over-the-counter and prescription medicines only as told by your health care provider.  If you were prescribed an antibiotic, take it as told by your health care provider. Do not stop taking the antibiotic even if you start to feel better. General instructions  Get plenty of rest.  Drink enough fluids to keep your urine clear or pale yellow.  Avoid smoking and secondhand smoke. Exposure to cigarette smoke or irritating chemicals will make bronchitis worse. If you smoke and you need help quitting, ask your health care provider. Quitting smoking will help your lungs heal faster.  Use an inhaler, cool mist vaporizer, or humidifier as told by your health care provider.  Keep all follow-up visits as told by your health care provider. This is important. How is this prevented? To lower your risk of getting this condition again:  Wash your hands often with soap and water. If soap and water are not available, use hand sanitizer.  Avoid contact with people who have cold symptoms.  Try not to touch your hands to your   mouth, nose, or eyes.  Make sure to get the flu shot every year.  Contact a health care provider if:  Your symptoms do not improve in 2 weeks of treatment. Get help right away if:  You cough up blood.  You have chest pain.  You have severe shortness of breath.  You become dehydrated.  You faint or keep feeling like you are going to faint.  You keep vomiting.  You have a  severe headache.  Your fever or chills gets worse. This information is not intended to replace advice given to you by your health care provider. Make sure you discuss any questions you have with your health care provider. Document Released: 01/16/2005 Document Revised: 07/03/2016 Document Reviewed: 05/29/2016 Elsevier Interactive Patient Education  2018 [employer].  Upper Respiratory Infection, Adult Most upper respiratory infections (URIs) are a viral infection of the air passages leading to the lungs. A URI affects the nose, throat, and upper air passages. The most common type of URI is nasopharyngitis and is typically referred to as "the common cold." URIs run their course and usually go away on their own. Most of the time, a URI does not require medical attention, but sometimes a bacterial infection in the upper airways can follow a viral infection. This is called a secondary infection. Sinus and middle ear infections are common types of secondary upper respiratory infections. Bacterial pneumonia can also complicate a URI. A URI can worsen asthma and chronic obstructive pulmonary disease (COPD). Sometimes, these complications can require emergency medical care and may be life threatening. What are the causes? Almost all URIs are caused by viruses. A virus is a type of germ and can spread from one person to another. What increases the risk? You may be at risk for a URI if:  You smoke.  You have chronic heart or lung disease.  You have a weakened defense (immune) system.  You are very young or very old.  You have nasal allergies or asthma.  You work in crowded or poorly ventilated areas.  You work in health care facilities or schools.  What are the signs or symptoms? Symptoms typically develop 2-3 days after you come in contact with a cold virus. Most viral URIs last 7-10 days. However, viral URIs from the influenza virus (flu virus) can last 14-18 days and are typically more  severe. Symptoms may include:  Runny or stuffy (congested) nose.  Sneezing.  Cough.  Sore throat.  Headache.  Fatigue.  Fever.  Loss of appetite.  Pain in your forehead, behind your eyes, and over your cheekbones (sinus pain).  Muscle aches.  How is this diagnosed? Your health care provider may diagnose a URI by:  Physical exam.  Tests to check that your symptoms are not due to another condition such as: ? Strep throat. ? Sinusitis. ? Pneumonia. ? Asthma.  How is this treated? A URI goes away on its own with time. It cannot be cured with medicines, but medicines may be prescribed or recommended to relieve symptoms. Medicines may help:  Reduce your fever.  Reduce your cough.  Relieve nasal congestion.  Follow these instructions at home:  Take medicines only as directed by your health care provider.  Gargle warm saltwater or take cough drops to comfort your throat as directed by your health care provider.  Use a warm mist humidifier or inhale steam from a shower to increase air moisture. This may make it easier to breathe.  Drink enough fluid to keep  your urine clear or pale yellow.  Eat soups and other clear broths and maintain good nutrition.  Rest as needed.  Return to work when your temperature has returned to normal or as your health care provider advises. You may need to stay home longer to avoid infecting others. You can also use a face mask and careful hand washing to prevent spread of the virus.  Increase the usage of your inhaler if you have asthma.  Do not use any tobacco products, including cigarettes, chewing tobacco, or electronic cigarettes. If you need help quitting, ask your health care provider. How is this prevented? The best way to protect yourself from getting a cold is to practice good hygiene.  Avoid oral or hand contact with people with cold symptoms.  Wash your hands often if contact occurs.  There is no clear evidence that  vitamin C, vitamin E, echinacea, or exercise reduces the chance of developing a cold. However, it is always recommended to get plenty of rest, exercise, and practice good nutrition. Contact a health care provider if:  You are getting worse rather than better.  Your symptoms are not controlled by medicine.  You have chills.  You have worsening shortness of breath.  You have brown or red mucus.  You have yellow or brown nasal discharge.  You have pain in your face, especially when you bend forward.  You have a fever.  You have swollen neck glands.  You have pain while swallowing.  You have white areas in the back of your throat. Get help right away if:  You have severe or persistent: ? Headache. ? Ear pain. ? Sinus pain. ? Chest pain.  You have chronic lung disease and any of the following: ? Wheezing. ? Prolonged cough. ? Coughing up blood. ? A change in your usual mucus.  You have a stiff neck.  You have changes in your: ? Vision. ? Hearing. ? Thinking. ? Mood. This information is not intended to replace advice given to you by your health care provider. Make sure you discuss any questions you have with your health care provider. Document Released: 06/04/2001 Document Revised: 08/11/2016 Document Reviewed: 03/16/2014 Elsevier Interactive Patient Education  [employer].

## 2018-01-06 NOTE — Progress Notes (Signed)
Subjective:  Jane Gutierrez is a 62 y.o. female who presents for evaluation of URI like symptoms.  Symptoms include cough described as productive of green sputum, chest pain during cough and headache.  Onset of symptoms was 5 days ago, and has been gradually worsening since that time.  Treatment to date:  chest percussion, nasal nebulizers, OTC cold medications.  High risk factors for influenza complications:  none.  The following portions of the patient's history were reviewed and updated as appropriate:  allergies, current medications and past medical history.  Constitutional: positive for chills, fatigue and fevers Eyes: negative Ears, nose, mouth, throat, and face: positive for hoarseness, nasal congestion and sore throat, negative for ear drainage and earaches Respiratory: positive for asthma, cough, dyspnea on exertion, wheezing and chest tightness, chest pain with cough, negative for hemoptysis, pneumonia and stridor Cardiovascular: negative Neurological: negative Behavioral/Psych: negative Allergic/Immunologic: negative   Objective:  BP 124/82 (BP Location: Right Arm, Patient Position: Sitting, Cuff Size: Normal)   Pulse 88   Temp 98.4 F (36.9 C) (Oral)   Resp (!) 22   Wt 160 lb 6.4 oz (72.8 kg)   SpO2 98%   BMI 26.69 kg/m  General appearance: alert, cooperative and moderate distress Head: Normocephalic, without obvious abnormality, atraumatic Eyes: conjunctivae/corneas clear. PERRL, EOM's intact. Fundi benign. Ears: normal TM's and external ear canals both ears Nose: Nares normal. Septum midline. Mucosa normal. No drainage or sinus tenderness. Throat: lips, mucosa, and tongue normal; teeth and gums normal Lungs: clear to auscultation bilaterally Heart: regular rate and rhythm, S1, S2 normal, no murmur, click, rub or gallop Pulses: 2+ and symmetric Skin: Skin color, texture, turgor normal. No rashes or lesions Lymph nodes: cervical and submandibular nodes normal Neurologic:  Grossly normal    Assessment:  Acute Bronchitis and Acute Upper Respiratory Infection    Plan:  Discussed diagnosis and treatment of URI. Educational material distributed and questions answered. Suggested symptomatic OTC remedies. Supportive care with appropriate antipyretics and fluids. Zithromax per orders. Nasal steroids per orders. Follow up as needed. Patient also given Albuterol inhaler and albuterol nebulizer.  Patient given Sterapred Dose Pack.

## 2018-01-07 DIAGNOSIS — R05 Cough: Secondary | ICD-10-CM | POA: Diagnosis not present

## 2018-01-07 DIAGNOSIS — J209 Acute bronchitis, unspecified: Secondary | ICD-10-CM | POA: Diagnosis not present

## 2018-01-07 DIAGNOSIS — Z6826 Body mass index (BMI) 26.0-26.9, adult: Secondary | ICD-10-CM | POA: Diagnosis not present

## 2018-01-07 MED FILL — levoFLOXacin 500 MG TABS: 500 | 7 days supply | Qty: 7 | Fill #0

## 2018-01-07 MED FILL — NOREL AD TABLET: 4-10-325 | 15 days supply | Qty: 15 | Fill #0

## 2018-01-07 MED FILL — PROMETHAZINE W/COD SYRUP: 6.25-10 | 7 days supply | Qty: 150 | Fill #0

## 2018-01-08 ENCOUNTER — Telehealth: Payer: Self-pay

## 2018-01-12 DIAGNOSIS — C50412 Malignant neoplasm of upper-outer quadrant of left female breast: Secondary | ICD-10-CM | POA: Diagnosis not present

## 2018-01-12 DIAGNOSIS — Z1389 Encounter for screening for other disorder: Secondary | ICD-10-CM | POA: Diagnosis not present

## 2018-01-12 DIAGNOSIS — Z6826 Body mass index (BMI) 26.0-26.9, adult: Secondary | ICD-10-CM | POA: Diagnosis not present

## 2018-01-12 DIAGNOSIS — D126 Benign neoplasm of colon, unspecified: Secondary | ICD-10-CM | POA: Diagnosis not present

## 2018-01-12 DIAGNOSIS — C539 Malignant neoplasm of cervix uteri, unspecified: Secondary | ICD-10-CM | POA: Diagnosis not present

## 2018-01-12 DIAGNOSIS — E7849 Other hyperlipidemia: Secondary | ICD-10-CM | POA: Diagnosis not present

## 2018-01-12 DIAGNOSIS — E038 Other specified hypothyroidism: Secondary | ICD-10-CM | POA: Diagnosis not present

## 2018-01-22 MED FILL — CIPROFLOXACIN HCL 500 MG TA: 500 | 5 days supply | Qty: 10 | Fill #0

## 2018-01-23 DIAGNOSIS — Z1212 Encounter for screening for malignant neoplasm of rectum: Secondary | ICD-10-CM | POA: Diagnosis not present

## 2018-02-03 ENCOUNTER — Ambulatory Visit: Payer: 59 | Admitting: Rheumatology

## 2018-02-04 NOTE — Progress Notes (Deleted)
Office Visit Note  Patient: Jane Gutierrez             Date of Birth: September 18, 1956           MRN: [id number]             PCP: Reynold Bowen, MD Referring: Reynold Bowen, MD Visit Date: 02/12/2018 Occupation: @GUAROCC @    Subjective:  No chief complaint on file.   History of Present Illness: Jane Gutierrez is a 62 y.o. female ***   Activities of Daily Living:  Patient reports morning stiffness for *** {minute/hour:19697}.   Patient {ACTIONS;DENIES/REPORTS:21021675::"Denies"} nocturnal pain.  Difficulty dressing/grooming: {ACTIONS;DENIES/REPORTS:21021675::"Denies"} Difficulty climbing stairs: {ACTIONS;DENIES/REPORTS:21021675::"Denies"} Difficulty getting out of chair: {ACTIONS;DENIES/REPORTS:21021675::"Denies"} Difficulty using hands for taps, buttons, cutlery, and/or writing: {ACTIONS;DENIES/REPORTS:21021675::"Denies"}   No Rheumatology ROS completed.   PMFS History:  Patient Active Problem List   Diagnosis Date Noted  . Dyspnea 09/11/2015  . Breast cancer of upper-outer quadrant of left female breast ([place]) 06/28/2015  . Right hip pain 09/19/2014  . Right knee pain 09/19/2014  . Dyspnea on exertion 08/08/2014  . Heart palpitations 08/08/2014  . Acquired absence of breast and absent nipple 03/09/2014  . Pre-operative cardiovascular examination 09/09/2013  . Other chronic pulmonary heart diseases 05/27/2013  . SIRS (systemic inflammatory response syndrome) ([place]) 04/14/2013  . Anemia 04/14/2013  . Dehydration 04/14/2013  . Fever 04/14/2013  . Allergy   . IBS (irritable bowel syndrome)   . Thyroid disease   . ADD (attention deficit disorder)   . Lyme disease   . Asthma   . Hypothyroidism   . Contact lens/glasses fitting     Past Medical History:  Diagnosis Date  . Abnormally small mouth   . ADD (attention deficit disorder)   . ADHD   . Allergy   . Anxiety   . Asthma    prn inhaler  . Blood transfusion without reported diagnosis   . Breast cancer ([place])   .  Dental crowns present   . Difficulty swallowing pills   . History of breast cancer   . History of cervical cancer   . History of palpitations    last echo 08/22/2014  . Hypothyroidism   . IBS (irritable bowel syndrome)     Family History  Problem Relation Age of Onset  . Cancer Mother 67       uterine, mult myeloma  . CVA Father   . Cancer Sister 66       breast  . Cancer Maternal Grandmother 64       breast  . Lupus Sister        twin sister  . Sarcoidosis Sister        twin sister  . Rheum arthritis Sister        twin sister  . Celiac disease Daughter    Past Surgical History:  Procedure Laterality Date  . ABDOMINAL HYSTERECTOMY    . ABDOMINOPLASTY  03/28/2004  . AXILLARY SENTINEL NODE BIOPSY  12/31/2012   Procedure: AXILLARY SENTINEL NODE BIOPSY;  Surgeon: Rolm Bookbinder, MD;  Location: [facility];  Service: General;  Laterality: Bilateral;  bilateral sentinel node  . BRAIN SURGERY     removal benign frontal lobe cyst in 1987  . BREAST ENHANCEMENT SURGERY Bilateral ~ 2008  . BREAST IMPLANT EXCHANGE Bilateral 12/11/2017   Procedure: BILATERAL IMPLANT EXCHANGE;  Surgeon: Wallace Going, DO;  Location: [facility];  Service: Plastics;  Laterality: Bilateral;  . BREAST RECONSTRUCTION WITH PLACEMENT  OF TISSUE EXPANDER AND FLEX HD (ACELLULAR HYDRATED DERMIS)  12/31/2012   Procedure: BREAST RECONSTRUCTION WITH PLACEMENT OF TISSUE EXPANDER AND FLEX HD (ACELLULAR HYDRATED DERMIS);  Surgeon: Theodoro Kos, DO;  Location: [facility];  Service: Plastics;  Laterality: Bilateral;  bilateral immediate breast reconstruction with expanders and flex hd   . CAPSULOTOMY Left 09/01/2014   Procedure: CAPSULOTOMY OF LEFT BREAST WITH LIPOFILLING FOR SYMMETRY;  Surgeon: Theodoro Kos, DO;  Location: [facility];  Service: Plastics;  Laterality: Left;  . CERVICAL CERCLAGE    . DIAGNOSTIC LAPAROSCOPY  05/12/2003   right partial  salpingectomy; lysis paraovarian adhesions left  . DILATION AND CURETTAGE OF UTERUS  2005  . ENDOMETRIAL FULGURATION  05/12/2003  . FACIAL COSMETIC SURGERY  ~ 2010  . FOOT SURGERY Bilateral 07/2012  . GYNECOLOGIC CRYOSURGERY  1998  . INCISION AND DRAINAGE OF WOUND Left 04/28/2013   Procedure: IRRIGATION AND DEBRIDEMENT LEFT BREAST WOUND, POSSIBLE CLOSURE OF WOUND WITH DRAIN PLACEMENT;  Surgeon: Theodoro Kos, DO;  Location: [facility];  Service: Plastics;  Laterality: Left;  . LATISSIMUS FLAP TO BREAST Left 09/29/2013   Procedure: LATISSIMUS FLAP TO BREAST WITH TISSUE EXPANDER PLACEMENT FOR LEFT BREAST RECONSTRUCTION;  Surgeon: Theodoro Kos, DO;  Location: [facility];  Service: Plastics;  Laterality: Left;  . LIPECTOMY Bilateral 03/28/2004   hip  . LIPOSUCTION Bilateral 03/09/2014   Procedure: LIPOSUCTION ABDOMEN W/ LIPOFILLING LATERAL BILATERAL BREAST;  Surgeon: Theodoro Kos, DO;  Location: [facility];  Service: Plastics;  Laterality: Bilateral;  LIPOSUCTION NECK  . LIPOSUCTION WITH LIPOFILLING Bilateral 09/01/2014   Procedure: LIPOSUCTION WITH LIPOFILLING;  Surgeon: Theodoro Kos, DO;  Location: [facility];  Service: Plastics;  Laterality: Bilateral;  . LYSIS OF ADHESION  03/28/2004   pelvic  . MASTECTOMY    . PORT-A-CATH REMOVAL Right 03/09/2014   Procedure: REMOVAL PORT-A-CATH;  Surgeon: Theodoro Kos, DO;  Location: [facility];  Service: Plastics;  Laterality: Right;  . PORTACATH PLACEMENT  01/27/2013   Procedure: INSERTION PORT-A-CATH;  Surgeon: Rolm Bookbinder, MD;  Location: [facility];  Service: General;  Laterality: Right;  . RADIOFREQUENCY ABLATION NERVES  05/12/2003   uterosacral nerve ablation  . REMOVAL OF BILATERAL TISSUE EXPANDERS WITH PLACEMENT OF BILATERAL BREAST IMPLANTS Bilateral 03/09/2014   Procedure: REMOVAL OF BILATERAL TISSUE EXPANDERS WITH PLACEMENT OF BILATERAL BREAST IMPLANTS;  Surgeon: Theodoro Kos, DO;   Location: [facility];  Service: Plastics;  Laterality: Bilateral;  . TEE WITHOUT CARDIOVERSION N/A 06/04/2013   Procedure: TRANSESOPHAGEAL ECHOCARDIOGRAM (TEE);  Surgeon: Jolaine Artist, MD;  Location: [facility] ENDOSCOPY;  Service: Cardiovascular;  Laterality: N/A;  . TISSUE EXPANDER PLACEMENT Left 06/16/2013   Procedure: LEFT BREAST TISSUE EXPANDER REMOVAL;  Surgeon: Theodoro Kos, DO;  Location: [place];  Service: Plastics;  Laterality: Left;  . TISSUE EXPANDER PLACEMENT Left 09/29/2013   Procedure: TISSUE EXPANDER;  Surgeon: Theodoro Kos, DO;  Location: [place];  Service: Plastics;  Laterality: Left;  . TOTAL ABDOMINAL HYSTERECTOMY W/ BILATERAL SALPINGOOPHORECTOMY  03/28/2004  . TOTAL MASTECTOMY  12/31/2012   Procedure: TOTAL MASTECTOMY;  Surgeon: Rolm Bookbinder, MD;  Location: [place];  Service: General;  Laterality: Bilateral;  bilateral total mastectomies   Social History   Social History Narrative  . Not on file     Objective: Vital Signs: There were no vitals taken for this visit.   Physical Exam   Musculoskeletal Exam: ***  CDAI Exam: No  CDAI exam completed.    Investigation: No additional findings. Component     Latest Ref Rng & Units 08/21/2017  ANA Pattern 1      HOMOGENEOUS (A)  ANA Titer 1     titer 1:80 (H)  RA Latex Turbid.     <14 IU/mL <14  Sed Rate     0 - 30 mm/hr 4  CRP     <8.0 mg/L 0.5  Anit Nuclear Antibody(ANA)     NEGATIVE POS (A)  Cyclic Citrullin Peptide Ab     Units <16    Imaging: No results found.  Speciality Comments: No specialty comments available.    Procedures:  No procedures performed Allergies: Betadine [povidone iodine]; Iodine; Shellfish-derived products; Cleocin [clindamycin hcl]; Ginger; Penicillins; and Sulfamethoxazole-trimethoprim   Assessment / Plan:     Visit Diagnoses: No diagnosis found.    Orders: No orders of the defined types were placed in this  encounter.  No orders of the defined types were placed in this encounter.   Face-to-face time spent with patient was *** minutes. 50% of time was spent in counseling and coordination of care.  Follow-Up Instructions: No Follow-up on file.   Earnestine Mealing, CMA  Note - This record has been created using Editor, commissioning.  Chart creation errors have been sought, but may not always  have been located. Such creation errors do not reflect on  the standard of medical care.

## 2018-02-10 MED FILL — ESZOPICLONE 3 MG TABS: 3 | 30 days supply | Qty: 30 | Fill #0

## 2018-02-12 ENCOUNTER — Ambulatory Visit: Payer: 59 | Admitting: Rheumatology

## 2018-03-04 ENCOUNTER — Ambulatory Visit (INDEPENDENT_AMBULATORY_CARE_PROVIDER_SITE_OTHER): Payer: 59

## 2018-03-04 ENCOUNTER — Encounter (INDEPENDENT_AMBULATORY_CARE_PROVIDER_SITE_OTHER): Payer: Self-pay | Admitting: Orthopedic Surgery

## 2018-03-04 ENCOUNTER — Ambulatory Visit (INDEPENDENT_AMBULATORY_CARE_PROVIDER_SITE_OTHER): Payer: 59 | Admitting: Orthopedic Surgery

## 2018-03-04 DIAGNOSIS — M25551 Pain in right hip: Secondary | ICD-10-CM

## 2018-03-06 ENCOUNTER — Ambulatory Visit: Payer: 59 | Admitting: Rheumatology

## 2018-03-07 ENCOUNTER — Encounter (INDEPENDENT_AMBULATORY_CARE_PROVIDER_SITE_OTHER): Payer: Self-pay | Admitting: Orthopedic Surgery

## 2018-03-07 NOTE — Progress Notes (Signed)
Office Visit Note   Patient: Jane Gutierrez           Date of Birth: [birth year]           MRN: [id number] Visit Date: 03/04/2018 Requested by: Reynold Bowen, MD [place], [place] [id number] PCP: Reynold Bowen, MD  Subjective: Chief Complaint  Patient presents with  . Right Hip - Pain    HPI: Jane Gutierrez is a patient with right hip pain for 10 days.  Denies any history of injury.  Been icing it and using ibuprofen.  She states that her trochanteric area is tender to touch.  She is having some radiation down the leg but not below the knee.  Does not wake her from sleep at night.  Had trochanteric injection 4 years ago which helped similar symptoms.  She has been doing spin classes.  She had breast surgery in December for implant removal and replacement.  She had workup by rheumatology which was negative for any type of autoimmune problem.              ROS: All systems reviewed are negative as they relate to the chief complaint within the history of present illness.  Patient denies  fevers or chills.   Assessment & Plan: Visit Diagnoses:  1. Pain in right hip     Plan: Impression is right hip pain possible trochanteric bursitis with normal plain radiographs.  With her history of have a very low threshold to do any type of advanced imaging.  We will try a trochanteric injection today but if that does not help then we will need to proceed with MRI scanning of the pelvis and possibly back.  She is not having much in the way of specific back pain.  Follow-Up Instructions: Return if symptoms worsen or fail to improve.   Orders:  Orders Placed This Encounter  Procedures  . XR HIP UNILAT W OR W/O PELVIS 2-3 VIEWS RIGHT   No orders of the defined types were placed in this encounter.     Procedures: No procedures performed   Clinical Data: No additional findings.  Objective: Vital Signs: There were no vitals taken for this visit.  Physical Exam:   Constitutional: Patient  appears well-developed HEENT:  Head: Normocephalic Eyes:EOM are normal Neck: Normal range of motion Cardiovascular: Normal rate Pulmonary/chest: Effort normal Neurologic: Patient is alert Skin: Skin is warm Psychiatric: Patient has normal mood and affect    Ortho Exam: Orthopedic exam demonstrates trochanteric tenderness on the right compared to the left.  No groin pain with internal/external rotation of the right or left leg.  No nerve root tension signs.  No definite paresthesias L1-S1 bilaterally.  Pedal pulses palpable.  No muscle atrophy in the legs.  Specialty Comments:  No specialty comments available.  Imaging: No results found.   PMFS History: Patient Active Problem List   Diagnosis Date Noted  . Dyspnea 09/11/2015  . Breast cancer of upper-outer quadrant of left female breast ([place]) 06/28/2015  . Right hip pain 09/19/2014  . Right knee pain 09/19/2014  . Dyspnea on exertion 08/08/2014  . Heart palpitations 08/08/2014  . Acquired absence of breast and absent nipple 03/09/2014  . Pre-operative cardiovascular examination 09/09/2013  . Other chronic pulmonary heart diseases 05/27/2013  . SIRS (systemic inflammatory response syndrome) ([place]) 04/14/2013  . Anemia 04/14/2013  . Dehydration 04/14/2013  . Fever 04/14/2013  . Allergy   . IBS (irritable bowel syndrome)   . Thyroid disease   .  ADD (attention deficit disorder)   . Lyme disease   . Asthma   . Hypothyroidism   . Contact lens/glasses fitting    Past Medical History:  Diagnosis Date  . Abnormally small mouth   . ADD (attention deficit disorder)   . ADHD   . Allergy   . Anxiety   . Asthma    prn inhaler  . Blood transfusion without reported diagnosis   . Breast cancer ([place])   . Dental crowns present   . Difficulty swallowing pills   . History of breast cancer   . History of cervical cancer   . History of palpitations    last echo 08/22/2014  . Hypothyroidism   . IBS (irritable bowel syndrome)      Family History  Problem Relation Age of Onset  . Cancer Mother 15       uterine, mult myeloma  . CVA Father   . Cancer Sister 67       breast  . Cancer Maternal Grandmother 101       breast  . Lupus Sister        twin sister  . Sarcoidosis Sister        twin sister  . Rheum arthritis Sister        twin sister  . Celiac disease Daughter     Past Surgical History:  Procedure Laterality Date  . ABDOMINAL HYSTERECTOMY    . ABDOMINOPLASTY  03/28/2004  . AXILLARY SENTINEL NODE BIOPSY  12/31/2012   Procedure: AXILLARY SENTINEL NODE BIOPSY;  Surgeon: Rolm Bookbinder, MD;  Location: [facility];  Service: General;  Laterality: Bilateral;  bilateral sentinel node  . BRAIN SURGERY     removal benign frontal lobe cyst in 1987  . BREAST ENHANCEMENT SURGERY Bilateral ~ 2008  . BREAST IMPLANT EXCHANGE Bilateral 12/11/2017   Procedure: BILATERAL IMPLANT EXCHANGE;  Surgeon: Wallace Going, DO;  Location: [facility];  Service: Plastics;  Laterality: Bilateral;  . BREAST RECONSTRUCTION WITH PLACEMENT OF TISSUE EXPANDER AND FLEX HD (ACELLULAR HYDRATED DERMIS)  12/31/2012   Procedure: BREAST RECONSTRUCTION WITH PLACEMENT OF TISSUE EXPANDER AND FLEX HD (ACELLULAR HYDRATED DERMIS);  Surgeon: Theodoro Kos, DO;  Location: [facility];  Service: Plastics;  Laterality: Bilateral;  bilateral immediate breast reconstruction with expanders and flex hd   . CAPSULOTOMY Left 09/01/2014   Procedure: CAPSULOTOMY OF LEFT BREAST WITH LIPOFILLING FOR SYMMETRY;  Surgeon: Theodoro Kos, DO;  Location: [facility];  Service: Plastics;  Laterality: Left;  . CERVICAL CERCLAGE    . DIAGNOSTIC LAPAROSCOPY  05/12/2003   right partial salpingectomy; lysis paraovarian adhesions left  . DILATION AND CURETTAGE OF UTERUS  2005  . ENDOMETRIAL FULGURATION  05/12/2003  . FACIAL COSMETIC SURGERY  ~ 2010  . FOOT SURGERY Bilateral 07/2012  . GYNECOLOGIC CRYOSURGERY  1998  .  INCISION AND DRAINAGE OF WOUND Left 04/28/2013   Procedure: IRRIGATION AND DEBRIDEMENT LEFT BREAST WOUND, POSSIBLE CLOSURE OF WOUND WITH DRAIN PLACEMENT;  Surgeon: Theodoro Kos, DO;  Location: [facility];  Service: Plastics;  Laterality: Left;  . LATISSIMUS FLAP TO BREAST Left 09/29/2013   Procedure: LATISSIMUS FLAP TO BREAST WITH TISSUE EXPANDER PLACEMENT FOR LEFT BREAST RECONSTRUCTION;  Surgeon: Theodoro Kos, DO;  Location: [facility];  Service: Plastics;  Laterality: Left;  . LIPECTOMY Bilateral 03/28/2004   hip  . LIPOSUCTION Bilateral 03/09/2014   Procedure: LIPOSUCTION ABDOMEN W/ LIPOFILLING LATERAL BILATERAL BREAST;  Surgeon: Theodoro Kos, DO;  Location: [facility];  Service: Plastics;  Laterality: Bilateral;  LIPOSUCTION NECK  . LIPOSUCTION WITH LIPOFILLING Bilateral 09/01/2014   Procedure: LIPOSUCTION WITH LIPOFILLING;  Surgeon: Theodoro Kos, DO;  Location: [facility];  Service: Plastics;  Laterality: Bilateral;  . LYSIS OF ADHESION  03/28/2004   pelvic  . MASTECTOMY    . PORT-A-CATH REMOVAL Right 03/09/2014   Procedure: REMOVAL PORT-A-CATH;  Surgeon: Theodoro Kos, DO;  Location: [facility];  Service: Plastics;  Laterality: Right;  . PORTACATH PLACEMENT  01/27/2013   Procedure: INSERTION PORT-A-CATH;  Surgeon: Rolm Bookbinder, MD;  Location: [facility];  Service: General;  Laterality: Right;  . RADIOFREQUENCY ABLATION NERVES  05/12/2003   uterosacral nerve ablation  . REMOVAL OF BILATERAL TISSUE EXPANDERS WITH PLACEMENT OF BILATERAL BREAST IMPLANTS Bilateral 03/09/2014   Procedure: REMOVAL OF BILATERAL TISSUE EXPANDERS WITH PLACEMENT OF BILATERAL BREAST IMPLANTS;  Surgeon: Theodoro Kos, DO;  Location: [facility];  Service: Plastics;  Laterality: Bilateral;  . TEE WITHOUT CARDIOVERSION N/A 06/04/2013   Procedure: TRANSESOPHAGEAL ECHOCARDIOGRAM (TEE);  Surgeon: Jolaine Artist, MD;  Location: [facility] ENDOSCOPY;   Service: Cardiovascular;  Laterality: N/A;  . TISSUE EXPANDER PLACEMENT Left 06/16/2013   Procedure: LEFT BREAST TISSUE EXPANDER REMOVAL;  Surgeon: Theodoro Kos, DO;  Location: [facility];  Service: Plastics;  Laterality: Left;  . TISSUE EXPANDER PLACEMENT Left 09/29/2013   Procedure: TISSUE EXPANDER;  Surgeon: Theodoro Kos, DO;  Location: [facility];  Service: Plastics;  Laterality: Left;  . TOTAL ABDOMINAL HYSTERECTOMY W/ BILATERAL SALPINGOOPHORECTOMY  03/28/2004  . TOTAL MASTECTOMY  12/31/2012   Procedure: TOTAL MASTECTOMY;  Surgeon: Rolm Bookbinder, MD;  Location: [facility];  Service: General;  Laterality: Bilateral;  bilateral total mastectomies   Social History   Occupational History  . Not on file  Tobacco Use  . Smoking status: Never Smoker  . Smokeless tobacco: Never Used  Substance and Sexual Activity  . Alcohol use: No    Alcohol/week: 0.0 oz    Frequency: Never    Comment: occasionally  . Drug use: No  . Sexual activity: Yes

## 2018-03-09 ENCOUNTER — Ambulatory Visit (INDEPENDENT_AMBULATORY_CARE_PROVIDER_SITE_OTHER): Payer: 59 | Admitting: Orthopedic Surgery

## 2018-03-09 MED FILL — valACYclovir HCL 1 GM TABS: 1 | 30 days supply | Qty: 30 | Fill #2

## 2018-03-18 ENCOUNTER — Ambulatory Visit: Payer: 59 | Admitting: Rheumatology

## 2018-03-23 MED FILL — ESZOPICLONE 3 MG TABS: 3 | 30 days supply | Qty: 30 | Fill #1

## 2018-04-01 ENCOUNTER — Ambulatory Visit: Payer: 59 | Admitting: Family Medicine

## 2018-04-01 ENCOUNTER — Encounter: Payer: Self-pay | Admitting: Family Medicine

## 2018-04-01 VITALS — BP 124/82 | Ht 65.0 in | Wt 148.0 lb

## 2018-04-01 DIAGNOSIS — M25551 Pain in right hip: Secondary | ICD-10-CM | POA: Diagnosis not present

## 2018-04-01 NOTE — Assessment & Plan Note (Signed)
Patient is here with signs and symptoms consistent with gluteus medius tendinitis and piriformis tendinitis.  Differential diagnosis includes sartorius strain, hip labrum pathology, obturator nerve/muscle irritation, or meralgia paresthetica. -Home exercise program focusing on piriformis, hip abductors, and gluteus minimus strengthening and stretching -Encouraged OTC anti-inflammatories -Discussed relative rest and avoidance of certain aggravating activities. -Referral to [facility] rehab for possible dry needling -- per patient's request. -Follow-up in 4 weeks  Next: Consideration for further imaging may be warranted if symptoms persist or worsen due to her history of breast cancer.

## 2018-04-01 NOTE — Progress Notes (Signed)
HPI  CC: Right hip/groin pain Patient is a breast cancer survivor and is here for a new complaints of right-sided hip and groin pain.  She states that pain is been ongoing for the past 5-6 weeks.  Pain is located to the anterior and lateral aspect of the hip, with some radiation slightly posteriorly, and occasional extension distally across the anterior aspect of the upper leg towards the medial femoral epicondyle.  Pain seems to be worse after she has been sitting for prolonged period of time.  Patient denies any injury, trauma, or event which may have caused this pain.  Patient was seen by her primary care provider who performed a greater trochanteric bursal injection.  This seemed to help to a slight degree for about 3 days, but patient is unsure if this could have been due to the fact that she was "taking it easy" during that time.  Patient can elicit this pain when she brings her right foot up in hip flexion and external rotation to tie her shoes.  Sleeping on the affected hip does not seem to bring out a significant amount of discomfort.  She denies any numbness, weakness, or paresthesias.  No prior hip injuries.  She does endorse some occasional "popping" of the hip itself but this is mostly nonpainful.  No back pain no true knee pain.  Traumatic: No   Quality: Achy and dull  Duration: 5-6 weeks Timing: prolonged sitting Improving/Worsening: Unchanged Makes better: Rest and activity Makes worse: Tying shoes Associated symptoms: none   Previous Interventions Tried: Greater trochanteric bursal injection  Past Injuries: none  Past Surgeries: Bilateral mastectomy Smoking: none  Family Hx: Noncontributory  ROS: Per HPI; in addition no fever, no rash, no additional weakness, no additional numbness, no additional paresthesias, and no additional falls/injury.   Objective: BP 124/82   Ht 5\' 5"  (1.651 m)   Wt 148 lb (67.1 kg)   BMI 24.63 kg/m  Gen: NAD, well groomed, a/o x3, normal  affect.  CV: Well-perfused. Warm.  Resp: Non-labored.  Neuro: Sensation intact throughout. No gross coordination deficits.  Gait: Nonpathologic posture, unremarkable stride without signs of limp or balance issues. Hip, Right: TTP noted at the anterior/lateral/slightly posterior hip and groin proximal to the greater trochanter, with some involvement over the piriformis muscle. No obvious rash, erythema, ecchymosis, or edema. ROM full in all directions (IR: 80/ ER: 80/Flex: 120/Ext: 100/Abd: 45/Add: 45); Strength 5/5 in IR/ER/Flex/Ext/Abd/Add.  Symptoms were reproduced with active hip internal rotation at 90 of flexion, and passive hip external rotation at 90 of flexion.  Pelvic alignment unremarkable to inspection and palpation. Standing hip rotation and gait without trendelenburg / unsteadiness. Greater trochanter without tenderness to palpation. No SI joint tenderness and normal minimal SI movement.   Assessment and Plan:  Right hip pain Patient is here with signs and symptoms consistent with gluteus medius tendinitis and piriformis tendinitis.  Differential diagnosis includes sartorius strain, hip labrum pathology, obturator nerve/muscle irritation, or meralgia paresthetica. -Home exercise program focusing on piriformis, hip abductors, and gluteus minimus strengthening and stretching -Encouraged OTC anti-inflammatories -Discussed relative rest and avoidance of certain aggravating activities. -Referral to [facility] rehab for possible dry needling -- per patient's request. -Follow-up in 4 weeks  Next: Consideration for further imaging may be warranted if symptoms persist or worsen due to her history of breast cancer.   Orders Placed This Encounter  Procedures  . Ambulatory referral to Physical Therapy    Referral Priority:   Routine  Referral Type:   Physical Medicine    Referral Reason:   Specialty Services Required    Requested Specialty:   Physical Therapy    Number of Visits  Requested:   [place], MD,MS [facility] Fellow 04/01/2018 5:34 PM

## 2018-04-03 MED FILL — DEXTROAMPHETAMINE 10 MG TAB: 10 | 90 days supply | Qty: 270 | Fill #0

## 2018-04-03 MED FILL — SYNTHROID 100 MCG TABLET: 100 | 90 days supply | Qty: 90 | Fill #0

## 2018-04-06 ENCOUNTER — Other Ambulatory Visit: Payer: Self-pay

## 2018-04-06 ENCOUNTER — Encounter: Payer: Self-pay | Admitting: Physical Therapy

## 2018-04-06 ENCOUNTER — Ambulatory Visit: Payer: 59 | Attending: Family Medicine | Admitting: Physical Therapy

## 2018-04-06 DIAGNOSIS — M25551 Pain in right hip: Secondary | ICD-10-CM | POA: Insufficient documentation

## 2018-04-06 DIAGNOSIS — M6281 Muscle weakness (generalized): Secondary | ICD-10-CM | POA: Insufficient documentation

## 2018-04-06 DIAGNOSIS — M25651 Stiffness of right hip, not elsewhere classified: Secondary | ICD-10-CM | POA: Diagnosis not present

## 2018-04-06 NOTE — Therapy (Signed)
[place], [place], [address] Phone: [phone]   Fax:  [phone]  Physical Therapy Evaluation  Patient Details  Name: Jane Gutierrez MRN: [id number] Date of Birth: 1956/03/19 Referring Provider: Elberta Leatherwood md   Encounter Date: 04/06/2018  PT End of Session - 04/06/18 1511    Visit Number  1    Number of Visits  13    Date for PT Re-Evaluation  05/18/18    Authorization Type  [facility]    PT Start Time  1418    PT Stop Time  1502    PT Time Calculation (min)  44 min    Activity Tolerance  Patient tolerated treatment well    Behavior During Therapy  [facility] for tasks assessed/performed       Past Medical History:  Diagnosis Date  . Abnormally small mouth   . ADD (attention deficit disorder)   . ADHD   . Allergy   . Anxiety    pt reported she doesn't have anxiety  . Asthma    prn inhaler  . Blood transfusion without reported diagnosis   . Breast cancer ([place])   . Dental crowns present   . Difficulty swallowing pills   . History of breast cancer   . History of cervical cancer   . History of palpitations    last echo 08/22/2014  . Hypothyroidism   . IBS (irritable bowel syndrome)     Past Surgical History:  Procedure Laterality Date  . ABDOMINAL HYSTERECTOMY    . ABDOMINOPLASTY  03/28/2004  . AXILLARY SENTINEL NODE BIOPSY  12/31/2012   Procedure: AXILLARY SENTINEL NODE BIOPSY;  Surgeon: Rolm Bookbinder, MD;  Location: [place];  Service: General;  Laterality: Bilateral;  bilateral sentinel node  . BRAIN SURGERY     removal benign frontal lobe cyst in 1987  . BREAST ENHANCEMENT SURGERY Bilateral ~ 2008  . BREAST IMPLANT EXCHANGE Bilateral 12/11/2017   Procedure: BILATERAL IMPLANT EXCHANGE;  Surgeon: Wallace Going, DO;  Location: [place];  Service: Plastics;  Laterality: Bilateral;  . BREAST RECONSTRUCTION WITH PLACEMENT OF TISSUE EXPANDER AND FLEX HD  (ACELLULAR HYDRATED DERMIS)  12/31/2012   Procedure: BREAST RECONSTRUCTION WITH PLACEMENT OF TISSUE EXPANDER AND FLEX HD (ACELLULAR HYDRATED DERMIS);  Surgeon: Theodoro Kos, DO;  Location: [place];  Service: Plastics;  Laterality: Bilateral;  bilateral immediate breast reconstruction with expanders and flex hd   . CAPSULOTOMY Left 09/01/2014   Procedure: CAPSULOTOMY OF LEFT BREAST WITH LIPOFILLING FOR SYMMETRY;  Surgeon: Theodoro Kos, DO;  Location: [place];  Service: Plastics;  Laterality: Left;  . CERVICAL CERCLAGE    . DIAGNOSTIC LAPAROSCOPY  05/12/2003   right partial salpingectomy; lysis paraovarian adhesions left  . DILATION AND CURETTAGE OF UTERUS  2005  . ENDOMETRIAL FULGURATION  05/12/2003  . FACIAL COSMETIC SURGERY  ~ 2010  . FOOT SURGERY Bilateral 07/2012  . GYNECOLOGIC CRYOSURGERY  1998  . INCISION AND DRAINAGE OF WOUND Left 04/28/2013   Procedure: IRRIGATION AND DEBRIDEMENT LEFT BREAST WOUND, POSSIBLE CLOSURE OF WOUND WITH DRAIN PLACEMENT;  Surgeon: Theodoro Kos, DO;  Location: [place];  Service: Plastics;  Laterality: Left;  . LATISSIMUS FLAP TO BREAST Left 09/29/2013   Procedure: LATISSIMUS FLAP TO BREAST WITH TISSUE EXPANDER PLACEMENT FOR LEFT BREAST RECONSTRUCTION;  Surgeon: Theodoro Kos, DO;  Location: [place];  Service: Plastics;  Laterality: Left;  . LIPECTOMY Bilateral 03/28/2004   hip  .  LIPOSUCTION Bilateral 03/09/2014   Procedure: LIPOSUCTION ABDOMEN W/ LIPOFILLING LATERAL BILATERAL BREAST;  Surgeon: Theodoro Kos, DO;  Location: [place];  Service: Plastics;  Laterality: Bilateral;  LIPOSUCTION NECK  . LIPOSUCTION WITH LIPOFILLING Bilateral 09/01/2014   Procedure: LIPOSUCTION WITH LIPOFILLING;  Surgeon: Theodoro Kos, DO;  Location: [place];  Service: Plastics;  Laterality: Bilateral;  . LYSIS OF ADHESION  03/28/2004   pelvic  . MASTECTOMY    . PORT-A-CATH REMOVAL Right 03/09/2014    Procedure: REMOVAL PORT-A-CATH;  Surgeon: Theodoro Kos, DO;  Location: [place];  Service: Plastics;  Laterality: Right;  . PORTACATH PLACEMENT  01/27/2013   Procedure: INSERTION PORT-A-CATH;  Surgeon: Rolm Bookbinder, MD;  Location: [place];  Service: General;  Laterality: Right;  . RADIOFREQUENCY ABLATION NERVES  05/12/2003   uterosacral nerve ablation  . REMOVAL OF BILATERAL TISSUE EXPANDERS WITH PLACEMENT OF BILATERAL BREAST IMPLANTS Bilateral 03/09/2014   Procedure: REMOVAL OF BILATERAL TISSUE EXPANDERS WITH PLACEMENT OF BILATERAL BREAST IMPLANTS;  Surgeon: Theodoro Kos, DO;  Location: [place];  Service: Plastics;  Laterality: Bilateral;  . TEE WITHOUT CARDIOVERSION N/A 06/04/2013   Procedure: TRANSESOPHAGEAL ECHOCARDIOGRAM (TEE);  Surgeon: Jolaine Artist, MD;  Location: [facility];  Service: Cardiovascular;  Laterality: N/A;  . TISSUE EXPANDER PLACEMENT Left 06/16/2013   Procedure: LEFT BREAST TISSUE EXPANDER REMOVAL;  Surgeon: Theodoro Kos, DO;  Location: [place];  Service: Plastics;  Laterality: Left;  . TISSUE EXPANDER PLACEMENT Left 09/29/2013   Procedure: TISSUE EXPANDER;  Surgeon: Theodoro Kos, DO;  Location: [place];  Service: Plastics;  Laterality: Left;  . TOTAL ABDOMINAL HYSTERECTOMY W/ BILATERAL SALPINGOOPHORECTOMY  03/28/2004  . TOTAL MASTECTOMY  12/31/2012   Procedure: TOTAL MASTECTOMY;  Surgeon: Rolm Bookbinder, MD;  Location: [place];  Service: General;  Laterality: Bilateral;  bilateral total mastectomies    There were no vitals filed for this visit.   Subjective Assessment - 04/06/18 1430    Subjective  pt is a 62 y.o F with CC of R hip pain that started 2-3 months ago which she noted occured when she was getting into or out of the care and reports it felt like it would unhinge. reported since then she got a catch in the front of her hip that wouldn't calm down. she had gotten shot  of cortizon in the lateral aspct of the hip with no improvement after 3 days. pain will fluctuate from the groin, lateral hp and to the glute and is unsure what is going on.    How long can you sit comfortably?  5-10 min    How long can you stand comfortably?  unlimited    How long can you walk comfortably?  unlimited    Diagnostic tests  x-ray    Patient Stated Goals  to get better, be knowledgable of ergonomics.     Currently in Pain?  Yes    Pain Score  3  at wrost 6/10 in the lateral hip and 9/10 in the groin    Pain Location  Hip    Pain Orientation  Right    Pain Descriptors / Indicators  Aching;Sore    Pain Type  Chronic pain    Pain Onset  More than a month ago    Pain Frequency  Intermittent    Aggravating Factors   elliptical, squatting, sitting, hip ER     Pain Relieving Factors  medication, laying down, heat.  [facility] PT Assessment - 04/06/18 1423      Assessment   Medical Diagnosis  R hip pain    Referring Provider  McKeag, Marylynn Pearson md    Onset Date/Surgical Date  -- 2-3 months    Hand Dominance  Right    Next MD Visit  1 month    Prior Therapy  yes      Precautions   Precaution Comments  avoid running/ hopping/ clicking      Restrictions   Weight Bearing Restrictions  No      Balance Screen   Has the patient fallen in the past 6 months  No    Has the patient had a decrease in activity level because of a fear of falling?   No    Is the patient reluctant to leave their home because of a fear of falling?   No      Home Environment   Living Environment  Private residence    Living Arrangements  Spouse/significant other    Available Help at Discharge  Family;Available PRN/intermittently    Type of Home  House    Home Access  Stairs to enter    Entrance Stairs-Number of Steps  2    Entrance Stairs-Rails  Left ascending    Home Layout  One level      Prior Function   Level of Independence  Independent with basic ADLs    Vocation  Full time employment  physician liasion    Leisure  golf, walking, going to the gym      Cognition   Overall Cognitive Status  Within Functional Limits for tasks assessed      Observation/Other Assessments   Focus on Therapeutic Outcomes (FOTO)   29% limited      Posture/Postural Control   Posture/Postural Control  Postural limitations    Postural Limitations  Rounded Shoulders;Forward head      ROM / Strength   AROM / PROM / Strength  AROM;Strength      AROM   AROM Assessment Site  Hip    Right/Left Hip  Right;Left    Right Hip Extension  10    Right Hip Flexion  85    Right Hip External Rotation   21    Right Hip Internal Rotation   40    Right Hip ABduction  30    Left Hip Extension  10    Left Hip Flexion  96    Left Hip External Rotation   20    Left Hip Internal Rotation   41    Left Hip ABduction  48      Strength   Strength Assessment Site  Hip    Right/Left Hip  Right;Left    Right Hip Flexion  4+/5    Right Hip Extension  4/5    Right Hip External Rotation   4/5    Right Hip Internal Rotation  4/5    Right Hip ABduction  4-/5    Right Hip ADduction  4+/5    Left Hip Flexion  4+/5    Left Hip Extension  4/5    Left Hip External Rotation  4/5    Left Hip Internal Rotation  4/5    Left Hip ABduction  4-/5    Left Hip ADduction  4+/5      Palpation   Palpation comment  TTP along the greater trochanter, pectineus / longus, and glutee medius and piriformis tightness  Special Tests    Special Tests  Hip Special Tests    Hip Special Tests   Hip Scouring;Ober's Test;Thomas Test      [facility] Test    Findings  Positive    Side  Right      Ober's Test   Findings  Negative    Side  Right      Hip Scouring   Findings  Negative    Side  Right      Ambulation/Gait   Ambulation/Gait  Yes    Gait Pattern  Step-to pattern                Objective measurements completed on examination: See above findings.      [place] Adult PT Treatment/Exercise - 04/06/18 1423       Knee/Hip Exercises: Stretches   ITB Stretch  2 reps;30 seconds in L sidelying      Manual Therapy   Manual therapy comments  MTPR along the R glute med/ piriformis             PT Education - 04/06/18 1424    Education provided  Yes    Education Details  evaluation findings, POC,goals, HEP with proper from/ rationale, anatomy of the area involved. benefits of manual trigger point release     Person(s) Educated  Patient    Methods  Explanation;Handout    Comprehension  Verbalized understanding       PT Short Term Goals - 04/06/18 1519      PT SHORT TERM GOAL #1   Title  pt to be I with inital HEP    Time  3    Period  Weeks    Status  New    Target Date  04/27/18      PT SHORT TERM GOAL #2   Title  pt to verbalize/ demo proper posture in multiple positions and proper lifting mechanics to prevent and reduce R hip pain    Time  3    Period  Weeks    Status  New    Target Date  04/27/18        PT Long Term Goals - 04/06/18 1520      PT LONG TERM GOAL #1   Title  pt to increase bil hip abductor/ extensor strength to >/= 4+/5 with no pain during testing for hip stability     Time  6    Period  Weeks    Status  New    Target Date  05/18/18      PT LONG TERM GOAL #2   Title  increase hip abduction ROM to demo reduced adductor tightness with </=1/10 pain     Time  6    Period  Weeks    Status  New    Target Date  05/18/18      PT LONG TERM GOAL #3   Title  pt to be able to sit/ stand and walk >/= 60 min with </= 1/10 pain and no report of catching or popping    Time  6    Period  Weeks    Status  New    Target Date  05/18/18      PT LONG TERM GOAL #4   Title  increase FOTO score to </=25% limited to demo improvement in function    Time  6    Period  Weeks    Status  New    Target Date  05/18/18  PT LONG TERM GOAL #5   Title  pt to be I with all HEP given as of last visit to maintain and progress current level of functoin     Time  6    Period   Weeks    Status  New    Target Date  05/18/18             Plan - 04/06/18 1511    Clinical Impression Statement  pt presents to OPPT with CC of R hip pain inthe groin and lateral hip with noted popping. she demosntrates limited R hip abduction and exhibits functional ROM in all planes. weakness in bil hip abductors with soreness during testing. special testing was negative. TTP noted over the greater trochanter, proximal hip adductors, and glute medisu/ piriformis with multple trigger points noted. she would benefit from physical therapy to decrase R hip pain, impove mobility, and return pt to PLOF by addressing the defictis listed.     History and Personal Factors relevant to plan of care:  hx of Cx, significant PSH,     Clinical Presentation  Evolving    Clinical Presentation due to:  limited hip strength, pain in multiple aspects of the hip, fluctuating pain following injection,     Clinical Decision Making  Moderate    Rehab Potential  Good    PT Frequency  2x / week    PT Duration  6 weeks    PT Treatment/Interventions  ADLs/Self Care Home Management;Cryotherapy;Gait training;Stair training;Therapeutic activities;Therapeutic exercise;Manual techniques;Taping;Dry needling;Patient/family education;Passive range of motion;Balance training    PT Next Visit Plan  (hx of cx no heat/e-stim), Review/ update HEP PRN, DN for glute med/ pirifomris and adductors, hip strenthening, posture/ ergonomics,     PT Home Exercise Plan  ITB stretching (in L sidelying), standing hip abduction, adductor stretching, thomas stretching.     Consulted and Agree with Plan of Care  Patient       Patient will benefit from skilled therapeutic intervention in order to improve the following deficits and impairments:  Pain, Improper body mechanics, Postural dysfunction, Decreased strength, Decreased range of motion, Decreased endurance, Decreased activity tolerance, Decreased balance  Visit Diagnosis: Pain in  right hip  Muscle weakness (generalized)  Stiffness of right hip, not elsewhere classified     Problem List Patient Active Problem List   Diagnosis Date Noted  . Dyspnea 09/11/2015  . Breast cancer of upper-outer quadrant of left female breast ([place]) 06/28/2015  . Right hip pain 09/19/2014  . Right knee pain 09/19/2014  . Dyspnea on exertion 08/08/2014  . Heart palpitations 08/08/2014  . Acquired absence of breast and absent nipple 03/09/2014  . Pre-operative cardiovascular examination 09/09/2013  . Other chronic pulmonary heart diseases 05/27/2013  . SIRS (systemic inflammatory response syndrome) ([place]) 04/14/2013  . Anemia 04/14/2013  . Dehydration 04/14/2013  . Fever 04/14/2013  . Allergy   . IBS (irritable bowel syndrome)   . Thyroid disease   . ADD (attention deficit disorder)   . Lyme disease   . Asthma   . Hypothyroidism   . Contact lens/glasses fitting    Starr Lake PT, DPT, LAT, ATC  04/06/18  3:27 PM      [facility] [address] [place], [place], [address] Phone: [phone]   Fax:  [phone]  Name: Jane Gutierrez MRN: [id number] Date of Birth: 1956/03/24

## 2018-04-07 ENCOUNTER — Encounter: Payer: Self-pay | Admitting: Physical Therapy

## 2018-04-07 ENCOUNTER — Ambulatory Visit: Payer: 59 | Admitting: Physical Therapy

## 2018-04-07 DIAGNOSIS — M25651 Stiffness of right hip, not elsewhere classified: Secondary | ICD-10-CM

## 2018-04-07 DIAGNOSIS — M25551 Pain in right hip: Secondary | ICD-10-CM

## 2018-04-07 DIAGNOSIS — M6281 Muscle weakness (generalized): Secondary | ICD-10-CM | POA: Diagnosis not present

## 2018-04-07 NOTE — Therapy (Signed)
[place], [place], [address] Phone: [phone]   Fax:  [phone]  Physical Therapy Treatment  Patient Details  Name: Jane Gutierrez MRN: [id number] Date of Birth: 1956-07-30 Referring Provider: Elberta Leatherwood md   Encounter Date: 04/07/2018  PT End of Session - 04/07/18 0800    Visit Number  2    Number of Visits  13    Date for PT Re-Evaluation  05/18/18    Authorization Type  [facility]    PT Start Time  0801    PT Stop Time  0855    PT Time Calculation (min)  54 min    Activity Tolerance  Patient tolerated treatment well    Behavior During Therapy  [facility] for tasks assessed/performed       Past Medical History:  Diagnosis Date  . Abnormally small mouth   . ADD (attention deficit disorder)   . ADHD   . Allergy   . Anxiety    pt reported she doesn't have anxiety  . Asthma    prn inhaler  . Blood transfusion without reported diagnosis   . Breast cancer ([place])   . Dental crowns present   . Difficulty swallowing pills   . History of breast cancer   . History of cervical cancer   . History of palpitations    last echo 08/22/2014  . Hypothyroidism   . IBS (irritable bowel syndrome)     Past Surgical History:  Procedure Laterality Date  . ABDOMINAL HYSTERECTOMY    . ABDOMINOPLASTY  03/28/2004  . AXILLARY SENTINEL NODE BIOPSY  12/31/2012   Procedure: AXILLARY SENTINEL NODE BIOPSY;  Surgeon: Rolm Bookbinder, MD;  Location: [place];  Service: General;  Laterality: Bilateral;  bilateral sentinel node  . BRAIN SURGERY     removal benign frontal lobe cyst in 1987  . BREAST ENHANCEMENT SURGERY Bilateral ~ 2008  . BREAST IMPLANT EXCHANGE Bilateral 12/11/2017   Procedure: BILATERAL IMPLANT EXCHANGE;  Surgeon: Wallace Going, DO;  Location: [place];  Service: Plastics;  Laterality: Bilateral;  . BREAST RECONSTRUCTION WITH PLACEMENT OF TISSUE EXPANDER AND FLEX HD  (ACELLULAR HYDRATED DERMIS)  12/31/2012   Procedure: BREAST RECONSTRUCTION WITH PLACEMENT OF TISSUE EXPANDER AND FLEX HD (ACELLULAR HYDRATED DERMIS);  Surgeon: Theodoro Kos, DO;  Location: [place];  Service: Plastics;  Laterality: Bilateral;  bilateral immediate breast reconstruction with expanders and flex hd   . CAPSULOTOMY Left 09/01/2014   Procedure: CAPSULOTOMY OF LEFT BREAST WITH LIPOFILLING FOR SYMMETRY;  Surgeon: Theodoro Kos, DO;  Location: [place];  Service: Plastics;  Laterality: Left;  . CERVICAL CERCLAGE    . DIAGNOSTIC LAPAROSCOPY  05/12/2003   right partial salpingectomy; lysis paraovarian adhesions left  . DILATION AND CURETTAGE OF UTERUS  2005  . ENDOMETRIAL FULGURATION  05/12/2003  . FACIAL COSMETIC SURGERY  ~ 2010  . FOOT SURGERY Bilateral 07/2012  . GYNECOLOGIC CRYOSURGERY  1998  . INCISION AND DRAINAGE OF WOUND Left 04/28/2013   Procedure: IRRIGATION AND DEBRIDEMENT LEFT BREAST WOUND, POSSIBLE CLOSURE OF WOUND WITH DRAIN PLACEMENT;  Surgeon: Theodoro Kos, DO;  Location: [place];  Service: Plastics;  Laterality: Left;  . LATISSIMUS FLAP TO BREAST Left 09/29/2013   Procedure: LATISSIMUS FLAP TO BREAST WITH TISSUE EXPANDER PLACEMENT FOR LEFT BREAST RECONSTRUCTION;  Surgeon: Theodoro Kos, DO;  Location: [place];  Service: Plastics;  Laterality: Left;  . LIPECTOMY Bilateral 03/28/2004   hip  .  LIPOSUCTION Bilateral 03/09/2014   Procedure: LIPOSUCTION ABDOMEN W/ LIPOFILLING LATERAL BILATERAL BREAST;  Surgeon: Theodoro Kos, DO;  Location: [place];  Service: Plastics;  Laterality: Bilateral;  LIPOSUCTION NECK  . LIPOSUCTION WITH LIPOFILLING Bilateral 09/01/2014   Procedure: LIPOSUCTION WITH LIPOFILLING;  Surgeon: Theodoro Kos, DO;  Location: [place];  Service: Plastics;  Laterality: Bilateral;  . LYSIS OF ADHESION  03/28/2004   pelvic  . MASTECTOMY    . PORT-A-CATH REMOVAL Right 03/09/2014    Procedure: REMOVAL PORT-A-CATH;  Surgeon: Theodoro Kos, DO;  Location: [place];  Service: Plastics;  Laterality: Right;  . PORTACATH PLACEMENT  01/27/2013   Procedure: INSERTION PORT-A-CATH;  Surgeon: Rolm Bookbinder, MD;  Location: [place];  Service: General;  Laterality: Right;  . RADIOFREQUENCY ABLATION NERVES  05/12/2003   uterosacral nerve ablation  . REMOVAL OF BILATERAL TISSUE EXPANDERS WITH PLACEMENT OF BILATERAL BREAST IMPLANTS Bilateral 03/09/2014   Procedure: REMOVAL OF BILATERAL TISSUE EXPANDERS WITH PLACEMENT OF BILATERAL BREAST IMPLANTS;  Surgeon: Theodoro Kos, DO;  Location: [place];  Service: Plastics;  Laterality: Bilateral;  . TEE WITHOUT CARDIOVERSION N/A 06/04/2013   Procedure: TRANSESOPHAGEAL ECHOCARDIOGRAM (TEE);  Surgeon: Jolaine Artist, MD;  Location: [facility] ENDOSCOPY;  Service: Cardiovascular;  Laterality: N/A;  . TISSUE EXPANDER PLACEMENT Left 06/16/2013   Procedure: LEFT BREAST TISSUE EXPANDER REMOVAL;  Surgeon: Theodoro Kos, DO;  Location: [place];  Service: Plastics;  Laterality: Left;  . TISSUE EXPANDER PLACEMENT Left 09/29/2013   Procedure: TISSUE EXPANDER;  Surgeon: Theodoro Kos, DO;  Location: [place];  Service: Plastics;  Laterality: Left;  . TOTAL ABDOMINAL HYSTERECTOMY W/ BILATERAL SALPINGOOPHORECTOMY  03/28/2004  . TOTAL MASTECTOMY  12/31/2012   Procedure: TOTAL MASTECTOMY;  Surgeon: Rolm Bookbinder, MD;  Location: [place];  Service: General;  Laterality: Bilateral;  bilateral total mastectomies    There were no vitals filed for this visit.  Subjective Assessment - 04/07/18 0803    Subjective  Last night I was hurting after the trigger point release but I am a little better    Patient Stated Goals  to get better, be knowledgable of ergonomics.     Currently in Pain?  Yes    Pain Score  1     Pain Location  Hip    Pain Orientation  Right    Pain Descriptors / Indicators   Aching    Pain Type  Chronic pain    Pain Onset  More than a month ago    Pain Frequency  Intermittent         [facility] PT Assessment - 04/06/18 1423      Assessment   Medical Diagnosis  R hip pain    Referring Provider  McKeag, Marylynn Pearson md    Onset Date/Surgical Date  -- 2-3 months    Hand Dominance  Right    Next MD Visit  1 month    Prior Therapy  yes      Precautions   Precaution Comments  avoid running/ hopping/ clicking      Restrictions   Weight Bearing Restrictions  No      Balance Screen   Has the patient fallen in the past 6 months  No    Has the patient had a decrease in activity level because of a fear of falling?   No    Is the patient reluctant to leave their home because of a fear of falling?  No      Home Environment   Living Environment  Private residence    Living Arrangements  Spouse/significant other    Available Help at Discharge  Family;Available PRN/intermittently    Type of Home  House    Home Access  Stairs to enter    Entrance Stairs-Number of Steps  2    Entrance Stairs-Rails  Left ascending    Home Layout  One level      Prior Function   Level of Independence  Independent with basic ADLs    Vocation  Full time employment physician liasion    Leisure  golf, walking, going to the gym      Cognition   Overall Cognitive Status  Within Functional Limits for tasks assessed      Observation/Other Assessments   Focus on Therapeutic Outcomes (FOTO)   29% limited      Posture/Postural Control   Posture/Postural Control  Postural limitations    Postural Limitations  Rounded Shoulders;Forward head      ROM / Strength   AROM / PROM / Strength  AROM;Strength      AROM   AROM Assessment Site  Hip    Right/Left Hip  Right;Left    Right Hip Extension  10    Right Hip Flexion  85    Right Hip External Rotation   21    Right Hip Internal Rotation   40    Right Hip ABduction  30    Left Hip Extension  10    Left Hip Flexion  96    Left Hip  External Rotation   20    Left Hip Internal Rotation   41    Left Hip ABduction  48      Strength   Strength Assessment Site  Hip    Right/Left Hip  Right;Left    Right Hip Flexion  4+/5    Right Hip Extension  4/5    Right Hip External Rotation   4/5    Right Hip Internal Rotation  4/5    Right Hip ABduction  4-/5    Right Hip ADduction  4+/5    Left Hip Flexion  4+/5    Left Hip Extension  4/5    Left Hip External Rotation  4/5    Left Hip Internal Rotation  4/5    Left Hip ABduction  4-/5    Left Hip ADduction  4+/5      Palpation   Palpation comment  TTP along the greater trochanter, pectineus / longus, and glutee medius and piriformis tightness      Special Tests    Special Tests  Hip Special Tests    Hip Special Tests   Hip Scouring;Ober's Test;Thomas Test      Thomas Test    Findings  Positive    Side  Right      Ober's Test   Findings  Negative    Side  Right      Hip Scouring   Findings  Negative    Side  Right      Ambulation/Gait   Ambulation/Gait  Yes    Gait Pattern  Step-to pattern                   [facility] Adult PT Treatment/Exercise - 04/07/18 0809      Self-Care   Self-Care  Other Self-Care Comments    Other Self-Care Comments   education on TPDN aftercare and precautions  Knee/Hip Exercises: Stretches   ITB Stretch  30 seconds;1 rep in L sidelying reviewed    Other Knee/Hip Stretches  thomas test on table right side 30 sec x 2      Knee/Hip Exercises: Standing   Side Lunges  2 sets 30 seconds    Other Standing Knee Exercises  hip abduction with red t band x 10      Manual Therapy   Manual Therapy  Soft tissue mobilization    Manual therapy comments  prone IR/ER actively with trigger point release  skilled palpation with TPDN    Soft tissue mobilization  right gluteals, piriformis, adductor , lateral quad and lateral hamstring        Trigger Point Dry Needling - 04/07/18 0811    Consent Given?  Yes    Education Handout  Provided  Yes    Muscles Treated Lower Body  Gluteus minimus;Gluteus maximus;Piriformis;Quadriceps;Adductor longus/brevius/maximus;Hamstring right only    Hamstring Response  Twitch response elicited;Palpable increased muscle length lateral           PT Education - 04/07/18 0805    Education provided  Yes    Education Details  reviewed HEP, educated on TPDN after care, precautians,    Person(s) Educated  Patient    Methods  Explanation;Demonstration;Verbal cues    Comprehension  Verbalized understanding;Returned demonstration       PT Short Term Goals - 04/07/18 0900      PT SHORT TERM GOAL #1   Title  pt to be I with inital HEP    Baseline  Pt given initial stretch program and reviewed    Time  3    Period  Weeks    Status  On-going      PT SHORT TERM GOAL #2   Title  pt to verbalize/ demo proper posture in multiple positions and proper lifting mechanics to prevent and reduce R hip pain    Baseline  Iniital education begun    Time  3    Period  Weeks    Status  On-going        PT Long Term Goals - 04/06/18 1520      PT LONG TERM GOAL #1   Title  pt to increase bil hip abductor/ extensor strength to >/= 4+/5 with no pain during testing for hip stability     Time  6    Period  Weeks    Status  New    Target Date  05/18/18      PT LONG TERM GOAL #2   Title  increase hip abduction ROM to demo reduced adductor tightness with </=1/10 pain     Time  6    Period  Weeks    Status  New    Target Date  05/18/18      PT LONG TERM GOAL #3   Title  pt to be able to sit/ stand and walk >/= 60 min with </= 1/10 pain and no report of catching or popping    Time  6    Period  Weeks    Status  New    Target Date  05/18/18      PT LONG TERM GOAL #4   Title  increase FOTO score to </=25% limited to demo improvement in function    Time  6    Period  Weeks    Status  New    Target Date  05/18/18      PT LONG TERM  GOAL #5   Title  pt to be I with all HEP given as of last  visit to maintain and progress current level of functoin     Time  6    Period  Weeks    Status  New    Target Date  05/18/18            Plan - 04/07/18 0900    Clinical Impression Statement  Pt returns for 2nd clinic visit and requests TPDN.  Pt consents to TPDN and was closely monitored for all sites.  Pt with multiple Localized twitch responses and decrease in sharp pain of hip post RX.  Pt will continue with strengthening of core and hips.    Rehab Potential  Good    PT Frequency  2x / week    PT Duration  6 weeks    PT Treatment/Interventions  ADLs/Self Care Home Management;Cryotherapy;Gait training;Stair training;Therapeutic activities;Therapeutic exercise;Manual techniques;Taping;Dry needling;Patient/family education;Passive range of motion;Balance training    PT Next Visit Plan  (hx of cx no heat/e-stim), Review/ update HEP PRN,  assess DN last visit for glute med/ pirifomris and adductors, hamstrings. , hip strenthening, posture/ ergonomics,     PT Home Exercise Plan  ITB stretching (in L sidelying), standing hip abduction, adductor stretching, thomas stretching.     Consulted and Agree with Plan of Care  Patient       Patient will benefit from skilled therapeutic intervention in order to improve the following deficits and impairments:  Pain, Improper body mechanics, Postural dysfunction, Decreased strength, Decreased range of motion, Decreased endurance, Decreased activity tolerance, Decreased balance  Visit Diagnosis: Pain in right hip  Muscle weakness (generalized)  Stiffness of right hip, not elsewhere classified     Problem List Patient Active Problem List   Diagnosis Date Noted  . Dyspnea 09/11/2015  . Breast cancer of upper-outer quadrant of left female breast ([place]) 06/28/2015  . Right hip pain 09/19/2014  . Right knee pain 09/19/2014  . Dyspnea on exertion 08/08/2014  . Heart palpitations 08/08/2014  . Acquired absence of breast and absent nipple  03/09/2014  . Pre-operative cardiovascular examination 09/09/2013  . Other chronic pulmonary heart diseases 05/27/2013  . SIRS (systemic inflammatory response syndrome) ([place]) 04/14/2013  . Anemia 04/14/2013  . Dehydration 04/14/2013  . Fever 04/14/2013  . Allergy   . IBS (irritable bowel syndrome)   . Thyroid disease   . ADD (attention deficit disorder)   . Lyme disease   . Asthma   . Hypothyroidism   . Contact lens/glasses fitting     Voncille Lo, PT Certified Exercise Expert for the Aging Adult  04/07/18 9:10 AM Phone: [phone] Fax: [place] [facility] [address] [place] [address], [place], [address] Phone: [phone]   Fax:  [phone]  Name: Jane Gutierrez MRN: [id number] Date of Birth: 1956-01-21

## 2018-04-07 NOTE — Patient Instructions (Addendum)
Trigger Point Dry Needling  . What is Trigger Point Dry Needling (DN)? o DN is a physical therapy technique used to treat muscle pain and dysfunction. Specifically, DN helps deactivate muscle trigger points (muscle knots).  o A thin filiform needle is used to penetrate the skin and stimulate the underlying trigger point. The goal is for a local twitch response (LTR) to occur and for the trigger point to relax. No medication of any kind is injected during the procedure.   . What Does Trigger Point Dry Needling Feel Like?  o The procedure feels different for each individual patient. Some patients report that they do not actually feel the needle enter the skin and overall the process is not painful. Very mild bleeding may occur. However, many patients feel a deep cramping in the muscle in which the needle was inserted. This is the local twitch response.   [name] How Will I feel after the treatment? o Soreness is normal, and the onset of soreness may not occur for a few hours. Typically this soreness does not last longer than two days.  o Bruising is uncommon, however; ice can be used to decrease any possible bruising.  o In rare cases feeling tired or nauseous after the treatment is normal. In addition, your symptoms may get worse before they get better, this period will typically not last longer than 24 hours.   . What Can I do After My Treatment? o Increase your hydration by drinking more water for the next 24 hours. o You may place ice or heat on the areas treated that have become sore, however, do not use heat on inflamed or bruised areas. Heat often brings more relief post needling. o You can continue your regular activities, but vigorous activity is not recommended initially after the treatment for 24 hours. o DN is best combined with other physical therapy such as strengthening, stretching, and other therapies.   Voncille Lo, PT Certified Exercise Expert for the Aging Adult  04/07/18 8:05  AM Phone: [phone] Fax: [phone]

## 2018-04-08 DIAGNOSIS — Z23 Encounter for immunization: Secondary | ICD-10-CM | POA: Diagnosis not present

## 2018-04-16 ENCOUNTER — Ambulatory Visit: Payer: 59 | Admitting: Physical Therapy

## 2018-04-16 ENCOUNTER — Encounter: Payer: Self-pay | Admitting: Physical Therapy

## 2018-04-16 DIAGNOSIS — M25651 Stiffness of right hip, not elsewhere classified: Secondary | ICD-10-CM

## 2018-04-16 DIAGNOSIS — M25551 Pain in right hip: Secondary | ICD-10-CM | POA: Diagnosis not present

## 2018-04-16 DIAGNOSIS — M6281 Muscle weakness (generalized): Secondary | ICD-10-CM | POA: Diagnosis not present

## 2018-04-16 NOTE — Therapy (Signed)
[place], [place], [address] Phone: [phone]   Fax:  [phone]  Physical Therapy Treatment  Patient Details  Name: Jane Gutierrez MRN: [id number] Date of Birth: 03/01/1956 Referring Provider: Elberta Leatherwood md   Encounter Date: 04/16/2018  PT End of Session - 04/16/18 1451    Visit Number  3    Number of Visits  13    Date for PT Re-Evaluation  05/18/18    Authorization Type  [facility]    PT Start Time  1410    PT Stop Time  1450    PT Time Calculation (min)  40 min    Activity Tolerance  Patient tolerated treatment well    Behavior During Therapy  [facility] for tasks assessed/performed       Past Medical History:  Diagnosis Date  . Abnormally small mouth   . ADD (attention deficit disorder)   . ADHD   . Allergy   . Anxiety    pt reported she doesn't have anxiety  . Asthma    prn inhaler  . Blood transfusion without reported diagnosis   . Breast cancer ([place])   . Dental crowns present   . Difficulty swallowing pills   . History of breast cancer   . History of cervical cancer   . History of palpitations    last echo 08/22/2014  . Hypothyroidism   . IBS (irritable bowel syndrome)     Past Surgical History:  Procedure Laterality Date  . ABDOMINAL HYSTERECTOMY    . ABDOMINOPLASTY  03/28/2004  . AXILLARY SENTINEL NODE BIOPSY  12/31/2012   Procedure: AXILLARY SENTINEL NODE BIOPSY;  Surgeon: Rolm Bookbinder, MD;  Location: [place];  Service: General;  Laterality: Bilateral;  bilateral sentinel node  . BRAIN SURGERY     removal benign frontal lobe cyst in 1987  . BREAST ENHANCEMENT SURGERY Bilateral ~ 2008  . BREAST IMPLANT EXCHANGE Bilateral 12/11/2017   Procedure: BILATERAL IMPLANT EXCHANGE;  Surgeon: Wallace Going, DO;  Location: [place];  Service: Plastics;  Laterality: Bilateral;  . BREAST RECONSTRUCTION WITH PLACEMENT OF TISSUE EXPANDER AND FLEX HD  (ACELLULAR HYDRATED DERMIS)  12/31/2012   Procedure: BREAST RECONSTRUCTION WITH PLACEMENT OF TISSUE EXPANDER AND FLEX HD (ACELLULAR HYDRATED DERMIS);  Surgeon: Theodoro Kos, DO;  Location: [place];  Service: Plastics;  Laterality: Bilateral;  bilateral immediate breast reconstruction with expanders and flex hd   . CAPSULOTOMY Left 09/01/2014   Procedure: CAPSULOTOMY OF LEFT BREAST WITH LIPOFILLING FOR SYMMETRY;  Surgeon: Theodoro Kos, DO;  Location: [place];  Service: Plastics;  Laterality: Left;  . CERVICAL CERCLAGE    . DIAGNOSTIC LAPAROSCOPY  05/12/2003   right partial salpingectomy; lysis paraovarian adhesions left  . DILATION AND CURETTAGE OF UTERUS  2005  . ENDOMETRIAL FULGURATION  05/12/2003  . FACIAL COSMETIC SURGERY  ~ 2010  . FOOT SURGERY Bilateral 07/2012  . GYNECOLOGIC CRYOSURGERY  1998  . INCISION AND DRAINAGE OF WOUND Left 04/28/2013   Procedure: IRRIGATION AND DEBRIDEMENT LEFT BREAST WOUND, POSSIBLE CLOSURE OF WOUND WITH DRAIN PLACEMENT;  Surgeon: Theodoro Kos, DO;  Location: [place];  Service: Plastics;  Laterality: Left;  . LATISSIMUS FLAP TO BREAST Left 09/29/2013   Procedure: LATISSIMUS FLAP TO BREAST WITH TISSUE EXPANDER PLACEMENT FOR LEFT BREAST RECONSTRUCTION;  Surgeon: Theodoro Kos, DO;  Location: [place];  Service: Plastics;  Laterality: Left;  . LIPECTOMY Bilateral 03/28/2004   hip  .  LIPOSUCTION Bilateral 03/09/2014   Procedure: LIPOSUCTION ABDOMEN W/ LIPOFILLING LATERAL BILATERAL BREAST;  Surgeon: Theodoro Kos, DO;  Location: [place];  Service: Plastics;  Laterality: Bilateral;  LIPOSUCTION NECK  . LIPOSUCTION WITH LIPOFILLING Bilateral 09/01/2014   Procedure: LIPOSUCTION WITH LIPOFILLING;  Surgeon: Theodoro Kos, DO;  Location: [place];  Service: Plastics;  Laterality: Bilateral;  . LYSIS OF ADHESION  03/28/2004   pelvic  . MASTECTOMY    . PORT-A-CATH REMOVAL Right 03/09/2014    Procedure: REMOVAL PORT-A-CATH;  Surgeon: Theodoro Kos, DO;  Location: [place];  Service: Plastics;  Laterality: Right;  . PORTACATH PLACEMENT  01/27/2013   Procedure: INSERTION PORT-A-CATH;  Surgeon: Rolm Bookbinder, MD;  Location: [place];  Service: General;  Laterality: Right;  . RADIOFREQUENCY ABLATION NERVES  05/12/2003   uterosacral nerve ablation  . REMOVAL OF BILATERAL TISSUE EXPANDERS WITH PLACEMENT OF BILATERAL BREAST IMPLANTS Bilateral 03/09/2014   Procedure: REMOVAL OF BILATERAL TISSUE EXPANDERS WITH PLACEMENT OF BILATERAL BREAST IMPLANTS;  Surgeon: Theodoro Kos, DO;  Location: [place];  Service: Plastics;  Laterality: Bilateral;  . TEE WITHOUT CARDIOVERSION N/A 06/04/2013   Procedure: TRANSESOPHAGEAL ECHOCARDIOGRAM (TEE);  Surgeon: Jolaine Artist, MD;  Location: [facility] ENDOSCOPY;  Service: Cardiovascular;  Laterality: N/A;  . TISSUE EXPANDER PLACEMENT Left 06/16/2013   Procedure: LEFT BREAST TISSUE EXPANDER REMOVAL;  Surgeon: Theodoro Kos, DO;  Location: [place];  Service: Plastics;  Laterality: Left;  . TISSUE EXPANDER PLACEMENT Left 09/29/2013   Procedure: TISSUE EXPANDER;  Surgeon: Theodoro Kos, DO;  Location: [place];  Service: Plastics;  Laterality: Left;  . TOTAL ABDOMINAL HYSTERECTOMY W/ BILATERAL SALPINGOOPHORECTOMY  03/28/2004  . TOTAL MASTECTOMY  12/31/2012   Procedure: TOTAL MASTECTOMY;  Surgeon: Rolm Bookbinder, MD;  Location: [place];  Service: General;  Laterality: Bilateral;  bilateral total mastectomies    There were no vitals filed for this visit.  Subjective Assessment - 04/16/18 1409    Subjective  "I feel like I am doing better, i was really sore after doing a lot of picking up the sticks and had signifcant soreness in the front of thigh"    Currently in Pain?  Yes    Pain Score  2  last took ibuprofen and hour ago    Aggravating Factors   elliptical, squatting, sitting, hip  ER                       [facility] Adult PT Treatment/Exercise - 04/16/18 1454      Knee/Hip Exercises: Stretches   Quad Stretch  3 reps;30 seconds;Right in prone    Hip Flexor Stretch  3 reps;30 seconds 2 x with External rotation to hip pectineus      Knee/Hip Exercises: Seated   Hamstring Curl  2 sets;15 reps;Right;Strengthening    Hamstring Limitations  with green theraband      Manual Therapy   Manual Therapy  Joint mobilization    Manual therapy comments  skilled palpation and monitoring of pt throughout TPDN    Joint Mobilization  AP grade 4     Soft tissue mobilization  IASTM for the glute med       Trigger Point Dry Needling - 04/16/18 1423    Consent Given?  Yes    Education Handout Provided  No given prolonged    Muscles Treated Lower Body  Piriformis;Tensor fascia lata    Piriformis Response  Twitch response elicited;Palpable  increased muscle length    Tensor Fascia Lata Response  Twitch response elicited;Palpable increased muscle length             PT Short Term Goals - 04/07/18 0900      PT SHORT TERM GOAL #1   Title  pt to be I with inital HEP    Baseline  Pt given initial stretch program and reviewed    Time  3    Period  Weeks    Status  On-going      PT SHORT TERM GOAL #2   Title  pt to verbalize/ demo proper posture in multiple positions and proper lifting mechanics to prevent and reduce R hip pain    Baseline  Iniital education begun    Time  3    Period  Weeks    Status  On-going        PT Long Term Goals - 04/06/18 1520      PT LONG TERM GOAL #1   Title  pt to increase bil hip abductor/ extensor strength to >/= 4+/5 with no pain during testing for hip stability     Time  6    Period  Weeks    Status  New    Target Date  05/18/18      PT LONG TERM GOAL #2   Title  increase hip abduction ROM to demo reduced adductor tightness with </=1/10 pain     Time  6    Period  Weeks    Status  New    Target Date  05/18/18       PT LONG TERM GOAL #3   Title  pt to be able to sit/ stand and walk >/= 60 min with </= 1/10 pain and no report of catching or popping    Time  6    Period  Weeks    Status  New    Target Date  05/18/18      PT LONG TERM GOAL #4   Title  increase FOTO score to </=25% limited to demo improvement in function    Time  6    Period  Weeks    Status  New    Target Date  05/18/18      PT LONG TERM GOAL #5   Title  pt to be I with all HEP given as of last visit to maintain and progress current level of functoin     Time  6    Period  Weeks    Status  New    Target Date  05/18/18            Plan - 04/16/18 1452    Clinical Impression Statement  pt reports limited pain today but has increased tightness in the quads due to doing more than she is used to. Continued TPDN for the glute med/ min and piriformis followed with IASTM techniques and stretching. added hamstring strengthening and hip flexor stretching to HEP. end of session she reported no pain.     Rehab Potential  Good    PT Treatment/Interventions  ADLs/Self Care Home Management;Cryotherapy;Gait training;Stair training;Therapeutic activities;Therapeutic exercise;Manual techniques;Taping;Dry needling;Patient/family education;Passive range of motion;Balance training    PT Next Visit Plan  (hx of cx no heat/e-stim), Review/ update HEP PRN,  assess DN last visit for glute med/ pirifomris and adductors, hamstrings. , hip strenthening, posture/ ergonomics,     PT Home Exercise Plan  ITB stretching (in L sidelying), standing hip abduction, adductor  stretching, thomas stretching. hip flexor stretching and hamstring strengthening.     Consulted and Agree with Plan of Care  Patient       Patient will benefit from skilled therapeutic intervention in order to improve the following deficits and impairments:  Pain, Improper body mechanics, Postural dysfunction, Decreased strength, Decreased range of motion, Decreased endurance, Decreased  activity tolerance, Decreased balance  Visit Diagnosis: Pain in right hip  Muscle weakness (generalized)  Stiffness of right hip, not elsewhere classified     Problem List Patient Active Problem List   Diagnosis Date Noted  . Dyspnea 09/11/2015  . Breast cancer of upper-outer quadrant of left female breast ([place]) 06/28/2015  . Right hip pain 09/19/2014  . Right knee pain 09/19/2014  . Dyspnea on exertion 08/08/2014  . Heart palpitations 08/08/2014  . Acquired absence of breast and absent nipple 03/09/2014  . Pre-operative cardiovascular examination 09/09/2013  . Other chronic pulmonary heart diseases 05/27/2013  . SIRS (systemic inflammatory response syndrome) ([place]) 04/14/2013  . Anemia 04/14/2013  . Dehydration 04/14/2013  . Fever 04/14/2013  . Allergy   . IBS (irritable bowel syndrome)   . Thyroid disease   . ADD (attention deficit disorder)   . Lyme disease   . Asthma   . Hypothyroidism   . Contact lens/glasses fitting    Starr Lake PT, DPT, LAT, ATC  04/16/18  2:57 PM      [facility] [address] [place], [place], [address] Phone: [phone]   Fax:  [phone]  Name: Jane Gutierrez MRN: [id number] Date of Birth: 10-Jan-1956

## 2018-04-21 ENCOUNTER — Encounter: Payer: Self-pay | Admitting: Physical Therapy

## 2018-04-21 ENCOUNTER — Ambulatory Visit: Payer: 59 | Admitting: Physical Therapy

## 2018-04-21 DIAGNOSIS — M25651 Stiffness of right hip, not elsewhere classified: Secondary | ICD-10-CM

## 2018-04-21 DIAGNOSIS — M6281 Muscle weakness (generalized): Secondary | ICD-10-CM | POA: Diagnosis not present

## 2018-04-21 DIAGNOSIS — M25551 Pain in right hip: Secondary | ICD-10-CM | POA: Diagnosis not present

## 2018-04-21 NOTE — Therapy (Signed)
[place], [place], [address] Phone: [phone]   Fax:  [phone]  Physical Therapy Treatment  Patient Details  Name: Jane Gutierrez MRN: [id number] Date of Birth: 10-Jul-1956 Referring Provider: Elberta Leatherwood md   Encounter Date: 04/21/2018  PT End of Session - 04/21/18 1502    Visit Number  4    Number of Visits  13    Date for PT Re-Evaluation  05/18/18    PT Start Time  1502    PT Stop Time  1542    PT Time Calculation (min)  40 min    Activity Tolerance  Patient tolerated treatment well    Behavior During Therapy  [facility] for tasks assessed/performed       Past Medical History:  Diagnosis Date  . Abnormally small mouth   . ADD (attention deficit disorder)   . ADHD   . Allergy   . Anxiety    pt reported she doesn't have anxiety  . Asthma    prn inhaler  . Blood transfusion without reported diagnosis   . Breast cancer ([place])   . Dental crowns present   . Difficulty swallowing pills   . History of breast cancer   . History of cervical cancer   . History of palpitations    last echo 08/22/2014  . Hypothyroidism   . IBS (irritable bowel syndrome)     Past Surgical History:  Procedure Laterality Date  . ABDOMINAL HYSTERECTOMY    . ABDOMINOPLASTY  03/28/2004  . AXILLARY SENTINEL NODE BIOPSY  12/31/2012   Procedure: AXILLARY SENTINEL NODE BIOPSY;  Surgeon: Rolm Bookbinder, MD;  Location: [place];  Service: General;  Laterality: Bilateral;  bilateral sentinel node  . BRAIN SURGERY     removal benign frontal lobe cyst in 1987  . BREAST ENHANCEMENT SURGERY Bilateral ~ 2008  . BREAST IMPLANT EXCHANGE Bilateral 12/11/2017   Procedure: BILATERAL IMPLANT EXCHANGE;  Surgeon: Wallace Going, DO;  Location: [place];  Service: Plastics;  Laterality: Bilateral;  . BREAST RECONSTRUCTION WITH PLACEMENT OF TISSUE EXPANDER AND FLEX HD (ACELLULAR HYDRATED DERMIS)   12/31/2012   Procedure: BREAST RECONSTRUCTION WITH PLACEMENT OF TISSUE EXPANDER AND FLEX HD (ACELLULAR HYDRATED DERMIS);  Surgeon: Theodoro Kos, DO;  Location: [place];  Service: Plastics;  Laterality: Bilateral;  bilateral immediate breast reconstruction with expanders and flex hd   . CAPSULOTOMY Left 09/01/2014   Procedure: CAPSULOTOMY OF LEFT BREAST WITH LIPOFILLING FOR SYMMETRY;  Surgeon: Theodoro Kos, DO;  Location: [place];  Service: Plastics;  Laterality: Left;  . CERVICAL CERCLAGE    . DIAGNOSTIC LAPAROSCOPY  05/12/2003   right partial salpingectomy; lysis paraovarian adhesions left  . DILATION AND CURETTAGE OF UTERUS  2005  . ENDOMETRIAL FULGURATION  05/12/2003  . FACIAL COSMETIC SURGERY  ~ 2010  . FOOT SURGERY Bilateral 07/2012  . GYNECOLOGIC CRYOSURGERY  1998  . INCISION AND DRAINAGE OF WOUND Left 04/28/2013   Procedure: IRRIGATION AND DEBRIDEMENT LEFT BREAST WOUND, POSSIBLE CLOSURE OF WOUND WITH DRAIN PLACEMENT;  Surgeon: Theodoro Kos, DO;  Location: [place];  Service: Plastics;  Laterality: Left;  . LATISSIMUS FLAP TO BREAST Left 09/29/2013   Procedure: LATISSIMUS FLAP TO BREAST WITH TISSUE EXPANDER PLACEMENT FOR LEFT BREAST RECONSTRUCTION;  Surgeon: Theodoro Kos, DO;  Location: [place];  Service: Plastics;  Laterality: Left;  . LIPECTOMY Bilateral 03/28/2004   hip  . LIPOSUCTION Bilateral 03/09/2014  Procedure: LIPOSUCTION ABDOMEN W/ LIPOFILLING LATERAL BILATERAL BREAST;  Surgeon: Theodoro Kos, DO;  Location: [place];  Service: Plastics;  Laterality: Bilateral;  LIPOSUCTION NECK  . LIPOSUCTION WITH LIPOFILLING Bilateral 09/01/2014   Procedure: LIPOSUCTION WITH LIPOFILLING;  Surgeon: Theodoro Kos, DO;  Location: [place];  Service: Plastics;  Laterality: Bilateral;  . LYSIS OF ADHESION  03/28/2004   pelvic  . MASTECTOMY    . PORT-A-CATH REMOVAL Right 03/09/2014   Procedure: REMOVAL PORT-A-CATH;   Surgeon: Theodoro Kos, DO;  Location: [place];  Service: Plastics;  Laterality: Right;  . PORTACATH PLACEMENT  01/27/2013   Procedure: INSERTION PORT-A-CATH;  Surgeon: Rolm Bookbinder, MD;  Location: [place];  Service: General;  Laterality: Right;  . RADIOFREQUENCY ABLATION NERVES  05/12/2003   uterosacral nerve ablation  . REMOVAL OF BILATERAL TISSUE EXPANDERS WITH PLACEMENT OF BILATERAL BREAST IMPLANTS Bilateral 03/09/2014   Procedure: REMOVAL OF BILATERAL TISSUE EXPANDERS WITH PLACEMENT OF BILATERAL BREAST IMPLANTS;  Surgeon: Theodoro Kos, DO;  Location: [place];  Service: Plastics;  Laterality: Bilateral;  . TEE WITHOUT CARDIOVERSION N/A 06/04/2013   Procedure: TRANSESOPHAGEAL ECHOCARDIOGRAM (TEE);  Surgeon: Jolaine Artist, MD;  Location: [facility] ENDOSCOPY;  Service: Cardiovascular;  Laterality: N/A;  . TISSUE EXPANDER PLACEMENT Left 06/16/2013   Procedure: LEFT BREAST TISSUE EXPANDER REMOVAL;  Surgeon: Theodoro Kos, DO;  Location: [place];  Service: Plastics;  Laterality: Left;  . TISSUE EXPANDER PLACEMENT Left 09/29/2013   Procedure: TISSUE EXPANDER;  Surgeon: Theodoro Kos, DO;  Location: [place];  Service: Plastics;  Laterality: Left;  . TOTAL ABDOMINAL HYSTERECTOMY W/ BILATERAL SALPINGOOPHORECTOMY  03/28/2004  . TOTAL MASTECTOMY  12/31/2012   Procedure: TOTAL MASTECTOMY;  Surgeon: Rolm Bookbinder, MD;  Location: [place];  Service: General;  Laterality: Bilateral;  bilateral total mastectomies    There were no vitals filed for this visit.  Subjective Assessment - 04/21/18 1502    Subjective  "I rode after the last session, I wasn't sore after riding, I did again on Sunday and had no issues. I've gone to the gym. I noticed I have only trouble pivoting to the L while standing on my R"    Currently in Pain?  No/denies    Pain Location  Hip    Pain Orientation  Right    Pain Descriptors / Indicators   Aching    Pain Type  Chronic pain    Pain Onset  More than a month ago    Pain Frequency  Intermittent                       [facility] Adult PT Treatment/Exercise - 04/21/18 1519      Knee/Hip Exercises: Stretches   Hip Flexor Stretch  3 reps;30 seconds with hip ER for pectineus stretch      Knee/Hip Exercises: Standing   Other Standing Knee Exercises  Warrior yoga pose 2 x 2 min      Knee/Hip Exercises: Supine   Bridges  2 sets;10 reps with heels on physioball    Bridges with Clamshell  2 sets;10 reps;Both;Strengthening with green theraband      Manual Therapy   Manual Therapy  Other (comment)    Manual therapy comments  MTPR along pectinues    Joint Mobilization  PA grade 4 hip mob while laying on tennis ball on pectineus    Soft tissue mobilization  IASTM for the glute med/piriformis  Other Manual Therapy  tack and stretch of the piriformis               PT Short Term Goals - 04/07/18 0900      PT SHORT TERM GOAL #1   Title  pt to be I with inital HEP    Baseline  Pt given initial stretch program and reviewed    Time  3    Period  Weeks    Status  On-going      PT SHORT TERM GOAL #2   Title  pt to verbalize/ demo proper posture in multiple positions and proper lifting mechanics to prevent and reduce R hip pain    Baseline  Iniital education begun    Time  3    Period  Weeks    Status  On-going        PT Long Term Goals - 04/06/18 1520      PT LONG TERM GOAL #1   Title  pt to increase bil hip abductor/ extensor strength to >/= 4+/5 with no pain during testing for hip stability     Time  6    Period  Weeks    Status  New    Target Date  05/18/18      PT LONG TERM GOAL #2   Title  increase hip abduction ROM to demo reduced adductor tightness with </=1/10 pain     Time  6    Period  Weeks    Status  New    Target Date  05/18/18      PT LONG TERM GOAL #3   Title  pt to be able to sit/ stand and walk >/= 60 min with </= 1/10 pain and  no report of catching or popping    Time  6    Period  Weeks    Status  New    Target Date  05/18/18      PT LONG TERM GOAL #4   Title  increase FOTO score to </=25% limited to demo improvement in function    Time  6    Period  Weeks    Status  New    Target Date  05/18/18      PT LONG TERM GOAL #5   Title  pt to be I with all HEP given as of last visit to maintain and progress current level of functoin     Time  6    Period  Weeks    Status  New    Target Date  05/18/18            Plan - 04/21/18 1537    Clinical Impression Statement  Mrs. Guerrieri reports continued improvement with most of the tightness along the pectineus on the R and worse with quick movements. opted to hold off on DN and focus on MTPR along the pectineus on the R. followed with hip mobs and hamstring activation. end of session she reported no pain.     PT Next Visit Plan  (hx of cx no heat/e-stim), update HEP PRN,  assess DN last visit for glute med/ pirifomris and adductors, hamstrings. , hip strenthening, posture/ ergonomics,     PT Home Exercise Plan  ITB stretching (in L sidelying), standing hip abduction, adductor stretching, thomas stretching. hip flexor stretching and hamstring strengthening.     Consulted and Agree with Plan of Care  Patient       Patient will benefit from skilled therapeutic intervention  in order to improve the following deficits and impairments:     Visit Diagnosis: Pain in right hip  Muscle weakness (generalized)  Stiffness of right hip, not elsewhere classified     Problem List Patient Active Problem List   Diagnosis Date Noted  . Dyspnea 09/11/2015  . Breast cancer of upper-outer quadrant of left female breast ([place]) 06/28/2015  . Right hip pain 09/19/2014  . Right knee pain 09/19/2014  . Dyspnea on exertion 08/08/2014  . Heart palpitations 08/08/2014  . Acquired absence of breast and absent nipple 03/09/2014  . Pre-operative cardiovascular examination  09/09/2013  . Other chronic pulmonary heart diseases 05/27/2013  . SIRS (systemic inflammatory response syndrome) ([place]) 04/14/2013  . Anemia 04/14/2013  . Dehydration 04/14/2013  . Fever 04/14/2013  . Allergy   . IBS (irritable bowel syndrome)   . Thyroid disease   . ADD (attention deficit disorder)   . Lyme disease   . Asthma   . Hypothyroidism   . Contact lens/glasses fitting    Starr Lake PT, DPT, LAT, ATC  04/21/18  3:44 PM      [facility] [address] [place], [place], [address] Phone: [phone]   Fax:  [phone]  Name: Jane Gutierrez MRN: [id number] Date of Birth: [birth year]

## 2018-04-23 ENCOUNTER — Encounter: Payer: Self-pay | Admitting: Physical Therapy

## 2018-04-23 ENCOUNTER — Ambulatory Visit: Payer: 59 | Attending: Family Medicine | Admitting: Physical Therapy

## 2018-04-23 DIAGNOSIS — M25551 Pain in right hip: Secondary | ICD-10-CM | POA: Diagnosis not present

## 2018-04-23 DIAGNOSIS — M6281 Muscle weakness (generalized): Secondary | ICD-10-CM | POA: Diagnosis not present

## 2018-04-23 DIAGNOSIS — M25651 Stiffness of right hip, not elsewhere classified: Secondary | ICD-10-CM | POA: Diagnosis not present
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
## 2018-04-28 ENCOUNTER — Ambulatory Visit: Payer: 59 | Admitting: Physical Therapy

## 2018-04-28 ENCOUNTER — Encounter: Payer: Self-pay | Admitting: Physical Therapy

## 2018-04-28 DIAGNOSIS — M25551 Pain in right hip: Secondary | ICD-10-CM

## 2018-04-28 DIAGNOSIS — M6281 Muscle weakness (generalized): Secondary | ICD-10-CM | POA: Diagnosis not present

## 2018-04-28 DIAGNOSIS — M25651 Stiffness of right hip, not elsewhere classified: Secondary | ICD-10-CM

## 2018-04-28 NOTE — Therapy (Signed)
[place], [place], [address] Phone: [phone]   Fax:  [phone]  Physical Therapy Treatment  Patient Details  Name: ZOII FLORER MRN: [id number] Date of Birth: 12-23-1956 Referring Provider: Elberta Leatherwood md   Encounter Date: 04/28/2018  PT End of Session - 04/28/18 1414    Visit Number  6    Number of Visits  13    Date for PT Re-Evaluation  05/18/18    Authorization Type  UMR    PT Start Time  1415    PT Stop Time  1458    PT Time Calculation (min)  43 min    Activity Tolerance  Patient tolerated treatment well    Behavior During Therapy  [facility] for tasks assessed/performed       Past Medical History:  Diagnosis Date  . Abnormally small mouth   . ADD (attention deficit disorder)   . ADHD   . Allergy   . Anxiety    pt reported she doesn't have anxiety  . Asthma    prn inhaler  . Blood transfusion without reported diagnosis   . Breast cancer ([place])   . Dental crowns present   . Difficulty swallowing pills   . History of breast cancer   . History of cervical cancer   . History of palpitations    last echo 08/22/2014  . Hypothyroidism   . IBS (irritable bowel syndrome)     Past Surgical History:  Procedure Laterality Date  . ABDOMINAL HYSTERECTOMY    . ABDOMINOPLASTY  03/28/2004  . AXILLARY SENTINEL NODE BIOPSY  12/31/2012   Procedure: AXILLARY SENTINEL NODE BIOPSY;  Surgeon: [place] Bookbinder, MD;  Location: [place];  Service: General;  Laterality: Bilateral;  bilateral sentinel node  . BRAIN SURGERY     removal benign frontal lobe cyst in 1987  . BREAST ENHANCEMENT SURGERY Bilateral ~ 2008  . BREAST IMPLANT EXCHANGE Bilateral 12/11/2017   Procedure: BILATERAL IMPLANT EXCHANGE;  Surgeon: Wallace Going, DO;  Location: [place];  Service: Plastics;  Laterality: Bilateral;  . BREAST RECONSTRUCTION WITH PLACEMENT OF TISSUE EXPANDER AND FLEX HD  (ACELLULAR HYDRATED DERMIS)  12/31/2012   Procedure: BREAST RECONSTRUCTION WITH PLACEMENT OF TISSUE EXPANDER AND FLEX HD (ACELLULAR HYDRATED DERMIS);  Surgeon: Theodoro Kos, DO;  Location: [place];  Service: Plastics;  Laterality: Bilateral;  bilateral immediate breast reconstruction with expanders and flex hd   . CAPSULOTOMY Left 09/01/2014   Procedure: CAPSULOTOMY OF LEFT BREAST WITH LIPOFILLING FOR SYMMETRY;  Surgeon: Theodoro Kos, DO;  Location: [place];  Service: Plastics;  Laterality: Left;  . CERVICAL CERCLAGE    . DIAGNOSTIC LAPAROSCOPY  05/12/2003   right partial salpingectomy; lysis paraovarian adhesions left  . DILATION AND CURETTAGE OF UTERUS  2005  . ENDOMETRIAL FULGURATION  05/12/2003  . FACIAL COSMETIC SURGERY  ~ 2010  . FOOT SURGERY Bilateral 07/2012  . GYNECOLOGIC CRYOSURGERY  1998  . INCISION AND DRAINAGE OF WOUND Left 04/28/2013   Procedure: IRRIGATION AND DEBRIDEMENT LEFT BREAST WOUND, POSSIBLE CLOSURE OF WOUND WITH DRAIN PLACEMENT;  Surgeon: Theodoro Kos, DO;  Location: [place];  Service: Plastics;  Laterality: Left;  . LATISSIMUS FLAP TO BREAST Left 09/29/2013   Procedure: LATISSIMUS FLAP TO BREAST WITH TISSUE EXPANDER PLACEMENT FOR LEFT BREAST RECONSTRUCTION;  Surgeon: Theodoro Kos, DO;  Location: [place];  Service: Plastics;  Laterality: Left;  . LIPECTOMY Bilateral 03/28/2004   hip  .  LIPOSUCTION Bilateral 03/09/2014   Procedure: LIPOSUCTION ABDOMEN W/ LIPOFILLING LATERAL BILATERAL BREAST;  Surgeon: Theodoro Kos, DO;  Location: [place];  Service: Plastics;  Laterality: Bilateral;  LIPOSUCTION NECK  . LIPOSUCTION WITH LIPOFILLING Bilateral 09/01/2014   Procedure: LIPOSUCTION WITH LIPOFILLING;  Surgeon: Theodoro Kos, DO;  Location: [place];  Service: Plastics;  Laterality: Bilateral;  . LYSIS OF ADHESION  03/28/2004   pelvic  . MASTECTOMY    . PORT-A-CATH REMOVAL Right 03/09/2014    Procedure: REMOVAL PORT-A-CATH;  Surgeon: Theodoro Kos, DO;  Location: [place];  Service: Plastics;  Laterality: Right;  . PORTACATH PLACEMENT  01/27/2013   Procedure: INSERTION PORT-A-CATH;  Surgeon: [place] Bookbinder, MD;  Location: [place];  Service: General;  Laterality: Right;  . RADIOFREQUENCY ABLATION NERVES  05/12/2003   uterosacral nerve ablation  . REMOVAL OF BILATERAL TISSUE EXPANDERS WITH PLACEMENT OF BILATERAL BREAST IMPLANTS Bilateral 03/09/2014   Procedure: REMOVAL OF BILATERAL TISSUE EXPANDERS WITH PLACEMENT OF BILATERAL BREAST IMPLANTS;  Surgeon: Theodoro Kos, DO;  Location: [place];  Service: Plastics;  Laterality: Bilateral;  . TEE WITHOUT CARDIOVERSION N/A 06/04/2013   Procedure: TRANSESOPHAGEAL ECHOCARDIOGRAM (TEE);  Surgeon: Jolaine Artist, MD;  Location: [facility];  Service: Cardiovascular;  Laterality: N/A;  . TISSUE EXPANDER PLACEMENT Left 06/16/2013   Procedure: LEFT BREAST TISSUE EXPANDER REMOVAL;  Surgeon: Theodoro Kos, DO;  Location: [place];  Service: Plastics;  Laterality: Left;  . TISSUE EXPANDER PLACEMENT Left 09/29/2013   Procedure: TISSUE EXPANDER;  Surgeon: Theodoro Kos, DO;  Location: [place];  Service: Plastics;  Laterality: Left;  . TOTAL ABDOMINAL HYSTERECTOMY W/ BILATERAL SALPINGOOPHORECTOMY  03/28/2004  . TOTAL MASTECTOMY  12/31/2012   Procedure: TOTAL MASTECTOMY;  Surgeon: [place]m Bookbinder, MD;  Location: [place];  Service: General;  Laterality: Bilateral;  bilateral total mastectomies    There were no vitals filed for this visit.  Subjective Assessment - 04/28/18 1413    Subjective  "I was very sore after the last session and didn't have any pain saturday and sunday. I was doing great until I sat down for 3 hours stripping the deck and I have alot of soreness"     Patient Stated Goals  to get better, be knowledgable of ergonomics.     Currently in Pain?  Yes     Pain Score  4     Pain Orientation  Right    Pain Frequency  Intermittent    Aggravating Factors   painting the deck prolonged sitting    Pain Relieving Factors  medication, laying down, heat         [facility] PT Assessment - 04/28/18 0001      Strength   Right Hip Flexion  4+/5    Right Hip Extension  4/5    Right Hip External Rotation   5/5    Right Hip Internal Rotation  5/5    Right Hip ABduction  4/5    Right Hip ADduction  4+/5    Left Hip Flexion  5/5                   [facility] Adult PT Treatment/Exercise - 04/28/18 1440      Knee/Hip Exercises: Stretches   Hip Flexor Stretch  3 reps;30 seconds;5 reps 2 x adding hip ER contract/ relax with 10 sec       Knee/Hip Exercises: Seated   Hamstring Curl  2 sets;15 reps  Knee/Hip Exercises: Supine   Hip Adduction Isometric  10 reps;3 sets 2 x 10 alternating marching in sustained bridge position      Knee/Hip Exercises: Sidelying   Hip ABduction  2 sets with eccentric control, 10 CW/CCW       Knee/Hip Exercises: Prone   Other Prone Exercises  hamstring curl with hip extension 1 set going to fatigue 4#      Manual Therapy   Soft tissue mobilization  IASTM for adducotr longus/ magnus    Other Manual Therapy  skilled palpation with  TPDN       Trigger Point Dry Needling - 04/28/18 1448    Adductor Response  Twitch response elicited;Palpable increased muscle length longus/ magnus             PT Short Term Goals - 04/23/18 1326      PT SHORT TERM GOAL #1   Title  pt to be I with inital HEP    Baseline  Pt independent with intial stretch and strength program    Time  3    Period  Weeks    Status  Achieved      PT SHORT TERM GOAL #2   Title  pt to verbalize/ demo proper posture in multiple positions and proper lifting mechanics to prevent and reduce R hip pain    Baseline  Pt utilizing positions to releive pain in biking and yoga/pilates poses and decreasing use of high heels part of time    Period   Weeks    Status  On-going        PT Long Term Goals - 04/06/18 1520      PT LONG TERM GOAL #1   Title  pt to increase bil hip abductor/ extensor strength to >/= 4+/5 with no pain during testing for hip stability     Time  6    Period  Weeks    Status  New    Target Date  05/18/18      PT LONG TERM GOAL #2   Title  increase hip abduction ROM to demo reduced adductor tightness with </=1/10 pain     Time  6    Period  Weeks    Status  New    Target Date  05/18/18      PT LONG TERM GOAL #3   Title  pt to be able to sit/ stand and walk >/= 60 min with </= 1/10 pain and no report of catching or popping    Time  6    Period  Weeks    Status  New    Target Date  05/18/18      PT LONG TERM GOAL #4   Title  increase FOTO score to </=25% limited to demo improvement in function    Time  6    Period  Weeks    Status  New    Target Date  05/18/18      PT LONG TERM GOAL #5   Title  pt to be I with all HEP given as of last visit to maintain and progress current level of functoin     Time  6    Period  Weeks    Status  New    Target Date  05/18/18            Plan - 04/28/18 1458    Clinical Impression Statement  Ms. Benjaman Kindler reports conintued improvement with pain inthe hip and his progressing with hip  strength. Cotninued TPDN focusing more on distal adductors and continued working on hip stability increasing reps for endurance training. end of session which she reported 0/10 pain which she initally was at 4/10 today. She would benefit from continued physical therapy to decrease hip pain, promote proper posture, and return pt to PLOF.     PT Treatment/Interventions  ADLs/Self Care Home Management;Cryotherapy;Gait training;Stair training;Therapeutic activities;Therapeutic exercise;Manual techniques;Taping;Dry needling;Patient/family education;Passive range of motion;Balance training    PT Next Visit Plan  (hx of cx no heat/e-stim), update HEP PRN,  assess DN last visit for glute  med/ pirifomris and adductors, hamstrings. , hip strenthening, posture/ ergonomics,     PT Home Exercise Plan  ITB stretching (in L sidelying), standing hip abduction, adductor stretching, thomas stretching. hip flexor stretching and hamstring strengthening.     Consulted and Agree with Plan of Care  Patient       Patient will benefit from skilled therapeutic intervention in order to improve the following deficits and impairments:  Pain, Improper body mechanics, Postural dysfunction, Decreased strength, Decreased range of motion, Decreased endurance, Decreased activity tolerance, Decreased balance  Visit Diagnosis: Pain in right hip  Muscle weakness (generalized)  Stiffness of right hip, not elsewhere classified     Problem List Patient Active Problem List   Diagnosis Date Noted  . Dyspnea 09/11/2015  . Breast cancer of upper-outer quadrant of left female breast ([place]) 06/28/2015  . Right hip pain 09/19/2014  . Right knee pain 09/19/2014  . Dyspnea on exertion 08/08/2014  . Heart palpitations 08/08/2014  . Acquired absence of breast and absent nipple 03/09/2014  . Pre-operative cardiovascular examination 09/09/2013  . Other chronic pulmonary heart diseases 05/27/2013  . SIRS (systemic inflammatory response syndrome) ([place]) 04/14/2013  . Anemia 04/14/2013  . Dehydration 04/14/2013  . Fever 04/14/2013  . Allergy   . IBS (irritable bowel syndrome)   . Thyroid disease   . ADD (attention deficit disorder)   . Lyme disease   . Asthma   . Hypothyroidism   . Contact lens/glasses fitting    Starr Lake PT, DPT, LAT, ATC  04/28/18  3:05 PM      [facility] [address] [place], [place], [address] Phone: [phone]   Fax:  [phone]  Name: CAITRIN PENDERGRAPH MRN: [id number] Date of Birth: [birth year]

## 2018-04-29 ENCOUNTER — Encounter: Payer: Self-pay | Admitting: Family Medicine

## 2018-04-29 ENCOUNTER — Ambulatory Visit: Payer: 59 | Admitting: Family Medicine

## 2018-04-29 DIAGNOSIS — M25551 Pain in right hip: Secondary | ICD-10-CM

## 2018-04-29 NOTE — Progress Notes (Signed)
   HPI  CC: Follow-up right hip/groin pain Patient is a very pleasant 62yo breast cancer survivor who is here to follow-up regarding her right sided hip/groin pain.  She was last seen here approximately 1 month ago and diagnosed with likely gluteus medius tendinitis and piriformis tendinitis.  She was referred to physical therapy for strengthening and dry needling.  Patient endorses significant improvement since last visit.  She states that she is approximately 50 to 70% better.  She is very pleased with her current progress and does not feel as though she needs any additional interventions at this time.  She states that she still has some discomfort which is mostly located to the medial aspect of her proximal right thigh and groin.  She denies any recent setbacks.  No weakness, numbness, or paresthesias.  Medications/Interventions Tried: Formal physical therapy and dry needling  See HPI and/or previous note for associated ROS.  Objective: BP 138/82   Ht 5\' 5"  (1.651 m)   Wt 148 lb (67.1 kg)   BMI 24.63 kg/m  Gen: NAD, well groomed, a/o x3, normal affect.  CV: Well-perfused. Warm.  Resp: Non-labored.  Neuro: Sensation intact throughout. No gross coordination deficits.  Gait: Nonpathologic posture, unremarkable stride without signs of limp or balance issues. Hip, Right: TTP noted at the anterior/medial/slightly posterior hip and groin. No obvious rash, erythema, ecchymosis, or edema. ROM full in all directions; Strength 5/5. Greater trochanter without tenderness to palpation. No SI joint tenderness and normal minimal SI movement.   Assessment and plan:  Right hip pain Patient is here to follow-up regarding her right-sided hip/groin pain.  Original diagnosis was gluteus medius/piriformis tendinitis.  Per patient's reports it seems as though patient may have more involvement of her aDductor muscle group instead of the aBductors as previously thought. -Continue physical therapy and dry  needling -Encouraged continuation of pelvic girdle strengthening exercises at home. -Ice as needed -Follow-up PRN   Elberta Leatherwood, MD,MS [facility] 04/29/2018 4:43 PM

## 2018-04-29 NOTE — Assessment & Plan Note (Signed)
Patient is here to follow-up regarding her right-sided hip/groin pain.  Original diagnosis was gluteus medius/piriformis tendinitis.  Per patient's reports it seems as though patient may have more involvement of her aDductor muscle group instead of the aBductors as previously thought. -Continue physical therapy and dry needling -Encouraged continuation of pelvic girdle strengthening exercises at home. -Ice as needed -Follow-up PRN

## 2018-04-30 ENCOUNTER — Encounter: Payer: Self-pay | Admitting: Physical Therapy

## 2018-04-30 ENCOUNTER — Ambulatory Visit: Payer: 59 | Admitting: Physical Therapy

## 2018-04-30 DIAGNOSIS — M6281 Muscle weakness (generalized): Secondary | ICD-10-CM

## 2018-04-30 DIAGNOSIS — M25551 Pain in right hip: Secondary | ICD-10-CM | POA: Diagnosis not present

## 2018-04-30 DIAGNOSIS — M25651 Stiffness of right hip, not elsewhere classified: Secondary | ICD-10-CM

## 2018-04-30 NOTE — Patient Instructions (Signed)
        Voncille Lo, PT Certified Exercise Expert for the Aging Adult  04/30/18 3:48 PM Phone: [phone] Fax: [phone]

## 2018-04-30 NOTE — Therapy (Signed)
[place], [place], [address] Phone: [phone]   Fax:  [phone]  Physical Therapy Treatment  Patient Details  Name: Jane Gutierrez MRN: [id number] Date of Birth: 1955/12/26 Referring Provider: Elberta Leatherwood md   Encounter Date: 04/30/2018  PT End of Session - 04/30/18 1555    Visit Number  7    Number of Visits  13    Date for PT Re-Evaluation  05/18/18    Authorization Type  [facility]    PT Start Time  1455    PT Stop Time  1550    PT Time Calculation (min)  55 min    Activity Tolerance  Patient tolerated treatment well    Behavior During Therapy  [facility] for tasks assessed/performed       Past Medical History:  Diagnosis Date  . Abnormally small mouth   . ADD (attention deficit disorder)   . ADHD   . Allergy   . Anxiety    pt reported she doesn't have anxiety  . Asthma    prn inhaler  . Blood transfusion without reported diagnosis   . Breast cancer ([place])   . Dental crowns present   . Difficulty swallowing pills   . History of breast cancer   . History of cervical cancer   . History of palpitations    last echo 08/22/2014  . Hypothyroidism   . IBS (irritable bowel syndrome)     Past Surgical History:  Procedure Laterality Date  . ABDOMINAL HYSTERECTOMY    . ABDOMINOPLASTY  03/28/2004  . AXILLARY SENTINEL NODE BIOPSY  12/31/2012   Procedure: AXILLARY SENTINEL NODE BIOPSY;  Surgeon: Rolm Bookbinder, MD;  Location: [place];  Service: General;  Laterality: Bilateral;  bilateral sentinel node  . BRAIN SURGERY     removal benign frontal lobe cyst in 1987  . BREAST ENHANCEMENT SURGERY Bilateral ~ 2008  . BREAST IMPLANT EXCHANGE Bilateral 12/11/2017   Procedure: BILATERAL IMPLANT EXCHANGE;  Surgeon: Wallace Going, DO;  Location: [place];  Service: Plastics;  Laterality: Bilateral;  . BREAST RECONSTRUCTION WITH PLACEMENT OF TISSUE EXPANDER AND FLEX HD  (ACELLULAR HYDRATED DERMIS)  12/31/2012   Procedure: BREAST RECONSTRUCTION WITH PLACEMENT OF TISSUE EXPANDER AND FLEX HD (ACELLULAR HYDRATED DERMIS);  Surgeon: Theodoro Kos, DO;  Location: [place];  Service: Plastics;  Laterality: Bilateral;  bilateral immediate breast reconstruction with expanders and flex hd   . CAPSULOTOMY Left 09/01/2014   Procedure: CAPSULOTOMY OF LEFT BREAST WITH LIPOFILLING FOR SYMMETRY;  Surgeon: Theodoro Kos, DO;  Location: [place];  Service: Plastics;  Laterality: Left;  . CERVICAL CERCLAGE    . DIAGNOSTIC LAPAROSCOPY  05/12/2003   right partial salpingectomy; lysis paraovarian adhesions left  . DILATION AND CURETTAGE OF UTERUS  2005  . ENDOMETRIAL FULGURATION  05/12/2003  . FACIAL COSMETIC SURGERY  ~ 2010  . FOOT SURGERY Bilateral 07/2012  . GYNECOLOGIC CRYOSURGERY  1998  . INCISION AND DRAINAGE OF WOUND Left 04/28/2013   Procedure: IRRIGATION AND DEBRIDEMENT LEFT BREAST WOUND, POSSIBLE CLOSURE OF WOUND WITH DRAIN PLACEMENT;  Surgeon: Theodoro Kos, DO;  Location: [place];  Service: Plastics;  Laterality: Left;  . LATISSIMUS FLAP TO BREAST Left 09/29/2013   Procedure: LATISSIMUS FLAP TO BREAST WITH TISSUE EXPANDER PLACEMENT FOR LEFT BREAST RECONSTRUCTION;  Surgeon: Theodoro Kos, DO;  Location: [place];  Service: Plastics;  Laterality: Left;  . LIPECTOMY Bilateral 03/28/2004   hip  .  LIPOSUCTION Bilateral 03/09/2014   Procedure: LIPOSUCTION ABDOMEN W/ LIPOFILLING LATERAL BILATERAL BREAST;  Surgeon: Theodoro Kos, DO;  Location: [place];  Service: Plastics;  Laterality: Bilateral;  LIPOSUCTION NECK  . LIPOSUCTION WITH LIPOFILLING Bilateral 09/01/2014   Procedure: LIPOSUCTION WITH LIPOFILLING;  Surgeon: Theodoro Kos, DO;  Location: [place];  Service: Plastics;  Laterality: Bilateral;  . LYSIS OF ADHESION  03/28/2004   pelvic  . MASTECTOMY    . PORT-A-CATH REMOVAL Right 03/09/2014    Procedure: REMOVAL PORT-A-CATH;  Surgeon: Theodoro Kos, DO;  Location: [place];  Service: Plastics;  Laterality: Right;  . PORTACATH PLACEMENT  01/27/2013   Procedure: INSERTION PORT-A-CATH;  Surgeon: Rolm Bookbinder, MD;  Location: [place];  Service: General;  Laterality: Right;  . RADIOFREQUENCY ABLATION NERVES  05/12/2003   uterosacral nerve ablation  . REMOVAL OF BILATERAL TISSUE EXPANDERS WITH PLACEMENT OF BILATERAL BREAST IMPLANTS Bilateral 03/09/2014   Procedure: REMOVAL OF BILATERAL TISSUE EXPANDERS WITH PLACEMENT OF BILATERAL BREAST IMPLANTS;  Surgeon: Theodoro Kos, DO;  Location: [place];  Service: Plastics;  Laterality: Bilateral;  . TEE WITHOUT CARDIOVERSION N/A 06/04/2013   Procedure: TRANSESOPHAGEAL ECHOCARDIOGRAM (TEE);  Surgeon: Jolaine Artist, MD;  Location: [facility] ENDOSCOPY;  Service: Cardiovascular;  Laterality: N/A;  . TISSUE EXPANDER PLACEMENT Left 06/16/2013   Procedure: LEFT BREAST TISSUE EXPANDER REMOVAL;  Surgeon: Theodoro Kos, DO;  Location: [place];  Service: Plastics;  Laterality: Left;  . TISSUE EXPANDER PLACEMENT Left 09/29/2013   Procedure: TISSUE EXPANDER;  Surgeon: Theodoro Kos, DO;  Location: [place];  Service: Plastics;  Laterality: Left;  . TOTAL ABDOMINAL HYSTERECTOMY W/ BILATERAL SALPINGOOPHORECTOMY  03/28/2004  . TOTAL MASTECTOMY  12/31/2012   Procedure: TOTAL MASTECTOMY;  Surgeon: Rolm Bookbinder, MD;  Location: [place];  Service: General;  Laterality: Bilateral;  bilateral total mastectomies    There were no vitals filed for this visit.  Subjective Assessment - 04/30/18 1500    Subjective  I am .05/10 today.   I am going to [facility] for last two visits    How long can you sit comfortably?  Pt riding bike 30 minutes on even road    How long can you stand comfortably?  unlimited    How long can you walk comfortably?  unlimited    Diagnostic tests  x-ray    Patient  Stated Goals  to get better, be knowledgable of ergonomics.     Currently in Pain?  Yes    Pain Score  1  .05 according to pt.    Pain Location  Hip    Pain Orientation  Right    Pain Descriptors / Indicators  Aching    Pain Type  Chronic pain    Pain Onset  More than a month ago                       [facility] Adult PT Treatment/Exercise - 04/30/18 1504      Self-Care   Self-Care  Other Self-Care Comments    Other Self-Care Comments   reviewed positions for yoga and/ biking and household chores for reduction of hip pain      Knee/Hip Exercises: Stretches   Hip Flexor Stretch  3 reps;30 seconds;5 reps 2 x adding hip ER contract/ relax with 10 sec       Knee/Hip Exercises: Seated   Hamstring Curl  --      Knee/Hip Exercises:  Supine   Hip Adduction Isometric  10 reps;3 sets 2 x 10 alternating marching in sustained bridge position    Other Supine Knee/Hip Exercises  supine hamstring curl on swill ball 15 x 2      Knee/Hip Exercises: Sidelying   Hip ABduction  2 sets with eccentric control, 10 CW/CCW       Knee/Hip Exercises: Prone   Other Prone Exercises  hamstring curl with hip extension 15 x 2 with 5 lb wt  5#      Moist Heat Therapy   Number Minutes Moist Heat  12 Minutes    Moist Heat Location  Hip right hamstring      Ultrasound   Ultrasound Location  right hamstring    Ultrasound Parameters  100% continueous 1.6 w/cm2 8 min    Ultrasound Goals  Pain      Manual Therapy   Manual Therapy  Soft tissue mobilization    Soft tissue mobilization  IASTM for right hamstring    Other Manual Therapy  skilled palpation with  TPDN       Trigger Point Dry Needling - 04/30/18 1520    Consent Given?  Yes    Education Handout Provided  No previously given    Hamstring Response  Twitch response elicited;Palpable increased muscle length right           PT Education - 04/30/18 1549    Education provided  Yes    Education Details  added to HEP     Person(s)  Educated  Patient    Methods  Explanation;Demonstration;Verbal cues;Tactile cues;Handout    Comprehension  Verbalized understanding;Returned demonstration       PT Short Term Goals - 04/30/18 1613      PT SHORT TERM GOAL #1   Title  pt to be I with inital HEP    Baseline  Pt independent with initial and working on advanced/ pilates next two visits    Time  3    Period  Weeks    Status  Achieved      PT SHORT TERM GOAL #2   Title  pt to verbalize/ demo proper posture in multiple positions and proper lifting mechanics to prevent and reduce R hip pain    Baseline  Pt utilizing positions to releive pain in biking and yoga/pilates poses and decreasing use of high heels part of time and household / exercises    Time  3    Period  Weeks    Status  Achieved        PT Long Term Goals - 04/30/18 1614      PT LONG TERM GOAL #1   Title  pt to increase bil hip abductor/ extensor strength to >/= 4+/5 with no pain during testing for hip stability     Baseline  Pt right abd hip 4+/5 and add 5/5    Time  6    Period  Weeks    Status  Achieved      PT LONG TERM GOAL #2   Title  increase hip abduction ROM to demo reduced adductor tightness with </=1/10 pain     Baseline  Pt reports .05/10 today in pain    Time  6    Period  Weeks    Status  On-going      PT LONG TERM GOAL #3   Title  pt to be able to sit/ stand and walk >/= 60 min with </= 1/10 pain and no report of  catching or popping    Baseline  30 minute riding bike even ground  Now at .05/10 and no more than 1/10    Time  6    Period  Weeks    Status  On-going      PT LONG TERM GOAL #4   Title  increase FOTO score to </=25% limited to demo improvement in function    Time  6    Period  Weeks    Status  Unable to assess      PT LONG TERM GOAL #5   Title  pt to be I with all HEP given as of last visit to maintain and progress current level of functoin and incorporate Pilates principles    Time  6    Period  Weeks    Status   Revised            Plan - 04/30/18 1606    Clinical Impression Statement  Jane Gutierrez reports .05/10 pain in left hip, mainly on right hamstring proximal portion.  Pt request TPDN for hamstring and had tingling /pain on palpation.  after RX, pt responded with 0/10 pain in right hamstring with manual , ther ex [place].  Pt  with right hip ADD 5/5 and abd 4+/5  Pt has achieved all STG's and will complete LTG's at last two sessions.  Pt has home [employer], [employer] and will complete two visits with PT and Pilates certified [facility] PT for last two visits.  Pt needs to complete FOTO    Rehab Potential  Good    PT Frequency  2x / week    PT Duration  6 weeks    PT Treatment/Interventions  ADLs/Self Care Home Management;Cryotherapy;Gait training;Stair training;Therapeutic activities;Therapeutic exercise;Manual techniques;Taping;Dry needling;Patient/family education;Passive range of motion;Balance training    PT Next Visit Plan  (hx of cx no heat/e-stim), complete 2 sessions of Pilates instruction for home unit.  PLease do FOTO and and complete last HEP including Pilates instruction.  Last goal.    PT Home Exercise Plan  ITB stretching (in L sidelying), standing hip abduction, adductor stretching, thomas stretching. hip flexor stretching and hamstring strengthening.     Consulted and Agree with Plan of Care  Patient       Patient will benefit from skilled therapeutic intervention in order to improve the following deficits and impairments:  Pain, Improper body mechanics, Postural dysfunction, Decreased strength, Decreased range of motion, Decreased endurance, Decreased activity tolerance, Decreased balance  Visit Diagnosis: Pain in right hip  Muscle weakness (generalized)  Stiffness of right hip, not elsewhere classified     Problem List Patient Active Problem List   Diagnosis Date Noted  . Dyspnea 09/11/2015  . Breast cancer of upper-outer quadrant of left female breast ([place]) 06/28/2015  . Right  hip pain 09/19/2014  . Right knee pain 09/19/2014  . Dyspnea on exertion 08/08/2014  . Heart palpitations 08/08/2014  . Acquired absence of breast and absent nipple 03/09/2014  . Pre-operative cardiovascular examination 09/09/2013  . Other chronic pulmonary heart diseases 05/27/2013  . SIRS (systemic inflammatory response syndrome) ([place]) 04/14/2013  . Anemia 04/14/2013  . Dehydration 04/14/2013  . Fever 04/14/2013  . Allergy   . IBS (irritable bowel syndrome)   . Thyroid disease   . ADD (attention deficit disorder)   . Lyme disease   . Asthma   . Hypothyroidism   . Contact lens/glasses fitting     Voncille Lo, PT Certified Exercise Expert for the Aging Adult  04/30/18 4:24 PM Phone: [phone] Fax: [place] [facility] [address] [place], [place], [address] Phone: [phone]   Fax:  [phone]  Name: Jane Gutierrez MRN: [id number] Date of Birth: July 24, 1956

## 2018-05-11 ENCOUNTER — Ambulatory Visit: Payer: 59 | Admitting: Physical Therapy

## 2018-05-11 ENCOUNTER — Encounter: Payer: Self-pay | Admitting: Physical Therapy

## 2018-05-11 DIAGNOSIS — M25551 Pain in right hip: Secondary | ICD-10-CM

## 2018-05-11 DIAGNOSIS — M25651 Stiffness of right hip, not elsewhere classified: Secondary | ICD-10-CM | POA: Diagnosis not present

## 2018-05-11 DIAGNOSIS — M6281 Muscle weakness (generalized): Secondary | ICD-10-CM | POA: Diagnosis not present

## 2018-05-11 NOTE — Therapy (Signed)
[place], [place], [address] Phone: [phone]   Fax:  [phone]  Physical Therapy Treatment  Patient Details  Name: Jane Gutierrez MRN: [id number] Date of Birth: Jun 14, 1956 Referring Provider: Elberta Leatherwood md   Encounter Date: 05/11/2018  PT End of Session - 05/11/18 0800    Visit Number  8    Number of Visits  13    Date for PT Re-Evaluation  05/18/18    Authorization Type  UMR    PT Start Time  0800    PT Stop Time  0841    PT Time Calculation (min)  41 min    Activity Tolerance  Patient tolerated treatment well    Behavior During Therapy  [facility] for tasks assessed/performed       Past Medical History:  Diagnosis Date  . Abnormally small mouth   . ADD (attention deficit disorder)   . ADHD   . Allergy   . Anxiety    pt reported she doesn't have anxiety  . Asthma    prn inhaler  . Blood transfusion without reported diagnosis   . Breast cancer ([place])   . Dental crowns present   . Difficulty swallowing pills   . History of breast cancer   . History of cervical cancer   . History of palpitations    last echo 08/22/2014  . Hypothyroidism   . IBS (irritable bowel syndrome)     Past Surgical History:  Procedure Laterality Date  . ABDOMINAL HYSTERECTOMY    . ABDOMINOPLASTY  03/28/2004  . AXILLARY SENTINEL NODE BIOPSY  12/31/2012   Procedure: AXILLARY SENTINEL NODE BIOPSY;  Surgeon: Rolm Bookbinder, MD;  Location: [place];  Service: General;  Laterality: Bilateral;  bilateral sentinel node  . BRAIN SURGERY     removal benign frontal lobe cyst in 1987  . BREAST ENHANCEMENT SURGERY Bilateral ~ 2008  . BREAST IMPLANT EXCHANGE Bilateral 12/11/2017   Procedure: BILATERAL IMPLANT EXCHANGE;  Surgeon: Wallace Going, DO;  Location: [place];  Service: Plastics;  Laterality: Bilateral;  . BREAST RECONSTRUCTION WITH PLACEMENT OF TISSUE EXPANDER AND FLEX HD  (ACELLULAR HYDRATED DERMIS)  12/31/2012   Procedure: BREAST RECONSTRUCTION WITH PLACEMENT OF TISSUE EXPANDER AND FLEX HD (ACELLULAR HYDRATED DERMIS);  Surgeon: Theodoro Kos, DO;  Location: [place];  Service: Plastics;  Laterality: Bilateral;  bilateral immediate breast reconstruction with expanders and flex hd   . CAPSULOTOMY Left 09/01/2014   Procedure: CAPSULOTOMY OF LEFT BREAST WITH LIPOFILLING FOR SYMMETRY;  Surgeon: Theodoro Kos, DO;  Location: [place];  Service: Plastics;  Laterality: Left;  . CERVICAL CERCLAGE    . DIAGNOSTIC LAPAROSCOPY  05/12/2003   right partial salpingectomy; lysis paraovarian adhesions left  . DILATION AND CURETTAGE OF UTERUS  2005  . ENDOMETRIAL FULGURATION  05/12/2003  . FACIAL COSMETIC SURGERY  ~ 2010  . FOOT SURGERY Bilateral 07/2012  . GYNECOLOGIC CRYOSURGERY  1998  . INCISION AND DRAINAGE OF WOUND Left 04/28/2013   Procedure: IRRIGATION AND DEBRIDEMENT LEFT BREAST WOUND, POSSIBLE CLOSURE OF WOUND WITH DRAIN PLACEMENT;  Surgeon: Theodoro Kos, DO;  Location: [place];  Service: Plastics;  Laterality: Left;  . LATISSIMUS FLAP TO BREAST Left 09/29/2013   Procedure: LATISSIMUS FLAP TO BREAST WITH TISSUE EXPANDER PLACEMENT FOR LEFT BREAST RECONSTRUCTION;  Surgeon: Theodoro Kos, DO;  Location: [place];  Service: Plastics;  Laterality: Left;  . LIPECTOMY Bilateral 03/28/2004   hip  .  LIPOSUCTION Bilateral 03/09/2014   Procedure: LIPOSUCTION ABDOMEN W/ LIPOFILLING LATERAL BILATERAL BREAST;  Surgeon: Theodoro Kos, DO;  Location: [place];  Service: Plastics;  Laterality: Bilateral;  LIPOSUCTION NECK  . LIPOSUCTION WITH LIPOFILLING Bilateral 09/01/2014   Procedure: LIPOSUCTION WITH LIPOFILLING;  Surgeon: Theodoro Kos, DO;  Location: [place];  Service: Plastics;  Laterality: Bilateral;  . LYSIS OF ADHESION  03/28/2004   pelvic  . MASTECTOMY    . PORT-A-CATH REMOVAL Right 03/09/2014    Procedure: REMOVAL PORT-A-CATH;  Surgeon: Theodoro Kos, DO;  Location: [place];  Service: Plastics;  Laterality: Right;  . PORTACATH PLACEMENT  01/27/2013   Procedure: INSERTION PORT-A-CATH;  Surgeon: Rolm Bookbinder, MD;  Location: [place];  Service: General;  Laterality: Right;  . RADIOFREQUENCY ABLATION NERVES  05/12/2003   uterosacral nerve ablation  . REMOVAL OF BILATERAL TISSUE EXPANDERS WITH PLACEMENT OF BILATERAL BREAST IMPLANTS Bilateral 03/09/2014   Procedure: REMOVAL OF BILATERAL TISSUE EXPANDERS WITH PLACEMENT OF BILATERAL BREAST IMPLANTS;  Surgeon: Theodoro Kos, DO;  Location: [place];  Service: Plastics;  Laterality: Bilateral;  . TEE WITHOUT CARDIOVERSION N/A 06/04/2013   Procedure: TRANSESOPHAGEAL ECHOCARDIOGRAM (TEE);  Surgeon: Jolaine Artist, MD;  Location: [facility] ENDOSCOPY;  Service: Cardiovascular;  Laterality: N/A;  . TISSUE EXPANDER PLACEMENT Left 06/16/2013   Procedure: LEFT BREAST TISSUE EXPANDER REMOVAL;  Surgeon: Theodoro Kos, DO;  Location: [place];  Service: Plastics;  Laterality: Left;  . TISSUE EXPANDER PLACEMENT Left 09/29/2013   Procedure: TISSUE EXPANDER;  Surgeon: Theodoro Kos, DO;  Location: [place];  Service: Plastics;  Laterality: Left;  . TOTAL ABDOMINAL HYSTERECTOMY W/ BILATERAL SALPINGOOPHORECTOMY  03/28/2004  . TOTAL MASTECTOMY  12/31/2012   Procedure: TOTAL MASTECTOMY;  Surgeon: Rolm Bookbinder, MD;  Location: [place];  Service: General;  Laterality: Bilateral;  bilateral total mastectomies    There were no vitals filed for this visit.  Subjective Assessment - 05/11/18 0805    Subjective  Just got back from [place], did so much walking.  Was not limited by pain, just felt tired and am really tight.     Currently in Pain?  No/denies                       [facility] Adult PT Treatment/Exercise - 05/11/18 0001      Self-Care   Other Self-Care Comments    Pilates Reformer, home routine and resources        Pilates   Pilates Reformer  See note       Pilates Reformer used for LE/core strength, postural strength, lumbopelvic disassociation and core control.  Exercises included:  Footwork 2 Red 1 blue parallel and turnout on heels and then forefoot.   Calf stretching, 30 sec bilateral and unilateral Needed cues for LE alignment  Bridging  2 Red 1 Blue x 10 excellent articulation, fatigues.  Better with ball at knees.  Press out x 5 Single leg bridging x 5 , cues for pelvic stability.  Feet in Straps 1 Red 1 Blue  Arcs, Circles and Squats, again, cues for controlled ROM in hips  Single leg stretching with 1 red : hamstring (to 90 deg +) abduction and adduction  Quadruped 1 Red on elbows (knee stretch) to modified plank  Reverse Abdominals 1 blue UE x 5, LE x 5 (slight aggravation to Rt groin due to resisted hip flexion)      PT Education - 05/11/18  0816    Education provided  Yes    Education Details  Pilates Reformer for home     Person(s) Educated  Patient    Methods  Explanation    Comprehension  Verbalized understanding       PT Short Term Goals - 04/30/18 1613      PT SHORT TERM GOAL #1   Title  pt to be I with inital HEP    Baseline  Pt independent with initial and working on advanced/ pilates next two visits    Time  3    Period  Weeks    Status  Achieved      PT SHORT TERM GOAL #2   Title  pt to verbalize/ demo proper posture in multiple positions and proper lifting mechanics to prevent and reduce R hip pain    Baseline  Pt utilizing positions to releive pain in biking and yoga/pilates poses and decreasing use of high heels part of time and household / exercises    Time  3    Period  Weeks    Status  Achieved        PT Long Term Goals - 04/30/18 1614      PT LONG TERM GOAL #1   Title  pt to increase bil hip abductor/ extensor strength to >/= 4+/5 with no pain during testing for hip stability     Baseline   Pt right abd hip 4+/5 and add 5/5    Time  6    Period  Weeks    Status  Achieved      PT LONG TERM GOAL #2   Title  increase hip abduction ROM to demo reduced adductor tightness with </=1/10 pain     Baseline  Pt reports .05/10 today in pain    Time  6    Period  Weeks    Status  On-going      PT LONG TERM GOAL #3   Title  pt to be able to sit/ stand and walk >/= 60 min with </= 1/10 pain and no report of catching or popping    Baseline  30 minute riding bike even ground  Now at .05/10 and no more than 1/10    Time  6    Period  Weeks    Status  On-going      PT LONG TERM GOAL #4   Title  increase FOTO score to </=25% limited to demo improvement in function    Time  6    Period  Weeks    Status  Unable to assess      PT LONG TERM GOAL #5   Title  pt to be I with all HEP given as of last visit to maintain and progress current level of functoin and incorporate Pilates principles    Time  6    Period  Weeks    Status  Revised            Plan - 05/11/18 0846    Clinical Impression Statement  Patient did well with basic HEP on her home Reformer.  She needed cues for decreasing ROM of hips for feet in straps exercises.  She has excellent flexibility in hips but may benefit from more controlled ROM for stabilization work.  No pain.     PT Treatment/Interventions  ADLs/Self Care Home Management;Cryotherapy;Gait training;Stair training;Therapeutic activities;Therapeutic exercise;Manual techniques;Taping;Dry needling;Patient/family education;Passive range of motion;Balance training    PT Next Visit Plan  Pilates review, FOTO  ,  answer any questions about home routine we came up with     PT Home Exercise Plan  ITB stretching (in L sidelying), standing hip abduction, adductor stretching, thomas stretching. hip flexor stretching and hamstring strengthening.     Consulted and Agree with Plan of Care  Patient       Patient will benefit from skilled therapeutic intervention in order  to improve the following deficits and impairments:  Pain, Improper body mechanics, Postural dysfunction, Decreased strength, Decreased range of motion, Decreased endurance, Decreased activity tolerance, Decreased balance  Visit Diagnosis: Pain in right hip  Muscle weakness (generalized)  Stiffness of right hip, not elsewhere classified     Problem List Patient Active Problem List   Diagnosis Date Noted  . Dyspnea 09/11/2015  . Breast cancer of upper-outer quadrant of left female breast ([place]) 06/28/2015  . Right hip pain 09/19/2014  . Right knee pain 09/19/2014  . Dyspnea on exertion 08/08/2014  . Heart palpitations 08/08/2014  . Acquired absence of breast and absent nipple 03/09/2014  . Pre-operative cardiovascular examination 09/09/2013  . Other chronic pulmonary heart diseases 05/27/2013  . SIRS (systemic inflammatory response syndrome) ([place]) 04/14/2013  . Anemia 04/14/2013  . Dehydration 04/14/2013  . Fever 04/14/2013  . Allergy   . IBS (irritable bowel syndrome)   . Thyroid disease   . ADD (attention deficit disorder)   . Lyme disease   . Asthma   . Hypothyroidism   . Contact lens/glasses fitting     Rayma Hegg 05/11/2018, 8:59 AM  [place], [place], [address] Phone: [phone]   Fax:  [phone]  Name: Jane Gutierrez MRN: [id number] Date of Birth: Mar 01, 1956  Raeford Razor, PT 05/11/18 8:59 AM Phone: [phone] Fax: [phone]

## 2018-05-13 ENCOUNTER — Ambulatory Visit: Payer: 59 | Admitting: Physical Therapy

## 2018-05-13 ENCOUNTER — Encounter: Payer: Self-pay | Admitting: Physical Therapy

## 2018-05-13 DIAGNOSIS — M25651 Stiffness of right hip, not elsewhere classified: Secondary | ICD-10-CM

## 2018-05-13 DIAGNOSIS — M25551 Pain in right hip: Secondary | ICD-10-CM | POA: Diagnosis not present

## 2018-05-13 DIAGNOSIS — M6281 Muscle weakness (generalized): Secondary | ICD-10-CM | POA: Diagnosis not present

## 2018-05-13 NOTE — Therapy (Signed)
[place], [place], [address] Phone: [phone]   Fax:  [phone]  Physical Therapy Treatment/Discharge   Patient Details  Name: Jane Gutierrez MRN: [id number] Date of Birth: Jun 24, 1956 Referring Provider: Elberta Leatherwood md   Encounter Date: 05/13/2018  PT End of Session - 05/13/18 0926    Visit Number  9    Number of Visits  13    Date for PT Re-Evaluation  05/18/18    Authorization Type  UMR    PT Start Time  0849    PT Stop Time  0923    PT Time Calculation (min)  34 min    Activity Tolerance  Patient tolerated treatment well    Behavior During Therapy  [facility] for tasks assessed/performed       Past Medical History:  Diagnosis Date  . Abnormally small mouth   . ADD (attention deficit disorder)   . ADHD   . Allergy   . Anxiety    pt reported she doesn't have anxiety  . Asthma    prn inhaler  . Blood transfusion without reported diagnosis   . Breast cancer ([place])   . Dental crowns present   . Difficulty swallowing pills   . History of breast cancer   . History of cervical cancer   . History of palpitations    last echo 08/22/2014  . Hypothyroidism   . IBS (irritable bowel syndrome)     Past Surgical History:  Procedure Laterality Date  . ABDOMINAL HYSTERECTOMY    . ABDOMINOPLASTY  03/28/2004  . AXILLARY SENTINEL NODE BIOPSY  12/31/2012   Procedure: AXILLARY SENTINEL NODE BIOPSY;  Surgeon: Rolm Bookbinder, MD;  Location: [place];  Service: General;  Laterality: Bilateral;  bilateral sentinel node  . BRAIN SURGERY     removal benign frontal lobe cyst in 1987  . BREAST ENHANCEMENT SURGERY Bilateral ~ 2008  . BREAST IMPLANT EXCHANGE Bilateral 12/11/2017   Procedure: BILATERAL IMPLANT EXCHANGE;  Surgeon: Wallace Going, DO;  Location: [place];  Service: Plastics;  Laterality: Bilateral;  . BREAST RECONSTRUCTION WITH PLACEMENT OF TISSUE EXPANDER AND FLEX  HD (ACELLULAR HYDRATED DERMIS)  12/31/2012   Procedure: BREAST RECONSTRUCTION WITH PLACEMENT OF TISSUE EXPANDER AND FLEX HD (ACELLULAR HYDRATED DERMIS);  Surgeon: Theodoro Kos, DO;  Location: [place];  Service: Plastics;  Laterality: Bilateral;  bilateral immediate breast reconstruction with expanders and flex hd   . CAPSULOTOMY Left 09/01/2014   Procedure: CAPSULOTOMY OF LEFT BREAST WITH LIPOFILLING FOR SYMMETRY;  Surgeon: Theodoro Kos, DO;  Location: [place];  Service: Plastics;  Laterality: Left;  . CERVICAL CERCLAGE    . DIAGNOSTIC LAPAROSCOPY  05/12/2003   right partial salpingectomy; lysis paraovarian adhesions left  . DILATION AND CURETTAGE OF UTERUS  2005  . ENDOMETRIAL FULGURATION  05/12/2003  . FACIAL COSMETIC SURGERY  ~ 2010  . FOOT SURGERY Bilateral 07/2012  . GYNECOLOGIC CRYOSURGERY  1998  . INCISION AND DRAINAGE OF WOUND Left 04/28/2013   Procedure: IRRIGATION AND DEBRIDEMENT LEFT BREAST WOUND, POSSIBLE CLOSURE OF WOUND WITH DRAIN PLACEMENT;  Surgeon: Theodoro Kos, DO;  Location: [place];  Service: Plastics;  Laterality: Left;  . LATISSIMUS FLAP TO BREAST Left 09/29/2013   Procedure: LATISSIMUS FLAP TO BREAST WITH TISSUE EXPANDER PLACEMENT FOR LEFT BREAST RECONSTRUCTION;  Surgeon: Theodoro Kos, DO;  Location: [place];  Service: Plastics;  Laterality: Left;  . LIPECTOMY Bilateral 03/28/2004  hip  . LIPOSUCTION Bilateral 03/09/2014   Procedure: LIPOSUCTION ABDOMEN W/ LIPOFILLING LATERAL BILATERAL BREAST;  Surgeon: Theodoro Kos, DO;  Location: [place];  Service: Plastics;  Laterality: Bilateral;  LIPOSUCTION NECK  . LIPOSUCTION WITH LIPOFILLING Bilateral 09/01/2014   Procedure: LIPOSUCTION WITH LIPOFILLING;  Surgeon: Theodoro Kos, DO;  Location: [place];  Service: Plastics;  Laterality: Bilateral;  . LYSIS OF ADHESION  03/28/2004   pelvic  . MASTECTOMY    . PORT-A-CATH REMOVAL Right 03/09/2014    Procedure: REMOVAL PORT-A-CATH;  Surgeon: Theodoro Kos, DO;  Location: [place];  Service: Plastics;  Laterality: Right;  . PORTACATH PLACEMENT  01/27/2013   Procedure: INSERTION PORT-A-CATH;  Surgeon: Rolm Bookbinder, MD;  Location: [place];  Service: General;  Laterality: Right;  . RADIOFREQUENCY ABLATION NERVES  05/12/2003   uterosacral nerve ablation  . REMOVAL OF BILATERAL TISSUE EXPANDERS WITH PLACEMENT OF BILATERAL BREAST IMPLANTS Bilateral 03/09/2014   Procedure: REMOVAL OF BILATERAL TISSUE EXPANDERS WITH PLACEMENT OF BILATERAL BREAST IMPLANTS;  Surgeon: Theodoro Kos, DO;  Location: [place];  Service: Plastics;  Laterality: Bilateral;  . TEE WITHOUT CARDIOVERSION N/A 06/04/2013   Procedure: TRANSESOPHAGEAL ECHOCARDIOGRAM (TEE);  Surgeon: Jolaine Artist, MD;  Location: [facility] ENDOSCOPY;  Service: Cardiovascular;  Laterality: N/A;  . TISSUE EXPANDER PLACEMENT Left 06/16/2013   Procedure: LEFT BREAST TISSUE EXPANDER REMOVAL;  Surgeon: Theodoro Kos, DO;  Location: [place];  Service: Plastics;  Laterality: Left;  . TISSUE EXPANDER PLACEMENT Left 09/29/2013   Procedure: TISSUE EXPANDER;  Surgeon: Theodoro Kos, DO;  Location: [place];  Service: Plastics;  Laterality: Left;  . TOTAL ABDOMINAL HYSTERECTOMY W/ BILATERAL SALPINGOOPHORECTOMY  03/28/2004  . TOTAL MASTECTOMY  12/31/2012   Procedure: TOTAL MASTECTOMY;  Surgeon: Rolm Bookbinder, MD;  Location: [place];  Service: General;  Laterality: Bilateral;  bilateral total mastectomies    There were no vitals filed for this visit.  Subjective Assessment - 05/13/18 0911    Subjective  Did some standing work on my Reformer and I strained it a bit.  I cant tell where my pelvis is in space. Biked 5 miles too. No pain now     Currently in Pain?  No/denies         Pilates Reformer used for LE/core strength, postural strength, lumbopelvic disassociation and core  control.  Exercises included:  Standing hip abduction , squats and skater 1 red  Standing Scooter 1 Red press back, reverse lunge, ant hip stretch  Downstretch 1 Red 1 Yellow variations on plank Knee stretching 1 red/  1 blue for variations    PT Education - 05/13/18 0926    Education provided  Yes    Education Details  final pilates review, DC , FOTO    Person(s) Educated  Patient    Methods  Explanation;Demonstration;Tactile cues;Verbal cues    Comprehension  Verbalized understanding;Returned demonstration       PT Short Term Goals - 04/30/18 1613      PT SHORT TERM GOAL #1   Title  pt to be I with inital HEP    Baseline  Pt independent with initial and working on advanced/ pilates next two visits    Time  3    Period  Weeks    Status  Achieved      PT SHORT TERM GOAL #2   Title  pt to verbalize/ demo proper posture in multiple positions and proper lifting mechanics to prevent  and reduce R hip pain    Baseline  Pt utilizing positions to releive pain in biking and yoga/pilates poses and decreasing use of high heels part of time and household / exercises    Time  3    Period  Weeks    Status  Achieved        PT Long Term Goals - 05/13/18 9147      PT LONG TERM GOAL #1   Title  pt to increase bil hip abductor/ extensor strength to >/= 4+/5 with no pain during testing for hip stability     Status  Achieved      PT LONG TERM GOAL #2   Title  increase hip abduction ROM to demo reduced adductor tightness with </=1/10 pain     Status  Achieved      PT LONG TERM GOAL #3   Title  pt to be able to sit/ stand and walk >/= 60 min with </= 1/10 pain and no report of catching or popping    Status  Achieved      PT LONG TERM GOAL #4   Title  increase FOTO score to </=25% limited to demo improvement in function    Baseline  17%    Status  Achieved      PT LONG TERM GOAL #5   Title  pt to be I with all HEP given as of last visit to maintain and progress current level of  functoin and incorporate Pilates principles    Status  Achieved            Plan - 05/13/18 8295    Clinical Impression Statement  Pt has met all LTGs and FOTO score has improved to only 17% limited.  She may transition to a community PIlates program when her husband recovers from an injury.      PT Next Visit Plan  DC    PT Home Exercise Plan  ITB stretching (in L sidelying), standing hip abduction, adductor stretching, thomas stretching. hip flexor stretching and hamstring strengthening.     Consulted and Agree with Plan of Care  Patient       Patient will benefit from skilled therapeutic intervention in order to improve the following deficits and impairments:  Pain, Improper body mechanics, Postural dysfunction, Decreased strength, Decreased range of motion, Decreased endurance, Decreased activity tolerance, Decreased balance  Visit Diagnosis: Pain in right hip  Muscle weakness (generalized)  Stiffness of right hip, not elsewhere classified     Problem List Patient Active Problem List   Diagnosis Date Noted  . Dyspnea 09/11/2015  . Breast cancer of upper-outer quadrant of left female breast ([place]) 06/28/2015  . Right hip pain 09/19/2014  . Right knee pain 09/19/2014  . Dyspnea on exertion 08/08/2014  . Heart palpitations 08/08/2014  . Acquired absence of breast and absent nipple 03/09/2014  . Pre-operative cardiovascular examination 09/09/2013  . Other chronic pulmonary heart diseases 05/27/2013  . SIRS (systemic inflammatory response syndrome) ([place]) 04/14/2013  . Anemia 04/14/2013  . Dehydration 04/14/2013  . Fever 04/14/2013  . Allergy   . IBS (irritable bowel syndrome)   . Thyroid disease   . ADD (attention deficit disorder)   . Lyme disease   . Asthma   . Hypothyroidism   . Contact lens/glasses fitting     Jane Gutierrez 05/13/2018, 12:04 PM  [place], [place], [address] Phone:  [phone]   Fax:  [phone]  Name:  Jane Gutierrez MRN: [id number] Date of Birth: 10/21/1956   Raeford Razor, PT 05/13/18 12:04 PM Phone: [phone] Fax: [phone]   PHYSICAL THERAPY DISCHARGE SUMMARY  Visits from Start of Care: 9  Current functional level related to goals / functional outcomes: See above    Remaining deficits: None limiting function   Education / Equipment: HEP, core, Pilates   Plan: Patient agrees to discharge.  Patient goals were met. Patient is being discharged due to meeting the stated rehab goals.  ?????    Raeford Razor, PT 05/13/18 12:05 PM Phone: [phone] Fax: [phone]

## 2018-05-26 MED FILL — ESZOPICLONE 3 MG TABLET: 3 | 30 days supply | Qty: 30 | Fill #2

## 2018-06-08 MED FILL — NITROFURANTOIN MONO-MCR 100: 100 | 30 days supply | Qty: 30 | Fill #1

## 2018-06-17 ENCOUNTER — Other Ambulatory Visit: Payer: Self-pay | Admitting: Hematology and Oncology

## 2018-06-17 ENCOUNTER — Ambulatory Visit (INDEPENDENT_AMBULATORY_CARE_PROVIDER_SITE_OTHER): Payer: Self-pay | Admitting: Family Medicine

## 2018-06-17 VITALS — BP 125/75 | HR 89 | Temp 97.8°F | Resp 16 | Wt 155.0 lb

## 2018-06-17 DIAGNOSIS — B351 Tinea unguium: Secondary | ICD-10-CM

## 2018-06-17 DIAGNOSIS — L0390 Cellulitis, unspecified: Secondary | ICD-10-CM

## 2018-06-17 MED ORDER — DOXYCYCLINE HYCLATE 100 MG PO TABS: 100.0000 mg | ORAL_TABLET | Freq: Two times a day (BID) | ORAL | 0 refills | Status: DC

## 2018-06-17 MED FILL — DOXYCYCLINE HYCLATE 100 MG: 100 | 10 days supply | Qty: 20 | Fill #0

## 2018-06-17 NOTE — Progress Notes (Signed)
Jane Gutierrez is a 62 y.o. female who presents today with concerns of left index finger x 2 weeks she reports a trip out of country and having to clean a shower without gloves. It was upon return of this trip that she noticed the nail tenderness and breakdown of the nail bed.  Review of Systems  Constitutional: Negative for chills, fever and malaise/fatigue.  HENT: Negative for congestion, ear discharge, ear pain, sinus pain and sore throat.   Eyes: Negative.   Respiratory: Negative for cough, sputum production and shortness of breath.   Cardiovascular: Negative.  Negative for chest pain.  Gastrointestinal: Negative for abdominal pain, diarrhea, nausea and vomiting.  Genitourinary: Negative for dysuria, frequency, hematuria and urgency.  Musculoskeletal: Negative for myalgias.  Skin: Negative.   Neurological: Negative for headaches.  Endo/Heme/Allergies: Negative.   Psychiatric/Behavioral: Negative.     O: Vitals:   06/17/18 1414  BP: 125/75  Pulse: 89  Resp: 16  Temp: 97.8 F (36.6 C)  SpO2: 97%     Physical Exam  Constitutional: She is oriented to person, place, and time. Vital signs are normal. She appears well-developed and well-nourished. She is active.  Non-toxic appearance. She does not have a sickly appearance.  HENT:  Head: Normocephalic.  Right Ear: Hearing, tympanic membrane, external ear and ear canal normal.  Left Ear: Hearing, tympanic membrane, external ear and ear canal normal.  Nose: Nose normal.  Mouth/Throat: Uvula is midline and oropharynx is clear and moist.  Neck: Normal range of motion. Neck supple.  Cardiovascular: Normal rate, regular rhythm, normal heart sounds and normal pulses.  Pulmonary/Chest: Effort normal and breath sounds normal.  Abdominal: Soft. Bowel sounds are normal.  Musculoskeletal: Normal range of motion.  Lymphadenopathy:       Head (right side): No submental and no submandibular adenopathy present.       Head (left side): No  submental and no submandibular adenopathy present.    She has no cervical adenopathy.  Neurological: She is alert and oriented to person, place, and time.  Skin:  Left index finger nail appeared deteriorated and fragmented in appearance- color is WNL but mild thickening and a approx 1-2 cm chunk of nail missing at the base. Nail appears weak and surrounding skin is erythremic and tender to touch. Slight likeness to severe psoriasis nail involvement  Psychiatric: She has a normal mood and affect.  Vitals reviewed.   A: 1. Cellulitis, unspecified cellulitis site   2. Nail fungus    P: Exam findings, diagnosis etiology and medication use and indications reviewed with patient. Follow- Up and discharge instructions provided. No emergent/urgent issues found on exam.  Patient verbalized understanding of information provided and agrees with plan of care (POC), all questions answered.  1. Cellulitis, unspecified cellulitis site Will treat the bacterial infection and advised to seek specialist evaluation of the nail due to the severity and swiftness of the deterioration. Discussed with patient the wide differential and benefit of conclusive evaluation to diagnose a fungal infection and determine the cause. Patient is under the care of dermatology and agrees with plan to seek dermatology care. 2. Nail fungus Advised f/u with dermatology due to severity and PMH review risk factors (breast cancer, hx of elevated ANA, visual appearance and severity of nail on exam)

## 2018-06-17 NOTE — Patient Instructions (Signed)
Cellulitis, Adult Cellulitis is a skin infection. The infected area is usually red and sore. This condition occurs most often in the arms and lower legs. It is very important to get treated for this condition. Follow these instructions at home:  Take over-the-counter and prescription medicines only as told by your doctor.  If you were prescribed an antibiotic medicine, take it as told by your doctor. Do not stop taking the antibiotic even if you start to feel better.  Drink enough fluid to keep your pee (urine) clear or pale yellow.  Do not touch or rub the infected area.  Raise (elevate) the infected area above the level of your heart while you are sitting or lying down.  Place warm or cold wet cloths (warm or cold compresses) on the infected area. Do this as told by your doctor.  Keep all follow-up visits as told by your doctor. This is important. These visits let your doctor make sure your infection is not getting worse. Contact a doctor if:  You have a fever.  Your symptoms do not get better after 1-2 days of treatment.  Your bone or joint under the infected area starts to hurt after the skin has healed.  Your infection comes back. This can happen in the same area or another area.  You have a swollen bump in the infected area.  You have new symptoms.  You feel ill and also have muscle aches and pains. Get help right away if:  Your symptoms get worse.  You feel very sleepy.  You throw up (vomit) or have watery poop (diarrhea) for a long time.  There are red streaks coming from the infected area.  Your red area gets larger.  Your red area turns darker. This information is not intended to replace advice given to you by your health care provider. Make sure you discuss any questions you have with your health care provider. Document Released: 05/27/2008 Document Revised: 05/16/2016 Document Reviewed: 10/18/2015 Elsevier Interactive Patient Education  2018 [employer]. Fungal Nail Infection Fungal nail infection is a common fungal infection of the toenails or fingernails. This condition affects toenails more often than fingernails. More than one nail may be infected. The condition can be passed from person to person (is contagious). What are the causes? This condition is caused by a fungus. Several types of funguses can cause the infection. These funguses are common in moist and warm areas. If your hands or feet come into contact with the fungus, it may get into a crack in your fingernail or toenail and cause the infection. What increases the risk? The following factors may make you more likely to develop this condition:  Being female.  Having diabetes.  Being of older age.  Living with someone who has the fungus.  Walking barefoot in areas where the fungus thrives, such as showers or locker rooms.  Having poor circulation.  Wearing shoes and socks that cause your feet to sweat.  Having athlete's foot.  Having a nail injury or history of a recent nail surgery.  Having psoriasis.  Having a weak body defense system (immune system).  What are the signs or symptoms? Symptoms of this condition include:  A pale spot on the nail.  Thickening of the nail.  A nail that becomes yellow or brown.  A brittle or ragged nail edge.  A crumbling nail.  A nail that has lifted away from the nail bed.  How is this diagnosed? This condition is diagnosed with  a physical exam. Your health care provider may take a scraping or clipping from your nail to test for the fungus. How is this treated? Mild infections do not need treatment. If you have significant nail changes, treatment may include:  Oral antifungal medicines. You may need to take the medicine for several weeks or several months, and you may not see the results for a long time. These medicines can cause side effects. Ask your health care provider what problems to watch for.  Antifungal nail  polish and nail cream. These may be used along with oral antifungal medicines.  Laser treatment of the nail.  Surgery to remove the nail. This may be needed for the most severe infections.  Treatment takes a long time, and the infection may come back. Follow these instructions at home: Medicines  Take or apply over-the-counter and prescription medicines only as told by your health care provider.  Ask your health care provider about using over-the-counter mentholated ointment on your nails. Lifestyle   Do not share personal items, such as towels or nail clippers.  Trim your nails often.  Wash and dry your hands and feet every day.  Wear absorbent socks, and change your socks frequently.  Wear shoes that allow air to circulate, such as sandals or canvas tennis shoes. Throw out old shoes.  Wear rubber gloves if you are working with your hands in wet areas.  Do not walk barefoot in shower rooms or locker rooms.  Do not use a nail salon that does not use clean instruments.  Do not use artificial nails. General instructions  Keep all follow-up visits as told by your health care provider. This is important.  Use antifungal foot powder on your feet and in your shoes. Contact a health care provider if: Your infection is not getting better or it is getting worse after several months. This information is not intended to replace advice given to you by your health care provider. Make sure you discuss any questions you have with your health care provider. Document Released: 12/06/2000 Document Revised: 05/16/2016 Document Reviewed: 06/12/2015 Elsevier Interactive Patient Education  2018 [employer].

## 2018-06-18 ENCOUNTER — Telehealth: Payer: Self-pay | Admitting: Adult Health

## 2018-06-18 ENCOUNTER — Inpatient Hospital Stay: Payer: 59 | Attending: Adult Health | Admitting: Adult Health

## 2018-06-18 ENCOUNTER — Encounter: Payer: Self-pay | Admitting: Adult Health

## 2018-06-18 VITALS — BP 121/90 | HR 96 | Temp 97.7°F | Resp 18 | Ht 65.0 in | Wt 154.9 lb

## 2018-06-18 DIAGNOSIS — Z170 Estrogen receptor positive status [ER+]: Secondary | ICD-10-CM | POA: Diagnosis not present

## 2018-06-18 DIAGNOSIS — Z9013 Acquired absence of bilateral breasts and nipples: Secondary | ICD-10-CM | POA: Diagnosis not present

## 2018-06-18 DIAGNOSIS — Z9221 Personal history of antineoplastic chemotherapy: Secondary | ICD-10-CM | POA: Diagnosis not present

## 2018-06-18 DIAGNOSIS — C50412 Malignant neoplasm of upper-outer quadrant of left female breast: Secondary | ICD-10-CM

## 2018-06-18 DIAGNOSIS — Z853 Personal history of malignant neoplasm of breast: Secondary | ICD-10-CM | POA: Insufficient documentation

## 2018-06-18 NOTE — Telephone Encounter (Signed)
No 6/27 los.

## 2018-06-18 NOTE — Progress Notes (Signed)
CLINIC:  Survivorship   REASON FOR VISIT:  Routine follow-up for history of breast cancer.   BRIEF ONCOLOGIC HISTORY:    Breast cancer, left breast (Jane Gutierrez) (Resolved)    Initial Diagnosis    Breast cancer, left breast      12/04/2012 Initial Biopsy    Left breast needle core biopsy (UOQ): Grade 3, DCIS with necrosis and calcs. ER+ (100%), PR+ (86%).        Breast cancer of upper-outer quadrant of left female breast (Jane Gutierrez)   12/04/2012 Initial Biopsy    LEFT breast needle core biopsy (UOQ): Grade 3, DCIS with necrosis and calcs. ER+ (100%), PR+ (86%).       12/14/2012 Breast MRI    LEFT breast: Clumped nodular enhancement measuring 7.2 x 3.2 x 2.6 cm. Also associated post-biopsy change. RIGHT breast: At 12 o'clock location, there is 74m slightly irregular mass-like area of abnml enhancement. No lymphadenopathy.       12/25/2012 Initial Biopsy    RIGHT breast needle core biopsy (upper centeral): Grade 1-2, IDC, DCIS with calcs. ER+ (100%), PR+ (100%), HER2- (ratio 1.23). Ki67 4%.       12/25/2012 Initial Diagnosis    Bilateral breast cancer      12/31/2012 Surgery    Bilateral mastectomies with bilat SLNB (Jane Gutierrez. LEFT: Grade 2, IDC, 2.3 cm. (+) LVI. Grade 3 DCIS w/comedonecrosis & calcs. (-) margins. 1 left ax SLN neg.  ER+ (99%), PR+ (10%), HER2 repeated and (+) ratio (6.81). Ki67 46%. RIGHT: Benign, 1 SLN neg.      12/31/2012 Pathologic Stage    LEFT breast: pT2, pN0, pMx. Stage IIA.       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 EF 55-60%      02/04/2013 - 05/13/2013 Adjuvant Chemotherapy    Taxotere/Carbo/Herceptin q 3 wks x 5 cycles completed. (planned for 6 cycles but d/c'd early due to pt wishes and recent infection).       05/2013 -  Anti-estrogen oral therapy    Attempted to take Letrozole, Aromasin, & Tamoxifen but patient could not tolerate any anti-estrogen  therapy due to severe depressive symptoms and joint/muscle aches & pains.  No longer taking anti-estrogen therapy.      09/29/2013 Surgery    LEFT breast reconstruction with lat flap and placement of tissue expander (Jane Gutierrez).       11/26/2013 Imaging    CT chest: No definite CT findings for chest wall recurrence and no suspicious supraclavicular or axillary adenopathy.  Negative for metastatic disease in lungs or bones.        - 02/16/2014 Adjuvant Chemotherapy    Herceptin maintenance x 1 year completed.       03/09/2014 Surgery    Removal of bilateral tissue expanders and placement of bilateral breast implants (Jane Gutierrez).       03/09/2014 Procedure    Right scar biopsy: No malignancy & Left breast capsule biopsy: No evidence of malignancy.       08/22/2014 Echocardiogram    Post-treatment. EF 55-60%      06/30/2015 Survivorship    Survivorship Care Plan given to patient and reviewed with her during in-person visit.         INTERVAL HISTORY:  [name] to the Survivorship Clinic today for routine follow-up for her history of breast cancer.  Overall, she reports feeling quite well.  She underwent implant replacement by Dr.  Dillingham in December of 2018.  She recovered from that well.    Jane Gutierrez is up to date with her health maintenance and has continued to see her PCP, gyn, and undergo colonoscopy when due, along with skin examination.  Jane Gutierrez denies any new issues since her last visit with [place].  She continues to exercise regularly.    REVIEW OF SYSTEMS:  Review of Systems  Constitutional: Negative for appetite change, chills, fatigue, fever and unexpected weight change.  HENT:   Negative for hearing loss, lump/mass, sore throat, trouble swallowing and voice change.   Eyes: Negative for eye problems and icterus.  Respiratory: Negative for chest tightness, cough and shortness of breath.   Cardiovascular: Negative for chest pain, leg swelling and palpitations.    Gastrointestinal: Negative for abdominal distention, abdominal pain, constipation, diarrhea, nausea and vomiting.  Endocrine: Negative for hot flashes.  Skin: Negative for itching and rash.  Neurological: Negative for dizziness, extremity weakness, headaches and numbness.  Hematological: Negative for adenopathy. Does not bruise/bleed easily.  Psychiatric/Behavioral: Negative for depression. The patient is not nervous/anxious.   Breast: Denies any new nodularity, masses, tenderness, nipple changes, or nipple discharge.     PAST MEDICAL/SURGICAL HISTORY:  Past Medical History:  Diagnosis Date  . Abnormally small mouth   . ADD (attention deficit disorder)   . ADHD   . Allergy   . Anxiety    pt reported she doesn't have anxiety  . Asthma    prn inhaler  . Blood transfusion without reported diagnosis   . Breast cancer ([place])   . Dental crowns present   . Difficulty swallowing pills   . History of breast cancer   . History of cervical cancer   . History of palpitations    last echo 08/22/2014  . Hypothyroidism   . IBS (irritable bowel syndrome)    Past Surgical History:  Procedure Laterality Date  . ABDOMINAL HYSTERECTOMY    . ABDOMINOPLASTY  03/28/2004  . AXILLARY SENTINEL NODE BIOPSY  12/31/2012   Procedure: AXILLARY SENTINEL NODE BIOPSY;  Surgeon: Jane Bookbinder, MD;  Location: [facility];  Service: General;  Laterality: Bilateral;  bilateral sentinel node  . BRAIN SURGERY     removal benign frontal lobe cyst in 1987  . BREAST ENHANCEMENT SURGERY Bilateral ~ 2008  . BREAST IMPLANT EXCHANGE Bilateral 12/11/2017   Procedure: BILATERAL IMPLANT EXCHANGE;  Surgeon: Jane Going, DO;  Location: [facility];  Service: Plastics;  Laterality: Bilateral;  . BREAST RECONSTRUCTION WITH PLACEMENT OF TISSUE EXPANDER AND FLEX HD (ACELLULAR HYDRATED DERMIS)  12/31/2012   Procedure: BREAST RECONSTRUCTION WITH PLACEMENT OF TISSUE EXPANDER AND FLEX HD  (ACELLULAR HYDRATED DERMIS);  Surgeon: Jane Kos, DO;  Location: [facility];  Service: Plastics;  Laterality: Bilateral;  bilateral immediate breast reconstruction with expanders and flex hd   . CAPSULOTOMY Left 09/01/2014   Procedure: CAPSULOTOMY OF LEFT BREAST WITH LIPOFILLING FOR SYMMETRY;  Surgeon: Jane Kos, DO;  Location: [place];  Service: Plastics;  Laterality: Left;  . CERVICAL CERCLAGE    . DIAGNOSTIC LAPAROSCOPY  05/12/2003   right partial salpingectomy; lysis paraovarian adhesions left  . DILATION AND CURETTAGE OF UTERUS  2005  . ENDOMETRIAL FULGURATION  05/12/2003  . FACIAL COSMETIC SURGERY  ~ 2010  . FOOT SURGERY Bilateral 07/2012  . GYNECOLOGIC CRYOSURGERY  1998  . INCISION AND DRAINAGE OF WOUND Left 04/28/2013   Procedure: IRRIGATION AND DEBRIDEMENT LEFT BREAST WOUND, POSSIBLE CLOSURE OF WOUND WITH  DRAIN PLACEMENT;  Surgeon: Jane Kos, DO;  Location: [facility];  Service: Plastics;  Laterality: Left;  . LATISSIMUS FLAP TO BREAST Left 09/29/2013   Procedure: LATISSIMUS FLAP TO BREAST WITH TISSUE EXPANDER PLACEMENT FOR LEFT BREAST RECONSTRUCTION;  Surgeon: Jane Kos, DO;  Location: [place];  Service: Plastics;  Laterality: Left;  . LIPECTOMY Bilateral 03/28/2004   hip  . LIPOSUCTION Bilateral 03/09/2014   Procedure: LIPOSUCTION ABDOMEN W/ LIPOFILLING LATERAL BILATERAL BREAST;  Surgeon: Jane Kos, DO;  Location: [facility];  Service: Plastics;  Laterality: Bilateral;  LIPOSUCTION NECK  . LIPOSUCTION WITH LIPOFILLING Bilateral 09/01/2014   Procedure: LIPOSUCTION WITH LIPOFILLING;  Surgeon: Jane Kos, DO;  Location: [place];  Service: Plastics;  Laterality: Bilateral;  . LYSIS OF ADHESION  03/28/2004   pelvic  . MASTECTOMY    . PORT-A-CATH REMOVAL Right 03/09/2014   Procedure: REMOVAL PORT-A-CATH;  Surgeon: Jane Kos, DO;  Location: [facility];  Service: Plastics;   Laterality: Right;  . PORTACATH PLACEMENT  01/27/2013   Procedure: INSERTION PORT-A-CATH;  Surgeon: Jane Bookbinder, MD;  Location: [facility];  Service: General;  Laterality: Right;  . RADIOFREQUENCY ABLATION NERVES  05/12/2003   uterosacral nerve ablation  . REMOVAL OF BILATERAL TISSUE EXPANDERS WITH PLACEMENT OF BILATERAL BREAST IMPLANTS Bilateral 03/09/2014   Procedure: REMOVAL OF BILATERAL TISSUE EXPANDERS WITH PLACEMENT OF BILATERAL BREAST IMPLANTS;  Surgeon: Jane Kos, DO;  Location: [facility];  Service: Plastics;  Laterality: Bilateral;  . TEE WITHOUT CARDIOVERSION N/A 06/04/2013   Procedure: TRANSESOPHAGEAL ECHOCARDIOGRAM (TEE);  Surgeon: Jane Artist, MD;  Location: [facility];  Service: Cardiovascular;  Laterality: N/A;  . TISSUE EXPANDER PLACEMENT Left 06/16/2013   Procedure: LEFT BREAST TISSUE EXPANDER REMOVAL;  Surgeon: Jane Kos, DO;  Location: [facility];  Service: Plastics;  Laterality: Left;  . TISSUE EXPANDER PLACEMENT Left 09/29/2013   Procedure: TISSUE EXPANDER;  Surgeon: Jane Kos, DO;  Location: [place];  Service: Plastics;  Laterality: Left;  . TOTAL ABDOMINAL HYSTERECTOMY W/ BILATERAL SALPINGOOPHORECTOMY  03/28/2004  . TOTAL MASTECTOMY  12/31/2012   Procedure: TOTAL MASTECTOMY;  Surgeon: Jane Bookbinder, MD;  Location: [facility];  Service: General;  Laterality: Bilateral;  bilateral total mastectomies     ALLERGIES:  Allergies  Allergen Reactions  . Betadine [Povidone Iodine] Anaphylaxis  . Iodine Anaphylaxis  . Shellfish-Derived Products Anaphylaxis  . Cleocin [Clindamycin Hcl] Rash  . Ginger Rash  . Penicillins Rash  . Sulfamethoxazole-Trimethoprim Rash     CURRENT MEDICATIONS:  Outpatient Encounter Medications as of 06/18/2018  Medication Sig Note  . albuterol (PROVENTIL) (2.5 MG/3ML) 0.083% nebulizer solution Take 2.5 mg by nebulization every 6 (six) hours as needed for wheezing or  shortness of breath.   . B Complex-C (B-COMPLEX WITH VITAMIN C) tablet Take 1 tablet by mouth daily.   . Biotin 1 MG CAPS Take 1 capsule by mouth daily.   Jane Gutierrez Kitchen dextroamphetamine (DEXTROSTAT) 5 MG tablet Take 5 mg by mouth 4 (four) times daily as needed.    . doxycycline (VIBRA-TABS) 100 MG tablet Take 1 tablet (100 mg total) by mouth 2 (two) times daily.   Jane Gutierrez Kitchen EPINEPHrine 0.3 mg/0.3 mL IJ SOAJ injection Inject 0.3 mLs (0.3 mg total) into the muscle once.   . Eszopiclone 3 MG TABS TAKE 1 TABLET BY MOUTH IMMEDIATELY BEFORE BEDTIME 04/06/2018: Reports she takes 1/2 a tablet  . hyoscyamine (ANASPAZ) 0.125 MG TBDP disintergrating tablet Place 0.125 mg  under the tongue as needed.    . Multiple Vitamin (MULTIVITAMIN WITH MINERALS) TABS Take 1 tablet by mouth daily.   Jane Gutierrez Kitchen SYNTHROID 100 MCG tablet TAKE 1 TABLET BY MOUTH DAILY.   . valACYclovir (VALTREX) 500 MG tablet Take 250 mg by mouth daily.   . vitamin E 600 UNIT capsule Take 800 Units by mouth daily.  04/06/2018: Reports she only takes '400mg'$  bid    No facility-administered encounter medications on file as of 06/18/2018.      ONCOLOGIC FAMILY HISTORY:  Family History  Problem Relation Age of Onset  . Cancer Mother 52       uterine, mult myeloma  . CVA Father   . Cancer Sister 39       breast  . Cancer Maternal Grandmother 63       breast  . Lupus Sister        twin sister  . Sarcoidosis Sister        twin sister  . Rheum arthritis Sister        twin sister  . Celiac disease Daughter       SOCIAL HISTORY:  Social History   Socioeconomic History  . Marital status: Married    Spouse name: Not on file  . Number of children: Not on file  . Years of education: Not on file  . Highest education level: Not on file  Occupational History  . Not on file  Social Needs  . Financial resource strain: Not on file  . Food insecurity:    Worry: Not on file    Inability: Not on file  . Transportation needs:    Medical: Not on file     Non-medical: Not on file  Tobacco Use  . Smoking status: Never Smoker  . Smokeless tobacco: Never Used  Substance and Sexual Activity  . Alcohol use: No    Alcohol/week: 0.0 oz    Frequency: Never    Comment: occasionally  . Drug use: No  . Sexual activity: Yes  Lifestyle  . Physical activity:    Days per week: Not on file    Minutes per session: Not on file  . Stress: Not on file  Relationships  . Social connections:    Talks on phone: Not on file    Gets together: Not on file    Attends religious service: Not on file    Active member of club or organization: Not on file    Attends meetings of clubs or organizations: Not on file    Relationship status: Not on file  . Intimate partner violence:    Fear of current or ex partner: Not on file    Emotionally abused: Not on file    Physically abused: Not on file    Forced sexual activity: Not on file  Other Topics Concern  . Not on file  Social History Narrative  . Not on file     PHYSICAL EXAMINATION:  Vital Signs: Vitals:   06/18/18 0835  BP: 121/90  Pulse: 96  Resp: 18  Temp: 97.7 F (36.5 C)  SpO2: 100%   Filed Weights   06/18/18 0835  Weight: 154 lb 14.4 oz (70.3 kg)   General: Well-nourished, well-appearing female in no acute distress.  Unaccompanied today.   HEENT: Head is normocephalic.  Pupils equal and reactive to light. Conjunctivae clear without exudate.  Sclerae anicteric. Oral mucosa is pink, moist.  Oropharynx is pink without lesions or erythema.  Lymph: No cervical, supraclavicular,  or infraclavicular lymphadenopathy noted on palpation.  Cardiovascular: Regular rate and rhythm.Jane Gutierrez Kitchen Respiratory: Clear to auscultation bilaterally. Chest expansion symmetric; breathing non-labored.  Breast Exam:  Breasts: s/p bilateral mastectomies and implant placement.  No nodules, masses, sign of recurrence -Axilla: No axillary adenopathy bilaterally.  GI: Abdomen soft and round; non-tender, non-distended. Bowel  sounds normoactive. No hepatosplenomegaly.   GU: Deferred.  Neuro: No focal deficits. Steady gait.  Psych: Mood and affect normal and appropriate for situation.  MSK: No focal spinal tenderness to palpation, full range of motion in bilateral upper extremities Extremities: No edema. Skin: Warm and dry.  LABORATORY DATA:  None for this visit   DIAGNOSTIC IMAGING:  Most recent mammogram: n/a s/p bilateral mastectomies    ASSESSMENT AND PLAN:  Ms.. Jane Gutierrez is a pleasant 62 y.o. female with history of Stage IIA left breast invasive ductal carcinoma, ER+/PR+/HER2+, diagnosed in 11/2012, treated with bilateral mastectomies, adjuvant chemotherapy, and maintenance trastuzumab.  She was unable to tolerate antiestrogen therapy.  She presents to the Survivorship Clinic for surveillance and routine follow-up.   1. History of breast cancer:  [name] is currently clinically and radiographically without evidence of disease or recurrence of breast cancer.  I encouraged her to call me with any questions or concerns before her next visit at the cancer center, and I would be happy to see her sooner, if needed.    2. Bone health:  Given [name] age, history of breast cancer, she is at risk for bone demineralization. I will defer to her PCP regarding future bone density testing and management.  She was given education on specific food and activities to promote bone health.  3. Cancer screening:  Due to [name] history and her age, she should receive screening for skin cancers, colon cancer (more frequent due to Jane Gutierrez 2 positivity), and gynecologic cancers. She was encouraged to follow-up with her PCP for appropriate cancer screenings.   4. Health maintenance and wellness promotion: [name] was encouraged to consume 5-7 servings of fruits and vegetables per day. She was also encouraged to engage in moderate to vigorous exercise for 30 minutes per day most days of the week. She was instructed to limit  her alcohol consumption and continue to abstain from tobacco use.      Dispo:  -Return to cancer center in 6 months for LTS follow up   A total of (30) minutes of face-to-face time was spent with this patient with greater than 50% of that time in counseling and care-coordination.   Jane Phlegm, NP Survivorship Program [facility] [phone]   Note: PRIMARY CARE PROVIDER Jane Gutierrez, [place] [phone]

## 2018-06-19 ENCOUNTER — Telehealth: Payer: Self-pay | Admitting: Family Medicine

## 2018-06-19 DIAGNOSIS — L03019 Cellulitis of unspecified finger: Secondary | ICD-10-CM

## 2018-06-19 MED ORDER — FLUCONAZOLE 100 MG PO TABS: 100.0000 mg | ORAL_TABLET | Freq: Every day | ORAL | 0 refills | Status: AC

## 2018-06-19 MED ORDER — MUPIROCIN 2 % EX OINT: TOPICAL_OINTMENT | CUTANEOUS | 0 refills | Status: DC

## 2018-06-19 NOTE — Telephone Encounter (Signed)
Per provider called patient - LMOVM in detail advising patient to discontinue Doxcycline immediately and begin taking Benadryl 25 mg orally every 4-6 as needed for the next 24-48 hours; needs to follow up in the 24-48 at no charge to see how the nail is doing; make sure to follow up with Dermatology as well.  Advised new Rxs:  1:  Bactroban 2% apply to affected nail twice a day for the next 7 days 2: Diflucan 100 mg 1 tablet by mouth at bedtime for the next 7 days

## 2018-06-24 ENCOUNTER — Other Ambulatory Visit: Payer: Self-pay

## 2018-06-24 MED ORDER — ESZOPICLONE 3 MG PO TABS: ORAL_TABLET | ORAL | 5 refills | Status: DC

## 2018-06-24 NOTE — Progress Notes (Signed)
Pt called to request refill on her lunesta for sleep. She had ran out of refills at [facility].   Per Dr.Gudena, okay to send new script for lunesta to her pharmacy. Will fax refill to [facility] today.

## 2018-06-26 MED FILL — ESZOPICLONE 3 MG TABS: 3 | 30 days supply | Qty: 30 | Fill #0 | Status: TO

## 2018-06-29 ENCOUNTER — Other Ambulatory Visit: Payer: Self-pay | Admitting: Hematology and Oncology

## 2018-06-29 MED FILL — SYNTHROID 100 MCG TABLET: 100 | 90 days supply | Qty: 90 | Fill #1

## 2018-07-06 DIAGNOSIS — Z01419 Encounter for gynecological examination (general) (routine) without abnormal findings: Secondary | ICD-10-CM | POA: Diagnosis not present

## 2018-07-06 DIAGNOSIS — Z6826 Body mass index (BMI) 26.0-26.9, adult: Secondary | ICD-10-CM | POA: Diagnosis not present

## 2018-07-06 MED FILL — VALACYCLOVIR HCL 500 MG TAB: 500 | 90 days supply | Qty: 90 | Fill #0

## 2018-07-06 MED FILL — NITROFURANTOIN MCR 100 MG C: 100 | 30 days supply | Qty: 30 | Fill #0

## 2018-07-06 MED FILL — PHENAZOPYRIDINE 100 MG TAB: 100 | 10 days supply | Qty: 30 | Fill #0

## 2018-07-06 MED FILL — FLUCONAZOLE 150 MG TABS: 150 | 1 days supply | Qty: 1 | Fill #0 | Status: TO

## 2018-07-07 MED FILL — DEXTROAMPHETAMINE 10 MG TAB: 10 | 90 days supply | Qty: 270 | Fill #0

## 2018-07-09 DIAGNOSIS — L03011 Cellulitis of right finger: Secondary | ICD-10-CM | POA: Diagnosis not present

## 2018-07-09 DIAGNOSIS — L821 Other seborrheic keratosis: Secondary | ICD-10-CM | POA: Diagnosis not present

## 2018-07-09 DIAGNOSIS — L080 Pyoderma: Secondary | ICD-10-CM | POA: Diagnosis not present

## 2018-07-09 DIAGNOSIS — B351 Tinea unguium: Secondary | ICD-10-CM | POA: Diagnosis not present

## 2018-07-09 MED FILL — KETOCONAZOLE 2% CREAM: 2 | 30 days supply | Qty: 60 | Fill #0

## 2018-07-09 MED FILL — CEPHALEXIN 500 MG CAPSULE: 500 | 14 days supply | Qty: 28 | Fill #0

## 2018-08-06 DIAGNOSIS — B351 Tinea unguium: Secondary | ICD-10-CM | POA: Diagnosis not present

## 2018-08-06 DIAGNOSIS — L03011 Cellulitis of right finger: Secondary | ICD-10-CM | POA: Diagnosis not present

## 2018-08-06 DIAGNOSIS — L820 Inflamed seborrheic keratosis: Secondary | ICD-10-CM | POA: Diagnosis not present

## 2018-08-11 DIAGNOSIS — H5213 Myopia, bilateral: Secondary | ICD-10-CM | POA: Diagnosis not present

## 2018-08-27 DIAGNOSIS — J029 Acute pharyngitis, unspecified: Secondary | ICD-10-CM | POA: Diagnosis not present

## 2018-08-27 MED FILL — FLUCONAZOLE 150 MG TABS: 150 | 1 days supply | Qty: 1 | Fill #0

## 2018-08-27 MED FILL — CEPHALEXIN 500 MG CAPSULE: 500 | 10 days supply | Qty: 20 | Fill #0

## 2018-08-27 MED FILL — ESZOPICLONE 3 MG TABS: 3 | 30 days supply | Qty: 30 | Fill #0

## 2018-08-31 DIAGNOSIS — B351 Tinea unguium: Secondary | ICD-10-CM | POA: Diagnosis not present

## 2018-08-31 DIAGNOSIS — L820 Inflamed seborrheic keratosis: Secondary | ICD-10-CM | POA: Diagnosis not present

## 2018-08-31 DIAGNOSIS — Z85828 Personal history of other malignant neoplasm of skin: Secondary | ICD-10-CM | POA: Diagnosis not present

## 2018-08-31 DIAGNOSIS — D225 Melanocytic nevi of trunk: Secondary | ICD-10-CM | POA: Diagnosis not present

## 2018-09-22 MED FILL — SYNTHROID 100 MCG TABLET: 100 | 90 days supply | Qty: 90 | Fill #2

## 2018-10-06 MED FILL — ESZOPICLONE 3 MG TABS: 3 | 30 days supply | Qty: 30 | Fill #0

## 2018-11-03 MED FILL — DEXTROAMPHETAMINE 10 MG TAB: 10 | 90 days supply | Qty: 270 | Fill #0

## 2018-11-12 DIAGNOSIS — M1990 Unspecified osteoarthritis, unspecified site: Secondary | ICD-10-CM | POA: Diagnosis not present

## 2018-11-12 DIAGNOSIS — L251 Unspecified contact dermatitis due to drugs in contact with skin: Secondary | ICD-10-CM | POA: Diagnosis not present

## 2018-11-12 DIAGNOSIS — M25551 Pain in right hip: Secondary | ICD-10-CM | POA: Diagnosis not present

## 2018-11-12 DIAGNOSIS — Z6826 Body mass index (BMI) 26.0-26.9, adult: Secondary | ICD-10-CM | POA: Diagnosis not present

## 2018-11-12 MED FILL — predniSONE 10 MG (21) TBPK: 10 | 6 days supply | Qty: 21 | Fill #0

## 2018-11-24 DIAGNOSIS — N952 Postmenopausal atrophic vaginitis: Secondary | ICD-10-CM | POA: Diagnosis not present

## 2018-11-24 DIAGNOSIS — Z853 Personal history of malignant neoplasm of breast: Secondary | ICD-10-CM | POA: Diagnosis not present

## 2018-12-09 ENCOUNTER — Inpatient Hospital Stay: Payer: 59 | Attending: Adult Health | Admitting: Adult Health

## 2018-12-09 ENCOUNTER — Encounter: Payer: Self-pay | Admitting: Adult Health

## 2018-12-09 ENCOUNTER — Telehealth: Payer: Self-pay | Admitting: Adult Health

## 2018-12-09 VITALS — BP 125/86 | HR 86 | Temp 97.9°F | Resp 18 | Ht 65.0 in | Wt 155.8 lb

## 2018-12-09 DIAGNOSIS — R6881 Early satiety: Secondary | ICD-10-CM | POA: Insufficient documentation

## 2018-12-09 DIAGNOSIS — Z9013 Acquired absence of bilateral breasts and nipples: Secondary | ICD-10-CM | POA: Diagnosis not present

## 2018-12-09 DIAGNOSIS — Z853 Personal history of malignant neoplasm of breast: Secondary | ICD-10-CM | POA: Diagnosis not present

## 2018-12-09 DIAGNOSIS — Z90722 Acquired absence of ovaries, bilateral: Secondary | ICD-10-CM | POA: Diagnosis not present

## 2018-12-09 DIAGNOSIS — C50412 Malignant neoplasm of upper-outer quadrant of left female breast: Secondary | ICD-10-CM

## 2018-12-09 DIAGNOSIS — Z9079 Acquired absence of other genital organ(s): Secondary | ICD-10-CM | POA: Insufficient documentation

## 2018-12-09 DIAGNOSIS — Z90710 Acquired absence of both cervix and uterus: Secondary | ICD-10-CM | POA: Diagnosis not present

## 2018-12-09 DIAGNOSIS — Z170 Estrogen receptor positive status [ER+]: Secondary | ICD-10-CM | POA: Diagnosis not present

## 2018-12-09 DIAGNOSIS — Z9221 Personal history of antineoplastic chemotherapy: Secondary | ICD-10-CM | POA: Insufficient documentation

## 2018-12-09 DIAGNOSIS — R110 Nausea: Secondary | ICD-10-CM | POA: Insufficient documentation

## 2018-12-09 NOTE — Progress Notes (Signed)
CLINIC:  Survivorship   REASON FOR VISIT:  Routine follow-up for history of breast cancer.   BRIEF ONCOLOGIC HISTORY:    Breast cancer, left breast ([place]) (Resolved)    Initial Diagnosis    Breast cancer, left breast    12/04/2012 Initial Biopsy    Left breast needle core biopsy (UOQ): Grade 3, DCIS with necrosis and calcs. ER+ (100%), PR+ (86%).      Breast cancer of upper-outer quadrant of left female breast ([place])   12/04/2012 Initial Biopsy    LEFT breast needle core biopsy (UOQ): Grade 3, DCIS with necrosis and calcs. ER+ (100%), PR+ (86%).     12/14/2012 Breast MRI    LEFT breast: Clumped nodular enhancement measuring 7.2 x 3.2 x 2.6 cm. Also associated post-biopsy change. RIGHT breast: At 12 o'clock location, there is 26m slightly irregular mass-like area of abnml enhancement. No lymphadenopathy.     12/25/2012 Initial Biopsy    RIGHT breast needle core biopsy (upper centeral): Grade 1-2, IDC, DCIS with calcs. ER+ (100%), PR+ (100%), HER2- (ratio 1.23). Ki67 4%.     12/25/2012 Initial Diagnosis    Bilateral breast cancer    12/31/2012 Surgery    Bilateral mastectomies with bilat SLNB ([facility]. LEFT: Grade 2, IDC, 2.3 cm. (+) LVI. Grade 3 DCIS w/comedonecrosis & calcs. (-) margins. 1 left ax SLN neg.  ER+ (99%), PR+ (10%), HER2 repeated and (+) ratio (6.81). Ki67 46%. RIGHT: Benign, 1 SLN neg.    12/31/2012 Pathologic Stage    LEFT breast: pT2, pN0, pMx. Stage IIA.     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 EF 55-60%    02/04/2013 - 05/13/2013 Adjuvant Chemotherapy    Taxotere/Carbo/Herceptin q 3 wks x 5 cycles completed. (planned for 6 cycles but d/c'd early due to pt wishes and recent infection).     05/2013 -  Anti-estrogen oral therapy    Attempted to take Letrozole, Aromasin, & Tamoxifen but patient could not tolerate any anti-estrogen therapy due to severe depressive  symptoms and joint/muscle aches & pains.  No longer taking anti-estrogen therapy.    09/29/2013 Surgery    LEFT breast reconstruction with lat flap and placement of tissue expander ([facility]).     11/26/2013 Imaging    CT chest: No definite CT findings for chest wall recurrence and no suspicious supraclavicular or axillary adenopathy.  Negative for metastatic disease in lungs or bones.      - 02/16/2014 Adjuvant Chemotherapy    Herceptin maintenance x 1 year completed.     03/09/2014 Surgery    Removal of bilateral tissue expanders and placement of bilateral breast implants ([facility]).     03/09/2014 Procedure    Right scar biopsy: No malignancy & Left breast capsule biopsy: No evidence of malignancy.     08/22/2014 Echocardiogram    Post-treatment. EF 55-60%    06/30/2015 Survivorship    Survivorship Care Plan given to patient and reviewed with her during in-person visit.       INTERVAL HISTORY:  Jane Gutierrez to the Survivorship Clinic today for routine follow-up for her history of breast cancer.  Overall, she reports feeling quite well.  She continues to see her PCP regularly.  She does have some early satiety and occasional nausea.  She is going to f/u with her gastroenterologist about this next month.  She has chek 2 positivity and notes her colonoscopy is due in 2020.  She continues to have pap smears.  She had some joint pain and neuropathy in her feet that resolved by drinking banana tea She exercises 5-7 days a week.  She is feeling well otherwise, and has no questions or concerns today.      REVIEW OF SYSTEMS:  Review of Systems  Constitutional: Negative for appetite change, chills, fatigue, fever and unexpected weight change.  HENT:   Negative for hearing loss, lump/mass, nosebleeds, sore throat, tinnitus and trouble swallowing.   Eyes: Negative for eye problems and icterus.  Respiratory: Negative for chest tightness, cough and shortness of breath.   Cardiovascular:  Negative for chest pain, leg swelling and palpitations.  Gastrointestinal: Positive for nausea. Negative for abdominal distention, abdominal pain, blood in stool, constipation, diarrhea and vomiting.  Endocrine: Negative for hot flashes.  Musculoskeletal: Negative for arthralgias.  Skin: Negative for itching and rash.  Neurological: Negative for dizziness, extremity weakness and numbness.  Hematological: Negative for adenopathy. Does not bruise/bleed easily.  Psychiatric/Behavioral: Negative for depression. The patient is not nervous/anxious.   Breast: Denies any new nodularity, masses, tenderness, nipple changes, or nipple discharge.     PAST MEDICAL/SURGICAL HISTORY:  Past Medical History:  Diagnosis Date  . Abnormally small mouth   . ADD (attention deficit disorder)   . ADHD   . Allergy   . Anxiety    pt reported she doesn't have anxiety  . Asthma    prn inhaler  . Blood transfusion without reported diagnosis   . Breast cancer ([place])   . Dental crowns present   . Difficulty swallowing pills   . History of breast cancer   . History of cervical cancer   . History of palpitations    last echo 08/22/2014  . Hypothyroidism   . IBS (irritable bowel syndrome)    Past Surgical History:  Procedure Laterality Date  . ABDOMINAL HYSTERECTOMY    . ABDOMINOPLASTY  03/28/2004  . AXILLARY SENTINEL NODE BIOPSY  12/31/2012   Procedure: AXILLARY SENTINEL NODE BIOPSY;  Surgeon: Rolm Bookbinder, MD;  Location: [place];  Service: General;  Laterality: Bilateral;  bilateral sentinel node  . BRAIN SURGERY     removal benign frontal lobe cyst in 1987  . BREAST ENHANCEMENT SURGERY Bilateral ~ 2008  . BREAST IMPLANT EXCHANGE Bilateral 12/11/2017   Procedure: BILATERAL IMPLANT EXCHANGE;  Surgeon: Wallace Going, DO;  Location: [place];  Service: Plastics;  Laterality: Bilateral;  . BREAST RECONSTRUCTION WITH PLACEMENT OF TISSUE EXPANDER AND FLEX HD (ACELLULAR  HYDRATED DERMIS)  12/31/2012   Procedure: BREAST RECONSTRUCTION WITH PLACEMENT OF TISSUE EXPANDER AND FLEX HD (ACELLULAR HYDRATED DERMIS);  Surgeon: Theodoro Kos, DO;  Location: [facility];  Service: Plastics;  Laterality: Bilateral;  bilateral immediate breast reconstruction with expanders and flex hd   . CAPSULOTOMY Left 09/01/2014   Procedure: CAPSULOTOMY OF LEFT BREAST WITH LIPOFILLING FOR SYMMETRY;  Surgeon: Theodoro Kos, DO;  Location: [facility];  Service: Plastics;  Laterality: Left;  . CERVICAL CERCLAGE    . DIAGNOSTIC LAPAROSCOPY  05/12/2003   right partial salpingectomy; lysis paraovarian adhesions left  . DILATION AND CURETTAGE OF UTERUS  2005  . ENDOMETRIAL FULGURATION  05/12/2003  . FACIAL COSMETIC SURGERY  ~ 2010  . FOOT SURGERY Bilateral 07/2012  . GYNECOLOGIC CRYOSURGERY  1998  . INCISION AND DRAINAGE OF WOUND Left 04/28/2013   Procedure: IRRIGATION AND DEBRIDEMENT LEFT BREAST WOUND, POSSIBLE CLOSURE OF WOUND WITH DRAIN PLACEMENT;  Surgeon: Theodoro Kos,  DO;  Location: [facility];  Service: Plastics;  Laterality: Left;  . LATISSIMUS FLAP TO BREAST Left 09/29/2013   Procedure: LATISSIMUS FLAP TO BREAST WITH TISSUE EXPANDER PLACEMENT FOR LEFT BREAST RECONSTRUCTION;  Surgeon: Theodoro Kos, DO;  Location: [place];  Service: Plastics;  Laterality: Left;  . LIPECTOMY Bilateral 03/28/2004   hip  . LIPOSUCTION Bilateral 03/09/2014   Procedure: LIPOSUCTION ABDOMEN W/ LIPOFILLING LATERAL BILATERAL BREAST;  Surgeon: Theodoro Kos, DO;  Location: [facility];  Service: Plastics;  Laterality: Bilateral;  LIPOSUCTION NECK  . LIPOSUCTION WITH LIPOFILLING Bilateral 09/01/2014   Procedure: LIPOSUCTION WITH LIPOFILLING;  Surgeon: Theodoro Kos, DO;  Location: [place];  Service: Plastics;  Laterality: Bilateral;  . LYSIS OF ADHESION  03/28/2004   pelvic  . MASTECTOMY    . PORT-A-CATH REMOVAL Right 03/09/2014   Procedure: REMOVAL  PORT-A-CATH;  Surgeon: Theodoro Kos, DO;  Location: [facility];  Service: Plastics;  Laterality: Right;  . PORTACATH PLACEMENT  01/27/2013   Procedure: INSERTION PORT-A-CATH;  Surgeon: Rolm Bookbinder, MD;  Location: [facility];  Service: General;  Laterality: Right;  . RADIOFREQUENCY ABLATION NERVES  05/12/2003   uterosacral nerve ablation  . REMOVAL OF BILATERAL TISSUE EXPANDERS WITH PLACEMENT OF BILATERAL BREAST IMPLANTS Bilateral 03/09/2014   Procedure: REMOVAL OF BILATERAL TISSUE EXPANDERS WITH PLACEMENT OF BILATERAL BREAST IMPLANTS;  Surgeon: Theodoro Kos, DO;  Location: [place];  Service: Plastics;  Laterality: Bilateral;  . TEE WITHOUT CARDIOVERSION N/A 06/04/2013   Procedure: TRANSESOPHAGEAL ECHOCARDIOGRAM (TEE);  Surgeon: Jolaine Artist, MD;  Location: [facility] ENDOSCOPY;  Service: Cardiovascular;  Laterality: N/A;  . TISSUE EXPANDER PLACEMENT Left 06/16/2013   Procedure: LEFT BREAST TISSUE EXPANDER REMOVAL;  Surgeon: Theodoro Kos, DO;  Location: [place];  Service: Plastics;  Laterality: Left;  . TISSUE EXPANDER PLACEMENT Left 09/29/2013   Procedure: TISSUE EXPANDER;  Surgeon: Theodoro Kos, DO;  Location: [place];  Service: Plastics;  Laterality: Left;  . TOTAL ABDOMINAL HYSTERECTOMY W/ BILATERAL SALPINGOOPHORECTOMY  03/28/2004  . TOTAL MASTECTOMY  12/31/2012   Procedure: TOTAL MASTECTOMY;  Surgeon: Rolm Bookbinder, MD;  Location: [place];  Service: General;  Laterality: Bilateral;  bilateral total mastectomies     ALLERGIES:  Allergies  Allergen Reactions  . Betadine [Povidone Iodine] Anaphylaxis  . Doxycycline Rash  . Iodine Anaphylaxis  . Shellfish-Derived Products Anaphylaxis  . Cleocin [Clindamycin Hcl] Rash  . Ginger Rash  . Penicillins Rash  . Sulfamethoxazole-Trimethoprim Rash     CURRENT MEDICATIONS:  Outpatient Encounter Medications as of 12/09/2018  Medication Sig Note  . albuterol  (PROVENTIL) (2.5 MG/3ML) 0.083% nebulizer solution Take 2.5 mg by nebulization every 6 (six) hours as needed for wheezing or shortness of breath.   . B Complex-C (B-COMPLEX WITH VITAMIN C) tablet Take 1 tablet by mouth daily.   . Biotin 1 MG CAPS Take 1 capsule by mouth daily.   [name] dextroamphetamine (DEXTROSTAT) 5 MG tablet Take 5 mg by mouth 4 (four) times daily as needed.    [name] EPINEPHrine 0.3 mg/0.3 mL IJ SOAJ injection Inject 0.3 mLs (0.3 mg total) into the muscle once.   . Eszopiclone 3 MG TABS TAKE 1 TABLET BY MOUTH IMMEDIATELY BEFORE BEDTIME (Patient taking differently: TAKE 1 TABLET BY MOUTH IMMEDIATELY BEFORE BEDTIME ( patient takes a half tab at bedtime, per patient))   . hyoscyamine (ANASPAZ) 0.125 MG TBDP disintergrating tablet Place 0.125 mg under the tongue as needed.    [name]  Multiple Vitamin (MULTIVITAMIN WITH MINERALS) TABS Take 1 tablet by mouth daily.   . mupirocin ointment (BACTROBAN) 2 % Apply to affected nail twice daily for 7 days.   [name] SYNTHROID 100 MCG tablet TAKE 1 TABLET BY MOUTH DAILY.   . valACYclovir (VALTREX) 500 MG tablet Take 250 mg by mouth daily.   . vitamin E 600 UNIT capsule Take 800 Units by mouth daily.  04/06/2018: Reports she only takes 430m bid    No facility-administered encounter medications on file as of 12/09/2018.      ONCOLOGIC FAMILY HISTORY:  Family History  Problem Relation Age of Onset  . Cancer Mother 771      uterine, mult myeloma  . CVA Father   . Cancer Sister 429      breast  . Cancer Maternal Grandmother [phone]      breast  . Lupus Sister        twin sister  . Sarcoidosis Sister        twin sister  . Rheum arthritis Sister        twin sister  . Celiac disease Daughter       SOCIAL HISTORY:  Social History   Socioeconomic History  . Marital status: Married    Spouse name: Not on file  . Number of children: Not on file  . Years of education: Not on file  . Highest education level: Not on file  Occupational History  . Not on  file  Social Needs  . Financial resource strain: Not on file  . Food insecurity:    Worry: Not on file    Inability: Not on file  . Transportation needs:    Medical: Not on file    Non-medical: Not on file  Tobacco Use  . Smoking status: Never Smoker  . Smokeless tobacco: Never Used  Substance and Sexual Activity  . Alcohol use: No    Alcohol/week: 0.0 standard drinks    Frequency: Never    Comment: occasionally  . Drug use: No  . Sexual activity: Yes  Lifestyle  . Physical activity:    Days per week: Not on file    Minutes per session: Not on file  . Stress: Not on file  Relationships  . Social connections:    Talks on phone: Not on file    Gets together: Not on file    Attends religious service: Not on file    Active member of club or organization: Not on file    Attends meetings of clubs or organizations: Not on file    Relationship status: Not on file  . Intimate partner violence:    Fear of current or ex partner: Not on file    Emotionally abused: Not on file    Physically abused: Not on file    Forced sexual activity: Not on file  Other Topics Concern  . Not on file  Social History Narrative  . Not on file     PHYSICAL EXAMINATION:  Vital Signs: Vitals:   12/09/18 0846  BP: 125/86  Pulse: 86  Resp: 18  Temp: 97.9 F (36.6 C)  SpO2: 100%   Filed Weights   12/09/18 0846  Weight: 155 lb 12.8 oz (70.7 kg)   General: Well-nourished, well-appearing female in no acute distress.  Unaccompanied today.   HEENT: Head is normocephalic.  Pupils equal and reactive to light. Conjunctivae clear without exudate.  Sclerae anicteric. Oral mucosa is pink, moist.  Oropharynx is pink  without lesions or erythema.  Lymph: No cervical, supraclavicular, or infraclavicular lymphadenopathy noted on palpation.  Cardiovascular: Regular rate and rhythm.[name] Respiratory: Clear to auscultation bilaterally. Chest expansion symmetric; breathing non-labored.  Breast Exam:  Breasts:  s/p bilateral mastectomies and implant placement.  No nodules, masses, sign of recurrence -Axilla: No axillary adenopathy bilaterally.  GI: Abdomen soft and round; non-tender, non-distended. Bowel sounds normoactive. No hepatosplenomegaly.   GU: Deferred.  Neuro: No focal deficits. Steady gait.  Psych: Mood and affect normal and appropriate for situation.  MSK: No focal spinal tenderness to palpation, full range of motion in bilateral upper extremities Extremities: No edema. Skin: Warm and dry.  LABORATORY DATA:  None for this visit   DIAGNOSTIC IMAGING:  Most recent mammogram: n/a s/p bilateral mastectomies    ASSESSMENT AND PLAN:  Ms.. Gutierrez is a pleasant 62 y.o. female with history of Stage IIA left breast invasive ductal carcinoma, ER+/PR+/HER2+, diagnosed in 11/2012, treated with bilateral mastectomies, adjuvant chemotherapy, and maintenance trastuzumab.  She was unable to tolerate antiestrogen therapy.  She presents to the Survivorship Clinic for surveillance and routine follow-up.   1. History of breast cancer:  Jane Gutierrez is currently clinically and radiographically without evidence of disease or recurrence of breast cancer.  She will return in 1 year for continue follow up and surveillance.  I encouraged her to call me with any questions or concerns before her next visit at the cancer center, and I would be happy to see her sooner, if needed.    2. Bone health:  Given Jane Gutierrez age, history of breast cancer, she is at risk for bone demineralization. I will defer to her PCP regarding future bone density testing and management.  She was given education on specific food and activities to promote bone health.  3. Cancer screening:  Due to Jane Gutierrez history and her age, she should receive screening for skin cancers, colon cancer (more frequent due to Chek 2 positivity), and gynecologic cancers. She was encouraged to follow-up with her PCP for appropriate cancer screenings.   4.  Health maintenance and wellness promotion: Jane Gutierrez was encouraged to consume 5-7 servings of fruits and vegetables per day. She was also encouraged to engage in moderate to vigorous exercise for 30 minutes per day most days of the week. She was instructed to limit her alcohol consumption and continue to abstain from tobacco use.    5.  Nausea and early satiety: To f/u with her gastroenterologist.  Dispo:  -Return to cancer center in 1 year for LTS follow up   A total of (20) minutes of face-to-face time was spent with this patient with greater than 50% of that time in counseling and care-coordination.   Gardenia Phlegm, NP Survivorship Program [facility] [phone]   Note: PRIMARY CARE PROVIDER Reynold Bowen, [place] [phone]

## 2018-12-09 NOTE — Telephone Encounter (Signed)
Gave avs and calendar ° °

## 2018-12-21 MED FILL — SYNTHROID 100 MCG TABLET: 100 | 90 days supply | Qty: 90 | Fill #0

## 2018-12-22 MED FILL — ESZOPICLONE 3 MG TABS: 3 | 30 days supply | Qty: 30 | Fill #0

## 2019-01-12 DIAGNOSIS — E038 Other specified hypothyroidism: Secondary | ICD-10-CM | POA: Diagnosis not present

## 2019-01-12 DIAGNOSIS — D126 Benign neoplasm of colon, unspecified: Secondary | ICD-10-CM | POA: Diagnosis not present

## 2019-01-12 DIAGNOSIS — C539 Malignant neoplasm of cervix uteri, unspecified: Secondary | ICD-10-CM | POA: Diagnosis not present

## 2019-01-12 DIAGNOSIS — Z0000 Encounter for general adult medical examination without abnormal findings: Secondary | ICD-10-CM | POA: Diagnosis not present

## 2019-01-12 DIAGNOSIS — C50919 Malignant neoplasm of unspecified site of unspecified female breast: Secondary | ICD-10-CM | POA: Diagnosis not present

## 2019-01-12 DIAGNOSIS — R82998 Other abnormal findings in urine: Secondary | ICD-10-CM | POA: Diagnosis not present

## 2019-01-12 DIAGNOSIS — E7849 Other hyperlipidemia: Secondary | ICD-10-CM | POA: Diagnosis not present

## 2019-01-12 DIAGNOSIS — I7300 Raynaud's syndrome without gangrene: Secondary | ICD-10-CM | POA: Insufficient documentation

## 2019-01-12 DIAGNOSIS — Z1331 Encounter for screening for depression: Secondary | ICD-10-CM | POA: Diagnosis not present

## 2019-01-12 DIAGNOSIS — M1990 Unspecified osteoarthritis, unspecified site: Secondary | ICD-10-CM | POA: Diagnosis not present

## 2019-01-12 DIAGNOSIS — F5104 Psychophysiologic insomnia: Secondary | ICD-10-CM | POA: Diagnosis not present

## 2019-02-08 MED FILL — DEXTROAMPHETAMINE 10 MG TAB: 10 | 90 days supply | Qty: 270 | Fill #0

## 2019-02-08 MED FILL — EPINEPHRINE 0.3 MG AUTO-INJ: 0.3 | 30 days supply | Qty: 2 | Fill #0

## 2019-02-08 MED FILL — VENTOLIN HFA 90 MCG INHALER: 108 (90 BAS | 30 days supply | Qty: 18 | Fill #0

## 2019-03-10 ENCOUNTER — Other Ambulatory Visit: Payer: Self-pay | Admitting: Hematology and Oncology

## 2019-03-10 MED FILL — SYNTHROID 100 MCG TABLET: 100 | 90 days supply | Qty: 90 | Fill #1

## 2019-03-11 ENCOUNTER — Telehealth: Payer: Self-pay

## 2019-03-11 MED FILL — ESZOPICLONE 3 MG TABS: 3 | 30 days supply | Qty: 30 | Fill #0

## 2019-03-11 NOTE — Telephone Encounter (Signed)
Eszopiclone (Lunesta) 3 mg tablets called in to [facility] with 2 refills.

## 2019-04-08 ENCOUNTER — Other Ambulatory Visit: Payer: Self-pay | Admitting: Oral Surgery

## 2019-04-08 DIAGNOSIS — B49 Unspecified mycosis: Secondary | ICD-10-CM | POA: Diagnosis not present

## 2019-04-08 DIAGNOSIS — K1120 Sialoadenitis, unspecified: Secondary | ICD-10-CM | POA: Diagnosis not present

## 2019-05-25 MED FILL — SYNTHROID 100 MCG TABLET: 100 | 90 days supply | Qty: 90 | Fill #2

## 2019-05-25 MED FILL — ESZOPICLONE 3 MG TABS: 3 | 30 days supply | Qty: 30 | Fill #1

## 2019-06-16 MED FILL — DEXTROAMPHETAMINE 10 MG TAB: 10 | 90 days supply | Qty: 270 | Fill #0

## 2019-07-12 DIAGNOSIS — Z6826 Body mass index (BMI) 26.0-26.9, adult: Secondary | ICD-10-CM | POA: Diagnosis not present

## 2019-07-12 DIAGNOSIS — Z01419 Encounter for gynecological examination (general) (routine) without abnormal findings: Secondary | ICD-10-CM | POA: Diagnosis not present

## 2019-07-12 MED FILL — valACYclovir HCL 1 GM TABS: 1 | 7 days supply | Qty: 15 | Fill #0

## 2019-07-12 MED FILL — FLUCONAZOLE 150 MG TABS: 150 | 6 days supply | Qty: 3 | Fill #0

## 2019-07-12 MED FILL — NITROFURANTOIN MONO-MCR 100: 100 | 5 days supply | Qty: 10 | Fill #0

## 2019-07-12 MED FILL — PHENAZOPYRIDINE 100 MG TAB: 100 | 10 days supply | Qty: 30 | Fill #0

## 2019-07-13 DIAGNOSIS — Z86010 Personal history of colonic polyps: Secondary | ICD-10-CM | POA: Diagnosis not present

## 2019-07-13 DIAGNOSIS — K580 Irritable bowel syndrome with diarrhea: Secondary | ICD-10-CM | POA: Diagnosis not present

## 2019-07-13 DIAGNOSIS — R140 Abdominal distension (gaseous): Secondary | ICD-10-CM | POA: Diagnosis not present

## 2019-07-13 MED FILL — OSCIMIN SL 0.125 MG TABLET: 0.125 | 15 days supply | Qty: 120 | Fill #0

## 2019-07-16 ENCOUNTER — Other Ambulatory Visit (HOSPITAL_COMMUNITY): Payer: Self-pay | Admitting: Internal Medicine

## 2019-07-16 ENCOUNTER — Other Ambulatory Visit: Payer: Self-pay | Admitting: Internal Medicine

## 2019-07-16 ENCOUNTER — Other Ambulatory Visit (HOSPITAL_COMMUNITY): Payer: Self-pay | Admitting: Obstetrics & Gynecology

## 2019-07-16 ENCOUNTER — Other Ambulatory Visit: Payer: Self-pay | Admitting: Obstetrics & Gynecology

## 2019-07-16 DIAGNOSIS — N6012 Diffuse cystic mastopathy of left breast: Secondary | ICD-10-CM

## 2019-07-23 ENCOUNTER — Other Ambulatory Visit: Payer: Self-pay | Admitting: Obstetrics & Gynecology

## 2019-07-23 DIAGNOSIS — N6012 Diffuse cystic mastopathy of left breast: Secondary | ICD-10-CM

## 2019-07-28 MED FILL — ESZOPICLONE 3 MG TABS: 3 | 30 days supply | Qty: 30 | Fill #2

## 2019-08-19 ENCOUNTER — Other Ambulatory Visit: Payer: Self-pay

## 2019-08-19 ENCOUNTER — Ambulatory Visit
Admission: RE | Admit: 2019-08-19 | Discharge: 2019-08-19 | Disposition: A | Discharge status: Home / Self Care | Payer: 59 | Source: Ambulatory Visit | Attending: Obstetrics & Gynecology | Admitting: Obstetrics & Gynecology

## 2019-08-19 DIAGNOSIS — N6012 Diffuse cystic mastopathy of left breast: Secondary | ICD-10-CM | POA: Diagnosis not present

## 2019-08-19 DIAGNOSIS — H5202 Hypermetropia, left eye: Secondary | ICD-10-CM | POA: Diagnosis not present

## 2019-08-19 DIAGNOSIS — H5211 Myopia, right eye: Secondary | ICD-10-CM | POA: Diagnosis not present

## 2019-08-19 MED ORDER — GADOBUTROL 1 MMOL/ML IV SOLN
7.0000 mL | Freq: Once | INTRAVENOUS | Status: AC | PRN
  Administered 2019-08-19: 7 mL via INTRAVENOUS

## 2019-08-24 MED FILL — DEXTROAMPHETAMINE 10 MG TAB: 10 | 90 days supply | Qty: 360 | Fill #0

## 2019-08-27 MED FILL — ALBUTEROL SULFATE HFA 108 (: 108 (90 BAS | 25 days supply | Qty: 18 | Fill #0

## 2019-08-31 MED FILL — SYNTHROID 100 MCG TABLET: 100 | 90 days supply | Qty: 90 | Fill #0

## 2019-09-02 DIAGNOSIS — L814 Other melanin hyperpigmentation: Secondary | ICD-10-CM | POA: Diagnosis not present

## 2019-09-02 DIAGNOSIS — L821 Other seborrheic keratosis: Secondary | ICD-10-CM | POA: Diagnosis not present

## 2019-09-02 DIAGNOSIS — D225 Melanocytic nevi of trunk: Secondary | ICD-10-CM | POA: Diagnosis not present

## 2019-09-02 DIAGNOSIS — L570 Actinic keratosis: Secondary | ICD-10-CM | POA: Diagnosis not present

## 2019-09-27 DIAGNOSIS — N760 Acute vaginitis: Secondary | ICD-10-CM | POA: Diagnosis not present

## 2019-10-06 MED FILL — ESZOPICLONE 3 MG TABS: 3 | 30 days supply | Qty: 30 | Fill #0

## 2019-10-06 MED FILL — FLUCONAZOLE 150 MG TABS: 150 | 6 days supply | Qty: 3 | Fill #1

## 2019-10-21 DIAGNOSIS — Z832 Family history of diseases of the blood and blood-forming organs and certain disorders involving the immune mechanism: Secondary | ICD-10-CM | POA: Diagnosis not present

## 2019-10-21 DIAGNOSIS — G629 Polyneuropathy, unspecified: Secondary | ICD-10-CM | POA: Diagnosis not present

## 2019-10-21 DIAGNOSIS — R21 Rash and other nonspecific skin eruption: Secondary | ICD-10-CM | POA: Insufficient documentation

## 2019-10-21 DIAGNOSIS — E038 Other specified hypothyroidism: Secondary | ICD-10-CM | POA: Diagnosis not present

## 2019-10-21 DIAGNOSIS — M2550 Pain in unspecified joint: Secondary | ICD-10-CM | POA: Diagnosis not present

## 2019-10-21 DIAGNOSIS — I7300 Raynaud's syndrome without gangrene: Secondary | ICD-10-CM | POA: Diagnosis not present

## 2019-10-21 DIAGNOSIS — G729 Myopathy, unspecified: Secondary | ICD-10-CM | POA: Diagnosis not present

## 2019-10-22 MED FILL — TRIAMCINOLONE 0.1% CREAM: 0.1 | 14 days supply | Qty: 30 | Fill #0

## 2019-10-28 MED FILL — PREGABALIN 50 MG CAPS: 50 | 30 days supply | Qty: 60 | Fill #0

## 2019-11-09 MED FILL — PREGABALIN 25 MG CAPS: 25 | 90 days supply | Qty: 180 | Fill #0

## 2019-11-15 MED FILL — valACYclovir HCL 1 GM TABS: 1 | 7 days supply | Qty: 15 | Fill #1

## 2019-11-15 MED FILL — ESZOPICLONE 3 MG TABS: 3 | 30 days supply | Qty: 30 | Fill #1

## 2019-11-16 MED FILL — SYNTHROID 100 MCG TABLET: 100 | 90 days supply | Qty: 90 | Fill #1

## 2019-12-13 ENCOUNTER — Telehealth: Payer: Self-pay | Admitting: Adult Health

## 2019-12-13 ENCOUNTER — Encounter: Payer: Self-pay | Admitting: Adult Health

## 2019-12-13 ENCOUNTER — Inpatient Hospital Stay: Payer: 59 | Attending: Adult Health | Admitting: Adult Health

## 2019-12-13 ENCOUNTER — Other Ambulatory Visit: Payer: Self-pay

## 2019-12-13 VITALS — BP 144/89 | HR 76 | Temp 97.8°F | Resp 18 | Ht 65.0 in | Wt 162.2 lb

## 2019-12-13 DIAGNOSIS — G629 Polyneuropathy, unspecified: Secondary | ICD-10-CM | POA: Insufficient documentation

## 2019-12-13 DIAGNOSIS — C50412 Malignant neoplasm of upper-outer quadrant of left female breast: Secondary | ICD-10-CM | POA: Insufficient documentation

## 2019-12-13 DIAGNOSIS — Z170 Estrogen receptor positive status [ER+]: Secondary | ICD-10-CM | POA: Insufficient documentation

## 2019-12-13 MED ORDER — ESZOPICLONE 3 MG PO TABS: ORAL_TABLET | ORAL | 5 refills | Status: DC

## 2019-12-13 MED ORDER — DULOXETINE HCL 20 MG PO CPEP: 20.0000 mg | ORAL_CAPSULE | Freq: Two times a day (BID) | ORAL | 0 refills | Status: DC

## 2019-12-13 NOTE — Telephone Encounter (Signed)
I talk with patient regarding schedule  

## 2019-12-13 NOTE — Progress Notes (Signed)
CLINIC:  Survivorship   REASON FOR VISIT:  Routine follow-up for history of breast cancer.   BRIEF ONCOLOGIC HISTORY:  Oncology History  Breast cancer, left breast ([place]) (Resolved)   Initial Diagnosis   Breast cancer, left breast   12/04/2012 Initial Biopsy   Left breast needle core biopsy (UOQ): Grade 3, DCIS with necrosis and calcs. ER+ (100%), PR+ (86%).    Breast cancer of upper-outer quadrant of left female breast ([place])  12/04/2012 Initial Biopsy   LEFT breast needle core biopsy (UOQ): Grade 3, DCIS with necrosis and calcs. ER+ (100%), PR+ (86%).    12/14/2012 Breast MRI   LEFT breast: Clumped nodular enhancement measuring 7.2 x 3.2 x 2.6 cm. Also associated post-biopsy change. RIGHT breast: At 12 o'clock location, there is 69m slightly irregular mass-like area of abnml enhancement. No lymphadenopathy.    12/25/2012 Initial Biopsy   RIGHT breast needle core biopsy (upper centeral): Grade 1-2, IDC, DCIS with calcs. ER+ (100%), PR+ (100%), HER2- (ratio 1.23). Ki67 4%.    12/25/2012 Initial Diagnosis   Bilateral breast cancer   12/31/2012 Surgery   Bilateral mastectomies with bilat SLNB ([place] [facility]. LEFT: Grade 2, IDC, 2.3 cm. (+) LVI. Grade 3 DCIS w/comedonecrosis & calcs. (-) margins. 1 left ax SLN neg.  ER+ (99%), PR+ (10%), HER2 repeated and (+) ratio (6.81). Ki67 46%. RIGHT: Benign, 1 SLN neg.   12/31/2012 Pathologic Stage   LEFT breast: pT2, pN0, pMx. Stage IIA.    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 EF 55-60%   02/04/2013 - 05/13/2013 Adjuvant Chemotherapy   Taxotere/Carbo/Herceptin q 3 wks x 5 cycles completed. (planned for 6 cycles but d/c'd early due to pt wishes and recent infection).    05/2013 -  Anti-estrogen oral therapy   Attempted to take Letrozole, Aromasin, & Tamoxifen but patient could not tolerate any anti-estrogen therapy due to severe depressive symptoms and  joint/muscle aches & pains.  No longer taking anti-estrogen therapy.   09/29/2013 Surgery   LEFT breast reconstruction with lat flap and placement of tissue expander ([place]).    11/26/2013 Imaging   CT chest: No definite CT findings for chest wall recurrence and no suspicious supraclavicular or axillary adenopathy.  Negative for metastatic disease in lungs or bones.     - 02/16/2014 Adjuvant Chemotherapy   Herceptin maintenance x 1 year completed.    03/09/2014 Surgery   Removal of bilateral tissue expanders and placement of bilateral breast implants ([facility]).    03/09/2014 Procedure   Right scar biopsy: No malignancy & Left breast capsule biopsy: No evidence of malignancy.    08/22/2014 Echocardiogram   Post-treatment. EF 55-60%   06/30/2015 Survivorship   Survivorship Care Plan given to patient and reviewed with her during in-person visit.       INTERVAL HISTORY:  Jane Gutierrez to the Survivorship Clinic today for routine follow-up for her history of breast cancer.    Jane Gutierrez is doing well.  She has latent peripheral neuropathy and was on pregabalin which she couldn't tolerate.  She has undergone full work up for this and is willing to take a different medication if indicated.    Her sister was diagnosed with CLL this year.  Jane Gutierrez is exercising regularly.  She is very active.    Jane Gutierrez is up to date with her skin cancer, colon cancer, and gyn cancer screenings.  She is requesting a refill for her lunesta today.  REVIEW OF SYSTEMS:  Review of Systems  Constitutional: Negative for appetite change, chills, fatigue, fever and unexpected weight change.  HENT:   Negative for hearing loss, lump/mass, nosebleeds, sore throat, tinnitus and trouble swallowing.   Eyes: Negative for eye problems and icterus.  Respiratory: Negative for chest tightness, cough and shortness of breath.   Cardiovascular: Negative for chest pain, leg swelling and palpitations.  Gastrointestinal: Negative for  abdominal distention, abdominal pain, blood in stool, constipation, diarrhea, nausea and vomiting.       Has h/o IBS   Endocrine: Negative for hot flashes.  Genitourinary: Negative for difficulty urinating.   Musculoskeletal: Negative for arthralgias.  Skin: Negative for itching and rash.  Neurological: Positive for numbness. Negative for dizziness, extremity weakness and headaches.  Hematological: Negative for adenopathy. Does not bruise/bleed easily.  Psychiatric/Behavioral: Positive for sleep disturbance (controlled with Lunesta at night time). Negative for depression. The patient is not nervous/anxious.   Breast: Denies any new nodularity, masses, tenderness, nipple changes, or nipple discharge.     PAST MEDICAL/SURGICAL HISTORY:  Past Medical History:  Diagnosis Date  . Abnormally small mouth   . ADD (attention deficit disorder)   . ADHD   . Allergy   . Anxiety    pt reported she doesn't have anxiety  . Asthma    prn inhaler  . Blood transfusion without reported diagnosis   . Breast cancer ([place])   . Dental crowns present   . Difficulty swallowing pills   . History of breast cancer   . History of cervical cancer   . History of palpitations    last echo 08/22/2014  . Hypothyroidism   . IBS (irritable bowel syndrome)    Past Surgical History:  Procedure Laterality Date  . ABDOMINAL HYSTERECTOMY    . ABDOMINOPLASTY  03/28/2004  . AXILLARY SENTINEL NODE BIOPSY  12/31/2012   Procedure: AXILLARY SENTINEL NODE BIOPSY;  Surgeon: Rolm Bookbinder, MD;  Location: [place];  Service: General;  Laterality: Bilateral;  bilateral sentinel node  . BRAIN SURGERY     removal benign frontal lobe cyst in 1987  . BREAST ENHANCEMENT SURGERY Bilateral ~ 2008  . BREAST IMPLANT EXCHANGE Bilateral 12/11/2017   Procedure: BILATERAL IMPLANT EXCHANGE;  Surgeon: Wallace Going, DO;  Location: [place];  Service: Plastics;  Laterality: Bilateral;  . BREAST  RECONSTRUCTION WITH PLACEMENT OF TISSUE EXPANDER AND FLEX HD (ACELLULAR HYDRATED DERMIS)  12/31/2012   Procedure: BREAST RECONSTRUCTION WITH PLACEMENT OF TISSUE EXPANDER AND FLEX HD (ACELLULAR HYDRATED DERMIS);  Surgeon: Theodoro Kos, DO;  Location: [place];  Service: Plastics;  Laterality: Bilateral;  bilateral immediate breast reconstruction with expanders and flex hd   . CAPSULOTOMY Left 09/01/2014   Procedure: CAPSULOTOMY OF LEFT BREAST WITH LIPOFILLING FOR SYMMETRY;  Surgeon: Theodoro Kos, DO;  Location: [place];  Service: Plastics;  Laterality: Left;  . CERVICAL CERCLAGE    . DIAGNOSTIC LAPAROSCOPY  05/12/2003   right partial salpingectomy; lysis paraovarian adhesions left  . DILATION AND CURETTAGE OF UTERUS  2005  . ENDOMETRIAL FULGURATION  05/12/2003  . FACIAL COSMETIC SURGERY  ~ 2010  . FOOT SURGERY Bilateral 07/2012  . GYNECOLOGIC CRYOSURGERY  1998  . INCISION AND DRAINAGE OF WOUND Left 04/28/2013   Procedure: IRRIGATION AND DEBRIDEMENT LEFT BREAST WOUND, POSSIBLE CLOSURE OF WOUND WITH DRAIN PLACEMENT;  Surgeon: Theodoro Kos, DO;  Location: [place];  Service: Plastics;  Laterality: Left;  . LATISSIMUS FLAP TO BREAST  Left 09/29/2013   Procedure: LATISSIMUS FLAP TO BREAST WITH TISSUE EXPANDER PLACEMENT FOR LEFT BREAST RECONSTRUCTION;  Surgeon: Theodoro Kos, DO;  Location: [place];  Service: Plastics;  Laterality: Left;  . LIPECTOMY Bilateral 03/28/2004   hip  . LIPOSUCTION Bilateral 03/09/2014   Procedure: LIPOSUCTION ABDOMEN W/ LIPOFILLING LATERAL BILATERAL BREAST;  Surgeon: Theodoro Kos, DO;  Location: [place];  Service: Plastics;  Laterality: Bilateral;  LIPOSUCTION NECK  . LIPOSUCTION WITH LIPOFILLING Bilateral 09/01/2014   Procedure: LIPOSUCTION WITH LIPOFILLING;  Surgeon: Theodoro Kos, DO;  Location: [place];  Service: Plastics;  Laterality: Bilateral;  . LYSIS OF ADHESION  03/28/2004   pelvic  .  MASTECTOMY    . PORT-A-CATH REMOVAL Right 03/09/2014   Procedure: REMOVAL PORT-A-CATH;  Surgeon: Theodoro Kos, DO;  Location: [place];  Service: Plastics;  Laterality: Right;  . PORTACATH PLACEMENT  01/27/2013   Procedure: INSERTION PORT-A-CATH;  Surgeon: Rolm Bookbinder, MD;  Location: [place];  Service: General;  Laterality: Right;  . RADIOFREQUENCY ABLATION NERVES  05/12/2003   uterosacral nerve ablation  . REMOVAL OF BILATERAL TISSUE EXPANDERS WITH PLACEMENT OF BILATERAL BREAST IMPLANTS Bilateral 03/09/2014   Procedure: REMOVAL OF BILATERAL TISSUE EXPANDERS WITH PLACEMENT OF BILATERAL BREAST IMPLANTS;  Surgeon: Theodoro Kos, DO;  Location: [place];  Service: Plastics;  Laterality: Bilateral;  . TEE WITHOUT CARDIOVERSION N/A 06/04/2013   Procedure: TRANSESOPHAGEAL ECHOCARDIOGRAM (TEE);  Surgeon: Jolaine Artist, MD;  Location: [facility] ENDOSCOPY;  Service: Cardiovascular;  Laterality: N/A;  . TISSUE EXPANDER PLACEMENT Left 06/16/2013   Procedure: LEFT BREAST TISSUE EXPANDER REMOVAL;  Surgeon: Theodoro Kos, DO;  Location: [place];  Service: Plastics;  Laterality: Left;  . TISSUE EXPANDER PLACEMENT Left 09/29/2013   Procedure: TISSUE EXPANDER;  Surgeon: Theodoro Kos, DO;  Location: [place];  Service: Plastics;  Laterality: Left;  . TOTAL ABDOMINAL HYSTERECTOMY W/ BILATERAL SALPINGOOPHORECTOMY  03/28/2004  . TOTAL MASTECTOMY  12/31/2012   Procedure: TOTAL MASTECTOMY;  Surgeon: Rolm Bookbinder, MD;  Location: [place];  Service: General;  Laterality: Bilateral;  bilateral total mastectomies     ALLERGIES:  Allergies  Allergen Reactions  . Betadine [Povidone Iodine] Anaphylaxis  . Doxycycline Rash  . Iodine Anaphylaxis  . Shellfish-Derived Products Anaphylaxis  . Cleocin [Clindamycin Hcl] Rash  . Ginger Rash  . Penicillins Rash  . Sulfamethoxazole-Trimethoprim Rash     CURRENT MEDICATIONS:  Outpatient  Encounter Medications as of 12/13/2019  Medication Sig Note  . albuterol (PROVENTIL) (2.5 MG/3ML) 0.083% nebulizer solution Take 2.5 mg by nebulization every 6 (six) hours as needed for wheezing or shortness of breath.   . B Complex-C (B-COMPLEX WITH VITAMIN C) tablet Take 1 tablet by mouth daily.   . Biotin 1 MG CAPS Take 1 capsule by mouth daily.   [name] dextroamphetamine (DEXTROSTAT) 5 MG tablet Take 5 mg by mouth 4 (four) times daily as needed.    [name] EPINEPHrine 0.3 mg/0.3 mL IJ SOAJ injection Inject 0.3 mLs (0.3 mg total) into the muscle once.   . Eszopiclone 3 MG TABS TAKE 1 TABLET BY MOUTH IMMEDIATELY BEFORE BEDTIME (Patient taking differently: TAKE 1 TABLET BY MOUTH IMMEDIATELY BEFORE BEDTIME ( patient takes a half tab at bedtime, per patient))   . hyoscyamine (ANASPAZ) 0.125 MG TBDP disintergrating tablet Place 0.125 mg under the tongue as needed.    . Multiple Vitamin (MULTIVITAMIN WITH MINERALS) TABS Take 1 tablet by mouth daily.   . mupirocin ointment (  BACTROBAN) 2 % Apply to affected nail twice daily for 7 days.   [name] SYNTHROID 100 MCG tablet TAKE 1 TABLET BY MOUTH DAILY.   . valACYclovir (VALTREX) 500 MG tablet Take 250 mg by mouth daily.   . vitamin E 600 UNIT capsule Take 800 Units by mouth daily.  04/06/2018: Reports she only takes '400mg'$  bid    No facility-administered encounter medications on file as of 12/13/2019.     ONCOLOGIC FAMILY HISTORY:  Family History  Problem Relation Age of Onset  . Cancer Mother 17       uterine, mult myeloma  . CVA Father   . Cancer Sister 40       breast  . Cancer Maternal Grandmother 75       breast  . Lupus Sister        twin sister  . Sarcoidosis Sister        twin sister  . Rheum arthritis Sister        twin sister  . Celiac disease Daughter       SOCIAL HISTORY:  Social History   Socioeconomic History  . Marital status: Married    Spouse name: Not on file  . Number of children: Not on file  . Years of education: Not on file   . Highest education level: Not on file  Occupational History  . Not on file  Tobacco Use  . Smoking status: Never Smoker  . Smokeless tobacco: Never Used  Substance and Sexual Activity  . Alcohol use: No    Alcohol/week: 0.0 standard drinks    Comment: occasionally  . Drug use: No  . Sexual activity: Yes  Other Topics Concern  . Not on file  Social History Narrative  . Not on file   Social Determinants of Health   Financial Resource Strain:   . Difficulty of Paying Living Expenses: Not on file  Food Insecurity:   . Worried About Charity fundraiser in the Last Year: Not on file  . Ran Out of Food in the Last Year: Not on file  Transportation Needs:   . Lack of Transportation (Medical): Not on file  . Lack of Transportation (Non-Medical): Not on file  Physical Activity:   . Days of Exercise per Week: Not on file  . Minutes of Exercise per Session: Not on file  Stress:   . Feeling of Stress : Not on file  Social Connections:   . Frequency of Communication with Friends and Family: Not on file  . Frequency of Social Gatherings with Friends and Family: Not on file  . Attends Religious Services: Not on file  . Active Member of Clubs or Organizations: Not on file  . Attends Archivist Meetings: Not on file  . Marital Status: Not on file  Intimate Partner Violence:   . Fear of Current or Ex-Partner: Not on file  . Emotionally Abused: Not on file  . Physically Abused: Not on file  . Sexually Abused: Not on file     PHYSICAL EXAMINATION:  Vital Signs: Vitals:   12/13/19 1054  BP: (!) 144/89  Pulse: 76  Resp: 18  Temp: 97.8 F (36.6 C)  SpO2: 100%   Filed Weights   12/13/19 1054  Weight: 162 lb 3.2 oz (73.6 kg)   General: Well-nourished, well-appearing female in no acute distress.  Unaccompanied today.   HEENT: Head is normocephalic.  Pupils equal and reactive to light. Conjunctivae clear without exudate.  Sclerae anicteric. Oral  mucosa is pink, moist.   Oropharynx is pink without lesions or erythema.  Lymph: No cervical, supraclavicular, or infraclavicular lymphadenopathy noted on palpation.  Cardiovascular: Regular rate and rhythm.[name] Respiratory: Clear to auscultation bilaterally. Chest expansion symmetric; breathing non-labored.  Breast Exam:  Breasts: s/p bilateral mastectomies and implant placement.  No nodules, masses, sign of recurrence -Axilla: No axillary adenopathy bilaterally.  GI: Abdomen soft and round; non-tender, non-distended. Bowel sounds normoactive. No hepatosplenomegaly.   GU: Deferred.  Neuro: No focal deficits. Steady gait.  Psych: Mood and affect normal and appropriate for situation.  MSK: No focal spinal tenderness to palpation, full range of motion in bilateral upper extremities Extremities: No edema. Skin: Warm and dry.  LABORATORY DATA:  None for this visit   DIAGNOSTIC IMAGING:  Most recent mammogram: n/a s/p bilateral mastectomies MRI 07/2019 negative    ASSESSMENT AND PLAN:  Ms.. Jane Gutierrez is a pleasant 63 y.o. female with history of Stage IIA left breast invasive ductal carcinoma, ER+/PR+/HER2+, diagnosed in 11/2012, treated with bilateral mastectomies, adjuvant chemotherapy, and maintenance trastuzumab.  She was unable to tolerate antiestrogen therapy.  She presents to the Survivorship Clinic for surveillance and routine follow-up.   1. History of breast cancer:  [name] is currently clinically and radiographically without evidence of disease or recurrence of breast cancer.  She will return in 1 year for continue follow up and surveillance.  I encouraged her to call me with any questions or concerns before her next visit at the cancer center, and I would be happy to see her sooner, if needed.    2. Peripheral neuropathy: I sent in cymbalta for her to take.  She wants the lowest dose possible.  I sent in '20mg'$  for her to take and start daily, and if tolerated, I recommended she increase it to BID.    3.  Sleep pattern disturbance: Lunesta sent into [facility] electronically.  She is tolerating this well.    4. Bone health:  Given [name] age, history of breast cancer, she is at risk for bone demineralization. I will defer to her PCP regarding future bone density testing and management.  She was given education on specific food and activities to promote bone health.  5. Cancer screening:  Due to [name] history and her age, she should receive screening for skin cancers, colon cancer (more frequent due to Chek 2 positivity), and gynecologic cancers. She was encouraged to follow-up with her PCP for appropriate cancer screenings.   6. Health maintenance and wellness promotion: [name] was encouraged to consume 5-7 servings of fruits and vegetables per day. She was also encouraged to engage in moderate to vigorous exercise for 30 minutes per day most days of the week. She was instructed to limit her alcohol consumption and continue to abstain from tobacco use.      Dispo:  -Return to cancer center in 1 year for LTS follow up   A total of (25) minutes of face-to-face time was spent with this patient with greater than 50% of that time in counseling and care-coordination.   Gardenia Phlegm, NP Survivorship Program [facility] [phone]   Note: PRIMARY CARE PROVIDER Reynold Bowen, [place] [phone]

## 2019-12-14 MED FILL — DULoxetine HCL 20 MG CPEP: 20 | 30 days supply | Qty: 60 | Fill #0

## 2019-12-14 MED FILL — DULOXETINE HCL 20 MG CPEP: 20 | 30 days supply | Qty: 60 | Fill #0 | Status: TO

## 2019-12-20 ENCOUNTER — Institutional Professional Consult (permissible substitution): Payer: 59 | Admitting: Plastic Surgery

## 2019-12-23 MED FILL — XIFAXAN 550 MG TABLET: 550 | 10 days supply | Qty: 20 | Fill #0

## 2020-01-06 ENCOUNTER — Telehealth: Payer: 59 | Admitting: Physician Assistant

## 2020-01-06 DIAGNOSIS — A6000 Herpesviral infection of urogenital system, unspecified: Secondary | ICD-10-CM | POA: Diagnosis not present

## 2020-01-06 MED ORDER — ACYCLOVIR 5 % EX OINT: 1.0000 "application " | TOPICAL_OINTMENT | CUTANEOUS | 0 refills | Status: AC

## 2020-01-06 NOTE — Progress Notes (Signed)
We are sorry that you are not feeling well. Here is how we plan to help! Based on what you shared with me it looks like you: have vaginal discomfort secondary to the herpes virus.  Your treatment plan is to continue taking the valacyclovir that you have been prescribed previously by your provider. I have also given a prescription for an antiviral ointment for the lesions. To help with the pain, you may also use 650 mg of Tylenol every 6 hours and use cold compresses on the lesions.  Be sure to take all of the medication as directed. Stop taking any medication if you develop a rash, tongue swelling or shortness of breath. Mothers who are breast feeding should consider pumping and discarding their breast milk while on these antibiotics. However, there is no consensus that infant exposure at these doses would be harmful.  Remember that medication creams can weaken latex condoms. [name]   HOME CARE:  Good hygiene may prevent some types of vaginosis from recurring and may relieve some symptoms:  . Avoid baths, hot tubs and whirlpool spas. Rinse soap from your outer genital area after a shower, and dry the area well to prevent irritation. Don't use scented or harsh soaps, such as those with deodorant or antibacterial action. [name] Avoid irritants. These include scented tampons and pads. . Wipe from front to back after using the toilet. Doing so avoids spreading fecal bacteria to your vagina.  Other things that may help prevent vaginosis include:  [name] Don't douche. Your vagina doesn't require cleansing other than normal bathing. Repetitive douching disrupts the normal organisms that reside in the vagina and can actually increase your risk of vaginal infection. Douching won't clear up a vaginal infection. . Use a latex condom. Both female and female latex condoms may help you avoid infections spread by sexual contact. . Wear cotton underwear. Also wear pantyhose with a cotton crotch. If you feel comfortable without  it, skip wearing underwear to bed. Yeast thrives in [employer] Your symptoms should improve in the next day or two.  GET HELP RIGHT AWAY IF:  . You have pain in your lower abdomen ( pelvic area or over your ovaries) . You develop nausea or vomiting . You develop a fever . Your discharge changes or worsens . You have persistent pain with intercourse . You develop shortness of breath, a rapid pulse, or you faint.  These symptoms could be signs of problems or infections that need to be evaluated by a medical provider now.  MAKE SURE YOU    Understand these instructions.  Will watch your condition.  Will get help right away if you are not doing well or get worse.  Your e-visit answers were reviewed by a board certified advanced clinical practitioner to complete your personal care plan. Depending upon the condition, your plan could have included both over the counter or prescription medications. Please review your pharmacy choice to make sure that you have choses a pharmacy that is open for you to pick up any needed prescription, Your safety is important to [place]. If you have drug allergies check your prescription carefully.   You can use MyChart to ask questions about today's visit, request a non-urgent call back, or ask for a work or school excuse for 24 hours related to this e-Visit. If it has been greater than 24 hours you will need to follow up with your provider, or enter a new e-Visit to address those concerns. You will get a MyChart message  within the next two days asking about your experience. I Kamrynn that your e-visit has been valuable and will speed your recovery.  Greater than 5 minutes, yet less than 10 minutes of time have been spend researching, coordinating, and implementing care for this patient today.

## 2020-01-10 MED FILL — ESZOPICLONE 3 MG TABS: 3 | 30 days supply | Qty: 30 | Fill #2

## 2020-01-11 DIAGNOSIS — E038 Other specified hypothyroidism: Secondary | ICD-10-CM | POA: Diagnosis not present

## 2020-01-11 DIAGNOSIS — E7849 Other hyperlipidemia: Secondary | ICD-10-CM | POA: Diagnosis not present

## 2020-01-12 MED FILL — CEPHALEXIN 500 MG CAPSULE: 500 | 7 days supply | Qty: 21 | Fill #0

## 2020-01-18 DIAGNOSIS — E785 Hyperlipidemia, unspecified: Secondary | ICD-10-CM | POA: Diagnosis not present

## 2020-01-18 DIAGNOSIS — D126 Benign neoplasm of colon, unspecified: Secondary | ICD-10-CM | POA: Diagnosis not present

## 2020-01-18 DIAGNOSIS — I7300 Raynaud's syndrome without gangrene: Secondary | ICD-10-CM | POA: Diagnosis not present

## 2020-01-18 DIAGNOSIS — F5104 Psychophysiologic insomnia: Secondary | ICD-10-CM | POA: Diagnosis not present

## 2020-01-18 DIAGNOSIS — C539 Malignant neoplasm of cervix uteri, unspecified: Secondary | ICD-10-CM | POA: Diagnosis not present

## 2020-01-18 DIAGNOSIS — Z0000 Encounter for general adult medical examination without abnormal findings: Secondary | ICD-10-CM | POA: Diagnosis not present

## 2020-01-18 DIAGNOSIS — G43009 Migraine without aura, not intractable, without status migrainosus: Secondary | ICD-10-CM | POA: Diagnosis not present

## 2020-01-18 DIAGNOSIS — Z1331 Encounter for screening for depression: Secondary | ICD-10-CM | POA: Diagnosis not present

## 2020-01-18 DIAGNOSIS — C50919 Malignant neoplasm of unspecified site of unspecified female breast: Secondary | ICD-10-CM | POA: Diagnosis not present

## 2020-01-18 DIAGNOSIS — G629 Polyneuropathy, unspecified: Secondary | ICD-10-CM | POA: Diagnosis not present

## 2020-01-18 DIAGNOSIS — Z1339 Encounter for screening examination for other mental health and behavioral disorders: Secondary | ICD-10-CM | POA: Diagnosis not present

## 2020-01-19 MED FILL — ALBUTEROL SULFATE HFA 108 (: 108 (90 BAS | 25 days supply | Qty: 18 | Fill #0

## 2020-02-08 MED FILL — valACYclovir HCL 1 GM TABS: 1 | 7 days supply | Qty: 15 | Fill #2

## 2020-02-09 MED FILL — XIFAXAN 550 MG TABLET: 550 | 10 days supply | Qty: 20 | Fill #0

## 2020-02-17 MED FILL — ESZOPICLONE 3 MG TABS: 3 | 30 days supply | Qty: 30 | Fill #0

## 2020-02-17 MED FILL — SYNTHROID 100 MCG TABLET: 100 | 90 days supply | Qty: 90 | Fill #2

## 2020-02-23 MED FILL — DEXTROAMPHETAMINE 10 MG TAB: 10 | 90 days supply | Qty: 360 | Fill #0

## 2020-03-01 ENCOUNTER — Other Ambulatory Visit: Payer: Self-pay

## 2020-03-01 ENCOUNTER — Ambulatory Visit
Admission: RE | Admit: 2020-03-01 | Discharge: 2020-03-01 | Disposition: A | Discharge status: Home / Self Care | Payer: 59 | Source: Ambulatory Visit

## 2020-03-01 DIAGNOSIS — R300 Dysuria: Secondary | ICD-10-CM

## 2020-03-01 MED ORDER — NITROFURANTOIN MONOHYD MACRO 100 MG PO CAPS: 100.0000 mg | ORAL_CAPSULE | Freq: Two times a day (BID) | ORAL | 0 refills | Status: DC

## 2020-03-01 MED FILL — NITROFURANTOIN MONO-MCR 100: 100 | 5 days supply | Qty: 10 | Fill #0

## 2020-03-01 NOTE — Discharge Instructions (Addendum)
Take the antibiotic as directed.    Follow up with your primary care provider or come here to be seen in person if your symptoms are not improving.    

## 2020-03-01 NOTE — ED Provider Notes (Signed)
Virtual Visit via Video Note:  Jane Gutierrez  initiated request for Telemedicine visit with [facility] Urgent Care team. I connected with Jane Gutierrez  on 03/01/2020 at 12:10 PM  for a synchronized telemedicine visit using a video enabled HIPPA compliant telemedicine application. I verified that I am speaking with Jane Gutierrez  using two identifiers. Sharion Balloon, NP  was physically located in a [facility] Urgent care site and [place] was located at a different location.   The limitations of evaluation and management by telemedicine as well as the availability of in-person appointments were discussed. Patient was informed that she  may incur a bill ( including co-pay) for this virtual visit encounter. Jane Gutierrez  expressed understanding and gave verbal consent to proceed with virtual visit.     History of Present Illness:Jane Gutierrez  is a 64 y.o. female presents for evaluation of malodorous, cloudy urine, dysuria, lower back pain x 2 days.  Patient states this feels like previous UTIs.  She is treating her symptoms at home with Azo.  She denies fever, chills, hematuria, abdominal pain, vaginal discharge, pelvic pain, or other symptoms.    Allergies  Allergen Reactions  . Betadine [Povidone Iodine] Anaphylaxis  . Doxycycline Rash  . Iodine Anaphylaxis  . Shellfish-Derived Products Anaphylaxis  . Cleocin [Clindamycin Hcl] Rash  . Ginger Rash  . Penicillins Rash  . Sulfamethoxazole-Trimethoprim Rash     Past Medical History:  Diagnosis Date  . Abnormally small mouth   . ADD (attention deficit disorder)   . ADHD   . Allergy   . Anxiety    pt reported she doesn't have anxiety  . Asthma    prn inhaler  . Blood transfusion without reported diagnosis   . Breast cancer ([place])   . Dental crowns present   . Difficulty swallowing pills   . History of breast cancer   . History of cervical cancer   . History of palpitations    last echo 08/22/2014  . Hypothyroidism   . IBS  (irritable bowel syndrome)      Social History   Tobacco Use  . Smoking status: Never Smoker  . Smokeless tobacco: Never Used  Substance Use Topics  . Alcohol use: No    Alcohol/week: 0.0 standard drinks    Comment: occasionally  . Drug use: No    ROS: as stated in HPI.  All other systems reviewed and negative.      Observations/Objective: Physical Exam  VITALS: Patient denies fever. GENERAL: Alert, appears well and in no acute distress. HEENT: Atraumatic. NECK: Normal movements of the head and neck. CARDIOPULMONARY: No increased WOB. Speaking in clear sentences. I:E ratio WNL.  MS: Moves all visible extremities without noticeable abnormality. PSYCH: Pleasant and cooperative, well-groomed. Speech normal rate and rhythm. Affect is appropriate. Insight and judgement are appropriate. Attention is focused, linear, and appropriate.  NEURO: CN grossly intact. Oriented as arrived to appointment on time with no prompting. Moves both UE equally.  SKIN: No obvious lesions, wounds, erythema, or cyanosis noted on face or hands.   Assessment and Plan:    ICD-10-CM   1. Dysuria  R30.0        Follow Up Instructions: Treating with Macrobid.  Instructed patient to follow-up with her PCP or come here to be seen in person if her symptoms are not improving.  Patient agrees to plan of care.      I discussed the assessment and treatment  plan with the patient. The patient was provided an opportunity to ask questions and all were answered. The patient agreed with the plan and demonstrated an understanding of the instructions.   The patient was advised to call back or seek an in-person evaluation if the symptoms worsen or if the condition fails to improve as anticipated.      Sharion Balloon, NP  03/01/2020 12:10 PM         Sharion Balloon, NP 03/01/20 1210

## 2020-03-31 ENCOUNTER — Other Ambulatory Visit: Payer: Self-pay | Admitting: Podiatry

## 2020-03-31 ENCOUNTER — Ambulatory Visit (INDEPENDENT_AMBULATORY_CARE_PROVIDER_SITE_OTHER): Payer: 59

## 2020-03-31 ENCOUNTER — Other Ambulatory Visit: Payer: Self-pay

## 2020-03-31 ENCOUNTER — Ambulatory Visit: Payer: 59 | Admitting: Podiatry

## 2020-03-31 DIAGNOSIS — M205X2 Other deformities of toe(s) (acquired), left foot: Secondary | ICD-10-CM

## 2020-03-31 DIAGNOSIS — M7752 Other enthesopathy of left foot: Secondary | ICD-10-CM

## 2020-03-31 DIAGNOSIS — M21272 Flexion deformity, left ankle and toes: Secondary | ICD-10-CM | POA: Diagnosis not present

## 2020-03-31 DIAGNOSIS — M79672 Pain in left foot: Secondary | ICD-10-CM

## 2020-03-31 DIAGNOSIS — G8929 Other chronic pain: Secondary | ICD-10-CM

## 2020-03-31 DIAGNOSIS — M79675 Pain in left toe(s): Secondary | ICD-10-CM

## 2020-04-04 ENCOUNTER — Other Ambulatory Visit: Payer: Self-pay | Admitting: Podiatry

## 2020-04-04 DIAGNOSIS — M7752 Other enthesopathy of left foot: Secondary | ICD-10-CM

## 2020-05-20 MED FILL — DEXTROAMPHETAMINE 10 MG TAB: 10 | 90 days supply | Qty: 360 | Fill #0

## 2020-05-24 ENCOUNTER — Other Ambulatory Visit: Payer: Self-pay

## 2020-05-24 ENCOUNTER — Ambulatory Visit: Payer: Self-pay

## 2020-05-24 ENCOUNTER — Ambulatory Visit: Payer: 59 | Admitting: Orthopedic Surgery

## 2020-05-24 DIAGNOSIS — M25562 Pain in left knee: Secondary | ICD-10-CM

## 2020-05-25 ENCOUNTER — Encounter: Payer: Self-pay | Admitting: Orthopedic Surgery

## 2020-05-25 NOTE — Progress Notes (Signed)
Office Visit Note   Patient: Jane Gutierrez           Date of Birth: 1956-04-29           MRN: [id number] Visit Date: 05/24/2020 Requested by: Reynold Bowen, MD [place],  [place] [address] PCP: Reynold Bowen, MD  Subjective: Chief Complaint  Patient presents with  . Left Knee - Pain    HPI: Jane Gutierrez is a 64 year old patient with left knee pain.  Reports lateral and medial pain worse with stairs.  Actually the pain that hurts her the most is when she goes down steps.  She is okay on the same.  Takes no pain except occasional ibuprofen.  She has had about 3 episodes of "locking" over the past 5 years.  Denies any swelling.  She states that in general most of her joints hurt after chemo that she took for cancer.              ROS: All systems reviewed are negative as they relate to the chief complaint within the history of present illness.  Patient denies  fevers or chills.   Assessment & Plan: Visit Diagnoses:  1. Acute pain of left knee     Plan: Impression is acute pain left knee with no radiographic abnormalities in terms of arthritis or metastatic process.  I think she may have some synovitis anterior lateral which is where she is most tender.  Collateral and cruciate ligaments are stable with no effusion.  Discussed injection today but she wants to wait it out for now.  I will see her back as needed.  Follow-Up Instructions: Return if symptoms worsen or fail to improve.   Orders:  Orders Placed This Encounter  Procedures  . XR KNEE 3 VIEW LEFT   No orders of the defined types were placed in this encounter.     Procedures: No procedures performed   Clinical Data: No additional findings.  Objective: Vital Signs: There were no vitals taken for this visit.  Physical Exam:   Constitutional: Patient appears well-developed HEENT:  Head: Normocephalic Eyes:EOM are normal Neck: Normal range of motion Cardiovascular: Normal rate Pulmonary/chest: Effort  normal Neurologic: Patient is alert Skin: Skin is warm Psychiatric: Patient has normal mood and affect    Ortho Exam: Ortho exam demonstrates full active and passive range of motion of that left knee.  She has a little bit of tenderness on the lateral aspect of the patellar tendon just above the joint line.  No effusion.  Collateral crucial ligaments are stable.  Pedal pulses palpable.  No groin pain with internal X rotation of the leg.  No nerve root tension signs.  Specialty Comments:  No specialty comments available.  Imaging: No results found.   PMFS History: Patient Active Problem List   Diagnosis Date Noted  . Dyspnea 09/11/2015  . Breast cancer of upper-outer quadrant of left female breast ([place]) 06/28/2015  . Right hip pain 09/19/2014  . Right knee pain 09/19/2014  . Dyspnea on exertion 08/08/2014  . Heart palpitations 08/08/2014  . Acquired absence of breast and absent nipple 03/09/2014  . Pre-operative cardiovascular examination 09/09/2013  . Other chronic pulmonary heart diseases 05/27/2013  . SIRS (systemic inflammatory response syndrome) ([place]) 04/14/2013  . Anemia 04/14/2013  . Dehydration 04/14/2013  . Fever 04/14/2013  . Allergy   . IBS (irritable bowel syndrome)   . Thyroid disease   . ADD (attention deficit disorder)   . Lyme disease   .  Asthma   . Hypothyroidism   . Contact lens/glasses fitting    Past Medical History:  Diagnosis Date  . Abnormally small mouth   . ADD (attention deficit disorder)   . ADHD   . Allergy   . Anxiety    pt reported she doesn't have anxiety  . Asthma    prn inhaler  . Blood transfusion without reported diagnosis   . Breast cancer ([place])   . Dental crowns present   . Difficulty swallowing pills   . History of breast cancer   . History of cervical cancer   . History of palpitations    last echo 08/22/2014  . Hypothyroidism   . IBS (irritable bowel syndrome)     Family History  Problem Relation Age of Onset  .  Cancer Mother 66       uterine, mult myeloma  . CVA Father   . Cancer Sister 87       breast  . Leukemia Sister        CLL  . Cancer Maternal Grandmother 54       breast  . Lupus Sister        twin sister  . Sarcoidosis Sister        twin sister  . Rheum arthritis Sister        twin sister  . Celiac disease Daughter     Past Surgical History:  Procedure Laterality Date  . ABDOMINAL HYSTERECTOMY    . ABDOMINOPLASTY  03/28/2004  . AXILLARY SENTINEL NODE BIOPSY  12/31/2012   Procedure: AXILLARY SENTINEL NODE BIOPSY;  Surgeon: Rolm Bookbinder, MD;  Location: [place];  Service: General;  Laterality: Bilateral;  bilateral sentinel node  . BRAIN SURGERY     removal benign frontal lobe cyst in 1987  . BREAST ENHANCEMENT SURGERY Bilateral ~ 2008  . BREAST IMPLANT EXCHANGE Bilateral 12/11/2017   Procedure: BILATERAL IMPLANT EXCHANGE;  Surgeon: Wallace Going, DO;  Location: [facility];  Service: Plastics;  Laterality: Bilateral;  . BREAST RECONSTRUCTION WITH PLACEMENT OF TISSUE EXPANDER AND FLEX HD (ACELLULAR HYDRATED DERMIS)  12/31/2012   Procedure: BREAST RECONSTRUCTION WITH PLACEMENT OF TISSUE EXPANDER AND FLEX HD (ACELLULAR HYDRATED DERMIS);  Surgeon: Theodoro Kos, DO;  Location: [place];  Service: Plastics;  Laterality: Bilateral;  bilateral immediate breast reconstruction with expanders and flex hd   . CAPSULOTOMY Left 09/01/2014   Procedure: CAPSULOTOMY OF LEFT BREAST WITH LIPOFILLING FOR SYMMETRY;  Surgeon: Theodoro Kos, DO;  Location: [facility];  Service: Plastics;  Laterality: Left;  . CERVICAL CERCLAGE    . DIAGNOSTIC LAPAROSCOPY  05/12/2003   right partial salpingectomy; lysis paraovarian adhesions left  . DILATION AND CURETTAGE OF UTERUS  2005  . ENDOMETRIAL FULGURATION  05/12/2003  . FACIAL COSMETIC SURGERY  ~ 2010  . FOOT SURGERY Bilateral 07/2012  . GYNECOLOGIC CRYOSURGERY  1998  . INCISION AND DRAINAGE  OF WOUND Left 04/28/2013   Procedure: IRRIGATION AND DEBRIDEMENT LEFT BREAST WOUND, POSSIBLE CLOSURE OF WOUND WITH DRAIN PLACEMENT;  Surgeon: Theodoro Kos, DO;  Location: [place];  Service: Plastics;  Laterality: Left;  . LATISSIMUS FLAP TO BREAST Left 09/29/2013   Procedure: LATISSIMUS FLAP TO BREAST WITH TISSUE EXPANDER PLACEMENT FOR LEFT BREAST RECONSTRUCTION;  Surgeon: Theodoro Kos, DO;  Location: [facility];  Service: Plastics;  Laterality: Left;  . LIPECTOMY Bilateral 03/28/2004   hip  . LIPOSUCTION Bilateral 03/09/2014   Procedure: LIPOSUCTION ABDOMEN W/ LIPOFILLING LATERAL  BILATERAL BREAST;  Surgeon: Theodoro Kos, DO;  Location: [place];  Service: Plastics;  Laterality: Bilateral;  LIPOSUCTION NECK  . LIPOSUCTION WITH LIPOFILLING Bilateral 09/01/2014   Procedure: LIPOSUCTION WITH LIPOFILLING;  Surgeon: Theodoro Kos, DO;  Location: [facility];  Service: Plastics;  Laterality: Bilateral;  . LYSIS OF ADHESION  03/28/2004   pelvic  . MASTECTOMY    . PORT-A-CATH REMOVAL Right 03/09/2014   Procedure: REMOVAL PORT-A-CATH;  Surgeon: Theodoro Kos, DO;  Location: [facility];  Service: Plastics;  Laterality: Right;  . PORTACATH PLACEMENT  01/27/2013   Procedure: INSERTION PORT-A-CATH;  Surgeon: Rolm Bookbinder, MD;  Location: [facility];  Service: General;  Laterality: Right;  . RADIOFREQUENCY ABLATION NERVES  05/12/2003   uterosacral nerve ablation  . REMOVAL OF BILATERAL TISSUE EXPANDERS WITH PLACEMENT OF BILATERAL BREAST IMPLANTS Bilateral 03/09/2014   Procedure: REMOVAL OF BILATERAL TISSUE EXPANDERS WITH PLACEMENT OF BILATERAL BREAST IMPLANTS;  Surgeon: Theodoro Kos, DO;  Location: [place];  Service: Plastics;  Laterality: Bilateral;  . TEE WITHOUT CARDIOVERSION N/A 06/04/2013   Procedure: TRANSESOPHAGEAL ECHOCARDIOGRAM (TEE);  Surgeon: Jolaine Artist, MD;  Location: [facility] ENDOSCOPY;  Service:  Cardiovascular;  Laterality: N/A;  . TISSUE EXPANDER PLACEMENT Left 06/16/2013   Procedure: LEFT BREAST TISSUE EXPANDER REMOVAL;  Surgeon: Theodoro Kos, DO;  Location: [place];  Service: Plastics;  Laterality: Left;  . TISSUE EXPANDER PLACEMENT Left 09/29/2013   Procedure: TISSUE EXPANDER;  Surgeon: Theodoro Kos, DO;  Location: [place];  Service: Plastics;  Laterality: Left;  . TOTAL ABDOMINAL HYSTERECTOMY W/ BILATERAL SALPINGOOPHORECTOMY  03/28/2004  . TOTAL MASTECTOMY  12/31/2012   Procedure: TOTAL MASTECTOMY;  Surgeon: Rolm Bookbinder, MD;  Location: [facility];  Service: General;  Laterality: Bilateral;  bilateral total mastectomies   Social History   Occupational History  . Not on file  Tobacco Use  . Smoking status: Never Smoker  . Smokeless tobacco: Never Used  Substance and Sexual Activity  . Alcohol use: No    Alcohol/week: 0.0 standard drinks    Comment: occasionally  . Drug use: No  . Sexual activity: Yes

## 2020-06-02 ENCOUNTER — Other Ambulatory Visit: Payer: Self-pay

## 2020-06-02 ENCOUNTER — Ambulatory Visit (INDEPENDENT_AMBULATORY_CARE_PROVIDER_SITE_OTHER): Payer: Self-pay | Admitting: Plastic Surgery

## 2020-06-02 ENCOUNTER — Encounter: Payer: Self-pay | Admitting: Plastic Surgery

## 2020-06-02 VITALS — BP 135/93 | HR 116 | Temp 97.7°F | Ht 65.0 in | Wt 163.4 lb

## 2020-06-02 DIAGNOSIS — Z411 Encounter for cosmetic surgery: Secondary | ICD-10-CM

## 2020-06-02 NOTE — Progress Notes (Signed)
Patient presents to discuss a few facial cosmetic issues.  She is gotten Botox on and off in the past and would like to discuss getting some more for her forehead glabella crows feet.  She also has a small depression in the left infraorbital area that bothers her and would like to see if any filler could be put into that.  She also has scar in the right nasolabial fold that gets depressed particularly on animation wants to see if some filler can be put along that.  On exam she has dynamic and static righted's in the forehead glabella and crows feet.  She has a subtle depression in the infraorbital area from a old trauma and a very nicely healed scar in the right nasolabial fold as it transitions onto the marionette line with a small depression.  We discussed Botox injection and Restylane define filler injections for the subtle depressions.  We discussed the risks and benefits and she wants to proceed  Face was prepped with chlorhexidine wipe.  Approximately half a vial of Restylane define was split between the infraorbital depression on the left side and the nasolabial fold on the right side.  This gave a nice correction of both areas.  32 units of Botox were then distributed along the forehead glabella and crows feet.  She tolerated this well.  She did have the remainder of the Restylane define the left and she elected to keep this for future touchups if necessary.  Lot number for the Botox is [id number] a C4 with an expiration of September 2023.  [place]ot [place] for the Restylane is [id number].

## 2020-06-16 ENCOUNTER — Other Ambulatory Visit: Payer: Self-pay

## 2020-06-16 ENCOUNTER — Ambulatory Visit (INDEPENDENT_AMBULATORY_CARE_PROVIDER_SITE_OTHER): Payer: Self-pay | Admitting: Plastic Surgery

## 2020-06-16 ENCOUNTER — Encounter: Payer: Self-pay | Admitting: Plastic Surgery

## 2020-06-16 VITALS — BP 138/88 | HR 74 | Temp 97.5°F

## 2020-06-16 DIAGNOSIS — Z411 Encounter for cosmetic surgery: Secondary | ICD-10-CM

## 2020-06-16 NOTE — Progress Notes (Signed)
Patient presents to use the remainder of her filler from a previous visit.  We had done Botox to the forehead and glabella which she is very happy with.  We also did some Restylane filler to eschar on her left upper cheek and a scar in her right nasolabial fold.  She is very happy with those results as well.  She would like the remainder put in the marionette lines.  She has about 0.6 cc of Restylane define left and this was split evenly between the marionette lines on both sides after prepping with an alcohol pad.  She tolerated this well.  We will plan to see her back as needed.

## 2020-06-20 ENCOUNTER — Other Ambulatory Visit: Payer: Self-pay | Admitting: Adult Health

## 2020-06-20 DIAGNOSIS — C50412 Malignant neoplasm of upper-outer quadrant of left female breast: Secondary | ICD-10-CM

## 2020-06-20 MED FILL — valACYclovir HCL 1 GM TABS: 1 | 7 days supply | Qty: 15 | Fill #3

## 2020-06-21 MED FILL — ESZOPICLONE 3 MG TABS: 3 | 30 days supply | Qty: 30 | Fill #0

## 2020-06-24 NOTE — Progress Notes (Signed)
  Subjective:  Patient ID: Jane Gutierrez, female    DOB: 1956-02-09,  MRN: [id number]  Chief Complaint  Patient presents with  . Toe Pain    Left 1st and 2nd toe "Feels like broken glass when I walk", pt states she has a silicone spacer which helps. Pain began 25moago and pt states no known injuries.     64y.o. female presents with the above complaint. History confirmed with patient.  Had surgery Dr. RPaulla Dollyin 2015 wants to discuss preventing any further pain.  Objective:  Physical Exam: warm, good capillary refill, no trophic changes or ulcerative lesions, normal DP and PT pulses and normal sensory exam. Left Foot: Pain to palpation at the first and second metatarsophalangeal joints with crepitus on range of motion first MPJ  No images are attached to the encounter.  Radiographs: X-ray of the left foot: Uneven joint space narrowing of the first MPJ, met primus elevatus with dorsal exostosis Assessment:   1. Capsulitis of metatarsophalangeal (MTP) joint of left foot   2. Hallux limitus of left foot   3. Chronic toe pain, left foot   4. Metatarsus primus elevatus, left    Plan:  Patient was evaluated and treated and all questions answered.  Arthritis first MPJ -X-rays reviewed with patient -Discussed likely need in the future for first MPJ joint replacement.  Patient would like to think this over at this time. -Injection delivered first MPJ left  Procedure: Joint Injection Location: Left 1st MPJ joint Skin Prep: Alcohol. Injectate: 0.5 cc 1% lidocaine plain, 0.5 cc dexamethasone phosphate. Disposition: Patient tolerated procedure well. Injection site dressed with a band-aid.  Return if symptoms worsen or fail to improve.

## 2020-07-06 ENCOUNTER — Encounter (HOSPITAL_COMMUNITY): Payer: Self-pay

## 2020-07-06 ENCOUNTER — Ambulatory Visit (HOSPITAL_COMMUNITY)
Admission: RE | Admit: 2020-07-06 | Discharge: 2020-07-06 | Disposition: A | Discharge status: Home / Self Care | Payer: 59 | Source: Ambulatory Visit | Attending: Gastroenterology | Admitting: Gastroenterology

## 2020-07-06 ENCOUNTER — Other Ambulatory Visit (HOSPITAL_COMMUNITY): Payer: Self-pay | Admitting: Gastroenterology

## 2020-07-06 ENCOUNTER — Other Ambulatory Visit: Payer: Self-pay | Admitting: Gastroenterology

## 2020-07-06 DIAGNOSIS — K219 Gastro-esophageal reflux disease without esophagitis: Secondary | ICD-10-CM | POA: Diagnosis not present

## 2020-07-06 DIAGNOSIS — R140 Abdominal distension (gaseous): Secondary | ICD-10-CM | POA: Diagnosis not present

## 2020-07-06 DIAGNOSIS — I700 Atherosclerosis of aorta: Secondary | ICD-10-CM | POA: Diagnosis not present

## 2020-07-06 DIAGNOSIS — R194 Change in bowel habit: Secondary | ICD-10-CM | POA: Diagnosis not present

## 2020-07-06 DIAGNOSIS — R1031 Right lower quadrant pain: Secondary | ICD-10-CM

## 2020-07-06 DIAGNOSIS — K439 Ventral hernia without obstruction or gangrene: Secondary | ICD-10-CM | POA: Diagnosis not present

## 2020-07-06 DIAGNOSIS — K469 Unspecified abdominal hernia without obstruction or gangrene: Secondary | ICD-10-CM | POA: Diagnosis not present

## 2020-07-06 DIAGNOSIS — Z90710 Acquired absence of both cervix and uterus: Secondary | ICD-10-CM | POA: Diagnosis not present

## 2020-07-06 LAB — POCT I-STAT CREATININE: Creatinine, Ser: 0.7 mg/dL (ref 0.44–1.00)

## 2020-07-06 MED ORDER — SODIUM CHLORIDE (PF) 0.9 % IJ SOLN
INTRAMUSCULAR | Status: AC
  Filled 2020-07-06: qty 50

## 2020-07-06 MED ORDER — IOHEXOL 300 MG/ML  SOLN
100.0000 mL | Freq: Once | INTRAMUSCULAR | Status: AC | PRN
  Administered 2020-07-06: 100 mL via INTRAVENOUS

## 2020-07-17 DIAGNOSIS — Z01419 Encounter for gynecological examination (general) (routine) without abnormal findings: Secondary | ICD-10-CM | POA: Diagnosis not present

## 2020-07-17 DIAGNOSIS — Z6827 Body mass index (BMI) 27.0-27.9, adult: Secondary | ICD-10-CM | POA: Diagnosis not present

## 2020-08-01 MED FILL — ESZOPICLONE 3 MG TABS: 3 | 30 days supply | Qty: 30 | Fill #0

## 2020-08-01 MED FILL — SYNTHROID 100 MCG TABLET: 100 | 90 days supply | Qty: 90 | Fill #1

## 2020-08-02 MED FILL — valACYclovir HCL 1 GM TABS: 1 | 7 days supply | Qty: 15 | Fill #0

## 2020-08-03 ENCOUNTER — Ambulatory Visit: Payer: 59 | Admitting: Podiatry

## 2020-08-24 DIAGNOSIS — H524 Presbyopia: Secondary | ICD-10-CM | POA: Diagnosis not present

## 2020-08-29 ENCOUNTER — Encounter: Payer: Self-pay | Admitting: Podiatry

## 2020-08-29 ENCOUNTER — Other Ambulatory Visit: Payer: Self-pay

## 2020-08-29 ENCOUNTER — Ambulatory Visit: Payer: 59 | Admitting: Podiatry

## 2020-08-29 ENCOUNTER — Ambulatory Visit: Payer: 59 | Attending: Internal Medicine

## 2020-08-29 DIAGNOSIS — M79675 Pain in left toe(s): Secondary | ICD-10-CM

## 2020-08-29 DIAGNOSIS — Z23 Encounter for immunization: Secondary | ICD-10-CM

## 2020-08-29 DIAGNOSIS — G8929 Other chronic pain: Secondary | ICD-10-CM

## 2020-08-29 DIAGNOSIS — M7752 Other enthesopathy of left foot: Secondary | ICD-10-CM | POA: Diagnosis not present

## 2020-08-29 DIAGNOSIS — M205X2 Other deformities of toe(s) (acquired), left foot: Secondary | ICD-10-CM | POA: Diagnosis not present

## 2020-08-29 DIAGNOSIS — E039 Hypothyroidism, unspecified: Secondary | ICD-10-CM

## 2020-08-29 DIAGNOSIS — M21272 Flexion deformity, left ankle and toes: Secondary | ICD-10-CM

## 2020-08-29 NOTE — Progress Notes (Signed)
° °  Covid-19 Vaccination Clinic  Name:  Jane Gutierrez    MRN: [id number] DOB: [birth year]  08/29/2020  Jane Gutierrez was observed post Covid-19 immunization for 30 minutes based on pre-vaccination screening without incident. She was provided with Vaccine Information Sheet and instruction to access the V-Safe system.   Jane Gutierrez was instructed to call 911 with any severe reactions post vaccine:  Difficulty breathing   Swelling of face and throat   A fast heartbeat   A bad rash all over body   Dizziness and weakness

## 2020-08-29 NOTE — Progress Notes (Signed)
  Subjective:  Patient ID: Jane Gutierrez, female    DOB: 1956/07/30,  MRN: [id number]  Chief Complaint  Patient presents with  . Capsulitis of metatarsophalangeal    left foot, great toe joint," ready for surgery"(consult today)    65 y.o. female presents with the above complaint. History confirmed with patient.  Still has pain in the big toe left foot and issues with the left 2nd toe rubbing against the first toe.  Objective:  Physical Exam: warm, good capillary refill, no trophic changes or ulcerative lesions, normal DP and PT pulses and normal sensory exam. Left Foot: Pain to palpation at the first and second metatarsophalangeal joints with crepitus on range of motion first MPJ. 2nd hammertoe with medial drift.  No images are attached to the encounter.  Radiographs 4/9: X-ray of the left foot: Uneven joint space narrowing of the first MPJ, met primus elevatus with dorsal exostosis  Assessment:   1. Capsulitis of metatarsophalangeal (MTP) joint of left foot   2. Hallux limitus of left foot   3. Chronic toe pain, left foot   4. Metatarsus primus elevatus, left   5. Hypothyroidism, unspecified type    Plan:  Patient was evaluated and treated and all questions answered.  Arthritis first MPJ, 2nd hammertoe left -Prior XR again reviewed with patient -Patent would like to proceed with surgery -Patient has failed all conservative therapy and wishes to proceed with surgical intervention. All risks, benefits, and alternatives discussed with patient. No guarantees given. Consent reviewed and signed by patient. -Planned procedures: left foot 1st MPJ joint replacement with implant, 2nd hammertoe correction with pin fixation.  -Risk factors: Hypothyroidism  No follow-ups on file.

## 2020-09-04 DIAGNOSIS — L821 Other seborrheic keratosis: Secondary | ICD-10-CM | POA: Diagnosis not present

## 2020-09-04 DIAGNOSIS — D229 Melanocytic nevi, unspecified: Secondary | ICD-10-CM | POA: Diagnosis not present

## 2020-09-04 DIAGNOSIS — L820 Inflamed seborrheic keratosis: Secondary | ICD-10-CM | POA: Diagnosis not present

## 2020-09-04 DIAGNOSIS — L812 Freckles: Secondary | ICD-10-CM | POA: Diagnosis not present

## 2020-09-04 DIAGNOSIS — L578 Other skin changes due to chronic exposure to nonionizing radiation: Secondary | ICD-10-CM | POA: Diagnosis not present

## 2020-09-04 DIAGNOSIS — L814 Other melanin hyperpigmentation: Secondary | ICD-10-CM | POA: Diagnosis not present

## 2020-09-27 MED FILL — ESZOPICLONE 3 MG TABS: 3 | 30 days supply | Qty: 30 | Fill #1

## 2020-09-28 ENCOUNTER — Other Ambulatory Visit (HOSPITAL_COMMUNITY): Payer: Self-pay | Admitting: Obstetrics & Gynecology

## 2020-09-28 MED FILL — VALACYCLOVIR HCL 500 MG TAB: 500 | 15 days supply | Qty: 30 | Fill #0

## 2020-10-06 ENCOUNTER — Other Ambulatory Visit: Payer: Self-pay

## 2020-10-06 ENCOUNTER — Ambulatory Visit: Payer: 59 | Admitting: Podiatry

## 2020-10-06 DIAGNOSIS — M7752 Other enthesopathy of left foot: Secondary | ICD-10-CM

## 2020-10-23 NOTE — Progress Notes (Signed)
  Subjective:  Patient ID: Jane Gutierrez, female    DOB: 06-01-1956,  MRN: [id number]  Chief Complaint  Patient presents with  . Foot Pain    requested an injection     64 y.o. female presents with the above complaint.  Is pending surgery but would like to get an injection today until her surgery date Objective:  Physical Exam: warm, good capillary refill, no trophic changes or ulcerative lesions, normal DP and PT pulses and normal sensory exam. Left Foot: Pain to palpation at the first and second metatarsophalangeal joints with crepitus on range of motion first MPJ. 2nd hammertoe with medial drift.  No images are attached to the encounter.  Radiographs 4/9: X-ray of the left foot: Uneven joint space narrowing of the first MPJ, met primus elevatus with dorsal exostosis  Assessment:   1. Capsulitis of metatarsophalangeal (MTP) joint of left foot    Plan:  Patient was evaluated and treated and all questions answered.  Arthritis first MPJ, 2nd hammertoe left -Pending the below surgery however request injection today.  Injection delivered as below -Planned procedures: left foot 1st MPJ joint replacement with implant, 2nd hammertoe correction with pin fixation.  Procedure: Joint Injection Location: Left 1st MPJ joint Skin Prep: Alcohol. Injectate: 0.5 cc 1% lidocaine plain, 0.5 cc dexamethasone phosphate. Disposition: Patient tolerated procedure well. Injection site dressed with a band-aid.    No follow-ups on file.

## 2020-10-26 ENCOUNTER — Other Ambulatory Visit (HOSPITAL_COMMUNITY): Payer: Self-pay | Admitting: Gastroenterology

## 2020-10-26 DIAGNOSIS — K9041 Non-celiac gluten sensitivity: Secondary | ICD-10-CM | POA: Diagnosis not present

## 2020-10-26 DIAGNOSIS — Z86010 Personal history of colonic polyps: Secondary | ICD-10-CM | POA: Diagnosis not present

## 2020-10-26 DIAGNOSIS — Z1211 Encounter for screening for malignant neoplasm of colon: Secondary | ICD-10-CM | POA: Diagnosis not present

## 2020-10-26 MED FILL — CLENPIQ 10-3.5-12 MG-GM -GM: 10-3.5-12 M | 1 days supply | Qty: 320 | Fill #0

## 2020-10-30 ENCOUNTER — Other Ambulatory Visit (HOSPITAL_COMMUNITY): Payer: Self-pay | Admitting: Specialist

## 2020-10-30 MED FILL — DEXTROAMPHETAMINE 10 MG TAB: 10 | 90 days supply | Qty: 360 | Fill #0

## 2020-10-30 MED FILL — ESZOPICLONE 3 MG TABS: 3 | 30 days supply | Qty: 30 | Fill #2

## 2020-10-30 MED FILL — SYNTHROID 100 MCG TABLET: 100 | 90 days supply | Qty: 90 | Fill #2

## 2020-11-02 ENCOUNTER — Encounter: Payer: Self-pay | Admitting: Plastic Surgery

## 2020-11-02 ENCOUNTER — Other Ambulatory Visit: Payer: Self-pay

## 2020-11-02 ENCOUNTER — Ambulatory Visit (INDEPENDENT_AMBULATORY_CARE_PROVIDER_SITE_OTHER): Payer: Self-pay | Admitting: Plastic Surgery

## 2020-11-02 VITALS — BP 155/76 | HR 93

## 2020-11-02 DIAGNOSIS — Z411 Encounter for cosmetic surgery: Secondary | ICD-10-CM

## 2020-11-02 NOTE — Progress Notes (Signed)
Patient presents to discuss Botox treatment.  We did this several months ago when she was happy with the result at that time.  At that visit we used 32 units across the forehead, glabella and crows feet.  She would like this to be done again.  She also got filler around that time and has been really happy with the results of that.  The risks and benefits of Botox injection were discussed with the patient and she agreed to proceed.  The forehead, glabella and crows feet were prepped with an alcohol pad.  32 units were distributed throughout.  She tolerated this well.  I have asked her to make a 2-week touchup visit in the event she needs it otherwise we will see her next time.

## 2020-11-07 ENCOUNTER — Telehealth: Payer: Self-pay

## 2020-11-07 NOTE — Telephone Encounter (Signed)
DOS 11/22/2020  KELLER BUNION IMPLANT LT - 71165 HAMMERTOE REPAIR 2ND LT - 79038  [facility] EFFECTIVE DATE - 12/24/2019  PLAN DEDUCTIBLE - $300.00 W/ $0.00 REMAINING OUT OF POCKET - $7900.00 W/ $3338.32 REMAINING COPAY $0.00 COINSURANCE - 60%  SPOKE TO [facility] AT [facility], SHE STATED NO PRECERT REQUIRED FOR CPT [phone] [place] 606[address]. CALL REF # [id number]

## 2020-11-15 ENCOUNTER — Other Ambulatory Visit: Payer: Self-pay

## 2020-11-15 ENCOUNTER — Encounter: Payer: Self-pay | Admitting: Plastic Surgery

## 2020-11-15 ENCOUNTER — Ambulatory Visit (INDEPENDENT_AMBULATORY_CARE_PROVIDER_SITE_OTHER): Payer: Self-pay | Admitting: Plastic Surgery

## 2020-11-15 VITALS — BP 143/85 | HR 107

## 2020-11-15 DIAGNOSIS — Z411 Encounter for cosmetic surgery: Secondary | ICD-10-CM

## 2020-11-15 NOTE — Progress Notes (Signed)
Patient is here for Botox touchup.  Her last injection was approximately 2 weeks ago.  She is noticed to some asymmetries in her forehead.  It looks like her right lateral brow goes up slightly higher than her left side.  The left glabellar area also appears to need a little bit more.  We reviewed the risks and benefits of Botox injection and she is in agreement to proceed.  A total of 8 units of Botox were distributed between the left glabellar area.  The right lateral forehead got a little bit also.  I also put a little bit more in the central upper forehead as there was some wrinkling in that area.  This should straighten out any asymmetries and we will plan to see her again as needed.

## 2020-11-20 MED FILL — VALACYCLOVIR HCL 500 MG TAB: 500 | 15 days supply | Qty: 30 | Fill #1

## 2020-11-22 ENCOUNTER — Other Ambulatory Visit: Payer: Self-pay | Admitting: Podiatry

## 2020-11-22 ENCOUNTER — Encounter: Payer: Self-pay | Admitting: Podiatry

## 2020-11-22 DIAGNOSIS — M25572 Pain in left ankle and joints of left foot: Secondary | ICD-10-CM | POA: Diagnosis not present

## 2020-11-22 DIAGNOSIS — M2042 Other hammer toe(s) (acquired), left foot: Secondary | ICD-10-CM | POA: Diagnosis not present

## 2020-11-22 DIAGNOSIS — M205X2 Other deformities of toe(s) (acquired), left foot: Secondary | ICD-10-CM | POA: Diagnosis not present

## 2020-11-22 DIAGNOSIS — M2022 Hallux rigidus, left foot: Secondary | ICD-10-CM | POA: Diagnosis not present

## 2020-11-22 MED ORDER — CEPHALEXIN 500 MG PO CAPS: 500.0000 mg | ORAL_CAPSULE | Freq: Two times a day (BID) | ORAL | 0 refills | Status: DC

## 2020-11-22 MED ORDER — ONDANSETRON HCL 4 MG PO TABS: 4.0000 mg | ORAL_TABLET | Freq: Three times a day (TID) | ORAL | 0 refills | Status: DC | PRN

## 2020-11-22 MED ORDER — OXYCODONE-ACETAMINOPHEN 5-325 MG PO TABS: 1.0000 | ORAL_TABLET | ORAL | 0 refills | Status: DC | PRN

## 2020-11-22 MED FILL — OXYCODONE-APAP 5-325MG: 5-325 | 2 days supply | Qty: 12 | Fill #0

## 2020-11-22 MED FILL — ONDANSETRON HCL 4 MG TABLET: 4 | 7 days supply | Qty: 20 | Fill #0

## 2020-11-22 MED FILL — CEPHALEXIN 500 MG CAPSULE: 500 | 7 days supply | Qty: 14 | Fill #0

## 2020-11-22 NOTE — Progress Notes (Signed)
Rx sent to pharmacy for outpatient surgery. °

## 2020-11-23 ENCOUNTER — Telehealth: Payer: Self-pay | Admitting: Podiatry

## 2020-11-23 NOTE — Telephone Encounter (Signed)
Called and spoke to patient for post-op check. Denied issues or concerns. Pain tolerated with medication

## 2020-11-27 DIAGNOSIS — Z1211 Encounter for screening for malignant neoplasm of colon: Secondary | ICD-10-CM | POA: Diagnosis not present

## 2020-11-28 ENCOUNTER — Ambulatory Visit (INDEPENDENT_AMBULATORY_CARE_PROVIDER_SITE_OTHER): Payer: 59 | Admitting: Podiatry

## 2020-11-28 ENCOUNTER — Other Ambulatory Visit: Payer: Self-pay

## 2020-11-28 ENCOUNTER — Ambulatory Visit (INDEPENDENT_AMBULATORY_CARE_PROVIDER_SITE_OTHER): Payer: 59

## 2020-11-28 DIAGNOSIS — M205X2 Other deformities of toe(s) (acquired), left foot: Secondary | ICD-10-CM

## 2020-11-28 NOTE — Progress Notes (Signed)
  Subjective:  Patient ID: Jane Gutierrez, female    DOB: 08/31/1956,  MRN: [id number]  Chief Complaint  Patient presents with  . Routine Post Op    POV#1 DOS 12.1.2021 KELLER BUNION IMPLANT LT; HAMMERTOE REPAIR 2ND. Pt states not having much pain, has questions about activity level.   DOS: 11/22/20 Procedure: Jake Michaelis bunion with implant, left, repair 2nd hammertoe  64 y.o. female presents with the above complaint. History confirmed with patient. Not having much pain, it is well controlled.  Objective:  Physical Exam: tenderness at the surgical site, local edema noted and calf supple, nontender. Incision: healing well, no significant drainage, no dehiscence, no significant erythema  No images are attached to the encounter.  Radiographs: X-ray of the left foot: consistent with post-op state, hardware and implant intact, stable.   Assessment:   1. Hallux limitus of left foot     Plan:  Patient was evaluated and treated and all questions answered.  Post-operative State -XR reviewed with patient -Dressing applied consisting of sterile gauze, kerlix and ACE bandage -WBAT in CAM boot  Return in about 1 week (around 12/05/2020) for Post-Op (No XRs).

## 2020-12-05 ENCOUNTER — Other Ambulatory Visit: Payer: Self-pay

## 2020-12-05 ENCOUNTER — Ambulatory Visit (INDEPENDENT_AMBULATORY_CARE_PROVIDER_SITE_OTHER): Payer: 59 | Admitting: Podiatry

## 2020-12-05 DIAGNOSIS — D485 Neoplasm of uncertain behavior of skin: Secondary | ICD-10-CM | POA: Diagnosis not present

## 2020-12-05 DIAGNOSIS — M205X2 Other deformities of toe(s) (acquired), left foot: Secondary | ICD-10-CM

## 2020-12-05 DIAGNOSIS — M7752 Other enthesopathy of left foot: Secondary | ICD-10-CM

## 2020-12-05 NOTE — Progress Notes (Signed)
  Subjective:  Patient ID: Jane Gutierrez, female    DOB: [birth year],  MRN: [id number]  Chief Complaint  Patient presents with  . Routine Post Op    Pov#2 Pt denies any concerns, denies fever/nausea/vomiting/chills.   DOS: 11/22/20 Procedure: Jake Michaelis bunion with implant, left, repair 2nd hammertoe  64 y.o. female presents with the above complaint. History confirmed with patient. Continus to do well. No pain, minimal swelling. Is trying to be active with upper body activities.  Objective:  Physical Exam: no tenderness at the surgical site, local edema noted and calf supple, nontender. Incision: healing well, no significant drainage, no dehiscence, no significant erythema  No images are attached to the encounter.  Radiographs: X-ray of the left foot: consistent with post-op state, hardware and implant intact, stable.   Assessment:   1. Hallux limitus of left foot   2. Capsulitis of metatarsophalangeal (MTP) joint of left foot     Plan:  Patient was evaluated and treated and all questions answered.  Post-operative State -Sutures removed -Steri-strips applied to the incision -WBAT in CAM boot -XR in 2 weeks with likely pin removal  No follow-ups on file.

## 2020-12-19 ENCOUNTER — Ambulatory Visit (INDEPENDENT_AMBULATORY_CARE_PROVIDER_SITE_OTHER): Payer: 59

## 2020-12-19 ENCOUNTER — Ambulatory Visit (INDEPENDENT_AMBULATORY_CARE_PROVIDER_SITE_OTHER): Payer: 59 | Admitting: Podiatry

## 2020-12-19 ENCOUNTER — Other Ambulatory Visit: Payer: Self-pay | Admitting: Podiatry

## 2020-12-19 ENCOUNTER — Other Ambulatory Visit: Payer: Self-pay

## 2020-12-19 DIAGNOSIS — M2042 Other hammer toe(s) (acquired), left foot: Secondary | ICD-10-CM

## 2020-12-19 DIAGNOSIS — M79672 Pain in left foot: Secondary | ICD-10-CM

## 2020-12-19 MED FILL — ALBUTEROL SULFATE HFA 108 (: 108 (90 BAS | 25 days supply | Qty: 18 | Fill #0

## 2020-12-19 NOTE — Progress Notes (Signed)
  Subjective:  Patient ID: Jane Gutierrez, female    DOB: 1956/11/18,  MRN: [id number]  Chief Complaint  Patient presents with  . Routine Post Op    POV#3 Pt states no concerns, denies fever/chills/nausea/vomiting.   DOS: 11/22/20 Procedure: Lorenz Coaster bunion with implant, left, repair 2nd hammertoe  64 y.o. female presents with the above complaint. History confirmed with patient. Denies new issues, doing well post-op. Wearing boot as directed.  Objective:  Physical Exam: no tenderness at the surgical site, local edema noted and calf supple, nontender. Incision: healed with small dorsal crust area  No images are attached to the encounter.  Radiographs: X-ray of the left foot: consistent with post-op state. Bridging across 2nd PIPJ. Stable implant arthroplasty 1st MPJ  Assessment:   1. Hammertoe of left foot     Plan:  Patient was evaluated and treated and all questions answered.  Post-operative State -XR reviewed with patient. Pin pulled today. -Ok to start showering at this time. Advised they cannot soak. -WBAT in Surgical shoe -Transition to normal shoegear in 2 weeks  Return in about 1 month (around 01/19/2021) for Post-Op (with XRs).

## 2021-01-05 ENCOUNTER — Other Ambulatory Visit: Payer: Self-pay

## 2021-01-05 ENCOUNTER — Other Ambulatory Visit: Payer: Self-pay | Admitting: Adult Health

## 2021-01-05 ENCOUNTER — Inpatient Hospital Stay: Payer: 59 | Attending: Adult Health | Admitting: Adult Health

## 2021-01-05 DIAGNOSIS — Z9013 Acquired absence of bilateral breasts and nipples: Secondary | ICD-10-CM | POA: Insufficient documentation

## 2021-01-05 DIAGNOSIS — C50412 Malignant neoplasm of upper-outer quadrant of left female breast: Secondary | ICD-10-CM | POA: Diagnosis not present

## 2021-01-05 DIAGNOSIS — Z170 Estrogen receptor positive status [ER+]: Secondary | ICD-10-CM | POA: Insufficient documentation

## 2021-01-05 DIAGNOSIS — Z79899 Other long term (current) drug therapy: Secondary | ICD-10-CM | POA: Insufficient documentation

## 2021-01-05 MED ORDER — ESZOPICLONE 3 MG PO TABS: ORAL_TABLET | ORAL | 5 refills | Status: DC

## 2021-01-05 MED FILL — ESZOPICLONE 3 MG TABS: 3 | 30 days supply | Qty: 30 | Fill #0

## 2021-01-05 NOTE — Progress Notes (Signed)
CLINIC:  Survivorship   REASON FOR VISIT:  Routine follow-up for history of breast cancer.   BRIEF ONCOLOGIC HISTORY:  Oncology History  Breast cancer, left breast ([place]) (Resolved)   Initial Diagnosis   Breast cancer, left breast   12/04/2012 Initial Biopsy   Left breast needle core biopsy (UOQ): Grade 3, DCIS with necrosis and calcs. ER+ (100%), PR+ (86%).    Breast cancer of upper-outer quadrant of left female breast ([place])  12/04/2012 Initial Biopsy   LEFT breast needle core biopsy (UOQ): Grade 3, DCIS with necrosis and calcs. ER+ (100%), PR+ (86%).    12/14/2012 Breast MRI   LEFT breast: Clumped nodular enhancement measuring 7.2 x 3.2 x 2.6 cm. Also associated post-biopsy change. RIGHT breast: At 12 o'clock location, there is 19mm slightly irregular mass-like area of abnml enhancement. No lymphadenopathy.    12/25/2012 Initial Biopsy   RIGHT breast needle core biopsy (upper centeral): Grade 1-2, IDC, DCIS with calcs. ER+ (100%), PR+ (100%), HER2- (ratio 1.23). Ki67 4%.    12/25/2012 Initial Diagnosis   Bilateral breast cancer   12/31/2012 Surgery   Bilateral mastectomies with bilat SLNB [facility]). LEFT: Grade 2, IDC, 2.3 cm. (+) LVI. Grade 3 DCIS w/comedonecrosis & calcs. (-) margins. 1 left ax SLN neg.  ER+ (99%), PR+ (10%), HER2 repeated and (+) ratio (6.81). Ki67 46%. RIGHT: Benign, 1 SLN neg.   12/31/2012 Pathologic Stage   LEFT breast: pT2, pN0, pMx. Stage IIA.    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 EF 55-60%   02/04/2013 - 05/13/2013 Adjuvant Chemotherapy   Taxotere/Carbo/Herceptin q 3 wks x 5 cycles completed. (planned for 6 cycles but d/c'd early due to pt wishes and recent infection).    05/2013 -  Anti-estrogen oral therapy   Attempted to take Letrozole, Aromasin, & Tamoxifen but patient could not tolerate any anti-estrogen therapy due to severe depressive symptoms and  joint/muscle aches & pains.  No longer taking anti-estrogen therapy.   09/29/2013 Surgery   LEFT breast reconstruction with lat flap and placement of tissue expander ([facility]).    11/26/2013 Imaging   CT chest: No definite CT findings for chest wall recurrence and no suspicious supraclavicular or axillary adenopathy.  Negative for metastatic disease in lungs or bones.     - 02/16/2014 Adjuvant Chemotherapy   Herceptin maintenance x 1 year completed.    03/09/2014 Surgery   Removal of bilateral tissue expanders and placement of bilateral breast implants ([facility]).    03/09/2014 Procedure   Right scar biopsy: No malignancy & Left breast capsule biopsy: No evidence of malignancy.    08/22/2014 Echocardiogram   Post-treatment. EF 55-60%   06/30/2015 Survivorship   Survivorship Care Plan given to patient and reviewed with her during in-person visit.       INTERVAL HISTORY:  Jane Gutierrez presents to the Survivorship Clinic today for routine follow-up for her history of breast cancer.    Jane Gutierrez continues to do well.  She reminded me that her sister ister was diagnosed with CLL last year and is also check 2 positive.  Jane Gutierrez is exercising regularly.  She is very active.    Jane Gutierrez is up to date with her skin cancer, colon cancer, and gyn cancer screenings.  She is requesting a refill for her lunesta today.   REVIEW OF SYSTEMS:  Review of Systems  Constitutional: Negative for appetite change, chills, fatigue, fever and unexpected weight change.  HENT:  Negative for hearing loss, lump/mass, nosebleeds, sore throat, tinnitus and trouble swallowing.   Eyes: Negative for eye problems and icterus.  Respiratory: Negative for chest tightness, cough and shortness of breath.   Cardiovascular: Negative for chest pain, leg swelling and palpitations.  Gastrointestinal: Negative for abdominal distention, abdominal pain, blood in stool, constipation, diarrhea, nausea and vomiting.       Has h/o IBS   Endocrine:  Negative for hot flashes.  Genitourinary: Negative for difficulty urinating.   Musculoskeletal: Negative for arthralgias.  Skin: Negative for itching and rash.  Neurological: Positive for numbness. Negative for dizziness, extremity weakness and headaches.  Hematological: Negative for adenopathy. Does not bruise/bleed easily.  Psychiatric/Behavioral: Positive for sleep disturbance (controlled with Lunesta at night time). Negative for depression. The patient is not nervous/anxious.   Breast: Denies any new nodularity, masses, tenderness, nipple changes, or nipple discharge.     PAST MEDICAL/SURGICAL HISTORY:  Past Medical History:  Diagnosis Date  . Abnormally small mouth   . ADD (attention deficit disorder)   . ADHD   . Allergy   . Anxiety    pt reported she doesn't have anxiety  . Asthma    prn inhaler  . Blood transfusion without reported diagnosis   . Breast cancer ([place])   . Dental crowns present   . Difficulty swallowing pills   . History of breast cancer   . History of cervical cancer   . History of palpitations    last echo 08/22/2014  . Hypothyroidism   . IBS (irritable bowel syndrome)    Past Surgical History:  Procedure Laterality Date  . ABDOMINAL HYSTERECTOMY    . ABDOMINOPLASTY  03/28/2004  . AXILLARY SENTINEL NODE BIOPSY  12/31/2012   Procedure: AXILLARY SENTINEL NODE BIOPSY;  Surgeon: Rolm Bookbinder, MD;  Location: [facility];  Service: General;  Laterality: Bilateral;  bilateral sentinel node  . BRAIN SURGERY     removal benign frontal lobe cyst in 1987  . BREAST ENHANCEMENT SURGERY Bilateral ~ 2008  . BREAST IMPLANT EXCHANGE Bilateral 12/11/2017   Procedure: BILATERAL IMPLANT EXCHANGE;  Surgeon: Wallace Going, DO;  Location: [facility];  Service: Plastics;  Laterality: Bilateral;  . BREAST RECONSTRUCTION WITH PLACEMENT OF TISSUE EXPANDER AND FLEX HD (ACELLULAR HYDRATED DERMIS)  12/31/2012   Procedure: BREAST RECONSTRUCTION  WITH PLACEMENT OF TISSUE EXPANDER AND FLEX HD (ACELLULAR HYDRATED DERMIS);  Surgeon: Theodoro Kos, DO;  Location: [facility];  Service: Plastics;  Laterality: Bilateral;  bilateral immediate breast reconstruction with expanders and flex hd   . CAPSULOTOMY Left 09/01/2014   Procedure: CAPSULOTOMY OF LEFT BREAST WITH LIPOFILLING FOR SYMMETRY;  Surgeon: Theodoro Kos, DO;  Location: [facility];  Service: Plastics;  Laterality: Left;  . CERVICAL CERCLAGE    . DIAGNOSTIC LAPAROSCOPY  05/12/2003   right partial salpingectomy; lysis paraovarian adhesions left  . DILATION AND CURETTAGE OF UTERUS  2005  . ENDOMETRIAL FULGURATION  05/12/2003  . FACIAL COSMETIC SURGERY  ~ 2010  . FOOT SURGERY Bilateral 07/2012  . GYNECOLOGIC CRYOSURGERY  1998  . INCISION AND DRAINAGE OF WOUND Left 04/28/2013   Procedure: IRRIGATION AND DEBRIDEMENT LEFT BREAST WOUND, POSSIBLE CLOSURE OF WOUND WITH DRAIN PLACEMENT;  Surgeon: Theodoro Kos, DO;  Location: [facility];  Service: Plastics;  Laterality: Left;  . LATISSIMUS FLAP TO BREAST Left 09/29/2013   Procedure: LATISSIMUS FLAP TO BREAST WITH TISSUE EXPANDER PLACEMENT FOR LEFT BREAST RECONSTRUCTION;  Surgeon: Theodoro Kos, DO;  Location:  [facility];  Service: Plastics;  Laterality: Left;  . LIPECTOMY Bilateral 03/28/2004   hip  . LIPOSUCTION Bilateral 03/09/2014   Procedure: LIPOSUCTION ABDOMEN W/ LIPOFILLING LATERAL BILATERAL BREAST;  Surgeon: Theodoro Kos, DO;  Location: [facility];  Service: Plastics;  Laterality: Bilateral;  LIPOSUCTION NECK  . LIPOSUCTION WITH LIPOFILLING Bilateral 09/01/2014   Procedure: LIPOSUCTION WITH LIPOFILLING;  Surgeon: Theodoro Kos, DO;  Location: [facility];  Service: Plastics;  Laterality: Bilateral;  . LYSIS OF ADHESION  03/28/2004   pelvic  . MASTECTOMY    . PORT-A-CATH REMOVAL Right 03/09/2014   Procedure: REMOVAL PORT-A-CATH;  Surgeon: Theodoro Kos, DO;  Location: [facility];  Service: Plastics;  Laterality: Right;  . PORTACATH PLACEMENT  01/27/2013   Procedure: INSERTION PORT-A-CATH;  Surgeon: Rolm Bookbinder, MD;  Location: [facility];  Service: General;  Laterality: Right;  . RADIOFREQUENCY ABLATION NERVES  05/12/2003   uterosacral nerve ablation  . REMOVAL OF BILATERAL TISSUE EXPANDERS WITH PLACEMENT OF BILATERAL BREAST IMPLANTS Bilateral 03/09/2014   Procedure: REMOVAL OF BILATERAL TISSUE EXPANDERS WITH PLACEMENT OF BILATERAL BREAST IMPLANTS;  Surgeon: Theodoro Kos, DO;  Location: [facility];  Service: Plastics;  Laterality: Bilateral;  . TEE WITHOUT CARDIOVERSION N/A 06/04/2013   Procedure: TRANSESOPHAGEAL ECHOCARDIOGRAM (TEE);  Surgeon: Jolaine Artist, MD;  Location[facility] END[facility];  Service: Cardiovascular;  Laterality: N/A;  . TISSUE EXPANDER PLACEMENT Left 06/16/2013   Procedure: LEFT BREAST TISSUE EXPANDER REMOVAL;  Surgeon: Theodoro Kos, DO;  Location: [facility];  Service: Plastics;  Laterality: Left;  . TISSUE EXPANDER PLACEMENT Left 09/29/2013   Procedure: TISSUE EXPANDER;  Surgeon: Theodoro Kos, DO;  Location: [facility];  Service: Plastics;  Laterality: Left;  . TOTAL ABDOMINAL HYSTERECTOMY W/ BILATERAL SALPINGOOPHORECTOMY  03/28/2004  . TOTAL MASTECTOMY  12/31/2012   Procedure: TOTAL MASTECTOMY;  Surgeon: Rolm Bookbinder, MD;  Location: [facility];  Service: General;  Laterality: Bilateral;  bilateral total mastectomies     ALLERGIES:  Allergies  Allergen Reactions  . Betadine [Povidone Iodine] Anaphylaxis  . Doxycycline Rash  . Iodine Anaphylaxis  . Shellfish-Derived Products Anaphylaxis  . Cleocin [Clindamycin Hcl] Rash  . Ginger Rash  . Penicillins Rash  . Sulfamethoxazole-Trimethoprim Rash     CURRENT MEDICATIONS:  Outpatient Encounter Medications as of 01/05/2021  Medication Sig Note  . albuterol (PROVENTIL) (2.5 MG/3ML) 0.083% nebulizer solution Take 2.5 mg by  nebulization every 6 (six) hours as needed for wheezing or shortness of breath.   . Biotin 1 MG CAPS Take 1 capsule by mouth daily.   [name] dextroamphetamine (DEXTROSTAT) 10 MG tablet Take 20 mg by mouth 2 (two) times daily.   [name] EPINEPHrine 0.3 mg/0.3 mL IJ SOAJ injection Inject 0.3 mLs (0.3 mg total) into the muscle once.   . Eszopiclone 3 MG TABS TAKE 1 TABLET BY MOUTH IMMEDIATELY BEFORE BEDTIME   . hyoscyamine (ANASPAZ) 0.125 MG TBDP disintergrating tablet Place 0.125 mg under the tongue as needed.    . Multiple Vitamin (MULTIVITAMIN WITH MINERALS) TABS Take 1 tablet by mouth daily.   . nitrofurantoin, macrocrystal-monohydrate, (MACROBID) 100 MG capsule Take 1 capsule (100 mg total) by mouth 2 (two) times daily.   [name] SHINGRIX injection    . SYNTHROID 100 MCG tablet TAKE 1 TABLET BY MOUTH DAILY.   . valACYclovir (VALTREX) 500 MG tablet Take 250 mg by mouth daily.    . vitamin E 600 UNIT capsule Take 800 Units by mouth daily.  04/06/2018: Reports she only takes $RemoveBe'400mg'[id number]$  bid   . XIFAXAN 550 MG TABS tablet Take 550 mg by mouth 2 (two) times daily.   . [DISCONTINUED] cephALEXin (KEFLEX) 500 MG capsule Take 1 capsule (500 mg total) by mouth 2 (two) times daily.   . [DISCONTINUED] CLENPIQ 10-3.5-12 MG-GM -GM/160ML SOLN Take by mouth as directed.   . [DISCONTINUED] dextroamphetamine (DEXTROSTAT) 5 MG tablet Take 5 mg by mouth 4 (four) times daily as needed.    . [DISCONTINUED] ondansetron (ZOFRAN) 4 MG tablet Take 1 tablet (4 mg total) by mouth every 8 (eight) hours as needed for nausea or vomiting.   . [DISCONTINUED] oxyCODONE-acetaminophen (PERCOCET) 5-325 MG tablet Take 1 tablet by mouth every 4 (four) hours as needed for severe pain.   . [DISCONTINUED] valACYclovir (VALTREX) 1000 MG tablet Take 1,000 mg by mouth 2 (two) times daily.    No facility-administered encounter medications on file as of 01/05/2021.     ONCOLOGIC FAMILY HISTORY:  Family History  Problem Relation Age of Onset  . Cancer Mother  60       uterine, mult myeloma  . CVA Father   . Cancer Sister 58       breast  . Leukemia Sister        CLL  . Cancer Maternal Grandmother 3       breast  . Lupus Sister        twin sister  . Sarcoidosis Sister        twin sister  . Rheum arthritis Sister        twin sister  . Celiac disease Daughter       SOCIAL HISTORY:  Social History   Socioeconomic History  . Marital status: Married    Spouse name: Not on file  . Number of children: Not on file  . Years of education: Not on file  . Highest education level: Not on file  Occupational History  . Not on file  Tobacco Use  . Smoking status: Never Smoker  . Smokeless tobacco: Never Used  Substance and Sexual Activity  . Alcohol use: No    Alcohol/week: 0.0 standard drinks    Comment: occasionally  . Drug use: No  . Sexual activity: Yes  Other Topics Concern  . Not on file  Social History Narrative  . Not on file   Social Determinants of Health   Financial Resource Strain: Not on file  Food Insecurity: Not on file  Transportation Needs: Not on file  Physical Activity: Not on file  Stress: Not on file  Social Connections: Not on file  Intimate Partner Violence: Not on file     PHYSICAL EXAMINATION:  Vital Signs: Vitals:   01/05/21 1133  BP: 133/86  Resp: 16  Temp: 97.7 F (36.5 C)  SpO2: 100%   Filed Weights   01/05/21 1133  Weight: 152 lb 9.3 oz (69.2 kg)   General: Well-nourished, well-appearing female in no acute distress.  Unaccompanied today.   HEENT: Head is normocephalic.  Pupils equal and reactive to light. Conjunctivae clear without exudate.  Sclerae anicteric. Oral mucosa is pink, moist.  Oropharynx is pink without lesions or erythema.  Lymph: No cervical, supraclavicular, or infraclavicular lymphadenopathy noted on palpation.  Cardiovascular: Regular rate and rhythm.[name] Respiratory: Clear to auscultation bilaterally. Chest expansion symmetric; breathing non-labored.  Breast Exam:   Breasts: s/p bilateral mastectomies and implant placement.  No nodules, masses, sign of recurrence -Axilla: No axillary adenopathy bilaterally.  GI: Abdomen soft and  round; non-tender, non-distended. Bowel sounds normoactive. No hepatosplenomegaly.   GU: Deferred.  Neuro: No focal deficits. Steady gait.  Psych: Mood and affect normal and appropriate for situation.  MSK: No focal spinal tenderness to palpation, full range of motion in bilateral upper extremities Extremities: No edema. Skin: Warm and dry.  LABORATORY DATA:  None for this visit   DIAGNOSTIC IMAGING:  Most recent mammogram: n/a s/p bilateral mastectomies MRI 07/2019 negative    ASSESSMENT AND PLAN:  Ms.. Teschner is a pleasant 65 y.o. female with history of Stage IIA left breast invasive ductal carcinoma, ER+/PR+/HER2+, diagnosed in 11/2012, treated with bilateral mastectomies, adjuvant chemotherapy, and maintenance trastuzumab.  She was unable to tolerate antiestrogen therapy.  She presents to the Survivorship Clinic for surveillance and routine follow-up.   1. History of breast cancer:  Ms. Rayle is currently clinically and radiographically without evidence of disease or recurrence of breast cancer.  She is now 8 years out from her initial diagnosis and treatment.  She will transition back to f/u with gynecology for her annual breast exams.      2. Sleep pattern disturbance: Lunesta sent into [facility] electronically.  She is tolerating this well.    3. Bone health:  Given Ms. Hillenburg age, history of breast cancer, she is at risk for bone demineralization. I will defer to her PCP regarding future bone density testing and management.  She was given education on specific food and activities to promote bone health.  4. Cancer screening:  Due to Ms. Riggle history and her age, she should receive screening for skin cancers, colon cancer (more frequent due to Chek 2 positivity), and gynecologic cancers. She was  encouraged to follow-up with her PCP for appropriate cancer screenings.   5. Health maintenance and wellness promotion: Ms. Tello was encouraged to consume 5-7 servings of fruits and vegetables per day. She was also encouraged to engage in moderate to vigorous exercise for 30 minutes per day most days of the week. She was instructed to limit her alcohol consumption and continue to abstain from tobacco use.      Dispo:  -Return to cancer center PRN  Total encounter time: 20 minutes*  Wilber Bihari, NP 01/12/21 8:21 AM Medical Oncology and Hematology [facility] [place], [place] [address] Tel. [phone]    Fax. [phone]  *Total Encounter Time as defined by the Centers for Medicare and Medicaid Services includes, in addition to the face-to-face time of a patient visit (documented in the note above) non-face-to-face time: obtaining and reviewing outside history, ordering and reviewing medications, tests or procedures, care coordination (communications with other health care professionals or caregivers) and documentation in the medical record.    Note: PRIMARY CARE PROVIDER Reynold Bowen, [place] [phone]

## 2021-01-08 ENCOUNTER — Telehealth: Payer: Self-pay | Admitting: Adult Health

## 2021-01-08 NOTE — Telephone Encounter (Signed)
No 1/14 los. No changes made to pt's schedule.  

## 2021-01-15 MED FILL — VALACYCLOVIR HCL 500 MG TAB: 500 | 15 days supply | Qty: 30 | Fill #2

## 2021-01-16 DIAGNOSIS — E039 Hypothyroidism, unspecified: Secondary | ICD-10-CM | POA: Diagnosis not present

## 2021-01-16 DIAGNOSIS — E785 Hyperlipidemia, unspecified: Secondary | ICD-10-CM | POA: Diagnosis not present

## 2021-01-16 DIAGNOSIS — Z0000 Encounter for general adult medical examination without abnormal findings: Secondary | ICD-10-CM | POA: Diagnosis not present

## 2021-01-19 ENCOUNTER — Ambulatory Visit (INDEPENDENT_AMBULATORY_CARE_PROVIDER_SITE_OTHER): Payer: 59

## 2021-01-19 ENCOUNTER — Ambulatory Visit (INDEPENDENT_AMBULATORY_CARE_PROVIDER_SITE_OTHER): Payer: 59 | Admitting: Podiatry

## 2021-01-19 ENCOUNTER — Other Ambulatory Visit: Payer: Self-pay

## 2021-01-19 DIAGNOSIS — M2042 Other hammer toe(s) (acquired), left foot: Secondary | ICD-10-CM

## 2021-01-19 NOTE — Progress Notes (Signed)
  Subjective:  Patient ID: Jane Gutierrez, female    DOB: 09/26/1956,  MRN: [id number]  Chief Complaint  Patient presents with  . Routine Post Op    Pov Pt states healing well without any concerns. "I am right on schedule"   DOS: 11/22/20 Procedure: Jake Michaelis bunion with implant, left, repair 2nd hammertoe  66 y.o. female presents with the above complaint. History confirmed with patient. Able to wear some of her normal shoegear comfortably, still working on transition to sneakers.  Objective:  Physical Exam: no tenderness at the surgical site, local edema noted and calf supple, nontender. 2nd toe mild edema. Incision: healed  No images are attached to the encounter.  Radiographs: X-ray of the left foot: consistent with post-op state. Some mild interphalangeus translation but osseous bridging noted.  Assessment:   1. Hammertoe of left foot    Plan:  Patient was evaluated and treated and all questions answered.  Post-operative State -Repeat XR taken today. Good alignment. -Good ROM of the 1st MPJ -Continue normal shoegear -F/u in 6 weeks for final check. No XR needed at that time.  No follow-ups on file.

## 2021-01-22 DIAGNOSIS — E278 Other specified disorders of adrenal gland: Secondary | ICD-10-CM | POA: Diagnosis not present

## 2021-01-22 DIAGNOSIS — I700 Atherosclerosis of aorta: Secondary | ICD-10-CM | POA: Diagnosis not present

## 2021-01-22 DIAGNOSIS — C50919 Malignant neoplasm of unspecified site of unspecified female breast: Secondary | ICD-10-CM | POA: Diagnosis not present

## 2021-01-22 DIAGNOSIS — E785 Hyperlipidemia, unspecified: Secondary | ICD-10-CM | POA: Diagnosis not present

## 2021-01-22 DIAGNOSIS — E039 Hypothyroidism, unspecified: Secondary | ICD-10-CM | POA: Diagnosis not present

## 2021-01-22 DIAGNOSIS — F900 Attention-deficit hyperactivity disorder, predominantly inattentive type: Secondary | ICD-10-CM | POA: Diagnosis not present

## 2021-01-22 DIAGNOSIS — Z0000 Encounter for general adult medical examination without abnormal findings: Secondary | ICD-10-CM | POA: Diagnosis not present

## 2021-01-22 DIAGNOSIS — C539 Malignant neoplasm of cervix uteri, unspecified: Secondary | ICD-10-CM | POA: Diagnosis not present

## 2021-01-22 DIAGNOSIS — R82998 Other abnormal findings in urine: Secondary | ICD-10-CM | POA: Diagnosis not present

## 2021-01-22 DIAGNOSIS — E041 Nontoxic single thyroid nodule: Secondary | ICD-10-CM | POA: Diagnosis not present

## 2021-01-25 ENCOUNTER — Other Ambulatory Visit (HOSPITAL_COMMUNITY): Payer: Self-pay | Admitting: Endocrinology

## 2021-01-25 MED FILL — SYNTHROID 100 MCG TABLET: 100 | 90 days supply | Qty: 90 | Fill #0

## 2021-02-05 ENCOUNTER — Other Ambulatory Visit (HOSPITAL_COMMUNITY): Payer: Self-pay | Admitting: Endocrinology

## 2021-02-05 MED FILL — ALBUTEROL SULFATE HFA 108 (: 108 (90 BAS | 25 days supply | Qty: 18 | Fill #0

## 2021-02-09 ENCOUNTER — Other Ambulatory Visit (HOSPITAL_COMMUNITY): Payer: Self-pay | Admitting: Gastroenterology

## 2021-02-09 MED FILL — DEXTROAMPHETAMINE 10 MG TAB: 10 | 90 days supply | Qty: 360 | Fill #0

## 2021-02-09 MED FILL — XIFAXAN 550 MG TABLET: 550 | 14 days supply | Qty: 42 | Fill #0

## 2021-02-22 ENCOUNTER — Other Ambulatory Visit (HOSPITAL_COMMUNITY): Payer: Self-pay | Admitting: Radiology

## 2021-02-22 DIAGNOSIS — N9089 Other specified noninflammatory disorders of vulva and perineum: Secondary | ICD-10-CM | POA: Diagnosis not present

## 2021-02-22 DIAGNOSIS — R300 Dysuria: Secondary | ICD-10-CM | POA: Diagnosis not present

## 2021-02-22 DIAGNOSIS — A6000 Herpesviral infection of urogenital system, unspecified: Secondary | ICD-10-CM | POA: Insufficient documentation

## 2021-02-22 DIAGNOSIS — N760 Acute vaginitis: Secondary | ICD-10-CM | POA: Diagnosis not present

## 2021-02-22 MED FILL — valACYclovir HCL 1 GM TABS: 1 | 90 days supply | Qty: 90 | Fill #0

## 2021-02-22 MED FILL — FLUCONAZOLE 150 MG TABS: 150 | 7 days supply | Qty: 2 | Fill #0

## 2021-02-22 MED FILL — NITROFURANTOIN MONO-MCR 100: 100 | 7 days supply | Qty: 14 | Fill #0

## 2021-02-22 MED FILL — PHENAZOPYRIDINE 200 MG TAB: 200 | 3 days supply | Qty: 9 | Fill #0

## 2021-02-27 ENCOUNTER — Other Ambulatory Visit (HOSPITAL_COMMUNITY): Payer: Self-pay | Admitting: Radiology

## 2021-02-27 MED FILL — ACYCLOVIR 5% OINTMENT: 5 | 14 days supply | Qty: 15 | Fill #0

## 2021-03-02 ENCOUNTER — Encounter: Payer: Self-pay | Admitting: Podiatry

## 2021-03-02 ENCOUNTER — Other Ambulatory Visit: Payer: Self-pay

## 2021-03-02 ENCOUNTER — Ambulatory Visit (INDEPENDENT_AMBULATORY_CARE_PROVIDER_SITE_OTHER): Payer: 59 | Admitting: Podiatry

## 2021-03-02 DIAGNOSIS — M205X2 Other deformities of toe(s) (acquired), left foot: Secondary | ICD-10-CM | POA: Diagnosis not present

## 2021-03-02 DIAGNOSIS — M7752 Other enthesopathy of left foot: Secondary | ICD-10-CM

## 2021-03-02 DIAGNOSIS — M2042 Other hammer toe(s) (acquired), left foot: Secondary | ICD-10-CM

## 2021-03-02 DIAGNOSIS — I700 Atherosclerosis of aorta: Secondary | ICD-10-CM | POA: Insufficient documentation

## 2021-03-02 NOTE — Progress Notes (Signed)
  Subjective:  Patient ID: Jane Gutierrez, female    DOB: 03-15-1956,  MRN: [id number]  Chief Complaint  Patient presents with  . Routine Post Op    DOS: 11/22/20 Procedure: Jake Michaelis bunion with implant, left, repair 2nd hammertoe "The big toe is fine, but the 2nd toe is sore when I pivot"   DOS: 11/22/20 Procedure: Jake Michaelis bunion with implant, left, repair 2nd hammertoe  65 y.o. female presents with the above complaint. History confirmed with patient. Doing very well tolerating normal shoes without issue. Thinks the 2nd toe is just a little tender during certain activities.  Objective:  Physical Exam: no tenderness at the surgical site, local edema noted and calf supple, nontender. 2nd toe mild edema. Incision: healed  Assessment:   1. Hammertoe of left foot   2. Hallux limitus of left foot   3. Capsulitis of metatarsophalangeal (MTP) joint of left foot    Plan:  Patient was evaluated and treated and all questions answered.  Post-operative State -Continues to do well. Continue normal shoegear. Advised she work on 2nd MPJ ROM. At this point she can follow up only as needed if issues persist.  No follow-ups on file.

## 2021-03-14 ENCOUNTER — Encounter: Payer: Self-pay | Admitting: Plastic Surgery

## 2021-03-14 ENCOUNTER — Other Ambulatory Visit: Payer: Self-pay

## 2021-03-15 ENCOUNTER — Encounter: Payer: Self-pay | Admitting: Plastic Surgery

## 2021-03-19 MED FILL — ESZOPICLONE 3 MG TABS: 3 | 30 days supply | Qty: 30 | Fill #1

## 2021-03-22 ENCOUNTER — Ambulatory Visit (INDEPENDENT_AMBULATORY_CARE_PROVIDER_SITE_OTHER): Payer: Self-pay | Admitting: Plastic Surgery

## 2021-03-22 ENCOUNTER — Encounter: Payer: Self-pay | Admitting: Plastic Surgery

## 2021-03-22 ENCOUNTER — Other Ambulatory Visit: Payer: Self-pay

## 2021-03-22 VITALS — BP 141/97 | HR 78

## 2021-03-22 DIAGNOSIS — Z411 Encounter for cosmetic surgery: Secondary | ICD-10-CM

## 2021-03-22 NOTE — Progress Notes (Signed)
Patient presents to discuss Botox treatment.  At her last visit we did 32 units but she felt that there was a little bit more needed in the forehead so she came back a couple weeks later for additional amount in those areas.  After that she is very happy with the result.  She is interested in another treatment.  We discussed the risk and benefits of Botox treatment and she is interested in.  Proceeding.  The face was prepped with an alcohol pad and 36 units of Botox were distributed between the forehead, glabella and crows feet.  She tolerated this well.  We will plan to see her at her next visit.

## 2021-04-16 ENCOUNTER — Other Ambulatory Visit (HOSPITAL_COMMUNITY): Payer: Self-pay

## 2021-04-16 MED FILL — Levothyroxine Sodium Tab 100 MCG: ORAL | 90 days supply | Qty: 90 | Fill #0 | Status: AC

## 2021-04-16 MED FILL — Rifaximin Tab 550 MG: ORAL | 14 days supply | Qty: 42 | Fill #0 | Status: AC

## 2021-04-16 MED FILL — Eszopiclone Tab 3 MG: ORAL | 30 days supply | Qty: 30 | Fill #0 | Status: AC

## 2021-04-16 MED FILL — Dextroamphetamine Sulfate Tab 10 MG: ORAL | 90 days supply | Qty: 360 | Fill #0 | Status: AC

## 2021-04-30 ENCOUNTER — Other Ambulatory Visit (HOSPITAL_COMMUNITY): Payer: Self-pay

## 2021-04-30 MED FILL — Valacyclovir HCl Tab 500 MG: ORAL | 15 days supply | Qty: 30 | Fill #0 | Status: CN

## 2021-05-07 ENCOUNTER — Other Ambulatory Visit (HOSPITAL_COMMUNITY): Payer: Self-pay

## 2021-05-07 DIAGNOSIS — J40 Bronchitis, not specified as acute or chronic: Secondary | ICD-10-CM | POA: Diagnosis not present

## 2021-05-07 DIAGNOSIS — J069 Acute upper respiratory infection, unspecified: Secondary | ICD-10-CM | POA: Diagnosis not present

## 2021-05-07 DIAGNOSIS — Z87892 Personal history of anaphylaxis: Secondary | ICD-10-CM | POA: Diagnosis not present

## 2021-05-07 DIAGNOSIS — R062 Wheezing: Secondary | ICD-10-CM | POA: Diagnosis not present

## 2021-05-07 MED ORDER — PREDNISONE 5 MG PO TABS
ORAL_TABLET | Freq: Every day | ORAL | 0 refills | Status: DC
  Filled 2021-05-07: qty 21, 6d supply, fill #0

## 2021-05-07 MED ORDER — EPINEPHRINE 0.3 MG/0.3ML IJ SOAJ
0.3000 mg | INTRAMUSCULAR | 1 refills | Status: DC | PRN
  Filled 2021-05-07: qty 2, 1d supply, fill #0
  Filled 2022-04-15: qty 2, 1d supply, fill #1

## 2021-05-07 MED ORDER — AZITHROMYCIN 250 MG PO TABS
ORAL_TABLET | Freq: Every day | ORAL | 0 refills | Status: DC
  Filled 2021-05-07: qty 6, 5d supply, fill #0

## 2021-05-07 MED ORDER — HYDROCODONE BIT-HOMATROP MBR 5-1.5 MG/5ML PO SOLN
5.0000 mL | Freq: Two times a day (BID) | ORAL | 0 refills | Status: DC
  Filled 2021-05-07: qty 140, 14d supply, fill #0

## 2021-05-30 ENCOUNTER — Other Ambulatory Visit: Payer: Self-pay

## 2021-05-30 ENCOUNTER — Ambulatory Visit: Payer: 59 | Admitting: Orthopedic Surgery

## 2021-05-30 ENCOUNTER — Ambulatory Visit: Payer: Self-pay

## 2021-05-30 DIAGNOSIS — M25521 Pain in right elbow: Secondary | ICD-10-CM | POA: Diagnosis not present

## 2021-06-01 ENCOUNTER — Encounter: Payer: Self-pay | Admitting: Orthopedic Surgery

## 2021-06-01 NOTE — Progress Notes (Signed)
Office Visit Note   Patient: Jane Gutierrez           Date of Birth: 16-Oct-1956           MRN: [id number] Visit Date: 05/30/2021 Requested by: Reynold Bowen, MD [address] [place],  [place] [address] PCP: Reynold Bowen, MD  Subjective: Chief Complaint  Patient presents with   Right Elbow - Pain    HPI: Jane Gutierrez is a 65 year old patient with right elbow forearm pain.  Constant ache.  Locked up about 2 weeks ago while she was doing Pilates.  Reports some occasional catching and locking which will wake her from sleep.  Denies any numbness and tingling or weakness.  Advil gives her some relief.  She also is concerned about possible early cyst developing on the dorsal aspect of the radiocarpal joint.  She is not lifting weights because of her elbow.              ROS: All systems reviewed are negative as they relate to the chief complaint within the history of present illness.  Patient denies  fevers or chills.   Assessment & Plan: Visit Diagnoses:  1. Pain in right elbow     Plan: Impression is right elbow pain with no real tenderness over the lateral epicondyle.  Is having some tenderness around the arcade of [place].  Has some arthritis on plain radiographs but no obvious loose body.  She is going to adjust her activity level.  No indication for injection at this time.  If her symptoms of locking and catching continue I think it could be worth getting further imaging on the elbow to look for degenerative loose bodies.  Alternatively there is a component potentially of posterior interosseous nerve compression with a dull aching in the forearm.  Nerve study could be considered for that as well.  Overall Jane Gutierrez wants to watch this for now.  If her symptoms worsen she should come back for further work-up.  Follow-up as needed.  Follow-Up Instructions: Return if symptoms worsen or fail to improve.   Orders:  Orders Placed This Encounter  Procedures   XR Elbow 2 Views Right   No orders of the  defined types were placed in this encounter.     Procedures: No procedures performed   Clinical Data: No additional findings.  Objective: Vital Signs: There were no vitals taken for this visit.  Physical Exam:   Constitutional: Patient appears well-developed HEENT:  Head: Normocephalic Eyes:EOM are normal Neck: Normal range of motion Cardiovascular: Normal rate Pulmonary/chest: Effort normal Neurologic: Patient is alert Skin: Skin is warm Psychiatric: Patient has normal mood and affect   Ortho Exam: Ortho exam demonstrates full active and passive range of motion of the left elbow.  Right elbow lacks about 5 degrees of full extension but has good flexion.  Pronation supination full.  No real pain radiating to the mobile wad with resisted elbow supination on the right or left.  No discrete tenderness over the medial lateral epicondyle.  No subluxation of the ulnar nerve.  No other masses lymphadenopathy or skin changes noted in the elbow region.  Radial pulse intact.  No pain with resisted wrist extension or middle finger extension on the right-hand side.  Does have some tenderness to palpation asymmetrically on the right with palpation over the arcade of frosche  Specialty Comments:  No specialty comments available.  Imaging: No results found.   PMFS History: Patient Active Problem List   Diagnosis Date Noted  Hardening of the aorta (main artery of the heart) ([place]) 03/02/2021   Genital herpes simplex 02/22/2021   Family history of diseases of the blood and blood-forming organs and certain disorders involving the immune mechanism 10/21/2019   Polyneuropathy 10/21/2019   Rash and other nonspecific skin eruption 10/21/2019   Raynaud's disease 01/12/2019   Osteoarthritis 11/12/2018   Acute bronchitis 10/27/2017   Cough 10/27/2017   Acute upper respiratory infection 09/02/2017   Pain in right hand 09/02/2017   Acute pharyngitis 08/18/2017   Chronic sinusitis 08/18/2017    Wheezing 08/18/2017   Psychophysiologic insomnia 01/06/2017   Pain in left elbow 01/02/2016   Dyspnea 09/11/2015   Abnormal weight loss 08/17/2015   Alopecia 08/17/2015   Fatigue 08/17/2015   Joint pain 08/17/2015   Breast cancer of upper-outer quadrant of left female breast ([place]) 06/28/2015   Nontoxic single thyroid nodule 12/28/2014   Right hip pain 09/19/2014   Right knee pain 09/19/2014   Dyspnea on exertion 08/08/2014   Heart palpitations 08/08/2014   Acquired absence of breast and absent nipple 03/09/2014   Myopathy 02/01/2014   Localized swelling, mass and lump, trunk 11/22/2013   Pre-operative cardiovascular examination 09/09/2013   Other chronic pulmonary heart diseases 05/27/2013   SIRS (systemic inflammatory response syndrome) ([place]) 04/14/2013   Anemia 04/14/2013   Dehydration 04/14/2013   Fever 04/14/2013   Allergy    IBS (irritable bowel syndrome)    Thyroid disease    ADD (attention deficit disorder)    Lyme disease    Asthma    Hypothyroidism    Contact lens/glasses fitting    Hyperlipidemia 11/16/2012   Headache 02/05/2012   Benign neoplasm of colon 09/17/2011   Migraine without aura, not refractory 09/17/2011   Malignant neoplasm of cervix uteri ([place]) 09/06/2009   Personal history of other specified conditions 09/06/2009   Thrombophlebitis of superficial veins of lower extremity 09/06/2009   Varicose veins of other specified sites 09/06/2009   Past Medical History:  Diagnosis Date   Abnormally small mouth    ADD (attention deficit disorder)    ADHD    Allergy    Anxiety    pt reported she doesn't have anxiety   Asthma    prn inhaler   Blood transfusion without reported diagnosis    Breast cancer ([place])    Dental crowns present    Difficulty swallowing pills    History of breast cancer    History of cervical cancer    History of palpitations    last echo 08/22/2014   Hypothyroidism    IBS (irritable bowel syndrome)     Family History   Problem Relation Age of Onset   Cancer Mother 22       uterine, mult myeloma   CVA Father    Cancer Sister 99       breast   Leukemia Sister        CLL   Cancer Maternal Grandmother 49       breast   Lupus Sister        twin sister   Sarcoidosis Sister        twin sister   Rheum arthritis Sister        twin sister   Celiac disease Daughter     Past Surgical History:  Procedure Laterality Date   ABDOMINAL HYSTERECTOMY     ABDOMINOPLASTY  03/28/2004   AXILLARY SENTINEL NODE BIOPSY  12/31/2012   Procedure: AXILLARY SENTINEL NODE BIOPSY;  Surgeon: Rolm Bookbinder,  MD;  Location: [facility];  Service: General;  Laterality: Bilateral;  bilateral sentinel node   BRAIN SURGERY     removal benign frontal lobe cyst in [place] Bilateral ~ 2008   BREAST IMPLANT EXCHANGE Bilateral 12/11/2017   Procedure: BILATERAL IMPLANT EXCHANGE;  Surgeon: Wallace Going, DO;  Location: [facility];  Service: Plastics;  Laterality: Bilateral;   BREAST RECONSTRUCTION WITH PLACEMENT OF TISSUE EXPANDER AND FLEX HD (ACELLULAR HYDRATED DERMIS)  12/31/2012   Procedure: BREAST RECONSTRUCTION WITH PLACEMENT OF TISSUE EXPANDER AND FLEX HD (ACELLULAR HYDRATED DERMIS);  Surgeon: Theodoro Kos, DO;  Location: [facility];  Service: Plastics;  Laterality: Bilateral;  bilateral immediate breast reconstruction with expanders and flex hd    CAPSULOTOMY Left 09/01/2014   Procedure: CAPSULOTOMY OF LEFT BREAST WITH LIPOFILLING FOR SYMMETRY;  Surgeon: Theodoro Kos, DO;  Location: [facility];  Service: Plastics;  Laterality: Left;   CERVICAL CERCLAGE     DIAGNOSTIC LAPAROSCOPY  05/12/2003   right partial salpingectomy; lysis paraovarian adhesions left   DILATION AND CURETTAGE OF UTERUS  2005   ENDOMETRIAL FULGURATION  05/12/2003   FACIAL COSMETIC SURGERY  ~ 2010   FOOT SURGERY Bilateral 07/2012   GYNECOLOGIC CRYOSURGERY  1998   INCISION AND  DRAINAGE OF WOUND Left 04/28/2013   Procedure: IRRIGATION AND DEBRIDEMENT LEFT BREAST WOUND, POSSIBLE CLOSURE OF WOUND WITH DRAIN PLACEMENT;  Surgeon: Theodoro Kos, DO;  Location: [facility];  Service: Plastics;  Laterality: Left;   LATISSIMUS FLAP TO BREAST Left 09/29/2013   Procedure: LATISSIMUS FLAP TO BREAST WITH TISSUE EXPANDER PLACEMENT FOR LEFT BREAST RECONSTRUCTION;  Surgeon: Theodoro Kos, DO;  Location: [facility];  Service: Plastics;  Laterality: Left;   LIPECTOMY Bilateral 03/28/2004   hip   LIPOSUCTION Bilateral 03/09/2014   Procedure: LIPOSUCTION ABDOMEN W/ LIPOFILLING LATERAL BILATERAL BREAST;  Surgeon: Theodoro Kos, DO;  Location: [facility];  Service: Plastics;  Laterality: Bilateral;  LIPOSUCTION NECK   LIPOSUCTION WITH LIPOFILLING Bilateral 09/01/2014   Procedure: LIPOSUCTION WITH LIPOFILLING;  Surgeon: Theodoro Kos, DO;  Location: [facility];  Service: Plastics;  Laterality: Bilateral;   LYSIS OF ADHESION  03/28/2004   pelvic   MASTECTOMY     PORT-A-CATH REMOVAL Right 03/09/2014   Procedure: REMOVAL PORT-A-CATH;  Surgeon: Theodoro Kos, DO;  Location: [facility];  Service: Plastics;  Laterality: Right;   PORTACATH PLACEMENT  01/27/2013   Procedure: INSERTION PORT-A-CATH;  Surgeon: Rolm Bookbinder, MD;  Location: [facility];  Service: General;  Laterality: Right;   RADIOFREQUENCY ABLATION NERVES  05/12/2003   uterosacral nerve ablation   REMOVAL OF BILATERAL TISSUE EXPANDERS WITH PLACEMENT OF BILATERAL BREAST IMPLANTS Bilateral 03/09/2014   Procedure: REMOVAL OF BILATERAL TISSUE EXPANDERS WITH PLACEMENT OF BILATERAL BREAST IMPLANTS;  Surgeon: Theodoro Kos, DO;  Location: [facility];  Service: Plastics;  Laterality: Bilateral;   TEE WITHOUT CARDIOVERSION N/A 06/04/2013   Procedure: TRANSESOPHAGEAL ECHOCARDIOGRAM (TEE);  Surgeon: Jolaine Artist, MD;  [facility];  Service: Cardiovascular;   Laterality: N/A;   TISSUE EXPANDER PLACEMENT Left 06/16/2013   Procedure: LEFT BREAST TISSUE EXPANDER REMOVAL;  Surgeon: Theodoro Kos, DO;  Location: [facility];  Service: Plastics;  Laterality: Left;   TISSUE EXPANDER PLACEMENT Left 09/29/2013   Procedure: TISSUE EXPANDER;  Surgeon: Theodoro Kos, DO;  Location: [facility];  Service: Plastics;  Laterality: Left;   TOTAL ABDOMINAL HYSTERECTOMY W/ BILATERAL  SALPINGOOPHORECTOMY  03/28/2004   TOTAL MASTECTOMY  12/31/2012   Procedure: TOTAL MASTECTOMY;  Surgeon: Rolm Bookbinder, MD;  Location: [facility];  Service: General;  Laterality: Bilateral;  bilateral total mastectomies   Social History   Occupational History   Not on file  Tobacco Use   Smoking status: Never   Smokeless tobacco: Never  Substance and Sexual Activity   Alcohol use: No    Alcohol/week: 0.0 standard drinks    Comment: occasionally   Drug use: No   Sexual activity: Yes

## 2021-06-07 ENCOUNTER — Other Ambulatory Visit (HOSPITAL_COMMUNITY): Payer: Self-pay

## 2021-06-07 MED ORDER — AZITHROMYCIN 250 MG PO TABS
ORAL_TABLET | ORAL | 0 refills | Status: DC
  Filled 2021-06-07: qty 6, 5d supply, fill #0

## 2021-06-11 ENCOUNTER — Other Ambulatory Visit (HOSPITAL_COMMUNITY): Payer: Self-pay

## 2021-06-11 MED ORDER — CIPROFLOXACIN HCL 500 MG PO TABS
500.0000 mg | ORAL_TABLET | Freq: Two times a day (BID) | ORAL | 0 refills | Status: DC
  Filled 2021-06-11: qty 14, 7d supply, fill #0

## 2021-06-11 MED ORDER — ALBUTEROL SULFATE HFA 108 (90 BASE) MCG/ACT IN AERS
2.0000 | INHALATION_SPRAY | Freq: Four times a day (QID) | RESPIRATORY_TRACT | 4 refills | Status: DC | PRN
  Filled 2021-06-11: qty 18, 25d supply, fill #0

## 2021-06-26 ENCOUNTER — Other Ambulatory Visit (HOSPITAL_COMMUNITY): Payer: Self-pay

## 2021-06-26 MED FILL — Eszopiclone Tab 3 MG: ORAL | 30 days supply | Qty: 30 | Fill #1 | Status: AC

## 2021-07-19 ENCOUNTER — Ambulatory Visit (INDEPENDENT_AMBULATORY_CARE_PROVIDER_SITE_OTHER): Payer: Self-pay | Admitting: Plastic Surgery

## 2021-07-19 ENCOUNTER — Other Ambulatory Visit: Payer: Self-pay

## 2021-07-19 DIAGNOSIS — Z411 Encounter for cosmetic surgery: Secondary | ICD-10-CM

## 2021-07-19 NOTE — Progress Notes (Signed)
Patient presents to discuss neuromodulator treatment.  She is bothered by static and dynamic lines in the forehead, glabella and crows feet.  Last time we did 36 units of Botox and she was happy with that.  She also wants me to look at her lower face to see if anything can be done about the lines in that area in the jowl and prejowl area.  The forehead, glabella and crows feet were prepped with an alcohol pad and 36 units of Botox were distributed in those areas as was done previously.  She tolerated this fine.  We will see her again for touchup if needed.  Regarding the lines in her lower face I was hesitant to put more filler in those areas to avoid adding too much volume to the lower face.  We did discuss laser resurfacing.  We went over a few options but I did bring up the halo laser for resurfacing which she thinks she might be able to do in the wintertime to accommodate a little bit of downtime.  She is going to research this and get back to [place] on it.

## 2021-07-24 ENCOUNTER — Other Ambulatory Visit (HOSPITAL_COMMUNITY): Payer: Self-pay

## 2021-07-24 MED FILL — Levothyroxine Sodium Tab 100 MCG: ORAL | 90 days supply | Qty: 90 | Fill #1 | Status: AC

## 2021-07-25 ENCOUNTER — Other Ambulatory Visit (HOSPITAL_COMMUNITY): Payer: Self-pay

## 2021-07-25 DIAGNOSIS — Z01419 Encounter for gynecological examination (general) (routine) without abnormal findings: Secondary | ICD-10-CM | POA: Diagnosis not present

## 2021-07-25 DIAGNOSIS — Z6826 Body mass index (BMI) 26.0-26.9, adult: Secondary | ICD-10-CM | POA: Diagnosis not present

## 2021-07-25 MED ORDER — FLUCONAZOLE 150 MG PO TABS
ORAL_TABLET | ORAL | 4 refills | Status: DC
  Filled 2021-07-25 – 2021-09-10 (×2): qty 3, 6d supply, fill #0
  Filled 2021-11-08: qty 3, 6d supply, fill #1
  Filled 2022-04-15: qty 3, 6d supply, fill #2
  Filled 2022-06-03: qty 3, 6d supply, fill #3

## 2021-07-25 MED ORDER — NITROFURANTOIN MONOHYD MACRO 100 MG PO CAPS
100.0000 mg | ORAL_CAPSULE | Freq: Two times a day (BID) | ORAL | 3 refills | Status: DC
  Filled 2021-07-25 – 2021-09-10 (×2): qty 14, 7d supply, fill #0
  Filled 2021-11-08: qty 14, 7d supply, fill #1

## 2021-07-25 MED ORDER — PHENAZOPYRIDINE HCL 100 MG PO TABS
100.0000 mg | ORAL_TABLET | Freq: Two times a day (BID) | ORAL | 2 refills | Status: DC | PRN
  Filled 2021-07-25 – 2021-09-10 (×2): qty 10, 5d supply, fill #0
  Filled 2021-11-08: qty 10, 5d supply, fill #1

## 2021-08-20 ENCOUNTER — Other Ambulatory Visit: Payer: Self-pay | Admitting: *Deleted

## 2021-08-20 ENCOUNTER — Other Ambulatory Visit: Payer: Self-pay | Admitting: Adult Health

## 2021-08-20 DIAGNOSIS — C50412 Malignant neoplasm of upper-outer quadrant of left female breast: Secondary | ICD-10-CM

## 2021-08-20 DIAGNOSIS — Z170 Estrogen receptor positive status [ER+]: Secondary | ICD-10-CM

## 2021-08-20 MED ORDER — ESZOPICLONE 3 MG PO TABS: ORAL_TABLET | ORAL | 2 refills | Status: DC

## 2021-08-21 ENCOUNTER — Other Ambulatory Visit (HOSPITAL_COMMUNITY): Payer: Self-pay

## 2021-08-21 MED ORDER — ESZOPICLONE 3 MG PO TABS
3.0000 mg | ORAL_TABLET | Freq: Every evening | ORAL | 2 refills | Status: DC
  Filled 2021-08-21: qty 30, 30d supply, fill #0
  Filled 2021-09-10: qty 30, 30d supply, fill #1
  Filled 2021-11-08: qty 30, 30d supply, fill #2

## 2021-08-28 ENCOUNTER — Telehealth: Payer: 59 | Admitting: Physician Assistant

## 2021-08-28 ENCOUNTER — Other Ambulatory Visit (HOSPITAL_COMMUNITY): Payer: Self-pay

## 2021-08-28 DIAGNOSIS — H1031 Unspecified acute conjunctivitis, right eye: Secondary | ICD-10-CM | POA: Diagnosis not present

## 2021-08-28 MED ORDER — OFLOXACIN 0.3 % OP SOLN
1.0000 [drp] | Freq: Four times a day (QID) | OPHTHALMIC | 0 refills | Status: AC
  Filled 2021-08-28: qty 5, 25d supply, fill #0

## 2021-08-28 NOTE — Progress Notes (Signed)

## 2021-08-28 NOTE — Progress Notes (Signed)
I have spent 5 minutes in review of e-visit questionnaire, review and updating patient chart, medical decision making and response to patient.   Madi Bonfiglio Cody Shanterria Franta, PA-C    

## 2021-09-04 DIAGNOSIS — D225 Melanocytic nevi of trunk: Secondary | ICD-10-CM | POA: Diagnosis not present

## 2021-09-04 DIAGNOSIS — Z08 Encounter for follow-up examination after completed treatment for malignant neoplasm: Secondary | ICD-10-CM | POA: Diagnosis not present

## 2021-09-04 DIAGNOSIS — Z85828 Personal history of other malignant neoplasm of skin: Secondary | ICD-10-CM | POA: Diagnosis not present

## 2021-09-04 DIAGNOSIS — L821 Other seborrheic keratosis: Secondary | ICD-10-CM | POA: Diagnosis not present

## 2021-09-04 DIAGNOSIS — L814 Other melanin hyperpigmentation: Secondary | ICD-10-CM | POA: Diagnosis not present

## 2021-09-04 DIAGNOSIS — L570 Actinic keratosis: Secondary | ICD-10-CM | POA: Diagnosis not present

## 2021-09-06 DIAGNOSIS — H5201 Hypermetropia, right eye: Secondary | ICD-10-CM | POA: Diagnosis not present

## 2021-09-10 ENCOUNTER — Other Ambulatory Visit (HOSPITAL_COMMUNITY): Payer: Self-pay

## 2021-09-12 ENCOUNTER — Other Ambulatory Visit (HOSPITAL_COMMUNITY): Payer: Self-pay

## 2021-09-13 ENCOUNTER — Other Ambulatory Visit (HOSPITAL_COMMUNITY): Payer: Self-pay

## 2021-09-18 ENCOUNTER — Other Ambulatory Visit (HOSPITAL_COMMUNITY): Payer: Self-pay

## 2021-09-19 DIAGNOSIS — I7300 Raynaud's syndrome without gangrene: Secondary | ICD-10-CM | POA: Diagnosis not present

## 2021-09-19 DIAGNOSIS — E039 Hypothyroidism, unspecified: Secondary | ICD-10-CM | POA: Diagnosis not present

## 2021-09-25 ENCOUNTER — Other Ambulatory Visit (HOSPITAL_COMMUNITY): Payer: Self-pay

## 2021-09-25 MED ORDER — DEXTROAMPHETAMINE SULFATE 10 MG PO TABS
20.0000 mg | ORAL_TABLET | Freq: Two times a day (BID) | ORAL | 0 refills | Status: DC
  Filled 2021-09-25: qty 360, 90d supply, fill #0

## 2021-09-27 ENCOUNTER — Encounter: Payer: Self-pay | Admitting: Plastic Surgery

## 2021-09-27 ENCOUNTER — Other Ambulatory Visit (HOSPITAL_COMMUNITY): Payer: Self-pay

## 2021-09-27 MED ORDER — LIDOCAINE 23% - TETRACAINE 7% TOPICAL OINTMENT (PLASTICIZED)
1.0000 "application " | TOPICAL_OINTMENT | Freq: Once | CUTANEOUS | 0 refills | Status: AC
  Filled 2021-09-27: qty 60, 5d supply, fill #0

## 2021-09-28 ENCOUNTER — Other Ambulatory Visit (HOSPITAL_COMMUNITY): Payer: Self-pay

## 2021-10-01 ENCOUNTER — Other Ambulatory Visit (HOSPITAL_COMMUNITY): Payer: Self-pay

## 2021-10-01 MED FILL — Valacyclovir HCl Tab 1 GM: ORAL | 90 days supply | Qty: 90 | Fill #0 | Status: AC

## 2021-10-01 MED FILL — Levothyroxine Sodium Tab 100 MCG: ORAL | 90 days supply | Qty: 90 | Fill #2 | Status: AC

## 2021-10-08 ENCOUNTER — Ambulatory Visit: Payer: 59

## 2021-10-15 ENCOUNTER — Other Ambulatory Visit (HOSPITAL_COMMUNITY): Payer: Self-pay

## 2021-10-15 ENCOUNTER — Telehealth: Payer: Self-pay | Admitting: Plastic Surgery

## 2021-10-15 NOTE — Telephone Encounter (Signed)
Following Patients phone call 10/24, Dr. Marla Roe will call patient and follow-up with instructions and details regarding appointment.

## 2021-10-15 NOTE — Telephone Encounter (Signed)
No Follow Up needed.

## 2021-10-16 ENCOUNTER — Other Ambulatory Visit: Payer: Self-pay

## 2021-10-16 ENCOUNTER — Other Ambulatory Visit: Payer: 59 | Admitting: Plastic Surgery

## 2021-10-16 ENCOUNTER — Ambulatory Visit (INDEPENDENT_AMBULATORY_CARE_PROVIDER_SITE_OTHER): Payer: Self-pay | Admitting: Plastic Surgery

## 2021-10-16 ENCOUNTER — Encounter: Payer: Self-pay | Admitting: Plastic Surgery

## 2021-10-16 DIAGNOSIS — Z719 Counseling, unspecified: Secondary | ICD-10-CM

## 2021-10-16 NOTE — Progress Notes (Signed)
HALO Treatment   Treatment  Settings      Zone  Area (cm2) [employer] (Joules) Delivered Energy (J) 1470 Depth (Micron) 1470 Density (%) 2940 Depth (M) 2940 Density (%)  1 90 1014 1036 425 30.6 70 19.4  2 90 1014 1029 425 30.6 70 19.3  3 62 699 713 425 30.5 70 1.94  4 62 699 711 425 32.4 70 19.3  5 24  277 299 425 40.6 70 20.5   perioral 52 804 815 450 40.6 70 23.3  R neck 111 1061 1068 400 25.2 70 18.1  L Neck 111 1061 1074 400 25.3 70 18.2    Post care information given

## 2021-11-02 ENCOUNTER — Other Ambulatory Visit (HOSPITAL_BASED_OUTPATIENT_CLINIC_OR_DEPARTMENT_OTHER): Payer: Self-pay

## 2021-11-02 ENCOUNTER — Other Ambulatory Visit: Payer: Self-pay

## 2021-11-02 ENCOUNTER — Ambulatory Visit: Payer: 59 | Attending: Internal Medicine

## 2021-11-02 DIAGNOSIS — Z23 Encounter for immunization: Secondary | ICD-10-CM

## 2021-11-02 MED ORDER — PFIZER COVID-19 VAC BIVALENT 30 MCG/0.3ML IM SUSP
INTRAMUSCULAR | 0 refills | Status: AC
  Filled 2021-11-02: qty 0.3, 1d supply, fill #0

## 2021-11-02 NOTE — Progress Notes (Signed)
   Covid-19 Vaccination Clinic  Name:  Jane Gutierrez    MRN: [id number] DOB: 03-18-1956  11/02/2021  Jane Gutierrez was observed post Covid-19 immunization for 15 minutes without incident. She was provided with Vaccine Information Sheet and instruction to access the V-Safe system.   Jane Gutierrez was instructed to call 911 with any severe reactions post vaccine: Difficulty breathing  Swelling of face and throat  A fast heartbeat  A bad rash all over body  Dizziness and weakness   Immunizations Administered     Name Date Dose VIS Date Route   Pfizer Covid-19 Vaccine Bivalent Booster 11/02/2021  1:00 PM 0.3 mL 08/22/2021 Intramuscular   Manufacturer: [place]   Lot: [id number]   [place]: [phone]

## 2021-11-08 MED FILL — Rifaximin Tab 550 MG: ORAL | 14 days supply | Qty: 42 | Fill #1 | Status: CN

## 2021-11-09 ENCOUNTER — Other Ambulatory Visit (HOSPITAL_COMMUNITY): Payer: Self-pay

## 2021-11-09 MED FILL — Albuterol Sulfate Inhal Aero 108 MCG/ACT (90MCG Base Equiv): RESPIRATORY_TRACT | 25 days supply | Qty: 18 | Fill #0 | Status: AC

## 2021-12-04 ENCOUNTER — Other Ambulatory Visit (HOSPITAL_COMMUNITY): Payer: Self-pay

## 2021-12-04 MED ORDER — CARESTART COVID-19 HOME TEST VI KIT
PACK | 0 refills | Status: DC
  Filled 2021-12-04: qty 4, 4d supply, fill #0

## 2021-12-07 DIAGNOSIS — J029 Acute pharyngitis, unspecified: Secondary | ICD-10-CM | POA: Diagnosis not present

## 2021-12-07 DIAGNOSIS — R0981 Nasal congestion: Secondary | ICD-10-CM | POA: Diagnosis not present

## 2021-12-07 DIAGNOSIS — G479 Sleep disorder, unspecified: Secondary | ICD-10-CM | POA: Diagnosis not present

## 2021-12-07 DIAGNOSIS — R051 Acute cough: Secondary | ICD-10-CM | POA: Diagnosis not present

## 2021-12-07 DIAGNOSIS — J4531 Mild persistent asthma with (acute) exacerbation: Secondary | ICD-10-CM | POA: Diagnosis not present

## 2021-12-07 DIAGNOSIS — Z1152 Encounter for screening for COVID-19: Secondary | ICD-10-CM | POA: Diagnosis not present

## 2021-12-10 ENCOUNTER — Other Ambulatory Visit (HOSPITAL_COMMUNITY): Payer: Self-pay

## 2021-12-10 MED ORDER — PREDNISONE 5 MG (21) PO TBPK
ORAL_TABLET | ORAL | 0 refills | Status: DC
  Filled 2021-12-10: qty 21, 6d supply, fill #0

## 2021-12-21 ENCOUNTER — Other Ambulatory Visit (HOSPITAL_COMMUNITY): Payer: Self-pay

## 2022-01-10 ENCOUNTER — Other Ambulatory Visit: Payer: Self-pay

## 2022-01-10 ENCOUNTER — Other Ambulatory Visit (HOSPITAL_COMMUNITY): Payer: Self-pay

## 2022-01-11 ENCOUNTER — Encounter: Payer: 59 | Admitting: Adult Health

## 2022-01-11 ENCOUNTER — Other Ambulatory Visit (HOSPITAL_COMMUNITY): Payer: Self-pay

## 2022-01-14 ENCOUNTER — Other Ambulatory Visit (HOSPITAL_COMMUNITY): Payer: Self-pay

## 2022-01-14 MED ORDER — SYNTHROID 100 MCG PO TABS
100.0000 ug | ORAL_TABLET | Freq: Every morning | ORAL | 3 refills | Status: DC
  Filled 2022-01-14: qty 90, 90d supply, fill #0
  Filled 2022-04-15: qty 90, 90d supply, fill #1
  Filled 2022-07-03: qty 90, 90d supply, fill #2
  Filled 2022-09-30: qty 90, 90d supply, fill #3

## 2022-01-14 NOTE — Progress Notes (Signed)
CLINIC:  Survivorship   REASON FOR VISIT:  Routine follow-up for history of breast cancer.   BRIEF ONCOLOGIC HISTORY:  Oncology History  Breast cancer, left breast ([place]) (Resolved)   Initial Diagnosis   Breast cancer, left breast   12/04/2012 Initial Biopsy   Left breast needle core biopsy (UOQ): Grade 3, DCIS with necrosis and calcs. ER+ (100%), PR+ (86%).    Breast cancer of upper-outer quadrant of left female breast ([place])  12/04/2012 Initial Biopsy   LEFT breast needle core biopsy (UOQ): Grade 3, DCIS with necrosis and calcs. ER+ (100%), PR+ (86%).    12/14/2012 Breast MRI   LEFT breast: Clumped nodular enhancement measuring 7.2 x 3.2 x 2.6 cm. Also associated post-biopsy change. RIGHT breast: At 12 o'clock location, there is 52mm slightly irregular mass-like area of abnml enhancement. No lymphadenopathy.    12/25/2012 Initial Biopsy   RIGHT breast needle core biopsy (upper centeral): Grade 1-2, IDC, DCIS with calcs. ER+ (100%), PR+ (100%), HER2- (ratio 1.23). Ki67 4%.    12/25/2012 Initial Diagnosis   Bilateral breast cancer   12/31/2012 Surgery   Bilateral mastectomies with bilat SLNB [facility]). LEFT: Grade 2, IDC, 2.3 cm. (+) LVI. Grade 3 DCIS w/comedonecrosis & calcs. (-) margins. 1 left ax SLN neg.  ER+ (99%), PR+ (10%), HER2 repeated and (+) ratio (6.81). Ki67 46%. RIGHT: Benign, 1 SLN neg.   12/31/2012 Pathologic Stage   LEFT breast: pT2, pN0, pMx. Stage IIA.    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 EF 55-60%   02/04/2013 - 05/13/2013 Adjuvant Chemotherapy   Taxotere/Carbo/Herceptin q 3 wks x 5 cycles completed. (planned for 6 cycles but d/c'd early due to pt wishes and recent infection).    05/2013 -  Anti-estrogen oral therapy   Attempted to take Letrozole, Aromasin, & Tamoxifen but patient could not tolerate any anti-estrogen therapy due to severe depressive symptoms and  joint/muscle aches & pains.  No longer taking anti-estrogen therapy.   09/29/2013 Surgery   LEFT breast reconstruction with lat flap and placement of tissue expander ([place]).    11/26/2013 Imaging   CT chest: No definite CT findings for chest wall recurrence and no suspicious supraclavicular or axillary adenopathy.  Negative for metastatic disease in lungs or bones.     - 02/16/2014 Adjuvant Chemotherapy   Herceptin maintenance x 1 year completed.    03/09/2014 Surgery   Removal of bilateral tissue expanders and placement of bilateral breast implants ([place]).    03/09/2014 Procedure   Right scar biopsy: No malignancy & Left breast capsule biopsy: No evidence of malignancy.    08/22/2014 Echocardiogram   Post-treatment. EF 55-60%   06/30/2015 Survivorship   Survivorship Care Plan given to patient and reviewed with her during in-person visit.       INTERVAL HISTORY:  Jane Gutierrez presents to the Survivorship Clinic today for routine follow-up for her history of breast cancer.    Jane Gutierrez continues to do well.  She let me know that she is in a chek2 study and the researchers have noted that this study indicates an increased # of patients who are chek2 positive with malignant hematology disorders.  She has noted some changes in her RDW and wanted to know if she should be concerned.    She also wants to see cardiology due to the cardiac changes she experienced during herceptin administration in 2014 with a brief temporary decline in her EF and her  increasing age.    She is weaning off of lunesta at night and would like her dose adjusted from $RemoveBefor'3mg'JUwKgUyuzcaa$  to $R'1mg'Uw$ .     REVIEW OF SYSTEMS:  Review of Systems  Constitutional:  Negative for appetite change, chills, fatigue, fever and unexpected weight change.  HENT:   Negative for hearing loss, lump/mass, nosebleeds, sore throat, tinnitus and trouble swallowing.   Eyes:  Negative for eye problems and icterus.  Respiratory:  Negative for chest tightness,  cough and shortness of breath.   Cardiovascular:  Negative for chest pain, leg swelling and palpitations.  Gastrointestinal:  Negative for abdominal distention, abdominal pain, blood in stool, constipation, diarrhea, nausea and vomiting.       Has h/o IBS   Endocrine: Negative for hot flashes.  Genitourinary:  Negative for difficulty urinating.   Musculoskeletal:  Negative for arthralgias.  Skin:  Negative for itching and rash.  Neurological:  Positive for numbness. Negative for dizziness, extremity weakness and headaches.  Hematological:  Negative for adenopathy. Does not bruise/bleed easily.  Psychiatric/Behavioral:  Positive for sleep disturbance (controlled with Lunesta at night time). Negative for depression. The patient is not nervous/anxious.  Breast: Denies any new nodularity, masses, tenderness, nipple changes, or nipple discharge.     PAST MEDICAL/SURGICAL HISTORY:  Past Medical History:  Diagnosis Date   Abnormally small mouth    ADD (attention deficit disorder)    ADHD    Allergy    Anxiety    pt reported she doesn't have anxiety   Asthma    prn inhaler   Blood transfusion without reported diagnosis    Breast cancer ([place])    Dental crowns present    Difficulty swallowing pills    History of breast cancer    History of cervical cancer    History of palpitations    last echo 08/22/2014   Hypothyroidism    IBS (irritable bowel syndrome)    Past Surgical History:  Procedure Laterality Date   ABDOMINAL HYSTERECTOMY     ABDOMINOPLASTY  03/28/2004   AXILLARY SENTINEL NODE BIOPSY  12/31/2012   Procedure: AXILLARY SENTINEL NODE BIOPSY;  Surgeon: Rolm Bookbinder, MD;  Location: [place];  Service: General;  Laterality: Bilateral;  bilateral sentinel node   BRAIN SURGERY     removal benign frontal lobe cyst in [place] Bilateral ~ 2008   BREAST IMPLANT EXCHANGE Bilateral 12/11/2017   Procedure: BILATERAL IMPLANT EXCHANGE;   Surgeon: Wallace Going, DO;  Location: [place];  Service: Plastics;  Laterality: Bilateral;   BREAST RECONSTRUCTION WITH PLACEMENT OF TISSUE EXPANDER AND FLEX HD (ACELLULAR HYDRATED DERMIS)  12/31/2012   Procedure: BREAST RECONSTRUCTION WITH PLACEMENT OF TISSUE EXPANDER AND FLEX HD (ACELLULAR HYDRATED DERMIS);  Surgeon: Theodoro Kos, DO;  Location: [place];  Service: Plastics;  Laterality: Bilateral;  bilateral immediate breast reconstruction with expanders and flex hd    CAPSULOTOMY Left 09/01/2014   Procedure: CAPSULOTOMY OF LEFT BREAST WITH LIPOFILLING FOR SYMMETRY;  Surgeon: Theodoro Kos, DO;  Location: [facility];  Service: Plastics;  Laterality: Left;   CERVICAL CERCLAGE     DIAGNOSTIC LAPAROSCOPY  05/12/2003   right partial salpingectomy; lysis paraovarian adhesions left   DILATION AND CURETTAGE OF UTERUS  2005   ENDOMETRIAL FULGURATION  05/12/2003   FACIAL COSMETIC SURGERY  ~ 2010   FOOT SURGERY Bilateral 07/2012   GYNECOLOGIC CRYOSURGERY  1998   INCISION AND DRAINAGE OF WOUND Left 04/28/2013  Procedure: IRRIGATION AND DEBRIDEMENT LEFT BREAST WOUND, POSSIBLE CLOSURE OF WOUND WITH DRAIN PLACEMENT;  Surgeon: Theodoro Kos, DO;  Location: [place];  Service: Plastics;  Laterality: Left;   LATISSIMUS FLAP TO BREAST Left 09/29/2013   Procedure: LATISSIMUS FLAP TO BREAST WITH TISSUE EXPANDER PLACEMENT FOR LEFT BREAST RECONSTRUCTION;  Surgeon: Theodoro Kos, DO;  Location: [place];  Service: Plastics;  Laterality: Left;   LIPECTOMY Bilateral 03/28/2004   hip   LIPOSUCTION Bilateral 03/09/2014   Procedure: LIPOSUCTION ABDOMEN W/ LIPOFILLING LATERAL BILATERAL BREAST;  Surgeon: Theodoro Kos, DO;  Location: [facility];  Service: Plastics;  Laterality: Bilateral;  LIPOSUCTION NECK   LIPOSUCTION WITH LIPOFILLING Bilateral 09/01/2014   Procedure: LIPOSUCTION WITH LIPOFILLING;  Surgeon: Theodoro Kos, DO;  Location: [place];  Service: Plastics;  Laterality: Bilateral;   LYSIS OF ADHESION  03/28/2004   pelvic   MASTECTOMY     PORT-A-CATH REMOVAL Right 03/09/2014   Procedure: REMOVAL PORT-A-CATH;  Surgeon: Theodoro Kos, DO;  Location: [place];  Service: Plastics;  Laterality: Right;   PORTACATH PLACEMENT  01/27/2013   Procedure: INSERTION PORT-A-CATH;  Surgeon: Rolm Bookbinder, MD;  Location: [facility];  Service: General;  Laterality: Right;   RADIOFREQUENCY ABLATION NERVES  05/12/2003   uterosacral nerve ablation   REMOVAL OF BILATERAL TISSUE EXPANDERS WITH PLACEMENT OF BILATERAL BREAST IMPLANTS Bilateral 03/09/2014   Procedure: REMOVAL OF BILATERAL TISSUE EXPANDERS WITH PLACEMENT OF BILATERAL BREAST IMPLANTS;  Surgeon: Theodoro Kos, DO;  Location: [place];  Service: Plastics;  Laterality: Bilateral;   TEE WITHOUT CARDIOVERSION N/A 06/04/2013   Procedure: TRANSESOPHAGEAL ECHOCARDIOGRAM (TEE);  Surgeon: Jolaine Artist, MD;  Location: [facility] END[facility];  Service: Cardiovascular;  Laterality: N/A;   TISSUE EXPANDER PLACEMENT Left 06/16/2013   Procedure: LEFT BREAST TISSUE EXPANDER REMOVAL;  Surgeon: Theodoro Kos, DO;  Location: [place];  Service: Plastics;  Laterality: Left;   TISSUE EXPANDER PLACEMENT Left 09/29/2013   Procedure: TISSUE EXPANDER;  Surgeon: Theodoro Kos, DO;  Location: [place];  Service: Plastics;  Laterality: Left;   TOTAL ABDOMINAL HYSTERECTOMY W/ BILATERAL SALPINGOOPHORECTOMY  03/28/2004   TOTAL MASTECTOMY  12/31/2012   Procedure: TOTAL MASTECTOMY;  Surgeon: Rolm Bookbinder, MD;  Location: [place];  Service: General;  Laterality: Bilateral;  bilateral total mastectomies     ALLERGIES:  Allergies  Allergen Reactions   Betadine [Povidone Iodine] Anaphylaxis   Doxycycline Rash   Iodine Anaphylaxis   Shellfish-Derived Products Anaphylaxis   Amoxicillin Rash   Cleocin [Clindamycin Hcl] Rash    Ginger Rash   Penicillins Rash   Sulfamethoxazole-Trimethoprim Rash     CURRENT MEDICATIONS:  Outpatient Encounter Medications as of 01/15/2022  Medication Sig Note   eszopiclone (LUNESTA) 1 MG TABS tablet Take 1 tablet (1 mg total) by mouth at bedtime as needed for sleep. Take immediately before bedtime    albuterol (VENTOLIN HFA) 108 (90 Base) MCG/ACT inhaler INHALE 2 PUFFS BY MOUTH EVERY 6 HOURS AS NEEDED    B-Complex TABS Take 1 tab po qd    Biotin 1 MG CAPS Take 1 capsule by mouth daily.    COVID-19 At Home Antigen Test [facility] COVID-19 HOME TEST) KIT Use as directed    COVID-19 mRNA bivalent vaccine, Pfizer, (PFIZER COVID-19 VAC BIVALENT) injection Inject into the muscle.    dextroamphetamine (DEXTROSTAT) 10 MG tablet Take 2 tablets (20 mg total) by mouth 2 (two) times daily.    EPINEPHrine 0.3 mg/0.3  mL IJ SOAJ injection Inject 0.3 mg (1 pen) into the muscle as needed.    fluconazole (DIFLUCAN) 150 MG tablet Take 1 tablet by mouth now and repeat in 3 & 6 days    fluocinonide gel (LIDEX) 0.05 % Apply topically.    hyoscyamine (ANASPAZ) 0.125 MG TBDP disintergrating tablet Place 0.125 mg under the tongue as needed.     loratadine (CLARITIN) 10 MG tablet Take 1 tab po QD    Multiple Vitamin (MULTIVITAMIN WITH MINERALS) TABS Take 1 tablet by mouth daily.    nitrofurantoin, macrocrystal-monohydrate, (MACROBID) 100 MG capsule TAKE 1 CAPSULE BY MOUTH EVERY 12 HOURS 07/19/2021: Pt reports PRN   nitrofurantoin, macrocrystal-monohydrate, (MACROBID) 100 MG capsule Take 1 capsule (100 mg total) by mouth 2 (two) times daily.    phenazopyridine (PYRIDIUM) 100 MG tablet Take 1 tablet (100 mg total) by mouth 2 (two) times daily as needed.    phenazopyridine (PYRIDIUM) 200 MG tablet TAKE 1 TABLET BY MOUTH 3 TIMES DAILY 07/19/2021: Pt reports PRN   predniSONE (STERAPRED UNI-PAK 21 TAB) 5 MG (21) TBPK tablet Take as directed within package. Take with food    rifaximin (XIFAXAN) 550 MG TABS tablet TAKE  1 TABLET BY MOUTH THREE TIMES DAILY FOR 14 DAYS.    SHINGRIX injection     SYNTHROID 100 MCG tablet Take 1 tablet (100 mcg total) by mouth every morning.    valACYclovir (VALTREX) 1000 MG tablet Take 1 tablet (1,000 mg total) by mouth daily.    vitamin E 600 UNIT capsule Take 800 Units by mouth daily.  04/06/2018: Reports she only takes $RemoveBe'400mg'[id number]$  bid    [DISCONTINUED] Eszopiclone 3 MG TABS TAKE 1 TABLET BY MOUTH IMMEDIATELY BEFORE BEDTIME    [DISCONTINUED] Eszopiclone 3 MG TABS Take 1 tablet (3 mg total) by mouth nightly    [DISCONTINUED] SYNTHROID 100 MCG tablet TAKE 1 TABLET BY MOUTH EVERY MORNING    No facility-administered encounter medications on file as of 01/15/2022.     ONCOLOGIC FAMILY HISTORY:  Family History  Problem Relation Age of Onset   Cancer Mother 20       uterine, mult myeloma   CVA Father    Cancer Sister 77       breast   Leukemia Sister        CLL   Cancer Maternal Grandmother 55       breast   Lupus Sister        twin sister   Sarcoidosis Sister        twin sister   Rheum arthritis Sister        twin sister   Celiac disease Daughter       SOCIAL HISTORY:  Social History   Socioeconomic History   Marital status: Married    Spouse name: Not on file   Number of children: Not on file   Years of education: Not on file   Highest education level: Not on file  Occupational History   Not on file  Tobacco Use   Smoking status: Never   Smokeless tobacco: Never  Substance and Sexual Activity   Alcohol use: No    Alcohol/week: 0.0 standard drinks    Comment: occasionally   Drug use: No   Sexual activity: Yes  Other Topics Concern   Not on file  Social History Narrative   Not on file   Social Determinants of Health   Financial Resource Strain: Not on file  Food Insecurity: Not on file  Transportation  Needs: Not on file  Physical Activity: Not on file  Stress: Not on file  Social Connections: Not on file  Intimate Partner Violence: Not on file      PHYSICAL EXAMINATION:  Vital Signs: Vitals:   01/15/22 1146  BP: (!) 156/86  Pulse: 82  Resp: 16  Temp: 97.7 F (36.5 C)  SpO2: 100%   Filed Weights   01/15/22 1146  Weight: 150 lb 12.8 oz (68.4 kg)   General: Well-nourished, well-appearing female in no acute distress.  Unaccompanied today.   HEENT: Head is normocephalic.  Pupils equal and reactive to light. Conjunctivae clear without exudate.  Sclerae anicteric. Oral mucosa is pink, moist.  Oropharynx is pink without lesions or erythema.  Lymph: No cervical, supraclavicular, or infraclavicular lymphadenopathy noted on palpation.  Cardiovascular: Regular rate and rhythm.[name] Respiratory: Clear to auscultation bilaterally. Chest expansion symmetric; breathing non-labored.  Breast Exam:  Breasts: s/p bilateral mastectomies and implant placement.  No nodules, masses, sign of recurrence -Axilla: No axillary adenopathy bilaterally.  GI: Abdomen soft and round; non-tender, non-distended. Bowel sounds normoactive. No hepatosplenomegaly.   GU: Deferred.  Neuro: No focal deficits. Steady gait.  Psych: Mood and affect normal and appropriate for situation.  MSK: No focal spinal tenderness to palpation, full range of motion in bilateral upper extremities Extremities: No edema. Skin: Warm and dry.  LABORATORY DATA:  None for this visit   DIAGNOSTIC IMAGING:  Most recent mammogram: n/a s/p bilateral mastectomies MRI 07/2019 negative    ASSESSMENT AND PLAN:  Ms.. Gutierrez is a pleasant 66 y.o. female with history of Stage IIA left breast invasive ductal carcinoma, ER+/PR+/HER2+, diagnosed in 11/2012, treated with bilateral mastectomies, adjuvant chemotherapy, and maintenance trastuzumab.  She was unable to tolerate antiestrogen therapy.  She presents to the Survivorship Clinic for surveillance and routine follow-up.   1. History of breast cancer:  Jane Gutierrez is currently clinically and radiographically without evidence of disease or  recurrence of breast cancer.  She is now 8 years out from her initial diagnosis and treatment.  She will transition back to f/u with gynecology for her annual breast exams.      2. Sleep pattern disturbance: I sent in dose reduced Lunesta so she can continue to wean off of it.    3. H/o cardiac issues with Herceptin: I placed a referral to cardiology at her request.  They can order and follow repeat echo and make recommendations.    4. Concern about malignant hematology disorders: I let her know that she could send [place] the labs collected by her PCP and that myself and Dr. Lindi Adie would review them for concerns.    5. Bone health:  She was given education on specific food and activities to promote bone health.  6. Cancer screening:  Due to Jane Gutierrez history and her age, she should receive screening for skin cancers, colon cancer (more frequent due to Chek 2 positivity), and gynecologic cancers. She was encouraged to follow-up with her PCP for appropriate cancer screenings.   7. Health maintenance and wellness promotion: Jane Gutierrez was encouraged to consume 5-7 servings of fruits and vegetables per day. She was also encouraged to engage in moderate to vigorous exercise for 30 minutes per day most days of the week. She was instructed to limit her alcohol consumption and continue to abstain from tobacco use.      Dispo:  -Return to cancer center annually  Total encounter time: 20 minutes*in face-to-face visit time, chart review, lab review, care  coordination, and documentation of the encounter.  Wilber Bihari, NP 01/15/22 3:15 PM Medical Oncology and Hematology [facility] [place], [place] [address] Tel. [phone]    Fax. [phone]  *Total Encounter Time as defined by the Centers for Medicare and Medicaid Services includes, in addition to the face-to-face time of a patient visit (documented in the note above) non-face-to-face time: obtaining and reviewing  outside history, ordering and reviewing medications, tests or procedures, care coordination (communications with other health care professionals or caregivers) and documentation in the medical record.    Note: PRIMARY CARE PROVIDER Reynold Bowen, [place] [phone]

## 2022-01-15 ENCOUNTER — Other Ambulatory Visit (HOSPITAL_COMMUNITY): Payer: Self-pay

## 2022-01-15 ENCOUNTER — Inpatient Hospital Stay: Payer: 59 | Attending: Adult Health | Admitting: Adult Health

## 2022-01-15 ENCOUNTER — Encounter: Payer: Self-pay | Admitting: Adult Health

## 2022-01-15 ENCOUNTER — Other Ambulatory Visit: Payer: Self-pay

## 2022-01-15 VITALS — BP 156/86 | HR 82 | Temp 97.7°F | Resp 16 | Ht 65.0 in | Wt 150.8 lb

## 2022-01-15 DIAGNOSIS — Z853 Personal history of malignant neoplasm of breast: Secondary | ICD-10-CM | POA: Insufficient documentation

## 2022-01-15 DIAGNOSIS — C50412 Malignant neoplasm of upper-outer quadrant of left female breast: Secondary | ICD-10-CM

## 2022-01-15 DIAGNOSIS — G479 Sleep disorder, unspecified: Secondary | ICD-10-CM | POA: Insufficient documentation

## 2022-01-15 DIAGNOSIS — G4720 Circadian rhythm sleep disorder, unspecified type: Secondary | ICD-10-CM | POA: Diagnosis not present

## 2022-01-15 DIAGNOSIS — Z170 Estrogen receptor positive status [ER+]: Secondary | ICD-10-CM

## 2022-01-15 MED ORDER — ESZOPICLONE 1 MG PO TABS
1.0000 mg | ORAL_TABLET | Freq: Every evening | ORAL | 0 refills | Status: DC | PRN
  Filled 2022-01-15: qty 30, 30d supply, fill #0

## 2022-01-16 DIAGNOSIS — E039 Hypothyroidism, unspecified: Secondary | ICD-10-CM | POA: Diagnosis not present

## 2022-01-16 DIAGNOSIS — E785 Hyperlipidemia, unspecified: Secondary | ICD-10-CM | POA: Diagnosis not present

## 2022-01-24 DIAGNOSIS — E785 Hyperlipidemia, unspecified: Secondary | ICD-10-CM | POA: Diagnosis not present

## 2022-01-24 DIAGNOSIS — I700 Atherosclerosis of aorta: Secondary | ICD-10-CM | POA: Diagnosis not present

## 2022-01-24 DIAGNOSIS — E039 Hypothyroidism, unspecified: Secondary | ICD-10-CM | POA: Diagnosis not present

## 2022-01-24 DIAGNOSIS — F900 Attention-deficit hyperactivity disorder, predominantly inattentive type: Secondary | ICD-10-CM | POA: Diagnosis not present

## 2022-01-24 DIAGNOSIS — G729 Myopathy, unspecified: Secondary | ICD-10-CM | POA: Diagnosis not present

## 2022-01-24 DIAGNOSIS — Z8541 Personal history of malignant neoplasm of cervix uteri: Secondary | ICD-10-CM | POA: Diagnosis not present

## 2022-01-24 DIAGNOSIS — E041 Nontoxic single thyroid nodule: Secondary | ICD-10-CM | POA: Diagnosis not present

## 2022-01-24 DIAGNOSIS — Z1212 Encounter for screening for malignant neoplasm of rectum: Secondary | ICD-10-CM | POA: Diagnosis not present

## 2022-01-24 DIAGNOSIS — R82998 Other abnormal findings in urine: Secondary | ICD-10-CM | POA: Diagnosis not present

## 2022-01-24 DIAGNOSIS — Z1331 Encounter for screening for depression: Secondary | ICD-10-CM | POA: Diagnosis not present

## 2022-01-24 DIAGNOSIS — Z853 Personal history of malignant neoplasm of breast: Secondary | ICD-10-CM | POA: Diagnosis not present

## 2022-01-24 DIAGNOSIS — Z0000 Encounter for general adult medical examination without abnormal findings: Secondary | ICD-10-CM | POA: Diagnosis not present

## 2022-02-01 ENCOUNTER — Other Ambulatory Visit (HOSPITAL_COMMUNITY): Payer: Self-pay

## 2022-02-01 MED ORDER — NITROFURANTOIN MONOHYD MACRO 100 MG PO CAPS
100.0000 mg | ORAL_CAPSULE | Freq: Two times a day (BID) | ORAL | 0 refills | Status: DC
  Filled 2022-02-01: qty 10, 5d supply, fill #0

## 2022-02-02 ENCOUNTER — Telehealth: Payer: 59 | Admitting: Nurse Practitioner

## 2022-02-02 DIAGNOSIS — A6009 Herpesviral infection of other urogenital tract: Secondary | ICD-10-CM | POA: Diagnosis not present

## 2022-02-02 DIAGNOSIS — T3695XA Adverse effect of unspecified systemic antibiotic, initial encounter: Secondary | ICD-10-CM | POA: Diagnosis not present

## 2022-02-02 DIAGNOSIS — A6000 Herpesviral infection of urogenital system, unspecified: Secondary | ICD-10-CM

## 2022-02-02 DIAGNOSIS — B379 Candidiasis, unspecified: Secondary | ICD-10-CM

## 2022-02-02 MED ORDER — FLUCONAZOLE 150 MG PO TABS: 150.0000 mg | ORAL_TABLET | Freq: Once | ORAL | 0 refills | Status: AC

## 2022-02-02 MED ORDER — ACYCLOVIR 5 % EX CREA: 1.0000 "application " | TOPICAL_CREAM | Freq: Three times a day (TID) | CUTANEOUS | 0 refills | Status: DC

## 2022-02-02 NOTE — Progress Notes (Signed)
E-Visit for Vaginal Symptoms  We are sorry that you are not feeling well. Here is how we plan to help! Based on what you shared with me it looks like you: May have a yeast vaginosis  Vaginosis is an inflammation of the vagina that can result in discharge, itching and pain. The cause is usually a change in the normal balance of vaginal bacteria or an infection. Vaginosis can also result from reduced estrogen levels after menopause.  The most common causes of vaginosis are:   Bacterial vaginosis which results from an overgrowth of one on several organisms that are normally present in your vagina.   Yeast infections which are caused by a naturally occurring fungus called candida.   Vaginal atrophy (atrophic vaginosis) which results from the thinning of the vagina from reduced estrogen levels after menopause.   Trichomoniasis which is caused by a parasite and is commonly transmitted by sexual intercourse.  Factors that increase your risk of developing vaginosis include: Medications, such as antibiotics and steroids Uncontrolled diabetes Use of hygiene products such as bubble bath, vaginal spray or vaginal deodorant Douching Wearing damp or tight-fitting clothing Using an intrauterine device (IUD) for birth control Hormonal changes, such as those associated with pregnancy, birth control pills or menopause Sexual activity Having a sexually transmitted infection  Your treatment plan is A single Diflucan (fluconazole) 150mg  tablet once.  I have electronically sent this prescription into the pharmacy that you have chosen.  We can also refill your vaginal cream for you as well   Meds ordered this encounter  Medications   acyclovir cream (ZOVIRAX) 5 %    Sig: Apply 1 application topically in the morning, at noon, and at bedtime.    Dispense:  15 g    Refill:  0   fluconazole (DIFLUCAN) 150 MG tablet    Sig: Take 1 tablet (150 mg total) by mouth once for 1 dose.    Dispense:  1 tablet     Refill:  0      Be sure to take all of the medication as directed. Stop taking any medication if you develop a rash, tongue swelling or shortness of breath. Mothers who are breast feeding should consider pumping and discarding their breast milk while on these antibiotics. However, there is no consensus that infant exposure at these doses would be harmful.  Remember that medication creams can weaken latex condoms. [name]   HOME CARE:  Good hygiene may prevent some types of vaginosis from recurring and may relieve some symptoms:  Avoid baths, hot tubs and whirlpool spas. Rinse soap from your outer genital area after a shower, and dry the area well to prevent irritation. Don't use scented or harsh soaps, such as those with deodorant or antibacterial action. Avoid irritants. These include scented tampons and pads. Wipe from front to back after using the toilet. Doing so avoids spreading fecal bacteria to your vagina.  Other things that may help prevent vaginosis include:  Don't douche. Your vagina doesn't require cleansing other than normal bathing. Repetitive douching disrupts the normal organisms that reside in the vagina and can actually increase your risk of vaginal infection. Douching won't clear up a vaginal infection. Use a latex condom. Both female and female latex condoms may help you avoid infections spread by sexual contact. Wear cotton underwear. Also wear pantyhose with a cotton crotch. If you feel comfortable without it, skip wearing underwear to bed. Yeast thrives in [employer] Your symptoms should improve in the next day  or two.  GET HELP RIGHT AWAY IF:  You have pain in your lower abdomen ( pelvic area or over your ovaries) You develop nausea or vomiting You develop a fever Your discharge changes or worsens You have persistent pain with intercourse You develop shortness of breath, a rapid pulse, or you faint.  These symptoms could be signs of problems or infections that  need to be evaluated by a medical provider now.  MAKE SURE YOU   Understand these instructions. Will watch your condition. Will get help right away if you are not doing well or get worse.  Thank you for choosing an e-visit.  Your e-visit answers were reviewed by a board certified advanced clinical practitioner to complete your personal care plan. Depending upon the condition, your plan could have included both over the counter or prescription medications.  Please review your pharmacy choice. Make sure the pharmacy is open so you can pick up prescription now. If there is a problem, you may contact your provider through [employer] and have the prescription routed to another pharmacy.  Your safety is important to [place]. If you have drug allergies check your prescription carefully.   For the next 24 hours you can use MyChart to ask questions about today's visit, request a non-urgent call back, or ask for a work or school excuse. You will get an email in the next two days asking about your experience. I Brytney that your e-visit has been valuable and will speed your recovery.   I spent approximately 7 minutes reviewing the patient's history, current symptoms and coordinating their plan of care today.

## 2022-02-08 ENCOUNTER — Encounter: Payer: Self-pay | Admitting: Interventional Cardiology

## 2022-02-08 ENCOUNTER — Other Ambulatory Visit: Payer: Self-pay

## 2022-02-08 ENCOUNTER — Ambulatory Visit: Payer: 59 | Admitting: Interventional Cardiology

## 2022-02-08 VITALS — BP 132/80 | HR 81 | Ht 65.0 in | Wt 150.8 lb

## 2022-02-08 DIAGNOSIS — R0609 Other forms of dyspnea: Secondary | ICD-10-CM

## 2022-02-08 DIAGNOSIS — I7300 Raynaud's syndrome without gangrene: Secondary | ICD-10-CM

## 2022-02-08 NOTE — Progress Notes (Signed)
Cardiology Office Note   Date:  02/08/2022   ID:  Jane Gutierrez, DOB 05/16/1956, MRN [id number]  PCP:  Reynold Bowen, MD    No chief complaint on file.    Wt Readings from Last 3 Encounters:  02/08/22 150 lb 12.8 oz (68.4 kg)  01/15/22 150 lb 12.8 oz (68.4 kg)  01/05/21 152 lb 9.3 oz (69.2 kg)       History of Present Illness: Jane Gutierrez is a 66 y.o. female who is being seen today for the evaluation of CHF at the request of Wilber Bihari Cornett*.  Records show: "[facility] liaison with a PMH of IBS, asthma, hypothroidism and triple-positive breast CA. ER +100% PR +86% HER-2/neu (Sentinel nodes were negative for metastatic disease).  She is s/p bilateral mastectomies with reconstruction.     Plan for adjuvant chemotherapy and Herceptin.  On 02/04/13 she started Taxotere/carboplatinum to be given every 3 weeks for a total of 6 cycles. Since then, she has been on Herceptin alone.  She completed Herceptin in 2/15.     VQ 05/27/13 Negative for PE   Echos: 01/26/13 EF 60% lateral S' 35.7 Grade II diastolic dysfunction 0/1/77 EF 60-65% lat s' 12.8  RV normal 05/27/13 EF lat S' 12.5 RV mildly dilated. Normal function. +RAE. IVC dilated 2.8 cm. Mod TR  RVSP 35-40.  06/04/13 TEE EF 55-60% mild-mod TR 09/09/13 EF 55-60% lateral s' 12.1 RV ok. Mild TR RVSP 25-30 12/06/13 EF 55-60% lateral s' 11.6, GS -21%, RV ok. 3/15 EF 55-60%, lateral s' 12, global longitudinal strain -19.8%, RV normal, mild RAE"  She wanted to have some cardiology f/u.    She has had some DOE at times.  Sometimes, she feels better than others.  Father had sudden death.  Autoimmune diseases run in the family.   Past Medical History:  Diagnosis Date   Abnormally small mouth    ADD (attention deficit disorder)    ADHD    Allergy    Anxiety    pt reported she doesn't have anxiety   Asthma    prn inhaler   Blood transfusion without reported diagnosis    Breast cancer ([place])    Dental crowns present    Difficulty  swallowing pills    History of breast cancer    History of cervical cancer    History of palpitations    last echo 08/22/2014   Hypothyroidism    IBS (irritable bowel syndrome)     Past Surgical History:  Procedure Laterality Date   ABDOMINAL HYSTERECTOMY     ABDOMINOPLASTY  03/28/2004   AXILLARY SENTINEL NODE BIOPSY  12/31/2012   Procedure: AXILLARY SENTINEL NODE BIOPSY;  Surgeon: Rolm Bookbinder, MD;  Location: [place];  Service: General;  Laterality: Bilateral;  bilateral sentinel node   BRAIN SURGERY     removal benign frontal lobe cyst in [place] Bilateral ~ 2008   BREAST IMPLANT EXCHANGE Bilateral 12/11/2017   Procedure: BILATERAL IMPLANT EXCHANGE;  Surgeon: Wallace Going, DO;  Location: [place];  Service: Plastics;  Laterality: Bilateral;   BREAST RECONSTRUCTION WITH PLACEMENT OF TISSUE EXPANDER AND FLEX HD (ACELLULAR HYDRATED DERMIS)  12/31/2012   Procedure: BREAST RECONSTRUCTION WITH PLACEMENT OF TISSUE EXPANDER AND FLEX HD (ACELLULAR HYDRATED DERMIS);  Surgeon: Theodoro Kos, DO;  Location: [place];  Service: Plastics;  Laterality: Bilateral;  bilateral immediate breast reconstruction with expanders and flex hd    CAPSULOTOMY Left  09/01/2014   Procedure: CAPSULOTOMY OF LEFT BREAST WITH LIPOFILLING FOR SYMMETRY;  Surgeon: Theodoro Kos, DO;  Location: [place];  Service: Plastics;  Laterality: Left;   CERVICAL CERCLAGE     DIAGNOSTIC LAPAROSCOPY  05/12/2003   right partial salpingectomy; lysis paraovarian adhesions left   DILATION AND CURETTAGE OF UTERUS  2005   ENDOMETRIAL FULGURATION  05/12/2003   FACIAL COSMETIC SURGERY  ~ 2010   FOOT SURGERY Bilateral 07/2012   GYNECOLOGIC CRYOSURGERY  1998   INCISION AND DRAINAGE OF WOUND Left 04/28/2013   Procedure: IRRIGATION AND DEBRIDEMENT LEFT BREAST WOUND, POSSIBLE CLOSURE OF WOUND WITH DRAIN PLACEMENT;  Surgeon: Theodoro Kos, DO;   Location: [place];  Service: Plastics;  Laterality: Left;   LATISSIMUS FLAP TO BREAST Left 09/29/2013   Procedure: LATISSIMUS FLAP TO BREAST WITH TISSUE EXPANDER PLACEMENT FOR LEFT BREAST RECONSTRUCTION;  Surgeon: Theodoro Kos, DO;  Location: [place];  Service: Plastics;  Laterality: Left;   LIPECTOMY Bilateral 03/28/2004   hip   LIPOSUCTION Bilateral 03/09/2014   Procedure: LIPOSUCTION ABDOMEN W/ LIPOFILLING LATERAL BILATERAL BREAST;  Surgeon: Theodoro Kos, DO;  Location: [place];  Service: Plastics;  Laterality: Bilateral;  LIPOSUCTION NECK   LIPOSUCTION WITH LIPOFILLING Bilateral 09/01/2014   Procedure: LIPOSUCTION WITH LIPOFILLING;  Surgeon: Theodoro Kos, DO;  Location: [place];  Service: Plastics;  Laterality: Bilateral;   LYSIS OF ADHESION  03/28/2004   pelvic   MASTECTOMY     PORT-A-CATH REMOVAL Right 03/09/2014   Procedure: REMOVAL PORT-A-CATH;  Surgeon: Theodoro Kos, DO;  Location: [place];  Service: Plastics;  Laterality: Right;   PORTACATH PLACEMENT  01/27/2013   Procedure: INSERTION PORT-A-CATH;  Surgeon: Rolm Bookbinder, MD;  Location: [place];  Service: General;  Laterality: Right;   RADIOFREQUENCY ABLATION NERVES  05/12/2003   uterosacral nerve ablation   REMOVAL OF BILATERAL TISSUE EXPANDERS WITH PLACEMENT OF BILATERAL BREAST IMPLANTS Bilateral 03/09/2014   Procedure: REMOVAL OF BILATERAL TISSUE EXPANDERS WITH PLACEMENT OF BILATERAL BREAST IMPLANTS;  Surgeon: Theodoro Kos, DO;  Location: [place];  Service: Plastics;  Laterality: Bilateral;   TEE WITHOUT CARDIOVERSION N/A 06/04/2013   Procedure: TRANSESOPHAGEAL ECHOCARDIOGRAM (TEE);  Surgeon: Jolaine Artist, MD;  Location: [facility];  Service: Cardiovascular;  Laterality: N/A;   TISSUE EXPANDER PLACEMENT Left 06/16/2013   Procedure: LEFT BREAST TISSUE EXPANDER REMOVAL;  Surgeon: Theodoro Kos, DO;  Location: [place];  Service: Plastics;  Laterality: Left;   TISSUE EXPANDER PLACEMENT Left 09/29/2013   Procedure: TISSUE EXPANDER;  Surgeon: Theodoro Kos, DO;  Location: [place];  Service: Plastics;  Laterality: Left;   TOTAL ABDOMINAL HYSTERECTOMY W/ BILATERAL SALPINGOOPHORECTOMY  03/28/2004   TOTAL MASTECTOMY  12/31/2012   Procedure: TOTAL MASTECTOMY;  Surgeon: Rolm Bookbinder, MD;  Location: [place];  Service: General;  Laterality: Bilateral;  bilateral total mastectomies     Current Outpatient Medications  Medication Sig Dispense Refill   Biotin 1 MG CAPS Take 1 capsule by mouth daily.     COVID-19 mRNA bivalent vaccine, Pfizer, (PFIZER COVID-19 VAC BIVALENT) injection Inject into the muscle. 0.3 mL 0   dextroamphetamine (DEXTROSTAT) 10 MG tablet Take 2 tablets (20 mg total) by mouth 2 (two) times daily. 360 tablet 0   EPINEPHrine 0.3 mg/0.3 mL IJ SOAJ injection Inject 0.3 mg (1 pen) into the muscle as needed. 2 each 1   eszopiclone (LUNESTA) 1 MG TABS tablet Take 1 tablet (1  mg total) by mouth at bedtime as needed for sleep. Take immediately before bedtime 30 tablet 0   fluconazole (DIFLUCAN) 150 MG tablet Take 1 tablet by mouth now and repeat in 3 & 6 days 3 tablet 4   hyoscyamine (ANASPAZ) 0.125 MG TBDP disintergrating tablet Place 0.125 mg under the tongue as needed.      loratadine (CLARITIN) 10 MG tablet Take 1 tab po QD     Multiple Vitamin (MULTIVITAMIN WITH MINERALS) TABS Take 1 tablet by mouth daily.     nitrofurantoin, macrocrystal-monohydrate, (MACROBID) 100 MG capsule Take 1 capsule (100 mg total) by mouth every 12 (twelve) hours with food for 5 days 10 capsule 0   phenazopyridine (PYRIDIUM) 200 MG tablet TAKE 1 TABLET BY MOUTH 3 TIMES DAILY 9 tablet 2   rifaximin (XIFAXAN) 550 MG TABS tablet TAKE 1 TABLET BY MOUTH THREE TIMES DAILY FOR 14 DAYS. 42 tablet 3   SHINGRIX injection      SYNTHROID 100 MCG tablet Take 1 tablet (100 mcg total) by mouth every morning. 90  tablet 3   valACYclovir (VALTREX) 1000 MG tablet Take 1 tablet (1,000 mg total) by mouth daily. 90 tablet 4   vitamin E 600 UNIT capsule Take 800 Units by mouth daily.      acyclovir cream (ZOVIRAX) 5 % Apply 1 application topically in the morning, at noon, and at bedtime. (Patient not taking: Reported on 02/08/2022) 15 g 0   albuterol (VENTOLIN HFA) 108 (90 Base) MCG/ACT inhaler INHALE 2 PUFFS BY MOUTH EVERY 6 HOURS AS NEEDED 18 g 3   B-Complex TABS Take 1 tab po qd (Patient not taking: Reported on 02/08/2022)     COVID-19 At Home Antigen Test (CARESTART COVID-19 HOME TEST) KIT Use as directed 4 each 0   fluocinonide gel (LIDEX) 0.05 % Apply topically. (Patient not taking: Reported on 02/08/2022)     phenazopyridine (PYRIDIUM) 100 MG tablet Take 1 tablet (100 mg total) by mouth 2 (two) times daily as needed. (Patient not taking: Reported on 02/08/2022) 10 tablet 2   predniSONE (STERAPRED UNI-PAK 21 TAB) 5 MG (21) TBPK tablet Take as directed within package. Take with food 21 tablet 0   No current facility-administered medications for this visit.    Allergies:   Betadine [povidone iodine], Doxycycline, Iodine, Shellfish-derived products, Amoxicillin, Cleocin [clindamycin hcl], Ginger, Penicillins, and Sulfamethoxazole-trimethoprim    Social History:  The patient  reports that she has never smoked. She has never used smokeless tobacco. She reports that she does not drink alcohol and does not use drugs.   Family History:  The patient's family history includes CVA in her father; Cancer (age of onset: 60) in her sister; Cancer (age of onset: 32) in her maternal grandmother; Cancer (age of onset: 60) in her mother; Celiac disease in her daughter; Leukemia in her sister; Lupus in her sister; Rheum arthritis in her sister; Sarcoidosis in her sister.    ROS:  Please see the history of present illness.   Otherwise, review of systems are positive for occasional DOE.   All other systems are reviewed and  negative.    PHYSICAL EXAM: VS:  BP 132/80    Pulse 81    Ht $R'5\' 5"'uP$  (1.651 m)    Wt 150 lb 12.8 oz (68.4 kg)    SpO2 96%    BMI 25.09 kg/m  , BMI Body mass index is 25.09 kg/m. GEN: Well nourished, well developed, in no acute distress HEENT: normal Neck: no JVD,  carotid bruits, or masses Cardiac: RRR; no murmurs, rubs, or gallops,no edema  Respiratory:  clear to auscultation bilaterally, normal work of breathing GI: soft, nontender, nondistended, + BS MS: no deformity or atrophy Skin: warm and dry, no rash Neuro:  Strength and sensation are intact Psych: euthymic mood, full affect   EKG:   The ekg ordered today demonstrates NSR, no ST changes   Recent Labs: No results found for requested labs within last 8760 hours.   Lipid Panel No results found for: CHOL, TRIG, HDL, CHOLHDL, VLDL, LDLCALC, LDLDIRECT   Other studies Reviewed: Additional studies/ records that were reviewed today with results demonstrating: labs reviewed.   ASSESSMENT AND PLAN:  DOE: Prior herceptin use.  Plan for echo to evaluate for cardiac cause of dyspnea. Plan for calcium scoring test.  LDL 127.  HDL is high.  If her calcium score was high, would initiate statin therapy.  If calcium score is 0, would not start statin. Family h/o of multiple cancers.  Raynauds disease:  Manages conservatively.  Could use vasodilators if symptoms get worse.  She tries to stay warm and wearing gloves to minimize symptoms to her fingers.   Current medicines are reviewed at length with the patient today.  The patient concerns regarding her medicines were addressed.  The following changes have been made:  No change  Labs/ tests ordered today include: as above No orders of the defined types were placed in this encounter.   Recommend 150 minutes/week of aerobic exercise Low fat, low carb, high fiber diet recommended  Disposition:   FU in based on test results   Signed, Larae Grooms, MD  02/08/2022 2:58 PM     [facility] [address], [place], [place]  [address] Phone: [phone]; Fax: [phone]

## 2022-02-08 NOTE — Patient Instructions (Signed)
Medication Instructions:  Your physician recommends that you continue on your current medications as directed. Please refer to the Current Medication list given to you today.  *If you need a refill on your cardiac medications before your next appointment, please call your pharmacy*   Lab Work: none If you have labs (blood work) drawn today and your tests are completely normal, you will receive your results only by: [place] (if you have MyChart) OR A paper copy in the mail If you have any lab test that is abnormal or we need to change your treatment, we will call you to review the results.   Testing/Procedures: Your physician has requested that you have an echocardiogram. Echocardiography is a painless test that uses sound waves to create images of your heart. It provides your doctor with information about the size and shape of your heart and how well your hearts chambers and valves are working. This procedure takes approximately one hour. There are no restrictions for this procedure.  Dr Irish Lack recommends you have a Calcium Score CT Scan   Follow-Up: At [facility], you and your health needs are our priority.  As part of our continuing mission to provide you with exceptional heart care, we have created designated Provider Care Teams.  These Care Teams include your primary Cardiologist (physician) and Advanced Practice Providers (APPs -  Physician Assistants and Nurse Practitioners) who all work together to provide you with the care you need, when you need it.  We recommend signing up for the patient portal called "MyChart".  Sign up information is provided on this After Visit Summary.  MyChart is used to connect with patients for Virtual Visits (Telemedicine).  Patients are able to view lab/test results, encounter notes, upcoming appointments, etc.  Non-urgent messages can be sent to your provider as well.   To learn more about what you can do with MyChart, go to  [email or web address].    Your next appointment:   Based on results  The format for your next appointment:   In Person  Provider:   Larae Grooms, MD     Other Instructions

## 2022-02-18 ENCOUNTER — Other Ambulatory Visit (HOSPITAL_COMMUNITY): Payer: Self-pay

## 2022-02-18 ENCOUNTER — Other Ambulatory Visit: Payer: Self-pay | Admitting: Adult Health

## 2022-02-18 DIAGNOSIS — G4720 Circadian rhythm sleep disorder, unspecified type: Secondary | ICD-10-CM

## 2022-02-18 MED ORDER — ESZOPICLONE 1 MG PO TABS
1.0000 mg | ORAL_TABLET | Freq: Every evening | ORAL | 0 refills | Status: DC | PRN
  Filled 2022-02-18: qty 30, 30d supply, fill #0

## 2022-02-19 ENCOUNTER — Other Ambulatory Visit (HOSPITAL_COMMUNITY): Payer: Self-pay

## 2022-02-25 ENCOUNTER — Ambulatory Visit
Admission: RE | Admit: 2022-02-25 | Discharge: 2022-02-25 | Disposition: A | Discharge status: Home / Self Care | Payer: 59 | Source: Ambulatory Visit | Attending: Endocrinology | Admitting: Endocrinology

## 2022-02-25 ENCOUNTER — Other Ambulatory Visit: Payer: Self-pay

## 2022-02-25 ENCOUNTER — Other Ambulatory Visit (HOSPITAL_COMMUNITY): Payer: Self-pay

## 2022-02-25 ENCOUNTER — Other Ambulatory Visit: Payer: Self-pay | Admitting: Endocrinology

## 2022-02-25 ENCOUNTER — Ambulatory Visit (INDEPENDENT_AMBULATORY_CARE_PROVIDER_SITE_OTHER)
Admission: RE | Admit: 2022-02-25 | Discharge: 2022-02-25 | Disposition: A | Discharge status: Home / Self Care | Payer: Self-pay | Source: Ambulatory Visit | Attending: Interventional Cardiology | Admitting: Interventional Cardiology

## 2022-02-25 ENCOUNTER — Ambulatory Visit (HOSPITAL_COMMUNITY): Payer: 59 | Attending: Internal Medicine

## 2022-02-25 DIAGNOSIS — R519 Headache, unspecified: Secondary | ICD-10-CM | POA: Diagnosis not present

## 2022-02-25 DIAGNOSIS — R0609 Other forms of dyspnea: Secondary | ICD-10-CM | POA: Diagnosis not present

## 2022-02-25 LAB — ECHOCARDIOGRAM COMPLETE
Area-P 1/2: 3.39 cm2
S' Lateral: 2.9 cm

## 2022-02-26 ENCOUNTER — Other Ambulatory Visit (HOSPITAL_COMMUNITY): Payer: Self-pay

## 2022-02-27 ENCOUNTER — Ambulatory Visit: Payer: 59 | Admitting: Neurology

## 2022-02-27 ENCOUNTER — Other Ambulatory Visit (HOSPITAL_COMMUNITY): Payer: Self-pay

## 2022-02-27 ENCOUNTER — Telehealth: Payer: Self-pay | Admitting: *Deleted

## 2022-02-27 ENCOUNTER — Encounter: Payer: Self-pay | Admitting: Neurology

## 2022-02-27 VITALS — BP 147/90 | HR 86 | Ht 64.0 in | Wt 152.6 lb

## 2022-02-27 DIAGNOSIS — G4485 Primary stabbing headache: Secondary | ICD-10-CM | POA: Diagnosis not present

## 2022-02-27 DIAGNOSIS — G4484 Primary exertional headache: Secondary | ICD-10-CM | POA: Diagnosis not present

## 2022-02-27 DIAGNOSIS — R519 Headache, unspecified: Secondary | ICD-10-CM | POA: Diagnosis not present

## 2022-02-27 DIAGNOSIS — H539 Unspecified visual disturbance: Secondary | ICD-10-CM | POA: Diagnosis not present

## 2022-02-27 DIAGNOSIS — R51 Headache with orthostatic component, not elsewhere classified: Secondary | ICD-10-CM | POA: Diagnosis not present

## 2022-02-27 DIAGNOSIS — E785 Hyperlipidemia, unspecified: Secondary | ICD-10-CM

## 2022-02-27 DIAGNOSIS — H93A9 Pulsatile tinnitus, unspecified ear: Secondary | ICD-10-CM | POA: Diagnosis not present

## 2022-02-27 MED ORDER — ROSUVASTATIN CALCIUM 10 MG PO TABS
10.0000 mg | ORAL_TABLET | Freq: Every day | ORAL | 3 refills | Status: DC
  Filled 2022-02-27: qty 90, 90d supply, fill #0

## 2022-02-27 MED ORDER — INDOMETHACIN 50 MG PO CAPS
50.0000 mg | ORAL_CAPSULE | Freq: Three times a day (TID) | ORAL | 1 refills | Status: DC | PRN
  Filled 2022-02-27 – 2022-02-28 (×2): qty 90, 30d supply, fill #0

## 2022-02-27 NOTE — Progress Notes (Signed)
[facility]    Provider:  Dr Lucia Gaskins Requesting Provider: Adrian Prince, MD Primary Care Provider:  Adrian Prince, MD  CC:  Headaches/stabbing/new  HPI:  Jane Gutierrez is a 66 y.o. female here as requested by Adrian Prince, MD for stabbing headaches. PMHx raynauds, migraines, icepick headaches as a child (these are different). She is here alone and reports when she was in 6th grade she was diagnosed with ice-pick headaches always in her right temple. In 1987 she had a headache and she has a benign frontal lobe cyst that was removed. 345 Monday morning she woke up and she was having stabbing headaches, quick, in the back of the head, not like her previous headaches completely different, she called Dr. Evlyn Kanner and got a toradol shot, she had it for 16 hours, she slept Tuesday morning had them again, yesterday she feltlike she was hit with a mack truck, head was throbbing, last night the jabbing started again, 20 of them, can occur 100s of times a day, no autonomic symptoms. Today she had them for 6 hours her head it is wearing her out. No other focal neurologic deficits, associated symptoms, inciting events or modifiable factors.  Reviewed notes, labs and imaging from outside physicians, which showed  CT head (personally reviewed images and agree): FINDINGS: Brain: No acute intracranial findings are seen in noncontrast CT brain. There are no signs of bleeding within the cranium. Cortical sulci are prominent.   Vascular: Unremarkable.   Skull: Unremarkable.   Sinuses/Orbits: Unremarkable.   Other: None   IMPRESSION: No acute intracranial findings are seen in noncontrast CT brain. Atrophy.  In 2018 had rehaumatologic labs: ana mildly positive 1:80 unlikely clinically significant, rf and cc neg, esr/crp nml  Review of Systems: Patient complains of symptoms per HPI as well as the following symptoms stabbing headace. Pertinent negatives and positives per HPI. All  others negative.   Social History   Socioeconomic History   Marital status: Married    Spouse name: Not on file   Number of children: Not on file   Years of education: Not on file   Highest education level: Not on file  Occupational History   Not on file  Tobacco Use   Smoking status: Never   Smokeless tobacco: Never  Substance and Sexual Activity   Alcohol use: No    Alcohol/week: 0.0 standard drinks    Comment: occasionally   Drug use: No   Sexual activity: Yes  Other Topics Concern   Not on file  Social History Narrative   Not on file   Social Determinants of Health   Financial Resource Strain: Not on file  Food Insecurity: Not on file  Transportation Needs: Not on file  Physical Activity: Not on file  Stress: Not on file  Social Connections: Not on file  Intimate Partner Violence: Not on file    Family History  Problem Relation Age of Onset   Cancer Mother 74       uterine, mult myeloma   CVA Father    Cancer Sister 54       breast   Leukemia Sister        CLL   Lupus Sister        twin sister   Sarcoidosis Sister        twin sister   Rheum arthritis Sister        twin sister   Cancer Maternal Grandmother 43       breast  Celiac disease Daughter    Migraines Daughter     Past Medical History:  Diagnosis Date   Abnormally small mouth    ADD (attention deficit disorder)    ADHD    Allergy    Anxiety    pt reported she doesn't have anxiety   Asthma    prn inhaler   Blood transfusion without reported diagnosis    Breast cancer ([place])    Dental crowns present    Difficulty swallowing pills    History of breast cancer    History of cervical cancer    History of palpitations    last echo 08/22/2014   Hypothyroidism    IBS (irritable bowel syndrome)     Patient Active Problem List   Diagnosis Date Noted   Encounter for counseling 10/16/2021   Hardening of the aorta (main artery of the heart) ([place]) 03/02/2021   Genital herpes simplex  02/22/2021   Family history of diseases of the blood and blood-forming organs and certain disorders involving the immune mechanism 10/21/2019   Polyneuropathy 10/21/2019   Rash and other nonspecific skin eruption 10/21/2019   Raynaud's disease 01/12/2019   Osteoarthritis 11/12/2018   Cough 10/27/2017   Pain in right hand 09/02/2017   Chronic sinusitis 08/18/2017   Wheezing 08/18/2017   Psychophysiologic insomnia 01/06/2017   Pain in left elbow 01/02/2016   Dyspnea 09/11/2015   Abnormal weight loss 08/17/2015   Alopecia 08/17/2015   Fatigue 08/17/2015   Joint pain 08/17/2015   Breast cancer of upper-outer quadrant of left female breast ([place]) 06/28/2015   Nontoxic single thyroid nodule 12/28/2014   Right hip pain 09/19/2014   Right knee pain 09/19/2014   Dyspnea on exertion 08/08/2014   Heart palpitations 08/08/2014   Acquired absence of breast and absent nipple 03/09/2014   Myopathy 02/01/2014   Localized swelling, mass and lump, trunk 11/22/2013   Pre-operative cardiovascular examination 09/09/2013   Other chronic pulmonary heart diseases 05/27/2013   SIRS (systemic inflammatory response syndrome) ([place]) 04/14/2013   Anemia 04/14/2013   Dehydration 04/14/2013   Fever 04/14/2013   IBS (irritable bowel syndrome)    ADD (attention deficit disorder)    Lyme disease    Asthma    Hypothyroidism    Contact lens/glasses fitting    Hyperlipidemia 11/16/2012   Headache 02/05/2012   Benign neoplasm of colon 09/17/2011   Migraine without aura, not refractory 09/17/2011   Malignant neoplasm of cervix uteri ([place]) 09/06/2009   Personal history of other specified conditions 09/06/2009   Thrombophlebitis of superficial veins of lower extremity 09/06/2009   Varicose veins of other specified sites 09/06/2009    Past Surgical History:  Procedure Laterality Date   ABDOMINAL HYSTERECTOMY     ABDOMINOPLASTY  03/28/2004   AXILLARY SENTINEL NODE BIOPSY  12/31/2012   Procedure: AXILLARY  SENTINEL NODE BIOPSY;  Surgeon: Rolm Bookbinder, MD;  Location: [facility];  Service: General;  Laterality: Bilateral;  bilateral sentinel node   BRAIN SURGERY     removal benign frontal lobe cyst in [place] Bilateral ~ 2008   BREAST IMPLANT EXCHANGE Bilateral 12/11/2017   Procedure: BILATERAL IMPLANT EXCHANGE;  Surgeon: Wallace Going, DO;  Location: [facility];  Service: Plastics;  Laterality: Bilateral;   BREAST RECONSTRUCTION WITH PLACEMENT OF TISSUE EXPANDER AND FLEX HD (ACELLULAR HYDRATED DERMIS)  12/31/2012   Procedure: BREAST RECONSTRUCTION WITH PLACEMENT OF TISSUE EXPANDER AND FLEX HD (ACELLULAR HYDRATED DERMIS);  Surgeon: Theodoro Kos, DO;  Location: [facility];  Service: Plastics;  Laterality: Bilateral;  bilateral immediate breast reconstruction with expanders and flex hd    CAPSULOTOMY Left 09/01/2014   Procedure: CAPSULOTOMY OF LEFT BREAST WITH LIPOFILLING FOR SYMMETRY;  Surgeon: Wayland Denis, DO;  Location: [facility];  Service: Plastics;  Laterality: Left;   CERVICAL CERCLAGE     DIAGNOSTIC LAPAROSCOPY  05/12/2003   right partial salpingectomy; lysis paraovarian adhesions left   DILATION AND CURETTAGE OF UTERUS  2005   ENDOMETRIAL FULGURATION  05/12/2003   FACIAL COSMETIC SURGERY  ~ 2010   FOOT SURGERY Bilateral 07/2012   GYNECOLOGIC CRYOSURGERY  1998   INCISION AND DRAINAGE OF WOUND Left 04/28/2013   Procedure: IRRIGATION AND DEBRIDEMENT LEFT BREAST WOUND, POSSIBLE CLOSURE OF WOUND WITH DRAIN PLACEMENT;  Surgeon: Wayland Denis, DO;  Location: [facility];  Service: Plastics;  Laterality: Left;   LATISSIMUS FLAP TO BREAST Left 09/29/2013   Procedure: LATISSIMUS FLAP TO BREAST WITH TISSUE EXPANDER PLACEMENT FOR LEFT BREAST RECONSTRUCTION;  Surgeon: Wayland Denis, DO;  Location[facility];  Service: Plastics;  Laterality: Left;   LIPECTOMY Bilateral 03/28/2004   hip   LIPOSUCTION  Bilateral 03/09/2014   Procedure: LIPOSUCTION ABDOMEN W/ LIPOFILLING LATERAL BILATERAL BREAST;  Surgeon: Wayland Denis, DO;  Location: [facility];  Service: Plastics;  Laterality: Bilateral;  LIPOSUCTION NECK   LIPOSUCTION WITH LIPOFILLING Bilateral 09/01/2014   Procedure: LIPOSUCTION WITH LIPOFILLING;  Surgeon: Wayland Denis, DO;  Location: [facility];  Service: Plastics;  Laterality: Bilateral;   LYSIS OF ADHESION  03/28/2004   pelvic   MASTECTOMY     PORT-A-CATH REMOVAL Right 03/09/2014   Procedure: REMOVAL PORT-A-CATH;  Surgeon: Wayland Denis, DO;  Location: [facility];  Service: Plastics;  Laterality: Right;   PORTACATH PLACEMENT  01/27/2013   Procedure: INSERTION PORT-A-CATH;  Surgeon: Emelia Loron, MD;  Location: [facility];  Service: General;  Laterality: Right;   RADIOFREQUENCY ABLATION NERVES  05/12/2003   uterosacral nerve ablation   REMOVAL OF BILATERAL TISSUE EXPANDERS WITH PLACEMENT OF BILATERAL BREAST IMPLANTS Bilateral 03/09/2014   Procedure: REMOVAL OF BILATERAL TISSUE EXPANDERS WITH PLACEMENT OF BILATERAL BREAST IMPLANTS;  Surgeon: Wayland Denis, DO;  Location: [facility];  Service: Plastics;  Laterality: Bilateral;   TEE WITHOUT CARDIOVERSION N/A 06/04/2013   Procedure: TRANSESOPHAGEAL ECHOCARDIOGRAM (TEE);  Surgeon: Dolores Patty, MD;  Location: [facility];  Service: Cardiovascular;  Laterality: N/A;   TISSUE EXPANDER PLACEMENT Left 06/16/2013   Procedure: LEFT BREAST TISSUE EXPANDER REMOVAL;  Surgeon: Wayland Denis, DO;  Location: [facility] ;  Service: Plastics;  Laterality: Left;   TISSUE EXPANDER PLACEMENT Left 09/29/2013   Procedure: TISSUE EXPANDER;  Surgeon: Wayland Denis, DO;  Location: [facility];  Service: Plastics;  Laterality: Left;   TOTAL ABDOMINAL HYSTERECTOMY W/ BILATERAL SALPINGOOPHORECTOMY  03/28/2004   TOTAL MASTECTOMY  12/31/2012   Procedure: TOTAL MASTECTOMY;  Surgeon:  Emelia Loron, MD;  Location: [facility];  Service: General;  Laterality: Bilateral;  bilateral total mastectomies    Current Outpatient Medications  Medication Sig Dispense Refill   B-Complex TABS      Biotin 1 MG CAPS Take 1 capsule by mouth daily.     COVID-19 mRNA bivalent vaccine, Pfizer, (PFIZER COVID-19 VAC BIVALENT) injection Inject into the muscle. 0.3 mL 0   dextroamphetamine (DEXTROSTAT) 10 MG tablet Take 2 tablets (20 mg total) by mouth 2 (two) times daily. 360 tablet 0  EPINEPHrine 0.3 mg/0.3 mL IJ SOAJ injection Inject 0.3 mg (1 pen) into the muscle as needed. 2 each 1   eszopiclone (LUNESTA) 1 MG TABS tablet Take 1 tablet (1 mg total) by mouth at bedtime as needed for sleep. Take immediately before bedtime 30 tablet 0   fluconazole (DIFLUCAN) 150 MG tablet Take 1 tablet by mouth now and repeat in 3 & 6 days 3 tablet 4   fluocinonide gel (LIDEX) 0.05 % Apply topically.     hyoscyamine (ANASPAZ) 0.125 MG TBDP disintergrating tablet Place 0.125 mg under the tongue as needed.      indomethacin (INDOCIN) 50 MG capsule Take 1 capsule (50 mg total) by mouth 3 (three) times daily with meals as needed. 90 capsule 1   loratadine (CLARITIN) 10 MG tablet Take 1 tab po QD     Multiple Vitamin (MULTIVITAMIN WITH MINERALS) TABS Take 1 tablet by mouth daily.     nitrofurantoin, macrocrystal-monohydrate, (MACROBID) 100 MG capsule Take 1 capsule (100 mg total) by mouth every 12 (twelve) hours with food for 5 days 10 capsule 0   rosuvastatin (CRESTOR) 10 MG tablet Take 1 tablet (10 mg total) by mouth daily. 90 tablet 3   SHINGRIX injection      SYNTHROID 100 MCG tablet Take 1 tablet (100 mcg total) by mouth every morning. 90 tablet 3   valACYclovir (VALTREX) 1000 MG tablet Take 1 tablet (1,000 mg total) by mouth daily. 90 tablet 4   vitamin E 600 UNIT capsule Take 800 Units by mouth daily.      albuterol (VENTOLIN HFA) 108 (90 Base) MCG/ACT inhaler INHALE 2 PUFFS BY MOUTH EVERY  6 HOURS AS NEEDED 18 g 3   albuterol (VENTOLIN HFA) 108 (90 Base) MCG/ACT inhaler Inhale 2 puffs into the lungs every 6 (six) hours as needed as directed 18 g 3   gabapentin (NEURONTIN) 100 MG capsule Take 1 capsule (100 mg total) by mouth 3 (three) times daily. 90 capsule 3   No current facility-administered medications for this visit.    Allergies as of 02/27/2022 - Review Complete 02/27/2022  Allergen Reaction Noted   Betadine [povidone iodine] Anaphylaxis 12/25/2012   Doxycycline Rash 06/19/2018   Iodine Anaphylaxis 03/04/2008   Shellfish-derived products Anaphylaxis 12/14/2012   Amoxicillin Rash 05/07/2021   Cleocin [clindamycin hcl] Rash 01/27/2013   Ginger Rash 08/25/2014   Penicillins Rash 03/04/2008   Sulfamethoxazole-trimethoprim Rash 03/04/2008    Vitals: BP (!) 147/90    Pulse 86    Ht $R'5\' 4"'Uh$  (1.626 m)    Wt 152 lb 9.6 oz (69.2 kg)    BMI 26.19 kg/m  Last Weight:  Wt Readings from Last 1 Encounters:  02/27/22 152 lb 9.6 oz (69.2 kg)   Last Height:   Ht Readings from Last 1 Encounters:  02/27/22 $RemoveB'5\' 4"'kWRCXNRF$  (1.626 m)     Physical exam: Exam: Gen: NAD, conversant, well nourised, well groomed                     CV: RRR, no MRG. No Carotid Bruits. No peripheral edema, warm, nontender Eyes: Conjunctivae clear without exudates or hemorrhage  Neuro: Detailed Neurologic Exam  Speech:    Speech is normal; fluent and spontaneous with normal comprehension.  Cognition:    The patient is oriented to person, place, and time;     recent and remote memory intact;     language fluent;     normal attention, concentration,     fund of  knowledge Cranial Nerves:    The pupils are equal, round, and reactive to light. The fundi are flat. Visual fields are full to finger confrontation. Extraocular movements are intact. Trigeminal sensation is intact and the muscles of mastication are normal. The face is symmetric. The palate elevates in the midline. Hearing intact. Voice is normal.  Shoulder shrug is normal. The tongue has normal motion without fasciculations.   Coordination:    Normal    Gait:    normal.   Motor Observation:    No asymmetry, no atrophy, and no involuntary movements noted. Tone:    Normal muscle tone.    Posture:    Posture is normal. normal erect    Strength:    Strength is V/V in the upper and lower limbs.      Sensation: intact to LT     Reflex Exam:  DTR's:    Deep tendon reflexes in the upper and lower extremities are normal bilaterally.   Toes:    The toes are downgoing bilaterally.   Clonus:    Clonus is absent.    Assessment/Plan:  66 y.o. female here as requested by Reynold Bowen, MD for stabbing headaches. PMHx raynauds, migraines, icepick headaches as a child (these are different).Patient is having 100s of stabs in the head right parieto-occipital area. Unusual. No autonomic symptoms. Has woken her up at night. CT of the head negative.   - Unclear diagnosos. Atypical occipital neuralgia? Hemicrania continua? No autonomic symptoms so SUNA/SUNCT unikely? Primary stabbing headache? We will try nerve blocks today. Prescribe Indomethacin and Gabapentin. If no relief consider MRI and MRA   Meds ordered this encounter  Medications   indomethacin (INDOCIN) 50 MG capsule    Sig: Take 1 capsule (50 mg total) by mouth 3 (three) times daily with meals as needed.    Dispense:  90 capsule    Refill:  1   All procedures a documented below were medically necessary, reasonable and appropriate based on the patient's history, medical diagnosis and physician opinion. Verbal informed consent was obtained from the patient, patient was informed of potential risk of procedure, including bruising, bleeding, hematoma formation, infection, muscle weakness, muscle pain, numbness, transient hypertension, transient hyperglycemia and transient insomnia among others. All areas injected were topically clean with isopropyl rubbing alcohol. Nonsterile  nonlatex gloves were worn during the procedure.  1. Greater occipital nerve block [phone]). The greater occipital nerve site was identified at the nuchal line medial to the occipital artery. Medication was injected into the right occipital nerve areas and suboccipital areas. Patient's condition is associated with inflammation of the greater occipital nerve and associated multiple groups. Injection was deemed medically necessary, reasonable and appropriate. Injection represents a separate and unique surgical service.  2. Lesser occipital nerve block [phone]). The lesser occipital nerve site was identified approximately 2 cm lateral to the greater occipital nerve. Occasion was injected into the right occipital nerve areas. Patient's condition is associated with inflammation of the lesser occipital nerve and associated muscle groups. Injection was deemed medically necessary, reasonable and appropriate. Injection represents a separate and unique surgical service.    Cc: Reynold Bowen, MD,  Reynold Bowen, MD  Sarina Ill, MD  [facility] [address] [place], [place] [address]  Phone [phone] Fax [phone]  I spent 60 minutes of face-to-face and non-face-to-face time with patient on the  1. Primary stabbing headache    diagnosis.  This included previsit chart review, lab review, study review, order entry, electronic  health record documentation, patient education on the different diagnostic and therapeutic options, counseling and coordination of care, risks and benefits of management, compliance, or risk factor reduction

## 2022-02-27 NOTE — Patient Instructions (Addendum)
Occipital Neuralgia Occipital neuralgia is a type of headache that causes brief episodes of very bad pain in the back of the head. Pain from occipital neuralgia may spread (radiate) to other parts of the head. These headaches may be caused by irritation of the nerves that leave the spinal cord high up in the neck, just below the base of the skull (occipital nerves). The occipital nerves transmit sensations from the back of the head, the top of the head, and the areas behind the ears. What are the causes? This condition can occur without any known cause (primary headache syndrome). In other cases, this condition is caused by pressure on or irritation of one of the two occipital nerves. Pressure and irritation may be due to: Muscle spasm in the neck. Neck injury. Wear and tear of the vertebrae in the neck (osteoarthritis). Disease of the disks that separate the vertebrae. Swollen blood vessels that put pressure on the occipital nerves. Infections. Tumors. Diabetes. What are the signs or symptoms? This condition causes brief burning, stabbing, electric, shocking, or shooting pain in the back of the head that can radiate to the top of the head. It can happen on one side or both sides of the head. It can also cause: Pain behind the eye. Pain triggered by neck movement or hair brushing. Scalp tenderness. Aching in the back of the head between episodes of very bad pain. Pain that gets worse with exposure to bright lights. How is this diagnosed? Your health care provider may diagnose the condition based on a physical exam and your symptoms. Tests may be done, such as: Imaging studies of the brain and neck (cervical spine), such as an MRI or CT scan. These look for causes of pinched nerves. Applying pressure to the nerves in the neck to try to re-create the pain. Injection of numbing medicine into the occipital nerve areas to see if pain goes away (diagnostic nerve block). How is this  treated? Treatment for this condition may begin with simple measures, such as: Rest. Massage. Applying heat or cold to the area. Over-the-counter pain relievers. If these measures do not work, you may need other treatments, including: Medicines, such as: Prescription-strength anti-inflammatory medicines. Muscle relaxants. Anti-seizure medicines, which can relieve pain. Antidepressants, which can relieve pain. Injected medicines, such as medicines that numb the area (local anesthetic) and steroids. Pulsed radiofrequency ablation. This is when wires are implanted to deliver electrical impulses that block pain signals from the occipital nerve. Surgery to relieve nerve pressure. Physical therapy. Follow these instructions at home: Managing pain   Avoid any activities that cause pain. Rest when you have an attack of pain. Try gentle massage to relieve pain. Try a different pillow or sleeping position. If directed, apply heat to the affected area as often as told by your health care provider. Use the heat source that your health care provider recommends, such as a moist heat pack or a heating pad. Place a towel between your skin and the heat source. Leave the heat on for 20-30 minutes. Remove the heat if your skin turns bright red. This is especially important if you are unable to feel pain, heat, or cold. You have a greater risk of getting burned. If directed, put ice on the back of your head and neck area. To do this: Put ice in a plastic bag. Place a towel between your skin and the bag. Leave the ice on for 20 minutes, 2-3 times a day. Remove the ice if your  skin turns bright red. This is very important. If you cannot feel pain, heat, or cold, you have a greater risk of damage to the area. General instructions Take over-the-counter and prescription medicines only as told by your health care provider. Avoid things that make your symptoms worse, such as bright lights. Try to stay  active. Get regular exercise that does not cause pain. Ask your health care provider to suggest safe exercises for you. Work with a physical therapist to learn stretching exercises you can do at home. Practice good posture. Keep all follow-up visits. This is important. Contact a health care provider if: Your medicine is not working. You have new or worsening symptoms. Get help right away if: You have very bad head pain that does not go away. You have a sudden change in vision, balance, or speech. These symptoms may represent a serious problem that is an emergency. Do not wait to see if the symptoms will go away. Get medical help right away. Call your local emergency services (911 in the [place] Do not drive yourself to the hospital. Summary Occipital neuralgia is a type of headache that causes brief episodes of very bad pain in the back of the head. Pain from occipital neuralgia may spread (radiate) to other parts of the head. Treatment for this condition includes rest, massage, and medicines. This information is not intended to replace advice given to you by your health care provider. Make sure you discuss any questions you have with your health care provider. Document Revised: 10/08/2020 Document Reviewed: 10/08/2020 Elsevier Patient Education  [place].  Indomethacin Capsules What is this medication? INDOMETHACIN (in doe METH a sin) treats moderate to severe pain, inflammation, or arthritis. It works by decreasing inflammation. It belongs to a group of medications called NSAIDs. This medicine may be used for other purposes; ask your health care provider or pharmacist if you have questions. COMMON BRAND NAME(S): Indocin, TIVORBEX What should I tell my care team before I take this medication? They need to know if you have any of these conditions: Bleeding disorder Coronary artery bypass graft (CABG) within the past 2 weeks Dehydration Depression Heart attack Heart  disease Heart failure High blood pressure If you often drink alcohol Kidney disease Liver disease Lung or breathing disease (asthma) Parkinson's disease Receiving steroids like dexamethasone or prednisone Seizures Smoke tobacco cigarettes Stomach bleeding Stomach ulcers, other stomach or intestine problems Take medications that treat or prevent blood clots An unusual or allergic reaction to indomethacin, other medications, foods, dyes, or preservatives Pregnant or trying to get pregnant Breast-feeding How should I use this medication? Take this medication by mouth. Take it as directed on the prescription label at the same time every day. You can take it with or without food. If it upsets your stomach, take it with food. Keep taking it unless your care team tells you to stop. A special MedGuide will be given to you by the pharmacist with each prescription and refill. Be sure to read this information carefully each time. Talk to your care team about the use of this medication in children. While it may be prescribed for children as young as 29 for selected conditions, precautions do apply. Patients over 63 years of age may have a stronger reaction and need a smaller dose. Overdosage: If you think you have taken too much of this medicine contact a poison control center or emergency room at once. NOTE: This medicine is only for you. Do not share this medicine with  others. What if I miss a dose? If you miss a dose, take it as soon as you can. If it is almost time for your next dose, take only that dose. Do not take double or extra doses. What may interact with this medication? Do not take this medication with any of the following: Cidofovir Diflunisal Ketorolac Methotrexate Pemetrexed Triamterene This medication may also interact with the following: Alcohol Antacids Aspirin and aspirin-like medications Cyclosporine Digoxin Diuretics Lithium Medications for diabetes Medications  for high blood pressure Medications that affect platelets Medications that treat or prevent blood clots like warfarin NSAIDs, medications for pain and inflammation, like ibuprofen or naproxen Probenecid Steroid medications like prednisone or cortisone This list may not describe all possible interactions. Give your health care provider a list of all the medicines, herbs, non-prescription drugs, or dietary supplements you use. Also tell them if you smoke, drink alcohol, or use illegal drugs. Some items may interact with your medicine. What should I watch for while using this medication? Visit your care team for regular checks on your progress. Tell your care team if your symptoms do not start to get better or if they get worse. Do not take other medications that contain aspirin, ibuprofen, or naproxen with this medication. Side effects such as stomach upset, nausea, or ulcers may be more likely to occur. Many non-prescription medications contain aspirin, ibuprofen, or naproxen. Always read labels carefully. This medication can cause serious ulcers and bleeding in the stomach. It can happen with no warning. Smoking, drinking alcohol, older age, and poor health can also increase risks. Call your care team right away if you have stomach pain or blood in your vomit or stool. This medication does not prevent a heart attack or stroke. This medication may increase the chance of a heart attack or stroke. The chance may increase the longer you use this medication or if you have heart disease. If you take aspirin to prevent a heart attack or stroke, talk to your care team about using this medication. Alcohol may interfere with the effect of this medication. Avoid alcoholic drinks. This medication may cause serious skin reactions. They can happen weeks to months after starting the medication. Contact your care team right away if you notice fevers or flu-like symptoms with a rash. The rash may be red or purple and  then turn into blisters or peeling of the skin. Or, you might notice a red rash with swelling of the face, lips or lymph nodes in your neck or under your arms. Talk to your care team if you are pregnant before taking this medication. Taking this medication between weeks 20 and 30 of pregnancy may harm your unborn baby. Your care team will monitor you closely if you need to take it. After 30 weeks of pregnancy, do not take this medication. You may get drowsy or dizzy. Do not drive, use machinery, or do anything that needs mental alertness until you know how this medication affects you. Do not stand up or sit up quickly, especially if you are an older patient. This reduces the risk of dizzy or fainting spells. Be careful brushing or flossing your teeth or using a toothpick because you may get an infection or bleed more easily. If you have any dental work done, tell your dentist you are receiving this medication. This medication may make it more difficult to get pregnant. Talk to your care team if you are concerned about your fertility. What side effects may I notice from receiving  this medication? Side effects that you should report to your care team as soon as possible: Allergic reactions--skin rash, itching, hives, swelling of the face, lips, tongue, or throat Bleeding--bloody or black, tar-like stools, vomiting blood or brown material that looks like coffee grounds, red or dark brown urine, small red or purple spots on skin, unusual bruising or bleeding Heart attack--pain or tightness in the chest, shoulders, arms, or jaw, nausea, shortness of breath, cold or clammy skin, feeling faint or lightheaded Heart failure--shortness of breath, swelling of the ankles, feet, or hands, sudden weight gain, unusual weakness or fatigue Increase in blood pressure Kidney injury--decrease in the amount of urine, swelling of the ankles, hands, or feet Liver injury--right upper belly pain, loss of appetite, nausea,  light-colored stool, dark yellow or brown urine, yellowing skin or eyes, unusual weakness or fatigue Rash, fever, and swollen lymph nodes Redness, blistering, peeling, or loosening of the skin, including inside the mouth Stroke--sudden numbness or weakness of the face, arm, or leg, trouble speaking, confusion, trouble walking, loss of balance or coordination, dizziness, severe headache, change in vision Side effects that usually do not require medical attention (report to your care team if they continue or are bothersome): Headache Loss of appetite Nausea Upset stomach This list may not describe all possible side effects. Call your doctor for medical advice about side effects. You may report side effects to FDA at 1-800-FDA-1088. Where should I keep my medication? Keep out of the reach of children and pets. Store at room temperature between 15 and 30 degrees C (59 and 86 degrees F). Protect from light and moisture. Keep the container tightly closed. Get rid of any unused medication after the expiration date. To get rid of medications that are no longer needed or have expired: Take the medication to a medication take-back program. Check with your pharmacy or law enforcement to find a location. If you cannot return the medication, check the label or package insert to see if the medication should be thrown out in the garbage or flushed down the toilet. If you are not sure, ask your care team. If it is safe to put it in the trash, empty the medication out of the container. Mix the medication with cat litter, dirt, coffee grounds, or other unwanted substance. Seal the mixture in a bag or container. Put it in the trash. NOTE: This sheet is a summary. It may not cover all possible information. If you have questions about this medicine, talk to your doctor, pharmacist, or health care provider.  2022 Elsevier/Gold Standard (2021-08-09 00:00:00)

## 2022-02-27 NOTE — Telephone Encounter (Signed)
I spoke with patient and reviewed echo and CT results with her. Patient will start Rosuvastatin and stop by office for fasting lab work on May 30, 2022.  Prescription sent to [facility].  ?

## 2022-02-27 NOTE — Progress Notes (Signed)
Nerve block w/o steroid: ?Pt signed consent yes  ? ?0.5% Bupivocaine 6 mL ?LOT: [id number] ?EXP: 12/2024 ?[place]: 29924-268-34  ? ?

## 2022-02-27 NOTE — Telephone Encounter (Signed)
-----   Message from Jettie Booze, MD sent at 02/27/2022 10:42 AM EST ----- ?Calcium score 57, in the 75th percentile. Recommend statin.  Rosuvastatin 10 mg daily.  LDL target < 100. Liver and lipids in 3 months ?

## 2022-02-27 NOTE — Telephone Encounter (Signed)
-----   Message from Jettie Booze, MD sent at 02/27/2022 10:41 AM EST ----- ?Normal LV/RV function.  Normal miral and aortic valve function. TR noted but right atrial size normal. No evidence of volume overload.  ?

## 2022-02-28 ENCOUNTER — Other Ambulatory Visit (HOSPITAL_COMMUNITY): Payer: Self-pay

## 2022-02-28 ENCOUNTER — Other Ambulatory Visit: Payer: Self-pay | Admitting: Neurology

## 2022-02-28 ENCOUNTER — Encounter: Payer: Self-pay | Admitting: Neurology

## 2022-02-28 MED ORDER — GABAPENTIN 100 MG PO CAPS
100.0000 mg | ORAL_CAPSULE | Freq: Three times a day (TID) | ORAL | 3 refills | Status: DC
  Filled 2022-02-28 (×2): qty 90, 30d supply, fill #0

## 2022-03-01 ENCOUNTER — Encounter: Payer: Self-pay | Admitting: Neurology

## 2022-03-01 ENCOUNTER — Other Ambulatory Visit (HOSPITAL_COMMUNITY): Payer: Self-pay

## 2022-03-01 MED ORDER — ALBUTEROL SULFATE HFA 108 (90 BASE) MCG/ACT IN AERS
2.0000 | INHALATION_SPRAY | Freq: Four times a day (QID) | RESPIRATORY_TRACT | 3 refills | Status: DC | PRN
  Filled 2022-03-01: qty 18, 25d supply, fill #0
  Filled 2022-04-15: qty 18, 25d supply, fill #1
  Filled 2022-06-03: qty 18, 25d supply, fill #2
  Filled 2022-09-19: qty 18, 25d supply, fill #3
  Filled 2022-12-18: qty 6.7, 25d supply, fill #3
  Filled 2023-01-29: qty 6.7, 25d supply, fill #4

## 2022-03-04 ENCOUNTER — Telehealth: Payer: Self-pay | Admitting: Neurology

## 2022-03-04 ENCOUNTER — Encounter: Payer: Self-pay | Admitting: Neurology

## 2022-03-04 NOTE — Telephone Encounter (Signed)
See telephone note.

## 2022-03-04 NOTE — Telephone Encounter (Signed)
EPIC crashed while I was responding about [employer]. We can order an MRI of the brain and MRA but it takes time for [place] to get that done outpatient. If she is in severe pain and is having 700 stabs daily the quickest way to get an MRI is through the ED unfortunately. Has she tried the indomethacin 3x a day? If this is an indomethacin responsive headache would like her to try taking it 3x a day.  ?

## 2022-03-04 NOTE — Telephone Encounter (Signed)
Spoke to patient wants the MRI outpatient. Is aware of timetable of Two weeks .Did inform patient if in pain can go to ER to get MRI quicker. Pt states that she has a friend at [facility]  patient wants referral there to be seen quicker  Informed patient of Medication change per Dr Jaynee Eagles . Pt states she has not been taking medication 3x daily . Pt verbalized understanding .Thanked me for calling  ?

## 2022-03-05 ENCOUNTER — Encounter: Payer: Self-pay | Admitting: Neurology

## 2022-03-05 ENCOUNTER — Other Ambulatory Visit: Payer: Self-pay | Admitting: Neurology

## 2022-03-05 ENCOUNTER — Telehealth: Payer: Self-pay | Admitting: Neurology

## 2022-03-05 MED ORDER — PREDNISONE 50 MG PO TABS
ORAL_TABLET | ORAL | 0 refills | Status: DC
  Filled 2022-03-05: qty 3, 1d supply, fill #0

## 2022-03-05 NOTE — Telephone Encounter (Signed)
[facility] no auth req- faxed to [facility], they will reach out to the pt to schedule.  ?

## 2022-03-05 NOTE — Telephone Encounter (Signed)
Pt did see mychart email and did respond back.   ?

## 2022-03-05 NOTE — Addendum Note (Signed)
Addended by: Sarina Ill B on: 03/05/2022 10:16 AM ? ? Modules accepted: Orders ? ?

## 2022-03-06 ENCOUNTER — Other Ambulatory Visit (HOSPITAL_COMMUNITY): Payer: Self-pay

## 2022-03-06 ENCOUNTER — Encounter: Payer: Self-pay | Admitting: Neurology

## 2022-03-06 ENCOUNTER — Other Ambulatory Visit: Payer: Self-pay | Admitting: Neurology

## 2022-03-06 DIAGNOSIS — G4451 Hemicrania continua: Secondary | ICD-10-CM

## 2022-03-07 ENCOUNTER — Other Ambulatory Visit (HOSPITAL_COMMUNITY): Payer: Self-pay

## 2022-03-07 MED ORDER — DEXTROAMPHETAMINE SULFATE 10 MG PO TABS
20.0000 mg | ORAL_TABLET | Freq: Two times a day (BID) | ORAL | 0 refills | Status: DC
  Filled 2022-03-07: qty 300, 75d supply, fill #0
  Filled 2022-03-11: qty 60, 15d supply, fill #0

## 2022-03-08 ENCOUNTER — Other Ambulatory Visit (HOSPITAL_COMMUNITY): Payer: Self-pay

## 2022-03-08 DIAGNOSIS — R519 Headache, unspecified: Secondary | ICD-10-CM | POA: Diagnosis not present

## 2022-03-08 DIAGNOSIS — G4484 Primary exertional headache: Secondary | ICD-10-CM | POA: Diagnosis not present

## 2022-03-08 DIAGNOSIS — R51 Headache with orthostatic component, not elsewhere classified: Secondary | ICD-10-CM | POA: Diagnosis not present

## 2022-03-08 DIAGNOSIS — H93A9 Pulsatile tinnitus, unspecified ear: Secondary | ICD-10-CM | POA: Diagnosis not present

## 2022-03-08 DIAGNOSIS — H539 Unspecified visual disturbance: Secondary | ICD-10-CM | POA: Diagnosis not present

## 2022-03-08 DIAGNOSIS — R9082 White matter disease, unspecified: Secondary | ICD-10-CM | POA: Diagnosis not present

## 2022-03-11 ENCOUNTER — Other Ambulatory Visit (HOSPITAL_COMMUNITY): Payer: Self-pay

## 2022-03-11 NOTE — Telephone Encounter (Signed)
Jane Gutierrez with [facility] sent me a message.  ? ?"NOTES STATE APPT CANCELLED DUE TO PATIENT ALLERGIC TO CONTRAST AND PATIENT WAS SEEN 3/17 AT [facility]. ?WE WILL SEND IMAGES ELECTRONICALLY." ?

## 2022-03-12 ENCOUNTER — Ambulatory Visit (INDEPENDENT_AMBULATORY_CARE_PROVIDER_SITE_OTHER): Payer: Self-pay | Admitting: Plastic Surgery

## 2022-03-12 ENCOUNTER — Encounter: Payer: Self-pay | Admitting: Plastic Surgery

## 2022-03-12 ENCOUNTER — Other Ambulatory Visit: Payer: Self-pay

## 2022-03-12 DIAGNOSIS — Z719 Counseling, unspecified: Secondary | ICD-10-CM

## 2022-03-12 NOTE — Progress Notes (Signed)
Botulinum Toxin Procedure Note ? ?Procedure: Cosmetic botulinum toxin  ? ?Pre-operative Diagnosis: Dynamic rhytides  ? ?Post-operative Diagnosis: Same ? ?Complications:  None ? ?Brief history: The patient desires botulinum toxin injection of her forehead. I discussed with the patient this proposed procedure of botulinum toxin injections, which is customized depending on the particular needs of the patient. It is performed on facial rhytids as a temporary correction. The alternatives were discussed with the patient. The risks were addressed including bleeding, scarring, infection, damage to deeper structures, asymmetry, and chronic pain, which may occur infrequently after a procedure. The individual's choice to undergo a surgical procedure is based on the comparison of risks to potential benefits. Other risks include unsatisfactory results, brow ptosis, eyelid ptosis, allergic reaction, temporary paralysis, which should go away with time, bruising, blurring disturbances and delayed healing. Botulinum toxin injections do not arrest the aging process or produce permanent tightening of the eyelid.  Operative intervention maybe necessary to maintain the results of a blepharoplasty or botulinum toxin. The patient understands and wishes to proceed. ? ?Procedure: The area was prepped with alcohol and dried with a clean gauze. Using a clean technique, the botulinum toxin was diluted with 1.25 cc of preservative-free normal saline which was slowly injected with an 18 gauge needle in a tuberculin syringes.  A 32 gauge needles were then used to inject the botulinum toxin. This mixture allow for an aliquot of 4 units per 0.1 cc in each injection site.   ? ?Subsequently the mixture was injected in the glabellar and forehead area with preservation of the temporal branch to the lateral eyebrow as well as into each lateral canthal area beginning from the lateral orbital rim medial to the zygomaticus major in 3 separate areas. A  total of 30 Units of botulinum toxin was used. The forehead and glabellar area was injected with care to inject intramuscular only while holding pressure on the supratrochlear vessels in each area during each injection on either side of the medial corrugators. The injection proceeded vertically superiorly to the medial 2/3 of the frontalis muscle and superior 2/3 of the lateral frontalis, again with preservation of the frontal branch.  No complications were noted. Light pressure was held for 5 minutes. She was instructed explicitly in post-operative care. ? ?Botox ?LOT:  [id number] ?EXP:  3/25 ?

## 2022-03-12 NOTE — Addendum Note (Signed)
Addended by: Threasa Beards K on: 03/12/2022 12:47 PM ? ? Modules accepted: Orders ? ?

## 2022-03-20 ENCOUNTER — Encounter: Payer: Self-pay | Admitting: Neurology

## 2022-03-21 ENCOUNTER — Encounter: Payer: Self-pay | Admitting: *Deleted

## 2022-03-21 NOTE — Telephone Encounter (Signed)
Results given to Dr Jaynee Eagles to review.  ? ?03/08/2022 MRI Brain Impression: ?No acute intracranial abnormality ?Mild age-related changes ?No worrisome enhancement ? ?Normal for age per Dr Jaynee Eagles.  ? ?03/08/2022 MRA head Impression: ? ?Unremarkable MRA head  ? ?Imaging read by Dr Eddie Dibbles. Results sent to medical records to be scanned into chart. ?

## 2022-03-25 ENCOUNTER — Other Ambulatory Visit (HOSPITAL_COMMUNITY): Payer: Self-pay

## 2022-03-25 DIAGNOSIS — N950 Postmenopausal bleeding: Secondary | ICD-10-CM | POA: Diagnosis not present

## 2022-03-25 MED ORDER — WEGOVY 0.25 MG/0.5ML ~~LOC~~ SOAJ
0.2500 mg | SUBCUTANEOUS | 0 refills | Status: DC
  Filled 2022-03-25: qty 0.5, 7d supply, fill #0

## 2022-03-25 MED ORDER — WEGOVY 0.25 MG/0.5ML ~~LOC~~ SOAJ
0.2500 mg | SUBCUTANEOUS | 0 refills | Status: DC
  Filled 2022-03-25 – 2022-03-28 (×2): qty 2, 28d supply, fill #0

## 2022-03-26 ENCOUNTER — Other Ambulatory Visit (HOSPITAL_COMMUNITY): Payer: Self-pay

## 2022-03-28 ENCOUNTER — Other Ambulatory Visit (HOSPITAL_COMMUNITY): Payer: Self-pay

## 2022-04-12 ENCOUNTER — Encounter: Payer: Self-pay | Admitting: Plastic Surgery

## 2022-04-12 ENCOUNTER — Ambulatory Visit (INDEPENDENT_AMBULATORY_CARE_PROVIDER_SITE_OTHER): Payer: Self-pay | Admitting: Plastic Surgery

## 2022-04-12 DIAGNOSIS — Z719 Counseling, unspecified: Secondary | ICD-10-CM

## 2022-04-12 NOTE — Progress Notes (Signed)
Filler Injection Procedure Note ? ?Procedure:  Filler administration ? ?Pre-operative Diagnosis: volume loss of lips  ? ?Post-operative Diagnosis: Same ? ?Complications:  None ? ?Brief history: The patient desires injection with fillers in her face. I discussed with the patient this proposed procedure of filler injections, which is customized depending on the particular needs of the patient. It is performed on facial volume loss as a temporary correction. The alternatives were discussed with the patient. The risks were addressed including bleeding, scarring, infection, damage to deeper structures, asymmetry, and chronic pain, which may occur infrequently after a procedure. The individual's choice to undergo a surgical procedure is based on the comparison of risks to potential benefits. Other risks include unsatisfactory results, allergic reaction, which should go away with time, bruising and delayed healing. Fillers do not arrest the aging process or produce permanent tightening.  Operative intervention maybe necessary to maintain the results. The patient understands and wishes to proceed. An informed consent was signed and informational brochures given to her prior to the procedure. ? ?Procedure: The area was prepped with chlorhexidine and dried with a clean gauze. Using a clean technique, a 30 gauge needle was then used to inject the filler into the upper and lower lips. This was done with one syringe.  No complications were noted. Light pressure was held for 5 minutes. She was instructed explicitly in post-operative care. ? ?Restylane Kysse ?LOT: [id number] ?EXP: 2023-01-22 ? ? ?

## 2022-04-12 NOTE — Addendum Note (Signed)
Addended by: Tresa Moore on: 04/12/2022 03:28 PM ? ? Modules accepted: Orders ? ?

## 2022-04-15 ENCOUNTER — Other Ambulatory Visit: Payer: Self-pay | Admitting: Adult Health

## 2022-04-15 ENCOUNTER — Other Ambulatory Visit (HOSPITAL_COMMUNITY): Payer: Self-pay

## 2022-04-15 DIAGNOSIS — G4720 Circadian rhythm sleep disorder, unspecified type: Secondary | ICD-10-CM

## 2022-04-15 MED ORDER — ESZOPICLONE 1 MG PO TABS
1.0000 mg | ORAL_TABLET | Freq: Every evening | ORAL | 0 refills | Status: DC | PRN
  Filled 2022-04-15: qty 30, 30d supply, fill #0

## 2022-05-01 ENCOUNTER — Other Ambulatory Visit (HOSPITAL_COMMUNITY): Payer: Self-pay

## 2022-05-06 ENCOUNTER — Encounter: Payer: Self-pay | Admitting: Interventional Cardiology

## 2022-05-22 ENCOUNTER — Other Ambulatory Visit (HOSPITAL_COMMUNITY): Payer: Self-pay

## 2022-05-22 ENCOUNTER — Other Ambulatory Visit: Payer: Self-pay | Admitting: Adult Health

## 2022-05-22 DIAGNOSIS — G4720 Circadian rhythm sleep disorder, unspecified type: Secondary | ICD-10-CM

## 2022-05-22 MED ORDER — ESZOPICLONE 1 MG PO TABS
1.0000 mg | ORAL_TABLET | Freq: Every evening | ORAL | 0 refills | Status: DC | PRN
  Filled 2022-05-22: qty 30, 30d supply, fill #0

## 2022-05-28 ENCOUNTER — Ambulatory Visit (INDEPENDENT_AMBULATORY_CARE_PROVIDER_SITE_OTHER): Payer: 59 | Admitting: Plastic Surgery

## 2022-05-28 ENCOUNTER — Encounter: Payer: Self-pay | Admitting: Plastic Surgery

## 2022-05-28 ENCOUNTER — Ambulatory Visit (INDEPENDENT_AMBULATORY_CARE_PROVIDER_SITE_OTHER): Payer: Self-pay | Admitting: Plastic Surgery

## 2022-05-28 DIAGNOSIS — N644 Mastodynia: Secondary | ICD-10-CM | POA: Insufficient documentation

## 2022-05-28 DIAGNOSIS — C50412 Malignant neoplasm of upper-outer quadrant of left female breast: Secondary | ICD-10-CM | POA: Diagnosis not present

## 2022-05-28 DIAGNOSIS — Z719 Counseling, unspecified: Secondary | ICD-10-CM

## 2022-05-28 DIAGNOSIS — Z170 Estrogen receptor positive status [ER+]: Secondary | ICD-10-CM | POA: Diagnosis not present

## 2022-05-28 DIAGNOSIS — Z9013 Acquired absence of bilateral breasts and nipples: Secondary | ICD-10-CM

## 2022-05-28 NOTE — Progress Notes (Signed)
   Subjective:    Patient ID: Jane Gutierrez, female    DOB: 1956/09/04, 66 y.o.   MRN: [id number]  The patient is a 66 yrs old female here for evaluation of her left breast.  She underwent treatment for breast cancer with bilateral mastectomies and reconstruction with left latissimus / implant and right implant 2015.  She had implant exchange in 2019 to smooth gel implants. She has done well but noticed some changes in the left breast.  She has been trying to decrease her weight.  There is some rippling and mild pain/discomfort in the left breast.  I don't feel any concerning areas.    Review of Systems  Constitutional: Negative.   Eyes: Negative.   Respiratory: Negative.    Cardiovascular: Negative.   Gastrointestinal: Negative.   Endocrine: Negative.   Genitourinary: Negative.   Musculoskeletal: Negative.       Objective:   Physical Exam Constitutional:      Appearance: Normal appearance.  Cardiovascular:     Rate and Rhythm: Normal rate.     Pulses: Normal pulses.  Pulmonary:     Effort: Pulmonary effort is normal.  Skin:    Capillary Refill: Capillary refill takes less than 2 seconds.  Neurological:     Mental Status: She is alert.  Psychiatric:        Mood and Affect: Mood normal.        Behavior: Behavior normal.        Thought Content: Thought content normal.        Judgment: Judgment normal.       Assessment & Plan:     ICD-10-CM   1. Malignant neoplasm of upper-outer quadrant of left breast in female, estrogen receptor positive ([place])  C50.412    Z17.0     2. Acquired absence of breast and absent nipple, bilateral  Z90.13     3. Breast pain  N64.4       It is reasonable to do an [place] to check the left breast and implant.  Order placed.

## 2022-05-28 NOTE — Progress Notes (Signed)
Preoperative Dx: hyperpigmentation of face  Postoperative Dx:  same  Procedure: laser to forehead and left arm   Anesthesia: none  Description of Procedure:  Risks and complications were explained to the patient. Consent was confirmed and signed. Eye protection was placed. Time out was called and all information was confirmed to be correct. The area  area was prepped with alcohol and wiped dry. The BBL laser was set at pigment setting and the forehead and left arm pigment areas were lasered. The patient tolerated the procedure well and there were no complications.

## 2022-05-30 ENCOUNTER — Other Ambulatory Visit: Payer: 59

## 2022-06-03 ENCOUNTER — Other Ambulatory Visit: Payer: Self-pay | Admitting: Adult Health

## 2022-06-03 ENCOUNTER — Other Ambulatory Visit (HOSPITAL_COMMUNITY): Payer: Self-pay

## 2022-06-03 ENCOUNTER — Other Ambulatory Visit: Payer: Self-pay | Admitting: Radiology

## 2022-06-03 DIAGNOSIS — G4720 Circadian rhythm sleep disorder, unspecified type: Secondary | ICD-10-CM

## 2022-06-04 ENCOUNTER — Other Ambulatory Visit (HOSPITAL_COMMUNITY): Payer: Self-pay

## 2022-06-04 MED ORDER — DEXTROAMPHETAMINE SULFATE 10 MG PO TABS
20.0000 mg | ORAL_TABLET | Freq: Two times a day (BID) | ORAL | 0 refills | Status: DC
  Filled 2022-06-04 – 2022-06-07 (×2): qty 360, 90d supply, fill #0

## 2022-06-07 ENCOUNTER — Other Ambulatory Visit (HOSPITAL_COMMUNITY): Payer: Self-pay

## 2022-06-07 MED ORDER — ESZOPICLONE 1 MG PO TABS
1.0000 mg | ORAL_TABLET | Freq: Every evening | ORAL | 0 refills | Status: DC | PRN
  Filled 2022-06-07 – 2022-06-19 (×3): qty 30, 30d supply, fill #0

## 2022-06-18 ENCOUNTER — Other Ambulatory Visit (HOSPITAL_COMMUNITY): Payer: Self-pay

## 2022-06-19 ENCOUNTER — Other Ambulatory Visit (HOSPITAL_COMMUNITY): Payer: Self-pay

## 2022-06-27 ENCOUNTER — Ambulatory Visit
Admission: RE | Admit: 2022-06-27 | Discharge: 2022-06-27 | Disposition: A | Discharge status: Home / Self Care | Payer: 59 | Source: Ambulatory Visit | Attending: Plastic Surgery | Admitting: Plastic Surgery

## 2022-06-27 DIAGNOSIS — Z9013 Acquired absence of bilateral breasts and nipples: Secondary | ICD-10-CM

## 2022-06-27 DIAGNOSIS — N644 Mastodynia: Secondary | ICD-10-CM

## 2022-06-27 DIAGNOSIS — C50412 Malignant neoplasm of upper-outer quadrant of left female breast: Secondary | ICD-10-CM

## 2022-06-28 ENCOUNTER — Other Ambulatory Visit: Payer: Self-pay | Admitting: Plastic Surgery

## 2022-06-28 DIAGNOSIS — Z853 Personal history of malignant neoplasm of breast: Secondary | ICD-10-CM

## 2022-07-01 ENCOUNTER — Telehealth: Payer: Self-pay | Admitting: *Deleted

## 2022-07-01 NOTE — Telephone Encounter (Signed)
Call placed to pt to notify that MRI has been ordered, location [facility] and they should call her to schedule.Verbalized understanding. Stated she would contact them if she does not hear from them this week.

## 2022-07-03 ENCOUNTER — Other Ambulatory Visit (HOSPITAL_COMMUNITY): Payer: Self-pay

## 2022-07-03 ENCOUNTER — Other Ambulatory Visit: Payer: Self-pay | Admitting: Adult Health

## 2022-07-03 DIAGNOSIS — G4720 Circadian rhythm sleep disorder, unspecified type: Secondary | ICD-10-CM

## 2022-07-03 MED ORDER — ESZOPICLONE 1 MG PO TABS
1.0000 mg | ORAL_TABLET | Freq: Every evening | ORAL | 0 refills | Status: DC | PRN
  Filled 2022-07-03 – 2022-07-21 (×2): qty 30, 30d supply, fill #0

## 2022-07-09 ENCOUNTER — Other Ambulatory Visit (HOSPITAL_COMMUNITY): Payer: Self-pay

## 2022-07-10 ENCOUNTER — Other Ambulatory Visit (HOSPITAL_COMMUNITY): Payer: Self-pay

## 2022-07-10 MED ORDER — VALACYCLOVIR HCL 500 MG PO TABS
500.0000 mg | ORAL_TABLET | Freq: Two times a day (BID) | ORAL | 3 refills | Status: DC
  Filled 2022-07-10: qty 30, 15d supply, fill #0
  Filled 2022-07-21: qty 30, 15d supply, fill #1
  Filled 2022-08-05: qty 30, 15d supply, fill #2
  Filled 2022-09-19: qty 30, 15d supply, fill #3

## 2022-07-11 ENCOUNTER — Other Ambulatory Visit (HOSPITAL_BASED_OUTPATIENT_CLINIC_OR_DEPARTMENT_OTHER): Payer: Self-pay

## 2022-07-11 MED ORDER — PREVNAR 20 0.5 ML IM SUSY
PREFILLED_SYRINGE | INTRAMUSCULAR | 0 refills | Status: DC
  Filled 2022-07-11: qty 0.5, 1d supply, fill #0

## 2022-07-15 ENCOUNTER — Telehealth: Payer: Self-pay | Admitting: Adult Health

## 2022-07-15 NOTE — Telephone Encounter (Signed)
Rescheduled appointment per provider admin time. Patient is aware of the changes made to her upcoming appointment. 

## 2022-07-22 ENCOUNTER — Other Ambulatory Visit (HOSPITAL_COMMUNITY): Payer: Self-pay

## 2022-07-25 ENCOUNTER — Other Ambulatory Visit (HOSPITAL_COMMUNITY): Payer: Self-pay

## 2022-07-25 ENCOUNTER — Telehealth: Payer: 59 | Admitting: Physician Assistant

## 2022-07-25 DIAGNOSIS — H1030 Unspecified acute conjunctivitis, unspecified eye: Secondary | ICD-10-CM

## 2022-07-25 MED ORDER — OFLOXACIN 0.3 % OP SOLN
1.0000 [drp] | Freq: Four times a day (QID) | OPHTHALMIC | 0 refills | Status: AC
  Filled 2022-07-25: qty 5, 25d supply, fill #0

## 2022-07-25 NOTE — Progress Notes (Signed)
I have spent 5 minutes in review of e-visit questionnaire, review and updating patient chart, medical decision making and response to patient.   Ewin Rehberg Cody Sherolyn Trettin, PA-C    

## 2022-07-25 NOTE — Progress Notes (Signed)

## 2022-08-05 ENCOUNTER — Other Ambulatory Visit: Payer: Self-pay | Admitting: Adult Health

## 2022-08-05 ENCOUNTER — Other Ambulatory Visit (HOSPITAL_COMMUNITY): Payer: Self-pay

## 2022-08-05 DIAGNOSIS — G4720 Circadian rhythm sleep disorder, unspecified type: Secondary | ICD-10-CM

## 2022-08-05 MED ORDER — ESZOPICLONE 1 MG PO TABS
1.0000 mg | ORAL_TABLET | Freq: Every evening | ORAL | 0 refills | Status: DC | PRN
  Filled 2022-08-05 – 2022-08-28 (×2): qty 30, 30d supply, fill #0

## 2022-08-06 ENCOUNTER — Other Ambulatory Visit: Payer: 59

## 2022-08-06 DIAGNOSIS — E785 Hyperlipidemia, unspecified: Secondary | ICD-10-CM

## 2022-08-06 LAB — HEPATIC FUNCTION PANEL
ALT: 22 IU/L (ref 0–32)
AST: 30 IU/L (ref 0–40)
Albumin: 4.2 g/dL (ref 3.9–4.9)
Alkaline Phosphatase: 57 IU/L (ref 44–121)
Bilirubin Total: 0.4 mg/dL (ref 0.0–1.2)
Bilirubin, Direct: 0.11 mg/dL (ref 0.00–0.40)
Total Protein: 6.9 g/dL (ref 6.0–8.5)

## 2022-08-06 LAB — LIPID PANEL
Chol/HDL Ratio: 3.1 ratio (ref 0.0–4.4)
Cholesterol, Total: 199 mg/dL (ref 100–199)
HDL: 64 mg/dL (ref >39)
LDL Chol Calc (NIH): 121 mg/dL — ABNORMAL HIGH (ref 0–99)
Triglycerides: 78 mg/dL (ref 0–149)
VLDL Cholesterol Cal: 14 mg/dL (ref 5–40)

## 2022-08-07 ENCOUNTER — Encounter: Payer: Self-pay | Admitting: Interventional Cardiology

## 2022-08-07 DIAGNOSIS — E785 Hyperlipidemia, unspecified: Secondary | ICD-10-CM

## 2022-08-08 ENCOUNTER — Other Ambulatory Visit (HOSPITAL_COMMUNITY): Payer: Self-pay

## 2022-08-08 MED ORDER — ATORVASTATIN CALCIUM 10 MG PO TABS
10.0000 mg | ORAL_TABLET | Freq: Every day | ORAL | 3 refills | Status: DC
  Filled 2022-08-08: qty 90, 90d supply, fill #0

## 2022-08-08 NOTE — Telephone Encounter (Signed)
Spoke with patient.  She is in agreement to try atorvastatin 10 mg daily.  If tolerates will come for fasting lipids/liver in 12 weeks.  If she does not, she will let us know.  Labs ordered and scheduled, medication list updated.

## 2022-08-09 ENCOUNTER — Other Ambulatory Visit (HOSPITAL_COMMUNITY): Payer: Self-pay

## 2022-08-09 DIAGNOSIS — B3732 Chronic candidiasis of vulva and vagina: Secondary | ICD-10-CM | POA: Diagnosis not present

## 2022-08-09 DIAGNOSIS — Z01419 Encounter for gynecological examination (general) (routine) without abnormal findings: Secondary | ICD-10-CM | POA: Diagnosis not present

## 2022-08-09 DIAGNOSIS — B009 Herpesviral infection, unspecified: Secondary | ICD-10-CM | POA: Diagnosis not present

## 2022-08-09 DIAGNOSIS — Z124 Encounter for screening for malignant neoplasm of cervix: Secondary | ICD-10-CM | POA: Diagnosis not present

## 2022-08-09 DIAGNOSIS — N390 Urinary tract infection, site not specified: Secondary | ICD-10-CM | POA: Diagnosis not present

## 2022-08-09 DIAGNOSIS — Z6826 Body mass index (BMI) 26.0-26.9, adult: Secondary | ICD-10-CM | POA: Diagnosis not present

## 2022-08-09 DIAGNOSIS — Z1151 Encounter for screening for human papillomavirus (HPV): Secondary | ICD-10-CM | POA: Diagnosis not present

## 2022-08-09 MED ORDER — FLUCONAZOLE 150 MG PO TABS
150.0000 mg | ORAL_TABLET | ORAL | 6 refills | Status: DC
  Filled 2022-08-09: qty 2, 7d supply, fill #0
  Filled 2022-09-19: qty 2, 7d supply, fill #1
  Filled 2022-09-29: qty 2, 7d supply, fill #2
  Filled 2022-12-02: qty 2, 7d supply, fill #3
  Filled 2022-12-09: qty 2, 7d supply, fill #4
  Filled 2022-12-18: qty 2, 7d supply, fill #5
  Filled 2023-01-29: qty 2, 7d supply, fill #6

## 2022-08-09 MED ORDER — ACYCLOVIR 5 % EX OINT
1.0000 | TOPICAL_OINTMENT | CUTANEOUS | 2 refills | Status: DC
  Filled 2022-08-09: qty 30, 5d supply, fill #0
  Filled 2022-09-19: qty 30, 5d supply, fill #1
  Filled 2022-09-29: qty 30, 5d supply, fill #2

## 2022-08-09 MED ORDER — PHENAZOPYRIDINE HCL 200 MG PO TABS
200.0000 mg | ORAL_TABLET | Freq: Three times a day (TID) | ORAL | 2 refills | Status: DC
  Filled 2022-08-09: qty 6, 2d supply, fill #0
  Filled 2022-09-19: qty 6, 2d supply, fill #1
  Filled 2022-09-29: qty 6, 2d supply, fill #2

## 2022-08-09 MED ORDER — NITROFURANTOIN MONOHYD MACRO 100 MG PO CAPS
100.0000 mg | ORAL_CAPSULE | Freq: Two times a day (BID) | ORAL | 3 refills | Status: DC
  Filled 2022-08-09: qty 14, 7d supply, fill #0
  Filled 2022-09-19: qty 14, 7d supply, fill #1
  Filled 2022-09-29: qty 14, 7d supply, fill #2
  Filled 2022-11-04: qty 14, 7d supply, fill #3

## 2022-08-13 ENCOUNTER — Ambulatory Visit (INDEPENDENT_AMBULATORY_CARE_PROVIDER_SITE_OTHER): Payer: Self-pay | Admitting: Plastic Surgery

## 2022-08-13 DIAGNOSIS — Z719 Counseling, unspecified: Secondary | ICD-10-CM

## 2022-08-13 NOTE — Progress Notes (Signed)

## 2022-08-14 ENCOUNTER — Encounter: Payer: Self-pay | Admitting: Orthopedic Surgery

## 2022-08-14 ENCOUNTER — Ambulatory Visit (INDEPENDENT_AMBULATORY_CARE_PROVIDER_SITE_OTHER): Payer: 59

## 2022-08-14 ENCOUNTER — Ambulatory Visit: Payer: 59 | Admitting: Orthopedic Surgery

## 2022-08-14 DIAGNOSIS — M25551 Pain in right hip: Secondary | ICD-10-CM | POA: Diagnosis not present

## 2022-08-14 NOTE — Progress Notes (Signed)
Office Visit Note   Patient: Jane Gutierrez           Date of Birth: 1956/07/31           MRN: [id number] Visit Date: 08/14/2022 Requested by: Reynold Bowen, MD [place],  [place] [address] PCP: Reynold Bowen, MD  Subjective: Chief Complaint  Patient presents with   Right Hip - Pain    HPI: Briannah is a patient with right hip pain starting 7 weeks ago.  Initially had some back pain but then her hip started popping and then it moved into the groin and trochanteric region.  Her knee pops when she flexes her hip above the level of her pelvis.  Is not painful to sit.  She does report some groin pain.  Patient states she is not limping.  Walks about 10,000 steps a day.  Likes to exercise and do Pilates.  She has treated this with stretching as well as a Theragran for deep tissue massage.  Pain is worse at night level 8 out of 10 Denies any radicular pain or numbness and tingling              ROS: All systems reviewed are negative as they relate to the chief complaint within the history of present illness.  Patient denies  fevers or chills.   Assessment & Plan: Visit Diagnoses:  1. Pain of right hip     Plan: Impression is right hip pain.  May be labral or occult degeneration not visible on plain radiographs.  Cystic change in the femoral head is another finding indicating further imaging is indicated particularly with her history.  Night pain and duration of symptoms as well as failure of conservative treatment warrant MRI of the pelvis with and without contrast to evaluate source of right hip popping.  May consider ultrasound-guided injection into the hip joint upon return office visit.  Follow-Up Instructions: Return for after MRI.   Orders:  Orders Placed This Encounter  Procedures   XR HIP UNILAT W OR W/O PELVIS 2-3 VIEWS RIGHT   MR PELVIS W WO CONTRAST   No orders of the defined types were placed in this encounter.     Procedures: No procedures  performed   Clinical Data: No additional findings.  Objective: Vital Signs: There were no vitals taken for this visit.  Physical Exam:   Constitutional: Patient appears well-developed HEENT:  Head: Normocephalic Eyes:EOM are normal Neck: Normal range of motion Cardiovascular: Normal rate Pulmonary/chest: Effort normal Neurologic: Patient is alert Skin: Skin is warm Psychiatric: Patient has normal mood and affect   Ortho Exam: Ortho exam demonstrates full active and passive range of motion of both hips.  Mild groin pain on the right with internal/external Tatian of the leg.  Hip flexion does report creates some soft tissue popping in the groin region.  Difficult to say if this is labral or hip flexor tendon.  Mild trochanteric tenderness is noted.  She also has some sciatic notch tenderness but no masses in that region.  Ligaments are equal.  Gait is normal.  Specialty Comments:  No specialty comments available.  Imaging: XR HIP UNILAT W OR W/O PELVIS 2-3 VIEWS RIGHT  Result Date: 08/14/2022 AP pelvis lateral right hip reviewed.  Minimal degenerative changes present in both hips.  Bony pelvis otherwise without fracture.  Cystic change noted in the lateral aspect of the femoral head on the right.  Measures 1 x 1 cm.  Best visualized on the  AP view less well visualized on the lateral view.  Proximal femur otherwise without evidence of fracture or FAI.    PMFS History: Patient Active Problem List   Diagnosis Date Noted   Breast pain 05/28/2022   Encounter for counseling 10/16/2021   Hardening of the aorta (main artery of the heart) ([place]) 03/02/2021   Genital herpes simplex 02/22/2021   Family history of diseases of the blood and blood-forming organs and certain disorders involving the immune mechanism 10/21/2019   Polyneuropathy 10/21/2019   Rash and other nonspecific skin eruption 10/21/2019   Raynaud's disease 01/12/2019   Osteoarthritis 11/12/2018   Cough 10/27/2017    Pain in right hand 09/02/2017   Chronic sinusitis 08/18/2017   Wheezing 08/18/2017   Psychophysiologic insomnia 01/06/2017   Pain in left elbow 01/02/2016   Dyspnea 09/11/2015   Abnormal weight loss 08/17/2015   Alopecia 08/17/2015   Fatigue 08/17/2015   Joint pain 08/17/2015   Breast cancer of upper-outer quadrant of left female breast ([place]) 06/28/2015   Nontoxic single thyroid nodule 12/28/2014   Right hip pain 09/19/2014   Right knee pain 09/19/2014   Dyspnea on exertion 08/08/2014   Heart palpitations 08/08/2014   Acquired absence of breast and absent nipple, bilateral 03/09/2014   Myopathy 02/01/2014   Localized swelling, mass and lump, trunk 11/22/2013   Pre-operative cardiovascular examination 09/09/2013   Other chronic pulmonary heart diseases 05/27/2013   SIRS (systemic inflammatory response syndrome) ([place]) 04/14/2013   Anemia 04/14/2013   Dehydration 04/14/2013   Fever 04/14/2013   IBS (irritable bowel syndrome)    ADD (attention deficit disorder)    Lyme disease    Asthma    Hypothyroidism    Contact lens/glasses fitting    Hyperlipidemia 11/16/2012   Headache 02/05/2012   Benign neoplasm of colon 09/17/2011   Migraine without aura, not refractory 09/17/2011   Malignant neoplasm of cervix uteri ([place]) 09/06/2009   Personal history of other specified conditions 09/06/2009   Thrombophlebitis of superficial veins of lower extremity 09/06/2009   Varicose veins of other specified sites 09/06/2009   Past Medical History:  Diagnosis Date   Abnormally small mouth    ADD (attention deficit disorder)    ADHD    Allergy    Anxiety    pt reported she doesn't have anxiety   Asthma    prn inhaler   Blood transfusion without reported diagnosis    Breast cancer ([place])    Dental crowns present    Difficulty swallowing pills    History of breast cancer    History of cervical cancer    History of palpitations    last echo 08/22/2014   Hypothyroidism    IBS (irritable  bowel syndrome)     Family History  Problem Relation Age of Onset   Cancer Mother 59       uterine, mult myeloma   CVA Father    Cancer Sister 89       breast   Leukemia Sister        CLL   Lupus Sister        twin sister   Sarcoidosis Sister        twin sister   Rheum arthritis Sister        twin sister   Cancer Maternal Grandmother 22       breast   Celiac disease Daughter    Migraines Daughter     Past Surgical History:  Procedure Laterality Date   ABDOMINAL HYSTERECTOMY  ABDOMINOPLASTY  03/28/2004   AXILLARY SENTINEL NODE BIOPSY  12/31/2012   Procedure: AXILLARY SENTINEL NODE BIOPSY;  Surgeon: Rolm Bookbinder, MD;  Location: [facility];  Service: General;  Laterality: Bilateral;  bilateral sentinel node   BRAIN SURGERY     removal benign frontal lobe cyst in [place] Bilateral ~ 2008   BREAST IMPLANT EXCHANGE Bilateral 12/11/2017   Procedure: BILATERAL IMPLANT EXCHANGE;  Surgeon: Wallace Going, DO;  Location: [facility];  Service: Plastics;  Laterality: Bilateral;   BREAST RECONSTRUCTION WITH PLACEMENT OF TISSUE EXPANDER AND FLEX HD (ACELLULAR HYDRATED DERMIS)  12/31/2012   Procedure: BREAST RECONSTRUCTION WITH PLACEMENT OF TISSUE EXPANDER AND FLEX HD (ACELLULAR HYDRATED DERMIS);  Surgeon: Theodoro Kos, DO;  Location: [facility];  Service: Plastics;  Laterality: Bilateral;  bilateral immediate breast reconstruction with expanders and flex hd    CAPSULOTOMY Left 09/01/2014   Procedure: CAPSULOTOMY OF LEFT BREAST WITH LIPOFILLING FOR SYMMETRY;  Surgeon: Theodoro Kos, DO;  Location: [facility];  Service: Plastics;  Laterality: Left;   CERVICAL CERCLAGE     DIAGNOSTIC LAPAROSCOPY  05/12/2003   right partial salpingectomy; lysis paraovarian adhesions left   DILATION AND CURETTAGE OF UTERUS  2005   ENDOMETRIAL FULGURATION  05/12/2003   FACIAL COSMETIC SURGERY  ~ 2010   FOOT SURGERY  Bilateral 07/2012   GYNECOLOGIC CRYOSURGERY  1998   INCISION AND DRAINAGE OF WOUND Left 04/28/2013   Procedure: IRRIGATION AND DEBRIDEMENT LEFT BREAST WOUND, POSSIBLE CLOSURE OF WOUND WITH DRAIN PLACEMENT;  Surgeon: Theodoro Kos, DO;  Location: [facility];  Service: Plastics;  Laterality: Left;   LATISSIMUS FLAP TO BREAST Left 09/29/2013   Procedure: LATISSIMUS FLAP TO BREAST WITH TISSUE EXPANDER PLACEMENT FOR LEFT BREAST RECONSTRUCTION;  Surgeon: Theodoro Kos, DO;  Location: [facility];  Service: Plastics;  Laterality: Left;   LIPECTOMY Bilateral 03/28/2004   hip   LIPOSUCTION Bilateral 03/09/2014   Procedure: LIPOSUCTION ABDOMEN W/ LIPOFILLING LATERAL BILATERAL BREAST;  Surgeon: Theodoro Kos, DO;  Location: [facility];  Service: Plastics;  Laterality: Bilateral;  LIPOSUCTION NECK   LIPOSUCTION WITH LIPOFILLING Bilateral 09/01/2014   Procedure: LIPOSUCTION WITH LIPOFILLING;  Surgeon: Theodoro Kos, DO;  Location: [facility];  Service: Plastics;  Laterality: Bilateral;   LYSIS OF ADHESION  03/28/2004   pelvic   MASTECTOMY     PORT-A-CATH REMOVAL Right 03/09/2014   Procedure: REMOVAL PORT-A-CATH;  Surgeon: Theodoro Kos, DO;  Location: [facility];  Service: Plastics;  Laterality: Right;   PORTACATH PLACEMENT  01/27/2013   Procedure: INSERTION PORT-A-CATH;  Surgeon: Rolm Bookbinder, MD;  Location: [facility];  Service: General;  Laterality: Right;   RADIOFREQUENCY ABLATION NERVES  05/12/2003   uterosacral nerve ablation   REMOVAL OF BILATERAL TISSUE EXPANDERS WITH PLACEMENT OF BILATERAL BREAST IMPLANTS Bilateral 03/09/2014   Procedure: REMOVAL OF BILATERAL TISSUE EXPANDERS WITH PLACEMENT OF BILATERAL BREAST IMPLANTS;  Surgeon: Theodoro Kos, DO;  Location: [facility];  Service: Plastics;  Laterality: Bilateral;   TEE WITHOUT CARDIOVERSION N/A 06/04/2013   Procedure: TRANSESOPHAGEAL ECHOCARDIOGRAM (TEE);  Surgeon: Jolaine Artist, MD;  Location: [facility] ENDOSCOPY;  Service: Cardiovascular;  Laterality: N/A;   TISSUE EXPANDER PLACEMENT Left 06/16/2013   Procedure: LEFT BREAST TISSUE EXPANDER REMOVAL;  Surgeon: Theodoro Kos, DO;  Location: [facility];  Service: Plastics;  Laterality: Left;   TISSUE EXPANDER PLACEMENT Left 09/29/2013   Procedure: TISSUE EXPANDER;  Surgeon: Theodoro Kos, DO;  Location: [facility];  Service: Plastics;  Laterality: Left;   TOTAL ABDOMINAL HYSTERECTOMY W/ BILATERAL SALPINGOOPHORECTOMY  03/28/2004   TOTAL MASTECTOMY  12/31/2012   Procedure: TOTAL MASTECTOMY;  Surgeon: Rolm Bookbinder, MD;  Location: [facility];  Service: General;  Laterality: Bilateral;  bilateral total mastectomies   Social History   Occupational History   Not on file  Tobacco Use   Smoking status: Never   Smokeless tobacco: Never  Substance and Sexual Activity   Alcohol use: No    Alcohol/week: 0.0 standard drinks of alcohol    Comment: occasionally   Drug use: No   Sexual activity: Yes

## 2022-08-21 ENCOUNTER — Encounter: Payer: Self-pay | Admitting: Interventional Cardiology

## 2022-08-21 DIAGNOSIS — E785 Hyperlipidemia, unspecified: Secondary | ICD-10-CM

## 2022-08-23 ENCOUNTER — Ambulatory Visit (HOSPITAL_COMMUNITY)
Admission: RE | Admit: 2022-08-23 | Discharge: 2022-08-23 | Disposition: A | Discharge status: Home / Self Care | Payer: 59 | Source: Ambulatory Visit | Attending: Plastic Surgery | Admitting: Plastic Surgery

## 2022-08-23 DIAGNOSIS — Z853 Personal history of malignant neoplasm of breast: Secondary | ICD-10-CM | POA: Diagnosis not present

## 2022-08-23 DIAGNOSIS — N644 Mastodynia: Secondary | ICD-10-CM | POA: Diagnosis not present

## 2022-08-23 MED ORDER — GADOBUTROL 1 MMOL/ML IV SOLN
7.0000 mL | Freq: Once | INTRAVENOUS | Status: AC | PRN
Start: 2022-08-23 — End: 2022-08-23
  Administered 2022-08-23: 7 mL via INTRAVENOUS

## 2022-08-23 NOTE — Telephone Encounter (Signed)
Jane Booze, MD to Me  [place], [facility]    Would refer to lipid clinic as she has not tolerated Crestor and lipitor.  JV

## 2022-08-28 ENCOUNTER — Telehealth: Payer: Self-pay | Admitting: *Deleted

## 2022-08-28 ENCOUNTER — Other Ambulatory Visit (HOSPITAL_COMMUNITY): Payer: Self-pay

## 2022-08-28 NOTE — Telephone Encounter (Signed)
Pt notified of MRI results per Dr. Marla Roe

## 2022-08-30 ENCOUNTER — Ambulatory Visit
Admission: RE | Admit: 2022-08-30 | Discharge: 2022-08-30 | Disposition: A | Discharge status: Home / Self Care | Payer: 59 | Source: Ambulatory Visit | Attending: Orthopedic Surgery | Admitting: Orthopedic Surgery

## 2022-08-30 DIAGNOSIS — S76312A Strain of muscle, fascia and tendon of the posterior muscle group at thigh level, left thigh, initial encounter: Secondary | ICD-10-CM | POA: Diagnosis not present

## 2022-08-30 DIAGNOSIS — M25551 Pain in right hip: Secondary | ICD-10-CM

## 2022-08-30 MED ORDER — GADOBENATE DIMEGLUMINE 529 MG/ML IV SOLN
13.0000 mL | Freq: Once | INTRAVENOUS | Status: AC | PRN
  Administered 2022-08-30: 13 mL via INTRAVENOUS

## 2022-09-05 DIAGNOSIS — L570 Actinic keratosis: Secondary | ICD-10-CM | POA: Diagnosis not present

## 2022-09-05 DIAGNOSIS — D485 Neoplasm of uncertain behavior of skin: Secondary | ICD-10-CM | POA: Diagnosis not present

## 2022-09-05 DIAGNOSIS — Z08 Encounter for follow-up examination after completed treatment for malignant neoplasm: Secondary | ICD-10-CM | POA: Diagnosis not present

## 2022-09-05 DIAGNOSIS — L814 Other melanin hyperpigmentation: Secondary | ICD-10-CM | POA: Diagnosis not present

## 2022-09-05 DIAGNOSIS — D2262 Melanocytic nevi of left upper limb, including shoulder: Secondary | ICD-10-CM | POA: Diagnosis not present

## 2022-09-05 DIAGNOSIS — D225 Melanocytic nevi of trunk: Secondary | ICD-10-CM | POA: Diagnosis not present

## 2022-09-05 DIAGNOSIS — Z85828 Personal history of other malignant neoplasm of skin: Secondary | ICD-10-CM | POA: Diagnosis not present

## 2022-09-05 DIAGNOSIS — L821 Other seborrheic keratosis: Secondary | ICD-10-CM | POA: Diagnosis not present

## 2022-09-09 DIAGNOSIS — H524 Presbyopia: Secondary | ICD-10-CM | POA: Diagnosis not present

## 2022-09-11 ENCOUNTER — Ambulatory Visit: Payer: 59 | Admitting: Orthopedic Surgery

## 2022-09-11 DIAGNOSIS — M25551 Pain in right hip: Secondary | ICD-10-CM

## 2022-09-14 ENCOUNTER — Encounter: Payer: Self-pay | Admitting: Orthopedic Surgery

## 2022-09-14 NOTE — Progress Notes (Signed)
Office Visit Note   Patient: Jane Gutierrez           Date of Birth: September 16, 1956           MRN: [id number] Visit Date: 09/11/2022 Requested by: Reynold Bowen, MD [address] [place],  [place] [address] PCP: Reynold Bowen, MD  Subjective: Chief Complaint  Patient presents with   Other    Scan review    HPI: Jane Gutierrez is a 66 year old patient with right hip pain.  Since he was last seen she has had an MRI scan which shows grade 1 anterolisthesis of L4 on L5 and L5 on S1 with bilateral facet arthropathy.  Mild osteoarthritis of both hips noted with some bursitis and tendinosis of the gluteus medius.  Overall no significant hip effusion.  Also having a little bit of right toe numbness and tingling likely related to her spondylolisthesis.  Also describing some lateral popping in the hip.  Thigh is a little sore by the end of the day.  She has a ski trip planned for January 12.              ROS: All systems reviewed are negative as they relate to the chief complaint within the history of present illness.  Patient denies  fevers or chills.   Assessment & Plan: Visit Diagnoses:  1. Pain of right hip     Plan: Impression is likely right-sided hip arthritis which is mild but giving her enough symptoms that it is interfering with some of her ADLs and recreational activities.  Would like for her to start physical therapy for exercise and strengthening with a diagnosis of mild right hip arthritis.  Also could benefit from some stretching and exercise training to avoid loadbearing on the right hip.  1-2 times a week for 1 to 4 weeks.  Would like for her to come back third week in December for scheduled right hip injection prior to her ski trip.  Follow-Up Instructions: No follow-ups on file.   Orders:  Orders Placed This Encounter  Procedures   Ambulatory referral to Physical Therapy   No orders of the defined types were placed in this encounter.     Procedures: No procedures  performed   Clinical Data: No additional findings.  Objective: Vital Signs: There were no vitals taken for this visit.  Physical Exam:   Constitutional: Patient appears well-developed HEENT:  Head: Normocephalic Eyes:EOM are normal Neck: Normal range of motion Cardiovascular: Normal rate Pulmonary/chest: Effort normal Neurologic: Patient is alert Skin: Skin is warm Psychiatric: Patient has normal mood and affect   Ortho Exam: Ortho exam demonstrates excellent hip flexion abduction adduction strength.  No real pain with internal/external rotation of the right or left leg.  No trochanteric tenderness is noted.  Has 5 out of 5 ankle dorsiflexion plantarflexion quad hamstring strength with palpable pedal pulses.  No other masses lymphadenopathy or skin changes noted in that right hip or leg region.  Specialty Comments:  No specialty comments available.  Imaging: No results found.   PMFS History: Patient Active Problem List   Diagnosis Date Noted   Breast pain 05/28/2022   Encounter for counseling 10/16/2021   Hardening of the aorta (main artery of the heart) ([place]) 03/02/2021   Genital herpes simplex 02/22/2021   Family history of diseases of the blood and blood-forming organs and certain disorders involving the immune mechanism 10/21/2019   Polyneuropathy 10/21/2019   Rash and other nonspecific skin eruption 10/21/2019   Raynaud's disease  01/12/2019   Osteoarthritis 11/12/2018   Cough 10/27/2017   Pain in right hand 09/02/2017   Chronic sinusitis 08/18/2017   Wheezing 08/18/2017   Psychophysiologic insomnia 01/06/2017   Pain in left elbow 01/02/2016   Dyspnea 09/11/2015   Abnormal weight loss 08/17/2015   Alopecia 08/17/2015   Fatigue 08/17/2015   Joint pain 08/17/2015   Breast cancer of upper-outer quadrant of left female breast ([place]) 06/28/2015   Nontoxic single thyroid nodule 12/28/2014   Right hip pain 09/19/2014   Right knee pain 09/19/2014   Dyspnea on  exertion 08/08/2014   Heart palpitations 08/08/2014   Acquired absence of breast and absent nipple, bilateral 03/09/2014   Myopathy 02/01/2014   Localized swelling, mass and lump, trunk 11/22/2013   Pre-operative cardiovascular examination 09/09/2013   Other chronic pulmonary heart diseases 05/27/2013   SIRS (systemic inflammatory response syndrome) ([place]) 04/14/2013   Anemia 04/14/2013   Dehydration 04/14/2013   Fever 04/14/2013   IBS (irritable bowel syndrome)    ADD (attention deficit disorder)    Lyme disease    Asthma    Hypothyroidism    Contact lens/glasses fitting    Hyperlipidemia 11/16/2012   Headache 02/05/2012   Benign neoplasm of colon 09/17/2011   Migraine without aura, not refractory 09/17/2011   Malignant neoplasm of cervix uteri ([place]) 09/06/2009   Personal history of other specified conditions 09/06/2009   Thrombophlebitis of superficial veins of lower extremity 09/06/2009   Varicose veins of other specified sites 09/06/2009   Past Medical History:  Diagnosis Date   Abnormally small mouth    ADD (attention deficit disorder)    ADHD    Allergy    Anxiety    pt reported she doesn't have anxiety   Asthma    prn inhaler   Blood transfusion without reported diagnosis    Breast cancer ([place])    Dental crowns present    Difficulty swallowing pills    History of breast cancer    History of cervical cancer    History of palpitations    last echo 08/22/2014   Hypothyroidism    IBS (irritable bowel syndrome)     Family History  Problem Relation Age of Onset   Cancer Mother 66       uterine, mult myeloma   CVA Father    Cancer Sister 26       breast   Leukemia Sister        CLL   Lupus Sister        twin sister   Sarcoidosis Sister        twin sister   Rheum arthritis Sister        twin sister   Cancer Maternal Grandmother 62       breast   Celiac disease Daughter    Migraines Daughter     Past Surgical History:  Procedure Laterality Date    ABDOMINAL HYSTERECTOMY     ABDOMINOPLASTY  03/28/2004   AXILLARY SENTINEL NODE BIOPSY  12/31/2012   Procedure: AXILLARY SENTINEL NODE BIOPSY;  Surgeon: Rolm Bookbinder, MD;  Location: [facility];  Service: General;  Laterality: Bilateral;  bilateral sentinel node   BRAIN SURGERY     removal benign frontal lobe cyst in [place] Bilateral ~ 2008   BREAST IMPLANT EXCHANGE Bilateral 12/11/2017   Procedure: BILATERAL IMPLANT EXCHANGE;  Surgeon: Wallace Going, DO;  Location: [facility];  Service: Plastics;  Laterality: Bilateral;   BREAST  RECONSTRUCTION WITH PLACEMENT OF TISSUE EXPANDER AND FLEX HD (ACELLULAR HYDRATED DERMIS)  12/31/2012   Procedure: BREAST RECONSTRUCTION WITH PLACEMENT OF TISSUE EXPANDER AND FLEX HD (ACELLULAR HYDRATED DERMIS);  Surgeon: Theodoro Kos, DO;  Location: [facility];  Service: Plastics;  Laterality: Bilateral;  bilateral immediate breast reconstruction with expanders and flex hd    CAPSULOTOMY Left 09/01/2014   Procedure: CAPSULOTOMY OF LEFT BREAST WITH LIPOFILLING FOR SYMMETRY;  Surgeon: Theodoro Kos, DO;  Location: [facility];  Service: Plastics;  Laterality: Left;   CERVICAL CERCLAGE     DIAGNOSTIC LAPAROSCOPY  05/12/2003   right partial salpingectomy; lysis paraovarian adhesions left   DILATION AND CURETTAGE OF UTERUS  2005   ENDOMETRIAL FULGURATION  05/12/2003   FACIAL COSMETIC SURGERY  ~ 2010   FOOT SURGERY Bilateral 07/2012   GYNECOLOGIC CRYOSURGERY  1998   INCISION AND DRAINAGE OF WOUND Left 04/28/2013   Procedure: IRRIGATION AND DEBRIDEMENT LEFT BREAST WOUND, POSSIBLE CLOSURE OF WOUND WITH DRAIN PLACEMENT;  Surgeon: Theodoro Kos, DO;  Location: [facility];  Service: Plastics;  Laterality: Left;   LATISSIMUS FLAP TO BREAST Left 09/29/2013   Procedure: LATISSIMUS FLAP TO BREAST WITH TISSUE EXPANDER PLACEMENT FOR LEFT BREAST RECONSTRUCTION;  Surgeon: Theodoro Kos,  DO;  Location: [facility];  Service: Plastics;  Laterality: Left;   LIPECTOMY Bilateral 03/28/2004   hip   LIPOSUCTION Bilateral 03/09/2014   Procedure: LIPOSUCTION ABDOMEN W/ LIPOFILLING LATERAL BILATERAL BREAST;  Surgeon: Theodoro Kos, DO;  Location: [facility];  Service: Plastics;  Laterality: Bilateral;  LIPOSUCTION NECK   LIPOSUCTION WITH LIPOFILLING Bilateral 09/01/2014   Procedure: LIPOSUCTION WITH LIPOFILLING;  Surgeon: Theodoro Kos, DO;  Location: [facility];  Service: Plastics;  Laterality: Bilateral;   LYSIS OF ADHESION  03/28/2004   pelvic   MASTECTOMY     PORT-A-CATH REMOVAL Right 03/09/2014   Procedure: REMOVAL PORT-A-CATH;  Surgeon: Theodoro Kos, DO;  Location: [facility];  Service: Plastics;  Laterality: Right;   PORTACATH PLACEMENT  01/27/2013   Procedure: INSERTION PORT-A-CATH;  Surgeon: Rolm Bookbinder, MD;  Location: [facility];  Service: General;  Laterality: Right;   RADIOFREQUENCY ABLATION NERVES  05/12/2003   uterosacral nerve ablation   REMOVAL OF BILATERAL TISSUE EXPANDERS WITH PLACEMENT OF BILATERAL BREAST IMPLANTS Bilateral 03/09/2014   Procedure: REMOVAL OF BILATERAL TISSUE EXPANDERS WITH PLACEMENT OF BILATERAL BREAST IMPLANTS;  Surgeon: Theodoro Kos, DO;  Location: [facility] [facility];  Service: Plastics;  Laterality: Bilateral;   TEE WITHOUT CARDIOVERSION N/A 06/04/2013   Procedure: TRANSESOPHAGEAL ECHOCARDIOGRAM (TEE);  Surgeon: Jolaine Artist, MD;  Location: [facility] ENDOSCOPY;  Service: Cardiovascular;  Laterality: N/A;   TISSUE EXPANDER PLACEMENT Left 06/16/2013   Procedure: LEFT BREAST TISSUE EXPANDER REMOVAL;  Surgeon: Theodoro Kos, DO;  Location: [facility] [place];  Service: Plastics;  Laterality: Left;   TISSUE EXPANDER PLACEMENT Left 09/29/2013   Procedure: TISSUE EXPANDER;  Surgeon: Theodoro Kos, DO;  Location: [facility];  Service: Plastics;  Laterality: Left;   TOTAL ABDOMINAL HYSTERECTOMY W/  BILATERAL SALPINGOOPHORECTOMY  03/28/2004   TOTAL MASTECTOMY  12/31/2012   Procedure: TOTAL MASTECTOMY;  Surgeon: Rolm Bookbinder, MD;  Location: [facility];  Service: General;  Laterality: Bilateral;  bilateral total mastectomies   Social History   Occupational History   Not on file  Tobacco Use   Smoking status: Never   Smokeless tobacco: Never  Substance and Sexual Activity   Alcohol use: No  Alcohol/week: 0.0 standard drinks of alcohol    Comment: occasionally   Drug use: No   Sexual activity: Yes

## 2022-09-17 ENCOUNTER — Encounter: Payer: Self-pay | Admitting: Pharmacist Clinician (PhC)/ Clinical Pharmacy Specialist

## 2022-09-17 ENCOUNTER — Ambulatory Visit: Payer: 59 | Attending: Cardiovascular Disease | Admitting: Pharmacist Clinician (PhC)/ Clinical Pharmacy Specialist

## 2022-09-17 DIAGNOSIS — E785 Hyperlipidemia, unspecified: Secondary | ICD-10-CM | POA: Diagnosis not present

## 2022-09-17 NOTE — Assessment & Plan Note (Signed)
Patient has previously tried two statin therapies; atorvastatin and rosuvastatin and reported intolerable GI side effects complicated by IBS. Pt opted for a coronary calcium screening and her score was in the 75th percentile for adjusted controls. Patient's husband takes Repatha for his cholesterol and patient expressed interest in moving forward with this after explaining the four newer therapies (Repatha, Praulent, Nexletol, Leqvio). Will move forward with approval of Repatha. Patient did not want to move forward with ezetimibe as she has a low cholesterol diet and did not believe she would benefit from it. Patient expressed interest in trying low dose rosuvastatin a couple days a week if Repatha is not approved. If Repatha is not approved will trial rosuvastatin 5 mg once weekly and titrate up as side effects allow

## 2022-09-17 NOTE — Patient Instructions (Addendum)
Your Results:             Your most recent labs Goal  Total Cholesterol 199 < 200  Triglycerides 78 < 150  HDL (happy/good cholesterol) 64 > 40  LDL (lousy/bad cholesterol 121 < 100   Medication changes:  We will start the process to get Repatha covered by your insurance.  Once the prior authorization is complete, we will call you to let you know and confirm pharmacy information.   You will do one injection every 14 days.    Lab orders:  We want to repeat labs after 2-3 months.  We will send you a lab order to remind you once we get closer to that time.      Thank you for choosing [facility]

## 2022-09-17 NOTE — Progress Notes (Signed)
09/17/2022 Jane Gutierrez January 19, 1956 [id number]   HPI:  Jane Gutierrez is a 66 y.o. female patient of Dr Irish Lack, who presents today for a lipid clinic evaluation.  Pt has PMH of IBS, asthma, hypothyroidism, and triple-positive breast cancer. She is s/p bilateral mastectomies with reconstruction. Pt has not tolerated rosuvastatin 10 mg in the past due to GI side effects. Wanted to try niacin or Nexletol, Nexletol not on insurance formulary and was rechallenged with atorvastatin and reported GI side effects and neuropathy in hand. Most recent LDL was 121.   Since last visit patient had a coronary calcium screening performed and her score was in the 75th percentile adjusted for controls. Pt has a history of IBS and was on chemo in past for breast cancer. Her stomach always bothered her due to IBS and she tends not to tolerate a lot of medications as they cause indigestion, diarrhea, vomiting. [employer] employee working in Paramedic with providers and can't be nauseous.    Current Medications: none  Cholesterol Goals: LDL < 70   Intolerant/previously tried: atorvastatin and rosuvastatin both caused GI distress, N/V  FH: Cancer (uterine)- father, CVA-father, Cancer (breast, CLL)- sister, Lupus-twin sister, sarcoidosis-twin sister, rheumatoid arthritis-twin sister, cancer (breast)-maternal grandmother, celiac disease-daughter, migraines-daughter  SH: Nonsmoker, occasional alcohol use, exercises often  Diet- Yogurt or power bars, fruit, chicken but not often, low sodium gluten free crackers and V8, does not eat much protein     Labs: 08/06/22:  TC 199, TG 78, HDL 64, LDL 121 (no medication)   Current Outpatient Medications  Medication Sig Dispense Refill   acyclovir ointment (ZOVIRAX) 5 % Apply 1 application topically to the affected area(s) every 3 hours, 6 times a day. 30 g 2   albuterol (VENTOLIN HFA) 108 (90 Base) MCG/ACT inhaler Inhale 2 puffs into the lungs every 6 (six) hours as needed  as directed 18 g 3   atorvastatin (LIPITOR) 10 MG tablet Take 1 tablet (10 mg total) by mouth daily. 90 tablet 3   B-Complex TABS      Biotin 1 MG CAPS Take 1 capsule by mouth daily.     COVID-19 mRNA bivalent vaccine, Pfizer, (PFIZER COVID-19 VAC BIVALENT) injection Inject into the muscle. 0.3 mL 0   dextroamphetamine (DEXTROSTAT) 10 MG tablet Take 2 tablets (20 mg total) by mouth 2 (two) times daily. 360 tablet 0   EPINEPHrine 0.3 mg/0.3 mL IJ SOAJ injection Inject 0.3 mg (1 pen) into the muscle as needed. 2 each 1   eszopiclone (LUNESTA) 1 MG TABS tablet Take 1 tablet (1 mg total) by mouth at bedtime as needed for sleep. Take immediately before bedtime 30 tablet 0   fluconazole (DIFLUCAN) 150 MG tablet Take 1 tablet by mouth now and repeat in 3 & 6 days 3 tablet 4   fluconazole (DIFLUCAN) 150 MG tablet Take 1 tablet (150 mg total) by mouth 2 (two) times a week. 2 tablet 6   hyoscyamine (ANASPAZ) 0.125 MG TBDP disintergrating tablet Place 0.125 mg under the tongue as needed.      loratadine (CLARITIN) 10 MG tablet Take 1 tab po QD     Multiple Vitamin (MULTIVITAMIN WITH MINERALS) TABS Take 1 tablet by mouth daily.     nitrofurantoin, macrocrystal-monohydrate, (MACROBID) 100 MG capsule Take 1 capsule (100 mg total) by mouth every 12 (twelve) hours with food for 5 days 10 capsule 0   nitrofurantoin, macrocrystal-monohydrate, (MACROBID) 100 MG capsule Take 1 capsule (100 mg total) by mouth every 12 (  twelve) hours for 7 days. 14 capsule 3   phenazopyridine (PYRIDIUM) 200 MG tablet Take 1 tablet (200 mg total) by mouth 3 (three) times daily. 6 tablet 2   SHINGRIX injection      SYNTHROID 100 MCG tablet Take 1 tablet (100 mcg total) by mouth every morning. 90 tablet 3   valACYclovir (VALTREX) 1000 MG tablet Take 1 tablet (1,000 mg total) by mouth daily. 90 tablet 4   valACYclovir (VALTREX) 500 MG tablet Take 1 tablet (500 mg total) by mouth every 12 (twelve) hours for 3 days 30 tablet 3   vitamin E  600 UNIT capsule Take 800 Units by mouth daily.      No current facility-administered medications for this visit.    Allergies  Allergen Reactions   Betadine [Povidone Iodine] Anaphylaxis   Doxycycline Rash   Iodine Anaphylaxis   Shellfish-Derived Products Anaphylaxis   Rosuvastatin Calcium Other (See Comments)    GI symptoms   Amoxicillin Rash   Cleocin [Clindamycin Hcl] Rash   Ginger Rash   Penicillins Rash   Sulfamethoxazole-Trimethoprim Rash    Past Medical History:  Diagnosis Date   Abnormally small mouth    ADD (attention deficit disorder)    ADHD    Allergy    Anxiety    pt reported she doesn't have anxiety   Asthma    prn inhaler   Blood transfusion without reported diagnosis    Breast cancer ([place])    Dental crowns present    Difficulty swallowing pills    History of breast cancer    History of cervical cancer    History of palpitations    last echo 08/22/2014   Hypothyroidism    IBS (irritable bowel syndrome)     There were no vitals taken for this visit.   Hyperlipidemia Patient has previously tried two statin therapies; atorvastatin and rosuvastatin and reported intolerable GI side effects complicated by IBS. Pt opted for a coronary calcium screening and her score was in the 75th percentile for adjusted controls. Patient's husband takes Repatha for his cholesterol and patient expressed interest in moving forward with this after explaining the four newer therapies (Repatha, Praulent, Nexletol, Leqvio). Will move forward with approval of Repatha. Patient did not want to move forward with ezetimibe as she has a low cholesterol diet and did not believe she would benefit from it. Patient expressed interest in trying low dose rosuvastatin a couple days a week if Repatha is not approved. If Repatha is not approved will trial rosuvastatin 5 mg once weekly and titrate up as side effects allow.  Rodolph Bong PharmD candidate [place] [employer] class of  2024  I was with student and patient for entire visit and agree with above assessment and plan.  Tommy Medal PharmD CPP [facility] [address] [place], [place] [address] [phone]

## 2022-09-18 ENCOUNTER — Ambulatory Visit: Payer: 59 | Admitting: Physical Therapy

## 2022-09-18 ENCOUNTER — Encounter: Payer: Self-pay | Admitting: Physical Therapy

## 2022-09-18 ENCOUNTER — Other Ambulatory Visit: Payer: Self-pay

## 2022-09-18 ENCOUNTER — Ambulatory Visit: Payer: 59 | Admitting: Student

## 2022-09-18 DIAGNOSIS — R29898 Other symptoms and signs involving the musculoskeletal system: Secondary | ICD-10-CM | POA: Diagnosis not present

## 2022-09-18 DIAGNOSIS — M6281 Muscle weakness (generalized): Secondary | ICD-10-CM

## 2022-09-18 DIAGNOSIS — M25551 Pain in right hip: Secondary | ICD-10-CM

## 2022-09-18 NOTE — Therapy (Signed)
OUTPATIENT PHYSICAL THERAPY LOWER EXTREMITY EVALUATION   Patient Name: Jane Gutierrez MRN: [id number] DOB:1956-07-25, 65 y.o., female Today's Date: 09/18/2022   PT End of Session - 09/18/22 0957     Visit Number 1    Number of Visits 4    Date for PT Re-Evaluation 10/16/22    Authorization Type [facility] $25 copay    PT Start Time 0802    PT Stop Time 0840    PT Time Calculation (min) 38 min    Activity Tolerance Patient tolerated treatment well    Behavior During Therapy [facility] for tasks assessed/performed             Past Medical History:  Diagnosis Date   Abnormally small mouth    ADD (attention deficit disorder)    ADHD    Allergy    Anxiety    pt reported she doesn't have anxiety   Asthma    prn inhaler   Blood transfusion without reported diagnosis    Breast cancer ([place])    Dental crowns present    Difficulty swallowing pills    History of breast cancer    History of cervical cancer    History of palpitations    last echo 08/22/2014   Hypothyroidism    IBS (irritable bowel syndrome)    Past Surgical History:  Procedure Laterality Date   ABDOMINAL HYSTERECTOMY     ABDOMINOPLASTY  03/28/2004   AXILLARY SENTINEL NODE BIOPSY  12/31/2012   Procedure: AXILLARY SENTINEL NODE BIOPSY;  Surgeon: Rolm Bookbinder, MD;  Location: [facility];  Service: General;  Laterality: Bilateral;  bilateral sentinel node   BRAIN SURGERY     removal benign frontal lobe cyst in [place] Bilateral ~ 2008   BREAST IMPLANT EXCHANGE Bilateral 12/11/2017   Procedure: BILATERAL IMPLANT EXCHANGE;  Surgeon: Wallace Going, DO;  Location: [facility];  Service: Plastics;  Laterality: Bilateral;   BREAST RECONSTRUCTION WITH PLACEMENT OF TISSUE EXPANDER AND FLEX HD (ACELLULAR HYDRATED DERMIS)  12/31/2012   Procedure: BREAST RECONSTRUCTION WITH PLACEMENT OF TISSUE EXPANDER AND FLEX HD (ACELLULAR HYDRATED DERMIS);  Surgeon: Theodoro Kos, DO;   Location: [facility];  Service: Plastics;  Laterality: Bilateral;  bilateral immediate breast reconstruction with expanders and flex hd    CAPSULOTOMY Left 09/01/2014   Procedure: CAPSULOTOMY OF LEFT BREAST WITH LIPOFILLING FOR SYMMETRY;  Surgeon: Theodoro Kos, DO;  Location: [facility];  Service: Plastics;  Laterality: Left;   CERVICAL CERCLAGE     DIAGNOSTIC LAPAROSCOPY  05/12/2003   right partial salpingectomy; lysis paraovarian adhesions left   DILATION AND CURETTAGE OF UTERUS  2005   ENDOMETRIAL FULGURATION  05/12/2003   FACIAL COSMETIC SURGERY  ~ 2010   FOOT SURGERY Bilateral 07/2012   GYNECOLOGIC CRYOSURGERY  1998   INCISION AND DRAINAGE OF WOUND Left 04/28/2013   Procedure: IRRIGATION AND DEBRIDEMENT LEFT BREAST WOUND, POSSIBLE CLOSURE OF WOUND WITH DRAIN PLACEMENT;  Surgeon: Theodoro Kos, DO;  Location: [facility];  Service: Plastics;  Laterality: Left;   LATISSIMUS FLAP TO BREAST Left 09/29/2013   Procedure: LATISSIMUS FLAP TO BREAST WITH TISSUE EXPANDER PLACEMENT FOR LEFT BREAST RECONSTRUCTION;  Surgeon: Theodoro Kos, DO;  Location: [facility];  Service: Plastics;  Laterality: Left;   LIPECTOMY Bilateral 03/28/2004   hip   LIPOSUCTION Bilateral 03/09/2014   Procedure: LIPOSUCTION ABDOMEN W/ LIPOFILLING LATERAL BILATERAL BREAST;  Surgeon: Theodoro Kos, DO;  Location: [facility];  Service: Clinical cytogeneticist;  Laterality: Bilateral;  LIPOSUCTION NECK   LIPOSUCTION WITH LIPOFILLING Bilateral 09/01/2014   Procedure: LIPOSUCTION WITH LIPOFILLING;  Surgeon: Theodoro Kos, DO;  Location: [facility];  Service: Plastics;  Laterality: Bilateral;   LYSIS OF ADHESION  03/28/2004   pelvic   MASTECTOMY     PORT-A-CATH REMOVAL Right 03/09/2014   Procedure: REMOVAL PORT-A-CATH;  Surgeon: Theodoro Kos, DO;  Location: [facility];  Service: Plastics;  Laterality: Right;   PORTACATH PLACEMENT  01/27/2013   Procedure: INSERTION  PORT-A-CATH;  Surgeon: Rolm Bookbinder, MD;  Location: [facility];  Service: General;  Laterality: Right;   RADIOFREQUENCY ABLATION NERVES  05/12/2003   uterosacral nerve ablation   REMOVAL OF BILATERAL TISSUE EXPANDERS WITH PLACEMENT OF BILATERAL BREAST IMPLANTS Bilateral 03/09/2014   Procedure: REMOVAL OF BILATERAL TISSUE EXPANDERS WITH PLACEMENT OF BILATERAL BREAST IMPLANTS;  Surgeon: Theodoro Kos, DO;  Location: [facility];  Service: Plastics;  Laterality: Bilateral;   TEE WITHOUT CARDIOVERSION N/A 06/04/2013   Procedure: TRANSESOPHAGEAL ECHOCARDIOGRAM (TEE);  Surgeon: Jolaine Artist, MD;  Location: [facility];  Service: Cardiovascular;  Laterality: N/A;   TISSUE EXPANDER PLACEMENT Left 06/16/2013   Procedure: LEFT BREAST TISSUE EXPANDER REMOVAL;  Surgeon: Theodoro Kos, DO;  Location: [facility];  Service: Plastics;  Laterality: Left;   TISSUE EXPANDER PLACEMENT Left 09/29/2013   Procedure: TISSUE EXPANDER;  Surgeon: Theodoro Kos, DO;  Location: [place];  Service: Plastics;  Laterality: Left;   TOTAL ABDOMINAL HYSTERECTOMY W/ BILATERAL SALPINGOOPHORECTOMY  03/28/2004   TOTAL MASTECTOMY  12/31/2012   Procedure: TOTAL MASTECTOMY;  Surgeon: Rolm Bookbinder, MD;  Location: [facility];  Service: General;  Laterality: Bilateral;  bilateral total mastectomies   Patient Active Problem List   Diagnosis Date Noted   Breast pain 05/28/2022   Encounter for counseling 10/16/2021   Hardening of the aorta (main artery of the heart) ([place]) 03/02/2021   Genital herpes simplex 02/22/2021   Family history of diseases of the blood and blood-forming organs and certain disorders involving the immune mechanism 10/21/2019   Polyneuropathy 10/21/2019   Rash and other nonspecific skin eruption 10/21/2019   Raynaud's disease 01/12/2019   Osteoarthritis 11/12/2018   Cough 10/27/2017   Pain in right hand 09/02/2017   Chronic sinusitis 08/18/2017    Wheezing 08/18/2017   Psychophysiologic insomnia 01/06/2017   Pain in left elbow 01/02/2016   Dyspnea 09/11/2015   Abnormal weight loss 08/17/2015   Alopecia 08/17/2015   Fatigue 08/17/2015   Joint pain 08/17/2015   Breast cancer of upper-outer quadrant of left female breast ([place]) 06/28/2015   Nontoxic single thyroid nodule 12/28/2014   Right hip pain 09/19/2014   Right knee pain 09/19/2014   Dyspnea on exertion 08/08/2014   Heart palpitations 08/08/2014   Acquired absence of breast and absent nipple, bilateral 03/09/2014   Myopathy 02/01/2014   Localized swelling, mass and lump, trunk 11/22/2013   Pre-operative cardiovascular examination 09/09/2013   Other chronic pulmonary heart diseases 05/27/2013   SIRS (systemic inflammatory response syndrome) ([place]) 04/14/2013   Anemia 04/14/2013   Dehydration 04/14/2013   Fever 04/14/2013   IBS (irritable bowel syndrome)    ADD (attention deficit disorder)    Lyme disease    Asthma    Hypothyroidism    Contact lens/glasses fitting    Hyperlipidemia 11/16/2012   Headache 02/05/2012   Benign neoplasm of colon 09/17/2011   Migraine without aura, not refractory 09/17/2011   Malignant neoplasm of  cervix uteri ([place]) 09/06/2009   Personal history of other specified conditions 09/06/2009   Thrombophlebitis of superficial veins of lower extremity 09/06/2009   Varicose veins of other specified sites 09/06/2009    PCP: Reynold Bowen, MD  REFERRING PROVIDER: Meredith Pel, MD   REFERRING DIAG: [phone] (ICD-10-CM) - Pain of right hip   THERAPY DIAG:  Pain in right hip - Plan: PT plan of care cert/re-cert  Muscle weakness (generalized) - Plan: PT plan of care cert/re-cert  Other symptoms and signs involving the musculoskeletal system - Plan: PT plan of care cert/re-cert  Rationale for Evaluation and Treatment Rehabilitation  ONSET DATE: 6 weeks ago  SUBJECTIVE:   SUBJECTIVE STATEMENT: Pt with hx of Rt hip pain, but new onset  of "snapping/popping" sensation for about 6 weeks.  She has adjusted her exercise routine.    PERTINENT HISTORY: ADHD, anxiety, asthma, hx breast cancer, hx cervical cancer, IBS  PAIN:  Are you having pain? Yes: NPRS scale: 2 currently; up to 5-6/10 Pain location: Rt hip (lateral/anterior) Pain description: aching, popping Aggravating factors: prolonged standing (in heels), lifting dog (~30#) Relieving factors: ice, hip/thigh brace  PRECAUTIONS: None  WEIGHT BEARING RESTRICTIONS No  FALLS:  Has patient fallen in last 6 months? 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 Mild osteoarthritis of both hips noted with some bursitis and tendinosis of the gluteus medius.   PATIENT SURVEYS:  09/18/22: FOTO 68 (predicted 77)  COGNITION: Overall cognitive status: Within functional limits for tasks assessed     MUSCLE LENGTH: Hamstrings: WNL bil  PALPATION: 09/18/22: trigger points noted in Rt piriformis  LOWER EXTREMITY ROM:  09/20/22: ROM WNL bil hips  LOWER EXTREMITY MMT:  MMT Right eval Left eval  Hip flexion 3+/5 4/5  Hip extension 4/5 4/5  Hip abduction 3/5 3/5  Hip adduction    Hip internal rotation 3+/5 4/5  Hip external rotation 3/5 4/5  Knee flexion 5/5 5/5  Knee extension 5/5 5/5   (Blank rows = not tested)   LOWER EXTREMITY SPECIAL TESTS:  Hip special tests: Saralyn Pilar (FABER) test: positive  (on Rt with ant hip pain)   GAIT: Independent; no significant deviations  TODAY'S TREATMENT: 09/18/22 TherEx See HEP - reviewed with pt and demonstrated  exercises  Manual STM with compression to Rt piriformis and glute med; skilled palpation and monitoring of soft tissue during DN  Trigger Point Dry-Needling  Treatment instructions: Expect mild to moderate muscle soreness. S/S of pneumothorax if dry needled over a lung field, and to seek immediate medical attention should they occur. Patient verbalized understanding of these instructions and education.  Patient Consent Given: Yes Education handout provided: Yes Muscles treated: Rt piriformis Electrical stimulation performed: No Parameters: N/A Treatment response/outcome: twitch response noted    PATIENT EDUCATION:  Education details: HEP, DN Person educated: Patient Education method: Consulting civil engineer, Demonstration, and Handouts Education comprehension: verbalized understanding, returned demonstration, and needs further education   HOME EXERCISE PROGRAM: Access Code: [email or web address] URL: [email or web address] Date: 09/18/2022 Prepared by: Faustino Congress  Exercises - Supine Bridge  - 1 x daily - 7 x weekly - 1-2 sets - 10 reps -  5 sec hold - Sidelying Hip Circles  - 1 x daily - 7 x weekly - 1-2 sets - 10 reps - Standing Hip Hiking  - 1 x daily - 7 x weekly - 1-2 sets - 10 reps - 5-10 sec hold - Alternating Single Leg Bridge  - 1 x daily - 7 x weekly - 1-2 sets - 10 reps - 2-3 sec hold  ASSESSMENT:  CLINICAL IMPRESSION: Patient is a 66 y.o. female who was seen today for physical therapy evaluation and treatment for Rt hip pain.  She demonstrates decreased strength and active trigger points affecting functional mobility.  She will benefit from PT to address deficits listed.  At this time pt will work on [employer] and will call if additional DN/PT needed.  OBJECTIVE IMPAIRMENTS decreased mobility, decreased strength, increased fascial restrictions, increased muscle spasms, and pain.   ACTIVITY LIMITATIONS sitting, standing, squatting, bed mobility, and locomotion  level  PARTICIPATION LIMITATIONS: driving, community activity, and occupation  PERSONAL FACTORS Past/current experiences and 3+ comorbidities: ADHD, anxiety, asthma, hx breast cancer, hx cervical cancer, IBS  are also affecting patient's functional outcome.   REHAB POTENTIAL: Good  CLINICAL DECISION MAKING: Evolving/moderate complexity  EVALUATION COMPLEXITY: Moderate   GOALS: Goals reviewed with patient? Yes   LONG TERM GOALS: Target date: 10/16/2022  Independent with final HEP Goal status: INITIAL  2.  FOTO score improved to 77 Goal status: INITIAL  3.  Rt hip strength improved to 4/5 for improved function and mobility Goal status: INIITAL  4.  Report pain < 2/10 with walking/exercising for improved function Goal status: INITIAL   PLAN: PT FREQUENCY: 1x/week (will see PRN up to 1x/wk)  PT DURATION: 4 weeks  PLANNED INTERVENTIONS: Therapeutic exercises, Therapeutic activity, Neuromuscular re-education, Patient/Family education, Self Care, Joint mobilization, Joint manipulation, Stair training, Aquatic Therapy, Dry Needling, Electrical stimulation, Spinal mobilization, Cryotherapy, Moist heat, Taping, Ionotophoresis '4mg'$ /ml Dexamethasone, Manual therapy, and Re-evaluation  PLAN FOR NEXT SESSION: repeat DN PRN, review/modify HEP PRN   Laureen Abrahams, PT, DPT 09/18/22 9:59 AM

## 2022-09-19 ENCOUNTER — Other Ambulatory Visit (HOSPITAL_COMMUNITY): Payer: Self-pay

## 2022-09-19 ENCOUNTER — Encounter: Payer: Self-pay | Admitting: Orthopedic Surgery

## 2022-09-19 ENCOUNTER — Other Ambulatory Visit: Payer: Self-pay | Admitting: Adult Health

## 2022-09-19 DIAGNOSIS — G4720 Circadian rhythm sleep disorder, unspecified type: Secondary | ICD-10-CM

## 2022-09-20 ENCOUNTER — Other Ambulatory Visit (HOSPITAL_COMMUNITY): Payer: Self-pay

## 2022-09-20 MED ORDER — DEXTROAMPHETAMINE SULFATE 10 MG PO TABS
20.0000 mg | ORAL_TABLET | Freq: Two times a day (BID) | ORAL | 0 refills | Status: DC
  Filled 2022-09-20: qty 360, 90d supply, fill #0

## 2022-09-23 ENCOUNTER — Other Ambulatory Visit (HOSPITAL_COMMUNITY): Payer: Self-pay

## 2022-09-23 ENCOUNTER — Encounter: Payer: Self-pay | Admitting: Physical Therapy

## 2022-09-24 ENCOUNTER — Ambulatory Visit: Payer: 59 | Admitting: Physical Therapy

## 2022-09-24 ENCOUNTER — Other Ambulatory Visit (HOSPITAL_COMMUNITY): Payer: Self-pay

## 2022-09-24 ENCOUNTER — Encounter: Payer: Self-pay | Admitting: Physical Therapy

## 2022-09-24 DIAGNOSIS — M6281 Muscle weakness (generalized): Secondary | ICD-10-CM

## 2022-09-24 DIAGNOSIS — M25551 Pain in right hip: Secondary | ICD-10-CM | POA: Diagnosis not present

## 2022-09-24 DIAGNOSIS — R29898 Other symptoms and signs involving the musculoskeletal system: Secondary | ICD-10-CM

## 2022-09-24 MED ORDER — ALBUTEROL SULFATE HFA 108 (90 BASE) MCG/ACT IN AERS
2.0000 | INHALATION_SPRAY | Freq: Four times a day (QID) | RESPIRATORY_TRACT | 3 refills | Status: DC | PRN
  Filled 2022-09-24: qty 6.7, 25d supply, fill #0
  Filled 2022-11-04: qty 6.7, 25d supply, fill #1

## 2022-09-24 NOTE — Therapy (Signed)
OUTPATIENT PHYSICAL THERAPY TREATMENT NOTE   Patient Name: Jane Gutierrez MRN: [id number] DOB:Feb 11, 1956, 66 y.o., female Today's Date: 09/24/2022  END OF SESSION:   PT End of Session - 09/24/22 1140     Visit Number 2    Number of Visits 4    Date for PT Re-Evaluation 10/16/22    Authorization Type [facility] $25 copay    PT Start Time 1135    PT Stop Time 1200    PT Time Calculation (min) 25 min    Activity Tolerance Patient tolerated treatment well    Behavior During Therapy WFL for tasks assessed/performed             Past Medical History:  Diagnosis Date   Abnormally small mouth    ADD (attention deficit disorder)    ADHD    Allergy    Anxiety    pt reported she doesn't have anxiety   Asthma    prn inhaler   Blood transfusion without reported diagnosis    Breast cancer ([place])    Dental crowns present    Difficulty swallowing pills    History of breast cancer    History of cervical cancer    History of palpitations    last echo 08/22/2014   Hypothyroidism    IBS (irritable bowel syndrome)    Past Surgical History:  Procedure Laterality Date   ABDOMINAL HYSTERECTOMY     ABDOMINOPLASTY  03/28/2004   AXILLARY SENTINEL NODE BIOPSY  12/31/2012   Procedure: AXILLARY SENTINEL NODE BIOPSY;  Surgeon: Rolm Bookbinder, MD;  Location: [place];  Service: General;  Laterality: Bilateral;  bilateral sentinel node   BRAIN SURGERY     removal benign frontal lobe cyst in [place] Bilateral ~ 2008   BREAST IMPLANT EXCHANGE Bilateral 12/11/2017   Procedure: BILATERAL IMPLANT EXCHANGE;  Surgeon: Wallace Going, DO;  Location: [place];  Service: Plastics;  Laterality: Bilateral;   BREAST RECONSTRUCTION WITH PLACEMENT OF TISSUE EXPANDER AND FLEX HD (ACELLULAR HYDRATED DERMIS)  12/31/2012   Procedure: BREAST RECONSTRUCTION WITH PLACEMENT OF TISSUE EXPANDER AND FLEX HD (ACELLULAR HYDRATED DERMIS);  Surgeon: Theodoro Kos,  DO;  Location: [place];  Service: Plastics;  Laterality: Bilateral;  bilateral immediate breast reconstruction with expanders and flex hd    CAPSULOTOMY Left 09/01/2014   Procedure: CAPSULOTOMY OF LEFT BREAST WITH LIPOFILLING FOR SYMMETRY;  Surgeon: Theodoro Kos, DO;  Location: [place];  Service: Plastics;  Laterality: Left;   CERVICAL CERCLAGE     DIAGNOSTIC LAPAROSCOPY  05/12/2003   right partial salpingectomy; lysis paraovarian adhesions left   DILATION AND CURETTAGE OF UTERUS  2005   ENDOMETRIAL FULGURATION  05/12/2003   FACIAL COSMETIC SURGERY  ~ 2010   FOOT SURGERY Bilateral 07/2012   GYNECOLOGIC CRYOSURGERY  1998   INCISION AND DRAINAGE OF WOUND Left 04/28/2013   Procedure: IRRIGATION AND DEBRIDEMENT LEFT BREAST WOUND, POSSIBLE CLOSURE OF WOUND WITH DRAIN PLACEMENT;  Surgeon: Theodoro Kos, DO;  Location: [place];  Service: Plastics;  Laterality: Left;   LATISSIMUS FLAP TO BREAST Left 09/29/2013   Procedure: LATISSIMUS FLAP TO BREAST WITH TISSUE EXPANDER PLACEMENT FOR LEFT BREAST RECONSTRUCTION;  Surgeon: Theodoro Kos, DO;  Location: [place];  Service: Plastics;  Laterality: Left;   LIPECTOMY Bilateral 03/28/2004   hip   LIPOSUCTION Bilateral 03/09/2014   Procedure: LIPOSUCTION ABDOMEN W/ LIPOFILLING LATERAL BILATERAL BREAST;  Surgeon: Theodoro Kos, DO;  Location: [facility]  [place];  Service: Plastics;  Laterality: Bilateral;  LIPOSUCTION NECK   LIPOSUCTION WITH LIPOFILLING Bilateral 09/01/2014   Procedure: LIPOSUCTION WITH LIPOFILLING;  Surgeon: Theodoro Kos, DO;  Location: [place];  Service: Plastics;  Laterality: Bilateral;   LYSIS OF ADHESION  03/28/2004   pelvic   MASTECTOMY     PORT-A-CATH REMOVAL Right 03/09/2014   Procedure: REMOVAL PORT-A-CATH;  Surgeon: Theodoro Kos, DO;  Location: [facility] [place];  Service: Plastics;  Laterality: Right;   PORTACATH PLACEMENT  01/27/2013   Procedure: INSERTION  PORT-A-CATH;  Surgeon: Rolm Bookbinder, MD;  Location: [place];  Service: General;  Laterality: Right;   RADIOFREQUENCY ABLATION NERVES  05/12/2003   uterosacral nerve ablation   REMOVAL OF BILATERAL TISSUE EXPANDERS WITH PLACEMENT OF BILATERAL BREAST IMPLANTS Bilateral 03/09/2014   Procedure: REMOVAL OF BILATERAL TISSUE EXPANDERS WITH PLACEMENT OF BILATERAL BREAST IMPLANTS;  Surgeon: Theodoro Kos, DO;  Location: [place];  Service: Plastics;  Laterality: Bilateral;   TEE WITHOUT CARDIOVERSION N/A 06/04/2013   Procedure: TRANSESOPHAGEAL ECHOCARDIOGRAM (TEE);  Surgeon: Jolaine Artist, MD;  Location[facility];  Service: Cardiovascular;  Laterality: N/A;   TISSUE EXPANDER PLACEMENT Left 06/16/2013   Procedure: LEFT BREAST TISSUE EXPANDER REMOVAL;  Surgeon: Theodoro Kos, DO;  Location: [place];  Service: Plastics;  Laterality: Left;   TISSUE EXPANDER PLACEMENT Left 09/29/2013   Procedure: TISSUE EXPANDER;  Surgeon: Theodoro Kos, DO;  Location: [place];  Service: Plastics;  Laterality: Left;   TOTAL ABDOMINAL HYSTERECTOMY W/ BILATERAL SALPINGOOPHORECTOMY  03/28/2004   TOTAL MASTECTOMY  12/31/2012   Procedure: TOTAL MASTECTOMY;  Surgeon: Rolm Bookbinder, MD;  Location: [place];  Service: General;  Laterality: Bilateral;  bilateral total mastectomies   Patient Active Problem List   Diagnosis Date Noted   Breast pain 05/28/2022   Encounter for counseling 10/16/2021   Hardening of the aorta (main artery of the heart) ([place]) 03/02/2021   Genital herpes simplex 02/22/2021   Family history of diseases of the blood and blood-forming organs and certain disorders involving the immune mechanism 10/21/2019   Polyneuropathy 10/21/2019   Rash and other nonspecific skin eruption 10/21/2019   Raynaud's disease 01/12/2019   Osteoarthritis 11/12/2018   Cough 10/27/2017   Pain in right hand 09/02/2017   Chronic sinusitis 08/18/2017    Wheezing 08/18/2017   Psychophysiologic insomnia 01/06/2017   Pain in left elbow 01/02/2016   Dyspnea 09/11/2015   Abnormal weight loss 08/17/2015   Alopecia 08/17/2015   Fatigue 08/17/2015   Joint pain 08/17/2015   Breast cancer of upper-outer quadrant of left female breast ([place]) 06/28/2015   Nontoxic single thyroid nodule 12/28/2014   Right hip pain 09/19/2014   Right knee pain 09/19/2014   Dyspnea on exertion 08/08/2014   Heart palpitations 08/08/2014   Acquired absence of breast and absent nipple, bilateral 03/09/2014   Myopathy 02/01/2014   Localized swelling, mass and lump, trunk 11/22/2013   Pre-operative cardiovascular examination 09/09/2013   Other chronic pulmonary heart diseases 05/27/2013   SIRS (systemic inflammatory response syndrome) ([place]) 04/14/2013   Anemia 04/14/2013   Dehydration 04/14/2013   Fever 04/14/2013   IBS (irritable bowel syndrome)    ADD (attention deficit disorder)    Lyme disease    Asthma    Hypothyroidism    Contact lens/glasses fitting    Hyperlipidemia 11/16/2012   Headache 02/05/2012   Benign neoplasm of colon 09/17/2011   Migraine without aura, not refractory 09/17/2011  Malignant neoplasm of cervix uteri ([place]) 09/06/2009   Personal history of other specified conditions 09/06/2009   Thrombophlebitis of superficial veins of lower extremity 09/06/2009   Varicose veins of other specified sites 09/06/2009     THERAPY DIAG:  Pain in right hip  Muscle weakness (generalized)  Other symptoms and signs involving the musculoskeletal system   PCP: Reynold Bowen, MD  REFERRING PROVIDER: Meredith Pel, MD   REFERRING DIAG: [phone] (ICD-10-CM) - Pain of right hip   EVAL THERAPY DIAG:  Pain in right hip - Plan: PT plan of care cert/re-cert  Muscle weakness (generalized) - Plan: PT plan of care cert/re-cert  Other symptoms and signs involving the musculoskeletal system - Plan: PT plan of care cert/re-cert  Rationale for  Evaluation and Treatment Rehabilitation  ONSET DATE: 6 weeks ago  SUBJECTIVE:   SUBJECTIVE STATEMENT: DN was helpful, but did notice some return of pain and pain into thigh.   PERTINENT HISTORY: ADHD, anxiety, asthma, hx breast cancer, hx cervical cancer, IBS  PAIN:  Are you having pain? Yes: NPRS scale: did not rate today/10 Pain location: Rt hip (lateral/anterior) Pain description: aching, popping Aggravating factors: prolonged standing (in heels), lifting dog (~30#) Relieving factors: ice, hip/thigh brace  PRECAUTIONS: None  WEIGHT BEARING RESTRICTIONS No  FALLS:  Has patient fallen in last 6 months? 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 Mild osteoarthritis of both hips noted with some bursitis and tendinosis of the gluteus medius.   PATIENT SURVEYS:  09/18/22: FOTO 68 (predicted 77)  COGNITION: Overall cognitive status: Within functional limits for tasks assessed     MUSCLE LENGTH: Hamstrings: WNL bil  PALPATION: 09/18/22: trigger points noted in Rt piriformis  LOWER EXTREMITY ROM:  09/20/22: ROM WNL bil hips  LOWER EXTREMITY MMT:  MMT Right eval Left eval  Hip flexion 3+/5 4/5  Hip extension 4/5 4/5  Hip abduction 3/5 3/5  Hip adduction    Hip internal rotation 3+/5 4/5  Hip external rotation 3/5 4/5  Knee flexion 5/5 5/5  Knee extension 5/5 5/5   (Blank rows = not tested)   LOWER EXTREMITY SPECIAL TESTS:  Hip special tests: Saralyn Pilar (FABER) test: positive  (on Rt with ant hip pain)   GAIT: Independent; no significant deviations  TODAY'S  TREATMENT: 09/24/22 TherEx Discussed pilates reformer options including sidelying exercises and sidelying in the straps to target lateral hip muscles.   Discussed use of old bike tire for LAD of Rt hip Trial of prone on elbows and discussed continuation at home if RLE symptoms return to see if extension based exercises help to centralize symptoms  Manual STM with compression to bil piriformis and glute med; skilled palpation and monitoring of soft tissue during DN Rt hip LAD 3x15 sec holds  Trigger Point Dry-Needling  Treatment instructions: Expect mild to moderate muscle soreness. S/S of pneumothorax if dry needled over a lung field, and to seek immediate medical attention should they occur. Patient verbalized understanding of these instructions and education.  Patient Consent Given: Yes Education handout provided: Yes Muscles treated: bil piriformis Electrical stimulation performed: No Parameters: N/A Treatment response/outcome: twitch response  noted   09/18/22 TherEx See HEP - reviewed with pt and demonstrated exercises  Manual STM with compression to Rt piriformis and glute med; skilled palpation and monitoring of soft tissue during DN  Trigger Point Dry-Needling  Treatment instructions: Expect mild to moderate muscle soreness. S/S of pneumothorax if dry needled over a lung field, and to seek immediate medical attention should they occur. Patient verbalized understanding of these instructions and education.  Patient Consent Given: Yes Education handout provided: Yes Muscles treated: Rt piriformis Electrical stimulation performed: No Parameters: N/A Treatment response/outcome: twitch response noted    PATIENT EDUCATION:  Education details: HEP, DN Person educated: Patient Education method: Consulting civil engineer, Demonstration, and Handouts Education comprehension: verbalized understanding, returned demonstration, and needs further education   HOME EXERCISE PROGRAM: Access Code:  [email or web address] URL: [email or web address] Date: 09/18/2022 Prepared by: Faustino Congress  Exercises - Supine Bridge  - 1 x daily - 7 x weekly - 1-2 sets - 10 reps - 5 sec hold - Sidelying Hip Circles  - 1 x daily - 7 x weekly - 1-2 sets - 10 reps - Standing Hip Hiking  - 1 x daily - 7 x weekly - 1-2 sets - 10 reps - 5-10 sec hold - Alternating Single Leg Bridge  - 1 x daily - 7 x weekly - 1-2 sets - 10 reps - 2-3 sec hold  ASSESSMENT:  CLINICAL IMPRESSION: Pt with positive response to DN last session, so repeated today but in bil piriformis.  Did discuss Pilates exercises and how to perform LAD at home.  She will benefit from PT to maximize function.  OBJECTIVE IMPAIRMENTS decreased mobility, decreased strength, increased fascial restrictions, increased muscle spasms, and pain.   ACTIVITY LIMITATIONS sitting, standing, squatting, bed mobility, and locomotion level  PARTICIPATION LIMITATIONS: driving, community activity, and occupation  PERSONAL FACTORS Past/current experiences and 3+ comorbidities: ADHD, anxiety, asthma, hx breast cancer, hx cervical cancer, IBS are also affecting patient's functional outcome.   REHAB POTENTIAL: Good  CLINICAL DECISION MAKING: Evolving/moderate complexity  EVALUATION COMPLEXITY: Moderate   GOALS: Goals reviewed with patient? Yes   LONG TERM GOALS: Target date: 10/16/2022  Independent with final HEP Goal status: INITIAL  2.  FOTO score improved to 77 Goal status: INITIAL  3.  Rt hip strength improved to 4/5 for improved function and mobility Goal status: INIITAL  4.  Report pain < 2/10 with walking/exercising for improved function Goal status: INITIAL   PLAN: PT FREQUENCY: 1x/week (will see PRN up to 1x/wk)  PT DURATION: 4 weeks  PLANNED INTERVENTIONS: Therapeutic exercises, Therapeutic activity, Neuromuscular re-education, Patient/Family education, Self Care, Joint mobilization, Joint manipulation, Stair training,  Aquatic Therapy, Dry Needling, Electrical stimulation, Spinal mobilization, Cryotherapy, Moist heat, Taping, Ionotophoresis '4mg'$ /ml Dexamethasone, Manual therapy, and Re-evaluation  PLAN FOR NEXT SESSION:  repeat DN PRN, review/modify HEP PRN    Laureen Abrahams, PT, DPT 09/24/22 12:19 PM

## 2022-09-30 ENCOUNTER — Other Ambulatory Visit (HOSPITAL_COMMUNITY): Payer: Self-pay

## 2022-10-01 ENCOUNTER — Other Ambulatory Visit (HOSPITAL_COMMUNITY): Payer: Self-pay

## 2022-10-01 MED ORDER — ESZOPICLONE 1 MG PO TABS
1.0000 mg | ORAL_TABLET | Freq: Every evening | ORAL | 0 refills | Status: DC | PRN
  Filled 2022-10-01: qty 30, 30d supply, fill #0

## 2022-10-08 ENCOUNTER — Ambulatory Visit (INDEPENDENT_AMBULATORY_CARE_PROVIDER_SITE_OTHER): Payer: 59 | Admitting: Physical Therapy

## 2022-10-08 ENCOUNTER — Encounter: Payer: Self-pay | Admitting: Physical Therapy

## 2022-10-08 DIAGNOSIS — R29898 Other symptoms and signs involving the musculoskeletal system: Secondary | ICD-10-CM

## 2022-10-08 DIAGNOSIS — M6281 Muscle weakness (generalized): Secondary | ICD-10-CM | POA: Diagnosis not present

## 2022-10-08 DIAGNOSIS — M25551 Pain in right hip: Secondary | ICD-10-CM

## 2022-10-08 NOTE — Therapy (Signed)
OUTPATIENT PHYSICAL THERAPY TREATMENT NOTE   Patient Name: MONIFA BLANCHETTE MRN: [id number] DOB:[birth year], 66 y.o., female Today's Date: 10/08/2022  END OF SESSION:   PT End of Session - 10/08/22 1303     Visit Number 3    Number of Visits 4    Date for PT Re-Evaluation 10/16/22    Authorization Type [facility] $25 copay    PT Start Time 1300    PT Stop Time 1315    PT Time Calculation (min) 15 min    Activity Tolerance Patient tolerated treatment well    Behavior During Therapy WFL for tasks assessed/performed              Past Medical History:  Diagnosis Date   Abnormally small mouth    ADD (attention deficit disorder)    ADHD    Allergy    Anxiety    pt reported she doesn't have anxiety   Asthma    prn inhaler   Blood transfusion without reported diagnosis    Breast cancer ([place])    Dental crowns present    Difficulty swallowing pills    History of breast cancer    History of cervical cancer    History of palpitations    last echo 08/22/2014   Hypothyroidism    IBS (irritable bowel syndrome)    Past Surgical History:  Procedure Laterality Date   ABDOMINAL HYSTERECTOMY     ABDOMINOPLASTY  03/28/2004   AXILLARY SENTINEL NODE BIOPSY  12/31/2012   Procedure: AXILLARY SENTINEL NODE BIOPSY;  Surgeon: Rolm Bookbinder, MD;  Location: [place];  Service: General;  Laterality: Bilateral;  bilateral sentinel node   BRAIN SURGERY     removal benign frontal lobe cyst in [place] Bilateral ~ 2008   BREAST IMPLANT EXCHANGE Bilateral 12/11/2017   Procedure: BILATERAL IMPLANT EXCHANGE;  Surgeon: Wallace Going, DO;  Location: [place];  Service: Plastics;  Laterality: Bilateral;   BREAST RECONSTRUCTION WITH PLACEMENT OF TISSUE EXPANDER AND FLEX HD (ACELLULAR HYDRATED DERMIS)  12/31/2012   Procedure: BREAST RECONSTRUCTION WITH PLACEMENT OF TISSUE EXPANDER AND FLEX HD (ACELLULAR HYDRATED DERMIS);  Surgeon: Theodoro Kos, DO;  Location: [place];  Service: Plastics;  Laterality: Bilateral;  bilateral immediate breast reconstruction with expanders and flex hd    CAPSULOTOMY Left 09/01/2014   Procedure: CAPSULOTOMY OF LEFT BREAST WITH LIPOFILLING FOR SYMMETRY;  Surgeon: Theodoro Kos, DO;  Location: [place];  Service: Plastics;  Laterality: Left;   CERVICAL CERCLAGE     DIAGNOSTIC LAPAROSCOPY  05/12/2003   right partial salpingectomy; lysis paraovarian adhesions left   DILATION AND CURETTAGE OF UTERUS  2005   ENDOMETRIAL FULGURATION  05/12/2003   FACIAL COSMETIC SURGERY  ~ 2010   FOOT SURGERY Bilateral 07/2012   GYNECOLOGIC CRYOSURGERY  1998   INCISION AND DRAINAGE OF WOUND Left 04/28/2013   Procedure: IRRIGATION AND DEBRIDEMENT LEFT BREAST WOUND, POSSIBLE CLOSURE OF WOUND WITH DRAIN PLACEMENT;  Surgeon: Theodoro Kos, DO;  Location: [place];  Service: Plastics;  Laterality: Left;   LATISSIMUS FLAP TO BREAST Left 09/29/2013   Procedure: LATISSIMUS FLAP TO BREAST WITH TISSUE EXPANDER PLACEMENT FOR LEFT BREAST RECONSTRUCTION;  Surgeon: Theodoro Kos, DO;  Location: [place];  Service: Plastics;  Laterality: Left;   LIPECTOMY Bilateral 03/28/2004   hip   LIPOSUCTION Bilateral 03/09/2014   Procedure: LIPOSUCTION ABDOMEN W/ LIPOFILLING LATERAL BILATERAL BREAST;  Surgeon: Theodoro Kos, DO;  Location:  [place];  Service: Plastics;  Laterality: Bilateral;  LIPOSUCTION NECK   LIPOSUCTION WITH LIPOFILLING Bilateral 09/01/2014   Procedure: LIPOSUCTION WITH LIPOFILLING;  Surgeon: Theodoro Kos, DO;  Location: [place];  Service: Plastics;  Laterality: Bilateral;   LYSIS OF ADHESION  03/28/2004   pelvic   MASTECTOMY     PORT-A-CATH REMOVAL Right 03/09/2014   Procedure: REMOVAL PORT-A-CATH;  Surgeon: Theodoro Kos, DO;  Location: [place];  Service: Plastics;  Laterality: Right;   PORTACATH PLACEMENT  01/27/2013   Procedure:  INSERTION PORT-A-CATH;  Surgeon: Rolm Bookbinder, MD;  Location: [place];  Service: General;  Laterality: Right;   RADIOFREQUENCY ABLATION NERVES  05/12/2003   uterosacral nerve ablation   REMOVAL OF BILATERAL TISSUE EXPANDERS WITH PLACEMENT OF BILATERAL BREAST IMPLANTS Bilateral 03/09/2014   Procedure: REMOVAL OF BILATERAL TISSUE EXPANDERS WITH PLACEMENT OF BILATERAL BREAST IMPLANTS;  Surgeon: Theodoro Kos, DO;  Location: [place];  Service: Plastics;  Laterality: Bilateral;   TEE WITHOUT CARDIOVERSION N/A 06/04/2013   Procedure: TRANSESOPHAGEAL ECHOCARDIOGRAM (TEE);  Surgeon: Jolaine Artist, MD;  Location[facility];  Service: Cardiovascular;  Laterality: N/A;   TISSUE EXPANDER PLACEMENT Left 06/16/2013   Procedure: LEFT BREAST TISSUE EXPANDER REMOVAL;  Surgeon: Theodoro Kos, DO;  Location: [place];  Service: Plastics;  Laterality: Left;   TISSUE EXPANDER PLACEMENT Left 09/29/2013   Procedure: TISSUE EXPANDER;  Surgeon: Theodoro Kos, DO;  Location: [place];  Service: Plastics;  Laterality: Left;   TOTAL ABDOMINAL HYSTERECTOMY W/ BILATERAL SALPINGOOPHORECTOMY  03/28/2004   TOTAL MASTECTOMY  12/31/2012   Procedure: TOTAL MASTECTOMY;  Surgeon: Rolm Bookbinder, MD;  Location: [place];  Service: General;  Laterality: Bilateral;  bilateral total mastectomies   Patient Active Problem List   Diagnosis Date Noted   Breast pain 05/28/2022   Encounter for counseling 10/16/2021   Hardening of the aorta (main artery of the heart) ([place]) 03/02/2021   Genital herpes simplex 02/22/2021   Family history of diseases of the blood and blood-forming organs and certain disorders involving the immune mechanism 10/21/2019   Polyneuropathy 10/21/2019   Rash and other nonspecific skin eruption 10/21/2019   Raynaud's disease 01/12/2019   Osteoarthritis 11/12/2018   Cough 10/27/2017   Pain in right hand 09/02/2017   Chronic sinusitis  08/18/2017   Wheezing 08/18/2017   Psychophysiologic insomnia 01/06/2017   Pain in left elbow 01/02/2016   Dyspnea 09/11/2015   Abnormal weight loss 08/17/2015   Alopecia 08/17/2015   Fatigue 08/17/2015   Joint pain 08/17/2015   Breast cancer of upper-outer quadrant of left female breast ([place]) 06/28/2015   Nontoxic single thyroid nodule 12/28/2014   Right hip pain 09/19/2014   Right knee pain 09/19/2014   Dyspnea on exertion 08/08/2014   Heart palpitations 08/08/2014   Acquired absence of breast and absent nipple, bilateral 03/09/2014   Myopathy 02/01/2014   Localized swelling, mass and lump, trunk 11/22/2013   Pre-operative cardiovascular examination 09/09/2013   Other chronic pulmonary heart diseases 05/27/2013   SIRS (systemic inflammatory response syndrome) ([place]) 04/14/2013   Anemia 04/14/2013   Dehydration 04/14/2013   Fever 04/14/2013   IBS (irritable bowel syndrome)    ADD (attention deficit disorder)    Lyme disease    Asthma    Hypothyroidism    Contact lens/glasses fitting    Hyperlipidemia 11/16/2012   Headache 02/05/2012   Benign neoplasm of colon 09/17/2011   Migraine without aura, not refractory 09/17/2011  Malignant neoplasm of cervix uteri ([place]) 09/06/2009   Personal history of other specified conditions 09/06/2009   Thrombophlebitis of superficial veins of lower extremity 09/06/2009   Varicose veins of other specified sites 09/06/2009     THERAPY DIAG:  Pain in right hip  Muscle weakness (generalized)  Other symptoms and signs involving the musculoskeletal system   PCP: Reynold Bowen, MD  REFERRING PROVIDER: Meredith Pel, MD   REFERRING DIAG: [id number] (ICD-10-CM) - Pain of right hip   EVAL THERAPY DIAG:  Pain in right hip - Plan: PT plan of care cert/re-cert  Muscle weakness (generalized) - Plan: PT plan of care cert/re-cert  Other symptoms and signs involving the musculoskeletal system - Plan: PT plan of care  cert/re-cert  Rationale for Evaluation and Treatment Rehabilitation  ONSET DATE: 6 weeks ago  SUBJECTIVE:   SUBJECTIVE STATEMENT: Doing well; has been sitting more since her husband just had surgery.  PERTINENT HISTORY: ADHD, anxiety, asthma, hx breast cancer, hx cervical cancer, IBS  PAIN:  Are you having pain? Yes: NPRS scale: 4-5/10 Pain location: Rt hip (lateral/anterior) Pain description: aching, popping Aggravating factors: prolonged standing (in heels), lifting dog (~30#) Relieving factors: ice, hip/thigh brace  PRECAUTIONS: None  WEIGHT BEARING RESTRICTIONS No  FALLS:  Has patient fallen in last 6 months? 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 Mild osteoarthritis of both hips noted with some bursitis and tendinosis of the gluteus medius.   PATIENT SURVEYS:  09/18/22: FOTO 68 (predicted 77)  COGNITION: Overall cognitive status: Within functional limits for tasks assessed     MUSCLE LENGTH: Hamstrings: WNL bil  PALPATION: 09/18/22: trigger points noted in Rt piriformis  LOWER EXTREMITY ROM:  09/20/22: ROM WNL bil hips  LOWER EXTREMITY MMT:  MMT Right eval Left eval  Hip flexion 3+/5 4/5  Hip extension 4/5 4/5  Hip abduction 3/5 3/5  Hip adduction    Hip internal rotation 3+/5 4/5  Hip external rotation 3/5 4/5  Knee flexion 5/5 5/5  Knee extension 5/5 5/5   (Blank rows = not tested)   LOWER EXTREMITY SPECIAL TESTS:  Hip special tests: Saralyn Pilar (FABER) test: positive  (on Rt with ant hip pain)   GAIT: Independent; no significant deviations  TODAY'S  TREATMENT: 10/08/22 Manual STM with compression to Lt piriformis and glute med; skilled palpation and monitoring of soft tissue during DN  Trigger Point Dry-Needling  Treatment instructions: Expect mild to moderate muscle soreness. S/S of pneumothorax if dry needled over a lung field, and to seek immediate medical attention should they occur. Patient verbalized understanding of these instructions and education.  Patient Consent Given: Yes Education handout provided: Yes Muscles treated: Lt piriformis Electrical stimulation performed: No Parameters: N/A Treatment response/outcome: twitch response noted   09/24/22 TherEx Discussed pilates reformer options including sidelying exercises and sidelying in the straps to target lateral hip muscles.   Discussed use of old bike tire for LAD of Rt hip Trial of prone on elbows and discussed continuation at home if RLE symptoms return to see if extension based exercises help to centralize symptoms  Manual STM with compression to bil piriformis and  glute med; skilled palpation and monitoring of soft tissue during DN Rt hip LAD 3x15 sec holds  Trigger Point Dry-Needling  Treatment instructions: Expect mild to moderate muscle soreness. S/S of pneumothorax if dry needled over a lung field, and to seek immediate medical attention should they occur. Patient verbalized understanding of these instructions and education.  Patient Consent Given: Yes Education handout provided: Yes Muscles treated: bil piriformis Electrical stimulation performed: No Parameters: N/A Treatment response/outcome: twitch response noted   09/18/22 TherEx See HEP - reviewed with pt and demonstrated exercises  Manual STM with compression to Rt piriformis and glute med; skilled palpation and monitoring of soft tissue during DN  Trigger Point Dry-Needling  Treatment instructions: Expect mild to moderate muscle soreness. S/S of pneumothorax if dry needled over a lung field,  and to seek immediate medical attention should they occur. Patient verbalized understanding of these instructions and education.  Patient Consent Given: Yes Education handout provided: Yes Muscles treated: Rt piriformis Electrical stimulation performed: No Parameters: N/A Treatment response/outcome: twitch response noted    PATIENT EDUCATION:  Education details: HEP, DN Person educated: Patient Education method: Consulting civil engineer, Demonstration, and Handouts Education comprehension: verbalized understanding, returned demonstration, and needs further education   HOME EXERCISE PROGRAM: Access Code: [email or web address] URL: [email or web address] Date: 09/18/2022 Prepared by: Faustino Congress  Exercises - Supine Bridge  - 1 x daily - 7 x weekly - 1-2 sets - 10 reps - 5 sec hold - Sidelying Hip Circles  - 1 x daily - 7 x weekly - 1-2 sets - 10 reps - Standing Hip Hiking  - 1 x daily - 7 x weekly - 1-2 sets - 10 reps - 5-10 sec hold - Alternating Single Leg Bridge  - 1 x daily - 7 x weekly - 1-2 sets - 10 reps - 2-3 sec hold  ASSESSMENT:  CLINICAL IMPRESSION: Overall pt doing well, has some mild increase in pain due to recent events leading to more sitting (husband had surgery) but exercises seem to be helping.  Repeated DN today to Lt piriformis with positive response after session.  Anticipate likely d/c or hold next visit.  OBJECTIVE IMPAIRMENTS decreased mobility, decreased strength, increased fascial restrictions, increased muscle spasms, and pain.   ACTIVITY LIMITATIONS sitting, standing, squatting, bed mobility, and locomotion level  PARTICIPATION LIMITATIONS: driving, community activity, and occupation  PERSONAL FACTORS Past/current experiences and 3+ comorbidities: ADHD, anxiety, asthma, hx breast cancer, hx cervical cancer, IBS are also affecting patient's functional outcome.   REHAB POTENTIAL: Good  CLINICAL DECISION MAKING: Evolving/moderate complexity  EVALUATION  COMPLEXITY: Moderate   GOALS: Goals reviewed with patient? Yes   LONG TERM GOALS: Target date: 10/16/2022  Independent with final HEP Goal status: INITIAL  2.  FOTO score improved to 77 Goal status: INITIAL  3.  Rt hip strength improved to 4/5 for improved function and mobility Goal status: INIITAL  4.  Report pain < 2/10 with walking/exercising for improved function Goal status: INITIAL   PLAN: PT FREQUENCY: 1x/week (will see PRN up to 1x/wk)  PT DURATION: 4 weeks  PLANNED INTERVENTIONS: Therapeutic exercises, Therapeutic activity, Neuromuscular re-education, Patient/Family education, Self Care, Joint mobilization, Joint manipulation, Stair training, Aquatic Therapy, Dry Needling, Electrical stimulation, Spinal mobilization, Cryotherapy, Moist heat, Taping, Ionotophoresis '4mg'$ /ml Dexamethasone, Manual therapy, and Re-evaluation  PLAN FOR NEXT SESSION:  likely d/c or hold, repeat DN PRN, review/modify HEP PRN    Laureen Abrahams, PT, DPT 10/08/22 1:28 PM

## 2022-10-09 ENCOUNTER — Encounter: Payer: Self-pay | Admitting: Interventional Cardiology

## 2022-10-09 ENCOUNTER — Other Ambulatory Visit (HOSPITAL_COMMUNITY): Payer: Self-pay

## 2022-10-09 MED ORDER — REPATHA SURECLICK 140 MG/ML ~~LOC~~ SOAJ
140.0000 mg | SUBCUTANEOUS | 12 refills | Status: DC
  Filled 2022-10-09: qty 2, 28d supply, fill #0
  Filled 2022-11-18: qty 2, 28d supply, fill #1
  Filled 2022-12-18: qty 2, 28d supply, fill #2

## 2022-10-10 ENCOUNTER — Other Ambulatory Visit (HOSPITAL_COMMUNITY): Payer: Self-pay

## 2022-10-11 ENCOUNTER — Other Ambulatory Visit (HOSPITAL_COMMUNITY): Payer: Self-pay

## 2022-10-22 ENCOUNTER — Encounter: Payer: Self-pay | Admitting: Physical Therapy

## 2022-10-22 ENCOUNTER — Ambulatory Visit (INDEPENDENT_AMBULATORY_CARE_PROVIDER_SITE_OTHER): Payer: 59 | Admitting: Physical Therapy

## 2022-10-22 DIAGNOSIS — R29898 Other symptoms and signs involving the musculoskeletal system: Secondary | ICD-10-CM

## 2022-10-22 DIAGNOSIS — M6281 Muscle weakness (generalized): Secondary | ICD-10-CM | POA: Diagnosis not present

## 2022-10-22 DIAGNOSIS — M25551 Pain in right hip: Secondary | ICD-10-CM | POA: Diagnosis not present

## 2022-10-22 NOTE — Therapy (Addendum)
OUTPATIENT PHYSICAL THERAPY TREATMENT NOTE DISCHARGE SUMMARY   Patient Name: Jane Gutierrez MRN: [id number] DOB:1956/02/29, 66 y.o., female Today's Date: 10/22/2022  END OF SESSION:   PT End of Session - 10/22/22 0801     Visit Number 4    Number of Visits 4    Date for PT Re-Evaluation 10/16/22    Authorization Type [facility] $25 copay    PT Start Time 0800    PT Stop Time 0813    PT Time Calculation (min) 13 min    Activity Tolerance Patient tolerated treatment well    Behavior During Therapy [facility] for tasks assessed/performed               Past Medical History:  Diagnosis Date   Abnormally small mouth    ADD (attention deficit disorder)    ADHD    Allergy    Anxiety    pt reported she doesn't have anxiety   Asthma    prn inhaler   Blood transfusion without reported diagnosis    Breast cancer ([place])    Dental crowns present    Difficulty swallowing pills    History of breast cancer    History of cervical cancer    History of palpitations    last echo 08/22/2014   Hypothyroidism    IBS (irritable bowel syndrome)    Past Surgical History:  Procedure Laterality Date   ABDOMINAL HYSTERECTOMY     ABDOMINOPLASTY  03/28/2004   AXILLARY SENTINEL NODE BIOPSY  12/31/2012   Procedure: AXILLARY SENTINEL NODE BIOPSY;  Surgeon: Rolm Bookbinder, MD;  Location: [place];  Service: General;  Laterality: Bilateral;  bilateral sentinel node   BRAIN SURGERY     removal benign frontal lobe cyst in [place] Bilateral ~ 2008   BREAST IMPLANT EXCHANGE Bilateral 12/11/2017   Procedure: BILATERAL IMPLANT EXCHANGE;  Surgeon: Wallace Going, DO;  Location: [place];  Service: Plastics;  Laterality: Bilateral;   BREAST RECONSTRUCTION WITH PLACEMENT OF TISSUE EXPANDER AND FLEX HD (ACELLULAR HYDRATED DERMIS)  12/31/2012   Procedure: BREAST RECONSTRUCTION WITH PLACEMENT OF TISSUE EXPANDER AND FLEX HD (ACELLULAR HYDRATED DERMIS);   Surgeon: Theodoro Kos, DO;  Location: [place];  Service: Plastics;  Laterality: Bilateral;  bilateral immediate breast reconstruction with expanders and flex hd    CAPSULOTOMY Left 09/01/2014   Procedure: CAPSULOTOMY OF LEFT BREAST WITH LIPOFILLING FOR SYMMETRY;  Surgeon: Theodoro Kos, DO;  Location: [place];  Service: Plastics;  Laterality: Left;   CERVICAL CERCLAGE     DIAGNOSTIC LAPAROSCOPY  05/12/2003   right partial salpingectomy; lysis paraovarian adhesions left   DILATION AND CURETTAGE OF UTERUS  2005   ENDOMETRIAL FULGURATION  05/12/2003   FACIAL COSMETIC SURGERY  ~ 2010   FOOT SURGERY Bilateral 07/2012   GYNECOLOGIC CRYOSURGERY  1998   INCISION AND DRAINAGE OF WOUND Left 04/28/2013   Procedure: IRRIGATION AND DEBRIDEMENT LEFT BREAST WOUND, POSSIBLE CLOSURE OF WOUND WITH DRAIN PLACEMENT;  Surgeon: Theodoro Kos, DO;  Location: [place];  Service: Plastics;  Laterality: Left;   LATISSIMUS FLAP TO BREAST Left 09/29/2013   Procedure: LATISSIMUS FLAP TO BREAST WITH TISSUE EXPANDER PLACEMENT FOR LEFT BREAST RECONSTRUCTION;  Surgeon: Theodoro Kos, DO;  Location: [place];  Service: Plastics;  Laterality: Left;   LIPECTOMY Bilateral 03/28/2004   hip   LIPOSUCTION Bilateral 03/09/2014   Procedure: LIPOSUCTION ABDOMEN W/ LIPOFILLING LATERAL BILATERAL BREAST;  Surgeon: Theodoro Kos,  DO;  Location: [place];  Service: Plastics;  Laterality: Bilateral;  LIPOSUCTION NECK   LIPOSUCTION WITH LIPOFILLING Bilateral 09/01/2014   Procedure: LIPOSUCTION WITH LIPOFILLING;  Surgeon: Theodoro Kos, DO;  Location: [place];  Service: Plastics;  Laterality: Bilateral;   LYSIS OF ADHESION  03/28/2004   pelvic   MASTECTOMY     PORT-A-CATH REMOVAL Right 03/09/2014   Procedure: REMOVAL PORT-A-CATH;  Surgeon: Theodoro Kos, DO;  Location: [place];  Service: Plastics;  Laterality: Right;   PORTACATH PLACEMENT  01/27/2013    Procedure: INSERTION PORT-A-CATH;  Surgeon: Rolm Bookbinder, MD;  Location: [place];  Service: General;  Laterality: Right;   RADIOFREQUENCY ABLATION NERVES  05/12/2003   uterosacral nerve ablation   REMOVAL OF BILATERAL TISSUE EXPANDERS WITH PLACEMENT OF BILATERAL BREAST IMPLANTS Bilateral 03/09/2014   Procedure: REMOVAL OF BILATERAL TISSUE EXPANDERS WITH PLACEMENT OF BILATERAL BREAST IMPLANTS;  Surgeon: Theodoro Kos, DO;  Location: [place];  Service: Plastics;  Laterality: Bilateral;   TEE WITHOUT CARDIOVERSION N/A 06/04/2013   Procedure: TRANSESOPHAGEAL ECHOCARDIOGRAM (TEE);  Surgeon: Jolaine Artist, MD;  [facility];  Service: Cardiovascular;  Laterality: N/A;   TISSUE EXPANDER PLACEMENT Left 06/16/2013   Procedure: LEFT BREAST TISSUE EXPANDER REMOVAL;  Surgeon: Theodoro Kos, DO;  Location: [place];  Service: Plastics;  Laterality: Left;   TISSUE EXPANDER PLACEMENT Left 09/29/2013   Procedure: TISSUE EXPANDER;  Surgeon: Theodoro Kos, DO;  Location: [place];  Service: Plastics;  Laterality: Left;   TOTAL ABDOMINAL HYSTERECTOMY W/ BILATERAL SALPINGOOPHORECTOMY  03/28/2004   TOTAL MASTECTOMY  12/31/2012   Procedure: TOTAL MASTECTOMY;  Surgeon: Rolm Bookbinder, MD;  Location: [place];  Service: General;  Laterality: Bilateral;  bilateral total mastectomies   Patient Active Problem List   Diagnosis Date Noted   Breast pain 05/28/2022   Encounter for counseling 10/16/2021   Hardening of the aorta (main artery of the heart) ([place]) 03/02/2021   Genital herpes simplex 02/22/2021   Family history of diseases of the blood and blood-forming organs and certain disorders involving the immune mechanism 10/21/2019   Polyneuropathy 10/21/2019   Rash and other nonspecific skin eruption 10/21/2019   Raynaud's disease 01/12/2019   Osteoarthritis 11/12/2018   Cough 10/27/2017   Pain in right hand 09/02/2017   Chronic  sinusitis 08/18/2017   Wheezing 08/18/2017   Psychophysiologic insomnia 01/06/2017   Pain in left elbow 01/02/2016   Dyspnea 09/11/2015   Abnormal weight loss 08/17/2015   Alopecia 08/17/2015   Fatigue 08/17/2015   Joint pain 08/17/2015   Breast cancer of upper-outer quadrant of left female breast ([place]) 06/28/2015   Nontoxic single thyroid nodule 12/28/2014   Right hip pain 09/19/2014   Right knee pain 09/19/2014   Dyspnea on exertion 08/08/2014   Heart palpitations 08/08/2014   Acquired absence of breast and absent nipple, bilateral 03/09/2014   Myopathy 02/01/2014   Localized swelling, mass and lump, trunk 11/22/2013   Pre-operative cardiovascular examination 09/09/2013   Other chronic pulmonary heart diseases 05/27/2013   SIRS (systemic inflammatory response syndrome) ([place]) 04/14/2013   Anemia 04/14/2013   Dehydration 04/14/2013   Fever 04/14/2013   IBS (irritable bowel syndrome)    ADD (attention deficit disorder)    Lyme disease    Asthma    Hypothyroidism    Contact lens/glasses fitting    Hyperlipidemia 11/16/2012   Headache 02/05/2012   Benign neoplasm of colon 09/17/2011   Migraine without aura,  not refractory 09/17/2011   Malignant neoplasm of cervix uteri ([place]) 09/06/2009   Personal history of other specified conditions 09/06/2009   Thrombophlebitis of superficial veins of lower extremity 09/06/2009   Varicose veins of other specified sites 09/06/2009     THERAPY DIAG:  Pain in right hip  Muscle weakness (generalized)  Other symptoms and signs involving the musculoskeletal system   PCP: Reynold Bowen, MD  REFERRING PROVIDER: Meredith Pel, MD   REFERRING DIAG: [phone] (ICD-10-CM) - Pain of right hip   EVAL THERAPY DIAG:  Pain in right hip - Plan: PT plan of care cert/re-cert  Muscle weakness (generalized) - Plan: PT plan of care cert/re-cert  Other symptoms and signs involving the musculoskeletal system - Plan: PT plan of care  cert/re-cert  Rationale for Evaluation and Treatment Rehabilitation  ONSET DATE: 6 weeks ago  SUBJECTIVE:   SUBJECTIVE STATEMENT: Small little bit of pain, otherwise feels good.    PERTINENT HISTORY: ADHD, anxiety, asthma, hx breast cancer, hx cervical cancer, IBS  PAIN:  Are you having pain? Yes: NPRS scale: 0-2/10 Pain location: Rt hip (lateral/anterior) Pain description: aching, popping Aggravating factors: prolonged standing (in heels), lifting dog (~30#) Relieving factors: ice, hip/thigh brace  PRECAUTIONS: None  WEIGHT BEARING RESTRICTIONS No  FALLS:  Has patient fallen in last 6 months? 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 Mild osteoarthritis of both hips noted with some bursitis and tendinosis of the gluteus medius.   PATIENT SURVEYS:  09/18/22: FOTO 68 (predicted 77) 10/22/22: FOTO 79  COGNITION: Overall cognitive status: Within functional limits for tasks assessed     MUSCLE LENGTH: Hamstrings: WNL bil  PALPATION: 09/18/22: trigger points noted in Rt piriformis  LOWER EXTREMITY ROM:  09/20/22: ROM WNL bil hips  LOWER EXTREMITY MMT:  MMT Right eval Left eval  Hip flexion 3+/5 4/5  Hip extension 4/5 4/5  Hip abduction 3/5 3/5  Hip adduction    Hip internal rotation 3+/5 4/5  Hip external rotation 3/5 4/5  Knee flexion 5/5 5/5  Knee extension 5/5 5/5   (Blank rows = not tested)   LOWER EXTREMITY SPECIAL TESTS:  Hip special tests: Saralyn Pilar (FABER) test: positive  (on Rt with ant hip pain)   GAIT: Independent; no significant deviations  TODAY'S  TREATMENT: 10/22/22 Manual STM with compression to Lt piriformis and glute med; skilled palpation and monitoring of soft tissue during DN  Trigger Point Dry-Needling  Treatment instructions: Expect mild to moderate muscle soreness. S/S of pneumothorax if dry needled over a lung field, and to seek immediate medical attention should they occur. Patient verbalized understanding of these instructions and education.  Patient Consent Given: Yes Education handout provided: Yes Muscles treated: Lt piriformis Electrical stimulation performed: No Parameters: N/A Treatment response/outcome: twitch response noted   10/08/22 Manual STM with compression to Lt piriformis and glute med; skilled palpation and monitoring of soft tissue during DN  Trigger Point Dry-Needling  Treatment instructions: Expect mild to moderate muscle soreness. S/S of pneumothorax if dry needled over a lung field, and to seek immediate medical attention should they occur. Patient verbalized understanding of these instructions and  education.  Patient Consent Given: Yes Education handout provided: Yes Muscles treated: Lt piriformis Electrical stimulation performed: No Parameters: N/A Treatment response/outcome: twitch response noted   09/24/22 TherEx Discussed pilates reformer options including sidelying exercises and sidelying in the straps to target lateral hip muscles.   Discussed use of old bike tire for LAD of Rt hip Trial of prone on elbows and discussed continuation at home if RLE symptoms return to see if extension based exercises help to centralize symptoms  Manual STM with compression to bil piriformis and glute med; skilled palpation and monitoring of soft tissue during DN Rt hip LAD 3x15 sec holds  Trigger Point Dry-Needling  Treatment instructions: Expect mild to moderate muscle soreness. S/S of pneumothorax if dry needled over a lung field, and to seek immediate medical attention should they occur. Patient  verbalized understanding of these instructions and education.  Patient Consent Given: Yes Education handout provided: Yes Muscles treated: bil piriformis Electrical stimulation performed: No Parameters: N/A Treatment response/outcome: twitch response noted   09/18/22 TherEx See HEP - reviewed with pt and demonstrated exercises  Manual STM with compression to Rt piriformis and glute med; skilled palpation and monitoring of soft tissue during DN  Trigger Point Dry-Needling  Treatment instructions: Expect mild to moderate muscle soreness. S/S of pneumothorax if dry needled over a lung field, and to seek immediate medical attention should they occur. Patient verbalized understanding of these instructions and education.  Patient Consent Given: Yes Education handout provided: Yes Muscles treated: Rt piriformis Electrical stimulation performed: No Parameters: N/A Treatment response/outcome: twitch response noted    PATIENT EDUCATION:  Education details: HEP, DN Person educated: Patient Education method: Consulting civil engineer, Demonstration, and Handouts Education comprehension: verbalized understanding, returned demonstration, and needs further education   HOME EXERCISE PROGRAM: Access Code: [email or web address] URL: [email or web address] Date: 09/18/2022 Prepared by: Faustino Congress  Exercises - Supine Bridge  - 1 x daily - 7 x weekly - 1-2 sets - 10 reps - 5 sec hold - Sidelying Hip Circles  - 1 x daily - 7 x weekly - 1-2 sets - 10 reps - Standing Hip Hiking  - 1 x daily - 7 x weekly - 1-2 sets - 10 reps - 5-10 sec hold - Alternating Single Leg Bridge  - 1 x daily - 7 x weekly - 1-2 sets - 10 reps - 2-3 sec hold  ASSESSMENT:  CLINICAL IMPRESSION: Positive twitch response today, and pt meeting 3/4 LTGs.  At this time plan to hold PT and pt will call if additional visits are needed.  OBJECTIVE IMPAIRMENTS decreased mobility, decreased strength, increased fascial restrictions,  increased muscle spasms, and pain.   ACTIVITY LIMITATIONS sitting, standing, squatting, bed mobility, and locomotion level  PARTICIPATION LIMITATIONS: driving, community activity, and occupation  PERSONAL FACTORS Past/current experiences and 3+ comorbidities: ADHD, anxiety, asthma, hx breast cancer, hx cervical cancer, IBS are also affecting patient's functional outcome.   REHAB POTENTIAL: Good  CLINICAL DECISION MAKING: Evolving/moderate complexity  EVALUATION COMPLEXITY: Moderate   GOALS: Goals reviewed with patient? Yes   LONG TERM GOALS: Target date: 10/16/2022  Independent with final HEP Goal status: MET 10/22/22  2.  FOTO score improved to 77 Goal status: MET 10/22/22  3.  Rt hip strength improved to 4/5 for improved function and mobility Goal status: ONGOING 10/22/22  4.  Report pain < 2/10 with walking/exercising for improved function Goal status: MET 10/22/22   PLAN: PT FREQUENCY: 1x/week (will see PRN up to 1x/wk)  PT DURATION: 4 weeks  PLANNED INTERVENTIONS: Therapeutic exercises, Therapeutic activity, Neuromuscular re-education, Patient/Family education, Self Care, Joint mobilization, Joint manipulation, Stair training, Aquatic Therapy, Dry Needling, Electrical stimulation, Spinal mobilization, Cryotherapy, Moist heat, Taping, Ionotophoresis 39m/ml Dexamethasone, Manual therapy, and Re-evaluation  PLAN FOR NEXT SESSION:  hold PT x 30 days, repeat DN PRN, review/modify HEP PRN    SLaureen Abrahams PT, DPT 10/22/22 8:18 AM       PHYSICAL THERAPY DISCHARGE SUMMARY  Visits from Start of Care: 4  Current functional level related to goals / functional outcomes: See above   Remaining deficits: See above   Education / Equipment: HEP, DN   Patient agrees to discharge. Patient goals were met. Patient is being discharged due to meeting the stated rehab goals.  SLaureen Abrahams PT, DPT 12/10/22 1:48 PM  [facility] [address] [address]Phone: [phone]  Fax:  [phone]

## 2022-10-25 ENCOUNTER — Other Ambulatory Visit (HOSPITAL_COMMUNITY): Payer: Self-pay

## 2022-10-25 ENCOUNTER — Telehealth: Payer: 59 | Admitting: Emergency Medicine

## 2022-10-25 DIAGNOSIS — K219 Gastro-esophageal reflux disease without esophagitis: Secondary | ICD-10-CM

## 2022-10-25 MED ORDER — OMEPRAZOLE 20 MG PO CPDR
20.0000 mg | DELAYED_RELEASE_CAPSULE | Freq: Every day | ORAL | 0 refills | Status: DC
  Filled 2022-10-25: qty 30, 30d supply, fill #0

## 2022-10-25 NOTE — Progress Notes (Signed)
E-Visit for Heartburn  We are sorry that you are not feeling well.  Here is how we plan to help!  Please do keep trying to contact Dr. Lorie Apley office for an appointment. Don't take both pepcid and the omeprazole I am recommending today. It is ok to continue using antacids if needed.   Based on what you shared with me it looks like you most likely have Gastroesophageal Reflux Disease (GERD)  Gastroesophageal reflux disease (GERD) happens when acid from your stomach flows up into the esophagus.  When acid comes in contact with the esophagus, the acid causes sorenss (inflammation) in the esophagus.  Over time, GERD may create small holes (ulcers) in the lining of the esophagus.  I have prescribed Omeprazole 20 mg one by mouth daily until you follow up with a provider.  Your symptoms should improve in the next day or two.  You can use antacids as needed until symptoms resolve.  Call us if your heartburn worsens, you have trouble swallowing, weight loss, spitting up blood or recurrent vomiting.  Home Care: May include lifestyle changes such as weight loss, quitting smoking and alcohol consumption Avoid foods and drinks that make your symptoms worse, such as: Caffeine or alcoholic drinks Chocolate Peppermint or mint flavorings Garlic and onions Spicy foods Citrus fruits, such as oranges, lemons, or limes Tomato-based foods such as sauce, chili, salsa and pizza Fried and fatty foods Avoid lying down for 3 hours prior to your bedtime or prior to taking a nap Eat small, frequent meals instead of a large meals Wear loose-fitting clothing.  Do not wear anything tight around your waist that causes pressure on your stomach. Raise the head of your bed 6 to 8 inches with wood blocks to help you sleep.  Extra pillows will not help.  Seek Help Right Away If: You have pain in your arms, neck, jaw, teeth or back Your pain increases or changes in intensity or duration You develop nausea, vomiting or  sweating (diaphoresis) You develop shortness of breath or you faint Your vomit is green, yellow, black or looks like coffee grounds or blood Your stool is red, bloody or black  These symptoms could be signs of other problems, such as heart disease, gastric bleeding or esophageal bleeding.  Make sure you : Understand these instructions. Will watch your condition. Will get help right away if you are not doing well or get worse.  Your e-visit answers were reviewed by a board certified advanced clinical practitioner to complete your personal care plan.  Depending on the condition, your plan could have included both over the counter or prescription medications.  If there is a problem please reply  once you have received a response from your provider.  Your safety is important to [place].  If you have drug allergies check your prescription carefully.    You can use MyChart to ask questions about today's visit, request a non-urgent call back, or ask for a work or school excuse for 24 hours related to this e-Visit. If it has been greater than 24 hours you will need to follow up with your provider, or enter a new e-Visit to address those concerns.  You will get an e-mail in the next two days asking about your experience.  I Kiera that your e-visit has been valuable and will speed your recovery. Thank you for using e-visits.  I have spent 5 minutes in review of e-visit questionnaire, review and updating patient chart, medical decision making and response to  patient.   Willeen Cass, PhD, FNP-BC

## 2022-10-30 DIAGNOSIS — Z13820 Encounter for screening for osteoporosis: Secondary | ICD-10-CM | POA: Diagnosis not present

## 2022-10-31 ENCOUNTER — Other Ambulatory Visit (HOSPITAL_COMMUNITY): Payer: Self-pay

## 2022-10-31 DIAGNOSIS — K219 Gastro-esophageal reflux disease without esophagitis: Secondary | ICD-10-CM | POA: Diagnosis not present

## 2022-10-31 DIAGNOSIS — K5900 Constipation, unspecified: Secondary | ICD-10-CM | POA: Diagnosis not present

## 2022-10-31 DIAGNOSIS — R1310 Dysphagia, unspecified: Secondary | ICD-10-CM | POA: Diagnosis not present

## 2022-10-31 DIAGNOSIS — K9041 Non-celiac gluten sensitivity: Secondary | ICD-10-CM | POA: Diagnosis not present

## 2022-10-31 MED ORDER — OMEPRAZOLE 40 MG PO CPDR
40.0000 mg | DELAYED_RELEASE_CAPSULE | Freq: Every day | ORAL | 4 refills | Status: DC
  Filled 2022-10-31: qty 90, 90d supply, fill #0
  Filled 2022-12-02 – 2023-01-29 (×3): qty 90, 90d supply, fill #1
  Filled 2023-05-02: qty 90, 90d supply, fill #2

## 2022-11-01 ENCOUNTER — Other Ambulatory Visit (HOSPITAL_COMMUNITY): Payer: Self-pay

## 2022-11-04 ENCOUNTER — Other Ambulatory Visit (HOSPITAL_COMMUNITY): Payer: Self-pay

## 2022-11-04 ENCOUNTER — Other Ambulatory Visit: Payer: Self-pay | Admitting: Adult Health

## 2022-11-04 DIAGNOSIS — G4720 Circadian rhythm sleep disorder, unspecified type: Secondary | ICD-10-CM

## 2022-11-04 MED ORDER — ESZOPICLONE 1 MG PO TABS
1.0000 mg | ORAL_TABLET | Freq: Every evening | ORAL | 0 refills | Status: DC | PRN
  Filled 2022-11-04: qty 30, 30d supply, fill #0

## 2022-11-04 MED ORDER — VALACYCLOVIR HCL 500 MG PO TABS
500.0000 mg | ORAL_TABLET | Freq: Two times a day (BID) | ORAL | 3 refills | Status: DC
  Filled 2022-11-04: qty 90, 45d supply, fill #0

## 2022-11-05 ENCOUNTER — Ambulatory Visit: Payer: 59 | Admitting: Plastic Surgery

## 2022-11-06 ENCOUNTER — Ambulatory Visit (INDEPENDENT_AMBULATORY_CARE_PROVIDER_SITE_OTHER): Payer: Self-pay | Admitting: Plastic Surgery

## 2022-11-06 DIAGNOSIS — Z719 Counseling, unspecified: Secondary | ICD-10-CM

## 2022-11-06 NOTE — Progress Notes (Signed)
Preoperative Dx: facial hyperpigmentation  Postoperative Dx:  same  Procedure: laser to face   Anesthesia: none  Description of Procedure:  Risks and complications were explained to the patient. Consent was confirmed and signed. Eye protection was placed. Time out was called and all information was confirmed to be correct. The area  area was prepped with alcohol and wiped dry. The BBl laser was set at 515 nm at 11 J/cm2. The forehead and left cheek were lasered. The patient tolerated the procedure well and there were no complications. The patient is to follow up in 4 weeks.

## 2022-11-07 ENCOUNTER — Other Ambulatory Visit: Payer: 59 | Admitting: Plastic Surgery

## 2022-11-08 ENCOUNTER — Ambulatory Visit: Payer: 59 | Attending: Interventional Cardiology

## 2022-11-08 ENCOUNTER — Ambulatory Visit: Payer: 59 | Admitting: Plastic Surgery

## 2022-11-08 ENCOUNTER — Other Ambulatory Visit: Payer: 59 | Admitting: Plastic Surgery

## 2022-11-08 DIAGNOSIS — E785 Hyperlipidemia, unspecified: Secondary | ICD-10-CM

## 2022-11-09 LAB — HEPATIC FUNCTION PANEL
ALT: 19 IU/L (ref 0–32)
AST: 25 IU/L (ref 0–40)
Albumin: 4.3 g/dL (ref 3.9–4.9)
Alkaline Phosphatase: 56 IU/L (ref 44–121)
Bilirubin Total: 0.4 mg/dL (ref 0.0–1.2)
Bilirubin, Direct: 0.13 mg/dL (ref 0.00–0.40)
Total Protein: 6.4 g/dL (ref 6.0–8.5)

## 2022-11-09 LAB — LIPID PANEL
Chol/HDL Ratio: 2.9 ratio (ref 0.0–4.4)
Cholesterol, Total: 175 mg/dL (ref 100–199)
HDL: 60 mg/dL (ref >39)
LDL Chol Calc (NIH): 103 mg/dL — ABNORMAL HIGH (ref 0–99)
Triglycerides: 61 mg/dL (ref 0–149)
VLDL Cholesterol Cal: 12 mg/dL (ref 5–40)

## 2022-11-11 ENCOUNTER — Ambulatory Visit: Payer: 59

## 2022-11-12 ENCOUNTER — Encounter: Payer: Self-pay | Admitting: Pharmacist Clinician (PhC)/ Clinical Pharmacy Specialist

## 2022-11-18 ENCOUNTER — Other Ambulatory Visit (HOSPITAL_COMMUNITY): Payer: Self-pay

## 2022-11-18 DIAGNOSIS — Z719 Counseling, unspecified: Secondary | ICD-10-CM

## 2022-11-21 ENCOUNTER — Other Ambulatory Visit (HOSPITAL_COMMUNITY): Payer: Self-pay

## 2022-11-24 ENCOUNTER — Telehealth: Payer: 59 | Admitting: Family

## 2022-11-24 DIAGNOSIS — R399 Unspecified symptoms and signs involving the genitourinary system: Secondary | ICD-10-CM

## 2022-11-24 MED ORDER — CEPHALEXIN 500 MG PO CAPS: 500.0000 mg | ORAL_CAPSULE | Freq: Two times a day (BID) | ORAL | 0 refills | Status: DC

## 2022-11-24 NOTE — Progress Notes (Signed)

## 2022-11-26 ENCOUNTER — Encounter: Payer: Self-pay | Admitting: Plastic Surgery

## 2022-11-26 ENCOUNTER — Ambulatory Visit: Payer: Self-pay | Admitting: Plastic Surgery

## 2022-11-26 VITALS — BP 154/83 | HR 102

## 2022-11-26 DIAGNOSIS — Z719 Counseling, unspecified: Secondary | ICD-10-CM

## 2022-11-26 NOTE — Progress Notes (Signed)
Botulinum Toxin and Filler Injection Procedure Note  Procedure: Cosmetic botulinum toxin and Filler administration  Pre-operative Diagnosis: Dynamic rhytides   Post-operative Diagnosis: Same  Complications:  None  Brief history: The patient desires botulinum toxin injection of her forehead. I discussed with the patient this proposed procedure of botulinum toxin injections, which is customized depending on the particular needs of the patient. It is performed on facial rhytids as a temporary correction. The alternatives were discussed with the patient. The risks were addressed including bleeding, scarring, infection, damage to deeper structures, asymmetry, and chronic pain, which may occur infrequently after a procedure. The individual's choice to undergo a surgical procedure is based on the comparison of risks to potential benefits. Other risks include unsatisfactory results, brow ptosis, eyelid ptosis, allergic reaction, temporary paralysis, which should go away with time, bruising, blurring disturbances and delayed healing. Botulinum toxin injections do not arrest the aging process or produce permanent tightening of the eyelid.  Operative intervention maybe necessary to maintain the results of a blepharoplasty or botulinum toxin. The patient understands and wishes to proceed.  Procedure: The area was prepped with alcohol and dried with a clean gauze. Using a clean technique, the botulinum toxin was diluted with 1.25 cc of preservative-free normal saline which was slowly injected with an 18 gauge needle in a tuberculin syringes.  A 32 gauge needles were then used to inject the botulinum toxin. This mixture allow for an aliquot of 4 units per 0.1 cc in each injection site.    Subsequently the mixture was injected in the glabellar area. A total of 16 Units of botulinum toxin was used. The forehead and glabellar area was injected with care to inject intramuscular only while holding pressure on the  supratrochlear vessels in each area during each injection on either side of the medial corrugators. The filler was placed at the nasolabial folds.  No complications were noted. Light pressure was held for 5 minutes. She was instructed explicitly in post-operative care.  Botox LOT:  [id number]  J. Voluma Plus XC 0.55 ml x 2 syringes LOT: [id number]

## 2022-12-02 ENCOUNTER — Other Ambulatory Visit (HOSPITAL_COMMUNITY): Payer: Self-pay

## 2022-12-02 ENCOUNTER — Other Ambulatory Visit: Payer: Self-pay | Admitting: Adult Health

## 2022-12-02 ENCOUNTER — Other Ambulatory Visit: Payer: Self-pay

## 2022-12-02 DIAGNOSIS — G4720 Circadian rhythm sleep disorder, unspecified type: Secondary | ICD-10-CM

## 2022-12-02 MED ORDER — HYOSCYAMINE SULFATE 0.125 MG PO TABS
0.1250 mg | ORAL_TABLET | Freq: Two times a day (BID) | ORAL | 3 refills | Status: DC
Start: 2022-12-02 — End: 2023-05-27
  Filled 2022-12-02: qty 120, 40d supply, fill #0
  Filled 2023-01-09: qty 120, 40d supply, fill #1
  Filled 2023-03-03: qty 120, 40d supply, fill #2
  Filled 2023-04-07: qty 120, 40d supply, fill #3

## 2022-12-02 MED ORDER — XIFAXAN 550 MG PO TABS
550.0000 mg | ORAL_TABLET | Freq: Three times a day (TID) | ORAL | 3 refills | Status: AC
  Filled 2022-12-02 – 2022-12-06 (×2): qty 42, 14d supply, fill #0
  Filled 2022-12-18 – 2023-01-20 (×4): qty 42, 14d supply, fill #1
  Filled 2023-02-03: qty 42, 14d supply, fill #2
  Filled 2023-03-03 – 2023-06-11 (×5): qty 42, 14d supply, fill #3

## 2022-12-02 MED ORDER — ESZOPICLONE 1 MG PO TABS: 1.0000 mg | ORAL_TABLET | Freq: Every evening | ORAL | 0 refills | Status: DC | PRN

## 2022-12-02 NOTE — Telephone Encounter (Signed)
Per OV, ok to refill. Refilled 4 weeks ago for 30 day supply. Gardiner Rhyme, RN

## 2022-12-06 ENCOUNTER — Other Ambulatory Visit (HOSPITAL_COMMUNITY): Payer: Self-pay

## 2022-12-06 ENCOUNTER — Other Ambulatory Visit: Payer: Self-pay

## 2022-12-09 ENCOUNTER — Telehealth: Payer: 59 | Admitting: Family Medicine

## 2022-12-09 ENCOUNTER — Other Ambulatory Visit (HOSPITAL_COMMUNITY): Payer: Self-pay

## 2022-12-09 DIAGNOSIS — H6690 Otitis media, unspecified, unspecified ear: Secondary | ICD-10-CM | POA: Diagnosis not present

## 2022-12-09 MED ORDER — NEOMYCIN-POLYMYXIN-HC 3.5-10000-1 OT SOLN
3.0000 [drp] | Freq: Four times a day (QID) | OTIC | 0 refills | Status: AC
  Filled 2022-12-09: qty 10, 7d supply, fill #0

## 2022-12-09 NOTE — Progress Notes (Signed)
E-Visit for Ear Pain - Acute Otitis Media   We are sorry that you are not feeling well. Here is how we plan to help!  Based on what you have shared with me it looks like you have Acute Otitis Media.  Acute Otitis Media is an infection of the middle or "inner" ear. This type of infection can cause redness, inflammation, and fluid buildup behind the tympanic membrane (ear drum).  The usual symptoms include: Earache/Pain Fever Upper respiratory symptoms Lack of energy/Fatigue/Malaise Slight hearing loss gradually worsening- if the inner ear fills with fluid What causes middle ear infections? Most middle ear infections occur when an infection such as a cold, leads to a build-up of mucus in the middle ear and causes the Eustachian tube (a thin tube that runs from the middle ear to the back of the nose) to become swollen or blocked.   This means mucus can't drain away properly, making it easier for an infection to spread into the middle ear.  How middle ear infections are treated: Most ear infections clear up within three to five days and don't need any specific treatment. If necessary, tylenol or ibuprofen should be used to relieve pain and a high temperature.  If you develop a fever higher than 102, or any significantly worsening symptoms, this could indicate a more serious infection moving to the middle/inner and needs face to face evaluation in an office by a provider.   Antibiotics aren't routinely used to treat middle ear infections, although they may occasionally be prescribed if symptoms persist or are particularly severe. Given your presentation,     I have prescribed: Neomycin 0.35%, polymyxin B 10,000 units/mL, and hydrocortisone 0,5% otic solution 4 drops in affected ears four times a day for 7 days   Your symptoms should improve over the next 3 days and should resolve in about 7 days. Be sure to complete ALL of the prescription(s) given.  HOME CARE: Wash your hands  frequently. If you are prescribed an ear drop, do not place the tip of the bottle on your ear or touch it with your fingers. You can take Acetaminophen 650 mg every 4-6 hours as needed for pain.  If pain is severe or moderate, you can apply a heating pad (set on low) or hot water bottle (wrapped in a towel) to outer ear for 20 minutes.  This will also increase drainage.  GET HELP RIGHT AWAY IF: Fever is over 102.2 degrees. You develop progressive ear pain or hearing loss. Ear symptoms persist longer than 3 days after treatment.  MAKE SURE YOU: Understand these instructions. Will watch your condition. Will get help right away if you are not doing well or get worse.  Thank you for choosing an e-visit.  Your e-visit answers were reviewed by a board certified advanced clinical practitioner to complete your personal care plan. Depending upon the condition, your plan could have included both over the counter or prescription medications.  Please review your pharmacy choice. Make sure the pharmacy is open so you can pick up the prescription now. If there is a problem, you may contact your provider through [employer] and have the prescription routed to another pharmacy.  Your safety is important to [place]. If you have drug allergies check your prescription carefully.   For the next 24 hours you can use MyChart to ask questions about today's visit, request a non-urgent call back, or ask for a work or school excuse. You will get an email with a  survey after your eVisit asking about your experience. We would appreciate your feedback. I Jane Gutierrez that your e-visit has been valuable and will aid in your recovery.  I provided 5 minutes of non face-to-face time during this encounter for chart review, medication and order placement, as well as and documentation.

## 2022-12-11 ENCOUNTER — Ambulatory Visit: Payer: Self-pay

## 2022-12-11 ENCOUNTER — Ambulatory Visit (INDEPENDENT_AMBULATORY_CARE_PROVIDER_SITE_OTHER): Payer: 59 | Admitting: Orthopedic Surgery

## 2022-12-11 DIAGNOSIS — M1611 Unilateral primary osteoarthritis, right hip: Secondary | ICD-10-CM

## 2022-12-11 DIAGNOSIS — M25551 Pain in right hip: Secondary | ICD-10-CM

## 2022-12-12 ENCOUNTER — Telehealth: Payer: 59 | Admitting: Emergency Medicine

## 2022-12-12 ENCOUNTER — Encounter: Payer: Self-pay | Admitting: Orthopedic Surgery

## 2022-12-12 ENCOUNTER — Other Ambulatory Visit (HOSPITAL_COMMUNITY): Payer: Self-pay

## 2022-12-12 DIAGNOSIS — J3489 Other specified disorders of nose and nasal sinuses: Secondary | ICD-10-CM

## 2022-12-12 MED ORDER — LEVOFLOXACIN 500 MG PO TABS
500.0000 mg | ORAL_TABLET | Freq: Every day | ORAL | 0 refills | Status: DC
  Filled 2022-12-12: qty 7, 7d supply, fill #0

## 2022-12-12 NOTE — Progress Notes (Signed)
Because you have taken multiple antibiotics this month, I feel your condition warrants further evaluation and I recommend that you be seen in a face to face visit.   NOTE: There will be NO CHARGE for this eVisit   If you are having a true medical emergency please call 911.      For an urgent face to face visit, [facility] has seven urgent care centers for your convenience:     [facility] [facility] [place] Get Driving Directions [phone] [address] [place], [place] [address]    [facility]) Get Driving Directions [phone] [place], [place] [address]  [facility] [place] [facility]) Get Driving Directions [phone] [address] [place],  [place]  [address]  [facility] [address] Get Driving Directions  [phone] [address] [place],  [place] [address]   [facility] Get Driving Directions [phone] [address] [place], [place], [place] [address]   [facility] Get Driving Directions  [phone] [address] [place], [place] [address]   [facility] [place] Get Driving Directions [phone] [address] [place], [place] [address]  Your MyChart E-visit questionnaire answers were reviewed by a board certified advanced clinical practitioner to complete your personal care plan based on your specific symptoms.  Thank you for using e-Visits.

## 2022-12-12 NOTE — Progress Notes (Addendum)
   Procedure Note  Patient: Jane Gutierrez             Date of Birth: 01/20/1956           MRN: [id number]             Visit Date: 12/11/2022  Procedures: Visit Diagnoses:  1. Pain of right hip     Large Joint Inj: R hip joint on 12/11/2022 9:03 AM Indications: pain and diagnostic evaluation Details: 18 G 3.5 in needle, ultrasound-guided lateral approach  Arthrogram: No  Medications: 5 mL lidocaine 1 %; 80 mg methylPREDNISolone acetate 80 MG/ML; 4 mL bupivacaine 0.25 % Outcome: tolerated well, no immediate complications  Patient presents for planned injection ultrasound-guided into the right hip for arthritis. Procedure, treatment alternatives, risks and benefits explained, specific risks discussed. Consent was given by the patient. Immediately prior to procedure a time out was called to verify the correct patient, procedure, equipment, support staff and site/side marked as required. Patient was prepped and draped in the usual sterile fashion.     Bone density report also reviewed.  Patient has T-score on the AP spine region normal femoral neck T-score -1.9 consistent with osteopenia and femoral neck score on the contralateral side -2.1 indicating osteopenia.  Recommendation from referring physician was calcium and vitamin D daily along with weightbearing exercises and follow-up scan in 2 years.

## 2022-12-13 ENCOUNTER — Other Ambulatory Visit (HOSPITAL_COMMUNITY): Payer: Self-pay

## 2022-12-15 MED ORDER — METHYLPREDNISOLONE ACETATE 80 MG/ML IJ SUSP
80.0000 mg | INTRAMUSCULAR | Status: AC | PRN
  Administered 2022-12-11: 80 mg via INTRA_ARTICULAR

## 2022-12-15 MED ORDER — LIDOCAINE HCL 1 % IJ SOLN
5.0000 mL | INTRAMUSCULAR | Status: AC | PRN
  Administered 2022-12-11: 5 mL

## 2022-12-15 MED ORDER — BUPIVACAINE HCL 0.25 % IJ SOLN
4.0000 mL | INTRAMUSCULAR | Status: AC | PRN
  Administered 2022-12-11: 4 mL via INTRA_ARTICULAR

## 2022-12-17 ENCOUNTER — Other Ambulatory Visit (HOSPITAL_COMMUNITY): Payer: Self-pay

## 2022-12-18 ENCOUNTER — Other Ambulatory Visit (HOSPITAL_COMMUNITY): Payer: Self-pay

## 2022-12-18 ENCOUNTER — Other Ambulatory Visit: Payer: Self-pay

## 2022-12-19 DIAGNOSIS — H2513 Age-related nuclear cataract, bilateral: Secondary | ICD-10-CM | POA: Diagnosis not present

## 2022-12-19 DIAGNOSIS — H10412 Chronic giant papillary conjunctivitis, left eye: Secondary | ICD-10-CM | POA: Diagnosis not present

## 2022-12-20 ENCOUNTER — Other Ambulatory Visit (HOSPITAL_COMMUNITY): Payer: Self-pay

## 2022-12-20 ENCOUNTER — Other Ambulatory Visit: Payer: Self-pay | Admitting: Adult Health

## 2022-12-20 ENCOUNTER — Other Ambulatory Visit: Payer: Self-pay

## 2022-12-20 DIAGNOSIS — G4720 Circadian rhythm sleep disorder, unspecified type: Secondary | ICD-10-CM

## 2022-12-20 MED ORDER — ESZOPICLONE 1 MG PO TABS: 1.0000 mg | ORAL_TABLET | Freq: Every evening | ORAL | 0 refills | Status: DC | PRN

## 2022-12-20 MED ORDER — ESZOPICLONE 1 MG PO TABS
1.0000 mg | ORAL_TABLET | Freq: Every evening | ORAL | 0 refills | Status: DC | PRN
  Filled 2022-12-20: qty 30, 30d supply, fill #0

## 2022-12-20 NOTE — Progress Notes (Signed)
Lunesta sent electronically.    Wilber Bihari, NP 12/20/22 4:45 PM Medical Oncology and Hematology [facility] [place], [place] [address] Tel. [phone]    Fax. [phone]

## 2022-12-24 ENCOUNTER — Other Ambulatory Visit (HOSPITAL_COMMUNITY): Payer: Self-pay

## 2022-12-24 MED ORDER — SYNTHROID 100 MCG PO TABS
100.0000 ug | ORAL_TABLET | Freq: Every morning | ORAL | 3 refills | Status: DC
  Filled 2022-12-24: qty 90, 90d supply, fill #0
  Filled 2023-03-24: qty 90, 90d supply, fill #1
  Filled 2023-06-16: qty 90, 90d supply, fill #2
  Filled 2023-09-18: qty 90, 90d supply, fill #3

## 2022-12-28 ENCOUNTER — Ambulatory Visit (INDEPENDENT_AMBULATORY_CARE_PROVIDER_SITE_OTHER): Payer: Self-pay | Admitting: Plastic Surgery

## 2022-12-28 ENCOUNTER — Encounter: Payer: Self-pay | Admitting: Plastic Surgery

## 2022-12-28 VITALS — BP 152/89 | HR 68

## 2022-12-28 DIAGNOSIS — Z719 Counseling, unspecified: Secondary | ICD-10-CM

## 2022-12-28 NOTE — Progress Notes (Signed)
Preoperative Dx: hyperpigmentation of forehead  Postoperative Dx:  same  Procedure: laser to forehead   Anesthesia: none  Description of Procedure:  Risks and complications were explained to the patient. Consent was confirmed and signed. Eye protection was placed. Time out was called and all information was confirmed to be correct. The area  area was prepped with alcohol and wiped dry. The 515 nm laser was set at 15 J/cm2. The forehead was lasered. The patient tolerated the procedure well and there were no complications. The patient is to follow up in 4 weeks.

## 2022-12-30 DIAGNOSIS — Z719 Counseling, unspecified: Secondary | ICD-10-CM

## 2023-01-01 ENCOUNTER — Other Ambulatory Visit (HOSPITAL_COMMUNITY): Payer: Self-pay

## 2023-01-02 DIAGNOSIS — H25813 Combined forms of age-related cataract, bilateral: Secondary | ICD-10-CM | POA: Diagnosis not present

## 2023-01-02 DIAGNOSIS — H04122 Dry eye syndrome of left lacrimal gland: Secondary | ICD-10-CM | POA: Diagnosis not present

## 2023-01-02 DIAGNOSIS — H10412 Chronic giant papillary conjunctivitis, left eye: Secondary | ICD-10-CM | POA: Diagnosis not present

## 2023-01-09 ENCOUNTER — Other Ambulatory Visit (HOSPITAL_COMMUNITY): Payer: Self-pay

## 2023-01-10 ENCOUNTER — Other Ambulatory Visit: Payer: Self-pay

## 2023-01-10 ENCOUNTER — Other Ambulatory Visit (HOSPITAL_COMMUNITY): Payer: Self-pay

## 2023-01-14 DIAGNOSIS — Z719 Counseling, unspecified: Secondary | ICD-10-CM

## 2023-01-15 ENCOUNTER — Encounter: Payer: Self-pay | Admitting: Adult Health

## 2023-01-15 ENCOUNTER — Inpatient Hospital Stay: Payer: Commercial Managed Care - PPO | Attending: Adult Health | Admitting: Adult Health

## 2023-01-15 ENCOUNTER — Other Ambulatory Visit (HOSPITAL_COMMUNITY): Payer: Self-pay

## 2023-01-15 ENCOUNTER — Encounter: Payer: 59 | Admitting: Adult Health

## 2023-01-15 VITALS — BP 159/88 | HR 87 | Temp 97.8°F | Resp 16 | Ht 64.0 in | Wt 141.1 lb

## 2023-01-15 DIAGNOSIS — Z853 Personal history of malignant neoplasm of breast: Secondary | ICD-10-CM | POA: Insufficient documentation

## 2023-01-15 DIAGNOSIS — G4720 Circadian rhythm sleep disorder, unspecified type: Secondary | ICD-10-CM

## 2023-01-15 DIAGNOSIS — K5900 Constipation, unspecified: Secondary | ICD-10-CM | POA: Insufficient documentation

## 2023-01-15 DIAGNOSIS — C50412 Malignant neoplasm of upper-outer quadrant of left female breast: Secondary | ICD-10-CM

## 2023-01-15 DIAGNOSIS — K219 Gastro-esophageal reflux disease without esophagitis: Secondary | ICD-10-CM | POA: Insufficient documentation

## 2023-01-15 DIAGNOSIS — Z170 Estrogen receptor positive status [ER+]: Secondary | ICD-10-CM | POA: Diagnosis not present

## 2023-01-15 DIAGNOSIS — Z9221 Personal history of antineoplastic chemotherapy: Secondary | ICD-10-CM | POA: Insufficient documentation

## 2023-01-15 DIAGNOSIS — Z9013 Acquired absence of bilateral breasts and nipples: Secondary | ICD-10-CM | POA: Insufficient documentation

## 2023-01-15 MED ORDER — ESZOPICLONE 1 MG PO TABS
1.0000 mg | ORAL_TABLET | Freq: Every evening | ORAL | 5 refills | Status: DC | PRN
  Filled 2023-01-15 – 2023-02-03 (×3): qty 30, 30d supply, fill #0
  Filled 2023-03-03: qty 30, 30d supply, fill #1
  Filled 2023-03-27 – 2023-04-07 (×2): qty 30, 30d supply, fill #2
  Filled 2023-05-02: qty 30, 30d supply, fill #3
  Filled 2023-06-01: qty 30, 30d supply, fill #4
  Filled 2023-07-03: qty 30, 30d supply, fill #5

## 2023-01-15 NOTE — Assessment & Plan Note (Signed)
Jane Gutierrez is a 67 year old woman with history of stage IIa left-sided triple positive breast cancer, and stage Ia right-sided ER/PR positive breast cancer.  She was diagnosed in January 2014 and she is status post bilateral mastectomies followed by adjuvant chemotherapy maintenance Herceptin therapy and bilateral breast reconstruction.  She was unable to tolerate antiestrogen therapy.  Jane Gutierrez has no clinical or radiographic signs of breast cancer recurrence.  She will continue on observation alone with an annual breast exam.  She would prefer to return to our office for her continued long-term surveillance.  I did refill her Lunesta for her sleep pattern disturbance for which she has decreased from 3 mg to 1 mg nightly.  She tolerates this well and notes that she is able to get good quality sleep at this dose.  I recommended that she continue to follow-up with her primary care provider along with her cardiologist and continue with her healthy diet and exercise.

## 2023-01-15 NOTE — Progress Notes (Signed)
[facility] Follow up:    Jane Bowen, MD [place] [address]   DIAGNOSIS:  Cancer Staging  Breast cancer of upper-outer quadrant of left female breast [facility]) Staging form: Breast, AJCC 7th Edition - Clinical: Stage 0 (Tis (DCIS), N0, M0) - Unsigned Laterality: Left - Pathologic: Stage IIA (T2, N0, cM0) - Unsigned Laterality: Left - Clinical: Stage IA (T1b, N0, M0) - Unsigned Laterality: Right - Pathologic: T0 - Unsigned Laterality: Right   SUMMARY OF ONCOLOGIC HISTORY: Oncology History  Breast cancer, left breast ([place]) (Resolved)   Initial Diagnosis   Breast cancer, left breast   12/04/2012 Initial Biopsy   Left breast needle core biopsy (UOQ): Grade 3, DCIS with necrosis and calcs. ER+ (100%), PR+ (86%).    Breast cancer of upper-outer quadrant of left female breast ([place])  12/04/2012 Initial Biopsy   LEFT breast needle core biopsy (UOQ): Grade 3, DCIS with necrosis and calcs. ER+ (100%), PR+ (86%).    12/14/2012 Breast MRI   LEFT breast: Clumped nodular enhancement measuring 7.2 x 3.2 x 2.6 cm. Also associated post-biopsy change. RIGHT breast: At 12 o'clock location, there is 50m slightly irregular mass-like area of abnml enhancement. No lymphadenopathy.    12/25/2012 Initial Biopsy   RIGHT breast needle core biopsy (upper centeral): Grade 1-2, IDC, DCIS with calcs. ER+ (100%), PR+ (100%), HER2- (ratio 1.23). Ki67 4%.    12/25/2012 Initial Diagnosis   Bilateral breast cancer   12/31/2012 Surgery   Bilateral mastectomies with bilat SLNB ([facility]. LEFT: Grade 2, IDC, 2.3 cm. (+) LVI. Grade 3 DCIS w/comedonecrosis & calcs. (-) margins. 1 left ax SLN neg.  ER+ (99%), PR+ (10%), HER2 repeated and (+) ratio (6.81). Ki67 46%. RIGHT: Benign, 1 SLN neg.   12/31/2012 Pathologic Stage   LEFT breast: pT2, pN0, pMx. Stage IIA.    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 EF 55-60%   02/04/2013 - 05/13/2013 Adjuvant Chemotherapy   Taxotere/Carbo/Herceptin q 3 wks x 5 cycles completed. (planned for 6 cycles but d/c'd early due to pt wishes and recent infection).    05/2013 -  Anti-estrogen oral therapy   Attempted to take Letrozole, Aromasin, & Tamoxifen but patient could not tolerate any anti-estrogen therapy due to severe depressive symptoms and joint/muscle aches & pains.  No longer taking anti-estrogen therapy.   09/29/2013 Surgery   LEFT breast reconstruction with lat flap and placement of tissue expander ([place]).    11/26/2013 Imaging   CT chest: No definite CT findings for chest wall recurrence and no suspicious supraclavicular or axillary adenopathy.  Negative for metastatic disease in lungs or bones.     - 02/16/2014 Adjuvant Chemotherapy   Herceptin maintenance x 1 year completed.    03/09/2014 Surgery   Removal of bilateral tissue expanders and placement of bilateral breast implants ([place]).    03/09/2014 Procedure   Right scar biopsy: No malignancy & Left breast capsule biopsy: No evidence of malignancy.    08/22/2014 Echocardiogram   Post-treatment. EF 55-60%   06/30/2015 Survivorship   Survivorship Care Plan given to patient and reviewed with her during in-person visit.      CURRENT THERAPY: observation  INTERVAL HISTORY: Jane Milling89y.o. female returns for f/u of her history of HER2 positive breast cancer.  She is doing well.  She has been exercising, and continues to f/u with her PCP and cardiology.  She denies any  concerns today.    Patient Active Problem List   Diagnosis Date Noted   Gastroesophageal reflux disease 01/15/2023   Constipation 01/15/2023   Breast pain 05/28/2022   Encounter for counseling 10/16/2021   Hardening of the aorta (main artery of the heart) ([place]) 03/02/2021   Genital herpes simplex 02/22/2021   Family history of diseases of the blood and blood-forming organs and certain  disorders involving the immune mechanism 10/21/2019   Polyneuropathy 10/21/2019   Rash and other nonspecific skin eruption 10/21/2019   Raynaud's disease 01/12/2019   Osteoarthritis 11/12/2018   Cough 10/27/2017   Pain in right hand 09/02/2017   Chronic sinusitis 08/18/2017   Wheezing 08/18/2017   Psychophysiologic insomnia 01/06/2017   Pain in left elbow 01/02/2016   Dyspnea 09/11/2015   Abnormal weight loss 08/17/2015   Alopecia 08/17/2015   Fatigue 08/17/2015   Joint pain 08/17/2015   Breast cancer of upper-outer quadrant of left female breast ([place]) 06/28/2015   Nontoxic single thyroid nodule 12/28/2014   Right hip pain 09/19/2014   Right knee pain 09/19/2014   Dyspnea on exertion 08/08/2014   Heart palpitations 08/08/2014   Acquired absence of breast and absent nipple, bilateral 03/09/2014   Myopathy 02/01/2014   Localized swelling, mass and lump, trunk 11/22/2013   Pre-operative cardiovascular examination 09/09/2013   Other chronic pulmonary heart diseases 05/27/2013   SIRS (systemic inflammatory response syndrome) ([place]) 04/14/2013   Anemia 04/14/2013   Dehydration 04/14/2013   Fever 04/14/2013   IBS (irritable bowel syndrome)    Lyme disease    Asthma    Hypothyroidism    Contact lens/glasses fitting    Hyperlipidemia 11/16/2012   Headache 02/05/2012   Benign neoplasm of colon 09/17/2011   Migraine without aura, not refractory 09/17/2011   Malignant neoplasm of cervix uteri ([facility]) 09/06/2009   Personal history of other specified conditions 09/06/2009   Thrombophlebitis of superficial veins of lower extremity 09/06/2009   Varicose veins of other specified sites 09/06/2009    is allergic to betadine [povidone iodine], doxycycline, iodine, shellfish-derived products, povidone-iodine, rosuvastatin calcium, amoxicillin, ginger, penicillins, and sulfamethoxazole-trimethoprim.  MEDICAL HISTORY: Past Medical History:  Diagnosis Date   Abnormally small mouth    ADD  (attention deficit disorder)    ADHD    Allergy    Anxiety    pt reported she doesn't have anxiety   Asthma    prn inhaler   Blood transfusion without reported diagnosis    Breast cancer ([place])    Dental crowns present    Difficulty swallowing pills    History of breast cancer    History of cervical cancer    History of palpitations    last echo 08/22/2014   Hypothyroidism    IBS (irritable bowel syndrome)     SURGICAL HISTORY: Past Surgical History:  Procedure Laterality Date   ABDOMINAL HYSTERECTOMY     ABDOMINOPLASTY  03/28/2004   AXILLARY SENTINEL NODE BIOPSY  12/31/2012   Procedure: AXILLARY SENTINEL NODE BIOPSY;  Surgeon: Rolm Bookbinder, MD;  Location: [place];  Service: General;  Laterality: Bilateral;  bilateral sentinel node   BRAIN SURGERY     removal benign frontal lobe cyst in [place] Bilateral ~ 2008   BREAST IMPLANT EXCHANGE Bilateral 12/11/2017   Procedure: BILATERAL IMPLANT EXCHANGE;  Surgeon: Wallace Going, DO;  Location: [place];  Service: Plastics;  Laterality: Bilateral;   BREAST RECONSTRUCTION WITH PLACEMENT OF TISSUE EXPANDER AND FLEX HD (ACELLULAR  HYDRATED DERMIS)  12/31/2012   Procedure: BREAST RECONSTRUCTION WITH PLACEMENT OF TISSUE EXPANDER AND FLEX HD (ACELLULAR HYDRATED DERMIS);  Surgeon: Theodoro Kos, DO;  Location: [place];  Service: Plastics;  Laterality: Bilateral;  bilateral immediate breast reconstruction with expanders and flex hd    CAPSULOTOMY Left 09/01/2014   Procedure: CAPSULOTOMY OF LEFT BREAST WITH LIPOFILLING FOR SYMMETRY;  Surgeon: Theodoro Kos, DO;  Location: [place];  Service: Plastics;  Laterality: Left;   CERVICAL CERCLAGE     DIAGNOSTIC LAPAROSCOPY  05/12/2003   right partial salpingectomy; lysis paraovarian adhesions left   DILATION AND CURETTAGE OF UTERUS  2005   ENDOMETRIAL FULGURATION  05/12/2003   FACIAL COSMETIC SURGERY  ~  2010   FOOT SURGERY Bilateral 07/2012   GYNECOLOGIC CRYOSURGERY  1998   INCISION AND DRAINAGE OF WOUND Left 04/28/2013   Procedure: IRRIGATION AND DEBRIDEMENT LEFT BREAST WOUND, POSSIBLE CLOSURE OF WOUND WITH DRAIN PLACEMENT;  Surgeon: Theodoro Kos, DO;  Location: [place];  Service: Plastics;  Laterality: Left;   LATISSIMUS FLAP TO BREAST Left 09/29/2013   Procedure: LATISSIMUS FLAP TO BREAST WITH TISSUE EXPANDER PLACEMENT FOR LEFT BREAST RECONSTRUCTION;  Surgeon: Theodoro Kos, DO;  Location: [place];  Service: Plastics;  Laterality: Left;   LIPECTOMY Bilateral 03/28/2004   hip   LIPOSUCTION Bilateral 03/09/2014   Procedure: LIPOSUCTION ABDOMEN W/ LIPOFILLING LATERAL BILATERAL BREAST;  Surgeon: Theodoro Kos, DO;  Location: [place];  Service: Plastics;  Laterality: Bilateral;  LIPOSUCTION NECK   LIPOSUCTION WITH LIPOFILLING Bilateral 09/01/2014   Procedure: LIPOSUCTION WITH LIPOFILLING;  Surgeon: Theodoro Kos, DO;  Location: [place];  Service: Plastics;  Laterality: Bilateral;   LYSIS OF ADHESION  03/28/2004   pelvic   MASTECTOMY     PORT-A-CATH REMOVAL Right 03/09/2014   Procedure: REMOVAL PORT-A-CATH;  Surgeon: Theodoro Kos, DO;  Location: [place];  Service: Plastics;  Laterality: Right;   PORTACATH PLACEMENT  01/27/2013   Procedure: INSERTION PORT-A-CATH;  Surgeon: Rolm Bookbinder, MD;  Location: [place];  Service: General;  Laterality: Right;   RADIOFREQUENCY ABLATION NERVES  05/12/2003   uterosacral nerve ablation   REMOVAL OF BILATERAL TISSUE EXPANDERS WITH PLACEMENT OF BILATERAL BREAST IMPLANTS Bilateral 03/09/2014   Procedure: REMOVAL OF BILATERAL TISSUE EXPANDERS WITH PLACEMENT OF BILATERAL BREAST IMPLANTS;  Surgeon: Theodoro Kos, DO;  Location: [place];  Service: Plastics;  Laterality: Bilateral;   TEE WITHOUT CARDIOVERSION N/A 06/04/2013   Procedure: TRANSESOPHAGEAL ECHOCARDIOGRAM  (TEE);  Surgeon: Jolaine Artist, MD;  Location: [facility];  Service: Cardiovascular;  Laterality: N/A;   TISSUE EXPANDER PLACEMENT Left 06/16/2013   Procedure: LEFT BREAST TISSUE EXPANDER REMOVAL;  Surgeon: Theodoro Kos, DO;  Location: [place];  Service: Plastics;  Laterality: Left;   TISSUE EXPANDER PLACEMENT Left 09/29/2013   Procedure: TISSUE EXPANDER;  Surgeon: Theodoro Kos, DO;  Location: [place];  Service: Plastics;  Laterality: Left;   TOTAL ABDOMINAL HYSTERECTOMY W/ BILATERAL SALPINGOOPHORECTOMY  03/28/2004   TOTAL MASTECTOMY  12/31/2012   Procedure: TOTAL MASTECTOMY;  Surgeon: Rolm Bookbinder, MD;  Location: [place];  Service: General;  Laterality: Bilateral;  bilateral total mastectomies    SOCIAL HISTORY: Social History   Socioeconomic History   Marital status: Married    Spouse name: Not on file   Number of children: Not on file   Years of education: Not on file   Highest education level: Not on file  Occupational History   Not on file  Tobacco Use   Smoking status: Never   Smokeless tobacco: Never  Substance and Sexual Activity   Alcohol use: No    Alcohol/week: 0.0 standard drinks of alcohol    Comment: occasionally   Drug use: No   Sexual activity: Yes  Other Topics Concern   Not on file  Social History Narrative   Not on file   Social Determinants of Health   Financial Resource Strain: Not on file  Food Insecurity: Not on file  Transportation Needs: Not on file  Physical Activity: Not on file  Stress: Not on file  Social Connections: Not on file  Intimate Partner Violence: Not on file    FAMILY HISTORY: Family History  Problem Relation Age of Onset   Cancer Mother 21       uterine, mult myeloma   CVA Father    Cancer Sister 58       breast   Leukemia Sister        CLL   Lupus Sister        twin sister   Sarcoidosis Sister        twin sister   Rheum arthritis Sister        twin sister   Cancer  Maternal Grandmother 62       breast   Celiac disease Daughter    Migraines Daughter     Review of Systems  Constitutional:  Negative for appetite change, chills, fatigue, fever and unexpected weight change.  HENT:   Negative for hearing loss, lump/mass and trouble swallowing.   Eyes:  Negative for eye problems and icterus.  Respiratory:  Negative for chest tightness, cough and shortness of breath.   Cardiovascular:  Negative for chest pain, leg swelling and palpitations.  Gastrointestinal:  Negative for abdominal distention, abdominal pain, constipation, diarrhea, nausea and vomiting.  Endocrine: Negative for hot flashes.  Genitourinary:  Negative for difficulty urinating.   Musculoskeletal:  Negative for arthralgias.  Skin:  Negative for itching and rash.  Neurological:  Negative for dizziness, extremity weakness, headaches and numbness.  Hematological:  Negative for adenopathy. Does not bruise/bleed easily.  Psychiatric/Behavioral:  Negative for depression. The patient is not nervous/anxious.       PHYSICAL EXAMINATION  ECOG PERFORMANCE STATUS: 0 - Asymptomatic  Vitals:   01/15/23 1056  BP: (!) 159/88  Pulse: 87  Resp: 16  Temp: 97.8 F (36.6 C)  SpO2: 100%    Physical Exam Constitutional:      General: She is not in acute distress.    Appearance: Normal appearance. She is not toxic-appearing.  HENT:     Head: Normocephalic and atraumatic.  Eyes:     General: No scleral icterus. Cardiovascular:     Rate and Rhythm: Normal rate and regular rhythm.     Pulses: Normal pulses.     Heart sounds: Normal heart sounds.  Pulmonary:     Effort: Pulmonary effort is normal.     Breath sounds: Normal breath sounds.  Chest:     Comments: S/p bilateral mastectomies with reconstruction, no sign of local recurrence Abdominal:     General: Abdomen is flat. Bowel sounds are normal. There is no distension.     Palpations: Abdomen is soft.     Tenderness: There is no abdominal  tenderness.  Musculoskeletal:        General: No swelling.     Cervical back: Neck supple.  Lymphadenopathy:     Cervical:  No cervical adenopathy.  Skin:    General: Skin is warm and dry.     Findings: No rash.  Neurological:     General: No focal deficit present.     Mental Status: She is alert.  Psychiatric:        Mood and Affect: Mood normal.        Behavior: Behavior normal.     LABORATORY DATA: None for this visit    ASSESSMENT and THERAPY PLAN:   Breast cancer of upper-outer quadrant of left female breast ([place]) Jane Gutierrez is a 67 year old woman with history of stage IIa left-sided triple positive breast cancer, and stage Ia right-sided ER/PR positive breast cancer.  She was diagnosed in January 2014 and she is status post bilateral mastectomies followed by adjuvant chemotherapy maintenance Herceptin therapy and bilateral breast reconstruction.  She was unable to tolerate antiestrogen therapy.  Jane Gutierrez has no clinical or radiographic signs of breast cancer recurrence.  She will continue on observation alone with an annual breast exam.  She would prefer to return to our office for her continued long-term surveillance.  I did refill her Lunesta for her sleep pattern disturbance for which she has decreased from 3 mg to 1 mg nightly.  She tolerates this well and notes that she is able to get good quality sleep at this dose.  I recommended that she continue to follow-up with her primary care provider along with her cardiologist and continue with her healthy diet and exercise.   All questions were answered. The patient knows to call the clinic with any problems, questions or concerns. We can certainly see the patient much sooner if necessary.  Total encounter time:20 minutes*in face-to-face visit time, chart review, lab review, care coordination, order entry, and documentation of the encounter time.    Wilber Bihari, NP 01/15/23 11:36 AM Medical Oncology and Hematology [facility] [place], [place] [address] Tel. [phone]    Fax. [phone]  *Total Encounter Time as defined by the Centers for Medicare and Medicaid Services includes, in addition to the face-to-face time of a patient visit (documented in the note above) non-face-to-face time: obtaining and reviewing outside history, ordering and reviewing medications, tests or procedures, care coordination (communications with other health care professionals or caregivers) and documentation in the medical record.

## 2023-01-16 ENCOUNTER — Other Ambulatory Visit (HOSPITAL_COMMUNITY): Payer: Self-pay

## 2023-01-17 ENCOUNTER — Ambulatory Visit: Payer: Commercial Managed Care - PPO

## 2023-01-17 DIAGNOSIS — I868 Varicose veins of other specified sites: Secondary | ICD-10-CM

## 2023-01-17 NOTE — Progress Notes (Signed)
Treated pt's BLE spider and small reticular veins with Asclera 1% administered with a 27g butterfly.  Patient received a total of 4 mL. Pt tolerated well. Pt was given post treatment care instructions on both handout and verbal. She will call us if any questions/concerns arise.      Photos: Yes.    Compression stockings applied: Yes.

## 2023-01-20 ENCOUNTER — Other Ambulatory Visit (HOSPITAL_COMMUNITY): Payer: Self-pay

## 2023-01-21 ENCOUNTER — Other Ambulatory Visit (HOSPITAL_COMMUNITY): Payer: Self-pay

## 2023-01-21 DIAGNOSIS — Z1212 Encounter for screening for malignant neoplasm of rectum: Secondary | ICD-10-CM | POA: Diagnosis not present

## 2023-01-21 DIAGNOSIS — E785 Hyperlipidemia, unspecified: Secondary | ICD-10-CM | POA: Diagnosis not present

## 2023-01-21 DIAGNOSIS — E039 Hypothyroidism, unspecified: Secondary | ICD-10-CM | POA: Diagnosis not present

## 2023-01-21 DIAGNOSIS — I700 Atherosclerosis of aorta: Secondary | ICD-10-CM | POA: Diagnosis not present

## 2023-01-22 ENCOUNTER — Other Ambulatory Visit (HOSPITAL_COMMUNITY): Payer: Self-pay

## 2023-01-23 DIAGNOSIS — R82998 Other abnormal findings in urine: Secondary | ICD-10-CM | POA: Diagnosis not present

## 2023-01-29 ENCOUNTER — Other Ambulatory Visit: Payer: Self-pay

## 2023-01-29 ENCOUNTER — Other Ambulatory Visit (HOSPITAL_COMMUNITY): Payer: Self-pay

## 2023-01-29 MED ORDER — ROSUVASTATIN CALCIUM 5 MG PO TABS
5.0000 mg | ORAL_TABLET | ORAL | 5 refills | Status: DC
  Filled 2023-01-29: qty 24, 84d supply, fill #0
  Filled 2023-04-25: qty 24, 84d supply, fill #1
  Filled 2023-06-16 – 2023-07-24 (×2): qty 24, 84d supply, fill #2
  Filled 2023-09-18: qty 24, 84d supply, fill #3
  Filled 2023-10-13 – 2023-12-08 (×4): qty 24, 84d supply, fill #4
  Filled 2023-12-31: qty 24, 84d supply, fill #5

## 2023-01-29 MED ORDER — ALBUTEROL SULFATE HFA 108 (90 BASE) MCG/ACT IN AERS
2.0000 | INHALATION_SPRAY | Freq: Four times a day (QID) | RESPIRATORY_TRACT | 11 refills | Status: DC
  Filled 2023-01-29: qty 6.7, 25d supply, fill #0
  Filled 2023-03-03: qty 6.7, 25d supply, fill #1
  Filled 2023-04-04: qty 6.7, 25d supply, fill #2
  Filled 2023-04-25: qty 6.7, 25d supply, fill #3
  Filled 2023-05-26: qty 6.7, 25d supply, fill #4
  Filled 2023-07-03: qty 6.7, 25d supply, fill #5
  Filled 2023-07-24: qty 6.7, 25d supply, fill #6
  Filled 2023-08-21: qty 6.7, 25d supply, fill #7
  Filled 2023-09-18: qty 6.7, 25d supply, fill #8
  Filled 2023-10-20: qty 6.7, 25d supply, fill #9
  Filled 2023-12-08: qty 6.7, 25d supply, fill #10
  Filled 2023-12-31: qty 6.7, 25d supply, fill #11

## 2023-01-29 MED ORDER — DEXTROAMPHETAMINE SULFATE 10 MG PO TABS
20.0000 mg | ORAL_TABLET | Freq: Two times a day (BID) | ORAL | 0 refills | Status: DC
  Filled 2023-01-29: qty 360, 90d supply, fill #0

## 2023-01-30 ENCOUNTER — Other Ambulatory Visit (HOSPITAL_COMMUNITY): Payer: Self-pay

## 2023-01-30 DIAGNOSIS — L309 Dermatitis, unspecified: Secondary | ICD-10-CM | POA: Diagnosis not present

## 2023-01-30 MED ORDER — TRIAMCINOLONE ACETONIDE 0.1 % EX CREA
1.0000 | TOPICAL_CREAM | Freq: Two times a day (BID) | CUTANEOUS | 1 refills | Status: DC
  Filled 2023-01-30: qty 80, 15d supply, fill #0
  Filled 2023-02-03 – 2023-03-03 (×2): qty 80, 15d supply, fill #1

## 2023-02-03 ENCOUNTER — Other Ambulatory Visit: Payer: Self-pay

## 2023-02-03 ENCOUNTER — Other Ambulatory Visit (HOSPITAL_COMMUNITY): Payer: Self-pay

## 2023-02-03 MED ORDER — VALACYCLOVIR HCL 500 MG PO TABS
500.0000 mg | ORAL_TABLET | Freq: Two times a day (BID) | ORAL | 1 refills | Status: DC
  Filled 2023-02-03: qty 90, 45d supply, fill #0
  Filled 2023-03-03 – 2023-03-14 (×2): qty 90, 45d supply, fill #1

## 2023-02-21 ENCOUNTER — Ambulatory Visit (INDEPENDENT_AMBULATORY_CARE_PROVIDER_SITE_OTHER): Payer: Self-pay | Admitting: Plastic Surgery

## 2023-02-21 ENCOUNTER — Encounter: Payer: Self-pay | Admitting: Plastic Surgery

## 2023-02-21 VITALS — BP 165/82 | HR 79

## 2023-02-21 DIAGNOSIS — Z719 Counseling, unspecified: Secondary | ICD-10-CM

## 2023-02-21 NOTE — Progress Notes (Signed)

## 2023-03-03 ENCOUNTER — Other Ambulatory Visit (HOSPITAL_COMMUNITY): Payer: Self-pay

## 2023-03-03 ENCOUNTER — Other Ambulatory Visit: Payer: Self-pay

## 2023-03-14 ENCOUNTER — Encounter: Payer: Self-pay | Admitting: Adult Health

## 2023-03-14 NOTE — Progress Notes (Signed)
Faxed prescription to [facility] for patient to get a wig at [phone]. Fax confirmation received.

## 2023-03-21 ENCOUNTER — Other Ambulatory Visit (HOSPITAL_COMMUNITY): Payer: Self-pay

## 2023-03-24 ENCOUNTER — Other Ambulatory Visit (HOSPITAL_COMMUNITY): Payer: Self-pay

## 2023-03-27 ENCOUNTER — Other Ambulatory Visit: Payer: Self-pay

## 2023-03-27 ENCOUNTER — Other Ambulatory Visit (HOSPITAL_COMMUNITY): Payer: Self-pay

## 2023-03-28 ENCOUNTER — Other Ambulatory Visit: Payer: Self-pay

## 2023-04-07 ENCOUNTER — Other Ambulatory Visit (HOSPITAL_COMMUNITY): Payer: Self-pay

## 2023-04-07 ENCOUNTER — Other Ambulatory Visit: Payer: Self-pay

## 2023-04-07 DIAGNOSIS — L958 Other vasculitis limited to the skin: Secondary | ICD-10-CM | POA: Diagnosis not present

## 2023-04-07 DIAGNOSIS — I8390 Asymptomatic varicose veins of unspecified lower extremity: Secondary | ICD-10-CM | POA: Diagnosis not present

## 2023-04-07 DIAGNOSIS — L309 Dermatitis, unspecified: Secondary | ICD-10-CM | POA: Diagnosis not present

## 2023-04-07 DIAGNOSIS — H10413 Chronic giant papillary conjunctivitis, bilateral: Secondary | ICD-10-CM | POA: Diagnosis not present

## 2023-04-07 DIAGNOSIS — H16223 Keratoconjunctivitis sicca, not specified as Sjogren's, bilateral: Secondary | ICD-10-CM | POA: Diagnosis not present

## 2023-04-07 MED ORDER — PREDNISOLONE ACETATE 1 % OP SUSP
1.0000 [drp] | Freq: Two times a day (BID) | OPHTHALMIC | 0 refills | Status: DC
  Filled 2023-04-07: qty 5, 25d supply, fill #0

## 2023-04-08 ENCOUNTER — Other Ambulatory Visit (HOSPITAL_COMMUNITY): Payer: Self-pay

## 2023-04-08 DIAGNOSIS — R110 Nausea: Secondary | ICD-10-CM | POA: Diagnosis not present

## 2023-04-08 DIAGNOSIS — K219 Gastro-esophageal reflux disease without esophagitis: Secondary | ICD-10-CM | POA: Diagnosis not present

## 2023-04-08 DIAGNOSIS — R1013 Epigastric pain: Secondary | ICD-10-CM | POA: Diagnosis not present

## 2023-04-08 DIAGNOSIS — Z86010 Personal history of colonic polyps: Secondary | ICD-10-CM | POA: Diagnosis not present

## 2023-04-08 DIAGNOSIS — R1310 Dysphagia, unspecified: Secondary | ICD-10-CM | POA: Diagnosis not present

## 2023-04-08 MED ORDER — XIIDRA 5 % OP SOLN
1.0000 [drp] | Freq: Two times a day (BID) | OPHTHALMIC | 4 refills | Status: DC
  Filled 2023-04-08: qty 180, 90d supply, fill #0
  Filled 2023-11-01: qty 180, 90d supply, fill #1

## 2023-04-09 ENCOUNTER — Other Ambulatory Visit (HOSPITAL_COMMUNITY): Payer: Self-pay

## 2023-04-09 DIAGNOSIS — I700 Atherosclerosis of aorta: Secondary | ICD-10-CM | POA: Diagnosis not present

## 2023-04-09 DIAGNOSIS — M791 Myalgia, unspecified site: Secondary | ICD-10-CM | POA: Diagnosis not present

## 2023-04-09 DIAGNOSIS — E785 Hyperlipidemia, unspecified: Secondary | ICD-10-CM | POA: Diagnosis not present

## 2023-04-09 MED ORDER — FAMOTIDINE 40 MG PO TABS
40.0000 mg | ORAL_TABLET | Freq: Two times a day (BID) | ORAL | 4 refills | Status: DC
  Filled 2023-04-09: qty 180, 90d supply, fill #0
  Filled 2023-04-25 – 2023-07-03 (×5): qty 180, 90d supply, fill #1

## 2023-04-16 ENCOUNTER — Other Ambulatory Visit (HOSPITAL_COMMUNITY): Payer: Self-pay

## 2023-04-17 DIAGNOSIS — F900 Attention-deficit hyperactivity disorder, predominantly inattentive type: Secondary | ICD-10-CM | POA: Diagnosis not present

## 2023-04-21 DIAGNOSIS — K219 Gastro-esophageal reflux disease without esophagitis: Secondary | ICD-10-CM | POA: Diagnosis not present

## 2023-04-21 DIAGNOSIS — K2950 Unspecified chronic gastritis without bleeding: Secondary | ICD-10-CM | POA: Diagnosis not present

## 2023-04-21 DIAGNOSIS — R1310 Dysphagia, unspecified: Secondary | ICD-10-CM | POA: Diagnosis not present

## 2023-04-21 DIAGNOSIS — R1033 Periumbilical pain: Secondary | ICD-10-CM | POA: Diagnosis not present

## 2023-04-21 DIAGNOSIS — K2970 Gastritis, unspecified, without bleeding: Secondary | ICD-10-CM | POA: Diagnosis not present

## 2023-04-22 DIAGNOSIS — M310 Hypersensitivity angiitis: Secondary | ICD-10-CM | POA: Diagnosis not present

## 2023-04-22 DIAGNOSIS — E785 Hyperlipidemia, unspecified: Secondary | ICD-10-CM | POA: Diagnosis not present

## 2023-04-22 DIAGNOSIS — G729 Myopathy, unspecified: Secondary | ICD-10-CM | POA: Diagnosis not present

## 2023-04-22 DIAGNOSIS — I7300 Raynaud's syndrome without gangrene: Secondary | ICD-10-CM | POA: Diagnosis not present

## 2023-04-22 DIAGNOSIS — E041 Nontoxic single thyroid nodule: Secondary | ICD-10-CM | POA: Diagnosis not present

## 2023-04-22 DIAGNOSIS — F5104 Psychophysiologic insomnia: Secondary | ICD-10-CM | POA: Diagnosis not present

## 2023-04-22 DIAGNOSIS — J4531 Mild persistent asthma with (acute) exacerbation: Secondary | ICD-10-CM | POA: Diagnosis not present

## 2023-04-22 DIAGNOSIS — R931 Abnormal findings on diagnostic imaging of heart and coronary circulation: Secondary | ICD-10-CM | POA: Diagnosis not present

## 2023-04-22 DIAGNOSIS — I700 Atherosclerosis of aorta: Secondary | ICD-10-CM | POA: Diagnosis not present

## 2023-04-22 DIAGNOSIS — E039 Hypothyroidism, unspecified: Secondary | ICD-10-CM | POA: Diagnosis not present

## 2023-04-25 ENCOUNTER — Other Ambulatory Visit (HOSPITAL_COMMUNITY): Payer: Self-pay

## 2023-04-25 MED ORDER — DEXTROAMPHETAMINE SULFATE 10 MG PO TABS
20.0000 mg | ORAL_TABLET | Freq: Two times a day (BID) | ORAL | 0 refills | Status: DC
  Filled 2023-05-02: qty 360, 90d supply, fill #0

## 2023-04-28 ENCOUNTER — Other Ambulatory Visit (HOSPITAL_COMMUNITY): Payer: Self-pay

## 2023-04-28 MED ORDER — VALACYCLOVIR HCL 500 MG PO TABS
500.0000 mg | ORAL_TABLET | Freq: Two times a day (BID) | ORAL | 0 refills | Status: DC
  Filled 2023-04-28: qty 90, 45d supply, fill #0

## 2023-04-29 ENCOUNTER — Ambulatory Visit (INDEPENDENT_AMBULATORY_CARE_PROVIDER_SITE_OTHER): Payer: Self-pay | Admitting: Plastic Surgery

## 2023-04-29 DIAGNOSIS — Z719 Counseling, unspecified: Secondary | ICD-10-CM

## 2023-04-29 NOTE — Progress Notes (Signed)
Preoperative Dx: hyperpigmentation of face  Postoperative Dx:  same  Procedure: laser to spots of face and then entire neck   Anesthesia: none  Description of Procedure:  Risks and complications were explained to the patient. Consent was confirmed and signed. Eye protection was placed. Time out was called and all information was confirmed to be correct. The area  area was prepped with alcohol and wiped dry. The BBL laser was set at 515 and 560 for the spots on the face and neck at 6-7 J/cm2. The neck was done entirely. The patient tolerated the procedure well and there were no complications. The patient is to follow up in 4 weeks.

## 2023-05-02 ENCOUNTER — Other Ambulatory Visit (HOSPITAL_COMMUNITY): Payer: Self-pay

## 2023-05-02 ENCOUNTER — Other Ambulatory Visit: Payer: Self-pay

## 2023-05-05 ENCOUNTER — Other Ambulatory Visit: Payer: Self-pay

## 2023-05-08 ENCOUNTER — Encounter: Payer: Self-pay | Admitting: Orthopedic Surgery

## 2023-05-08 ENCOUNTER — Other Ambulatory Visit (INDEPENDENT_AMBULATORY_CARE_PROVIDER_SITE_OTHER): Payer: Commercial Managed Care - PPO

## 2023-05-08 ENCOUNTER — Ambulatory Visit: Payer: Commercial Managed Care - PPO | Admitting: Orthopedic Surgery

## 2023-05-08 DIAGNOSIS — M79645 Pain in left finger(s): Secondary | ICD-10-CM | POA: Diagnosis not present

## 2023-05-08 NOTE — Progress Notes (Signed)
Office Visit Note   Patient: Jane Gutierrez           Date of Birth: Aug 12, 1956           MRN: [id number] Visit Date: 05/08/2023 Requested by: Adrian Prince, MD [address] [place],  [place] [address] PCP: Adrian Prince, MD  Subjective: Chief Complaint  Patient presents with   Other    Finger pain and swelling    HPI: Jane Gutierrez is a 67 y.o. female who presents to the office reporting left ring finger PIP joint swelling.  She is considering having her wedding band and engagement bands enlarged.  Denies any popping or clicking or injury to her left hand.  She is undergoing workup for possible vasculitis.  She does have a sister who has a lot of autoimmune diseases..                ROS: All systems reviewed are negative as they relate to the chief complaint within the history of present illness.  Patient denies fevers or chills.  Assessment & Plan: Visit Diagnoses:  1. Finger pain, left     Plan: Impression is left ring finger swelling with very functional joint and no arthritis on radiographs.  Plan is observation for now.  She has mild arthritis in the digits but it really is not restricting her range of motion.  She may follow-up in the fall for possible PRP injections in the hip.  Follow-Up Instructions: No follow-ups on file.   Orders:  Orders Placed This Encounter  Procedures   XR Finger Ring Left   No orders of the defined types were placed in this encounter.     Procedures: No procedures performed   Clinical Data: No additional findings.  Objective: Vital Signs: There were no vitals taken for this visit.  Physical Exam:  Constitutional: Patient appears well-developed HEENT:  Head: Normocephalic Eyes:EOM are normal Neck: Normal range of motion Cardiovascular: Normal rate Pulmonary/chest: Effort normal Neurologic: Patient is alert Skin: Skin is warm Psychiatric: Patient has normal mood and affect  Ortho Exam: Ortho exam demonstrates full  composite motion of the left ring finger.  No real tenderness or asymmetry left PIP joint versus right PIP joint on the ring finger.  Collaterals are stable.  Radial pulse intact.  No paresthesias in the finger.  No masses or cyst palpable around that left fourth finger or PIP joint.  Specialty Comments:  No specialty comments available.  Imaging: No results found.   PMFS History: Patient Active Problem List   Diagnosis Date Noted   Gastroesophageal reflux disease 01/15/2023   Constipation 01/15/2023   Breast pain 05/28/2022   Encounter for counseling 10/16/2021   Hardening of the aorta (main artery of the heart) (HCC) 03/02/2021   Genital herpes simplex 02/22/2021   Family history of diseases of the blood and blood-forming organs and certain disorders involving the immune mechanism 10/21/2019   Polyneuropathy 10/21/2019   Rash and other nonspecific skin eruption 10/21/2019   Raynaud's disease 01/12/2019   Osteoarthritis 11/12/2018   Cough 10/27/2017   Pain in right hand 09/02/2017   Chronic sinusitis 08/18/2017   Wheezing 08/18/2017   Psychophysiologic insomnia 01/06/2017   Pain in left elbow 01/02/2016   Dyspnea 09/11/2015   Abnormal weight loss 08/17/2015   Alopecia 08/17/2015   Fatigue 08/17/2015   Joint pain 08/17/2015   Breast cancer of upper-outer quadrant of left female breast (HCC) 06/28/2015   Nontoxic single thyroid nodule 12/28/2014  Right hip pain 09/19/2014   Right knee pain 09/19/2014   Dyspnea on exertion 08/08/2014   Heart palpitations 08/08/2014   Acquired absence of breast and absent nipple, bilateral 03/09/2014   Myopathy 02/01/2014   Localized swelling, mass and lump, trunk 11/22/2013   Pre-operative cardiovascular examination 09/09/2013   Other chronic pulmonary heart diseases 05/27/2013   SIRS (systemic inflammatory response syndrome) (HCC) 04/14/2013   Anemia 04/14/2013   Dehydration 04/14/2013   Fever 04/14/2013   IBS (irritable bowel  syndrome)    Lyme disease    Asthma    Hypothyroidism    Contact lens/glasses fitting    Hyperlipidemia 11/16/2012   Headache 02/05/2012   Benign neoplasm of colon 09/17/2011   Migraine without aura, not refractory 09/17/2011   Malignant neoplasm of cervix uteri (HCC) 09/06/2009   Personal history of other specified conditions 09/06/2009   Thrombophlebitis of superficial veins of lower extremity 09/06/2009   Varicose veins of other specified sites 09/06/2009   Past Medical History:  Diagnosis Date   Abnormally small mouth    ADD (attention deficit disorder)    ADHD    Allergy    Anxiety    pt reported she doesn't have anxiety   Asthma    prn inhaler   Blood transfusion without reported diagnosis    Breast cancer (HCC)    Dental crowns present    Difficulty swallowing pills    History of breast cancer    History of cervical cancer    History of palpitations    last echo 08/22/2014   Hypothyroidism    IBS (irritable bowel syndrome)     Family History  Problem Relation Age of Onset   Cancer Mother 69       uterine, mult myeloma   CVA Father    Cancer Sister 65       breast   Leukemia Sister        CLL   Lupus Sister        twin sister   Sarcoidosis Sister        twin sister   Rheum arthritis Sister        twin sister   Cancer Maternal Grandmother 43       breast   Celiac disease Daughter    Migraines Daughter     Past Surgical History:  Procedure Laterality Date   ABDOMINAL HYSTERECTOMY     ABDOMINOPLASTY  03/28/2004   AXILLARY SENTINEL NODE BIOPSY  12/31/2012   Procedure: AXILLARY SENTINEL NODE BIOPSY;  Surgeon: Emelia Loron, MD;  Location: [facility];  Service: General;  Laterality: Bilateral;  bilateral sentinel node   BRAIN SURGERY     removal benign frontal lobe cyst in 1987   BREAST ENHANCEMENT SURGERY Bilateral ~ 2008   BREAST IMPLANT EXCHANGE Bilateral 12/11/2017   Procedure: BILATERAL IMPLANT EXCHANGE;  Surgeon: Peggye Form, DO;  Location: [facility];  Service: Plastics;  Laterality: Bilateral;   BREAST RECONSTRUCTION WITH PLACEMENT OF TISSUE EXPANDER AND FLEX HD (ACELLULAR HYDRATED DERMIS)  12/31/2012   Procedure: BREAST RECONSTRUCTION WITH PLACEMENT OF TISSUE EXPANDER AND FLEX HD (ACELLULAR HYDRATED DERMIS);  Surgeon: Wayland Denis, DO;  Location: [facility];  Service: Plastics;  Laterality: Bilateral;  bilateral immediate breast reconstruction with expanders and flex hd    CAPSULOTOMY Left 09/01/2014   Procedure: CAPSULOTOMY OF LEFT BREAST WITH LIPOFILLING FOR SYMMETRY;  Surgeon: Wayland Denis, DO;  Location: [facility];  Service: Plastics;  Laterality: Left;   CERVICAL CERCLAGE     DIAGNOSTIC LAPAROSCOPY  05/12/2003   right partial salpingectomy; lysis paraovarian adhesions left   DILATION AND CURETTAGE OF UTERUS  2005   ENDOMETRIAL FULGURATION  05/12/2003   FACIAL COSMETIC SURGERY  ~ 2010   FOOT SURGERY Bilateral 07/2012   GYNECOLOGIC CRYOSURGERY  1998   INCISION AND DRAINAGE OF WOUND Left 04/28/2013   Procedure: IRRIGATION AND DEBRIDEMENT LEFT BREAST WOUND, POSSIBLE CLOSURE OF WOUND WITH DRAIN PLACEMENT;  Surgeon: Wayland Denis, DO;  Location: [facility];  Service: Plastics;  Laterality: Left;   LATISSIMUS FLAP TO BREAST Left 09/29/2013   Procedure: LATISSIMUS FLAP TO BREAST WITH TISSUE EXPANDER PLACEMENT FOR LEFT BREAST RECONSTRUCTION;  Surgeon: Wayland Denis, DO;  Location: [facility];  Service: Plastics;  Laterality: Left;   LIPECTOMY Bilateral 03/28/2004   hip   LIPOSUCTION Bilateral 03/09/2014   Procedure: LIPOSUCTION ABDOMEN W/ LIPOFILLING LATERAL BILATERAL BREAST;  Surgeon: Wayland Denis, DO;  Location: [facility];  Service: Plastics;  Laterality: Bilateral;  LIPOSUCTION NECK   LIPOSUCTION WITH LIPOFILLING Bilateral 09/01/2014   Procedure: LIPOSUCTION WITH LIPOFILLING;  Surgeon: Wayland Denis, DO;  Location: [facility];   Service: Plastics;  Laterality: Bilateral;   LYSIS OF ADHESION  03/28/2004   pelvic   MASTECTOMY     PORT-A-CATH REMOVAL Right 03/09/2014   Procedure: REMOVAL PORT-A-CATH;  Surgeon: Wayland Denis, DO;  Location: [facility];  Service: Plastics;  Laterality: Right;   PORTACATH PLACEMENT  01/27/2013   Procedure: INSERTION PORT-A-CATH;  Surgeon: Emelia Loron, MD;  Location: [facility];  Service: General;  Laterality: Right;   RADIOFREQUENCY ABLATION NERVES  05/12/2003   uterosacral nerve ablation   REMOVAL OF BILATERAL TISSUE EXPANDERS WITH PLACEMENT OF BILATERAL BREAST IMPLANTS Bilateral 03/09/2014   Procedure: REMOVAL OF BILATERAL TISSUE EXPANDERS WITH PLACEMENT OF BILATERAL BREAST IMPLANTS;  Surgeon: Wayland Denis, DO;  Location: [facility];  Service: Plastics;  Laterality: Bilateral;   TEE WITHOUT CARDIOVERSION N/A 06/04/2013   Procedure: TRANSESOPHAGEAL ECHOCARDIOGRAM (TEE);  Surgeon: Dolores Patty, MD;  Location: [facility];  Service: Cardiovascular;  Laterality: N/A;   TISSUE EXPANDER PLACEMENT Left 06/16/2013   Procedure: LEFT BREAST TISSUE EXPANDER REMOVAL;  Surgeon: Wayland Denis, DO;  Location: [facility];  Service: Plastics;  Laterality: Left;   TISSUE EXPANDER PLACEMENT Left 09/29/2013   Procedure: TISSUE EXPANDER;  Surgeon: Wayland Denis, DO;  Location: [facility];  Service: Plastics;  Laterality: Left;   TOTAL ABDOMINAL HYSTERECTOMY W/ BILATERAL SALPINGOOPHORECTOMY  03/28/2004   TOTAL MASTECTOMY  12/31/2012   Procedure: TOTAL MASTECTOMY;  Surgeon: Emelia Loron, MD;  Location: [facility];  Service: General;  Laterality: Bilateral;  bilateral total mastectomies   Social History   Occupational History   Not on file  Tobacco Use   Smoking status: Never   Smokeless tobacco: Never  Substance and Sexual Activity   Alcohol use: No    Alcohol/week: 0.0 standard drinks of alcohol    Comment: occasionally    Drug use: No   Sexual activity: Yes

## 2023-05-10 ENCOUNTER — Ambulatory Visit (INDEPENDENT_AMBULATORY_CARE_PROVIDER_SITE_OTHER): Payer: Self-pay | Admitting: Plastic Surgery

## 2023-05-10 DIAGNOSIS — Z719 Counseling, unspecified: Secondary | ICD-10-CM

## 2023-05-10 NOTE — Progress Notes (Signed)
Preoperative Dx: rhytides of face  Postoperative Dx:  same  Procedure: laser to face   Anesthesia: none  Description of Procedure:  Risks and complications were explained to the patient. Consent was confirmed and signed. Eye protection was placed. Time out was called and all information was confirmed to be correct. The area  area was prepped with alcohol and wiped dry. The TRL laser was set at 6. The face was lasered. BBL was done with 515 nm and 560 nm at 7 nm for the chest and neck. The patient tolerated the procedure well and there were no complications. The patient is to follow up in 4 weeks.

## 2023-05-13 ENCOUNTER — Other Ambulatory Visit (HOSPITAL_COMMUNITY): Payer: Self-pay

## 2023-05-14 DIAGNOSIS — M79642 Pain in left hand: Secondary | ICD-10-CM | POA: Diagnosis not present

## 2023-05-14 DIAGNOSIS — R5383 Other fatigue: Secondary | ICD-10-CM | POA: Diagnosis not present

## 2023-05-14 DIAGNOSIS — M79641 Pain in right hand: Secondary | ICD-10-CM | POA: Diagnosis not present

## 2023-05-14 DIAGNOSIS — H209 Unspecified iridocyclitis: Secondary | ICD-10-CM | POA: Diagnosis not present

## 2023-05-14 DIAGNOSIS — M1991 Primary osteoarthritis, unspecified site: Secondary | ICD-10-CM | POA: Diagnosis not present

## 2023-05-14 DIAGNOSIS — I7300 Raynaud's syndrome without gangrene: Secondary | ICD-10-CM | POA: Diagnosis not present

## 2023-05-14 DIAGNOSIS — L959 Vasculitis limited to the skin, unspecified: Secondary | ICD-10-CM | POA: Diagnosis not present

## 2023-05-14 DIAGNOSIS — Z6823 Body mass index (BMI) 23.0-23.9, adult: Secondary | ICD-10-CM | POA: Diagnosis not present

## 2023-05-17 ENCOUNTER — Other Ambulatory Visit (HOSPITAL_COMMUNITY): Payer: Self-pay

## 2023-05-23 ENCOUNTER — Ambulatory Visit (INDEPENDENT_AMBULATORY_CARE_PROVIDER_SITE_OTHER): Payer: Self-pay | Admitting: Plastic Surgery

## 2023-05-23 ENCOUNTER — Encounter: Payer: Self-pay | Admitting: Plastic Surgery

## 2023-05-23 DIAGNOSIS — Z719 Counseling, unspecified: Secondary | ICD-10-CM

## 2023-05-23 NOTE — Progress Notes (Signed)
Botulinum Toxin and Filler Injection Procedure Note  Procedure: Cosmetic botulinum toxin and Filler administration  Pre-operative Diagnosis: Dynamic rhytides and midface volume loss  Post-operative Diagnosis: Same  Complications:  None  Brief history: The patient desires botulinum toxin injection of her forehead. I discussed with the patient this proposed procedure of botulinum toxin injections, which is customized depending on the particular needs of the patient. It is performed on facial rhytids as a temporary correction. The alternatives were discussed with the patient. The risks were addressed including bleeding, scarring, infection, damage to deeper structures, asymmetry, and chronic pain, which may occur infrequently after a procedure. The individual's choice to undergo a surgical procedure is based on the comparison of risks to potential benefits. Other risks include unsatisfactory results, brow ptosis, eyelid ptosis, allergic reaction, temporary paralysis, which should go away with time, bruising, blurring disturbances and delayed healing. Botulinum toxin injections do not arrest the aging process or produce permanent tightening of the eyelid.  Operative intervention maybe necessary to maintain the results of a blepharoplasty or botulinum toxin. The patient understands and wishes to proceed.  Procedure: The area was prepped with alcohol and dried with a clean gauze. Using a clean technique, the botulinum toxin was diluted with 1.25 cc of preservative-free normal saline which was slowly injected with an 18 gauge needle in a tuberculin syringes.  A 32 gauge needles were then used to inject the botulinum toxin. This mixture allow for an aliquot of 4 units per 0.1 cc in each injection site.    Subsequently the mixture was injected in the glabellar and forehead area with preservation of the temporal branch to the lateral eyebrow as well as into each lateral canthal area beginning from the lateral  orbital rim medial to the zygomaticus major in 3 separate areas. A total of 30 Units of botulinum toxin was used. The forehead and glabellar area was injected with care to inject intramuscular only while holding pressure on the supratrochlear vessels in each area during each injection on either side of the medial corrugators. The injection proceeded vertically superiorly to the medial 2/3 of the frontalis muscle and superior 2/3 of the lateral frontalis, again with preservation of the frontal branch.  The midface area was injected at the 3 sub-regions of the mid-face: zygomaticomalar region, anteromedial cheek region, and submalar region for a total of one syringe total. The technique used was serial puncture with equal injections in the 3 sub-regions: the zygomaticomalar region, the anteromedial cheek, and the submalar region.  The nasolabial fold was then injected on each side using one syringe total of the Defyne. No complications were noted. Light pressure was held for 5 minutes. She was instructed explicitly in post-operative care.  Botox LOT:  [id number]   Restylane Defyne LOT: [id number]  Restylane Lyft LOT [id number]

## 2023-05-26 ENCOUNTER — Other Ambulatory Visit: Payer: Self-pay

## 2023-05-26 ENCOUNTER — Other Ambulatory Visit (HOSPITAL_COMMUNITY): Payer: Self-pay

## 2023-05-26 MED ORDER — VALACYCLOVIR HCL 500 MG PO TABS
500.0000 mg | ORAL_TABLET | Freq: Two times a day (BID) | ORAL | 0 refills | Status: DC
  Filled 2023-05-26: qty 90, 45d supply, fill #0

## 2023-05-27 ENCOUNTER — Other Ambulatory Visit (HOSPITAL_COMMUNITY): Payer: Self-pay

## 2023-05-27 DIAGNOSIS — B0052 Herpesviral keratitis: Secondary | ICD-10-CM | POA: Diagnosis not present

## 2023-05-27 DIAGNOSIS — H10502 Unspecified blepharoconjunctivitis, left eye: Secondary | ICD-10-CM | POA: Diagnosis not present

## 2023-05-27 MED ORDER — VALACYCLOVIR HCL 1 G PO TABS
1.0000 g | ORAL_TABLET | Freq: Three times a day (TID) | ORAL | 3 refills | Status: DC
  Filled 2023-05-27: qty 30, 10d supply, fill #0
  Filled 2023-06-16: qty 30, 10d supply, fill #1
  Filled 2023-07-03: qty 30, 10d supply, fill #2

## 2023-05-27 MED ORDER — HYOSCYAMINE SULFATE 0.125 MG SL SUBL
0.1250 mg | SUBLINGUAL_TABLET | Freq: Three times a day (TID) | SUBLINGUAL | 3 refills | Status: AC | PRN
  Filled 2023-05-27 – 2023-05-28 (×2): qty 120, 40d supply, fill #0
  Filled 2023-12-31: qty 120, 40d supply, fill #1
  Filled 2024-02-27: qty 120, 40d supply, fill #2
  Filled 2024-04-25: qty 120, 40d supply, fill #3

## 2023-05-27 MED ORDER — ERYTHROMYCIN 5 MG/GM OP OINT
1.0000 | TOPICAL_OINTMENT | Freq: Every day | OPHTHALMIC | 1 refills | Status: DC
  Filled 2023-05-27: qty 3.5, 14d supply, fill #0
  Filled 2023-06-16: qty 3.5, 14d supply, fill #1

## 2023-05-27 MED ORDER — TOBRAMYCIN 0.3 % OP SOLN
1.0000 [drp] | Freq: Three times a day (TID) | OPHTHALMIC | 0 refills | Status: DC
  Filled 2023-05-27: qty 5, 25d supply, fill #0

## 2023-05-28 ENCOUNTER — Other Ambulatory Visit (HOSPITAL_COMMUNITY): Payer: Self-pay

## 2023-05-29 DIAGNOSIS — H1820 Unspecified corneal edema: Secondary | ICD-10-CM | POA: Diagnosis not present

## 2023-05-29 DIAGNOSIS — B0052 Herpesviral keratitis: Secondary | ICD-10-CM | POA: Diagnosis not present

## 2023-06-02 ENCOUNTER — Other Ambulatory Visit: Payer: Self-pay

## 2023-06-02 DIAGNOSIS — H209 Unspecified iridocyclitis: Secondary | ICD-10-CM | POA: Diagnosis not present

## 2023-06-02 DIAGNOSIS — Z6823 Body mass index (BMI) 23.0-23.9, adult: Secondary | ICD-10-CM | POA: Diagnosis not present

## 2023-06-02 DIAGNOSIS — I7300 Raynaud's syndrome without gangrene: Secondary | ICD-10-CM | POA: Diagnosis not present

## 2023-06-02 DIAGNOSIS — M1991 Primary osteoarthritis, unspecified site: Secondary | ICD-10-CM | POA: Diagnosis not present

## 2023-06-02 DIAGNOSIS — L959 Vasculitis limited to the skin, unspecified: Secondary | ICD-10-CM | POA: Diagnosis not present

## 2023-06-03 ENCOUNTER — Other Ambulatory Visit (HOSPITAL_COMMUNITY): Payer: Self-pay

## 2023-06-03 ENCOUNTER — Encounter: Payer: Self-pay | Admitting: Plastic Surgery

## 2023-06-03 ENCOUNTER — Ambulatory Visit (INDEPENDENT_AMBULATORY_CARE_PROVIDER_SITE_OTHER): Payer: Self-pay | Admitting: Plastic Surgery

## 2023-06-03 DIAGNOSIS — B0052 Herpesviral keratitis: Secondary | ICD-10-CM | POA: Diagnosis not present

## 2023-06-03 DIAGNOSIS — Z719 Counseling, unspecified: Secondary | ICD-10-CM

## 2023-06-03 NOTE — Progress Notes (Signed)
Filler Injection Procedure Note  Procedure:  Filler administration  Pre-operative Diagnosis: Rytides   Post-operative Diagnosis: Same  Surgeon: Electronically signed by: Wayland Denis, DO   Complications:  None  Brief history: The patient desires injection with fillers in her face. I discussed with the patient this proposed procedure of filler injections, which is customized depending on the particular needs of the patient. It is performed on facial volume loss as a temporary correction. The alternatives were discussed with the patient. The risks were addressed including bleeding, scarring, infection, damage to deeper structures, asymmetry, and chronic pain, which may occur infrequently after a procedure. The individual's choice to undergo a surgical procedure is based on the comparison of risks to potential benefits. Other risks include unsatisfactory results, allergic reaction, which should go away with time, bruising and delayed healing. Fillers do not arrest the aging process or produce permanent tightening.  Operative intervention maybe necessary to maintain the results. The patient understands and wishes to proceed. An informed consent was signed and informational brochures given to her prior to the procedure.  Procedure: The area was prepped with chlorhexidine and dried with a clean gauze. Using a clean technique, the cannula was used to inject the filler into the nasal labial folds. This was done with one syringe. No complications were noted. Light pressure was held for 5 minutes. She was instructed explicitly in post-operative care.  Volbella XC 1 ml LOT: [id number]

## 2023-06-13 ENCOUNTER — Other Ambulatory Visit (HOSPITAL_COMMUNITY): Payer: Self-pay

## 2023-06-16 ENCOUNTER — Other Ambulatory Visit: Payer: Self-pay

## 2023-06-16 ENCOUNTER — Other Ambulatory Visit (HOSPITAL_COMMUNITY): Payer: Self-pay

## 2023-06-16 MED ORDER — FLUCONAZOLE 150 MG PO TABS
150.0000 mg | ORAL_TABLET | ORAL | 0 refills | Status: DC
  Filled 2023-06-16: qty 2, 7d supply, fill #0

## 2023-06-18 ENCOUNTER — Other Ambulatory Visit (HOSPITAL_COMMUNITY): Payer: Self-pay

## 2023-06-18 MED ORDER — PANTOPRAZOLE SODIUM 40 MG PO TBEC
40.0000 mg | DELAYED_RELEASE_TABLET | Freq: Every day | ORAL | 3 refills | Status: DC
  Filled 2023-06-18: qty 90, 90d supply, fill #0

## 2023-06-24 ENCOUNTER — Other Ambulatory Visit (HOSPITAL_COMMUNITY): Payer: Self-pay

## 2023-06-25 ENCOUNTER — Other Ambulatory Visit (HOSPITAL_COMMUNITY): Payer: Self-pay

## 2023-06-25 MED ORDER — TOBRAMYCIN 0.3 % OP SOLN
1.0000 [drp] | Freq: Two times a day (BID) | OPHTHALMIC | 0 refills | Status: DC
  Filled 2023-06-25: qty 5, 14d supply, fill #0

## 2023-07-03 ENCOUNTER — Other Ambulatory Visit (HOSPITAL_COMMUNITY): Payer: Self-pay

## 2023-07-03 DIAGNOSIS — Z86010 Personal history of colonic polyps: Secondary | ICD-10-CM | POA: Diagnosis not present

## 2023-07-03 DIAGNOSIS — K219 Gastro-esophageal reflux disease without esophagitis: Secondary | ICD-10-CM | POA: Diagnosis not present

## 2023-07-03 DIAGNOSIS — R141 Gas pain: Secondary | ICD-10-CM | POA: Diagnosis not present

## 2023-07-03 DIAGNOSIS — R194 Change in bowel habit: Secondary | ICD-10-CM | POA: Diagnosis not present

## 2023-07-03 MED ORDER — HYOSCYAMINE SULFATE 0.125 MG SL SUBL
0.1250 mg | SUBLINGUAL_TABLET | Freq: Three times a day (TID) | SUBLINGUAL | 2 refills | Status: DC | PRN
  Filled 2023-07-03: qty 90, 30d supply, fill #0

## 2023-07-04 DIAGNOSIS — B0052 Herpesviral keratitis: Secondary | ICD-10-CM | POA: Diagnosis not present

## 2023-07-04 DIAGNOSIS — H16103 Unspecified superficial keratitis, bilateral: Secondary | ICD-10-CM | POA: Diagnosis not present

## 2023-07-07 ENCOUNTER — Other Ambulatory Visit: Payer: Self-pay | Admitting: Oncology

## 2023-07-07 DIAGNOSIS — Z006 Encounter for examination for normal comparison and control in clinical research program: Secondary | ICD-10-CM

## 2023-07-08 ENCOUNTER — Ambulatory Visit (INDEPENDENT_AMBULATORY_CARE_PROVIDER_SITE_OTHER): Payer: Commercial Managed Care - PPO | Admitting: Podiatry

## 2023-07-08 ENCOUNTER — Encounter: Payer: Self-pay | Admitting: Podiatry

## 2023-07-08 DIAGNOSIS — S90221A Contusion of right lesser toe(s) with damage to nail, initial encounter: Secondary | ICD-10-CM | POA: Diagnosis not present

## 2023-07-08 DIAGNOSIS — M2061 Acquired deformities of toe(s), unspecified, right foot: Secondary | ICD-10-CM | POA: Diagnosis not present

## 2023-07-08 DIAGNOSIS — M2062 Acquired deformities of toe(s), unspecified, left foot: Secondary | ICD-10-CM | POA: Diagnosis not present

## 2023-07-08 DIAGNOSIS — H16002 Unspecified corneal ulcer, left eye: Secondary | ICD-10-CM | POA: Insufficient documentation

## 2023-07-08 NOTE — Progress Notes (Signed)
Chief Complaint  Patient presents with   Toe Pain    Follow up hammertoes bilateral - wanting to know options, not painful, would like to avoid surgery   Nail Problem    2nd toe right - darkened nail, doesn't remember an injury   New Patient (Initial Visit)    Est pt 2022    HPI: 67 y.o. female presents today wanting to know of conservative options for hammertoes to the lesser toes bilateral.  She is not interested in surgical correction at this time.  Also notes discoloration to the right second toenail.  Does not recall any specific injury.  Past Medical History:  Diagnosis Date   Abnormally small mouth    ADD (attention deficit disorder)    ADHD    Allergy    Anxiety    pt reported she doesn't have anxiety   Asthma    prn inhaler   Blood transfusion without reported diagnosis    Breast cancer (HCC)    Dental crowns present    Difficulty swallowing pills    History of breast cancer    History of cervical cancer    History of palpitations    last echo 08/22/2014   Hypothyroidism    IBS (irritable bowel syndrome)     Past Surgical History:  Procedure Laterality Date   ABDOMINAL HYSTERECTOMY     ABDOMINOPLASTY  03/28/2004   AXILLARY SENTINEL NODE BIOPSY  12/31/2012   Procedure: AXILLARY SENTINEL NODE BIOPSY;  Surgeon: Emelia Loron, MD;  Location: [facility];  Service: General;  Laterality: Bilateral;  bilateral sentinel node   BRAIN SURGERY     removal benign frontal lobe cyst in 1987   BREAST ENHANCEMENT SURGERY Bilateral ~ 2008   BREAST IMPLANT EXCHANGE Bilateral 12/11/2017   Procedure: BILATERAL IMPLANT EXCHANGE;  Surgeon: Peggye Form, DO;  Location: [facility];  Service: Plastics;  Laterality: Bilateral;   BREAST RECONSTRUCTION WITH PLACEMENT OF TISSUE EXPANDER AND FLEX HD (ACELLULAR HYDRATED DERMIS)  12/31/2012   Procedure: BREAST RECONSTRUCTION WITH PLACEMENT OF TISSUE EXPANDER AND FLEX HD (ACELLULAR HYDRATED DERMIS);   Surgeon: Wayland Denis, DO;  Location: [facility];  Service: Plastics;  Laterality: Bilateral;  bilateral immediate breast reconstruction with expanders and flex hd    CAPSULOTOMY Left 09/01/2014   Procedure: CAPSULOTOMY OF LEFT BREAST WITH LIPOFILLING FOR SYMMETRY;  Surgeon: Wayland Denis, DO;  Location: [facility];  Service: Plastics;  Laterality: Left;   CERVICAL CERCLAGE     DIAGNOSTIC LAPAROSCOPY  05/12/2003   right partial salpingectomy; lysis paraovarian adhesions left   DILATION AND CURETTAGE OF UTERUS  2005   ENDOMETRIAL FULGURATION  05/12/2003   FACIAL COSMETIC SURGERY  ~ 2010   FOOT SURGERY Bilateral 07/2012   GYNECOLOGIC CRYOSURGERY  1998   INCISION AND DRAINAGE OF WOUND Left 04/28/2013   Procedure: IRRIGATION AND DEBRIDEMENT LEFT BREAST WOUND, POSSIBLE CLOSURE OF WOUND WITH DRAIN PLACEMENT;  Surgeon: Wayland Denis, DO;  Location: [facility];  Service: Plastics;  Laterality: Left;   LATISSIMUS FLAP TO BREAST Left 09/29/2013   Procedure: LATISSIMUS FLAP TO BREAST WITH TISSUE EXPANDER PLACEMENT FOR LEFT BREAST RECONSTRUCTION;  Surgeon: Wayland Denis, DO;  Location: [facility];  Service: Plastics;  Laterality: Left;   LIPECTOMY Bilateral 03/28/2004   hip   LIPOSUCTION Bilateral 03/09/2014   Procedure: LIPOSUCTION ABDOMEN W/ LIPOFILLING LATERAL BILATERAL BREAST;  Surgeon: Wayland Denis, DO;  Location: [facility];  Service: Plastics;  Laterality:  Bilateral;  LIPOSUCTION NECK   LIPOSUCTION WITH LIPOFILLING Bilateral 09/01/2014   Procedure: LIPOSUCTION WITH LIPOFILLING;  Surgeon: Wayland Denis, DO;  Location: [facility];  Service: Plastics;  Laterality: Bilateral;   LYSIS OF ADHESION  03/28/2004   pelvic   MASTECTOMY     PORT-A-CATH REMOVAL Right 03/09/2014   Procedure: REMOVAL PORT-A-CATH;  Surgeon: Wayland Denis, DO;  Location: [facility];  Service: Plastics;  Laterality: Right;   PORTACATH PLACEMENT  01/27/2013    Procedure: INSERTION PORT-A-CATH;  Surgeon: Emelia Loron, MD;  Location: [facility];  Service: General;  Laterality: Right;   RADIOFREQUENCY ABLATION NERVES  05/12/2003   uterosacral nerve ablation   REMOVAL OF BILATERAL TISSUE EXPANDERS WITH PLACEMENT OF BILATERAL BREAST IMPLANTS Bilateral 03/09/2014   Procedure: REMOVAL OF BILATERAL TISSUE EXPANDERS WITH PLACEMENT OF BILATERAL BREAST IMPLANTS;  Surgeon: Wayland Denis, DO;  Location: [facility];  Service: Plastics;  Laterality: Bilateral;   TEE WITHOUT CARDIOVERSION N/A 06/04/2013   Procedure: TRANSESOPHAGEAL ECHOCARDIOGRAM (TEE);  Surgeon: Dolores Patty, MD;  Location: [facility];  Service: Cardiovascular;  Laterality: N/A;   TISSUE EXPANDER PLACEMENT Left 06/16/2013   Procedure: LEFT BREAST TISSUE EXPANDER REMOVAL;  Surgeon: Wayland Denis, DO;  Location[facility];  Service: Plastics;  Laterality: Left;   TISSUE EXPANDER PLACEMENT Left 09/29/2013   Procedure: TISSUE EXPANDER;  Surgeon: Wayland Denis, DO;  Location[facility];  Service: Plastics;  Laterality: Left;   TOTAL ABDOMINAL HYSTERECTOMY W/ BILATERAL SALPINGOOPHORECTOMY  03/28/2004   TOTAL MASTECTOMY  12/31/2012   Procedure: TOTAL MASTECTOMY;  Surgeon: Emelia Loron, MD;  Location: [facility];  Service: General;  Laterality: Bilateral;  bilateral total mastectomies    Allergies  Allergen Reactions   Betadine [Povidone Iodine] Anaphylaxis   Doxycycline Rash   Iodine Anaphylaxis   Shellfish-Derived Products Anaphylaxis   Gluten Meal     Other Reaction(s): Unknown   Omeprazole     Other Reaction(s): Unknown   Povidone-Iodine Other (See Comments)   Rosuvastatin Calcium Other (See Comments)    GI symptoms   Amoxicillin Rash   Ginger Rash   Penicillins Rash   Sulfamethoxazole-Trimethoprim Rash    Physical Exam: There were no vitals filed for this visit.  General: The patient is alert and oriented x3 in no acute  distress.  Dermatology: Skin is warm, dry and supple bilateral lower extremities. Interspaces are clear of maceration and debris.  Right second toenail has ecchymotic discoloration subungually.  The nail is not lytic.  There is no surrounding erythema or signs of infection.  Vascular: Palpable pedal pulses bilaterally. Capillary refill within normal limits.  No appreciable edema.  No erythema or calor.  Neurological: Light touch sensation grossly intact bilateral feet.   Musculoskeletal Exam: Adductovarus deformity fourth and fifth toes.  The left fourth toe has some mild elevation to it as well with the prominent extensor tendon.  There is not any overpowering of the flexor tendon specifically on this toe.  Assessment/Plan of Care: 1. Acquired deformity of right toe   2. Acquired deformity of left toe   3. Subungual hematoma of toe of right foot, initial encounter     Discussed clinical findings with patient today.  Discussed various hammertoe regulator splints with the patient today.  She was dispensed a single loop hammertoe regulator to try.  She is instructed to use this when she is weightbearing in shoe gear.  It will not work as well if she tries  to wear it while barefoot.  If this is not effective, she may opt to try a percutaneous extensor tenotomy on the left fourth toe to address the elevation and some of the contracture.  She was informed if she tried this the toe will not sit perfectly straight but will sit in a much improved position and it is currently.  This could be performed in the office under local anesthesia.  Regarding the right second toenail, informed the patient that this is a bruise nail and the bruising has stained the underside of the toenail.  The nail will need to grow out so that the ecchymotic discoloration will grow out with the nail and will simply be trimmed away.  If the nail ever does become loose or painful in the future she can call for an appointment so that  we can safely remove the nail.   Clerance Lav, DPM, FACFAS [facility]     [address] [place], [place] [address]                Office [phone]  Fax [phone]

## 2023-07-24 ENCOUNTER — Other Ambulatory Visit (HOSPITAL_COMMUNITY)
Admission: RE | Admit: 2023-07-24 | Discharge: 2023-07-24 | Disposition: A | Discharge status: Home / Self Care | Payer: Commercial Managed Care - PPO | Attending: Oncology | Admitting: Oncology

## 2023-07-24 DIAGNOSIS — Z006 Encounter for examination for normal comparison and control in clinical research program: Secondary | ICD-10-CM | POA: Insufficient documentation

## 2023-07-29 ENCOUNTER — Other Ambulatory Visit: Payer: Self-pay | Admitting: Oncology

## 2023-07-29 DIAGNOSIS — Z139 Encounter for screening, unspecified: Secondary | ICD-10-CM

## 2023-07-30 ENCOUNTER — Other Ambulatory Visit (HOSPITAL_COMMUNITY)
Admission: RE | Admit: 2023-07-30 | Discharge: 2023-07-30 | Disposition: A | Discharge status: Home / Self Care | Payer: Commercial Managed Care - PPO | Source: Ambulatory Visit | Attending: Oncology | Admitting: Oncology

## 2023-07-30 DIAGNOSIS — Z139 Encounter for screening, unspecified: Secondary | ICD-10-CM | POA: Insufficient documentation

## 2023-07-31 ENCOUNTER — Other Ambulatory Visit (HOSPITAL_COMMUNITY): Payer: Self-pay

## 2023-07-31 ENCOUNTER — Other Ambulatory Visit: Payer: Self-pay | Admitting: Adult Health

## 2023-07-31 DIAGNOSIS — G4720 Circadian rhythm sleep disorder, unspecified type: Secondary | ICD-10-CM

## 2023-07-31 MED ORDER — DEXTROAMPHETAMINE SULFATE 10 MG PO TABS
20.0000 mg | ORAL_TABLET | Freq: Two times a day (BID) | ORAL | 0 refills | Status: DC
  Filled 2023-07-31: qty 360, 90d supply, fill #0

## 2023-07-31 MED ORDER — ESZOPICLONE 1 MG PO TABS
1.0000 mg | ORAL_TABLET | Freq: Every evening | ORAL | 5 refills | Status: DC | PRN
Start: 2023-07-31 — End: 2023-10-29
  Filled 2023-07-31: qty 30, 30d supply, fill #0
  Filled 2023-08-31: qty 30, 30d supply, fill #1

## 2023-08-01 ENCOUNTER — Other Ambulatory Visit: Payer: Self-pay

## 2023-08-01 ENCOUNTER — Other Ambulatory Visit (HOSPITAL_COMMUNITY): Payer: Self-pay

## 2023-08-01 MED ORDER — EPINEPHRINE 0.3 MG/0.3ML IJ SOAJ
INTRAMUSCULAR | 1 refills | Status: AC
  Filled 2023-08-01: qty 2, 1d supply, fill #0
  Filled 2023-08-21: qty 2, 1d supply, fill #1

## 2023-08-02 ENCOUNTER — Telehealth: Payer: Commercial Managed Care - PPO | Admitting: Family Medicine

## 2023-08-02 DIAGNOSIS — B3731 Acute candidiasis of vulva and vagina: Secondary | ICD-10-CM | POA: Diagnosis not present

## 2023-08-02 MED ORDER — FLUCONAZOLE 150 MG PO TABS: 150.0000 mg | ORAL_TABLET | Freq: Once | ORAL | 0 refills | Status: AC

## 2023-08-02 NOTE — Progress Notes (Signed)

## 2023-08-11 ENCOUNTER — Other Ambulatory Visit (HOSPITAL_COMMUNITY): Payer: Self-pay

## 2023-08-11 DIAGNOSIS — N760 Acute vaginitis: Secondary | ICD-10-CM | POA: Diagnosis not present

## 2023-08-11 DIAGNOSIS — Z01419 Encounter for gynecological examination (general) (routine) without abnormal findings: Secondary | ICD-10-CM | POA: Diagnosis not present

## 2023-08-11 DIAGNOSIS — Z6824 Body mass index (BMI) 24.0-24.9, adult: Secondary | ICD-10-CM | POA: Diagnosis not present

## 2023-08-11 DIAGNOSIS — N390 Urinary tract infection, site not specified: Secondary | ICD-10-CM | POA: Diagnosis not present

## 2023-08-11 MED ORDER — FLUCONAZOLE 150 MG PO TABS
150.0000 mg | ORAL_TABLET | ORAL | 3 refills | Status: DC
  Filled 2023-08-11: qty 2, 7d supply, fill #0
  Filled 2023-08-21: qty 2, 7d supply, fill #1
  Filled 2023-08-31: qty 2, 7d supply, fill #2
  Filled 2023-10-13: qty 2, 7d supply, fill #3

## 2023-08-11 MED ORDER — PHENAZOPYRIDINE HCL 200 MG PO TABS
200.0000 mg | ORAL_TABLET | Freq: Three times a day (TID) | ORAL | 2 refills | Status: AC
Start: 2023-08-11 — End: ?
  Filled 2023-08-11: qty 6, 2d supply, fill #0
  Filled 2023-08-21: qty 6, 2d supply, fill #1
  Filled 2023-08-31: qty 6, 2d supply, fill #2

## 2023-08-13 DIAGNOSIS — H04122 Dry eye syndrome of left lacrimal gland: Secondary | ICD-10-CM | POA: Diagnosis not present

## 2023-08-14 LAB — HELIX MOLECULAR SCREEN: Genetic Analysis Overall Interpretation: NEGATIVE

## 2023-08-19 ENCOUNTER — Ambulatory Visit (INDEPENDENT_AMBULATORY_CARE_PROVIDER_SITE_OTHER): Payer: Self-pay | Admitting: Plastic Surgery

## 2023-08-19 DIAGNOSIS — Z719 Counseling, unspecified: Secondary | ICD-10-CM

## 2023-08-19 NOTE — Progress Notes (Signed)

## 2023-08-26 ENCOUNTER — Other Ambulatory Visit (HOSPITAL_COMMUNITY): Payer: Self-pay

## 2023-08-26 DIAGNOSIS — H10413 Chronic giant papillary conjunctivitis, bilateral: Secondary | ICD-10-CM | POA: Diagnosis not present

## 2023-08-29 ENCOUNTER — Other Ambulatory Visit (HOSPITAL_BASED_OUTPATIENT_CLINIC_OR_DEPARTMENT_OTHER): Payer: Self-pay

## 2023-08-29 ENCOUNTER — Telehealth: Payer: Medicare HMO | Admitting: Physician Assistant

## 2023-08-29 DIAGNOSIS — H6501 Acute serous otitis media, right ear: Secondary | ICD-10-CM

## 2023-08-29 MED ORDER — AZITHROMYCIN 250 MG PO TABS
ORAL_TABLET | ORAL | 0 refills | Status: AC
Start: 2023-08-29 — End: 2023-09-03
  Filled 2023-08-29: qty 6, 5d supply, fill #0

## 2023-08-29 MED ORDER — NEOMYCIN-POLYMYXIN-HC 3.5-10000-1 OT SOLN
3.0000 [drp] | Freq: Four times a day (QID) | OTIC | 0 refills | Status: DC
Start: 2023-08-29 — End: 2023-10-29
  Filled 2023-08-29: qty 10, 17d supply, fill #0

## 2023-08-29 NOTE — Progress Notes (Signed)
E-Visit for Ear Pain - Acute Otitis Media   We are sorry that you are not feeling well. Here is how we plan to help!  Based on what you have shared with me it looks like you have Acute Otitis Media.  Acute Otitis Media is an infection of the middle or "inner" ear. This type of infection can cause redness, inflammation, and fluid buildup behind the tympanic membrane (ear drum).  The usual symptoms include: Earache/Pain Fever Upper respiratory symptoms Lack of energy/Fatigue/Malaise Slight hearing loss gradually worsening- if the inner ear fills with fluid What causes middle ear infections? Most middle ear infections occur when an infection such as a cold, leads to a build-up of mucus in the middle ear and causes the Eustachian tube (a thin tube that runs from the middle ear to the back of the nose) to become swollen or blocked.   This means mucus can't drain away properly, making it easier for an infection to spread into the middle ear.  How middle ear infections are treated: Most ear infections clear up within three to five days and don't need any specific treatment. If necessary, tylenol or ibuprofen should be used to relieve pain and a high temperature.  If you develop a fever higher than 102, or any significantly worsening symptoms, this could indicate a more serious infection moving to the middle/inner and needs face to face evaluation in an office by a provider.   Antibiotics aren't routinely used to treat middle ear infections, although they may occasionally be prescribed if symptoms persist or are particularly severe. Given your presentation,   I have prescribed Azithromycin 250 mg two tablets by mouth on day 1, then 1 tablet by mouth daily until completed  I have prescribed: Neomycin 0.35%, polymyxin B 10,000 units/mL, and hydrocortisone 0,5% otic solution 4 drops in affected ears four times a day for 7 days   Your symptoms should improve over the next 3 days and should resolve in  about 7 days. Be sure to complete ALL of the prescription(s) given.  HOME CARE: Wash your hands frequently. If you are prescribed an ear drop, do not place the tip of the bottle on your ear or touch it with your fingers. You can take Acetaminophen 650 mg every 4-6 hours as needed for pain.  If pain is severe or moderate, you can apply a heating pad (set on low) or hot water bottle (wrapped in a towel) to outer ear for 20 minutes.  This will also increase drainage.  GET HELP RIGHT AWAY IF: Fever is over 102.2 degrees. You develop progressive ear pain or hearing loss. Ear symptoms persist longer than 3 days after treatment.  MAKE SURE YOU: Understand these instructions. Will watch your condition. Will get help right away if you are not doing well or get worse.  Thank you for choosing an e-visit.  Your e-visit answers were reviewed by a board certified advanced clinical practitioner to complete your personal care plan. Depending upon the condition, your plan could have included both over the counter or prescription medications.  Please review your pharmacy choice. Make sure the pharmacy is open so you can pick up the prescription now. If there is a problem, you may contact your provider through [employer] and have the prescription routed to another pharmacy.  Your safety is important to [place]. If you have drug allergies check your prescription carefully.   For the next 24 hours you can use MyChart to ask questions about today's visit,  request a non-urgent call back, or ask for a work or school excuse. You will get an email with a survey after your eVisit asking about your experience. We would appreciate your feedback. I  that your e-visit has been valuable and will aid in your recovery.   I have spent 5 minutes in review of e-visit questionnaire, review and updating patient chart, medical decision making and response to patient.   Margaretann Loveless, PA-C

## 2023-08-31 ENCOUNTER — Other Ambulatory Visit (HOSPITAL_BASED_OUTPATIENT_CLINIC_OR_DEPARTMENT_OTHER): Payer: Self-pay

## 2023-09-01 ENCOUNTER — Other Ambulatory Visit (HOSPITAL_BASED_OUTPATIENT_CLINIC_OR_DEPARTMENT_OTHER): Payer: Self-pay

## 2023-09-01 ENCOUNTER — Other Ambulatory Visit: Payer: Self-pay

## 2023-09-01 MED ORDER — VALACYCLOVIR HCL 500 MG PO TABS
500.0000 mg | ORAL_TABLET | Freq: Two times a day (BID) | ORAL | 0 refills | Status: DC
  Filled 2023-09-01: qty 90, 45d supply, fill #0

## 2023-09-02 ENCOUNTER — Other Ambulatory Visit (HOSPITAL_BASED_OUTPATIENT_CLINIC_OR_DEPARTMENT_OTHER): Payer: Self-pay

## 2023-09-09 ENCOUNTER — Other Ambulatory Visit (HOSPITAL_BASED_OUTPATIENT_CLINIC_OR_DEPARTMENT_OTHER): Payer: Self-pay

## 2023-09-09 MED ORDER — FLUAD 0.5 ML IM SUSY
PREFILLED_SYRINGE | INTRAMUSCULAR | 0 refills | Status: DC
  Filled 2023-09-09: qty 0.5, 1d supply, fill #0

## 2023-09-15 ENCOUNTER — Other Ambulatory Visit (HOSPITAL_BASED_OUTPATIENT_CLINIC_OR_DEPARTMENT_OTHER): Payer: Self-pay

## 2023-09-18 ENCOUNTER — Other Ambulatory Visit (HOSPITAL_BASED_OUTPATIENT_CLINIC_OR_DEPARTMENT_OTHER): Payer: Self-pay

## 2023-09-18 ENCOUNTER — Other Ambulatory Visit (HOSPITAL_COMMUNITY): Payer: Self-pay

## 2023-09-18 ENCOUNTER — Encounter: Payer: Self-pay | Admitting: Plastic Surgery

## 2023-09-18 DIAGNOSIS — H52203 Unspecified astigmatism, bilateral: Secondary | ICD-10-CM | POA: Diagnosis not present

## 2023-09-18 DIAGNOSIS — H16202 Unspecified keratoconjunctivitis, left eye: Secondary | ICD-10-CM | POA: Diagnosis not present

## 2023-09-18 DIAGNOSIS — H0100B Unspecified blepharitis left eye, upper and lower eyelids: Secondary | ICD-10-CM | POA: Diagnosis not present

## 2023-09-18 DIAGNOSIS — H532 Diplopia: Secondary | ICD-10-CM | POA: Diagnosis not present

## 2023-09-18 MED ORDER — AZITHROMYCIN 250 MG PO TABS
ORAL_TABLET | ORAL | 2 refills | Status: DC
  Filled 2023-09-18: qty 6, 5d supply, fill #0
  Filled 2023-10-13: qty 6, 5d supply, fill #1

## 2023-09-22 ENCOUNTER — Other Ambulatory Visit (HOSPITAL_COMMUNITY): Payer: Self-pay

## 2023-09-22 ENCOUNTER — Other Ambulatory Visit: Payer: Self-pay | Admitting: Plastic Surgery

## 2023-09-22 ENCOUNTER — Telehealth: Payer: Self-pay | Admitting: Neurology

## 2023-09-22 DIAGNOSIS — R69 Illness, unspecified: Secondary | ICD-10-CM | POA: Diagnosis not present

## 2023-09-22 MED ORDER — LIDOCAINE 23% - TETRACAINE 7% TOPICAL OINTMENT (PLASTICIZED)
1.0000 | TOPICAL_OINTMENT | Freq: Once | CUTANEOUS | 0 refills | Status: AC
  Filled 2023-09-22: qty 60, 20d supply, fill #0

## 2023-09-22 NOTE — Telephone Encounter (Signed)
Received voicemail from [place] at [facility] dr. Dellia Nims office. States pt is having new onset diplopia and needs appt. Called patient and she said she is having double vision, eye dropping and Dr. Baron Hamper for Myasthenia Gravis, I let her know I will need to get those office notes prior to getting her schedule so we know how urgent this appt needs to be. Called and LVM for Becky at [facility] requested referral be sent over for new symptoms.

## 2023-09-22 NOTE — Progress Notes (Signed)
Cream for laser sent to [facility] pharmacy

## 2023-09-26 ENCOUNTER — Encounter: Payer: Self-pay | Admitting: Plastic Surgery

## 2023-09-26 ENCOUNTER — Ambulatory Visit (INDEPENDENT_AMBULATORY_CARE_PROVIDER_SITE_OTHER): Payer: Self-pay | Admitting: Plastic Surgery

## 2023-09-26 ENCOUNTER — Telehealth: Payer: Self-pay | Admitting: *Deleted

## 2023-09-26 DIAGNOSIS — Z719 Counseling, unspecified: Secondary | ICD-10-CM

## 2023-09-26 NOTE — Progress Notes (Signed)
Preoperative Dx: hyperpigmentation of face  Postoperative Dx:  same  Procedure: laser to face   Anesthesia: none  Description of Procedure:  Risks and complications were explained to the patient. Consent was confirmed and signed. Eye protection was placed. Time out was called and all information was confirmed to be correct. The area  area was prepped with alcohol and wiped dry. The Heroic laser was set at presettings with 532 nm. The face, neck, chest and hand were lasered. The patient tolerated the procedure well and there were no complications. The patient is to follow up in 4 weeks.

## 2023-09-26 NOTE — Telephone Encounter (Addendum)
Called and spoke with the patient and informed her of an appointment with Dr. Anders Grant for (Wed-April 2 @ 10:15am).  Patient verbalized understanding and agreed.    Gave the patient Dr. Arnetha Gula office phone number://[phone]

## 2023-10-02 ENCOUNTER — Other Ambulatory Visit (HOSPITAL_BASED_OUTPATIENT_CLINIC_OR_DEPARTMENT_OTHER): Payer: Self-pay

## 2023-10-02 DIAGNOSIS — N952 Postmenopausal atrophic vaginitis: Secondary | ICD-10-CM | POA: Diagnosis not present

## 2023-10-02 MED ORDER — NYSTATIN-TRIAMCINOLONE 100000-0.1 UNIT/GM-% EX OINT
TOPICAL_OINTMENT | CUTANEOUS | 0 refills | Status: DC
  Filled 2023-10-02: qty 30, 30d supply, fill #0

## 2023-10-14 ENCOUNTER — Other Ambulatory Visit (HOSPITAL_BASED_OUTPATIENT_CLINIC_OR_DEPARTMENT_OTHER): Payer: Self-pay

## 2023-10-14 ENCOUNTER — Other Ambulatory Visit: Payer: Self-pay

## 2023-10-20 ENCOUNTER — Other Ambulatory Visit (HOSPITAL_BASED_OUTPATIENT_CLINIC_OR_DEPARTMENT_OTHER): Payer: Self-pay

## 2023-10-20 ENCOUNTER — Other Ambulatory Visit: Payer: Self-pay

## 2023-10-29 ENCOUNTER — Ambulatory Visit: Payer: Medicare HMO | Admitting: Neurology

## 2023-10-29 ENCOUNTER — Encounter: Payer: Self-pay | Admitting: Neurology

## 2023-10-29 VITALS — BP 151/91 | HR 73 | Ht 64.5 in | Wt 144.0 lb

## 2023-10-29 DIAGNOSIS — N766 Ulceration of vulva: Secondary | ICD-10-CM

## 2023-10-29 DIAGNOSIS — H532 Diplopia: Secondary | ICD-10-CM | POA: Diagnosis not present

## 2023-10-29 DIAGNOSIS — R21 Rash and other nonspecific skin eruption: Secondary | ICD-10-CM | POA: Diagnosis not present

## 2023-10-29 DIAGNOSIS — H02402 Unspecified ptosis of left eyelid: Secondary | ICD-10-CM

## 2023-10-29 DIAGNOSIS — Z86718 Personal history of other venous thrombosis and embolism: Secondary | ICD-10-CM | POA: Diagnosis not present

## 2023-10-29 DIAGNOSIS — L959 Vasculitis limited to the skin, unspecified: Secondary | ICD-10-CM

## 2023-10-29 DIAGNOSIS — K121 Other forms of stomatitis: Secondary | ICD-10-CM | POA: Diagnosis not present

## 2023-10-29 DIAGNOSIS — I776 Arteritis, unspecified: Secondary | ICD-10-CM

## 2023-10-29 DIAGNOSIS — H5462 Unqualified visual loss, left eye, normal vision right eye: Secondary | ICD-10-CM | POA: Diagnosis not present

## 2023-10-29 DIAGNOSIS — G08 Intracranial and intraspinal phlebitis and thrombophlebitis: Secondary | ICD-10-CM | POA: Diagnosis not present

## 2023-10-29 DIAGNOSIS — H5789 Other specified disorders of eye and adnexa: Secondary | ICD-10-CM

## 2023-10-29 NOTE — Patient Instructions (Addendum)
MRI brain and orbits w/wo contrast: Best way to look at the brain and behind the eyes CTA of the head and neck - best way to look at blood vessels Blood work:  Ct Chest w contrast Look at the veins? Will see if MRI or CT better and which we could add.   Behcet Syndrome, Adult Behcet syndrome is a long-term condition that causes inflammation of blood vessels (vasculitis). This can lead to open sores on the skin (ulcers) or painful mouth sores called canker sores. Canker sores may appear anywhere in the mouth.  Behcet syndrome may also affect other parts of the body. These parts include the eyes, joints, genitals, heart, lungs, nervous system, and digestive system. What are the causes? Many times, the cause of this condition is not known. In rare cases, it may be passed down from parent to child (inherited) through an abnormal gene. This gene may cause your body's disease-fighting system (immune system) to attack blood vessels by mistake. In most cases, the condition occurs without a family history of it. What increases the risk? You may be more likely to develop this condition if: You are between 18 and 18 years old. You have had an infection. This may trigger Behcet syndrome. You have a parent who carries the gene that is linked to the condition. This is rare. What are the signs or symptoms? Symptoms of this condition may come and go. They can range from mild to severe. Symptoms may get worse for a period of time. This is called a flare-up. Common symptoms include: Canker sores. Skin and genital ulcers. Painful skin bumps or acne. Eye inflammation. This can cause eye pain, redness, tearing, and blurred vision. Joint pain and inflammation (arthritis). Less common symptoms include: Inflammation of the digestive system. This can cause nausea, pain in the abdomen, diarrhea, or bloody diarrhea. Brain inflammation. This can cause headaches and clumsiness. Large blood vessel inflammation. This  can cause blood clots. Lung or heart inflammation. This can cause breathing trouble or chest pain. How is this diagnosed? This condition is diagnosed based on your symptoms, medical history, and a physical exam. Your health care provider may also do a skin test. This involves pricking the skin with a needle to see if small red bumps form (pathergy test). This condition can be hard to diagnose because symptoms can come and go. How is this treated? There is no cure for this condition. Treatment can help you manage your symptoms. Treatment may include steroid medicines, such as: Creams or ointments for oral, genital, or other skin ulcers. Numbing mouthwash for painful mouth ulcers. Eye drops for eye symptoms. Medicines taken by mouth to treat a flare-up of symptoms. Other medicines used to treat symptoms of this condition may include: NSAIDs, such as ibuprofen. These can help with pain and swelling. Immunosuppressive medicines. These are medicines that suppress your immune system response. They may be used if your symptoms are severe. TNF inhibitors. These medicines are used for severe symptoms that do not respond to other treatments. In some cases, you may be able to take part in clinical trials to see if new treatments are effective. Talk with your health care provider about your options. Follow these instructions at home: Activity Rest during flare-ups until symptoms improve. Get regular exercise. Ask your health care provider what type of exercise is best for you. Your health care provider may recommend low-impact exercises, such as walking or swimming. Return to your normal activities as told by your health care  provider. Ask your health care provider what activities are safe for you. General instructions Learn as much as you can about your condition. Think about joining a support group. Talk to other people who have your condition. This may help you manage the illness. Tell your health care  provider if you feel stressed, anxious, or depressed. Take or apply over-the-counter and prescription medicines only as told by your health care provider. Eat a healthy diet. Make sure your diet includes fruits, vegetables, whole grains, and healthy sources of protein. Keep all follow-up visits. Your health care provider may need to adjust your treatment over time. Work closely with your health care provider to create a treatment plan that works for you. Where to find more information American Behcet's Disease Association: behcets.com Contact a health care provider if: You have a fever. You have a flare-up. You have new symptoms. You vomit blood or have blood in your stool. Get help right away if: You have chest pain or trouble breathing. You have a stiff neck. You have any symptoms of a stroke. "BE FAST" is an easy way to remember the main warning signs of a stroke: B - Balance. Signs are dizziness, sudden trouble walking, or loss of balance. E - Eyes. Signs are trouble seeing or a sudden change in vision. F - Face. Signs are sudden weakness or numbness of the face, or the face or eyelid drooping on one side. A - Arms. Signs are weakness or numbness in an arm. This happens suddenly and usually on one side of the body. S - Speech. Signs are sudden trouble speaking, slurred speech, or trouble understanding what people say. T - Time. Time to call emergency services. Write down what time symptoms started. You have other signs of a stroke, such as: A sudden, severe headache with no known cause. Nausea or vomiting. Seizure. These symptoms may be an emergency. Get help right away. Call 911. Do not wait to see if the symptoms will go away. Do not drive yourself to the hospital. This information is not intended to replace advice given to you by your health care provider. Make sure you discuss any questions you have with your health care provider. Document Revised: 06/03/2022 Document Reviewed:  06/03/2022 Elsevier Patient Education  2024 [employer].

## 2023-10-29 NOTE — Progress Notes (Signed)
[facility]    Provider:  Dr Lucia Gaskins Requesting Provider: Manning Charity, OD Primary Care Provider:  Adrian Prince, MD  CC:  Headaches/stabbing/new  HPI:  Jane Gutierrez is a 66 y.o. female here as requested by Manning Charity, OD for diplopia rule out myasthenia gravis. has IBS (irritable bowel syndrome); Lyme disease; Asthma; Hypothyroidism; Contact lens/glasses fitting; SIRS (systemic inflammatory response syndrome) (HCC); Anemia; Dehydration; Fever; Other chronic pulmonary heart diseases; Pre-operative cardiovascular examination; Acquired absence of breast and absent nipple, bilateral; Dyspnea on exertion; Heart palpitations; Right hip pain; Right knee pain; Breast cancer of upper-outer quadrant of left female breast (HCC); Dyspnea; Abnormal weight loss; Alopecia; Benign neoplasm of colon; Chronic sinusitis; Cough; Family history of diseases of the blood and blood-forming organs and certain disorders involving the immune mechanism; Fatigue; Headache; Hyperlipidemia; Joint pain; Localized swelling, mass and lump, trunk; Malignant neoplasm of cervix uteri (HCC); Migraine without aura, not refractory; Myopathy; Nontoxic single thyroid nodule; Osteoarthritis; Pain in left elbow; Pain in right hand; Personal history of other specified conditions; Polyneuropathy; Psychophysiologic insomnia; Rash and other nonspecific skin eruption; Raynaud's disease; Thrombophlebitis of superficial veins of lower extremity; Varicose veins of other specified sites; Wheezing; Hardening of the aorta (main artery of the heart) (HCC); Genital herpes simplex; Encounter for counseling; Breast pain; Gastroesophageal reflux disease; Constipation; Basal cell carcinoma; Corneal ulcer of left eye; and Status post bilateral breast reconstruction on their problem list.  Per Dr. Dellia Nims notes, patient is in pain OS, reported gritty, mucus, eye changes, double vision noticed Saturday night during a play downtown, images 1  on top of the other, has not been able to use contacts, diagnosed with intermittent vertical diplopia and referred to [place] to rule out myasthenia gravis, thyroid disorder, vascular etiology versus myogenic decompensation, difficult to read handwritten notes.  Since last November she has been having feelings of mucous in the left eye, they treated it with antobiotics first and then steroids then got a corneal ulcer then mucous production with grittiness, they thought inflammatory, mucous got better but still has crusty eye. In the past she had ENT encapsulated cyst removed behind the left eye. In April she had vasculitis, she saw Dr. Nickola Major at [facility] rheumatology and all labwork was negative. Last December coffee started tasting bad and in the last month or few weeks she has tingling in the lips, tingling in the left cheek, burning in the right thigh. She still has crust left eye in the morning, she has been using ocuvite wipes on the recommendation of Dr, Ulice Bold, she has FHx Artemio Aly, MD, sarcoidosis, other autoimmune disorders. The lid is droopier on the left, worse first thing in the morning and better as the day goes by and gets wider open during the day, she has tearing in the left eye but again getting better, no head pain associated (she had one ice pick headache 4 months ago ongoing since a child but not associated, no cluster headache symptoms for example), Her lid is almost all the way closed in the morning (showed me a pictures and the lid if below the pupil line and gets better as the day goes on and ongoing even sine last December. Does not get worse with fatigue, she has had double vision twice once in the middle of September and in December lasted 5 minutes and she blinked and it resolved. The droopy lid is getting worse. No swallowing problems, no new cough or new SOB (has had it since chemo for breast cancer),  no swallowing problems, no dysphagia, nothing going down the wrong way, no cjking  when eating or driking, no weakness of head ext flexion, she can get up off the floor without her hands, she plays pickle ball, 10-12k steps a day. She has ulcers in her mouth all the time. Her left eye vision had eteriorated a little worse. She can;t see the ball on the left in the left eye peripherally to the left.   Patient complains of symptoms per HPI as well as the following symptoms: eye pain . Pertinent negatives and positives per HPI. All others negative  Reviewed images and labs:   EXAM: CT HEAD WITHOUT CONTRAST   TECHNIQUE: Contiguous axial images were obtained from the base of the skull through the vertex without intravenous contrast.   RADIATION DOSE REDUCTION: This exam was performed according to the departmental dose-optimization program which includes automated exposure control, adjustment of the mA and/or kV according to patient size and/or use of iterative reconstruction technique.   COMPARISON:  None.   FINDINGS: Brain: No acute intracranial findings are seen in noncontrast CT brain. There are no signs of bleeding within the cranium. Cortical sulci are prominent.   Vascular: Unremarkable.   Skull: Unremarkable.   Sinuses/Orbits: Unremarkable.   Other: None   IMPRESSION: No acute intracranial findings are seen in noncontrast CT brain. Atrophy.     Electronically Signed   By: Ernie Avena M.D.   On: 02/25/2022 12:44     Latest Ref Rng & Units 10/29/2023    9:44 AM 11/08/2022    8:23 AM 08/06/2022    7:36 AM  CMP  Glucose 70 - 99 mg/dL 90     BUN 8 - 27 mg/dL 11     Creatinine 1.61 - 1.00 mg/dL 0.96     Sodium 045 - 409 mmol/L 139     Potassium 3.5 - 5.2 mmol/L 4.4     Chloride 96 - 106 mmol/L 100     CO2 20 - 29 mmol/L 27     Calcium 8.7 - 10.3 mg/dL 9.4     Total Protein 6.0 - 8.5 g/dL 7.2  6.4  6.9   Total Bilirubin 0.0 - 1.2 mg/dL 0.4  0.4  0.4   Alkaline Phos 44 - 121 IU/L 76  56  57   AST 0 - 40 IU/L 35  25  30   ALT 0 - 32 IU/L  26  19  22        Latest Ref Rng & Units 10/29/2023    9:44 AM 11/28/2014    8:12 AM 09/01/2014    7:07 AM  CBC  WBC 3.4 - 10.8 x10E3/uL 5.1  6.0    Hemoglobin 11.1 - 15.9 g/dL 81.1  91.4  78.2   Hematocrit 34.0 - 46.6 % 46.9  44.3    Platelets 150 - 450 x10E3/uL 279  211      Recent Results (from the past 2160 hour(s))  Sedimentation rate     Status: None   Collection Time: 10/29/23  9:44 AM  Result Value Ref Range   Sed Rate 3 0 - 40 mm/hr  C-reactive protein     Status: None   Collection Time: 10/29/23  9:44 AM  Result Value Ref Range   CRP 2 0 - 10 mg/L  Thyroglobulin antibody     Status: None   Collection Time: 10/29/23  9:44 AM  Result Value Ref Range   Thyroglobulin Antibody <1.0 0.0 - 0.9 IU/mL  Comment: Thyroglobulin Antibody measured by [employer] Methodology It should be noted that the presence of thyroglobulin antibodies may not be pathogenic nor diagnostic, especially at very low levels. The assay manufacturer has found that four percent of individuals without evidence of thyroid disease or autoimmunity will have positive TgAb levels up to 4 IU/mL.   Thyroid peroxidase antibody     Status: None   Collection Time: 10/29/23  9:44 AM  Result Value Ref Range   Thyroperoxidase Ab SerPl-aCnc <9 0 - 34 IU/mL  Factor V Leiden     Status: None (Preliminary result)   Collection Time: 10/29/23  9:44 AM  Result Value Ref Range   Factor V Leiden Result WILL FOLLOW    Interpretation: WILL FOLLOW    Methodology: WILL FOLLOW    Comments: WILL FOLLOW   Myasthenia Gravis Profile     Status: None (Preliminary result)   Collection Time: 10/29/23  9:44 AM  Result Value Ref Range   AChR Binding Ab, Serum <0.03 0.00 - 0.24 nmol/L    Comment:                                Negative:   0.00 - 0.24                                Borderline: 0.25 - 0.40                                Positive:         >0.40    Acetylchol Block Ab 17 0 - 25 %    Comment:                                 Negative:      0 - 25                                Borderline:   26 - 30                                Positive:         >30    AChR-modulating Ab WILL FOLLOW    Anti-striation Abs Negative Neg:<1:100   Reflex Information WILL FOLLOW   MuSK Antibodies     Status: None (Preliminary result)   Collection Time: 10/29/23  9:44 AM  Result Value Ref Range   MuSK Antibodies WILL FOLLOW   CBC with Differential/Platelets     Status: Abnormal   Collection Time: 10/29/23  9:44 AM  Result Value Ref Range   WBC 5.1 3.4 - 10.8 x10E3/uL   RBC 5.17 3.77 - 5.28 x10E6/uL   Hemoglobin 14.9 11.1 - 15.9 g/dL   Hematocrit 40.9 (H) 81.1 - 46.6 %   MCV 91 79 - 97 fL   MCH 28.8 26.6 - 33.0 pg   MCHC 31.8 31.5 - 35.7 g/dL   RDW 91.4 78.2 - 95.6 %   Platelets 279 150 - 450 x10E3/uL   Neutrophils 43 Not Estab. %   Lymphs 46 Not Estab. %   Monocytes 8 Not Estab. %   Eos  2 Not Estab. %   Basos 1 Not Estab. %   Neutrophils Absolute 2.2 1.4 - 7.0 x10E3/uL   Lymphocytes Absolute 2.3 0.7 - 3.1 x10E3/uL   Monocytes Absolute 0.4 0.1 - 0.9 x10E3/uL   EOS (ABSOLUTE) 0.1 0.0 - 0.4 x10E3/uL   Basophils Absolute 0.1 0.0 - 0.2 x10E3/uL   Immature Granulocytes 0 Not Estab. %   Immature Grans (Abs) 0.0 0.0 - 0.1 x10E3/uL  Comprehensive metabolic panel     Status: None   Collection Time: 10/29/23  9:44 AM  Result Value Ref Range   Glucose 90 70 - 99 mg/dL   BUN 11 8 - 27 mg/dL   Creatinine, Ser 1.61 0.57 - 1.00 mg/dL   eGFR 98 >09 UE/AVW/0.98   BUN/Creatinine Ratio 18 12 - 28   Sodium 139 134 - 144 mmol/L   Potassium 4.4 3.5 - 5.2 mmol/L   Chloride 100 96 - 106 mmol/L   CO2 27 20 - 29 mmol/L   Calcium 9.4 8.7 - 10.3 mg/dL   Total Protein 7.2 6.0 - 8.5 g/dL   Albumin 4.6 3.9 - 4.9 g/dL   Globulin, Total 2.6 1.5 - 4.5 g/dL   Bilirubin Total 0.4 0.0 - 1.2 mg/dL   Alkaline Phosphatase 76 44 - 121 IU/L   AST 35 0 - 40 IU/L   ALT 26 0 - 32 IU/L  Vitamin A     Status: None (Preliminary result)    Collection Time: 10/29/23  9:44 AM  Result Value Ref Range   Vitamin A WILL FOLLOW   B12 and Folate Panel     Status: None   Collection Time: 10/29/23  9:44 AM  Result Value Ref Range   Vitamin B-12 1,045 232 - 1,245 pg/mL   Folate >20.0 >3.0 ng/mL    Comment: A serum folate concentration of less than 3.1 ng/mL is considered to represent clinical deficiency.   Methylmalonic acid, serum     Status: None (Preliminary result)   Collection Time: 10/29/23  9:44 AM  Result Value Ref Range   Methylmalonic Acid WILL FOLLOW   Vitamin B1     Status: None (Preliminary result)   Collection Time: 10/29/23  9:44 AM  Result Value Ref Range   Thiamine WILL FOLLOW   Vitamin E     Status: None (Preliminary result)   Collection Time: 10/29/23  9:44 AM  Result Value Ref Range   Vitamin E (Alpha Tocopherol) WILL FOLLOW    Vitamin E(Gamma Tocopherol) WILL FOLLOW   TSH Rfx on Abnormal to Free T4     Status: Abnormal   Collection Time: 10/29/23  9:50 AM  Result Value Ref Range   TSH 0.227 (L) 0.450 - 4.500 uIU/mL  T4F     Status: None   Collection Time: 10/29/23  9:50 AM  Result Value Ref Range   T4,Free (Direct) 1.53 0.82 - 1.77 ng/dL     HPI 12/23/9145:  Jane Gutierrez is a 67 y.o. female here as requested by Manning Charity, OD for stabbing headaches. PMHx raynauds, migraines, icepick headaches as a child (these are different). She is here alone and reports when she was in 6th grade she was diagnosed with ice-pick headaches always in her right temple. In 1987 she had a headache and she has a benign frontal lobe cyst that was removed. 345 Monday morning she woke up and she was having stabbing headaches, quick, in the back of the head, not like her previous headaches completely different, she called  Dr. Evlyn Kanner and got a toradol shot, she had it for 16 hours, she slept Tuesday morning had them again, yesterday she feltlike she was hit with a mack truck, head was throbbing, last night the jabbing started again,  20 of them, can occur 100s of times a day, no autonomic symptoms. Today she had them for 6 hours her head it is wearing her out. Headaches are positional and exertional with vision changes we need further imaging. No other focal neurologic deficits, associated symptoms, inciting events or modifiable factors.  Reviewed notes, labs and imaging from outside physicians, which showed  CT head (personally reviewed images and agree): FINDINGS: Brain: No acute intracranial findings are seen in noncontrast CT brain. There are no signs of bleeding within the cranium. Cortical sulci are prominent.   Vascular: Unremarkable.   Skull: Unremarkable.   Sinuses/Orbits: Unremarkable.   Other: None   IMPRESSION: No acute intracranial findings are seen in noncontrast CT brain. Atrophy.  In 2018 had rehaumatologic labs: ana mildly positive 1:80 unlikely clinically significant, rf and cc neg, esr/crp nml  Review of Systems: Patient complains of symptoms per HPI as well as the following symptoms stabbing headace. Pertinent negatives and positives per HPI. All others negative.   Social History   Socioeconomic History   Marital status: Married    Spouse name: Not on file   Number of children: Not on file   Years of education: Not on file   Highest education level: Not on file  Occupational History   Not on file  Tobacco Use   Smoking status: Never   Smokeless tobacco: Never  Substance and Sexual Activity   Alcohol use: No    Alcohol/week: 0.0 standard drinks of alcohol    Comment: occasionally   Drug use: No   Sexual activity: Yes  Other Topics Concern   Not on file  Social History Narrative   Not on file   Social Determinants of Health   Financial Resource Strain: Not on file  Food Insecurity: Not on file  Transportation Needs: Not on file  Physical Activity: Not on file  Stress: Not on file  Social Connections: Unknown (05/07/2022)   Received from [facility]    Social Network    Social Network: Not on file  Intimate Partner Violence: Unknown (03/29/2022)   [facility] from [facility]   HITS    Physically Hurt: Not on file    Insult or Talk Down To: Not on file    Threaten Physical Harm: Not on file    Scream or Curse: Not on file    Family History  Problem Relation Age of Onset   Cancer Mother 73       uterine, mult myeloma   CVA Father    Cancer Sister 17       breast   Leukemia Sister        CLL   Lupus Sister        twin sister   Sarcoidosis Sister        twin sister   Rheum arthritis Sister        twin sister   Cancer Maternal Grandmother 60       breast   Celiac disease Daughter    Migraines Daughter     Past Medical History:  Diagnosis Date   Abnormally small mouth    ADD (attention deficit disorder)    ADHD    Allergy    Anxiety  pt reported she doesn't have anxiety   Asthma    prn inhaler   Blood transfusion without reported diagnosis    Breast cancer (HCC)    Dental crowns present    Difficulty swallowing pills    History of breast cancer    History of cervical cancer    History of palpitations    last echo 08/22/2014   Hypothyroidism    IBS (irritable bowel syndrome)    Vasculitis (HCC)    Dr. Terri Piedra last year dx    Patient Active Problem List   Diagnosis Date Noted   Corneal ulcer of left eye 07/08/2023   Gastroesophageal reflux disease 01/15/2023   Constipation 01/15/2023   Breast pain 05/28/2022   Encounter for counseling 10/16/2021   Hardening of the aorta (main artery of the heart) (HCC) 03/02/2021   Genital herpes simplex 02/22/2021   Family history of diseases of the blood and blood-forming organs and certain disorders involving the immune mechanism 10/21/2019   Polyneuropathy 10/21/2019   Rash and other nonspecific skin eruption 10/21/2019   Raynaud's disease 01/12/2019   Osteoarthritis 11/12/2018   Cough 10/27/2017   Pain in right hand 09/02/2017   Chronic sinusitis  08/18/2017   Wheezing 08/18/2017   Psychophysiologic insomnia 01/06/2017   Pain in left elbow 01/02/2016   Dyspnea 09/11/2015   Abnormal weight loss 08/17/2015   Alopecia 08/17/2015   Fatigue 08/17/2015   Joint pain 08/17/2015   Breast cancer of upper-outer quadrant of left female breast (HCC) 06/28/2015   Nontoxic single thyroid nodule 12/28/2014   Right hip pain 09/19/2014   Right knee pain 09/19/2014   Dyspnea on exertion 08/08/2014   Heart palpitations 08/08/2014   Basal cell carcinoma 06/14/2014   Status post bilateral breast reconstruction 03/16/2014   Acquired absence of breast and absent nipple, bilateral 03/09/2014   Myopathy 02/01/2014   Localized swelling, mass and lump, trunk 11/22/2013   Pre-operative cardiovascular examination 09/09/2013   Other chronic pulmonary heart diseases 05/27/2013   SIRS (systemic inflammatory response syndrome) (HCC) 04/14/2013   Anemia 04/14/2013   Dehydration 04/14/2013   Fever 04/14/2013   IBS (irritable bowel syndrome)    Lyme disease    Asthma    Hypothyroidism    Contact lens/glasses fitting    Hyperlipidemia 11/16/2012   Headache 02/05/2012   Benign neoplasm of colon 09/17/2011   Migraine without aura, not refractory 09/17/2011   Malignant neoplasm of cervix uteri (HCC) 09/06/2009   Personal history of other specified conditions 09/06/2009   Thrombophlebitis of superficial veins of lower extremity 09/06/2009   Varicose veins of other specified sites 09/06/2009    Past Surgical History:  Procedure Laterality Date   ABDOMINAL HYSTERECTOMY     ABDOMINOPLASTY  03/28/2004   AXILLARY SENTINEL NODE BIOPSY  12/31/2012   Procedure: AXILLARY SENTINEL NODE BIOPSY;  Surgeon: Emelia Loron, MD;  Location: [facility];  Service: General;  Laterality: Bilateral;  bilateral sentinel node   BRAIN SURGERY     removal benign frontal lobe cyst in 1987   BREAST ENHANCEMENT SURGERY Bilateral ~ 2008   BREAST IMPLANT EXCHANGE  Bilateral 12/11/2017   Procedure: BILATERAL IMPLANT EXCHANGE;  Surgeon: Peggye Form, DO;  Location: [facility];  Service: Plastics;  Laterality: Bilateral;   BREAST RECONSTRUCTION WITH PLACEMENT OF TISSUE EXPANDER AND FLEX HD (ACELLULAR HYDRATED DERMIS)  12/31/2012   Procedure: BREAST RECONSTRUCTION WITH PLACEMENT OF TISSUE EXPANDER AND FLEX HD (ACELLULAR HYDRATED DERMIS);  Surgeon: Wayland Denis, DO;  Location:  [facility];  Service: Plastics;  Laterality: Bilateral;  bilateral immediate breast reconstruction with expanders and flex hd    CAPSULOTOMY Left 09/01/2014   Procedure: CAPSULOTOMY OF LEFT BREAST WITH LIPOFILLING FOR SYMMETRY;  Surgeon: Wayland Denis, DO;  Location: [facility];  Service: Plastics;  Laterality: Left;   CERVICAL CERCLAGE     DIAGNOSTIC LAPAROSCOPY  05/12/2003   right partial salpingectomy; lysis paraovarian adhesions left   DILATION AND CURETTAGE OF UTERUS  2005   ENDOMETRIAL FULGURATION  05/12/2003   FACIAL COSMETIC SURGERY  ~ 2010   FOOT SURGERY Bilateral 07/2012   GYNECOLOGIC CRYOSURGERY  1998   INCISION AND DRAINAGE OF WOUND Left 04/28/2013   Procedure: IRRIGATION AND DEBRIDEMENT LEFT BREAST WOUND, POSSIBLE CLOSURE OF WOUND WITH DRAIN PLACEMENT;  Surgeon: Wayland Denis, DO;  Location: [facility];  Service: Plastics;  Laterality: Left;   LATISSIMUS FLAP TO BREAST Left 09/29/2013   Procedure: LATISSIMUS FLAP TO BREAST WITH TISSUE EXPANDER PLACEMENT FOR LEFT BREAST RECONSTRUCTION;  Surgeon: Wayland Denis, DO;  Location: [facility];  Service: Plastics;  Laterality: Left;   LIPECTOMY Bilateral 03/28/2004   hip   LIPOSUCTION Bilateral 03/09/2014   Procedure: LIPOSUCTION ABDOMEN W/ LIPOFILLING LATERAL BILATERAL BREAST;  Surgeon: Wayland Denis, DO;  Location: [facility];  Service: Plastics;  Laterality: Bilateral;  LIPOSUCTION NECK   LIPOSUCTION WITH LIPOFILLING Bilateral 09/01/2014   Procedure: LIPOSUCTION  WITH LIPOFILLING;  Surgeon: Wayland Denis, DO;  Location: [facility];  Service: Plastics;  Laterality: Bilateral;   LYSIS OF ADHESION  03/28/2004   pelvic   MASTECTOMY     PORT-A-CATH REMOVAL Right 03/09/2014   Procedure: REMOVAL PORT-A-CATH;  Surgeon: Wayland Denis, DO;  Location: [facility];  Service: Plastics;  Laterality: Right;   PORTACATH PLACEMENT  01/27/2013   Procedure: INSERTION PORT-A-CATH;  Surgeon: Emelia Loron, MD;  Location: [facility];  Service: General;  Laterality: Right;   RADIOFREQUENCY ABLATION NERVES  05/12/2003   uterosacral nerve ablation   REMOVAL OF BILATERAL TISSUE EXPANDERS WITH PLACEMENT OF BILATERAL BREAST IMPLANTS Bilateral 03/09/2014   Procedure: REMOVAL OF BILATERAL TISSUE EXPANDERS WITH PLACEMENT OF BILATERAL BREAST IMPLANTS;  Surgeon: Wayland Denis, DO;  Location: [facility];  Service: Plastics;  Laterality: Bilateral;   TEE WITHOUT CARDIOVERSION N/A 06/04/2013   Procedure: TRANSESOPHAGEAL ECHOCARDIOGRAM (TEE);  Surgeon: Dolores Patty, MD;  Location: [facility];  Service: Cardiovascular;  Laterality: N/A;   TISSUE EXPANDER PLACEMENT Left 06/16/2013   Procedure: LEFT BREAST TISSUE EXPANDER REMOVAL;  Surgeon: Wayland Denis, DO;  Location: [facility];  Service: Plastics;  Laterality: Left;   TISSUE EXPANDER PLACEMENT Left 09/29/2013   Procedure: TISSUE EXPANDER;  Surgeon: Wayland Denis, DO;  Location: [facility];  Service: Plastics;  Laterality: Left;   TOTAL ABDOMINAL HYSTERECTOMY W/ BILATERAL SALPINGOOPHORECTOMY  03/28/2004   TOTAL MASTECTOMY  12/31/2012   Procedure: TOTAL MASTECTOMY;  Surgeon: Emelia Loron, MD;  Location: [facility];  Service: General;  Laterality: Bilateral;  bilateral total mastectomies    Current Outpatient Medications  Medication Sig Dispense Refill   acyclovir ointment (ZOVIRAX) 5 % Apply 1 application topically to the affected area(s) every 3  hours, 6 times a day. 30 g 2   albuterol (VENTOLIN HFA) 108 (90 Base) MCG/ACT inhaler Inhale 2 puffs into the lungs every 6 (six) hours as directed 6.7 g 11   calcium carbonate (OS-CAL - DOSED IN MG OF ELEMENTAL CALCIUM) 1250 (500 Ca)  MG tablet Take 1 tablet by mouth daily.     Cholecalciferol (VITAMIN D) 125 MCG (5000 UT) CAPS Take 1 capsule by mouth daily at 6 (six) AM.     COVID-19 mRNA bivalent vaccine, Pfizer, (PFIZER COVID-19 VAC BIVALENT) injection Inject into the muscle. 0.3 mL 0   cyanocobalamin (VITAMIN B12) 500 MCG tablet Take 500 mcg by mouth daily. Take one pill daily     dextroamphetamine (DEXTROSTAT) 10 MG tablet Take 2 tablets (20 mg total) by mouth 2 (two) times daily. 360 tablet 0   diphenhydramine-acetaminophen (TYLENOL PM) 25-500 MG TABS tablet Take 1 tablet by mouth at bedtime as needed.     EPINEPHrine (EPIPEN 2-PAK) 0.3 mg/0.3 mL IJ SOAJ injection Use as directed as needed. 2 each 1   famotidine (PEPCID) 20 MG tablet Take 20 mg by mouth daily.     fluconazole (DIFLUCAN) 150 MG tablet Take 1 tablet (150 mg total) by mouth 2 (two) times a week. 2 tablet 3   hyoscyamine (OSCIMIN) 0.125 MG SL tablet Take 1 tablet (0.125 mg total) under tongue 2- 3 times daily as needed. 120 tablet 3   Lifitegrast (XIIDRA) 5 % SOLN Place 1 drop into both eyes 2 (two) times daily. 180 each 4   loratadine (CLARITIN) 10 MG tablet Take 1 tab po QD     Multiple Vitamin (MULTIVITAMIN WITH MINERALS) TABS Take 1 tablet by mouth daily.     phenazopyridine (PYRIDIUM) 200 MG tablet Take 1 tablet (200 mg total) by mouth 3 (three) times daily for 2 days 6 tablet 2   rifaximin (XIFAXAN) 550 MG TABS tablet Take 1 tablet (550 mg total) by mouth 3 (three) times daily for 14 days 42 tablet 3   rosuvastatin (CRESTOR) 5 MG tablet Take 1 tablet (5 mg total) by mouth 2 (two) times a week. 30 tablet 5   SHINGRIX injection      SYNTHROID 100 MCG tablet Take 1 tablet (100 mcg total) by mouth in the morning. 90 tablet 3    valACYclovir (VALTREX) 500 MG tablet Take 1 tablet (500 mg total) by mouth every 12 (twelve) hours for 3 days. 90 tablet 0   vitamin E 600 UNIT capsule Take 800 Units by mouth daily.      erythromycin ophthalmic ointment Apply a small amount into left eye every night at bedtime 3.5 g 0   fluorometholone (FML) 0.1 % ophthalmic suspension Instill 1 drop into both eyes 4 times day for 2 weeks then 1 drop 2 times day for 2 weeks 10 mL 0   No current facility-administered medications for this visit.    Allergies as of 10/29/2023 - Review Complete 10/29/2023  Allergen Reaction Noted   Betadine [povidone iodine] Anaphylaxis 12/25/2012   Doxycycline Rash 06/19/2018   Iodine Anaphylaxis 03/04/2008   Shellfish-derived products Anaphylaxis 12/14/2012   Gluten meal  07/08/2023   Omeprazole  07/08/2023   Povidone-iodine Other (See Comments) 12/02/2022   Rosuvastatin calcium Other (See Comments) 08/08/2022   Amoxicillin Rash 05/07/2021   Ginger Rash 08/25/2014   Penicillins Rash 03/04/2008   Sulfamethoxazole-trimethoprim Rash 03/04/2008    Vitals: BP (!) 151/91   Pulse 73   Ht 5' 4.5" (1.638 m)   Wt 144 lb (65.3 kg)   BMI 24.34 kg/m  Last Weight:  Wt Readings from Last 1 Encounters:  10/29/23 144 lb (65.3 kg)   Last Height:   Ht Readings from Last 1 Encounters:  10/29/23 5' 4.5" (1.638 m)  Physical exam: Exam: Gen: NAD, conversant, well nourised, obese, well groomed                     CV: RRR, no MRG. No Carotid Bruits. No peripheral edema, warm, nontender Eyes: Conjunctivae clear without exudates or hemorrhage  Neuro: Detailed Neurologic Exam  Speech:    Speech is normal; fluent and spontaneous with normal comprehension.  Cognition:    The patient is oriented to person, place, and time;     recent and remote memory intact;     language fluent;     normal attention, concentration,     fund of knowledge Cranial Nerves:    The pupils are equal, round, and reactive to  light. Attempted could not visualize fundi. Reduced vision left eye upper outer quadrant , otherwise Visual fields are full to finger confrontation. Extraocular movements are intact. Trigeminal sensation is intact and the muscles of mastication are normal. Left lid ptosis, otherwise The face is symmetric. The palate elevates in the midline. Hearing intact. Voice is normal. Shoulder shrug is normal. The tongue has normal motion without fasciculations.   Coordination:    Normal finger to nose and heel to shin. Normal rapid alternating movements.   Gait: nml  Motor Observation:    No asymmetry, no atrophy, and no involuntary movements noted. Tone:    Normal muscle tone.    Posture:    Posture is normal. normal erect    Strength:    Strength is V/V in the upper and lower limbs.      Sensation: intact to LT     Reflex Exam:  DTR's:    Deep tendon reflexes in the upper and lower extremities are normal bilaterally.   Toes:    The toes are downgoing bilaterally.   Clonus:    Clonus is absent.     Assessment/Plan:  67 y.o. female here as requested by Manning Charity, OD for diplopia rule out myasthenia gravis. has IBS (irritable bowel syndrome); Lyme disease; Asthma; Hypothyroidism; Contact lens/glasses fitting; SIRS (systemic inflammatory response syndrome) (HCC); Anemia; Dehydration; Fever; Other chronic pulmonary heart diseases; Pre-operative cardiovascular examination; Acquired absence of breast and absent nipple, bilateral; Dyspnea on exertion; Heart palpitations; Right hip pain; Right knee pain; Breast cancer of upper-outer quadrant of left female breast (HCC); Dyspnea; Abnormal weight loss; Alopecia; Benign neoplasm of colon; Chronic sinusitis; Cough; Family history of diseases of the blood and blood-forming organs and certain disorders involving the immune mechanism; Fatigue; Headache; Hyperlipidemia; Joint pain; Localized swelling, mass and lump, trunk; Malignant neoplasm of cervix uteri  (HCC); Migraine without aura, not refractory; Myopathy; Nontoxic single thyroid nodule; Osteoarthritis; Pain in left elbow; Pain in right hand; Personal history of other specified conditions; Polyneuropathy; Psychophysiologic insomnia; Rash and other nonspecific skin eruption; Raynaud's disease; Thrombophlebitis of superficial veins of lower extremity; Varicose veins of other specified sites; Wheezing; Hardening of the aorta (main artery of the heart) (HCC); Genital herpes simplex; Encounter for counseling; Breast pain; Gastroesophageal reflux disease; Constipation; Basal cell carcinoma; Corneal ulcer of left eye; and Status post bilateral breast reconstruction on their problem list.  This is a very nice patient with recent drainage, irritation, redness of the left eye with a lowered left lid whose been treated for eye infections with antibiotics, steroids, without relief.  Today her left eye appears to have drainage, slight redness, left eyelid droop, pupils are round and reactive to light and equal, but her exam shows reduced vision in the left upper outer  quadrant.  She also has a history of mouth and genital painful sore, inflamed eye, recent rashes was a biopsy consistent with vasculitic process, joint swelling, given constellation and autoimmune history and wondering if this could be Behcet's disease which is a chronic inflammatory disorder that affects multiple parts of the body including the blood vessel eyes skin joints and GI tract.  Vein inflammation can lead to clots and blockages, the brain can also be affected leading to headache which she has had in the past, exam also shows a focal deficit in vision.  Needs a thorough evaluation.  MRI brain and orbits w/wo contrast: Best way to look at the brain and behind the eyes, MRV for thrombosis CTA of the head and neck - best way to look at blood vessels for vasculitic changes Blood work: check for myasthenia gravis (negative cold test in the office but  patient does say she gets better with cool compress) Ct Chest w contrast - for thymoma Ophthamology referral : Dr. Dione Booze, evaluation for left monocular vision loss and eye inflammation (behcet's?) If negative above, will see if we can get a biopsy for Behcet's (buccal biopsy?)  Orders Placed This Encounter  Procedures   MR BRAIN W WO CONTRAST   MR ORBITS W WO CONTRAST   MR MRV HEAD WO CM   CT ANGIO HEAD W OR WO CONTRAST   CT ANGIO NECK W OR WO CONTRAST   Sedimentation rate   C-reactive protein   TSH Rfx on Abnormal to Free T4   Thyroglobulin antibody   Thyroid peroxidase antibody   Factor V Leiden   Myasthenia Gravis Profile   MuSK Antibodies   CBC with Differential/Platelets   Comprehensive metabolic panel   Vitamin A   B12 and Folate Panel   Methylmalonic acid, serum   Vitamin B1   Vitamin E   T4F   Ambulatory referral to Ophthalmology      Cc: Manning Charity, Sherald Barge,  Adrian Prince, MD  Naomie Dean, MD  [facility] [address] [place], [place] [address]  Phone [phone] Fax [phone]  I spent over 70 minutes of face-to-face and non-face-to-face time with patient on the  1. Inflammatory eye disease   2. Vision loss of left eye   3. Rash   4. Vasculitis determined by biopsy of skin   5. Diplopia   6. Ptosis, left eyelid   7. Mouth ulcer   8. Genital ulcer, female   70. Hx of blood clots   10. Intracranial and intraspinal phlebitis and thrombophlebitis   11. 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  This included previsit chart review, lab review, study review, order entry, electronic health record documentation, patient education on the different diagnostic and therapeutic options, counseling and coordination of care, risks and benefits of management, compliance, or risk factor reduction

## 2023-10-30 ENCOUNTER — Telehealth: Payer: Self-pay | Admitting: Neurology

## 2023-10-30 LAB — TSH RFX ON ABNORMAL TO FREE T4: TSH: 0.227 u[IU]/mL — ABNORMAL LOW (ref 0.450–4.500)

## 2023-10-30 LAB — T4F: T4,Free (Direct): 1.53 ng/dL (ref 0.82–1.77)

## 2023-10-30 NOTE — Telephone Encounter (Signed)
Urgent referral sent to [facility], phone # [phone]. 

## 2023-10-31 ENCOUNTER — Other Ambulatory Visit (HOSPITAL_BASED_OUTPATIENT_CLINIC_OR_DEPARTMENT_OTHER): Payer: Self-pay

## 2023-10-31 MED ORDER — FLUOROMETHOLONE 0.1 % OP SUSP
OPHTHALMIC | 0 refills | Status: DC
  Filled 2023-10-31: qty 10, 28d supply, fill #0
  Filled 2023-12-08: qty 10, 30d supply, fill #0

## 2023-10-31 MED ORDER — ERYTHROMYCIN 5 MG/GM OP OINT
1.0000 | TOPICAL_OINTMENT | Freq: Every day | OPHTHALMIC | 0 refills | Status: DC
  Filled 2023-10-31: qty 3.5, 7d supply, fill #0

## 2023-11-01 ENCOUNTER — Other Ambulatory Visit (HOSPITAL_BASED_OUTPATIENT_CLINIC_OR_DEPARTMENT_OTHER): Payer: Self-pay

## 2023-11-03 ENCOUNTER — Other Ambulatory Visit: Payer: Self-pay

## 2023-11-04 ENCOUNTER — Telehealth: Payer: Self-pay | Admitting: Neurology

## 2023-11-04 NOTE — Telephone Encounter (Signed)
MRI/CT orders sent to [facility], they obtain [employer]. [phone]

## 2023-11-04 NOTE — Telephone Encounter (Signed)
Referral for Ophthalmology sent to [facility]. Phone: [phone] Fax: [phone]

## 2023-11-05 ENCOUNTER — Other Ambulatory Visit (HOSPITAL_COMMUNITY): Payer: Self-pay

## 2023-11-05 MED ORDER — LOSARTAN POTASSIUM 50 MG PO TABS
50.0000 mg | ORAL_TABLET | Freq: Every day | ORAL | 3 refills | Status: DC
  Filled 2023-11-05: qty 90, 90d supply, fill #0
  Filled 2023-12-08: qty 90, 90d supply, fill #1

## 2023-11-06 ENCOUNTER — Other Ambulatory Visit (HOSPITAL_BASED_OUTPATIENT_CLINIC_OR_DEPARTMENT_OTHER): Payer: Self-pay

## 2023-11-06 NOTE — Telephone Encounter (Signed)
The MRIs are scheduled at [facility], but they told me to send the CT scans to the hospital since she has a contrast allergy. [employer] Berkley Harvey: [id number] exp. 11/06/23-05/04/24 sent to [facility] [phone]

## 2023-11-06 NOTE — Telephone Encounter (Signed)
Attempted to call and 4 min wait. Will check back later.

## 2023-11-06 NOTE — Telephone Encounter (Signed)
Jane Gutierrez with radiology scheduling said that the patient needs contrast allergy prep for the CT scans

## 2023-11-10 ENCOUNTER — Encounter: Payer: Self-pay | Admitting: Neurology

## 2023-11-10 ENCOUNTER — Other Ambulatory Visit: Payer: Self-pay | Admitting: Neurology

## 2023-11-10 ENCOUNTER — Other Ambulatory Visit (HOSPITAL_COMMUNITY): Payer: Self-pay

## 2023-11-10 MED ORDER — PREDNISONE 50 MG PO TABS
ORAL_TABLET | ORAL | 0 refills | Status: DC
  Filled 2023-11-10: qty 3, 1d supply, fill #0

## 2023-11-10 NOTE — Telephone Encounter (Signed)
Dr Lucia Gaskins ordered this.

## 2023-11-10 NOTE — Telephone Encounter (Signed)
Pt called and LVM regarding her CT Scan. Please advise.

## 2023-11-11 ENCOUNTER — Encounter: Payer: Commercial Managed Care - PPO | Admitting: Plastic Surgery

## 2023-11-11 ENCOUNTER — Ambulatory Visit (HOSPITAL_COMMUNITY)
Admission: RE | Admit: 2023-11-11 | Discharge: 2023-11-11 | Disposition: A | Discharge status: Home / Self Care | Payer: Medicare HMO | Source: Ambulatory Visit | Attending: Neurology | Admitting: Neurology

## 2023-11-11 ENCOUNTER — Other Ambulatory Visit: Payer: Self-pay | Admitting: Neurology

## 2023-11-11 DIAGNOSIS — I776 Arteritis, unspecified: Secondary | ICD-10-CM

## 2023-11-11 DIAGNOSIS — H02402 Unspecified ptosis of left eyelid: Secondary | ICD-10-CM | POA: Insufficient documentation

## 2023-11-11 DIAGNOSIS — H547 Unspecified visual loss: Secondary | ICD-10-CM | POA: Diagnosis not present

## 2023-11-11 DIAGNOSIS — Z86718 Personal history of other venous thrombosis and embolism: Secondary | ICD-10-CM | POA: Insufficient documentation

## 2023-11-11 DIAGNOSIS — H5462 Unqualified visual loss, left eye, normal vision right eye: Secondary | ICD-10-CM

## 2023-11-11 DIAGNOSIS — L959 Vasculitis limited to the skin, unspecified: Secondary | ICD-10-CM | POA: Diagnosis not present

## 2023-11-11 DIAGNOSIS — N766 Ulceration of vulva: Secondary | ICD-10-CM | POA: Diagnosis not present

## 2023-11-11 DIAGNOSIS — R21 Rash and other nonspecific skin eruption: Secondary | ICD-10-CM

## 2023-11-11 DIAGNOSIS — G08 Intracranial and intraspinal phlebitis and thrombophlebitis: Secondary | ICD-10-CM

## 2023-11-11 DIAGNOSIS — H5789 Other specified disorders of eye and adnexa: Secondary | ICD-10-CM | POA: Diagnosis not present

## 2023-11-11 DIAGNOSIS — H532 Diplopia: Secondary | ICD-10-CM | POA: Insufficient documentation

## 2023-11-11 DIAGNOSIS — K121 Other forms of stomatitis: Secondary | ICD-10-CM

## 2023-11-11 DIAGNOSIS — I6523 Occlusion and stenosis of bilateral carotid arteries: Secondary | ICD-10-CM | POA: Diagnosis not present

## 2023-11-11 DIAGNOSIS — I700 Atherosclerosis of aorta: Secondary | ICD-10-CM | POA: Diagnosis not present

## 2023-11-11 MED ORDER — IOHEXOL 350 MG/ML SOLN
75.0000 mL | Freq: Once | INTRAVENOUS | Status: AC | PRN
  Administered 2023-11-11: 75 mL via INTRAVENOUS

## 2023-11-13 DIAGNOSIS — H2513 Age-related nuclear cataract, bilateral: Secondary | ICD-10-CM | POA: Diagnosis not present

## 2023-11-13 DIAGNOSIS — H35041 Retinal micro-aneurysms, unspecified, right eye: Secondary | ICD-10-CM | POA: Diagnosis not present

## 2023-11-14 LAB — CBC WITH DIFFERENTIAL/PLATELET
Basophils Absolute: 0.1 10*3/uL (ref 0.0–0.2)
Basos: 1 %
EOS (ABSOLUTE): 0.1 10*3/uL (ref 0.0–0.4)
Eos: 2 %
Hematocrit: 46.9 % — ABNORMAL HIGH (ref 34.0–46.6)
Hemoglobin: 14.9 g/dL (ref 11.1–15.9)
Immature Grans (Abs): 0 10*3/uL (ref 0.0–0.1)
Immature Granulocytes: 0 %
Lymphocytes Absolute: 2.3 10*3/uL (ref 0.7–3.1)
Lymphs: 46 %
MCH: 28.8 pg (ref 26.6–33.0)
MCHC: 31.8 g/dL (ref 31.5–35.7)
MCV: 91 fL (ref 79–97)
Monocytes Absolute: 0.4 10*3/uL (ref 0.1–0.9)
Monocytes: 8 %
Neutrophils Absolute: 2.2 10*3/uL (ref 1.4–7.0)
Neutrophils: 43 %
Platelets: 279 10*3/uL (ref 150–450)
RBC: 5.17 x10E6/uL (ref 3.77–5.28)
RDW: 13.3 % (ref 11.7–15.4)
WBC: 5.1 10*3/uL (ref 3.4–10.8)

## 2023-11-14 LAB — COMPREHENSIVE METABOLIC PANEL
ALT: 26 [IU]/L (ref 0–32)
AST: 35 [IU]/L (ref 0–40)
Albumin: 4.6 g/dL (ref 3.9–4.9)
Alkaline Phosphatase: 76 [IU]/L (ref 44–121)
BUN/Creatinine Ratio: 18 (ref 12–28)
BUN: 11 mg/dL (ref 8–27)
Bilirubin Total: 0.4 mg/dL (ref 0.0–1.2)
CO2: 27 mmol/L (ref 20–29)
Calcium: 9.4 mg/dL (ref 8.7–10.3)
Chloride: 100 mmol/L (ref 96–106)
Creatinine, Ser: 0.61 mg/dL (ref 0.57–1.00)
Globulin, Total: 2.6 g/dL (ref 1.5–4.5)
Glucose: 90 mg/dL (ref 70–99)
Potassium: 4.4 mmol/L (ref 3.5–5.2)
Sodium: 139 mmol/L (ref 134–144)
Total Protein: 7.2 g/dL (ref 6.0–8.5)
eGFR: 98 mL/min/{1.73_m2} (ref >59)

## 2023-11-14 LAB — MYASTHENIA GRAVIS PROFILE
AChR Binding Ab, Serum: <0.03 nmol/L (ref 0.00–0.24)
AChR-modulating Ab: 0 % (ref 0–45)
Acetylchol Block Ab: 17 % (ref 0–25)
Anti-striation Abs: NEGATIVE

## 2023-11-14 LAB — MUSK ANTIBODIES: MuSK Antibodies: <1 U/mL

## 2023-11-14 LAB — METHYLMALONIC ACID, SERUM: Methylmalonic Acid: 217 nmol/L (ref 0–378)

## 2023-11-14 LAB — FACTOR V LEIDEN

## 2023-11-14 LAB — THYROID PEROXIDASE ANTIBODY: Thyroperoxidase Ab SerPl-aCnc: <9 [IU]/mL (ref 0–34)

## 2023-11-14 LAB — VITAMIN B1: Thiamine: 110 nmol/L (ref 66.5–200.0)

## 2023-11-14 LAB — B12 AND FOLATE PANEL
Folate: >20 ng/mL (ref >3.0)
Vitamin B-12: 1045 pg/mL (ref 232–1245)

## 2023-11-14 LAB — VITAMIN A: Vitamin A: 34.9 ug/dL (ref 22.0–69.5)

## 2023-11-14 LAB — VITAMIN E
Vitamin E (Alpha Tocopherol): 18.6 mg/L (ref 9.0–29.0)
Vitamin E(Gamma Tocopherol): 0.5 mg/L (ref 0.5–4.9)

## 2023-11-14 LAB — C-REACTIVE PROTEIN: CRP: 2 mg/L (ref 0–10)

## 2023-11-14 LAB — SEDIMENTATION RATE: Sed Rate: 3 mm/h (ref 0–40)

## 2023-11-14 LAB — FACTOR V LEIDEN (PCR W/GEL ELECTROPHORESIS)

## 2023-11-14 LAB — THYROGLOBULIN ANTIBODY: Thyroglobulin Antibody: <1 [IU]/mL (ref 0.0–0.9)

## 2023-11-17 ENCOUNTER — Other Ambulatory Visit (HOSPITAL_BASED_OUTPATIENT_CLINIC_OR_DEPARTMENT_OTHER): Payer: Self-pay

## 2023-11-17 ENCOUNTER — Other Ambulatory Visit: Payer: Self-pay

## 2023-11-17 DIAGNOSIS — H0288B Meibomian gland dysfunction left eye, upper and lower eyelids: Secondary | ICD-10-CM | POA: Diagnosis not present

## 2023-11-17 DIAGNOSIS — H547 Unspecified visual loss: Secondary | ICD-10-CM | POA: Diagnosis not present

## 2023-11-17 DIAGNOSIS — H0288A Meibomian gland dysfunction right eye, upper and lower eyelids: Secondary | ICD-10-CM | POA: Diagnosis not present

## 2023-11-17 DIAGNOSIS — H02402 Unspecified ptosis of left eyelid: Secondary | ICD-10-CM | POA: Diagnosis not present

## 2023-11-17 MED ORDER — AZITHROMYCIN 250 MG PO TABS
250.0000 mg | ORAL_TABLET | Freq: Every day | ORAL | 0 refills | Status: DC
  Filled 2023-11-17 (×2): qty 30, 30d supply, fill #0

## 2023-11-18 ENCOUNTER — Other Ambulatory Visit (HOSPITAL_COMMUNITY): Payer: Self-pay

## 2023-11-28 ENCOUNTER — Ambulatory Visit (INDEPENDENT_AMBULATORY_CARE_PROVIDER_SITE_OTHER): Payer: Self-pay | Admitting: Plastic Surgery

## 2023-11-28 ENCOUNTER — Encounter: Payer: Self-pay | Admitting: Plastic Surgery

## 2023-11-28 DIAGNOSIS — Z719 Counseling, unspecified: Secondary | ICD-10-CM

## 2023-11-28 NOTE — Progress Notes (Signed)

## 2023-12-01 ENCOUNTER — Other Ambulatory Visit (HOSPITAL_BASED_OUTPATIENT_CLINIC_OR_DEPARTMENT_OTHER): Payer: Self-pay

## 2023-12-01 DIAGNOSIS — I7300 Raynaud's syndrome without gangrene: Secondary | ICD-10-CM | POA: Diagnosis not present

## 2023-12-01 DIAGNOSIS — K121 Other forms of stomatitis: Secondary | ICD-10-CM | POA: Diagnosis not present

## 2023-12-01 DIAGNOSIS — Z6823 Body mass index (BMI) 23.0-23.9, adult: Secondary | ICD-10-CM | POA: Diagnosis not present

## 2023-12-01 DIAGNOSIS — H209 Unspecified iridocyclitis: Secondary | ICD-10-CM | POA: Diagnosis not present

## 2023-12-01 DIAGNOSIS — M79641 Pain in right hand: Secondary | ICD-10-CM | POA: Diagnosis not present

## 2023-12-01 DIAGNOSIS — M79642 Pain in left hand: Secondary | ICD-10-CM | POA: Diagnosis not present

## 2023-12-01 DIAGNOSIS — L959 Vasculitis limited to the skin, unspecified: Secondary | ICD-10-CM | POA: Diagnosis not present

## 2023-12-01 DIAGNOSIS — H04123 Dry eye syndrome of bilateral lacrimal glands: Secondary | ICD-10-CM | POA: Diagnosis not present

## 2023-12-01 DIAGNOSIS — M1991 Primary osteoarthritis, unspecified site: Secondary | ICD-10-CM | POA: Diagnosis not present

## 2023-12-01 MED ORDER — COLCHICINE 0.6 MG PO TABS
0.6000 mg | ORAL_TABLET | Freq: Two times a day (BID) | ORAL | 3 refills | Status: DC
  Filled 2023-12-01: qty 60, 30d supply, fill #0
  Filled 2023-12-31: qty 60, 30d supply, fill #1
  Filled 2024-03-11: qty 60, 30d supply, fill #2

## 2023-12-01 MED ORDER — HYDROXYCHLOROQUINE SULFATE 200 MG PO TABS
200.0000 mg | ORAL_TABLET | Freq: Every day | ORAL | 3 refills | Status: DC
  Filled 2023-12-01: qty 30, 30d supply, fill #0
  Filled 2023-12-08: qty 30, 30d supply, fill #1

## 2023-12-02 ENCOUNTER — Other Ambulatory Visit (HOSPITAL_BASED_OUTPATIENT_CLINIC_OR_DEPARTMENT_OTHER): Payer: Self-pay

## 2023-12-02 MED ORDER — BOOSTRIX 5-2.5-18.5 LF-MCG/0.5 IM SUSY
0.5000 mL | PREFILLED_SYRINGE | Freq: Once | INTRAMUSCULAR | 0 refills | Status: AC
  Filled 2023-12-02: qty 0.5, 1d supply, fill #0

## 2023-12-05 DIAGNOSIS — D225 Melanocytic nevi of trunk: Secondary | ICD-10-CM | POA: Diagnosis not present

## 2023-12-05 DIAGNOSIS — L821 Other seborrheic keratosis: Secondary | ICD-10-CM | POA: Diagnosis not present

## 2023-12-05 DIAGNOSIS — Z85828 Personal history of other malignant neoplasm of skin: Secondary | ICD-10-CM | POA: Diagnosis not present

## 2023-12-05 DIAGNOSIS — Z08 Encounter for follow-up examination after completed treatment for malignant neoplasm: Secondary | ICD-10-CM | POA: Diagnosis not present

## 2023-12-05 DIAGNOSIS — M310 Hypersensitivity angiitis: Secondary | ICD-10-CM | POA: Diagnosis not present

## 2023-12-05 DIAGNOSIS — L570 Actinic keratosis: Secondary | ICD-10-CM | POA: Diagnosis not present

## 2023-12-05 DIAGNOSIS — L814 Other melanin hyperpigmentation: Secondary | ICD-10-CM | POA: Diagnosis not present

## 2023-12-08 ENCOUNTER — Other Ambulatory Visit (HOSPITAL_BASED_OUTPATIENT_CLINIC_OR_DEPARTMENT_OTHER): Payer: Self-pay

## 2023-12-08 ENCOUNTER — Other Ambulatory Visit: Payer: Self-pay

## 2023-12-08 MED ORDER — VALACYCLOVIR HCL 500 MG PO TABS
500.0000 mg | ORAL_TABLET | Freq: Two times a day (BID) | ORAL | 0 refills | Status: AC
  Filled 2023-12-08: qty 6, 3d supply, fill #0

## 2023-12-09 ENCOUNTER — Encounter: Payer: Self-pay | Admitting: Neurology

## 2023-12-10 DIAGNOSIS — I10 Essential (primary) hypertension: Secondary | ICD-10-CM | POA: Diagnosis not present

## 2023-12-10 DIAGNOSIS — E785 Hyperlipidemia, unspecified: Secondary | ICD-10-CM | POA: Diagnosis not present

## 2023-12-10 DIAGNOSIS — Z880 Allergy status to penicillin: Secondary | ICD-10-CM | POA: Diagnosis not present

## 2023-12-10 DIAGNOSIS — Z87892 Personal history of anaphylaxis: Secondary | ICD-10-CM | POA: Diagnosis not present

## 2023-12-10 DIAGNOSIS — Z008 Encounter for other general examination: Secondary | ICD-10-CM | POA: Diagnosis not present

## 2023-12-10 DIAGNOSIS — K589 Irritable bowel syndrome without diarrhea: Secondary | ICD-10-CM | POA: Diagnosis not present

## 2023-12-10 DIAGNOSIS — H547 Unspecified visual loss: Secondary | ICD-10-CM | POA: Diagnosis not present

## 2023-12-10 DIAGNOSIS — Z823 Family history of stroke: Secondary | ICD-10-CM | POA: Diagnosis not present

## 2023-12-10 DIAGNOSIS — H109 Unspecified conjunctivitis: Secondary | ICD-10-CM | POA: Diagnosis not present

## 2023-12-10 DIAGNOSIS — Z803 Family history of malignant neoplasm of breast: Secondary | ICD-10-CM | POA: Diagnosis not present

## 2023-12-10 DIAGNOSIS — D84821 Immunodeficiency due to drugs: Secondary | ICD-10-CM | POA: Diagnosis not present

## 2023-12-10 DIAGNOSIS — Z8249 Family history of ischemic heart disease and other diseases of the circulatory system: Secondary | ICD-10-CM | POA: Diagnosis not present

## 2023-12-10 DIAGNOSIS — Z882 Allergy status to sulfonamides status: Secondary | ICD-10-CM | POA: Diagnosis not present

## 2023-12-11 ENCOUNTER — Encounter (HOSPITAL_BASED_OUTPATIENT_CLINIC_OR_DEPARTMENT_OTHER): Payer: Self-pay | Admitting: Pharmacist

## 2023-12-11 ENCOUNTER — Other Ambulatory Visit (HOSPITAL_BASED_OUTPATIENT_CLINIC_OR_DEPARTMENT_OTHER): Payer: Self-pay

## 2023-12-11 MED ORDER — HYDROXYCHLOROQUINE SULFATE 200 MG PO TABS
400.0000 mg | ORAL_TABLET | ORAL | 5 refills | Status: DC
  Filled 2023-12-11 – 2023-12-15 (×2): qty 48, 30d supply, fill #0

## 2023-12-15 ENCOUNTER — Other Ambulatory Visit (HOSPITAL_BASED_OUTPATIENT_CLINIC_OR_DEPARTMENT_OTHER): Payer: Self-pay

## 2023-12-15 MED ORDER — SYNTHROID 100 MCG PO TABS
100.0000 ug | ORAL_TABLET | Freq: Every morning | ORAL | 4 refills | Status: DC
  Filled 2023-12-15: qty 30, 30d supply, fill #0
  Filled 2024-01-21: qty 30, 30d supply, fill #1
  Filled 2024-02-27: qty 30, 30d supply, fill #2
  Filled 2024-03-29: qty 30, 30d supply, fill #3
  Filled 2024-04-25: qty 30, 30d supply, fill #4
  Filled 2024-05-25: qty 30, 30d supply, fill #5
  Filled 2024-06-22: qty 30, 30d supply, fill #6
  Filled 2024-07-24: qty 30, 30d supply, fill #7
  Filled 2024-09-10 – 2024-09-12 (×2): qty 30, 30d supply, fill #8
  Filled 2024-10-18: qty 30, 30d supply, fill #9
  Filled 2024-10-18: qty 30, 30d supply, fill #0
  Filled 2024-11-13: qty 30, 30d supply, fill #1

## 2023-12-18 ENCOUNTER — Encounter: Payer: Self-pay | Admitting: Neurology

## 2023-12-21 ENCOUNTER — Ambulatory Visit
Admission: RE | Admit: 2023-12-21 | Discharge: 2023-12-21 | Disposition: A | Discharge status: Home / Self Care | Payer: Medicare HMO | Source: Ambulatory Visit | Attending: Neurology | Admitting: Neurology

## 2023-12-21 DIAGNOSIS — H532 Diplopia: Secondary | ICD-10-CM | POA: Diagnosis not present

## 2023-12-21 DIAGNOSIS — G08 Intracranial and intraspinal phlebitis and thrombophlebitis: Secondary | ICD-10-CM

## 2023-12-21 DIAGNOSIS — N766 Ulceration of vulva: Secondary | ICD-10-CM

## 2023-12-21 DIAGNOSIS — L959 Vasculitis limited to the skin, unspecified: Secondary | ICD-10-CM

## 2023-12-21 DIAGNOSIS — H02402 Unspecified ptosis of left eyelid: Secondary | ICD-10-CM

## 2023-12-21 DIAGNOSIS — H5789 Other specified disorders of eye and adnexa: Secondary | ICD-10-CM

## 2023-12-21 DIAGNOSIS — R21 Rash and other nonspecific skin eruption: Secondary | ICD-10-CM

## 2023-12-21 DIAGNOSIS — H5462 Unqualified visual loss, left eye, normal vision right eye: Secondary | ICD-10-CM | POA: Diagnosis not present

## 2023-12-21 DIAGNOSIS — K121 Other forms of stomatitis: Secondary | ICD-10-CM

## 2023-12-21 DIAGNOSIS — Z86718 Personal history of other venous thrombosis and embolism: Secondary | ICD-10-CM

## 2023-12-21 MED ORDER — GADOPICLENOL 0.5 MMOL/ML IV SOLN
6.0000 mL | Freq: Once | INTRAVENOUS | Status: AC | PRN
  Administered 2023-12-21: 6 mL via INTRAVENOUS

## 2023-12-22 ENCOUNTER — Encounter: Payer: Self-pay | Admitting: Neurology

## 2023-12-23 DIAGNOSIS — H524 Presbyopia: Secondary | ICD-10-CM | POA: Diagnosis not present

## 2023-12-23 DIAGNOSIS — Z0100 Encounter for examination of eyes and vision without abnormal findings: Secondary | ICD-10-CM | POA: Diagnosis not present

## 2023-12-26 ENCOUNTER — Other Ambulatory Visit (HOSPITAL_BASED_OUTPATIENT_CLINIC_OR_DEPARTMENT_OTHER): Payer: Self-pay

## 2023-12-26 ENCOUNTER — Ambulatory Visit: Payer: Medicare HMO | Admitting: Plastic Surgery

## 2023-12-26 DIAGNOSIS — N907 Vulvar cyst: Secondary | ICD-10-CM | POA: Diagnosis not present

## 2023-12-29 ENCOUNTER — Ambulatory Visit (HOSPITAL_BASED_OUTPATIENT_CLINIC_OR_DEPARTMENT_OTHER): Payer: Medicare HMO | Admitting: Cardiology

## 2023-12-29 ENCOUNTER — Encounter (HOSPITAL_BASED_OUTPATIENT_CLINIC_OR_DEPARTMENT_OTHER): Payer: Self-pay | Admitting: Cardiology

## 2023-12-29 VITALS — BP 130/82 | HR 82 | Resp 16 | Ht 64.0 in | Wt 143.0 lb

## 2023-12-29 DIAGNOSIS — R009 Unspecified abnormalities of heart beat: Secondary | ICD-10-CM

## 2023-12-29 DIAGNOSIS — Z9221 Personal history of antineoplastic chemotherapy: Secondary | ICD-10-CM

## 2023-12-29 DIAGNOSIS — I2510 Atherosclerotic heart disease of native coronary artery without angina pectoris: Secondary | ICD-10-CM

## 2023-12-29 DIAGNOSIS — Z7189 Other specified counseling: Secondary | ICD-10-CM | POA: Diagnosis not present

## 2023-12-29 DIAGNOSIS — R0609 Other forms of dyspnea: Secondary | ICD-10-CM | POA: Diagnosis not present

## 2023-12-29 NOTE — Patient Instructions (Signed)
Medication Instructions:  Your physician recommends that you continue on your current medications as directed. Please refer to the Current Medication list given to you today.  *If you need a refill on your cardiac medications before your next appointment, please call your pharmacy*  Lab Work: NONE  Testing/Procedures: NONE  Follow-Up: At  [facility], you and your health needs are our priority.  As part of our continuing mission to provide you with exceptional heart care, we have created designated Provider Care Teams.  These Care Teams include your primary Cardiologist (physician) and Advanced Practice Providers (APPs -  Physician Assistants and Nurse Practitioners) who all work together to provide you with the care you need, when you need it.  We recommend signing up for the patient portal called "MyChart".  Sign up information is provided on this After Visit Summary.  MyChart is used to connect with patients for Virtual Visits (Telemedicine).  Patients are able to view lab/test results, encounter notes, upcoming appointments, etc.  Non-urgent messages can be sent to your provider as well.   To learn more about what you can do with MyChart, go to [email or web address].    Your next appointment:   12 month(s)  The format for your next appointment:   In Person  Provider:   Bridgette Christopher, MD     

## 2023-12-29 NOTE — Progress Notes (Signed)
 Cardiology Office Note:  .   Date:  12/29/2023  ID:  Jane Gutierrez, DOB [birth year], MRN [id number] PCP: Nichole Senior, MD  [facility] Providers Cardiologist:  Shelda Bruckner, MD {  History of Present Illness: .   Jane Gutierrez is a 68 y.o. female with PMH breast cancer s/p bilateral mastectomies, adjuvant chemo with taxotere /carboplatin  and herceptin . She was previously seen by Dr. Dann and established care with me on 12/29/23.  Pertinent CV history: Was followed by echo during her chemo/herceptin  treatment, completed in 2015. Echoes: 01/26/13 EF 60% lateral S' 10.9 Grade II diastolic dysfunction 03/25/13 EF 60-65% lat s' 12.8  RV normal 05/27/13 EF lat S' 12.5 RV mildly dilated. Normal function. +RAE. IVC dilated 2.8 cm. Mod TR  RVSP 35-40.  06/04/13 TEE EF 55-60% mild-mod TR 09/09/13 EF 55-60% lateral s' 12.1 RV ok. Mild TR RVSP 25-30 12/06/13 EF 55-60% lateral s' 11.6, GS -21%, RV ok. 3/15 EF 55-60%, lateral s' 12, global longitudinal strain -19.8%, RV normal, mild RAE  Today: Home nurse wellness check noted an extra heartbeat on exam. Feels rare heart racing when she is rushing, but has not noticed palpitations or early beats.  Blood pressure was running high recently, started on losartan , 50 mg was too much but 25 mg is perfect. Watches her salt, almost no caffeine, very rare alcohol . Exercises daily. Has a home gym; has ski machine, rower, pilates. Exercises 1 hr day plus walks dog, bikes, plays pickleball, etc.  Has gluten allergy, sensitive stomach. Eats very healthy.  Has minimal shortness of breath with exertion since chemo, but has not worsened.  ROS: Denies chest pain, shortness of breath at rest or with normal exertion. No PND, orthopnea, LE edema or unexpected weight gain. No syncope or palpitations. ROS otherwise negative except as noted.   Studies Reviewed: SABRA    EKG:  EKG Interpretation Date/Time:  Monday December 29 2023 15:40:08 EST Ventricular Rate:   84 PR Interval:  130 QRS Duration:  90 QT Interval:  404 QTC Calculation: 477 R Axis:   19  Text Interpretation: Normal sinus rhythm Normal ECG Confirmed by Bruckner Shelda [phone]) on 12/29/2023 4:03:37 PM    Physical Exam:   VS:  BP 130/82 (BP Location: Right Arm, Patient Position: Sitting, Cuff Size: Normal)   Pulse 82   Resp 16   Ht 5' 4 (1.626 m)   Wt 143 lb (64.9 kg)   SpO2 97%   BMI 24.55 kg/m    Wt Readings from Last 3 Encounters:  12/29/23 143 lb (64.9 kg)  10/29/23 144 lb (65.3 kg)  01/15/23 141 lb 1.6 oz (64 kg)    GEN: Well nourished, well developed in no acute distress HEENT: Normal, moist mucous membranes NECK: No JVD CARDIAC: regular rhythm, normal S1 and S2, no rubs or gallops. No murmur. VASCULAR: Radial and DP pulses 2+ bilaterally. No carotid bruits RESPIRATORY:  Clear to auscultation without rales, wheezing or rhonchi  ABDOMEN: Soft, non-tender, non-distended MUSCULOSKELETAL:  Ambulates independently SKIN: Warm and dry, no edema NEUROLOGIC:  Alert and oriented x 3. No focal neuro deficits noted. PSYCHIATRIC:  Normal affect    ASSESSMENT AND PLAN: .    CV Risk History of chemotherapy Stable dyspnea with high level exertion -Ca score in 2023 57.4 (75th percentile), last echo 2023 unremarkable -high level activity without limitations -didn't tolerate rosuvastatin  daily, had back pain. Also had this with repatha . Does tolerate rosuvastatin  3 times/week.  Abnormal heart beats -Exam, ECG unremarkable.  -  reviewed red flag warning signs that need immediate medical attention -she will contact me if symptoms develop  CV risk counseling and prevention -recommend heart healthy/Mediterranean diet, with whole grains, fruits, vegetable, fish, lean meats, nuts, and olive oil. Limit salt. -recommend moderate walking, 3-5 times/week for 30-50 minutes each session. Aim for at least 150 minutes.week. Goal should be pace of 3 miles/hours, or walking 1.5 miles  in 30 minutes -recommend avoidance of tobacco products. Avoid excess alcohol .  Dispo: 1 year or sooner as needed  Signed, Shelda Bruckner, MD   Shelda Bruckner, MD, PhD, [facility]  [facility]  [place]  [facility] at [facility] [address] [place], [place] [address] [phone]

## 2023-12-30 ENCOUNTER — Ambulatory Visit: Payer: Medicare HMO | Admitting: Neurology

## 2023-12-30 ENCOUNTER — Encounter: Payer: Self-pay | Admitting: Neurology

## 2023-12-30 VITALS — BP 118/70 | HR 78 | Ht 64.5 in | Wt 142.0 lb

## 2023-12-30 DIAGNOSIS — H5789 Other specified disorders of eye and adnexa: Secondary | ICD-10-CM

## 2023-12-30 NOTE — Progress Notes (Signed)
 [facility]    Provider:  Dr Gutierrez Requesting Provider: Nichole Senior, MD Primary Care Provider:  Nichole Senior, MD  CC:  Headaches/stabbing/new  She is improved, stil lhave the gritty feeling but not the redness or darining and the lid appears better. We discussed behcet's and there is a behcet's disease center at [facility] will refr [phone] [place]. Will send up all our notes as well as labwork and Jane Gutierrez notes as well in rheumatology. She is on plaquenil  and colchicine  for the ulcers.   his is a very nice patient with recent drainage, irritation, redness of the left eye with a lowered left lid whose been treated for eye infections with antibiotics, steroids, without relief.  Likely inflammatory eye disease and was recently started on Plaquenil .  Behcet's disease or other autoimmune disease has been hypothesized.  Neurology workup has been unremarkable including: Normal MRI of the orbits with and without, unremarkable MRI of the brain with normal for aging chronic microvascular ischemic change, Center for MRV however they performed an MRA instead we will discuss with patient today the MRA was unremarkable, CTA of the head neck showed stenosis within the left vertebral artery V2 segment which could be artifactual, CTA of the head was unremarkable, no signs of vasculitis.  Blood work included normal sed rate, CRP, thyroglobulin and thyroperoxidase antibodies, factor V Leiden, myasthenia gravis antibodies and musk antibodies, normal vitamin A , normal B12 and folate, normal B1 and vitamin E, TSH was reduced but T4 free was normal.  She was evaluated by ophthalmology, possible severe KCS, she was referred to Jane. Gust for evaluation for possible other chronic allergic conjunctivitis OS, severe keratoconjunctivitis sicca OS greater than OD given Xlidra and tears.  Fluorometholone  4 days for 2 weeks then 2 days for 2 weeks.  Isolated retinal hemorrhage OD referred to Jane. Elner for  evaluation and he stated everything was fine no hemorrhage(retina specialist).  Nuclear sclerosis OU, mild minimal impact on vision at this time.  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  Patient complains of symptoms per HPI as well as the following symptoms: new . Pertinent negatives and positives per HPI. All others negative   HPI:  Jane Gutierrez is a 68 y.o. female here as requested by Jane Gutierrez, OD for diplopia rule out myasthenia gravis. has IBS (irritable bowel syndrome); Lyme disease; Asthma; Hypothyroidism; Contact lens/glasses fitting; SIRS (systemic inflammatory response syndrome) (HCC); Anemia; Dehydration; Fever; Other chronic pulmonary heart diseases; Pre-operative cardiovascular examination; Acquired absence of breast and absent nipple, bilateral; Dyspnea on exertion; Heart palpitations; Right hip pain; Right knee pain; Breast cancer of upper-outer quadrant of left female breast (HCC); Dyspnea; Abnormal weight loss; Alopecia; Benign neoplasm of colon; Chronic sinusitis; Cough; Family history of diseases of the blood and blood-forming organs and certain disorders involving the immune mechanism; Fatigue; Headache; Hyperlipidemia; Joint pain; Localized swelling, mass and lump, trunk; Malignant neoplasm of cervix uteri (HCC); Migraine without aura, not refractory; Myopathy; Nontoxic single thyroid  nodule; Osteoarthritis; Pain in left elbow; Pain in right hand; Personal history of other specified conditions; Polyneuropathy; Psychophysiologic insomnia; Rash and other nonspecific skin eruption; Raynaud's disease; Thrombophlebitis of superficial veins of lower extremity; Varicose veins of other specified sites; Wheezing; Hardening of the aorta (main artery of the heart) (HCC); Genital herpes simplex; Encounter for counseling; Breast pain; Gastroesophageal reflux disease; Constipation;  Basal cell carcinoma; Corneal ulcer of  left eye; and Status post bilateral breast reconstruction on their problem list.  Per Jane. Shawnie notes, patient is in pain OS, reported gritty, mucus, eye changes, double vision noticed Saturday night during a play downtown, images 1 on top of the other, has not been able to use contacts, diagnosed with intermittent vertical diplopia and referred to us  to rule out myasthenia gravis, thyroid  disorder, vascular etiology versus myogenic decompensation, difficult to read handwritten notes.  Since last November she has been having feelings of mucous in the left eye, they treated it with antobiotics first and then steroids then got a corneal ulcer then mucous production with grittiness, they thought inflammatory, mucous got better but still has crusty eye. In the past she had ENT encapsulated cyst removed behind the left eye. In April she had vasculitis, she saw Jane. Ishmael at [facility] rheumatology and all labwork was negative. Last December coffee started tasting bad and in the last month or few weeks she has tingling in the lips, tingling in the left cheek, burning in the right thigh. She still has crust left eye in the morning, she has been using ocuvite wipes on the recommendation of Jane, Jane Gutierrez, she has FHx Jane EPP, MD, sarcoidosis, other autoimmune disorders. The lid is droopier on the left, worse first thing in the morning and better as the day goes by and gets wider open during the day, she has tearing in the left eye but again getting better, no head pain associated (she had one ice pick headache 4 months ago ongoing since a child but not associated, no cluster headache symptoms for example), Her lid is almost all the way closed in the morning (showed me a pictures and the lid if below the pupil line and gets better as the day goes on and ongoing even sine last December. Does not get worse with fatigue, she has had double vision twice once in the middle of  September and in December lasted 5 minutes and she blinked and it resolved. The droopy lid is getting worse. No swallowing problems, no new cough or new SOB (has had it since chemo for breast cancer), no swallowing problems, no dysphagia, nothing going down the wrong way, no cjking when eating or driking, no weakness of head ext flexion, she can get up off the floor without her hands, she plays pickle ball, 10-12k steps a day. She has ulcers in her mouth all the time. Her left eye vision had eteriorated a little worse. She can;t see the ball on the left in the left eye peripherally to the left.   Patient complains of symptoms per HPI as well as the following symptoms: eye pain . Pertinent negatives and positives per HPI. All others negative  Reviewed images and labs:   EXAM: CT HEAD WITHOUT CONTRAST   TECHNIQUE: Contiguous axial images were obtained from the base of the skull through the vertex without intravenous contrast.   RADIATION DOSE REDUCTION: This exam was performed according to the departmental dose-optimization program which includes automated exposure control, adjustment of the mA and/or kV according to patient size and/or use of iterative reconstruction technique.   COMPARISON:  None.   FINDINGS: Brain: No acute intracranial findings are seen in noncontrast CT brain. There are no signs of bleeding within the cranium. Cortical sulci are prominent.   Vascular: Unremarkable.   Skull: Unremarkable.   Sinuses/Orbits: Unremarkable.   Other: None   IMPRESSION: No acute intracranial findings are seen in noncontrast CT brain. Atrophy.  Electronically Signed   By: Gearldine Mary M.D.   On: 02/25/2022 12:44     Latest Ref Rng & Units 10/29/2023    9:44 AM 11/08/2022    8:23 AM 08/06/2022    7:36 AM  CMP  Glucose 70 - 99 mg/dL 90     BUN 8 - 27 mg/dL 11     Creatinine 9.42 - 1.00 mg/dL 9.38     Sodium 865 - 855 mmol/L 139     Potassium 3.5 - 5.2 mmol/L 4.4      Chloride 96 - 106 mmol/L 100     CO2 20 - 29 mmol/L 27     Calcium  8.7 - 10.3 mg/dL 9.4     Total Protein 6.0 - 8.5 g/dL 7.2  6.4  6.9   Total Bilirubin 0.0 - 1.2 mg/dL 0.4  0.4  0.4   Alkaline Phos 44 - 121 IU/L 76  56  57   AST 0 - 40 IU/L 35  25  30   ALT 0 - 32 IU/L 26  19  22        Latest Ref Rng & Units 10/29/2023    9:44 AM 11/28/2014    8:12 AM 09/01/2014    7:07 AM  CBC  WBC 3.4 - 10.8 x10E3/uL 5.1  6.0    Hemoglobin 11.1 - 15.9 g/dL 85.0  85.3  84.6   Hematocrit 34.0 - 46.6 % 46.9  44.3    Platelets 150 - 450 x10E3/uL 279  211      Recent Results (from the past 2160 hours)  Sedimentation rate     Status: None   Collection Time: 10/29/23  9:44 AM  Result Value Ref Range   Sed Rate 3 0 - 40 mm/hr  C-reactive protein     Status: None   Collection Time: 10/29/23  9:44 AM  Result Value Ref Range   CRP 2 0 - 10 mg/L  Thyroglobulin antibody     Status: None   Collection Time: 10/29/23  9:44 AM  Result Value Ref Range   Thyroglobulin Antibody <1.0 0.0 - 0.9 IU/mL    Comment: Thyroglobulin Antibody measured by [employer] Methodology It should be noted that the presence of thyroglobulin antibodies may not be pathogenic nor diagnostic, especially at very low levels. The assay manufacturer has found that four percent of individuals without evidence of thyroid  disease or autoimmunity will have positive TgAb levels up to 4 IU/mL.   Thyroid  peroxidase antibody     Status: None   Collection Time: 10/29/23  9:44 AM  Result Value Ref Range   Thyroperoxidase Ab SerPl-aCnc <9 0 - 34 IU/mL  Factor V Leiden     Status: None   Collection Time: 10/29/23  9:44 AM  Result Value Ref Range   Factor V Leiden Result Comment     Comment: G-G (Normal-Normal) No factor V Leiden mutation present.    Interpretation: Comment     Comment: While the patient does not possess this risk factor, other thrombotic risk factors may be detected through systematic clinical laboratory  analysis.    Methodology: Comment     Comment: Patient DNA was evaluated for the factor V Leiden mutation at nucleotide 1691 using allele specific PCR technology followed by gel electrophoresis.    Comments: Comment     Comment: Simultaneous Risks: If a patient possesses two or more congenital or acquired thrombophilic risk factors, the risk of thrombosis may rise to more than the sum of  the risk ratios for the individual risk factors. For instance, a combination of the prothrombin G20210A mutation and the factor V Leiden mutation may confer an increase in thrombotic risk in the range of 20-30 fold.  Recommendations for Genetic Counseling: The factor V Leiden mutation is an inherited characteristic. If the mutation is present, we recommend that the patient and their family consider genetic counseling to obtain additional information on inheritance and to identify other family members at risk.  Testing Characteristics: Genetic testing provides exceptionally high sensitivity and specificity. Inaccurate results are limited to rare polymorphisms in primer binding sites and to misidentification of specimens by collectors or laboratory personnel. This assay detects only the factor V Leiden mutati on and does not detect other genetic abnormalities.  This test was developed and its performance characteristics determined by [facility]. It has not been cleared or approved by the Food and Drug Administration.  References: Bertina RM, et al. Kerri Fish. [phone]. [place] and [place] Mol.Diagn. 2001;6(3):201. Emmerich J, et al. Kerri Fish. 7998;13:190-83. Rinteke C, et al. [place]. [phone].   Myasthenia Gravis Profile     Status: None   Collection Time: 10/29/23  9:44 AM  Result Value Ref Range   AChR Binding Ab, Serum <0.03 0.00 - 0.24 nmol/L    Comment:                                Negative:   0.00 - 0.24                                Borderline:  0.25 - 0.40                                Positive:         >0.40    Acetylchol Block Ab 17 0 - 25 %    Comment:                                Negative:      0 - 25                                Borderline:   26 - 30                                Positive:         >30    AChR-modulating Ab 0 0 - 45 %    Comment:                        Interpretive Information:                         Negative:             0 - 45%                         Positive:               > 45% No single value for AChR-modulating antibody should be used as a  sole basis for diagnosis or response to therapy.    Anti-striation Abs Negative Neg:<1:100   Reflex Information Comment     Comment: Reflex test indicated.  MuSK Antibodies     Status: None   Collection Time: 10/29/23  9:44 AM  Result Value Ref Range   MuSK Antibodies <1.0 U/mL    Comment:  Reference Range:   Negative: <1.0   Positive: 1.0 or higher  This test was developed and its performance characteristics determined by [facility]. It has not been cleared or approved by the Food and Drug Administration.   CBC with Differential/Platelets     Status: Abnormal   Collection Time: 10/29/23  9:44 AM  Result Value Ref Range   WBC 5.1 3.4 - 10.8 x10E3/uL    Comment: **Effective November 24, 2023 profile [id number] WBC will be made**   non-orderable as a stand-alone order code.    RBC 5.17 3.77 - 5.28 x10E6/uL   Hemoglobin 14.9 11.1 - 15.9 g/dL   Hematocrit 53.0 (H) 65.9 - 46.6 %   MCV 91 79 - 97 fL   MCH 28.8 26.6 - 33.0 pg   MCHC 31.8 31.5 - 35.7 g/dL   RDW 86.6 88.2 - 84.5 %   Platelets 279 150 - 450 x10E3/uL   Neutrophils 43 Not Estab. %   Lymphs 46 Not Estab. %   Monocytes 8 Not Estab. %   Eos 2 Not Estab. %   Basos 1 Not Estab. %   Neutrophils Absolute 2.2 1.4 - 7.0 x10E3/uL   Lymphocytes Absolute 2.3 0.7 - 3.1 x10E3/uL   Monocytes Absolute 0.4 0.1 - 0.9 x10E3/uL   EOS (ABSOLUTE) 0.1 0.0 - 0.4 x10E3/uL   Basophils Absolute 0.1 0.0 - 0.2  x10E3/uL   Immature Granulocytes 0 Not Estab. %   Immature Grans (Abs) 0.0 0.0 - 0.1 x10E3/uL  Comprehensive metabolic panel     Status: None   Collection Time: 10/29/23  9:44 AM  Result Value Ref Range   Glucose 90 70 - 99 mg/dL   BUN 11 8 - 27 mg/dL   Creatinine, Ser 9.38 0.57 - 1.00 mg/dL   eGFR 98 >40 fO/fpw/8.26   BUN/Creatinine Ratio 18 12 - 28   Sodium 139 134 - 144 mmol/L   Potassium 4.4 3.5 - 5.2 mmol/L   Chloride 100 96 - 106 mmol/L   CO2 27 20 - 29 mmol/L   Calcium  9.4 8.7 - 10.3 mg/dL   Total Protein 7.2 6.0 - 8.5 g/dL   Albumin  4.6 3.9 - 4.9 g/dL   Globulin, Total 2.6 1.5 - 4.5 g/dL   Bilirubin Total 0.4 0.0 - 1.2 mg/dL   Alkaline Phosphatase 76 44 - 121 IU/L   AST 35 0 - 40 IU/L   ALT 26 0 - 32 IU/L  Vitamin A      Status: None   Collection Time: 10/29/23  9:44 AM  Result Value Ref Range   Vitamin A  34.9 22.0 - 69.5 ug/dL    Comment: Reference intervals for vitamin A  determined from [facility] internal studies. Individuals with vitamin A  less than 20 ug/dL are considered vitamin A  deficient and those with serum concentrations less than 10 ug/dL are considered severely deficient. This test was developed and its performance characteristics determined by [facility]. It has not been cleared or approved by the Food and Drug Administration.   B12 and Folate Panel     Status: None   Collection Time: 10/29/23  9:44 AM  Result Value Ref Range  Vitamin B-12 1,045 232 - 1,245 pg/mL   Folate >20.0 >3.0 ng/mL    Comment: A serum folate concentration of less than 3.1 ng/mL is considered to represent clinical deficiency.   Methylmalonic acid, serum     Status: None   Collection Time: 10/29/23  9:44 AM  Result Value Ref Range   Methylmalonic Acid 217 0 - 378 nmol/L  Vitamin B1     Status: None   Collection Time: 10/29/23  9:44 AM  Result Value Ref Range   Thiamine 110.0 66.5 - 200.0 nmol/L  Vitamin E     Status: None   Collection Time: 10/29/23  9:44 AM  Result Value Ref  Range   Vitamin E (Alpha Tocopherol) 18.6 9.0 - 29.0 mg/L   Vitamin E(Gamma Tocopherol) 0.5 0.5 - 4.9 mg/L    Comment: Reference intervals for alpha and gamma-tocopherol determined from [facility] and Nutrition Examination Survey, 2005-2006. Individuals with alpha-tocopherol levels less than 5.0 mg/L are considered vitamin E deficient.   TSH Rfx on Abnormal to Free T4     Status: Abnormal   Collection Time: 10/29/23  9:50 AM  Result Value Ref Range   TSH 0.227 (L) 0.450 - 4.500 uIU/mL  T4F     Status: None   Collection Time: 10/29/23  9:50 AM  Result Value Ref Range   T4,Free (Direct) 1.53 0.82 - 1.77 ng/dL     HPI 05/24/7975:  Keyatta LASHEIKA ORTLOFF is a 67 y.o. female here as requested by Jane Senior, MD for stabbing headaches. PMHx raynauds, migraines, icepick headaches as a child (these are different). She is here alone and reports when she was in 6th grade she was diagnosed with ice-pick headaches always in her right temple. In 1987 she had a headache and she has a benign frontal lobe cyst that was removed. 345 Monday morning she woke up and she was having stabbing headaches, quick, in the back of the head, not like her previous headaches completely different, she called Jane. Nichole and got a toradol  shot, she had it for 16 hours, she slept Tuesday morning had them again, yesterday she feltlike she was hit with a mack truck, head was throbbing, last night the jabbing started again, 20 of them, can occur 100s of times a day, no autonomic symptoms. Today she had them for 6 hours her head it is wearing her out. Headaches are positional and exertional with vision changes we need further imaging. No other focal neurologic deficits, associated symptoms, inciting events or modifiable factors.  Reviewed notes, labs and imaging from outside physicians, which showed  CT head (personally reviewed images and agree): FINDINGS: Brain: No acute intracranial findings are seen in noncontrast CT brain. There  are no signs of bleeding within the cranium. Cortical sulci are prominent.   Vascular: Unremarkable.   Skull: Unremarkable.   Sinuses/Orbits: Unremarkable.   Other: None   IMPRESSION: No acute intracranial findings are seen in noncontrast CT brain. Atrophy.  In 2018 had rehaumatologic labs: ana mildly positive 1:80 unlikely clinically significant, rf and cc neg, esr/crp nml  Review of Systems: Patient complains of symptoms per HPI as well as the following symptoms stabbing headace. Pertinent negatives and positives per HPI. All others negative.   Social History   Socioeconomic History   Marital status: Married    Spouse name: Not on file   Number of children: 1   Years of education: Not on file   Highest education level: Not on file  Occupational History  Not on file  Tobacco Use   Smoking status: Never   Smokeless tobacco: Never  Vaping Use   Vaping status: Never Used  Substance and Sexual Activity   Alcohol  use: Not Currently   Drug use: No   Sexual activity: Yes  Other Topics Concern   Not on file  Social History Narrative   Not on file   Social Drivers of Health   Financial Resource Strain: Not on file  Food Insecurity: Not on file  Transportation Needs: Not on file  Physical Activity: Not on file  Stress: Not on file  Social Connections: Unknown (05/07/2022)   Received from [facility], [facility]   Social Network    Social Network: Not on file  Intimate Partner Violence: Unknown (03/29/2022)   Received from [employer], [facility]   HITS    Physically Hurt: Not on file    Insult or Talk Down To: Not on file    Threaten Physical Harm: Not on file    Scream or Curse: Not on file    Family History  Problem Relation Age of Onset   Cancer Mother 98       uterine, mult myeloma   CVA Father    Cancer Sister 25       breast   Leukemia Sister        CLL   Cancer Maternal Grandmother 77       breast   Celiac disease Daughter     Migraines Daughter     Past Medical History:  Diagnosis Date   Abnormally small mouth    ADD (attention deficit disorder)    ADHD    Allergy    Anxiety    pt reported she doesn't have anxiety   Asthma    prn inhaler   Blood transfusion without reported diagnosis    Breast cancer (HCC)    Dental crowns present    Difficulty swallowing pills    History of breast cancer    History of cervical cancer    History of palpitations    last echo 08/22/2014   Hypothyroidism    IBS (irritable bowel syndrome)    Vasculitis (HCC)    Jane. Ivin last year dx    Patient Active Problem List   Diagnosis Date Noted   Corneal ulcer of left eye 07/08/2023   Gastroesophageal reflux disease 01/15/2023   Constipation 01/15/2023   Breast pain 05/28/2022   Encounter for counseling 10/16/2021   Hardening of the aorta (main artery of the heart) (HCC) 03/02/2021   Genital herpes simplex 02/22/2021   Family history of diseases of the blood and blood-forming organs and certain disorders involving the immune mechanism 10/21/2019   Polyneuropathy 10/21/2019   Rash and other nonspecific skin eruption 10/21/2019   Raynaud's disease 01/12/2019   Osteoarthritis 11/12/2018   Cough 10/27/2017   Pain in right hand 09/02/2017   Chronic sinusitis 08/18/2017   Wheezing 08/18/2017   Psychophysiologic insomnia 01/06/2017   Pain in left elbow 01/02/2016   Dyspnea 09/11/2015   Abnormal weight loss 08/17/2015   Alopecia 08/17/2015   Fatigue 08/17/2015   Joint pain 08/17/2015   Breast cancer of upper-outer quadrant of left female breast (HCC) 06/28/2015   Nontoxic single thyroid  nodule 12/28/2014   Right hip pain 09/19/2014   Right knee pain 09/19/2014   Dyspnea on exertion 08/08/2014   Heart palpitations 08/08/2014   Basal cell carcinoma 06/14/2014   Status post bilateral breast reconstruction 03/16/2014   Acquired  absence of breast and absent nipple, bilateral 03/09/2014   Myopathy 02/01/2014    Localized swelling, mass and lump, trunk 11/22/2013   Pre-operative cardiovascular examination 09/09/2013   Other chronic pulmonary heart diseases 05/27/2013   SIRS (systemic inflammatory response syndrome) (HCC) 04/14/2013   Anemia 04/14/2013   Dehydration 04/14/2013   Fever 04/14/2013   IBS (irritable bowel syndrome)    Lyme disease    Asthma    Hypothyroidism    Contact lens/glasses fitting    Hyperlipidemia 11/16/2012   Headache 02/05/2012   Benign neoplasm of colon 09/17/2011   Migraine without aura, not refractory 09/17/2011   Malignant neoplasm of cervix uteri (HCC) 09/06/2009   Personal history of other specified conditions 09/06/2009   Thrombophlebitis of superficial veins of lower extremity 09/06/2009   Varicose veins of other specified sites 09/06/2009    Past Surgical History:  Procedure Laterality Date   ABDOMINAL HYSTERECTOMY     ABDOMINOPLASTY  03/28/2004   AXILLARY SENTINEL NODE BIOPSY  12/31/2012   Procedure: AXILLARY SENTINEL NODE BIOPSY;  Surgeon: Donnice Bury, MD;  Location: [facility];  Service: General;  Laterality: Bilateral;  bilateral sentinel node   BRAIN SURGERY     removal benign frontal lobe cyst in 1987   BREAST ENHANCEMENT SURGERY Bilateral ~ 2008   BREAST IMPLANT EXCHANGE Bilateral 12/11/2017   Procedure: BILATERAL IMPLANT EXCHANGE;  Surgeon: Jane Gutierrez Estefana RAMAN, DO;  Location: [facility];  Service: Plastics;  Laterality: Bilateral;   BREAST RECONSTRUCTION WITH PLACEMENT OF TISSUE EXPANDER AND FLEX HD (ACELLULAR HYDRATED DERMIS)  12/31/2012   Procedure: BREAST RECONSTRUCTION WITH PLACEMENT OF TISSUE EXPANDER AND FLEX HD (ACELLULAR HYDRATED DERMIS);  Surgeon: Estefana Reichert, DO;  Location: [facility];  Service: Plastics;  Laterality: Bilateral;  bilateral immediate breast reconstruction with expanders and flex hd    CAPSULOTOMY Left 09/01/2014   Procedure: CAPSULOTOMY OF LEFT BREAST WITH LIPOFILLING FOR  SYMMETRY;  Surgeon: Estefana Reichert, DO;  Location: [facility];  Service: Plastics;  Laterality: Left;   CERVICAL CERCLAGE     DIAGNOSTIC LAPAROSCOPY  05/12/2003   right partial salpingectomy; lysis paraovarian adhesions left   DILATION AND CURETTAGE OF UTERUS  2005   ENDOMETRIAL FULGURATION  05/12/2003   FACIAL COSMETIC SURGERY  ~ 2010   FOOT SURGERY Bilateral 07/2012   GYNECOLOGIC CRYOSURGERY  1998   INCISION AND DRAINAGE OF WOUND Left 04/28/2013   Procedure: IRRIGATION AND DEBRIDEMENT LEFT BREAST WOUND, POSSIBLE CLOSURE OF WOUND WITH DRAIN PLACEMENT;  Surgeon: Estefana Reichert, DO;  Location: [facility];  Service: Plastics;  Laterality: Left;   LATISSIMUS FLAP TO BREAST Left 09/29/2013   Procedure: LATISSIMUS FLAP TO BREAST WITH TISSUE EXPANDER PLACEMENT FOR LEFT BREAST RECONSTRUCTION;  Surgeon: Estefana Reichert, DO;  Location: [facility];  Service: Plastics;  Laterality: Left;   LIPECTOMY Bilateral 03/28/2004   hip   LIPOSUCTION Bilateral 03/09/2014   Procedure: LIPOSUCTION ABDOMEN W/ LIPOFILLING LATERAL BILATERAL BREAST;  Surgeon: Estefana Reichert, DO;  Location: [facility];  Service: Plastics;  Laterality: Bilateral;  LIPOSUCTION NECK   LIPOSUCTION WITH LIPOFILLING Bilateral 09/01/2014   Procedure: LIPOSUCTION WITH LIPOFILLING;  Surgeon: Estefana Reichert, DO;  Location: [facility];  Service: Plastics;  Laterality: Bilateral;   LYSIS OF ADHESION  03/28/2004   pelvic   MASTECTOMY     PORT-A-CATH REMOVAL Right 03/09/2014   Procedure: REMOVAL PORT-A-CATH;  Surgeon: Estefana Reichert, DO;  Location: [facility];  Service: Plastics;  Laterality:  Right;   PORTACATH PLACEMENT  01/27/2013   Procedure: INSERTION PORT-A-CATH;  Surgeon: Donnice Bury, MD;  Location: [facility];  Service: General;  Laterality: Right;   RADIOFREQUENCY ABLATION NERVES  05/12/2003   uterosacral nerve ablation   REMOVAL OF BILATERAL TISSUE EXPANDERS WITH  PLACEMENT OF BILATERAL BREAST IMPLANTS Bilateral 03/09/2014   Procedure: REMOVAL OF BILATERAL TISSUE EXPANDERS WITH PLACEMENT OF BILATERAL BREAST IMPLANTS;  Surgeon: Estefana Reichert, DO;  Location: [facility];  Service: Plastics;  Laterality: Bilateral;   TEE WITHOUT CARDIOVERSION N/A 06/04/2013   Procedure: TRANSESOPHAGEAL ECHOCARDIOGRAM (TEE);  Surgeon: Toribio JONELLE Fuel, MD;  Location: [facility];  Service: Cardiovascular;  Laterality: N/A;   TISSUE EXPANDER PLACEMENT Left 06/16/2013   Procedure: LEFT BREAST TISSUE EXPANDER REMOVAL;  Surgeon: Estefana Reichert, DO;  Location: [facility];  Service: Plastics;  Laterality: Left;   TISSUE EXPANDER PLACEMENT Left 09/29/2013   Procedure: TISSUE EXPANDER;  Surgeon: Estefana Reichert, DO;  Location: [facility];  Service: Plastics;  Laterality: Left;   TOTAL ABDOMINAL HYSTERECTOMY W/ BILATERAL SALPINGOOPHORECTOMY  03/28/2004   TOTAL MASTECTOMY  12/31/2012   Procedure: TOTAL MASTECTOMY;  Surgeon: Donnice Bury, MD;  Location: [facility];  Service: General;  Laterality: Bilateral;  bilateral total mastectomies    Current Outpatient Medications  Medication Sig Dispense Refill   albuterol  (VENTOLIN  HFA) 108 (90 Base) MCG/ACT inhaler Inhale 2 puffs into the lungs every 6 (six) hours as directed (Patient taking differently: Inhale 2 puffs into the lungs every 6 (six) hours as needed.) 6.7 g 11   aspirin EC 81 MG tablet Take 81 mg by mouth daily. Swallow whole.     Cholecalciferol (VITAMIN D) 125 MCG (5000 UT) CAPS Take 1 capsule by mouth daily at 6 (six) AM.     colchicine  0.6 MG tablet Take 1 tablet (0.6 mg total) by mouth 1-2 times daily as needed (Patient taking differently: Take 0.6 mg by mouth daily as needed.) 60 tablet 3   diphenhydramine -acetaminophen  (TYLENOL  PM) 25-500 MG TABS tablet Take 1 tablet by mouth at bedtime as needed.     EPINEPHrine  (EPIPEN  2-PAK) 0.3 mg/0.3 mL IJ SOAJ injection Use as directed as needed. 2  each 1   Hydroxychloroquine  Sulfate (PLAQUENIL  PO) Take 100 mg by mouth daily.     hyoscyamine  (OSCIMIN ) 0.125 MG SL tablet Take 1 tablet (0.125 mg total) under tongue 2- 3 times daily as needed. 120 tablet 3   losartan  (COZAAR ) 50 MG tablet Take 25 mg by mouth daily.     Multiple Vitamin (MULTIVITAMIN WITH MINERALS) TABS Take 1 tablet by mouth daily.     rosuvastatin  (CRESTOR ) 5 MG tablet Take 1 tablet (5 mg total) by mouth 2 (two) times a week. (Patient taking differently: Take 5 mg by mouth 3 (three) times a week.) 30 tablet 5   SYNTHROID  100 MCG tablet Take 1 tablet (100 mcg total) by mouth in the morning. 90 tablet 4   vitamin E 600 UNIT capsule Take 800 Units by mouth daily.      acyclovir  ointment (ZOVIRAX ) 5 % Apply 1 application topically to the affected area(s) every 3 hours, 6 times a day. (Patient not taking: Reported on 12/30/2023) 30 g 2   COVID-19 mRNA bivalent vaccine, Pfizer, (PFIZER COVID-19 VAC BIVALENT) injection Inject into the muscle. (Patient not taking: Reported on 12/30/2023) 0.3 mL 0   COVID-19 mRNA vaccine, Pfizer, (COMIRNATY ) syringe Inject into the muscle. 0.3 mL 0   dextroamphetamine  (DEXTROSTAT ) 10 MG  tablet Take 2 tablets (20 mg total) by mouth 2 (two) times daily. 120 tablet 0   phenazopyridine  (PYRIDIUM ) 200 MG tablet Take 1 tablet (200 mg total) by mouth 3 (three) times daily for 2 days (Patient not taking: Reported on 12/30/2023) 6 tablet 2   rifaximin  (XIFAXAN ) 550 MG TABS tablet Take 1 tablet (550 mg total) by mouth 3 (three) times daily for 14 days (Patient not taking: Reported on 12/30/2023) 42 tablet 3   SHINGRIX injection  (Patient not taking: Reported on 12/30/2023)     No current facility-administered medications for this visit.    Allergies as of 12/30/2023 - Review Complete 12/30/2023  Allergen Reaction Noted   Betadine [povidone iodine] Anaphylaxis 12/25/2012   Doxycycline  Rash 06/19/2018   Iodine Anaphylaxis 03/04/2008   Shellfish-derived products  Anaphylaxis 12/14/2012   Gluten meal  07/08/2023   Omeprazole   07/08/2023   Povidone-iodine Other (See Comments) 12/02/2022   Rosuvastatin  calcium  Other (See Comments) 08/08/2022   Amoxicillin  Rash 05/07/2021   Ginger Rash 08/25/2014   Penicillins Rash 03/04/2008   Sulfamethoxazole-trimethoprim  Rash 03/04/2008    Vitals: BP 118/70 (Cuff Size: Normal)   Pulse 78   Ht 5' 4.5 (1.638 m)   Wt 142 lb (64.4 kg)   BMI 24.00 kg/m  Last Weight:  Wt Readings from Last 1 Encounters:  12/30/23 142 lb (64.4 kg)   Last Height:   Ht Readings from Last 1 Encounters:  12/30/23 5' 4.5 (1.638 m)     Physical exam: Exam: Gen: NAD, conversant, well nourised, obese, well groomed                     CV: RRR, no MRG. No Carotid Bruits. No peripheral edema, warm, nontender Eyes: Conjunctivae clear without exudates or hemorrhage  Neuro: Detailed Neurologic Exam  Speech:    Speech is normal; fluent and spontaneous with normal comprehension.  Cognition:    The patient is oriented to person, place, and time;     recent and remote memory intact;     language fluent;     normal attention, concentration,     fund of knowledge Cranial Nerves:    The pupils are equal, round, and reactive to light. Attempted could not visualize fundi. Reduced vision left eye upper outer quadrant , otherwise Visual fields are full to finger confrontation. Extraocular movements are intact. Trigeminal sensation is intact and the muscles of mastication are normal. Left lid ptosis, otherwise The face is symmetric. The palate elevates in the midline. Hearing intact. Voice is normal. Shoulder shrug is normal. The tongue has normal motion without fasciculations.   Coordination: nml  Gait: nml  Motor Observation:    No asymmetry, no atrophy, and no involuntary movements noted. Tone:    Normal muscle tone.    Posture:    Posture is normal. normal erect    Strength:    Strength is V/V in the upper and lower limbs.       Sensation: intact to LT     Reflex Exam:  DTR's:    Deep tendon reflexes in the upper and lower extremities are normal bilaterally.   Toes:    The toes are downgoing bilaterally.   Clonus:    Clonus is absent.     Assessment/Plan:  68 y.o. female here as requested by Jane Gutierrez, OD for diplopia rule out myasthenia gravis. has IBS (irritable bowel syndrome); Lyme disease; Asthma; Hypothyroidism; Contact lens/glasses fitting; SIRS (systemic inflammatory response syndrome) (HCC); Anemia; Dehydration;  Fever; Other chronic pulmonary heart diseases; Pre-operative cardiovascular examination; Acquired absence of breast and absent nipple, bilateral; Dyspnea on exertion; Heart palpitations; Right hip pain; Right knee pain; Breast cancer of upper-outer quadrant of left female breast (HCC); Dyspnea; Abnormal weight loss; Alopecia; Benign neoplasm of colon; Chronic sinusitis; Cough; Family history of diseases of the blood and blood-forming organs and certain disorders involving the immune mechanism; Fatigue; Headache; Hyperlipidemia; Joint pain; Localized swelling, mass and lump, trunk; Malignant neoplasm of cervix uteri (HCC); Migraine without aura, not refractory; Myopathy; Nontoxic single thyroid  nodule; Osteoarthritis; Pain in left elbow; Pain in right hand; Personal history of other specified conditions; Polyneuropathy; Psychophysiologic insomnia; Rash and other nonspecific skin eruption; Raynaud's disease; Thrombophlebitis of superficial veins of lower extremity; Varicose veins of other specified sites; Wheezing; Hardening of the aorta (main artery of the heart) (HCC); Genital herpes simplex; Encounter for counseling; Breast pain; Gastroesophageal reflux disease; Constipation; Basal cell carcinoma; Corneal ulcer of left eye; and Status post bilateral breast reconstruction on their problem list.  This is a very nice patient with recent drainage, irritation, redness of the left eye with a lowered left  lid whose been treated for eye infections with antibiotics, steroids, without relief.  Today her left eye appears to have drainage, slight redness, left eyelid droop, pupils are round and reactive to light and equal, but her exam shows reduced vision in the left upper outer quadrant.  She also has a history of mouth and genital painful sore, inflamed eye, recent rashes was a biopsy consistent with vasculitic process, joint swelling, given constellation and autoimmune history and wondering if this could be Behcet's disease which is a chronic inflammatory disorder that affects multiple parts of the body including the blood vessel eyes skin joints and GI tract.    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   MRI orbits/Brain without etiology CTA/MRA/MVV unremarkable Thorough bloodwork completed  Referral to [facility]  Recent Results (from the past 2160 hours)  Sedimentation rate     Status: None   Collection Time: 10/29/23  9:44 AM  Result Value Ref Range   Sed Rate 3 0 - 40 mm/hr  C-reactive protein     Status: None   Collection Time: 10/29/23  9:44 AM  Result Value Ref Range   CRP 2 0 - 10 mg/L  Thyroglobulin antibody     Status: None   Collection Time: 10/29/23  9:44 AM  Result Value Ref Range   Thyroglobulin Antibody <1.0 0.0 - 0.9 IU/mL    Comment: Thyroglobulin Antibody measured by [employer] Methodology It should be noted that the presence of thyroglobulin antibodies may not be pathogenic nor diagnostic, especially at very low levels. The assay manufacturer has found that four percent of individuals without evidence of thyroid  disease or autoimmunity will have positive TgAb levels up to 4 IU/mL.   Thyroid  peroxidase antibody     Status: None   Collection Time: 10/29/23  9:44 AM  Result Value Ref Range   Thyroperoxidase Ab SerPl-aCnc <9 0 - 34 IU/mL  Factor V  Leiden     Status: None   Collection Time: 10/29/23  9:44 AM  Result Value Ref Range   Factor V Leiden Result Comment     Comment: G-G (Normal-Normal) No factor V Leiden mutation present.    Interpretation: Comment     Comment: While the patient does not possess  this risk factor, other thrombotic risk factors may be detected through systematic clinical laboratory analysis.    Methodology: Comment     Comment: Patient DNA was evaluated for the factor V Leiden mutation at nucleotide 1691 using allele specific PCR technology followed by gel electrophoresis.    Comments: Comment     Comment: Simultaneous Risks: If a patient possesses two or more congenital or acquired thrombophilic risk factors, the risk of thrombosis may rise to more than the sum of the risk ratios for the individual risk factors. For instance, a combination of the prothrombin G20210A mutation and the factor V Leiden mutation may confer an increase in thrombotic risk in the range of 20-30 fold.  Recommendations for Genetic Counseling: The factor V Leiden mutation is an inherited characteristic. If the mutation is present, we recommend that the patient and their family consider genetic counseling to obtain additional information on inheritance and to identify other family members at risk.  Testing Characteristics: Genetic testing provides exceptionally high sensitivity and specificity. Inaccurate results are limited to rare polymorphisms in primer binding sites and to misidentification of specimens by collectors or laboratory personnel. This assay detects only the factor V Leiden mutati on and does not detect other genetic abnormalities.  This test was developed and its performance characteristics determined by [facility]. It has not been cleared or approved by the Food and Drug Administration.  References: Bertina RM, et al. Kerri Fish. [phone]. [place] and [place] Mol.Diagn.  2001;6(3):201. Emmerich J, et al. Kerri Fish. 7998;13:190-83. Rinteke C, et al. [place]. [phone].   Myasthenia Gravis Profile     Status: None   Collection Time: 10/29/23  9:44 AM  Result Value Ref Range   AChR Binding Ab, Serum <0.03 0.00 - 0.24 nmol/L    Comment:                                Negative:   0.00 - 0.24                                Borderline: 0.25 - 0.40                                Positive:         >0.40    Acetylchol Block Ab 17 0 - 25 %    Comment:                                Negative:      0 - 25                                Borderline:   26 - 30                                Positive:         >30    AChR-modulating Ab 0 0 - 45 %    Comment:                        Interpretive Information:  Negative:             0 - 45%                         Positive:               > 45% No single value for AChR-modulating antibody should be used as a sole basis for diagnosis or response to therapy.    Anti-striation Abs Negative Neg:<1:100   Reflex Information Comment     Comment: Reflex test indicated.  MuSK Antibodies     Status: None   Collection Time: 10/29/23  9:44 AM  Result Value Ref Range   MuSK Antibodies <1.0 U/mL    Comment:  Reference Range:   Negative: <1.0   Positive: 1.0 or higher  This test was developed and its performance characteristics determined by [facility]. It has not been cleared or approved by the Food and Drug Administration.   CBC with Differential/Platelets     Status: Abnormal   Collection Time: 10/29/23  9:44 AM  Result Value Ref Range   WBC 5.1 3.4 - 10.8 x10E3/uL    Comment: **Effective November 24, 2023 profile [id number] WBC will be made**   non-orderable as a stand-alone order code.    RBC 5.17 3.77 - 5.28 x10E6/uL   Hemoglobin 14.9 11.1 - 15.9 g/dL   Hematocrit 53.0 (H) 65.9 - 46.6 %   MCV 91 79 - 97 fL   MCH 28.8 26.6 - 33.0 pg   MCHC 31.8 31.5 - 35.7 g/dL   RDW 86.6 88.2 - 84.5  %   Platelets 279 150 - 450 x10E3/uL   Neutrophils 43 Not Estab. %   Lymphs 46 Not Estab. %   Monocytes 8 Not Estab. %   Eos 2 Not Estab. %   Basos 1 Not Estab. %   Neutrophils Absolute 2.2 1.4 - 7.0 x10E3/uL   Lymphocytes Absolute 2.3 0.7 - 3.1 x10E3/uL   Monocytes Absolute 0.4 0.1 - 0.9 x10E3/uL   EOS (ABSOLUTE) 0.1 0.0 - 0.4 x10E3/uL   Basophils Absolute 0.1 0.0 - 0.2 x10E3/uL   Immature Granulocytes 0 Not Estab. %   Immature Grans (Abs) 0.0 0.0 - 0.1 x10E3/uL  Comprehensive metabolic panel     Status: None   Collection Time: 10/29/23  9:44 AM  Result Value Ref Range   Glucose 90 70 - 99 mg/dL   BUN 11 8 - 27 mg/dL   Creatinine, Ser 9.38 0.57 - 1.00 mg/dL   eGFR 98 >40 fO/fpw/8.26   BUN/Creatinine Ratio 18 12 - 28   Sodium 139 134 - 144 mmol/L   Potassium 4.4 3.5 - 5.2 mmol/L   Chloride 100 96 - 106 mmol/L   CO2 27 20 - 29 mmol/L   Calcium  9.4 8.7 - 10.3 mg/dL   Total Protein 7.2 6.0 - 8.5 g/dL   Albumin  4.6 3.9 - 4.9 g/dL   Globulin, Total 2.6 1.5 - 4.5 g/dL   Bilirubin Total 0.4 0.0 - 1.2 mg/dL   Alkaline Phosphatase 76 44 - 121 IU/L   AST 35 0 - 40 IU/L   ALT 26 0 - 32 IU/L  Vitamin A      Status: None   Collection Time: 10/29/23  9:44 AM  Result Value Ref Range   Vitamin A  34.9 22.0 - 69.5 ug/dL    Comment: Reference intervals for vitamin A  determined from [facility] internal studies. Individuals with vitamin A  less than 20 ug/dL  are considered vitamin A  deficient and those with serum concentrations less than 10 ug/dL are considered severely deficient. This test was developed and its performance characteristics determined by [facility]. It has not been cleared or approved by the Food and Drug Administration.   B12 and Folate Panel     Status: None   Collection Time: 10/29/23  9:44 AM  Result Value Ref Range   Vitamin B-12 1,045 232 - 1,245 pg/mL   Folate >20.0 >3.0 ng/mL    Comment: A serum folate concentration of less than 3.1 ng/mL is considered to represent  clinical deficiency.   Methylmalonic acid, serum     Status: None   Collection Time: 10/29/23  9:44 AM  Result Value Ref Range   Methylmalonic Acid 217 0 - 378 nmol/L  Vitamin B1     Status: None   Collection Time: 10/29/23  9:44 AM  Result Value Ref Range   Thiamine 110.0 66.5 - 200.0 nmol/L  Vitamin E     Status: None   Collection Time: 10/29/23  9:44 AM  Result Value Ref Range   Vitamin E (Alpha Tocopherol) 18.6 9.0 - 29.0 mg/L   Vitamin E(Gamma Tocopherol) 0.5 0.5 - 4.9 mg/L    Comment: Reference intervals for alpha and gamma-tocopherol determined from [facility] and Nutrition Examination Survey, 2005-2006. Individuals with alpha-tocopherol levels less than 5.0 mg/L are considered vitamin E deficient.   TSH Rfx on Abnormal to Free T4     Status: Abnormal   Collection Time: 10/29/23  9:50 AM  Result Value Ref Range   TSH 0.227 (L) 0.450 - 4.500 uIU/mL  T4F     Status: None   Collection Time: 10/29/23  9:50 AM  Result Value Ref Range   T4,Free (Direct) 1.53 0.82 - 1.77 ng/dL       Cc: Jane Senior, MD,  Jane Senior, MD  Jane Epp, MD  [facility] [address] [place], [place] [address]  Phone [phone] Fax [phone]  I spent over 30 minutes of face-to-face and non-face-to-face time with patient on the  1. Inflammatory eye disease      diagnosis.  This included previsit chart review, lab review, study review, order entry, electronic health record documentation, patient education on the different diagnostic and therapeutic options, counseling and coordination of care, risks and benefits of management, compliance, or risk factor reduction

## 2023-12-31 ENCOUNTER — Other Ambulatory Visit (HOSPITAL_BASED_OUTPATIENT_CLINIC_OR_DEPARTMENT_OTHER): Payer: Self-pay

## 2024-01-01 ENCOUNTER — Other Ambulatory Visit: Payer: Self-pay

## 2024-01-01 ENCOUNTER — Other Ambulatory Visit (HOSPITAL_BASED_OUTPATIENT_CLINIC_OR_DEPARTMENT_OTHER): Payer: Self-pay

## 2024-01-01 MED ORDER — DEXTROAMPHETAMINE SULFATE 10 MG PO TABS
20.0000 mg | ORAL_TABLET | Freq: Two times a day (BID) | ORAL | 0 refills | Status: DC
  Filled 2024-01-01: qty 120, 30d supply, fill #0

## 2024-01-02 ENCOUNTER — Other Ambulatory Visit (HOSPITAL_BASED_OUTPATIENT_CLINIC_OR_DEPARTMENT_OTHER): Payer: Self-pay

## 2024-01-02 MED ORDER — COMIRNATY 30 MCG/0.3ML IM SUSY
PREFILLED_SYRINGE | INTRAMUSCULAR | 0 refills | Status: AC
  Filled 2024-01-02: qty 0.3, 1d supply, fill #0

## 2024-01-04 ENCOUNTER — Encounter: Payer: Self-pay | Admitting: Neurology

## 2024-01-05 DIAGNOSIS — H0288B Meibomian gland dysfunction left eye, upper and lower eyelids: Secondary | ICD-10-CM | POA: Diagnosis not present

## 2024-01-05 DIAGNOSIS — H0288A Meibomian gland dysfunction right eye, upper and lower eyelids: Secondary | ICD-10-CM | POA: Diagnosis not present

## 2024-01-05 DIAGNOSIS — H02402 Unspecified ptosis of left eyelid: Secondary | ICD-10-CM | POA: Diagnosis not present

## 2024-01-16 ENCOUNTER — Encounter: Payer: Commercial Managed Care - PPO | Admitting: Adult Health

## 2024-01-19 DIAGNOSIS — Z79899 Other long term (current) drug therapy: Secondary | ICD-10-CM | POA: Diagnosis not present

## 2024-01-19 DIAGNOSIS — M069 Rheumatoid arthritis, unspecified: Secondary | ICD-10-CM | POA: Diagnosis not present

## 2024-01-20 ENCOUNTER — Ambulatory Visit (INDEPENDENT_AMBULATORY_CARE_PROVIDER_SITE_OTHER): Payer: Self-pay | Admitting: Plastic Surgery

## 2024-01-20 ENCOUNTER — Encounter: Payer: Self-pay | Admitting: Neurology

## 2024-01-20 DIAGNOSIS — Z719 Counseling, unspecified: Secondary | ICD-10-CM

## 2024-01-20 NOTE — Progress Notes (Addendum)
Preoperative Dx: hyperpigmentation of face, neck and hands  Postoperative Dx:  same  Procedure: laser to face, neck and hands   Anesthesia: none  Description of Procedure:  Risks and complications were explained to the patient. Consent was confirmed and signed. Eye protection was placed. Time out was called and all information was confirmed to be correct. The area  area was prepped with alcohol and wiped dry. The Heroic BBL laser was set at 532 nm and presetting. The face, neck and hands were lasered. The patient tolerated the procedure well and there were no complications. The patient is to follow up in 4 weeks.

## 2024-01-26 ENCOUNTER — Other Ambulatory Visit (HOSPITAL_BASED_OUTPATIENT_CLINIC_OR_DEPARTMENT_OTHER): Payer: Self-pay

## 2024-01-26 ENCOUNTER — Telehealth: Payer: Self-pay | Admitting: Neurology

## 2024-01-26 ENCOUNTER — Other Ambulatory Visit: Payer: Self-pay | Admitting: Neurology

## 2024-01-26 DIAGNOSIS — M352 Behcet's disease: Secondary | ICD-10-CM

## 2024-01-26 DIAGNOSIS — E041 Nontoxic single thyroid nodule: Secondary | ICD-10-CM | POA: Diagnosis not present

## 2024-01-26 DIAGNOSIS — E785 Hyperlipidemia, unspecified: Secondary | ICD-10-CM | POA: Diagnosis not present

## 2024-01-26 DIAGNOSIS — E039 Hypothyroidism, unspecified: Secondary | ICD-10-CM | POA: Diagnosis not present

## 2024-01-26 LAB — LAB REPORT - SCANNED: EGFR: 120.7

## 2024-01-26 MED ORDER — AREXVY 120 MCG/0.5ML IM SUSR
0.5000 mL | Freq: Once | INTRAMUSCULAR | 0 refills | Status: AC
  Filled 2024-01-26: qty 0.5, 1d supply, fill #0

## 2024-01-26 NOTE — Telephone Encounter (Signed)
Can you ask for notes and blood work results from Rhaumatology for patient for the last 6 months and also Dr groat's last note and give it to [facility] please for a referral thank you

## 2024-01-26 NOTE — Telephone Encounter (Signed)
Referral for neurology fax to [facility]. Fax: [phone]

## 2024-01-26 NOTE — Telephone Encounter (Signed)
Need youhelp for this refferall I'll come talk to you  Fax to   They said it might have gone to the wrong place e in the system.  They said it could be re faxed and their number is  [phone]  Or it could be emailed to [email or web address]    Thank you  Jane Gutierrez DOB 22-Jul-1956    Will need to send:  - my notes - results of imaging - last 6 months of rheumatology notes and their labs - last 6 months of my labs - Dr. Dione Booze visit   Lets discuss

## 2024-01-27 ENCOUNTER — Other Ambulatory Visit (HOSPITAL_BASED_OUTPATIENT_CLINIC_OR_DEPARTMENT_OTHER): Payer: Self-pay

## 2024-01-27 DIAGNOSIS — H10022 Other mucopurulent conjunctivitis, left eye: Secondary | ICD-10-CM | POA: Diagnosis not present

## 2024-01-27 MED ORDER — POLYMYXIN B-TRIMETHOPRIM 10000-0.1 UNIT/ML-% OP SOLN
1.0000 [drp] | Freq: Four times a day (QID) | OPHTHALMIC | 0 refills | Status: DC
  Filled 2024-01-27: qty 10, 50d supply, fill #0

## 2024-01-29 NOTE — Telephone Encounter (Signed)
 Can we send in the referral with whatever notes we have already? Abran Abrahams can you check if we have gotten any of these in and give to annie to send thanks (and let patient know when referral completed)

## 2024-01-29 NOTE — Telephone Encounter (Signed)
 Refax referral to [facility]. Phone: [phone] attached notes from [facility]

## 2024-01-30 ENCOUNTER — Other Ambulatory Visit (HOSPITAL_COMMUNITY): Payer: Self-pay

## 2024-01-30 NOTE — Telephone Encounter (Signed)
Thank you please let patient know

## 2024-02-01 DIAGNOSIS — Z1212 Encounter for screening for malignant neoplasm of rectum: Secondary | ICD-10-CM | POA: Diagnosis not present

## 2024-02-02 DIAGNOSIS — Z0000 Encounter for general adult medical examination without abnormal findings: Secondary | ICD-10-CM | POA: Diagnosis not present

## 2024-02-02 DIAGNOSIS — I6523 Occlusion and stenosis of bilateral carotid arteries: Secondary | ICD-10-CM | POA: Diagnosis not present

## 2024-02-02 DIAGNOSIS — M352 Behcet's disease: Secondary | ICD-10-CM | POA: Diagnosis not present

## 2024-02-02 DIAGNOSIS — R931 Abnormal findings on diagnostic imaging of heart and coronary circulation: Secondary | ICD-10-CM | POA: Diagnosis not present

## 2024-02-02 DIAGNOSIS — F900 Attention-deficit hyperactivity disorder, predominantly inattentive type: Secondary | ICD-10-CM | POA: Diagnosis not present

## 2024-02-02 DIAGNOSIS — I7300 Raynaud's syndrome without gangrene: Secondary | ICD-10-CM | POA: Diagnosis not present

## 2024-02-02 DIAGNOSIS — Z1339 Encounter for screening examination for other mental health and behavioral disorders: Secondary | ICD-10-CM | POA: Diagnosis not present

## 2024-02-02 DIAGNOSIS — Z8541 Personal history of malignant neoplasm of cervix uteri: Secondary | ICD-10-CM | POA: Diagnosis not present

## 2024-02-02 DIAGNOSIS — Z853 Personal history of malignant neoplasm of breast: Secondary | ICD-10-CM | POA: Diagnosis not present

## 2024-02-02 DIAGNOSIS — R82998 Other abnormal findings in urine: Secondary | ICD-10-CM | POA: Diagnosis not present

## 2024-02-02 DIAGNOSIS — E785 Hyperlipidemia, unspecified: Secondary | ICD-10-CM | POA: Diagnosis not present

## 2024-02-02 DIAGNOSIS — E041 Nontoxic single thyroid nodule: Secondary | ICD-10-CM | POA: Diagnosis not present

## 2024-02-02 DIAGNOSIS — E039 Hypothyroidism, unspecified: Secondary | ICD-10-CM | POA: Diagnosis not present

## 2024-02-02 DIAGNOSIS — Z1331 Encounter for screening for depression: Secondary | ICD-10-CM | POA: Diagnosis not present

## 2024-02-02 DIAGNOSIS — I700 Atherosclerosis of aorta: Secondary | ICD-10-CM | POA: Diagnosis not present

## 2024-02-03 DIAGNOSIS — H10022 Other mucopurulent conjunctivitis, left eye: Secondary | ICD-10-CM | POA: Diagnosis not present

## 2024-02-04 ENCOUNTER — Other Ambulatory Visit (HOSPITAL_BASED_OUTPATIENT_CLINIC_OR_DEPARTMENT_OTHER): Payer: Self-pay

## 2024-02-04 MED ORDER — ROSUVASTATIN CALCIUM 5 MG PO TABS
5.0000 mg | ORAL_TABLET | ORAL | 12 refills | Status: AC
  Filled 2024-02-04: qty 32, 84d supply, fill #0
  Filled 2024-02-27 – 2024-04-25 (×3): qty 32, 84d supply, fill #1
  Filled 2024-05-25 – 2024-07-24 (×3): qty 32, 84d supply, fill #2
  Filled 2024-09-10 – 2024-11-08 (×2): qty 32, 84d supply, fill #3
  Filled ????-??-??: fill #4
  Filled ????-??-??: fill #3

## 2024-02-04 MED ORDER — ALBUTEROL SULFATE HFA 108 (90 BASE) MCG/ACT IN AERS
2.0000 | INHALATION_SPRAY | Freq: Four times a day (QID) | RESPIRATORY_TRACT | 11 refills | Status: AC | PRN
  Filled 2024-02-04: qty 6.7, 25d supply, fill #0
  Filled 2024-02-27: qty 6.7, 25d supply, fill #1
  Filled 2024-04-25: qty 6.7, 25d supply, fill #2
  Filled 2024-05-25: qty 6.7, 25d supply, fill #3
  Filled 2024-06-22: qty 6.7, 25d supply, fill #4
  Filled 2024-07-24: qty 6.7, 25d supply, fill #5
  Filled 2024-09-12: qty 6.7, 25d supply, fill #6
  Filled 2024-11-08: qty 6.7, 25d supply, fill #7
  Filled 2025-01-17: qty 6.7, 25d supply, fill #8

## 2024-02-13 ENCOUNTER — Encounter: Payer: Self-pay | Admitting: Adult Health

## 2024-02-13 ENCOUNTER — Inpatient Hospital Stay: Payer: Medicare HMO | Attending: Adult Health | Admitting: Adult Health

## 2024-02-13 VITALS — BP 115/68 | HR 89 | Temp 97.6°F | Resp 16 | Wt 143.7 lb

## 2024-02-13 DIAGNOSIS — C50412 Malignant neoplasm of upper-outer quadrant of left female breast: Secondary | ICD-10-CM | POA: Insufficient documentation

## 2024-02-13 DIAGNOSIS — Z170 Estrogen receptor positive status [ER+]: Secondary | ICD-10-CM | POA: Insufficient documentation

## 2024-02-13 NOTE — Progress Notes (Signed)
 [facility] Cancer Follow up:    Adrian Prince, MD [address] [place] [address]   DIAGNOSIS:  Cancer Staging  Breast cancer of upper-outer quadrant of left female breast [facility]) Staging form: Breast, AJCC 7th Edition - Clinical: Stage 0 (Tis (DCIS), N0, M0) - Unsigned Laterality: Left - Pathologic: Stage IIA (T2, N0, cM0) - Unsigned Laterality: Left - Clinical: Stage IA (T1b, N0, M0) - Unsigned Laterality: Right - Pathologic: T0 - Unsigned Laterality: Right   SUMMARY OF ONCOLOGIC HISTORY: Oncology History  Breast cancer, left breast (HCC) (Resolved)   Initial Diagnosis   Breast cancer, left breast   12/04/2012 Initial Biopsy   Left breast needle core biopsy (UOQ): Grade 3, DCIS with necrosis and calcs. ER+ (100%), PR+ (86%).    Breast cancer of upper-outer quadrant of left female breast (HCC)  12/04/2012 Initial Biopsy   LEFT breast needle core biopsy (UOQ): Grade 3, DCIS with necrosis and calcs. ER+ (100%), PR+ (86%).    12/14/2012 Breast MRI   LEFT breast: Clumped nodular enhancement measuring 7.2 x 3.2 x 2.6 cm. Also associated post-biopsy change. RIGHT breast: At 12 o'clock location, there is 6mm slightly irregular mass-like area of abnml enhancement. No lymphadenopathy.    12/25/2012 Initial Biopsy   RIGHT breast needle core biopsy (upper centeral): Grade 1-2, IDC, DCIS with calcs. ER+ (100%), PR+ (100%), HER2- (ratio 1.23). Ki67 4%.    12/25/2012 Initial Diagnosis   Bilateral breast cancer   12/31/2012 Surgery   Bilateral mastectomies with bilat SLNB Dwain Sarna). LEFT: Grade 2, IDC, 2.3 cm. (+) LVI. Grade 3 DCIS w/comedonecrosis & calcs. (-) margins. 1 left ax SLN neg.  ER+ (99%), PR+ (10%), HER2 repeated and (+) ratio (6.81). Ki67 46%. RIGHT: Benign, 1 SLN neg.   12/31/2012 Pathologic Stage   LEFT breast: pT2, pN0, pMx. Stage IIA.    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 EF 55-60%   02/04/2013 - 05/13/2013 Adjuvant Chemotherapy   Taxotere/Carbo/Herceptin q 3 wks x 5 cycles completed. (planned for 6 cycles but d/c'd early due to pt wishes and recent infection).    05/2013 -  Anti-estrogen oral therapy   Attempted to take Letrozole, Aromasin, & Tamoxifen but patient could not tolerate any anti-estrogen therapy due to severe depressive symptoms and joint/muscle aches & pains.  No longer taking anti-estrogen therapy.   09/29/2013 Surgery   LEFT breast reconstruction with lat flap and placement of tissue expander ([facility]anger).    11/26/2013 Imaging   CT chest: No definite CT findings for chest wall recurrence and no suspicious supraclavicular or axillary adenopathy.  Negative for metastatic disease in lungs or bones.     - 02/16/2014 Adjuvant Chemotherapy   Herceptin maintenance x 1 year completed.    03/09/2014 Surgery   Removal of bilateral tissue expanders and placement of bilateral breast implants (Sanger).    03/09/2014 Procedure   Right scar biopsy: No malignancy & Left breast capsule biopsy: No evidence of malignancy.    08/22/2014 Echocardiogram   Post-treatment. EF 55-60%   06/30/2015 Survivorship   Survivorship Care Plan given to patient and reviewed with her during in-person visit.      CURRENT THERAPY: observation  INTERVAL HISTORY:  Discussed the use of AI scribe software for clinical note transcription with the patient, who gave verbal consent to proceed.  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  Initially, the eye was described as "flattened out and lower," leading to a suspicion of monoclonal gravis. However, after seeing a cornea specialist, retina specialist, and a doctor for possible monoclonal gravis, all tests have returned negative. The patient has been referred to a national [facility] in [place] for possible Behcet's disease, but has not heard back from  her yet. The patient's eye is now starting to cloud up, but no definitive diagnosis has been made.  The patient also has a history of breast cancer, for which she underwent bilateral mastectomies and completed treatment in 2015. She has been on observation since then. She denies any concerns today and has no breast changes.     Patient Active Problem List   Diagnosis Date Noted   Corneal ulcer of left eye 07/08/2023   Gastroesophageal reflux disease 01/15/2023   Constipation 01/15/2023   Breast pain 05/28/2022   Encounter for counseling 10/16/2021   Hardening of the aorta (main artery of the heart) (HCC) 03/02/2021   Genital herpes simplex 02/22/2021   Family history of diseases of the blood and blood-forming organs and certain disorders involving the immune mechanism 10/21/2019   Polyneuropathy 10/21/2019   Rash and other nonspecific skin eruption 10/21/2019   Raynaud's disease 01/12/2019   Osteoarthritis 11/12/2018   Cough 10/27/2017   Pain in right hand 09/02/2017   Chronic sinusitis 08/18/2017   Wheezing 08/18/2017   Psychophysiologic insomnia 01/06/2017   Pain in left elbow 01/02/2016   Dyspnea 09/11/2015   Abnormal weight loss 08/17/2015   Alopecia 08/17/2015   Fatigue 08/17/2015   Joint pain 08/17/2015   Breast cancer of upper-outer quadrant of left female breast (HCC) 06/28/2015   Nontoxic single thyroid nodule 12/28/2014   Right hip pain 09/19/2014   Right knee pain 09/19/2014   Dyspnea on exertion 08/08/2014   Heart palpitations 08/08/2014   Basal cell carcinoma 06/14/2014   Status post bilateral breast reconstruction 03/16/2014   Acquired absence of breast and absent nipple, bilateral 03/09/2014   Myopathy 02/01/2014   Localized swelling, mass and lump, trunk 11/22/2013   Pre-operative cardiovascular examination 09/09/2013   Other chronic pulmonary heart diseases 05/27/2013   SIRS (systemic inflammatory response syndrome) (HCC) 04/14/2013   Anemia 04/14/2013    Dehydration 04/14/2013   Fever 04/14/2013   IBS (irritable bowel syndrome)    Lyme disease    Asthma    Hypothyroidism    Contact lens/glasses fitting    Hyperlipidemia 11/16/2012   Headache 02/05/2012   Benign neoplasm of colon 09/17/2011   Migraine without aura, not refractory 09/17/2011   Malignant neoplasm of cervix uteri (HCC) 09/06/2009   Personal history of other specified conditions 09/06/2009   Thrombophlebitis of superficial veins of lower extremity 09/06/2009   Varicose veins of other specified sites 09/06/2009    is allergic to betadine [povidone iodine], doxycycline, iodine, shellfish-derived products, gluten meal, omeprazole, povidone-iodine, rosuvastatin calcium, amoxicillin, ginger, penicillins, and sulfamethoxazole-trimethoprim.  MEDICAL HISTORY: Past Medical History:  Diagnosis Date   Abnormally small mouth    ADD (attention deficit disorder)    ADHD    Allergy    Anxiety    pt reported she doesn't have anxiety   Asthma    prn inhaler   Blood transfusion without reported diagnosis    Breast cancer (HCC)    Dental crowns present    Difficulty swallowing pills    History of breast cancer    History of cervical cancer    History of palpitations  last echo 08/22/2014   Hypothyroidism    IBS (irritable bowel syndrome)    Vasculitis (HCC)    Dr. Terri Piedra last year dx    SURGICAL HISTORY: Past Surgical History:  Procedure Laterality Date   ABDOMINAL HYSTERECTOMY     ABDOMINOPLASTY  03/28/2004   AXILLARY SENTINEL NODE BIOPSY  12/31/2012   Procedure: AXILLARY SENTINEL NODE BIOPSY;  Surgeon: Emelia Loron, MD;  Location: [facility];  Service: General;  Laterality: Bilateral;  bilateral sentinel node   BRAIN SURGERY     removal benign frontal lobe cyst in 1987   BREAST ENHANCEMENT SURGERY Bilateral ~ 2008   BREAST IMPLANT EXCHANGE Bilateral 12/11/2017   Procedure: BILATERAL IMPLANT EXCHANGE;  Surgeon: Peggye Form, DO;  Location:  [facility];  Service: Plastics;  Laterality: Bilateral;   BREAST RECONSTRUCTION WITH PLACEMENT OF TISSUE EXPANDER AND FLEX HD (ACELLULAR HYDRATED DERMIS)  12/31/2012   Procedure: BREAST RECONSTRUCTION WITH PLACEMENT OF TISSUE EXPANDER AND FLEX HD (ACELLULAR HYDRATED DERMIS);  Surgeon: Wayland Denis, DO;  Location: [facility];  Service: Plastics;  Laterality: Bilateral;  bilateral immediate breast reconstruction with expanders and flex hd    CAPSULOTOMY Left 09/01/2014   Procedure: CAPSULOTOMY OF LEFT BREAST WITH LIPOFILLING FOR SYMMETRY;  Surgeon: Wayland Denis, DO;  Location: [facility];  Service: Plastics;  Laterality: Left;   CERVICAL CERCLAGE     DIAGNOSTIC LAPAROSCOPY  05/12/2003   right partial salpingectomy; lysis paraovarian adhesions left   DILATION AND CURETTAGE OF UTERUS  2005   ENDOMETRIAL FULGURATION  05/12/2003   FACIAL COSMETIC SURGERY  ~ 2010   FOOT SURGERY Bilateral 07/2012   GYNECOLOGIC CRYOSURGERY  1998   INCISION AND DRAINAGE OF WOUND Left 04/28/2013   Procedure: IRRIGATION AND DEBRIDEMENT LEFT BREAST WOUND, POSSIBLE CLOSURE OF WOUND WITH DRAIN PLACEMENT;  Surgeon: Wayland Denis, DO;  Location: [facility];  Service: Plastics;  Laterality: Left;   LATISSIMUS FLAP TO BREAST Left 09/29/2013   Procedure: LATISSIMUS FLAP TO BREAST WITH TISSUE EXPANDER PLACEMENT FOR LEFT BREAST RECONSTRUCTION;  Surgeon: Wayland Denis, DO;  Location: [facility];  Service: Plastics;  Laterality: Left;   LIPECTOMY Bilateral 03/28/2004   hip   LIPOSUCTION Bilateral 03/09/2014   Procedure: LIPOSUCTION ABDOMEN W/ LIPOFILLING LATERAL BILATERAL BREAST;  Surgeon: Wayland Denis, DO;  Location: [facility];  Service: Plastics;  Laterality: Bilateral;  LIPOSUCTION NECK   LIPOSUCTION WITH LIPOFILLING Bilateral 09/01/2014   Procedure: LIPOSUCTION WITH LIPOFILLING;  Surgeon: Wayland Denis, DO;  Location: [facility];  Service: Plastics;   Laterality: Bilateral;   LYSIS OF ADHESION  03/28/2004   pelvic   MASTECTOMY     PORT-A-CATH REMOVAL Right 03/09/2014   Procedure: REMOVAL PORT-A-CATH;  Surgeon: Wayland Denis, DO;  Location: [facility];  Service: Plastics;  Laterality: Right;   PORTACATH PLACEMENT  01/27/2013   Procedure: INSERTION PORT-A-CATH;  Surgeon: Emelia Loron, MD;  Location: [facility];  Service: General;  Laterality: Right;   RADIOFREQUENCY ABLATION NERVES  05/12/2003   uterosacral nerve ablation   REMOVAL OF BILATERAL TISSUE EXPANDERS WITH PLACEMENT OF BILATERAL BREAST IMPLANTS Bilateral 03/09/2014   Procedure: REMOVAL OF BILATERAL TISSUE EXPANDERS WITH PLACEMENT OF BILATERAL BREAST IMPLANTS;  Surgeon: Wayland Denis, DO;  Location:  SURGERY CENTER;  Service: Plastics;  Laterality: Bilateral;   TEE WITHOUT CARDIOVERSION N/A 06/04/2013   Procedure: TRANSESOPHAGEAL ECHOCARDIOGRAM (TEE);  Surgeon: Dolores Patty, MD;  Location[facility];  Service: Cardiovascular;  Laterality: N/A;   TISSUE EXPANDER PLACEMENT Left 06/16/2013   Procedure: LEFT BREAST TISSUE EXPANDER REMOVAL;  Surgeon: Wayland Denis, DO;  Location: [facility];  Service: Plastics;  Laterality: Left;   TISSUE EXPANDER PLACEMENT Left 09/29/2013   Procedure: TISSUE EXPANDER;  Surgeon: Wayland Denis, DO;  Location: [facility];  Service: Plastics;  Laterality: Left;   TOTAL ABDOMINAL HYSTERECTOMY W/ BILATERAL SALPINGOOPHORECTOMY  03/28/2004   TOTAL MASTECTOMY  12/31/2012   Procedure: TOTAL MASTECTOMY;  Surgeon: Emelia Loron, MD;  Location: [facility];  Service: General;  Laterality: Bilateral;  bilateral total mastectomies    SOCIAL HISTORY: Social History   Socioeconomic History   Marital status: Married    Spouse name: Not on file   Number of children: 1   Years of education: Not on file   Highest education level: Not on file  Occupational History   Not on file  Tobacco Use    Smoking status: Never   Smokeless tobacco: Never  Vaping Use   Vaping status: Never Used  Substance and Sexual Activity   Alcohol use: Not Currently   Drug use: No   Sexual activity: Yes  Other Topics Concern   Not on file  Social History Narrative   Not on file   Social Drivers of Health   Financial Resource Strain: Not on file  Food Insecurity: Not on file  Transportation Needs: Not on file  Physical Activity: Not on file  Stress: Not on file  Social Connections: Unknown (05/07/2022)   Received from [facility]   Social Network    Social Network: Not on file  Intimate Partner Violence: Unknown (03/29/2022)   Received from [facility], [facility]   HITS    Physically Hurt: Not on file    Insult or Talk Down To: Not on file    Threaten Physical Harm: Not on file    Scream or Curse: Not on file    FAMILY HISTORY: Family History  Problem Relation Age of Onset   Cancer Mother 68       uterine, mult myeloma   CVA Father    Cancer Sister 25       breast   Leukemia Sister        CLL   Cancer Maternal Grandmother 62       breast   Celiac disease Daughter    Migraines Daughter     Review of Systems  Constitutional:  Negative for appetite change, chills, fatigue, fever and unexpected weight change.  HENT:   Negative for hearing loss, lump/mass and trouble swallowing.   Eyes:  Negative for eye problems and icterus.  Respiratory:  Negative for chest tightness, cough and shortness of breath.   Cardiovascular:  Negative for chest pain, leg swelling and palpitations.  Gastrointestinal:  Negative for abdominal distention, abdominal pain, constipation, diarrhea, nausea and vomiting.  Endocrine: Negative for hot flashes.  Genitourinary:  Negative for difficulty urinating.   Musculoskeletal:  Negative for arthralgias.  Skin:  Negative for itching and rash.  Neurological:  Negative for dizziness, extremity weakness, headaches and numbness.  Hematological:   Negative for adenopathy. Does not bruise/bleed easily.  Psychiatric/Behavioral:  Negative for depression. The patient is not nervous/anxious.       PHYSICAL EXAMINATION    Vitals:   02/13/24 1147  BP: 115/68  Pulse: 89  Resp: 16  Temp: 97.6 F (36.4 C)  SpO2: 99%    Physical Exam Constitutional:  General: She is not in acute distress.    Appearance: Normal appearance. She is not toxic-appearing.  HENT:     Head: Normocephalic and atraumatic.     Mouth/Throat:     Mouth: Mucous membranes are moist.     Pharynx: Oropharynx is clear. No oropharyngeal exudate or posterior oropharyngeal erythema.  Eyes:     General: No scleral icterus. Cardiovascular:     Rate and Rhythm: Normal rate and regular rhythm.     Pulses: Normal pulses.     Heart sounds: Normal heart sounds.  Pulmonary:     Effort: Pulmonary effort is normal.     Breath sounds: Normal breath sounds.  Chest:     Comments: S/p bilateral mastectomies and reconstruction, no sign of local recurrence Abdominal:     General: Abdomen is flat. Bowel sounds are normal. There is no distension.     Palpations: Abdomen is soft.     Tenderness: There is no abdominal tenderness.  Musculoskeletal:        General: No swelling.     Cervical back: Neck supple.  Lymphadenopathy:     Cervical: No cervical adenopathy.     Upper Body:     Right upper body: No supraclavicular or axillary adenopathy.     Left upper body: No supraclavicular or axillary adenopathy.  Skin:    General: Skin is warm and dry.     Findings: No rash.  Neurological:     General: No focal deficit present.     Mental Status: She is alert.  Psychiatric:        Mood and Affect: Mood normal.        Behavior: Behavior normal.      ASSESSMENT and THERAPY PLAN:   Breast cancer of upper-outer quadrant of left female breast (HCC) Mairlyn is a 68 year old woman with history of stage IIa left-sided triple positive breast cancer, and stage Ia right-sided  ER/PR positive breast cancer.  She was diagnosed in January 2014 and she is status post bilateral mastectomies followed by adjuvant chemotherapy maintenance Herceptin therapy and bilateral breast reconstruction.  She was unable to tolerate antiestrogen therapy.  Post Bilateral Mastectomies for Breast Cancer Patient is 10 years post bilateral mastectomies and treatment for HER2 positive breast cancer. No signs of recurrence on examination. -Continue annual follow-up or sooner if any concerns arise.  Possible Sjogren's or Behcet's disease Patient reports left eye abnormality, currently under evaluation by multiple specialists. No definitive diagnosis yet. -Continue current evaluation and follow-up with [facility] in [place] as referred by Dr. Lucia Gaskins.  Hypertension -Continue f/u with PCP   RTC in 1 year for continued long-term surveillance.   All questions were answered. The patient knows to call the clinic with any problems, questions or concerns. We can certainly see the patient much sooner if necessary.  Total encounter time:20 minutes*in face-to-face visit time, chart review, lab review, care coordination, order entry, and documentation of the encounter time.    Lillard Anes, NP 02/17/24 12:45 PM Medical Oncology and Hematology [facility] [address] [place], [place] [address] Tel. [phone]    Fax. [phone]  *Total Encounter Time as defined by the Centers for Medicare and Medicaid Services includes, in addition to the face-to-face time of a patient visit (documented in the note above) non-face-to-face time: obtaining and reviewing outside history, ordering and reviewing medications, tests or procedures, care coordination (communications with other health care professionals or caregivers) and documentation in the medical record.

## 2024-02-17 NOTE — Assessment & Plan Note (Signed)
 Jane Gutierrez is a 68 year old woman with history of stage IIa left-sided triple positive breast cancer, and stage Ia right-sided ER/PR positive breast cancer.  She was diagnosed in January 2014 and she is status post bilateral mastectomies followed by adjuvant chemotherapy maintenance Herceptin therapy and bilateral breast reconstruction.  She was unable to tolerate antiestrogen therapy.  Post Bilateral Mastectomies for Breast Cancer Patient is 10 years post bilateral mastectomies and treatment for HER2 positive breast cancer. No signs of recurrence on examination. -Continue annual follow-up or sooner if any concerns arise.  Possible Sjogren's or Behcet's disease Patient reports left eye abnormality, currently under evaluation by multiple specialists. No definitive diagnosis yet. -Continue current evaluation and follow-up with [facility] in [place] as referred by Dr. Lucia Gaskins.  Hypertension -Continue f/u with PCP   RTC in 1 year for continued long-term surveillance.

## 2024-02-19 ENCOUNTER — Other Ambulatory Visit (HOSPITAL_COMMUNITY): Payer: Self-pay

## 2024-02-19 ENCOUNTER — Other Ambulatory Visit (HOSPITAL_BASED_OUTPATIENT_CLINIC_OR_DEPARTMENT_OTHER): Payer: Self-pay

## 2024-02-19 DIAGNOSIS — H10022 Other mucopurulent conjunctivitis, left eye: Secondary | ICD-10-CM | POA: Diagnosis not present

## 2024-02-19 MED ORDER — AZITHROMYCIN 250 MG PO TABS
250.0000 mg | ORAL_TABLET | Freq: Every day | ORAL | 0 refills | Status: DC
  Filled 2024-02-19: qty 30, 30d supply, fill #0

## 2024-02-19 MED ORDER — AZASITE 1 % OP SOLN
1.0000 [drp] | Freq: Every day | OPHTHALMIC | 1 refills | Status: DC
  Filled 2024-02-19: qty 2.5, 14d supply, fill #0
  Filled 2024-02-19 (×2): qty 2.5, 50d supply, fill #0

## 2024-02-20 ENCOUNTER — Other Ambulatory Visit (HOSPITAL_BASED_OUTPATIENT_CLINIC_OR_DEPARTMENT_OTHER): Payer: Self-pay

## 2024-02-20 ENCOUNTER — Other Ambulatory Visit (HOSPITAL_COMMUNITY): Payer: Self-pay

## 2024-02-27 ENCOUNTER — Other Ambulatory Visit (HOSPITAL_BASED_OUTPATIENT_CLINIC_OR_DEPARTMENT_OTHER): Payer: Self-pay

## 2024-02-27 ENCOUNTER — Encounter: Payer: Self-pay | Admitting: Plastic Surgery

## 2024-02-27 ENCOUNTER — Ambulatory Visit (INDEPENDENT_AMBULATORY_CARE_PROVIDER_SITE_OTHER): Payer: Self-pay | Admitting: Plastic Surgery

## 2024-02-27 DIAGNOSIS — Z719 Counseling, unspecified: Secondary | ICD-10-CM

## 2024-02-27 NOTE — Progress Notes (Signed)

## 2024-02-28 ENCOUNTER — Other Ambulatory Visit (HOSPITAL_BASED_OUTPATIENT_CLINIC_OR_DEPARTMENT_OTHER): Payer: Self-pay

## 2024-03-01 ENCOUNTER — Other Ambulatory Visit: Payer: Self-pay

## 2024-03-03 ENCOUNTER — Other Ambulatory Visit (HOSPITAL_BASED_OUTPATIENT_CLINIC_OR_DEPARTMENT_OTHER): Payer: Self-pay

## 2024-03-03 DIAGNOSIS — R051 Acute cough: Secondary | ICD-10-CM | POA: Diagnosis not present

## 2024-03-03 DIAGNOSIS — J069 Acute upper respiratory infection, unspecified: Secondary | ICD-10-CM | POA: Diagnosis not present

## 2024-03-03 MED ORDER — CEFDINIR 300 MG PO CAPS
300.0000 mg | ORAL_CAPSULE | Freq: Two times a day (BID) | ORAL | 0 refills | Status: DC
  Filled 2024-03-03: qty 20, 10d supply, fill #0

## 2024-03-03 MED ORDER — HYDROCODONE BIT-HOMATROP MBR 5-1.5 MG/5ML PO SOLN
ORAL | 0 refills | Status: DC
  Filled 2024-03-03: qty 50, 10d supply, fill #0

## 2024-03-09 ENCOUNTER — Other Ambulatory Visit (HOSPITAL_COMMUNITY): Payer: Self-pay | Admitting: Gastroenterology

## 2024-03-09 DIAGNOSIS — R110 Nausea: Secondary | ICD-10-CM | POA: Diagnosis not present

## 2024-03-09 DIAGNOSIS — K5730 Diverticulosis of large intestine without perforation or abscess without bleeding: Secondary | ICD-10-CM | POA: Diagnosis not present

## 2024-03-09 DIAGNOSIS — R1011 Right upper quadrant pain: Secondary | ICD-10-CM

## 2024-03-10 DIAGNOSIS — H209 Unspecified iridocyclitis: Secondary | ICD-10-CM | POA: Diagnosis not present

## 2024-03-10 DIAGNOSIS — R682 Dry mouth, unspecified: Secondary | ICD-10-CM | POA: Diagnosis not present

## 2024-03-10 DIAGNOSIS — H04123 Dry eye syndrome of bilateral lacrimal glands: Secondary | ICD-10-CM | POA: Diagnosis not present

## 2024-03-10 DIAGNOSIS — K121 Other forms of stomatitis: Secondary | ICD-10-CM | POA: Diagnosis not present

## 2024-03-10 DIAGNOSIS — M1991 Primary osteoarthritis, unspecified site: Secondary | ICD-10-CM | POA: Diagnosis not present

## 2024-03-10 DIAGNOSIS — I7300 Raynaud's syndrome without gangrene: Secondary | ICD-10-CM | POA: Diagnosis not present

## 2024-03-10 DIAGNOSIS — Z6824 Body mass index (BMI) 24.0-24.9, adult: Secondary | ICD-10-CM | POA: Diagnosis not present

## 2024-03-10 DIAGNOSIS — L959 Vasculitis limited to the skin, unspecified: Secondary | ICD-10-CM | POA: Diagnosis not present

## 2024-03-11 DIAGNOSIS — H10022 Other mucopurulent conjunctivitis, left eye: Secondary | ICD-10-CM | POA: Diagnosis not present

## 2024-03-11 DIAGNOSIS — H10402 Unspecified chronic conjunctivitis, left eye: Secondary | ICD-10-CM | POA: Diagnosis not present

## 2024-03-12 ENCOUNTER — Other Ambulatory Visit (HOSPITAL_BASED_OUTPATIENT_CLINIC_OR_DEPARTMENT_OTHER): Payer: Self-pay

## 2024-03-15 ENCOUNTER — Other Ambulatory Visit (HOSPITAL_BASED_OUTPATIENT_CLINIC_OR_DEPARTMENT_OTHER): Payer: Self-pay

## 2024-03-15 DIAGNOSIS — H527 Unspecified disorder of refraction: Secondary | ICD-10-CM | POA: Diagnosis not present

## 2024-03-15 DIAGNOSIS — H2513 Age-related nuclear cataract, bilateral: Secondary | ICD-10-CM | POA: Diagnosis not present

## 2024-03-15 DIAGNOSIS — H0288B Meibomian gland dysfunction left eye, upper and lower eyelids: Secondary | ICD-10-CM | POA: Diagnosis not present

## 2024-03-15 DIAGNOSIS — H10402 Unspecified chronic conjunctivitis, left eye: Secondary | ICD-10-CM | POA: Diagnosis not present

## 2024-03-15 DIAGNOSIS — H0288A Meibomian gland dysfunction right eye, upper and lower eyelids: Secondary | ICD-10-CM | POA: Diagnosis not present

## 2024-03-15 MED ORDER — CEVIMELINE HCL 30 MG PO CAPS
30.0000 mg | ORAL_CAPSULE | Freq: Three times a day (TID) | ORAL | 3 refills | Status: AC
  Filled 2024-03-15: qty 90, 30d supply, fill #0

## 2024-03-15 MED ORDER — RESTASIS 0.05 % OP EMUL
1.0000 [drp] | Freq: Two times a day (BID) | OPHTHALMIC | 3 refills | Status: AC
  Filled 2024-03-15: qty 180, 90d supply, fill #0
  Filled 2024-03-18: qty 60, 30d supply, fill #0
  Filled 2024-04-25: qty 60, 30d supply, fill #1
  Filled 2025-01-17: qty 60, 30d supply, fill #2

## 2024-03-16 ENCOUNTER — Other Ambulatory Visit: Payer: Self-pay

## 2024-03-17 ENCOUNTER — Encounter (HOSPITAL_COMMUNITY)
Admission: RE | Admit: 2024-03-17 | Discharge: 2024-03-17 | Disposition: A | Discharge status: Home / Self Care | Source: Ambulatory Visit | Attending: Gastroenterology | Admitting: Gastroenterology

## 2024-03-17 ENCOUNTER — Ambulatory Visit (HOSPITAL_COMMUNITY)
Admission: RE | Admit: 2024-03-17 | Discharge: 2024-03-17 | Disposition: A | Discharge status: Home / Self Care | Source: Ambulatory Visit | Attending: Gastroenterology | Admitting: Gastroenterology

## 2024-03-17 DIAGNOSIS — R1011 Right upper quadrant pain: Secondary | ICD-10-CM | POA: Diagnosis not present

## 2024-03-17 DIAGNOSIS — R932 Abnormal findings on diagnostic imaging of liver and biliary tract: Secondary | ICD-10-CM | POA: Diagnosis not present

## 2024-03-17 DIAGNOSIS — R110 Nausea: Secondary | ICD-10-CM | POA: Diagnosis not present

## 2024-03-17 DIAGNOSIS — G8929 Other chronic pain: Secondary | ICD-10-CM | POA: Diagnosis not present

## 2024-03-17 DIAGNOSIS — K820 Obstruction of gallbladder: Secondary | ICD-10-CM | POA: Diagnosis not present

## 2024-03-17 MED ORDER — TECHNETIUM TC 99M MEBROFENIN IV KIT
5.2000 | PACK | Freq: Once | INTRAVENOUS | Status: AC
  Administered 2024-03-17: 5.2 via INTRAVENOUS

## 2024-03-18 ENCOUNTER — Other Ambulatory Visit (HOSPITAL_BASED_OUTPATIENT_CLINIC_OR_DEPARTMENT_OTHER): Payer: Self-pay

## 2024-03-18 MED ORDER — LOSARTAN POTASSIUM 50 MG PO TABS
50.0000 mg | ORAL_TABLET | Freq: Every day | ORAL | 3 refills | Status: AC
  Filled 2024-03-18: qty 90, 90d supply, fill #0
  Filled 2024-04-25 – 2024-05-25 (×2): qty 90, 90d supply, fill #1
  Filled 2024-06-22 – 2024-09-11 (×4): qty 90, 90d supply, fill #2
  Filled 2025-01-17: qty 90, 90d supply, fill #3

## 2024-03-19 ENCOUNTER — Other Ambulatory Visit (HOSPITAL_BASED_OUTPATIENT_CLINIC_OR_DEPARTMENT_OTHER): Payer: Self-pay

## 2024-04-16 ENCOUNTER — Other Ambulatory Visit (HOSPITAL_BASED_OUTPATIENT_CLINIC_OR_DEPARTMENT_OTHER): Payer: Self-pay

## 2024-04-16 DIAGNOSIS — R21 Rash and other nonspecific skin eruption: Secondary | ICD-10-CM | POA: Diagnosis not present

## 2024-04-16 DIAGNOSIS — L249 Irritant contact dermatitis, unspecified cause: Secondary | ICD-10-CM | POA: Diagnosis not present

## 2024-04-16 MED ORDER — PREDNISONE 10 MG (21) PO TBPK
ORAL_TABLET | ORAL | 0 refills | Status: DC
Start: 2024-04-16 — End: 2024-06-28
  Filled 2024-04-16: qty 21, 6d supply, fill #0

## 2024-04-16 MED ORDER — DESONIDE 0.05 % EX CREA
1.0000 | TOPICAL_CREAM | Freq: Two times a day (BID) | CUTANEOUS | 0 refills | Status: DC
Start: 2024-04-16 — End: 2024-06-28
  Filled 2024-04-16: qty 15, 30d supply, fill #0

## 2024-04-26 ENCOUNTER — Other Ambulatory Visit (HOSPITAL_BASED_OUTPATIENT_CLINIC_OR_DEPARTMENT_OTHER): Payer: Self-pay

## 2024-04-27 ENCOUNTER — Other Ambulatory Visit: Payer: Self-pay

## 2024-04-27 ENCOUNTER — Other Ambulatory Visit (HOSPITAL_BASED_OUTPATIENT_CLINIC_OR_DEPARTMENT_OTHER): Payer: Self-pay

## 2024-04-30 ENCOUNTER — Other Ambulatory Visit (HOSPITAL_COMMUNITY): Payer: Self-pay

## 2024-04-30 ENCOUNTER — Other Ambulatory Visit (HOSPITAL_BASED_OUTPATIENT_CLINIC_OR_DEPARTMENT_OTHER): Payer: Self-pay

## 2024-04-30 DIAGNOSIS — T887XXA Unspecified adverse effect of drug or medicament, initial encounter: Secondary | ICD-10-CM | POA: Diagnosis not present

## 2024-04-30 DIAGNOSIS — L501 Idiopathic urticaria: Secondary | ICD-10-CM | POA: Diagnosis not present

## 2024-04-30 MED ORDER — LEVOCETIRIZINE DIHYDROCHLORIDE 5 MG PO TABS
5.0000 mg | ORAL_TABLET | Freq: Two times a day (BID) | ORAL | 1 refills | Status: AC
  Filled 2024-04-30 (×2): qty 60, 30d supply, fill #0
  Filled 2024-05-25: qty 60, 30d supply, fill #1

## 2024-05-10 DIAGNOSIS — R1011 Right upper quadrant pain: Secondary | ICD-10-CM | POA: Diagnosis not present

## 2024-05-10 DIAGNOSIS — F909 Attention-deficit hyperactivity disorder, unspecified type: Secondary | ICD-10-CM | POA: Diagnosis not present

## 2024-05-14 ENCOUNTER — Telehealth: Payer: Self-pay | Admitting: Plastic Surgery

## 2024-05-14 ENCOUNTER — Telehealth: Payer: Self-pay

## 2024-05-14 NOTE — Telephone Encounter (Signed)
 Called patient to reschedule due to provider out of office, left voicemail to contact.

## 2024-05-14 NOTE — Telephone Encounter (Signed)
 Dr D out of office 06-08-24, lvmail to try and r/s

## 2024-05-25 ENCOUNTER — Other Ambulatory Visit (HOSPITAL_BASED_OUTPATIENT_CLINIC_OR_DEPARTMENT_OTHER): Payer: Self-pay

## 2024-05-25 MED ORDER — DEXTROAMPHETAMINE SULFATE 10 MG PO TABS
20.0000 mg | ORAL_TABLET | Freq: Two times a day (BID) | ORAL | 0 refills | Status: DC
  Filled 2024-05-25: qty 360, 90d supply, fill #0

## 2024-05-26 ENCOUNTER — Other Ambulatory Visit (HOSPITAL_BASED_OUTPATIENT_CLINIC_OR_DEPARTMENT_OTHER): Payer: Self-pay

## 2024-05-26 ENCOUNTER — Other Ambulatory Visit: Payer: Self-pay

## 2024-05-27 ENCOUNTER — Other Ambulatory Visit (HOSPITAL_BASED_OUTPATIENT_CLINIC_OR_DEPARTMENT_OTHER): Payer: Self-pay

## 2024-05-27 ENCOUNTER — Other Ambulatory Visit: Payer: Self-pay

## 2024-05-27 DIAGNOSIS — H43813 Vitreous degeneration, bilateral: Secondary | ICD-10-CM | POA: Diagnosis not present

## 2024-05-27 DIAGNOSIS — H25013 Cortical age-related cataract, bilateral: Secondary | ICD-10-CM | POA: Diagnosis not present

## 2024-05-27 DIAGNOSIS — H10022 Other mucopurulent conjunctivitis, left eye: Secondary | ICD-10-CM | POA: Diagnosis not present

## 2024-05-27 DIAGNOSIS — H04123 Dry eye syndrome of bilateral lacrimal glands: Secondary | ICD-10-CM | POA: Diagnosis not present

## 2024-06-02 ENCOUNTER — Ambulatory Visit (INDEPENDENT_AMBULATORY_CARE_PROVIDER_SITE_OTHER): Payer: Self-pay | Admitting: Plastic Surgery

## 2024-06-02 DIAGNOSIS — Z719 Counseling, unspecified: Secondary | ICD-10-CM

## 2024-06-02 NOTE — Progress Notes (Signed)

## 2024-06-08 ENCOUNTER — Encounter: Admitting: Plastic Surgery

## 2024-06-08 DIAGNOSIS — H1089 Other conjunctivitis: Secondary | ICD-10-CM | POA: Diagnosis not present

## 2024-06-09 ENCOUNTER — Ambulatory Visit: Payer: Self-pay | Admitting: Surgery

## 2024-06-09 DIAGNOSIS — Z01818 Encounter for other preprocedural examination: Secondary | ICD-10-CM

## 2024-06-09 NOTE — Progress Notes (Signed)
 Sent message, via epic in basket, requesting orders in epic from Careers adviser.

## 2024-06-14 ENCOUNTER — Other Ambulatory Visit (HOSPITAL_BASED_OUTPATIENT_CLINIC_OR_DEPARTMENT_OTHER): Payer: Self-pay

## 2024-06-14 DIAGNOSIS — L821 Other seborrheic keratosis: Secondary | ICD-10-CM | POA: Diagnosis not present

## 2024-06-14 MED ORDER — TOBRAMYCIN 0.3 % OP SOLN
OPHTHALMIC | 1 refills | Status: AC
  Filled 2024-06-14: qty 5, 21d supply, fill #0
  Filled 2024-10-15: qty 5, 21d supply, fill #1

## 2024-06-14 MED ORDER — CIPROFLOXACIN HCL 0.3 % OP SOLN
OPHTHALMIC | 1 refills | Status: DC
  Filled 2024-06-14: qty 5, 14d supply, fill #0
  Filled 2024-06-22 – 2024-10-18 (×2): qty 5, 14d supply, fill #1
  Filled 2024-10-18: qty 5, 25d supply, fill #0
  Filled 2024-11-08: qty 5, 14d supply, fill #0

## 2024-06-15 NOTE — Progress Notes (Signed)
 COVID Vaccine Completed: yes  Date of COVID positive in last 90 days:  PCP - Garnette Ore, MD Cardiologist - Shelda Bruckner, MD LOV 12/29/23  Chest x-ray - n/a EKG - 12/29/23 Epic Stress Test - n/a ECHO - 02/25/22 Epic Cardiac Cath - n/a Pacemaker/ICD device last checked: n/a Spinal Cord Stimulator: n/a  Bowel Prep - no  Sleep Study - n/a CPAP -   Fasting Blood Sugar - n/a Checks Blood Sugar _____ times a day  Last dose of GLP1 agonist-  N/A GLP1 instructions:  Do not take after     Last dose of SGLT-2 inhibitors-  N/A SGLT-2 instructions:  Do not take after     Blood Thinner Instructions:  Last dose:   Time: Aspirin Instructions: ASA 81, hold 7 days Last Dose:  Activity level: Can go up a flight of stairs and perform activities of daily living without stopping and without symptoms of chest pain or shortness of breath.   Anesthesia review: hardening or the aorta, asthma, palpitations, anemia,    Patient denies shortness of breath, fever, cough and chest pain at PAT appointment  Patient verbalized understanding of instructions that were given to them at the PAT appointment. Patient was also instructed that they will need to review over the PAT instructions again at home before surgery.

## 2024-06-15 NOTE — Patient Instructions (Addendum)
 SURGICAL WAITING ROOM VISITATION  Patients having surgery or a procedure may have no more than 2 support people in the waiting area - these visitors may rotate.    Children under the age of 30 must have an adult with them who is not the patient.  Visitors with respiratory illnesses are discouraged from visiting and should remain at home.  If the patient needs to stay at the hospital during part of their recovery, the visitor guidelines for inpatient rooms apply. Pre-op nurse will coordinate an appropriate time for 1 support person to accompany patient in pre-op.  This support person may not rotate.    Please refer to the [facility] website for the visitor guidelines for Inpatients (after your surgery is over and you are in a regular room).    Your procedure is scheduled on: 06/28/24   Report to [facility] Main Entrance    Report to admitting at 5:15 AM   Call this number if you have problems the morning of surgery [phone]   Do not eat food or drink liquids :After Midnight.          If you have questions, please contact your surgeon's office.   FOLLOW BOWEL PREP AND ANY ADDITIONAL PRE OP INSTRUCTIONS YOU RECEIVED FROM YOUR SURGEON'S OFFICE!!!     Oral Hygiene is also important to reduce your risk of infection.                                    Remember - BRUSH YOUR TEETH THE MORNING OF SURGERY WITH YOUR REGULAR TOOTHPASTE  DENTURES WILL BE REMOVED PRIOR TO SURGERY PLEASE DO NOT APPLY Poly grip OR ADHESIVES!!!   Stop all vitamins and herbal supplements 7 days before surgery.   Take these medicines the morning of surgery with A SIP OF WATER: Inhalers, Rosuvastatin , Synthroid                                You may not have any metal on your body including hair pins, jewelry, and body piercing             Do not wear make-up, lotions, powders, perfumes, or deodorant  Do not wear nail polish including gel and S&S, artificial/acrylic nails, or any other type of  covering on natural nails including finger and toenails. If you have artificial nails, gel coating, etc. that needs to be removed by a nail salon please have this removed prior to surgery or surgery may need to be canceled/ delayed if the surgeon/ anesthesia feels like they are unable to be safely monitored.   Do not shave  48 hours prior to surgery.    Do not bring valuables to the hospital. [facility] IS NOT             RESPONSIBLE   FOR VALUABLES.   Contacts, glasses, dentures or bridgework may not be worn into surgery.  DO NOT BRING YOUR HOME MEDICATIONS TO THE HOSPITAL. PHARMACY WILL DISPENSE MEDICATIONS LISTED ON YOUR MEDICATION LIST TO YOU DURING YOUR ADMISSION IN THE HOSPITAL!    Patients discharged on the day of surgery will not be allowed to drive home.  Someone NEEDS to stay with you for the first 24 hours after anesthesia.              Please read over the following fact sheets  you were given: IF YOU HAVE QUESTIONS ABOUT YOUR PRE-OP INSTRUCTIONS PLEASE CALL [phone]    If you received a COVID test during your pre-op visit  it is requested that you wear a mask when out in public, stay away from anyone that may not be feeling well and notify your surgeon if you develop symptoms. If you test positive for Covid or have been in contact with anyone that has tested positive in the last 10 days please notify you surgeon.    [facility] - Preparing for Surgery Before surgery, you can play an important role.  Because skin is not sterile, your skin needs to be as free of germs as possible.  You can reduce the number of germs on your skin by washing with CHG (chlorahexidine gluconate) soap before surgery.  CHG is an antiseptic cleaner which kills germs and bonds with the skin to continue killing germs even after washing. Please DO NOT use if you have an allergy to CHG or antibacterial soaps.  If your skin becomes reddened/irritated stop using the CHG and inform your nurse when you  arrive at Short Stay. Do not shave (including legs and underarms) for at least 48 hours prior to the first CHG shower.  You may shave your face/neck.  Please follow these instructions carefully:  1.  Shower with CHG Soap the night before surgery and the  morning of surgery.  2.  If you choose to wash your hair, wash your hair first as usual with your normal  shampoo.  3.  After you shampoo, rinse your hair and body thoroughly to remove the shampoo.                             4.  Use CHG as you would any other liquid soap.  You can apply chg directly to the skin and wash.  Gently with a scrungie or clean washcloth.  5.  Apply the CHG Soap to your body ONLY FROM THE NECK DOWN.   Do   not use on face/ open                           Wound or open sores. Avoid contact with eyes, ears mouth and   genitals (private parts).                       Wash face,  Genitals (private parts) with your normal soap.             6.  Wash thoroughly, paying special attention to the area where your    surgery  will be performed.  7.  Thoroughly rinse your body with warm water from the neck down.  8.  DO NOT shower/wash with your normal soap after using and rinsing off the CHG Soap.                9.  Pat yourself dry with a clean towel.            10.  Wear clean pajamas.            11.  Place clean sheets on your bed the night of your first shower and do not  sleep with pets. Day of Surgery : Do not apply any lotions/deodorants the morning of surgery.  Please wear clean clothes to the hospital/surgery center.  FAILURE TO FOLLOW THESE INSTRUCTIONS  MAY RESULT IN THE CANCELLATION OF YOUR SURGERY  PATIENT SIGNATURE_________________________________  NURSE SIGNATURE__________________________________  ________________________________________________________________________

## 2024-06-15 NOTE — Progress Notes (Signed)
 [facility] regarding missing diet order. Triage RN stated Dr. Teresa is out of office until 6/30. Stated most likely will be NPO after midnight.

## 2024-06-16 ENCOUNTER — Other Ambulatory Visit: Payer: Self-pay

## 2024-06-16 ENCOUNTER — Encounter (HOSPITAL_COMMUNITY)
Admission: RE | Admit: 2024-06-16 | Discharge: 2024-06-16 | Disposition: A | Discharge status: Home / Self Care | Source: Ambulatory Visit | Attending: Surgery | Admitting: Surgery

## 2024-06-16 ENCOUNTER — Encounter (HOSPITAL_COMMUNITY): Payer: Self-pay

## 2024-06-16 DIAGNOSIS — Z01812 Encounter for preprocedural laboratory examination: Secondary | ICD-10-CM | POA: Diagnosis not present

## 2024-06-16 DIAGNOSIS — Z01818 Encounter for other preprocedural examination: Secondary | ICD-10-CM

## 2024-06-16 HISTORY — DX: Essential (primary) hypertension: I10

## 2024-06-16 HISTORY — DX: Gastro-esophageal reflux disease without esophagitis: K21.9

## 2024-06-16 LAB — CBC WITH DIFFERENTIAL/PLATELET
Abs Immature Granulocytes: 0.01 10*3/uL (ref 0.00–0.07)
Basophils Absolute: 0 10*3/uL (ref 0.0–0.1)
Basophils Relative: 1 %
Eosinophils Absolute: 0.2 10*3/uL (ref 0.0–0.5)
Eosinophils Relative: 3 %
HCT: 44.3 % (ref 36.0–46.0)
Hemoglobin: 14.2 g/dL (ref 12.0–15.0)
Immature Granulocytes: 0 %
Lymphocytes Relative: 47 %
Lymphs Abs: 2.8 10*3/uL (ref 0.7–4.0)
MCH: 28.5 pg (ref 26.0–34.0)
MCHC: 32.1 g/dL (ref 30.0–36.0)
MCV: 88.8 fL (ref 80.0–100.0)
Monocytes Absolute: 0.5 10*3/uL (ref 0.1–1.0)
Monocytes Relative: 9 %
Neutro Abs: 2.3 10*3/uL (ref 1.7–7.7)
Neutrophils Relative %: 40 %
Platelets: 221 10*3/uL (ref 150–400)
RBC: 4.99 MIL/uL (ref 3.87–5.11)
RDW: 12.9 % (ref 11.5–15.5)
WBC: 5.8 10*3/uL (ref 4.0–10.5)
nRBC: 0 % (ref 0.0–0.2)

## 2024-06-16 LAB — COMPREHENSIVE METABOLIC PANEL WITH GFR
ALT: 22 U/L (ref 0–44)
AST: 34 U/L (ref 15–41)
Albumin: 3.9 g/dL (ref 3.5–5.0)
Alkaline Phosphatase: 67 U/L (ref 38–126)
Anion gap: 8 (ref 5–15)
BUN: 12 mg/dL (ref 8–23)
CO2: 26 mmol/L (ref 22–32)
Calcium: 9.4 mg/dL (ref 8.9–10.3)
Chloride: 105 mmol/L (ref 98–111)
Creatinine, Ser: 0.68 mg/dL (ref 0.44–1.00)
GFR, Estimated: >60 mL/min (ref >60)
Glucose, Bld: 85 mg/dL (ref 70–99)
Potassium: 4.6 mmol/L (ref 3.5–5.1)
Sodium: 139 mmol/L (ref 135–145)
Total Bilirubin: 0.6 mg/dL (ref 0.0–1.2)
Total Protein: 6.7 g/dL (ref 6.5–8.1)

## 2024-06-18 ENCOUNTER — Encounter: Admitting: Plastic Surgery

## 2024-06-21 ENCOUNTER — Ambulatory Visit: Payer: Self-pay | Admitting: Surgery

## 2024-06-21 NOTE — Progress Notes (Signed)
 Anesthesia Chart Review   Case: [id number] Date/Time: 06/28/24 0715   Procedure: LAPAROSCOPIC CHOLECYSTECTOMY - Indocyanine ICG Dye   Anesthesia type: General   Diagnosis: Abdominal pain, right upper quadrant [R10.11]   Pre-op diagnosis: RIGHT UPPER QUADRANT PAIN BILIARY HYPERKINESIA   Location: [facility] ROOM 05 / [facility]   Surgeons: Teresa Lonni HERO, MD       DISCUSSION:67 y.o. never smoker with h/o hypothyroidism, HTN, GERD, asthma, left breast cancer s/p bilateral mastectomies completed tx in 2015, RUQ pain scheduled for above procedure 06/28/2024 with Dr. Lonni Teresa.   Pt last seen by cardiology 12/29/2023. Ca score in 2023 57.4 (75th percentile), last echo 2023 unremarkable. Per note she can do high level of activity without limitations. 1 year follow up recommended.  VS: BP (!) 138/92   Pulse 86   Temp 36.5 C   Resp 16   Ht 5' 4.5 (1.638 m)   Wt 63.5 kg   SpO2 98%   BMI 23.66 kg/m   PROVIDERS: Nichole Senior, MD is PCP   Cardiologist - Shelda Lonni, MD  LABS: Labs reviewed: Acceptable for surgery. (all labs ordered are listed, but only abnormal results are displayed)  Labs Reviewed  CBC WITH DIFFERENTIAL/PLATELET  COMPREHENSIVE METABOLIC PANEL WITH GFR     IMAGES:   EKG:   CV: Echo 02/25/2022 1. Left ventricular ejection fraction, by estimation, is 60 to 65%. The  left ventricle has normal function. The left ventricle has no regional  wall motion abnormalities. Left ventricular diastolic parameters were  normal.   2. Right ventricular systolic function is normal. The right ventricular  size is normal.   3. The mitral valve is normal in structure. Mild mitral valve  regurgitation.   4. Tricuspid valve regurgitation is moderate.   5. The aortic valve is tricuspid. Aortic valve regurgitation is not  visualized. Aortic valve sclerosis is present, with no evidence of aortic  valve stenosis.   6. The inferior vena cava is normal in size with greater  than 50%  respiratory variability, suggesting right atrial pressure of 3 mmHg.  Past Medical History:  Diagnosis Date   Abnormally small mouth    ADD (attention deficit disorder)    ADHD    Allergy    Anxiety    pt reported she doesn't have anxiety   Arthritis    in hands   Asthma    prn inhaler   Blood transfusion without reported diagnosis    Breast cancer (HCC)    Dental crowns present    Difficulty swallowing pills    GERD (gastroesophageal reflux disease)    History of breast cancer    History of cervical cancer    History of palpitations    last echo 08/22/2014   Hypertension    Hypothyroidism    IBS (irritable bowel syndrome)    Vasculitis (HCC)    Dr. Ivin last year dx    Past Surgical History:  Procedure Laterality Date   ABDOMINAL HYSTERECTOMY     ABDOMINOPLASTY  03/28/2004   AXILLARY SENTINEL NODE BIOPSY  12/31/2012   Procedure: AXILLARY SENTINEL NODE BIOPSY;  Surgeon: Donnice Bury, MD;  Location: [facility];  Service: General;  Laterality: Bilateral;  bilateral sentinel node   BRAIN SURGERY     removal benign frontal lobe cyst in 1987   BREAST ENHANCEMENT SURGERY Bilateral ~ 2008   BREAST IMPLANT EXCHANGE Bilateral 12/11/2017   Procedure: BILATERAL IMPLANT EXCHANGE;  Surgeon: Lowery Estefana RAMAN, DO;  Location: [facility];  Service: Plastics;  Laterality: Bilateral;   BREAST RECONSTRUCTION WITH PLACEMENT OF TISSUE EXPANDER AND FLEX HD (ACELLULAR HYDRATED DERMIS)  12/31/2012   Procedure: BREAST RECONSTRUCTION WITH PLACEMENT OF TISSUE EXPANDER AND FLEX HD (ACELLULAR HYDRATED DERMIS);  Surgeon: Estefana Reichert, DO;  Location: [facility];  Service: Plastics;  Laterality: Bilateral;  bilateral immediate breast reconstruction with expanders and flex hd    CAPSULOTOMY Left 09/01/2014   Procedure: CAPSULOTOMY OF LEFT BREAST WITH LIPOFILLING FOR SYMMETRY;  Surgeon: Estefana Reichert, DO;  Location: [facility];   Service: Plastics;  Laterality: Left;   CERVICAL CERCLAGE     DIAGNOSTIC LAPAROSCOPY  05/12/2003   right partial salpingectomy; lysis paraovarian adhesions left   DILATION AND CURETTAGE OF UTERUS  2005   ENDOMETRIAL FULGURATION  05/12/2003   FACIAL COSMETIC SURGERY  ~ 2010   FOOT SURGERY Bilateral 07/2012   shorten toe   GYNECOLOGIC CRYOSURGERY  1998   INCISION AND DRAINAGE OF WOUND Left 04/28/2013   Procedure: IRRIGATION AND DEBRIDEMENT LEFT BREAST WOUND, POSSIBLE CLOSURE OF WOUND WITH DRAIN PLACEMENT;  Surgeon: Estefana Reichert, DO;  Location: [facility];  Service: Plastics;  Laterality: Left;   LATISSIMUS FLAP TO BREAST Left 09/29/2013   Procedure: LATISSIMUS FLAP TO BREAST WITH TISSUE EXPANDER PLACEMENT FOR LEFT BREAST RECONSTRUCTION;  Surgeon: Estefana Reichert, DO;  Location: [facility];  Service: Plastics;  Laterality: Left;   LIPECTOMY Bilateral 03/28/2004   hip   LIPOSUCTION Bilateral 03/09/2014   Procedure: LIPOSUCTION ABDOMEN W/ LIPOFILLING LATERAL BILATERAL BREAST;  Surgeon: Estefana Reichert, DO;  Location: [facility];  Service: Plastics;  Laterality: Bilateral;  LIPOSUCTION NECK   LIPOSUCTION WITH LIPOFILLING Bilateral 09/01/2014   Procedure: LIPOSUCTION WITH LIPOFILLING;  Surgeon: Estefana Reichert, DO;  Location: [facility];  Service: Plastics;  Laterality: Bilateral;   LYSIS OF ADHESION  03/28/2004   pelvic   MASTECTOMY     PORT-A-CATH REMOVAL Right 03/09/2014   Procedure: REMOVAL PORT-A-CATH;  Surgeon: Estefana Reichert, DO;  Location: [facility];  Service: Plastics;  Laterality: Right;   PORTACATH PLACEMENT  01/27/2013   Procedure: INSERTION PORT-A-CATH;  Surgeon: Donnice Bury, MD;  Location: [facility];  Service: General;  Laterality: Right;   RADIOFREQUENCY ABLATION NERVES  05/12/2003   uterosacral nerve ablation   REMOVAL OF BILATERAL TISSUE EXPANDERS WITH PLACEMENT OF BILATERAL BREAST IMPLANTS Bilateral  03/09/2014   Procedure: REMOVAL OF BILATERAL TISSUE EXPANDERS WITH PLACEMENT OF BILATERAL BREAST IMPLANTS;  Surgeon: Estefana Reichert, DO;  Location: [facility];  Service: Plastics;  Laterality: Bilateral;   TEE WITHOUT CARDIOVERSION N/A 06/04/2013   Procedure: TRANSESOPHAGEAL ECHOCARDIOGRAM (TEE);  Surgeon: Toribio JONELLE Fuel, MD;  Location: [facility];  Service: Cardiovascular;  Laterality: N/A;   TISSUE EXPANDER PLACEMENT Left 06/16/2013   Procedure: LEFT BREAST TISSUE EXPANDER REMOVAL;  Surgeon: Estefana Reichert, DO;  Location: [facility];  Service: Plastics;  Laterality: Left;   TISSUE EXPANDER PLACEMENT Left 09/29/2013   Procedure: TISSUE EXPANDER;  Surgeon: Estefana Reichert, DO;  Location[facility];  Service: Plastics;  Laterality: Left;   TOTAL ABDOMINAL HYSTERECTOMY W/ BILATERAL SALPINGOOPHORECTOMY  03/28/2004   TOTAL MASTECTOMY  12/31/2012   Procedure: TOTAL MASTECTOMY;  Surgeon: Donnice Bury, MD;  Location:  SUR[facility]ERY CENTER;  Service: General;  Laterality: Bilateral;  bilateral total mastectomies   VEIN SURGERY     removed from leg due to clots during pregnancy    MEDICATIONS:  acidophilus (RISAQUAD)  CAPS capsule   albuterol  (VENTOLIN  HFA) 108 (90 Base) MCG/ACT inhaler   ascorbic acid  (VITAMIN C ) 500 MG tablet   aspirin EC 81 MG tablet   azithromycin  (AZASITE ) 1 % ophthalmic solution   azithromycin  (ZITHROMAX ) 250 MG tablet   cefdinir  (OMNICEF ) 300 MG capsule   cevimeline  (EVOXAC ) 30 MG capsule   Cholecalciferol (VITAMIN D) 125 MCG (5000 UT) CAPS   ciprofloxacin  (CILOXAN ) 0.3 % ophthalmic solution   colchicine  0.6 MG tablet   COVID-19 mRNA bivalent vaccine, Pfizer, (PFIZER COVID-19 VAC BIVALENT) injection   COVID-19 mRNA vaccine, Pfizer, (COMIRNATY ) syringe   desonide  (DESOWEN ) 0.05 % cream   dextroamphetamine  (DEXTROSTAT ) 10 MG tablet   diphenhydramine -acetaminophen  (TYLENOL  PM) 25-500 MG TABS tablet   EPINEPHrine  (EPIPEN  2-PAK) 0.3  mg/0.3 mL IJ SOAJ injection   famotidine  (PEPCID ) 40 MG tablet   HYDROcodone  bit-homatropine (HYDROMET) 5-1.5 MG/5ML syrup   hyoscyamine  (OSCIMIN ) 0.125 MG SL tablet   levocetirizine (XYZAL ) 5 MG tablet   losartan  (COZAAR ) 50 MG tablet   Multiple Vitamin (MULTIVITAMIN WITH MINERALS) TABS   phenazopyridine  (PYRIDIUM ) 200 MG tablet   predniSONE  (STERAPRED UNI-PAK 21 TAB) 10 MG (21) TBPK tablet   RESTASIS  0.05 % ophthalmic emulsion   rifaximin  (XIFAXAN ) 550 MG TABS tablet   rosuvastatin  (CRESTOR ) 5 MG tablet   SYNTHROID  100 MCG tablet   tobramycin  (TOBREX ) 0.3 % ophthalmic solution   trimethoprim -polymyxin b  (POLYTRIM ) ophthalmic solution   valACYclovir  (VALTREX ) 500 MG tablet   No current facility-administered medications for this encounter.     Harlene Hoots Ward, PA-C [facility] [phone]

## 2024-06-22 ENCOUNTER — Other Ambulatory Visit (HOSPITAL_BASED_OUTPATIENT_CLINIC_OR_DEPARTMENT_OTHER): Payer: Self-pay

## 2024-06-22 ENCOUNTER — Other Ambulatory Visit: Payer: Self-pay

## 2024-06-28 ENCOUNTER — Other Ambulatory Visit: Payer: Self-pay

## 2024-06-28 ENCOUNTER — Encounter (HOSPITAL_COMMUNITY): Payer: Self-pay | Admitting: Surgery

## 2024-06-28 ENCOUNTER — Ambulatory Visit (HOSPITAL_BASED_OUTPATIENT_CLINIC_OR_DEPARTMENT_OTHER): Admitting: Anesthesiology

## 2024-06-28 ENCOUNTER — Other Ambulatory Visit (HOSPITAL_COMMUNITY): Payer: Self-pay

## 2024-06-28 ENCOUNTER — Encounter (HOSPITAL_COMMUNITY)
Admission: RE | Disposition: A | Discharge status: Home / Self Care | Payer: Self-pay | Source: Home / Self Care | Attending: Surgery

## 2024-06-28 ENCOUNTER — Ambulatory Visit (HOSPITAL_COMMUNITY): Payer: Self-pay | Admitting: Physician Assistant

## 2024-06-28 ENCOUNTER — Ambulatory Visit (HOSPITAL_COMMUNITY)
Admission: RE | Admit: 2024-06-28 | Discharge: 2024-06-28 | Disposition: A | Discharge status: Home / Self Care | Attending: Surgery | Admitting: Surgery

## 2024-06-28 DIAGNOSIS — E039 Hypothyroidism, unspecified: Secondary | ICD-10-CM | POA: Insufficient documentation

## 2024-06-28 DIAGNOSIS — M1990 Unspecified osteoarthritis, unspecified site: Secondary | ICD-10-CM | POA: Insufficient documentation

## 2024-06-28 DIAGNOSIS — I10 Essential (primary) hypertension: Secondary | ICD-10-CM | POA: Diagnosis not present

## 2024-06-28 DIAGNOSIS — K828 Other specified diseases of gallbladder: Secondary | ICD-10-CM

## 2024-06-28 DIAGNOSIS — K219 Gastro-esophageal reflux disease without esophagitis: Secondary | ICD-10-CM | POA: Diagnosis not present

## 2024-06-28 DIAGNOSIS — D649 Anemia, unspecified: Secondary | ICD-10-CM | POA: Diagnosis not present

## 2024-06-28 DIAGNOSIS — J45909 Unspecified asthma, uncomplicated: Secondary | ICD-10-CM | POA: Diagnosis not present

## 2024-06-28 DIAGNOSIS — Z853 Personal history of malignant neoplasm of breast: Secondary | ICD-10-CM | POA: Insufficient documentation

## 2024-06-28 DIAGNOSIS — K838 Other specified diseases of biliary tract: Secondary | ICD-10-CM | POA: Diagnosis not present

## 2024-06-28 DIAGNOSIS — K811 Chronic cholecystitis: Secondary | ICD-10-CM | POA: Insufficient documentation

## 2024-06-28 DIAGNOSIS — R519 Headache, unspecified: Secondary | ICD-10-CM | POA: Diagnosis not present

## 2024-06-28 DIAGNOSIS — F419 Anxiety disorder, unspecified: Secondary | ICD-10-CM | POA: Insufficient documentation

## 2024-06-28 DIAGNOSIS — R1011 Right upper quadrant pain: Secondary | ICD-10-CM | POA: Diagnosis not present

## 2024-06-28 HISTORY — PX: CHOLECYSTECTOMY: SHX55

## 2024-06-28 SURGERY — LAPAROSCOPIC CHOLECYSTECTOMY
Anesthesia: General

## 2024-06-28 MED ORDER — BUPIVACAINE-EPINEPHRINE 0.25% -1:200000 IJ SOLN
INTRAMUSCULAR | Status: DC | PRN
  Administered 2024-06-28: 20 mL

## 2024-06-28 MED ORDER — CHLORHEXIDINE GLUCONATE CLOTH 2 % EX PADS: 6.0000 | MEDICATED_PAD | Freq: Once | CUTANEOUS | Status: DC

## 2024-06-28 MED ORDER — ONDANSETRON HCL 4 MG/2ML IJ SOLN
INTRAMUSCULAR | Status: AC
  Filled 2024-06-28: qty 2

## 2024-06-28 MED ORDER — DEXAMETHASONE SODIUM PHOSPHATE 10 MG/ML IJ SOLN
INTRAMUSCULAR | Status: DC | PRN
  Administered 2024-06-28: 10 mg via INTRAVENOUS

## 2024-06-28 MED ORDER — MIDAZOLAM HCL 2 MG/2ML IJ SOLN
INTRAMUSCULAR | Status: AC
  Filled 2024-06-28: qty 2

## 2024-06-28 MED ORDER — DEXAMETHASONE SODIUM PHOSPHATE 10 MG/ML IJ SOLN
INTRAMUSCULAR | Status: AC
  Filled 2024-06-28: qty 1

## 2024-06-28 MED ORDER — LIDOCAINE HCL (CARDIAC) PF 100 MG/5ML IV SOSY
PREFILLED_SYRINGE | INTRAVENOUS | Status: DC | PRN
  Administered 2024-06-28: 40 mg via INTRAVENOUS

## 2024-06-28 MED ORDER — ROCURONIUM BROMIDE 100 MG/10ML IV SOLN
INTRAVENOUS | Status: DC | PRN
  Administered 2024-06-28: 50 mg via INTRAVENOUS
  Administered 2024-06-28: 20 mg via INTRAVENOUS

## 2024-06-28 MED ORDER — CHLORHEXIDINE GLUCONATE 0.12 % MT SOLN
15.0000 mL | Freq: Once | OROMUCOSAL | Status: AC
  Administered 2024-06-28: 15 mL via OROMUCOSAL

## 2024-06-28 MED ORDER — HYDROMORPHONE HCL 1 MG/ML IJ SOLN
INTRAMUSCULAR | Status: DC | PRN
  Administered 2024-06-28: 1 mg via INTRAVENOUS

## 2024-06-28 MED ORDER — PROPOFOL 10 MG/ML IV BOLUS
INTRAVENOUS | Status: DC | PRN
  Administered 2024-06-28: 130 mg via INTRAVENOUS

## 2024-06-28 MED ORDER — HYDROMORPHONE HCL 2 MG/ML IJ SOLN
INTRAMUSCULAR | Status: AC
  Filled 2024-06-28: qty 1

## 2024-06-28 MED ORDER — STERILE WATER FOR INJECTION IJ SOLN
INTRAMUSCULAR | Status: AC
  Filled 2024-06-28: qty 10

## 2024-06-28 MED ORDER — LIDOCAINE HCL (PF) 2 % IJ SOLN
INTRAMUSCULAR | Status: AC
  Filled 2024-06-28: qty 5

## 2024-06-28 MED ORDER — LACTATED RINGERS IV SOLN: INTRAVENOUS | Status: DC

## 2024-06-28 MED ORDER — ALBUMIN HUMAN 5 % IV SOLN
INTRAVENOUS | Status: DC | PRN
Start: 2024-06-28 — End: 2024-06-28

## 2024-06-28 MED ORDER — VASOPRESSIN 20 UNIT/ML IV SOLN
INTRAVENOUS | Status: AC
  Filled 2024-06-28: qty 1

## 2024-06-28 MED ORDER — OXYCODONE HCL 5 MG/5ML PO SOLN: 5.0000 mg | Freq: Once | ORAL | Status: DC | PRN

## 2024-06-28 MED ORDER — DROPERIDOL 2.5 MG/ML IJ SOLN
0.6250 mg | Freq: Once | INTRAMUSCULAR | Status: AC | PRN
  Administered 2024-06-28: 0.625 mg via INTRAVENOUS

## 2024-06-28 MED ORDER — EPHEDRINE SULFATE (PRESSORS) 50 MG/ML IJ SOLN
INTRAMUSCULAR | Status: DC | PRN
  Administered 2024-06-28 (×2): 10 mg via INTRAVENOUS

## 2024-06-28 MED ORDER — FENTANYL CITRATE PF 50 MCG/ML IJ SOSY: 25.0000 ug | PREFILLED_SYRINGE | INTRAMUSCULAR | Status: DC | PRN

## 2024-06-28 MED ORDER — INDOCYANINE GREEN 25 MG IV SOLR
INTRAVENOUS | Status: DC | PRN
  Administered 2024-06-28: 2.5 mg via INTRAVENOUS

## 2024-06-28 MED ORDER — TRAMADOL HCL 50 MG PO TABS
50.0000 mg | ORAL_TABLET | Freq: Four times a day (QID) | ORAL | 0 refills | Status: AC | PRN
  Filled 2024-06-28: qty 15, 4d supply, fill #0

## 2024-06-28 MED ORDER — ONDANSETRON HCL 4 MG/2ML IJ SOLN
INTRAMUSCULAR | Status: DC | PRN
  Administered 2024-06-28: 4 mg via INTRAVENOUS

## 2024-06-28 MED ORDER — LACTATED RINGERS IR SOLN
Status: DC | PRN
  Administered 2024-06-28: 1000 mL

## 2024-06-28 MED ORDER — FENTANYL CITRATE (PF) 100 MCG/2ML IJ SOLN
INTRAMUSCULAR | Status: DC | PRN
  Administered 2024-06-28 (×2): 50 ug via INTRAVENOUS

## 2024-06-28 MED ORDER — FENTANYL CITRATE (PF) 100 MCG/2ML IJ SOLN
INTRAMUSCULAR | Status: AC
  Filled 2024-06-28: qty 2

## 2024-06-28 MED ORDER — SCOPOLAMINE 1 MG/3DAYS TD PT72: 1.0000 | MEDICATED_PATCH | TRANSDERMAL | Status: DC

## 2024-06-28 MED ORDER — ORAL CARE MOUTH RINSE: 15.0000 mL | Freq: Once | OROMUCOSAL | Status: AC

## 2024-06-28 MED ORDER — OXYCODONE HCL 5 MG PO TABS: 5.0000 mg | ORAL_TABLET | Freq: Once | ORAL | Status: DC | PRN

## 2024-06-28 MED ORDER — PHENYLEPHRINE HCL (PRESSORS) 10 MG/ML IV SOLN
INTRAVENOUS | Status: DC | PRN
  Administered 2024-06-28: 160 ug via INTRAVENOUS

## 2024-06-28 MED ORDER — CEFAZOLIN SODIUM-DEXTROSE 2-4 GM/100ML-% IV SOLN
2.0000 g | INTRAVENOUS | Status: DC
  Filled 2024-06-28: qty 100

## 2024-06-28 MED ORDER — MIDAZOLAM HCL 5 MG/5ML IJ SOLN
INTRAMUSCULAR | Status: DC | PRN
  Administered 2024-06-28: 2 mg via INTRAVENOUS

## 2024-06-28 MED ORDER — SUGAMMADEX SODIUM 200 MG/2ML IV SOLN
INTRAVENOUS | Status: DC | PRN
Start: 2024-06-28 — End: 2024-06-28
  Administered 2024-06-28: 200 mg via INTRAVENOUS

## 2024-06-28 MED ORDER — ACETAMINOPHEN 500 MG PO TABS
1000.0000 mg | ORAL_TABLET | ORAL | Status: AC
  Administered 2024-06-28: 1000 mg via ORAL
  Filled 2024-06-28: qty 2

## 2024-06-28 MED ORDER — BUPIVACAINE-EPINEPHRINE (PF) 0.25% -1:200000 IJ SOLN
INTRAMUSCULAR | Status: AC
  Filled 2024-06-28: qty 30

## 2024-06-28 MED ORDER — ACETAMINOPHEN 10 MG/ML IV SOLN: 1000.0000 mg | Freq: Once | INTRAVENOUS | Status: DC | PRN

## 2024-06-28 MED ORDER — PROPOFOL 10 MG/ML IV BOLUS
INTRAVENOUS | Status: AC
Start: 2024-06-28 — End: 2024-06-28
  Filled 2024-06-28: qty 20

## 2024-06-28 MED ORDER — CEFAZOLIN SODIUM-DEXTROSE 2-3 GM-%(50ML) IV SOLR
INTRAVENOUS | Status: DC | PRN
  Administered 2024-06-28: 2 g via INTRAVENOUS

## 2024-06-28 MED ORDER — 0.9 % SODIUM CHLORIDE (POUR BTL) OPTIME
TOPICAL | Status: DC | PRN
  Administered 2024-06-28: 1000 mL

## 2024-06-28 MED ORDER — DROPERIDOL 2.5 MG/ML IJ SOLN
INTRAMUSCULAR | Status: AC
  Filled 2024-06-28: qty 2

## 2024-06-28 SURGICAL SUPPLY — 37 items
APPLICATOR ARISTA FLEXITIP XL (MISCELLANEOUS) IMPLANT
BAG COUNTER SPONGE SURGICOUNT (BAG) IMPLANT
CABLE HIGH FREQUENCY MONO STRZ (ELECTRODE) ×1 IMPLANT
CHLORAPREP W/TINT 26 (MISCELLANEOUS) ×1 IMPLANT
CLIP APPLIE 5 13 M/L LIGAMAX5 (MISCELLANEOUS) ×1 IMPLANT
CLIP APPLIE ROT 10 11.4 M/L (STAPLE) IMPLANT
COVER MAYO STAND XLG (MISCELLANEOUS) ×1 IMPLANT
COVER SURGICAL LIGHT HANDLE (MISCELLANEOUS) ×1 IMPLANT
DERMABOND ADVANCED .7 DNX12 (GAUZE/BANDAGES/DRESSINGS) ×1 IMPLANT
DISSECTOR BLUNT TIP ENDO 5MM (MISCELLANEOUS) IMPLANT
DRAPE C-ARM 42X120 X-RAY (DRAPES) IMPLANT
ELECT PENCIL ROCKER SW 15FT (MISCELLANEOUS) ×1 IMPLANT
ELECT REM PT RETURN 15FT ADLT (MISCELLANEOUS) ×1 IMPLANT
GLOVE BIO SURGEON STRL SZ7.5 (GLOVE) ×1 IMPLANT
GLOVE INDICATOR 8.0 STRL GRN (GLOVE) ×1 IMPLANT
GOWN STRL REUS W/ TWL XL LVL3 (GOWN DISPOSABLE) ×2 IMPLANT
GRASPER SUT TROCAR 14GX15 (MISCELLANEOUS) IMPLANT
HEMOSTAT ARISTA ABSORB 3G PWDR (HEMOSTASIS) IMPLANT
HEMOSTAT SNOW SURGICEL 2X4 (HEMOSTASIS) IMPLANT
IRRIGATION SUCT STRKRFLW 2 WTP (MISCELLANEOUS) ×1 IMPLANT
KIT BASIN OR (CUSTOM PROCEDURE TRAY) ×1 IMPLANT
KIT IMAGING PINPOINTPAQ (MISCELLANEOUS) IMPLANT
KIT TURNOVER KIT A (KITS) ×1 IMPLANT
NDL INSUFFLATION 14GA 120MM (NEEDLE) IMPLANT
NEEDLE INSUFFLATION 14GA 120MM (NEEDLE) IMPLANT
SCISSORS LAP 5X35 DISP (ENDOMECHANICALS) ×1 IMPLANT
SET CHOLANGIOGRAPH MIX (MISCELLANEOUS) IMPLANT
SET TUBE SMOKE EVAC HIGH FLOW (TUBING) ×1 IMPLANT
SLEEVE ADV FIXATION 5X100MM (TROCAR) ×2 IMPLANT
SPIKE FLUID TRANSFER (MISCELLANEOUS) ×1 IMPLANT
SUT MNCRL AB 4-0 PS2 18 (SUTURE) ×1 IMPLANT
SYR 20ML ECCENTRIC (SYRINGE) ×1 IMPLANT
SYSTEM BAG RETRIEVAL 10MM (BASKET) ×1 IMPLANT
TOWEL OR 17X26 10 PK STRL BLUE (TOWEL DISPOSABLE) ×1 IMPLANT
TRAY LAPAROSCOPIC (CUSTOM PROCEDURE TRAY) ×1 IMPLANT
TROCAR ADV FIXATION 5X100MM (TROCAR) ×1 IMPLANT
TROCAR BALLN 12MMX100 BLUNT (TROCAR) ×1 IMPLANT

## 2024-06-28 NOTE — Op Note (Signed)
 06/28/2024 9:03 AM  PATIENT: Jane Gutierrez  68 y.o. female  Patient Care Team: Nichole Senior, MD as PCP - General (Endocrinology) Lonni Slain, MD as PCP - Cardiology (Cardiology)  PRE-OPERATIVE DIAGNOSIS: Biliary hyperkinesia  POST-OPERATIVE DIAGNOSIS: Same  PROCEDURE: Laparoscopic cholecystectomy with indocyanine green  cholangiography  SURGEON: Lonni Pizza, MD  ANESTHESIA: General endotracheal  EBL: 5 mL  DRAINS: None  SPECIMEN: Gallbladder  COUNTS: Sponge, needle and instrument counts were reported correct x2 at the conclusion of the operation  DISPOSITION: PACU in satisfactory condition  COMPLICATIONS: none  FINDINGS: Prominent note of Calot.  Otherwise normal-appearing gallbladder and liver.  Critical view of safety was achieved prior to clipping or dividing any structures.  Indocyanine green  cholangiography demonstrates patent biliary system no apparent aberrant ductal anatomy.  DESCRIPTION: The patient was identified & brought into the operating room. She was then positioned supine on the OR table. SCDs were in place and active during the entire case. She then underwent general endotracheal anesthesia. Pressure points were padded. Hair on the abdomen was clipped by the OR team. The abdomen was prepped and draped in the standard sterile fashion. Antibiotics were administered. A surgical timeout was performed and confirmed our plan.   A periumbilical incision was made incorporating her scar from her prior abdominoplasty. The umbilical stalk was grasped and retracted outwardly. The infraumbilical fascia was identified and incised. The peritoneal cavity was gently entered bluntly. A purse-string 0 Vicryl suture was placed. The Hasson cannula was inserted into the peritoneal cavity and insufflation with CO2 commenced to . A laparoscope was inserted into the peritoneal cavity and inspection confirmed no evidence of trocar site complications. The patient was  then positioned in reverse Trendelenburg with slight left side down. 3 additional 5mm trocars were placed along the right subcostal line - one 5mm port in mid subcostal region, another 5mm port in the right flank near the anterior axillary line, and a third 5mm port in the left subxiphoid region obliquely near the falciform ligament.  The liver and gallbladder were inspected.  The liver is normal in appearance.  Gallbladder is relatively normal in appearance.  There is a chronic appearing cystic node/node of Calot. The gallbladder fundus was grasped and elevated cephalad. An additional grasper was then placed on the infundibulum of the gallbladder and the infundibulum was retracted laterally. Staying high on the gallbladder, the peritoneum on both sides of the gallbladder was opened with hook cautery. Gentle blunt dissection was then employed with a [place]  dissector working down into [employer]. The cystic duct was identified and carefully circumferentially dissected. The cystic artery was also identified and carefully circumferentially dissected. The space between the cystic artery and hepatocystic plate was developed such that a good view of the liver could be seen through a window medial to the cystic artery. The triangle of Calot had been cleared of all fibrofatty tissue.  There are some very small 1 to 2 mm branching vessels within the triangle of Calot.  At this point, a critical view of safety was achieved and the only structures visualized was the skeletonized cystic duct laterally, the skeletonized cystic artery and the liver through the window medial to the artery.  A diminutive posterior cystic artery was noted.  Under near-infrared light, indocyanine green  cholangiography demonstrates uptake by the liver and excretion into the biliary system.  Green dye is seen extending into the cannula at cystic duct.  There is no opacification of the cystic artery or its diminutive posterior position.  There is some filling of the gallbladder.  There is also transsphincteric activity seen through the wall of the duodenum consistent with a patent biliary system.  The cystic duct and artery were clipped with 2 clips on the patient side and 1 clip on the specimen side.  Posterior/diminutive branch was controlled similarly.  We also placed a clip on 1 of these small vessels within the triangle of Calot. The cystic duct and artery were then divided.  The rather prominent 5 to 6 mm Calot lymph node that was overlying her cystic artery was also submitted separately.  The gallbladder was then freed from its remaining attachments to the liver using electrocautery and placed into an endocatch bag. The RUQ was gently irrigated with sterile saline. Hemostasis was then verified. The clips were in good position; the gallbladder fossa was dry. The rest of the abdomen was inspected no injury nor bleeding elsewhere was identified.  The endocatch bag containing the gallbladder was then removed from the umbilical port site and passed off as specimen. The RUQ ports were removed under direct visualization and noted to be hemostatic. The umbilical fascia was then closed using the 0 Vicryl purse-string suture. The fascia was palpated and noted to be completely closed. The skin of all incision sites was approximated with 4-0 monocryl subcuticular suture and dermabond applied. She was then awakened from anesthesia, extubated, and transferred to a stretcher for transport to PACU in satisfactory condition.

## 2024-06-28 NOTE — Discharge Instructions (Addendum)
POST OP INSTRUCTIONS  DIET: As tolerated. Follow a light bland diet the first 24 hours after arrival home, such as soup, liquids, crackers, etc.  Be sure to include lots of fluids daily.  Avoid fast food or heavy meals as your are more likely to get nauseated.  Eat a low fat the next few days after surgery.  Take your usually prescribed home medications unless otherwise directed.  PAIN CONTROL: Pain is best controlled by a usual combination of three different methods TOGETHER: Ice/Heat Over the counter pain medication Prescription pain medication Most patients will experience some swelling and bruising around the surgical site.  Ice packs or heating pads (30-60 minutes up to 6 times a day) will help. Some people prefer to use ice alone, heat alone, alternating between ice & heat.  Experiment to what works for you.  Swelling and bruising can take several weeks to resolve.   It is helpful to take an over-the-counter pain medication regularly for the first few weeks: Ibuprofen (Motrin/Advil) - 200mg tabs - take 3 tabs (600mg) every 6 hours as needed for pain Acetaminophen (Tylenol) - you may take 650mg every 6 hours as needed. You can take this with motrin as they act differently on the body. If you are taking a narcotic pain medication that has acetaminophen in it, do not take over the counter tylenol at the same time.  Iii. NOTE: You may take both of these medications together - most patients  find it most helpful when alternating between the two (i.e. Ibuprofen at 6am,  tylenol at 9am, ibuprofen at 12pm ...) A  prescription for pain medication should be given to you upon discharge.  Take your pain medication as prescribed if your pain is not adequatly controlled with the over-the-counter pain reliefs mentioned above.  Avoid getting constipated.  Between the surgery and the pain medications, it is common to experience some constipation.  Increasing fluid intake and taking a fiber supplement (such as  Metamucil, Citrucel, FiberCon, MiraLax, etc) 1-2 times a day regularly will usually help prevent this problem from occurring.  A mild laxative (prune juice, Milk of Magnesia, MiraLax, etc) should be taken according to package directions if there are no bowel movements after 48 hours.    Dressing: Your incisions are covered in Dermabond which is like sterile superglue for the skin. This will come off on it's own in a couple weeks. It is waterproof and you may bathe normally starting the day after your surgery in a shower. Avoid baths/pools/lakes/oceans until your wounds have fully healed.  ACTIVITIES as tolerated:   Avoid heavy lifting (>10lbs or 1 gallon of milk) for the next 6 weeks. You may resume regular (light) daily activities beginning the next day--such as daily self-care, walking, climbing stairs--gradually increasing activities as tolerated.  If you can walk 30 minutes without difficulty, it is safe to try more intense activity such as jogging, treadmill, bicycling, low-impact aerobics.  DO NOT PUSH THROUGH PAIN.  Let pain be your guide: If it hurts to do something, don't do it. You may drive when you are no longer taking prescription pain medication, you can comfortably wear a seatbelt, and you can safely maneuver your car and apply brakes.   FOLLOW UP in our office Please call [facility] at [phone] to set up an appointment to see your surgeon in the office for a follow-up appointment approximately 2 weeks after your surgery. Make sure that you call for this appointment the day you arrive home to   insure a convenient appointment time.  9. If you have disability or family leave forms that need to be completed, you may have them completed by your primary care physician's office; for return to work instructions, please ask our office staff and they will be happy to assist you in obtaining this documentation   When to call us [phone]: Poor pain control Reactions / problems with new  medications (rash/itching, etc)  Fever over 101.5 F (38.5 C) Inability to urinate Nausea/vomiting Worsening swelling or bruising Continued bleeding from incision. Increased pain, redness, or drainage from the incision  The clinic staff is available to answer your questions during regular business hours (8:30am-5pm).  Please don't hesitate to call and ask to speak to one of our nurses for clinical concerns.   A surgeon from [facility] is always on call at the hospitals   If you have a medical emergency, go to the nearest emergency room or call 911.  [facility] [address], [address], [place], [place]  [address] MAIN: [phone] FAX: [phone] [email or web address] 

## 2024-06-28 NOTE — Anesthesia Postprocedure Evaluation (Signed)
 Anesthesia Post Note  Patient: Jane Gutierrez  Procedure(s) Performed: LAPAROSCOPIC CHOLECYSTECTOMY     Patient location during evaluation: PACU Anesthesia Type: General Level of consciousness: awake and alert Pain management: pain level controlled Vital Signs Assessment: post-procedure vital signs reviewed and stable Respiratory status: spontaneous breathing, nonlabored ventilation, respiratory function stable and patient connected to nasal cannula oxygen Cardiovascular status: blood pressure returned to baseline and stable Postop Assessment: no apparent nausea or vomiting Anesthetic complications: no   No notable events documented.  Last Vitals:  Vitals:   06/28/24 1100 06/28/24 1114  BP: 139/75 138/82  Pulse: 76 80  Resp: 12   Temp: 36.5 C (!) 36.4 C  SpO2: 94% 95%    Last Pain:  Vitals:   06/28/24 1114  TempSrc: Oral  PainSc: 1                  Franky JONETTA Bald

## 2024-06-28 NOTE — Anesthesia Procedure Notes (Addendum)
 Procedure Name: Intubation Date/Time: 06/28/2024 7:40 AM  Performed by: Lanning Cena RAMAN, CRNAPre-anesthesia Checklist: Emergency Drugs available, Suction available, Patient being monitored, Timeout performed and Patient identified Patient Re-evaluated:Patient Re-evaluated prior to induction Oxygen Delivery Method: Circle system utilized Preoxygenation: Pre-oxygenation with 100% oxygen Induction Type: IV induction Ventilation: Mask ventilation without difficulty Laryngoscope Size: Glidescope and 3 (DL x 1 (Miller 2 - Grade 3 view) with subsequent successful intubation with Glidescope) Grade View: Grade I Tube type: Oral Tube size: 7.0 mm Number of attempts: 1 Airway Equipment and Method: Video-laryngoscopy and Rigid stylet Placement Confirmation: ETT inserted through vocal cords under direct vision, positive ETCO2, CO2 detector and breath sounds checked- equal and bilateral Secured at: 21 cm Tube secured with: Tape Dental Injury: Teeth and Oropharynx as per pre-operative assessment

## 2024-06-28 NOTE — Transfer of Care (Signed)
 Immediate Anesthesia Transfer of Care Note  Patient: Jane Gutierrez  Procedure(s) Performed: LAPAROSCOPIC CHOLECYSTECTOMY  Patient Location: PACU  Anesthesia Type:General  Level of Consciousness: alert  and oriented  Airway & Oxygen Therapy: Patient Spontanous Breathing and Patient connected to face mask oxygen  Post-op Assessment: Report given to RN and Post -op Vital signs reviewed and stable  Post vital signs: Reviewed and stable  Last Vitals:  Vitals Value Taken Time  BP 148/85 06/28/24 09:16  Temp    Pulse 74 06/28/24 09:18  Resp 9 06/28/24 09:18  SpO2 100 % 06/28/24 09:18  Vitals shown include unfiled device data.  Last Pain:  Vitals:   06/28/24 0601  TempSrc: Oral  PainSc:          Complications: No notable events documented.

## 2024-06-28 NOTE — H&P (Signed)
 CC: Here today for surgery  HPI: Jane Gutierrez is an 68 y.o. female with history of HTN, hypothyroidism, whom is seen in the office today as a referral by Dr. Kristie for evaluation of possible biliary hyperkinesia.   RUQ US  03/17/24 1. No acute abnormality identified. 2. Mild increased echotexture of the liver. This is a nonspecific finding but can be seen in fatty infiltration of liver.  **No gallstones or gallbladder wall thickening seen on US  CBD measured 2 mm  HIDA Scan 03/17/24 1. Normal filling of the gallbladder. Patent cystic duct and common bile duct. 2. Rapid imaging of the gallbladder with ejection fraction equal 86%. Findings could indicate biliary hyperkinesia.  She reports a history of various GI related issues including IBS, constipation/diarrhea. She reports that she has had multiple bouts over the last couple years of severe deep boring cramp in the right upper quadrant that will occasionally radiate to her back. The worst of this was in March of this year. The pain did for a few days before finally leading up. She has tried Prilosec, pantoprazole , and other OTC remedies in an effort to treat this none of which have seemed to help. For the last 20 years she has maintained a low-fat diet. She does note that if she were to eat any sort of significant greasy fatty foods that this would reliably recreate some of the symptoms including the pain.  She denies any changes in health or health history since we met in the office. No new medications/allergies. She states she is ready for surgery today.  PMH: HTN, hypothyroidism, history of remote cervical cancer; breast cancer (2013)  PSH: TAH (adenomyomatosis of the uterus); breast reconstruction using subQ fat grafts from abd wall  FHx: Daughter has celiac sprue; denies any known family history of colorectal, breast, endometrial or ovarian cancer  Social Hx: Denies use of tobacco/EtOH/illicit drug. She is happily retired,  previously working locally as a Chemical engineer with [employer]. She is here today by herself.    Past Medical History:  Diagnosis Date   Abnormally small mouth    ADD (attention deficit disorder)    ADHD    Allergy    Anxiety    pt reported she doesn't have anxiety   Arthritis    in hands   Asthma    prn inhaler   Blood transfusion without reported diagnosis    Breast cancer (HCC)    Dental crowns present    Difficulty swallowing pills    GERD (gastroesophageal reflux disease)    History of breast cancer    History of cervical cancer    History of palpitations    last echo 08/22/2014   Hypertension    Hypothyroidism    IBS (irritable bowel syndrome)    Vasculitis (HCC)    Dr. Ivin last year dx    Past Surgical History:  Procedure Laterality Date   ABDOMINAL HYSTERECTOMY     ABDOMINOPLASTY  03/28/2004   AXILLARY SENTINEL NODE BIOPSY  12/31/2012   Procedure: AXILLARY SENTINEL NODE BIOPSY;  Surgeon: Donnice Bury, MD;  Location: [facility];  Service: General;  Laterality: Bilateral;  bilateral sentinel node   BRAIN SURGERY     removal benign frontal lobe cyst in 1987   BREAST ENHANCEMENT SURGERY Bilateral ~ 2008   BREAST IMPLANT EXCHANGE Bilateral 12/11/2017   Procedure: BILATERAL IMPLANT EXCHANGE;  Surgeon: Lowery Estefana RAMAN, DO;  Location:  SURGERY CENTER;  Service: Plastics;  Laterality: Bilateral;  BREAST RECONSTRUCTION WITH PLACEMENT OF TISSUE EXPANDER AND FLEX HD (ACELLULAR HYDRATED DERMIS)  12/31/2012   Procedure: BREAST RECONSTRUCTION WITH PLACEMENT OF TISSUE EXPANDER AND FLEX HD (ACELLULAR HYDRATED DERMIS);  Surgeon: Estefana Reichert, DO;  Location: [facility];  Service: Plastics;  Laterality: Bilateral;  bilateral immediate breast reconstruction with expanders and flex hd    CAPSULOTOMY Left 09/01/2014   Procedure: CAPSULOTOMY OF LEFT BREAST WITH LIPOFILLING FOR SYMMETRY;  Surgeon: Estefana Reichert, DO;   Location: [facility];  Service: Plastics;  Laterality: Left;   CERVICAL CERCLAGE     DIAGNOSTIC LAPAROSCOPY  05/12/2003   right partial salpingectomy; lysis paraovarian adhesions left   DILATION AND CURETTAGE OF UTERUS  2005   ENDOMETRIAL FULGURATION  05/12/2003   FACIAL COSMETIC SURGERY  ~ 2010   FOOT SURGERY Bilateral 07/2012   shorten toe   GYNECOLOGIC CRYOSURGERY  1998   INCISION AND DRAINAGE OF WOUND Left 04/28/2013   Procedure: IRRIGATION AND DEBRIDEMENT LEFT BREAST WOUND, POSSIBLE CLOSURE OF WOUND WITH DRAIN PLACEMENT;  Surgeon: Estefana Reichert, DO;  Location: [facility];  Service: Plastics;  Laterality: Left;   LATISSIMUS FLAP TO BREAST Left 09/29/2013   Procedure: LATISSIMUS FLAP TO BREAST WITH TISSUE EXPANDER PLACEMENT FOR LEFT BREAST RECONSTRUCTION;  Surgeon: Estefana Reichert, DO;  Location: [facility];  Service: Plastics;  Laterality: Left;   LIPECTOMY Bilateral 03/28/2004   hip   LIPOSUCTION Bilateral 03/09/2014   Procedure: LIPOSUCTION ABDOMEN W/ LIPOFILLING LATERAL BILATERAL BREAST;  Surgeon: Estefana Reichert, DO;  Location: [facility];  Service: Plastics;  Laterality: Bilateral;  LIPOSUCTION NECK   LIPOSUCTION WITH LIPOFILLING Bilateral 09/01/2014   Procedure: LIPOSUCTION WITH LIPOFILLING;  Surgeon: Estefana Reichert, DO;  Location: [facility];  Service: Plastics;  Laterality: Bilateral;   LYSIS OF ADHESION  03/28/2004   pelvic   MASTECTOMY     PORT-A-CATH REMOVAL Right 03/09/2014   Procedure: REMOVAL PORT-A-CATH;  Surgeon: Estefana Reichert, DO;  Location: [facility];  Service: Plastics;  Laterality: Right;   PORTACATH PLACEMENT  01/27/2013   Procedure: INSERTION PORT-A-CATH;  Surgeon: Donnice Bury, MD;  Location: [facility];  Service: General;  Laterality: Right;   RADIOFREQUENCY ABLATION NERVES  05/12/2003   uterosacral nerve ablation   REMOVAL OF BILATERAL TISSUE EXPANDERS WITH PLACEMENT OF  BILATERAL BREAST IMPLANTS Bilateral 03/09/2014   Procedure: REMOVAL OF BILATERAL TISSUE EXPANDERS WITH PLACEMENT OF BILATERAL BREAST IMPLANTS;  Surgeon: Estefana Reichert, DO;  Location: [facility];  Service: Plastics;  Laterality: Bilateral;   TEE WITHOUT CARDIOVERSION N/A 06/04/2013   Procedure: TRANSESOPHAGEAL ECHOCARDIOGRAM (TEE);  Surgeon: Toribio JONELLE Fuel, MD;  Location[facility];  Service: Cardiovascular;  Laterality: N/A;   TISSUE EXPANDER PLACEMENT Left 06/16/2013   Procedure: LEFT BREAST TISSUE EXPANDER REMOVAL;  Surgeon: Estefana Reichert, DO;  Location: [facility] ;  Service: Plastics;  Laterality: Left;   TISSUE EXPANDER PLACEMENT Left 09/29/2013   Procedure: TISSUE EXPANDER;  Surgeon: Estefana Reichert, DO;  Location: [facility];  Service: Plastics;  Laterality: Left;   TOTAL ABDOMINAL HYSTERECTOMY W/ BILATERAL SALPINGOOPHORECTOMY  03/28/2004   TOTAL MASTECTOMY  12/31/2012   Procedure: TOTAL MASTECTOMY;  Surgeon: Donnice Bury, MD;  Location:  SURGERY CENTER;  Service: General;  Laterality: Bilateral;  bilateral total mastectomies   VEIN SURGERY     removed from leg due to clots during pregnancy    Family History  Problem Relation Age of Onset   Cancer Mother 76  uterine, mult myeloma   CVA Father    Cancer Sister 66       breast   Leukemia Sister        CLL   Cancer Maternal Grandmother 81       breast   Celiac disease Daughter    Migraines Daughter     Social:  reports that she has never smoked. She has never used smokeless tobacco. She reports that she does not currently use alcohol . She reports that she does not use drugs.  Allergies:  Allergies  Allergen Reactions   Betadine [Povidone Iodine] Anaphylaxis   Doxycycline  Rash   Iodine Anaphylaxis   Shellfish-Derived Products Anaphylaxis   Gluten Meal     Other Reaction(s): Unknown   Omeprazole      Other Reaction(s): Unknown   Povidone-Iodine Other (See Comments)    Rosuvastatin  Calcium  Other (See Comments)    GI symptoms   Amoxicillin  Rash   Ginger Rash   Penicillins Rash   Sulfamethoxazole-Trimethoprim  Rash    Medications: I have reviewed the patient's current medications.  No results found. However, due to the size of the patient record, not all encounters were searched. Please check Results Review for a complete set of results.  No results found.   PE Blood pressure (!) 152/91, pulse 70, temperature 97.9 F (36.6 C), temperature source Oral, resp. rate 16, SpO2 99%. Constitutional: NAD; conversant Eyes: Moist conjunctiva; no lid lag; anicteric Lungs: Normal respiratory effort CV: RRR GI: Abd soft, NT/ND; no palpable hepatosplenomegaly Psychiatric: Appropriate affect  No results found. However, due to the size of the patient record, not all encounters were searched. Please check Results Review for a complete set of results.  No results found.  A/P: Jane Gutierrez is an 68 y.o. female with hx of HTN, hypothyroidism, history of remote cervical cancer; breast cancer (2013) here for nopw surgery for suspected biliary hyperkinesia  RUQ US  negative for stones or wall abnormalities; CBD 2 mm HIDA scan demonstrated gallbladder EF 86%  -Her symptoms are quite classic for biliary in etiology with the absence of stones, increasing likelihood of this actually being biliary hyperkinesia with associated symptoms  -The anatomy and physiology of the hepatobiliary system was discussed with the patient with associated pictures. We also spent time reviewing the pathophysiology of gallbladder disease. -The options for treatment were discussed including ongoing observation which carries some risk of subsequent gallbladder complications (infection, pancreatitis, choledocholithiasis, etc). We reviewed surgery as the most definitive treatment option moving forward - laparoscopic cholecystectomy. -The planned procedure, material risks (including, but not limited  to, pain-risks of persistent abdominal symptoms despite surgery being upwards of 20 to 30%, bleeding, infection, scarring, need for blood transfusion, damage to surrounding structures- blood vessels/nerves/viscus/organs, damage to bile duct, bile leak, chronic diarrhea, conversion to a 'subtotal' cholecystectomy and general expectations therein, post cholecystectomy diarrhea, need for additional procedures, hernia, worsening of pre-existing medical conditions, pancreatitis, pneumonia, heart attack, stroke, death) benefits and alternatives to surgery were discussed at length. I noted a good probability that the procedure would help improve their symptoms. The patient's questions were answered to her satisfaction, she voiced understanding and they elected to proceed with surgery. Additionally, we discussed typical postoperative expectations and the recovery process. We discussed that in the setting of her other GI related issues including IBS, there is certainly a possibility that with this procedure, she may have persistent symptoms. We also discussed that many of her IBS related symptoms would persist despite the surgery. We also  discussed a potential possibility for chronic diarrhea with cholecystectomy.  Lonni Pizza, MD [facility]

## 2024-06-28 NOTE — Anesthesia Preprocedure Evaluation (Addendum)
 Anesthesia Evaluation  Patient identified by MRN, date of birth, ID band Patient awake    Reviewed: Allergy & Precautions, NPO status , Patient's Chart, lab work & pertinent test results  Airway Mallampati: I  TM Distance: >3 FB Neck ROM: Full    Dental  (+) Teeth Intact, Dental Advisory Given   Pulmonary asthma    breath sounds clear to auscultation       Cardiovascular hypertension, Pt. on medications  Rhythm:Regular Rate:Normal     Neuro/Psych  Headaches PSYCHIATRIC DISORDERS Anxiety      Neuromuscular disease    GI/Hepatic Neg liver ROS,GERD  Medicated,,  Endo/Other  Hypothyroidism    Renal/GU negative Renal ROS     Musculoskeletal  (+) Arthritis ,    Abdominal   Peds  Hematology  (+) Blood dyscrasia, anemia   Anesthesia Other Findings   Reproductive/Obstetrics                              Anesthesia Physical Anesthesia Plan  ASA: 2  Anesthesia Plan: General   Post-op Pain Management: Tylenol  PO (pre-op)* and Toradol  IV (intra-op)*   Induction: Intravenous  PONV Risk Score and Plan: 4 or greater and Ondansetron , Dexamethasone , Midazolam  and Scopolamine  patch - Pre-op  Airway Management Planned: Oral ETT  Additional Equipment: None  Intra-op Plan:   Post-operative Plan: Extubation in OR  Informed Consent: I have reviewed the patients History and Physical, chart, labs and discussed the procedure including the risks, benefits and alternatives for the proposed anesthesia with the patient or authorized representative who has indicated his/her understanding and acceptance.     Dental advisory given  Plan Discussed with: CRNA  Anesthesia Plan Comments:          Anesthesia Quick Evaluation

## 2024-06-29 ENCOUNTER — Encounter (HOSPITAL_COMMUNITY): Payer: Self-pay | Admitting: Surgery

## 2024-06-29 ENCOUNTER — Other Ambulatory Visit (HOSPITAL_BASED_OUTPATIENT_CLINIC_OR_DEPARTMENT_OTHER): Payer: Self-pay

## 2024-06-29 LAB — SURGICAL PATHOLOGY

## 2024-06-29 MED ORDER — MOXIFLOXACIN HCL 0.5 % OP SOLN
1.0000 [drp] | Freq: Four times a day (QID) | OPHTHALMIC | 2 refills | Status: AC
  Filled 2024-06-29: qty 3, 8d supply, fill #0

## 2024-07-02 ENCOUNTER — Ambulatory Visit: Payer: Self-pay | Admitting: Surgery

## 2024-07-03 ENCOUNTER — Other Ambulatory Visit (HOSPITAL_BASED_OUTPATIENT_CLINIC_OR_DEPARTMENT_OTHER): Payer: Self-pay

## 2024-07-03 MED ORDER — AZASITE 1 % OP SOLN
1.0000 [drp] | Freq: Two times a day (BID) | OPHTHALMIC | 1 refills | Status: AC
  Filled 2024-07-03: qty 2.5, 25d supply, fill #0

## 2024-07-05 ENCOUNTER — Other Ambulatory Visit (HOSPITAL_BASED_OUTPATIENT_CLINIC_OR_DEPARTMENT_OTHER): Payer: Self-pay

## 2024-07-05 DIAGNOSIS — H2513 Age-related nuclear cataract, bilateral: Secondary | ICD-10-CM | POA: Diagnosis not present

## 2024-07-05 DIAGNOSIS — H10402 Unspecified chronic conjunctivitis, left eye: Secondary | ICD-10-CM | POA: Diagnosis not present

## 2024-07-05 DIAGNOSIS — H0288A Meibomian gland dysfunction right eye, upper and lower eyelids: Secondary | ICD-10-CM | POA: Diagnosis not present

## 2024-07-05 DIAGNOSIS — H0288B Meibomian gland dysfunction left eye, upper and lower eyelids: Secondary | ICD-10-CM | POA: Diagnosis not present

## 2024-07-06 ENCOUNTER — Other Ambulatory Visit (HOSPITAL_BASED_OUTPATIENT_CLINIC_OR_DEPARTMENT_OTHER): Payer: Self-pay

## 2024-07-12 ENCOUNTER — Encounter (HOSPITAL_BASED_OUTPATIENT_CLINIC_OR_DEPARTMENT_OTHER): Payer: Self-pay

## 2024-07-12 DIAGNOSIS — R002 Palpitations: Secondary | ICD-10-CM

## 2024-07-13 ENCOUNTER — Other Ambulatory Visit (HOSPITAL_BASED_OUTPATIENT_CLINIC_OR_DEPARTMENT_OTHER): Payer: Self-pay

## 2024-07-13 DIAGNOSIS — F909 Attention-deficit hyperactivity disorder, unspecified type: Secondary | ICD-10-CM | POA: Diagnosis not present

## 2024-07-13 DIAGNOSIS — R1011 Right upper quadrant pain: Secondary | ICD-10-CM | POA: Diagnosis not present

## 2024-07-13 DIAGNOSIS — H1089 Other conjunctivitis: Secondary | ICD-10-CM | POA: Diagnosis not present

## 2024-07-13 DIAGNOSIS — Z9049 Acquired absence of other specified parts of digestive tract: Secondary | ICD-10-CM | POA: Diagnosis not present

## 2024-07-13 MED ORDER — TOBRAMYCIN 0.3 % OP SOLN
1.0000 [drp] | OPHTHALMIC | 0 refills | Status: AC
  Filled 2024-07-13: qty 5, 17d supply, fill #0

## 2024-07-13 MED ORDER — CIPROFLOXACIN HCL 0.3 % OP SOLN
1.0000 [drp] | Freq: Four times a day (QID) | OPHTHALMIC | 0 refills | Status: DC
  Filled 2024-07-13: qty 5, 25d supply, fill #0

## 2024-07-16 NOTE — Telephone Encounter (Signed)
 Recommend mail 14 day ZIO monitor.   Last echocardiogram 2023 showed no cardiomyopathy and was unremarkable. No indication for repeat echocardiogram at this time.   Taziyah Iannuzzi S Aneyah Lortz, NP

## 2024-07-19 ENCOUNTER — Ambulatory Visit: Attending: Family

## 2024-07-19 ENCOUNTER — Inpatient Hospital Stay: Attending: Adult Health | Admitting: Genetic Counselor

## 2024-07-19 ENCOUNTER — Encounter: Payer: Self-pay | Admitting: Genetic Counselor

## 2024-07-19 ENCOUNTER — Inpatient Hospital Stay

## 2024-07-19 DIAGNOSIS — Z806 Family history of leukemia: Secondary | ICD-10-CM | POA: Diagnosis not present

## 2024-07-19 DIAGNOSIS — Z800 Family history of malignant neoplasm of digestive organs: Secondary | ICD-10-CM

## 2024-07-19 DIAGNOSIS — C50412 Malignant neoplasm of upper-outer quadrant of left female breast: Secondary | ICD-10-CM | POA: Diagnosis not present

## 2024-07-19 DIAGNOSIS — Z803 Family history of malignant neoplasm of breast: Secondary | ICD-10-CM | POA: Diagnosis not present

## 2024-07-19 DIAGNOSIS — Z1589 Genetic susceptibility to other disease: Secondary | ICD-10-CM | POA: Diagnosis not present

## 2024-07-19 DIAGNOSIS — R002 Palpitations: Secondary | ICD-10-CM

## 2024-07-19 DIAGNOSIS — Z170 Estrogen receptor positive status [ER+]: Secondary | ICD-10-CM

## 2024-07-19 DIAGNOSIS — Z8049 Family history of malignant neoplasm of other genital organs: Secondary | ICD-10-CM

## 2024-07-19 LAB — GENETIC SCREENING ORDER

## 2024-07-19 NOTE — Progress Notes (Unsigned)
 Enrolled for [employer] to mail a ZIO XT long term holter monitor to the patients address on file.   Dr. Jodelle Red to read.

## 2024-07-19 NOTE — Progress Notes (Addendum)
 REFERRING PROVIDER: Nichole Senior, MD [address] [place],  [place] [address]  PRIMARY PROVIDER:  Nichole Senior, MD  PRIMARY REASON FOR VISIT:  1. Malignant neoplasm of upper-outer quadrant of left breast in female, estrogen receptor positive (HCC)   2. Family history of malignant neoplasm of breast   3. Family history of malignant neoplasm of gastrointestinal tract   4. Family history of uterine cancer   5. Family history of CLL (chronic lymphoid leukemia)   6. CHEK2 gene mutation positive      HISTORY OF PRESENT ILLNESS:   Jane Gutierrez, a 68 y.o. female, was seen for a [facility] cancer genetics consultation at the request of Dr. Nichole due to a personal and family history of cancer.  Jane Gutierrez presents to clinic today to discuss the possibility of a hereditary predisposition to cancer, to discuss genetic testing, and to further clarify her future cancer risks, as well as potential cancer risks for family members. She consented to a joint visit with her twin sister, [name].   In 2013/14, at the age of 46, [name] was diagnosed with bilateral breast cancer. She was treated with bilateral mastectomies, adjuvant chemo, and anti-estrogen therapy. She reports a diagnosis of cervical cancer in her 30s and basal cell carcinoma on her face at age 49.   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 She had the BreastNext panel which included the following genes: ATM, BARD1, BRCA1, BRCA2, BRIP1, CDH1, CHEK2, MRE11A, MUTYH, NBN, NF1, PALB2, PTEN, RAD50, RAD51C, RAD51D, STK11, TP53.    CANCER HISTORY:  Oncology History  Breast cancer, left breast (HCC) (Resolved)   Initial Diagnosis   Breast cancer, left breast   12/04/2012 Initial Biopsy   Left breast needle core biopsy (UOQ): Grade 3, DCIS with necrosis and calcs. ER+ (100%), PR+ (86%).    Breast cancer of upper-outer quadrant of left female breast (HCC)  12/04/2012 Initial  Biopsy   LEFT breast needle core biopsy (UOQ): Grade 3, DCIS with necrosis and calcs. ER+ (100%), PR+ (86%).    12/14/2012 Breast MRI   LEFT breast: Clumped nodular enhancement measuring 7.2 x 3.2 x 2.6 cm. Also associated post-biopsy change. RIGHT breast: At 12 o'clock location, there is 6mm slightly irregular mass-like area of abnml enhancement. No lymphadenopathy.    12/25/2012 Initial Biopsy   RIGHT breast needle core biopsy (upper centeral): Grade 1-2, IDC, DCIS with calcs. ER+ (100%), PR+ (100%), HER2- (ratio 1.23). Ki67 4%.    12/25/2012 Initial Diagnosis   Bilateral breast cancer   12/31/2012 Surgery   Bilateral mastectomies with bilat SLNB Viktor[place]). LEFT: Grade 2, IDC, 2.3 cm. (+) LVI. Grade 3 DCIS w/comedonecrosis & calcs. (-) margins. 1 left ax SLN neg.  ER+ (99%), PR+ (10%), HER2 repeated and (+) ratio (6.81). Ki67 46%. RIGHT: Benign, 1 SLN neg.   12/31/2012 Pathologic Stage   LEFT breast: pT2, pN0, pMx. Stage IIA.    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 EF 55-60%   02/04/2013 - 05/13/2013 Adjuvant Chemotherapy   Taxotere /Carbo/Herceptin  q 3 wks x 5 cycles completed. (planned for 6 cycles but d/c'd early due to pt wishes and recent infection).    05/2013 -  Anti-estrogen oral therapy   Attempted to take Letrozole , Aromasin , & Tamoxifen  but patient could not tolerate any anti-estrogen therapy due to severe depressive symptoms and joint/muscle aches & pains.  No longer taking anti-estrogen therapy.  09/29/2013 Surgery   LEFT breast reconstruction with lat flap and placement of tissue expander ([facility]).    11/26/2013 Imaging   CT chest: No definite CT findings for chest wall recurrence and no suspicious supraclavicular or axillary adenopathy.  Negative for metastatic disease in lungs or bones.     - 02/16/2014 Adjuvant Chemotherapy   Herceptin  maintenance x 1 year completed.     03/09/2014 Surgery   Removal of bilateral tissue expanders and placement of bilateral breast implants ([facility]).    03/09/2014 Procedure   Right scar biopsy: No malignancy & Left breast capsule biopsy: No evidence of malignancy.    08/22/2014 Echocardiogram   Post-treatment. EF 55-60%   06/30/2015 Survivorship   Survivorship Care Plan given to patient and reviewed with her during in-person visit.       RISK FACTORS:  TAH/BSO 2005  Colonoscopy, four polyps total   Past Medical History:  Diagnosis Date   Abnormally small mouth    ADD (attention deficit disorder)    ADHD    Allergy    Anxiety    pt reported she doesn't have anxiety   Arthritis    in hands   Asthma    prn inhaler   Blood transfusion without reported diagnosis    Breast cancer (HCC)    Dental crowns present    Difficulty swallowing pills    GERD (gastroesophageal reflux disease)    History of breast cancer    History of cervical cancer    History of palpitations    last echo 08/22/2014   Hypertension    Hypothyroidism    IBS (irritable bowel syndrome)    Vasculitis (HCC)    Dr. Ivin last year dx    Past Surgical History:  Procedure Laterality Date   ABDOMINAL HYSTERECTOMY     ABDOMINOPLASTY  03/28/2004   AXILLARY SENTINEL NODE BIOPSY  12/31/2012   Procedure: AXILLARY SENTINEL NODE BIOPSY;  Surgeon: Donnice Bury, MD;  Location: [facility];  Service: General;  Laterality: Bilateral;  bilateral sentinel node   BRAIN SURGERY     removal benign frontal lobe cyst in 1987   BREAST ENHANCEMENT SURGERY Bilateral ~ 2008   BREAST IMPLANT EXCHANGE Bilateral 12/11/2017   Procedure: BILATERAL IMPLANT EXCHANGE;  Surgeon: Lowery Estefana RAMAN, DO;  Location: [facility];  Service: Plastics;  Laterality: Bilateral;   BREAST RECONSTRUCTION WITH PLACEMENT OF TISSUE EXPANDER AND FLEX HD (ACELLULAR HYDRATED DERMIS)  12/31/2012   Procedure: BREAST RECONSTRUCTION WITH PLACEMENT OF TISSUE  EXPANDER AND FLEX HD (ACELLULAR HYDRATED DERMIS);  Surgeon: Estefana Reichert, DO;  Location: [facility];  Service: Plastics;  Laterality: Bilateral;  bilateral immediate breast reconstruction with expanders and flex hd    CAPSULOTOMY Left 09/01/2014   Procedure: CAPSULOTOMY OF LEFT BREAST WITH LIPOFILLING FOR SYMMETRY;  Surgeon: Estefana Reichert, DO;  Location: [facility];  Service: Plastics;  Laterality: Left;   CERVICAL CERCLAGE     CHOLECYSTECTOMY N/A 06/28/2024   Procedure: LAPAROSCOPIC CHOLECYSTECTOMY;  Surgeon: Teresa Lonni HERO, MD;  Location: [facility];  Service: General;  Laterality: N/A;  Indocyanine ICG Dye   DIAGNOSTIC LAPAROSCOPY  05/12/2003   right partial salpingectomy; lysis paraovarian adhesions left   DILATION AND CURETTAGE OF UTERUS  2005   ENDOMETRIAL FULGURATION  05/12/2003   FACIAL COSMETIC SURGERY  ~ 2010   FOOT SURGERY Bilateral 07/2012   shorten toe   GYNECOLOGIC CRYOSURGERY  1998   INCISION AND DRAINAGE OF WOUND Left 04/28/2013  Procedure: IRRIGATION AND DEBRIDEMENT LEFT BREAST WOUND, POSSIBLE CLOSURE OF WOUND WITH DRAIN PLACEMENT;  Surgeon: Estefana Reichert, DO;  Location: [facility];  Service: Plastics;  Laterality: Left;   LATISSIMUS FLAP TO BREAST Left 09/29/2013   Procedure: LATISSIMUS FLAP TO BREAST WITH TISSUE EXPANDER PLACEMENT FOR LEFT BREAST RECONSTRUCTION;  Surgeon: Estefana Reichert, DO;  Location: [facility];  Service: Plastics;  Laterality: Left;   LIPECTOMY Bilateral 03/28/2004   hip   LIPOSUCTION Bilateral 03/09/2014   Procedure: LIPOSUCTION ABDOMEN W/ LIPOFILLING LATERAL BILATERAL BREAST;  Surgeon: Estefana Reichert, DO;  Location: [facility];  Service: Plastics;  Laterality: Bilateral;  LIPOSUCTION NECK   LIPOSUCTION WITH LIPOFILLING Bilateral 09/01/2014   Procedure: LIPOSUCTION WITH LIPOFILLING;  Surgeon: Estefana Reichert, DO;  Location: [facility];  Service: Plastics;  Laterality: Bilateral;   LYSIS  OF ADHESION  03/28/2004   pelvic   MASTECTOMY     PORT-A-CATH REMOVAL Right 03/09/2014   Procedure: REMOVAL PORT-A-CATH;  Surgeon: Estefana Reichert, DO;  Location: [facility];  Service: Plastics;  Laterality: Right;   PORTACATH PLACEMENT  01/27/2013   Procedure: INSERTION PORT-A-CATH;  Surgeon: Donnice Bury, MD;  Location: [facility];  Service: General;  Laterality: Right;   RADIOFREQUENCY ABLATION NERVES  05/12/2003   uterosacral nerve ablation   REMOVAL OF BILATERAL TISSUE EXPANDERS WITH PLACEMENT OF BILATERAL BREAST IMPLANTS Bilateral 03/09/2014   Procedure: REMOVAL OF BILATERAL TISSUE EXPANDERS WITH PLACEMENT OF BILATERAL BREAST IMPLANTS;  Surgeon: Estefana Reichert, DO;  Location: [facility];  Service: Plastics;  Laterality: Bilateral;   TEE WITHOUT CARDIOVERSION N/A 06/04/2013   Procedure: TRANSESOPHAGEAL ECHOCARDIOGRAM (TEE);  Surgeon: Toribio JONELLE Fuel, MD;  Location: [facility];  Service: Cardiovascular;  Laterality: N/A;   TISSUE EXPANDER PLACEMENT Left 06/16/2013   Procedure: LEFT BREAST TISSUE EXPANDER REMOVAL;  Surgeon: Estefana Reichert, DO;  Location[facility];  Service: Plastics;  Laterality: Left;   TISSUE EXPANDER PLACEMENT Left 09/29/2013   Procedure: TISSUE EXPANDER;  Surgeon: Estefana Reichert, DO;  Location: [facility];  Service: Plastics;  Laterality: Left;   TOTAL ABDOMINAL HYSTERECTOMY W/ BILATERAL SALPINGOOPHORECTOMY  03/28/2004   TOTAL MASTECTOMY  12/31/2012   Procedure: TOTAL MASTECTOMY;  Surgeon: Donnice Bury, MD;  Location: [facility];  Service: General;  Laterality: Bilateral;  bilateral total mastectomies   VEIN SURGERY     removed from leg due to clots during pregnancy    Social History   Socioeconomic History   Marital status: Married    Spouse name: Not on file   Number of children: 1   Years of education: Not on file   Highest education level: Not on file  Occupational History    Not on file  Tobacco Use   Smoking status: Never   Smokeless tobacco: Never  Vaping Use   Vaping status: Never Used  Substance and Sexual Activity   Alcohol  use: Not Currently   Drug use: No   Sexual activity: Yes  Other Topics Concern   Not on file  Social History Narrative   Not on file   Social Drivers of Health   Financial Resource Strain: Not on file  Food Insecurity: Not on file  Transportation Needs: Not on file  Physical Activity: Not on file  Stress: Not on file  Social Connections: Unknown (05/07/2022)   Received from [facility]   Social Network    Social Network: Not on file     FAMILY HISTORY:  We  obtained a detailed, 4-generation family history.  Significant diagnoses are listed below: Family History  Problem Relation Age of Onset   Multiple myeloma Mother 36   Uterine cancer Mother 75   CVA Father    Breast cancer Sister 14       CHEK2+   Melanoma Sister 32   Skin cancer Sister 17 - 65       BCC, SCCx4   Leukemia Sister        CLL   Breast cancer Paternal Aunt    Pancreatic cancer Paternal Uncle    Breast cancer Maternal Grandmother 19   Celiac disease Daughter    Migraines Daughter    Other Daughter        MSH6+   Leukemia Maternal Cousin 22       CLL   Breast cancer Paternal Cousin    Lung cancer Paternal Cousin    Colon cancer Paternal Cousin     Ms. Brunelle is aware of previous family history of genetic testing for hereditary cancer risks. Patient's maternal ancestors are of [place] descent, and paternal ancestors are of [place] descent. There is no reported Ashkenazi Jewish ancestry.     GENETIC COUNSELING ASSESSMENT: Ms. Ertel is a 68 y.o. female with a personal and family history of cancer and a known CHEK2 c.1100delC pathogenic variant. She presents to discuss options for updated testing, given that her daughter is reported to carry and MSH6 variant assocaited with Lynch syndrome. The MSH6 gene was not included in the 2014 test.    DISCUSSION: We discussed that 5 - 10% of cancer is hereditary. There are other genes that can be associated with hereditary cancer syndromes that were not included in her 2014 test.  We discussed that testing is beneficial for several reasons including knowing how to follow individuals after completing their treatment, identifying whether potential treatment options would be beneficial, and understanding if other family members could be at risk for cancer and allowing them to undergo genetic testing.   We discussed that pathogenic variants or mutations in MSH6 are associated with a condition called Lynch syndrome. Lynch syndrome increases risk for colorectal, endometrial, ovarian, urinary tract, and other cancers. Lynch syndrome can also be caused by mutations in MLH1, MSH2, PMS2, EPCAM. We reviewed the risks and recommendations for individuals identified to have MSH6 pathogenic variants per NCCN.   Hereditary predisposition to cancer due to pathogenic variants in the MSH6 gene has autosomal dominant inheritance. This means that an individual with a pathogenic variant has a 50% chance of passing the condition on to their children. Identification of a pathogenic variant allows for the recognition of at-risk relatives who can pursue testing for the familial variant.  We reviewed the characteristics, features and inheritance patterns of hereditary cancer syndromes. We also discussed genetic testing, including the appropriate family members to test, the process of testing, insurance coverage and turn-around-time for results. We discussed the implications of a negative, positive, carrier and/or variant of uncertain significant result. We recommended Ms. Neer pursue updated genetic testing due to the reported changes in family history.   Ms. Zachow  was offered a common hereditary cancer panel (40 genes) and an expanded pan-cancer panel (77 genes). Ms. Marszalek was informed of the benefits and limitations of  each panel, including that expanded pan-cancer panels contain genes that do not have clear management guidelines at this point in time.  We also discussed that as the number of genes included on a panel increases, the chances of  variants of uncertain significance increases. After considering the benefits and limitations of each gene panel, Ms. Wooton elected to have [facility] CancerNext-Expanded +RNAinsight panel. The CancerNext-Expanded gene panel offered by [facility] and includes sequencing, rearrangement, and RNA analysis for the following 77 genes: AIP, ALK, APC, ATM, AXIN2, BAP1, BARD1, BMPR1A, BRCA1, BRCA2, BRIP1, CDC73, CDH1, CDK4, CDKN1B, CDKN2A, CEBPA, CHEK2, CTNNA1, DDX41, DICER1, EGFR, EPCAM, ETV6, FH, FLCN, GATA2, GREM1, HOXB13, KIT, LZTR1, MAX, MBD4, MEN1, MET, MITF, MLH1, MSH2, MSH3, MSH6, MUTYH, NF1, NF2, NTHL1, PALB2, PDGFRA, PHOX2B, PMS2, POLD1, POLE, POT1, PRKAR1A, PTCH1, PTEN, RAD51C, RAD51D, RB1, RET, RPS20, RUNX1, SDHA, SDHAF2, SDHB, SDHC, SDHD, SMAD4, SMARCA4, SMARCB1, SMARCE1, STK11, SUFU, TMEM127, TP53, TSC1, TSC2, VHL, WT1.   Based on Ms. Kutsch personal and family history of cancer, she meets medical criteria for genetic testing. Despite that she meets criteria, she may still have an out of pocket cost. We discussed that if her out of pocket cost for testing is over $100, the laboratory should contact them to discuss self-pay prices, patient pay assistance programs, if applicable, and other billing options.  PLAN: After considering the risks, benefits, and limitations, Ms. Hartgrove provided informed consent to pursue genetic testing and the blood sample was sent to [facility] for analysis of the CancerNext-Expanded +RNAinsight test. Results should be available within approximately 2-3 weeks' time, at which point they will be disclosed by telephone to Ms. Suchy, as will any additional recommendations warranted by these results. Ms. Min will receive a summary of  her genetic counseling visit and a copy of her results once available. This information will also be available in Epic.   Lastly, we encouraged Ms. Bencomo to remain in contact with cancer genetics annually so that we can continuously update the family history and inform her of any changes in cancer genetics and testing that may be of benefit for this family.   Ms. Bencivenga questions were answered to her satisfaction today. Our contact information was provided should additional questions or concerns arise. Thank you for the referral and allowing us  to share in the care of your patient.   Burnard Ogren, MS, [facility] Licensed, Retail [email or web address] phone: [phone]   30 minutes were spent on the date of the encounter in service to the patient including preparation, face-to-face consultation, documentation and care coordination.  The patient brought her sister.  Drs. Gudena and/or Lanny were available to discuss this case as needed.   _______________________________________________________________________ For Office Staff:  Number of people involved in session: 2 Was an Intern/ student involved with case: no

## 2024-07-22 ENCOUNTER — Telehealth: Payer: Self-pay | Admitting: Genetic Counselor

## 2024-07-26 ENCOUNTER — Other Ambulatory Visit: Payer: Self-pay

## 2024-07-26 ENCOUNTER — Other Ambulatory Visit (HOSPITAL_BASED_OUTPATIENT_CLINIC_OR_DEPARTMENT_OTHER): Payer: Self-pay

## 2024-07-26 MED ORDER — VALACYCLOVIR HCL 500 MG PO TABS
500.0000 mg | ORAL_TABLET | Freq: Two times a day (BID) | ORAL | 0 refills | Status: AC
  Filled 2024-07-26: qty 6, 3d supply, fill #0

## 2024-07-30 ENCOUNTER — Ambulatory Visit: Payer: Self-pay | Admitting: Genetic Counselor

## 2024-07-30 ENCOUNTER — Telehealth: Payer: Self-pay | Admitting: Genetic Counselor

## 2024-07-30 DIAGNOSIS — Z1589 Genetic susceptibility to other disease: Secondary | ICD-10-CM

## 2024-07-30 NOTE — Progress Notes (Signed)
 GENETIC TEST RESULTS  Patient Name: Jane Gutierrez Patient Age: 68 y.o. Encounter Date: 07/30/2024  Referring Provider: Nichole Senior, MD   Jane Gutierrez was seen in the Cancer Genetics clinic on 07/19/24 due to a personal and family history of cancer and concern regarding a hereditary predisposition to cancer in the family. Please refer to the prior Genetics clinic note for more information regarding Jane Gutierrez medical and family histories and our assessment at the time. Results disclosed by phone 07/30/24.   FAMILY HISTORY:  We obtained a detailed, 4-generation family history.  Significant diagnoses are listed below: Family History  Problem Relation Age of Onset   Multiple myeloma Mother 11   Uterine cancer Mother 30   CVA Father    Breast cancer Sister 20       CHEK2+   Melanoma Sister 68   Skin cancer Sister 22 - 61       BCC, SCCx4   Leukemia Sister        CLL   Breast cancer Paternal Aunt    Pancreatic cancer Paternal Uncle    Breast cancer Maternal Grandmother 39   Celiac disease Daughter    Migraines Daughter    Other Daughter        MSH6+   Leukemia Maternal Cousin 40       CLL   Breast cancer Paternal Cousin    Lung cancer Paternal Cousin    Colon cancer Paternal Cousin       Jane Gutierrez is aware of previous family history of genetic testing for hereditary cancer risks, reporting her daughter was diagnosed with a c.3749C>G variant in MSH6. Daughter's report was not available for review. Patient's maternal ancestors are of [place] descent, and paternal ancestors are of English descent. There is no reported Ashkenazi Jewish ancestry.      GENETIC TEST RESULTS: Jane Gutierrez had genetic testing with [facility] CancerNext-Expanded +RNAinsight panel that identified a pathogenic mutation in CHEK2, c.1100delC. This is the same mutation identified on testing in 2014. Testing did not identify any other pathogenic variants in the genes included on this test. The CancerNext-Expanded  gene panel offered by [facility] and includes sequencing, rearrangement, and RNA analysis for the following 77 genes: AIP, ALK, APC, ATM, AXIN2, BAP1, BARD1, BMPR1A, BRCA1, BRCA2, BRIP1, CDC73, CDH1, CDK4, CDKN1B, CDKN2A, CEBPA, CHEK2, CTNNA1, DDX41, DICER1, EGFR, EPCAM, ETV6, FH, FLCN, GATA2, GREM1, HOXB13, KIT, LZTR1, MAX, MBD4, MEN1, MET, MITF, MLH1, MSH2, MSH3, MSH6, MUTYH, NF1, NF2, NTHL1, PALB2, PDGFRA, PHOX2B, PMS2, POLD1, POLE, POT1, PRKAR1A, PTCH1, PTEN, RAD51C, RAD51D, RB1, RET, RPS20, RUNX1, SDHA, SDHAF2, SDHB, SDHC, SDHD, SMAD4, SMARCA4, SMARCB1, SMARCE1, STK11, SUFU, TMEM127, TP53, TSC1, TSC2, VHL, WT1.   The test report has been scanned into EPIC and is located under the Molecular Pathology section of the Results Review tab.  A portion of the result report is included below for reference. Genetic testing reported out on 07/30/23.     We reviewed the importance of identifying more information about her daughter's genetic test results. It was reported by Jane Gutierrez that her daughter has a c.3749C>G variant in MSH6, consistent with a diagnosis of Lynch syndrome. However, she was unable to obtain copies of the full report or more information on the full mutation. Therefore, Jane Gutierrez was unable to comment on the presence or absence of this variant on Jane Gutierrez testing. Most likely testing would have picked up a pathogenic MSH6 variant, but we cannot eliminate the possibility that a variant was missed. Encourage Ms.  Gutierrez to contact us  if she is able to obtain additional information. If her daughter is unable to obtain copies of the report, she can contact our office for assistance.   Cancer Risks for CHEK2: Women have a 23-27% lifetime risk of breast cancer. In women with a history of breast cancer, the 10-year cumulative risk for contralateral breast cancer is 6-8%.  Men are thought to be at an increased risk of prostate cancer. The exact risk figure is unknown at this time. Data suggests a  possible increased risk for ovarian, endometrial, thyroid , and other types of cancer    Research is continuing to help learn more about the cancers associated with CHEK2 pathogenic variants and what the exact risks are to develop these cancers.  Management Recommendations:  Breast Cancer Screening/Risk Reduction:  Women: Breast cancer screening includes: Breast awareness beginning at age 25 Monthly self-breast examination beginning at age 36 Clinical breast examination every 6-12 months beginning at age 60 or at the age of the earliest diagnosed breast cancer in the family, if onset was before age 34 Annual mammogram with consideration of tomosynthesis starting at age 54 or 10 years prior to the youngest age of diagnosis, whichever comes first Consider breast MRI with contrast starting at age 49-35 Evidence is insufficient for a prophylactic risk-reducing mastectomy, manage based on family history  For patients who are treated for breast cancer and have not had bilateral mastectomy, screening should continue as described  Prostate Cancer Screening: Consider beginning annual PSA blood test and digital rectal exams at age 27.   This information is based on current understanding of the gene and may change in the future.   Implications for Family Members: Hereditary predisposition to cancer due to pathogenic variants in the CHEK2 gene has autosomal dominant inheritance. This means that an individual with a pathogenic variant has a 50% chance of passing the condition on to his/her offspring. Identification of a pathogenic variant allows for the recognition of at-risk relatives who can pursue testing for the familial variant.   Family members are encouraged to consider genetic testing for this familial pathogenic variant. As there are generally no childhood cancer risks associated with pathogenic variants in the CHEK2 gene, individuals in the family are not recommended to have testing until  they reach at least 68 years of age. Complimentary testing for the familial variant is available for 90 days. They may contact our office at [phone] for more information or to schedule an appointment. Family members who live outside of the area are encouraged to find a genetic counselor in their area by visiting: [email or web address].   Resources: FORCE (Facing Our Risk of Cancer Empowered) is a resource for those with a hereditary predisposition to develop cancer.  FORCE provides information about risk reduction, advocacy, legislation, and clinical trials.  Additionally, FORCE provides a platform for collaboration and support; which includes: peer navigation, message boards, local support groups, a toll-free helpline, research registry and recruitment, advocate training, published medical research, webinars, brochures, mastectomy photos, and more.  For more information, visit www.facingourrisk.org   PLAN:  We encourage Jane Gutierrez to share these results with her health care providers and family members.    We encouraged Jane Gutierrez to remain in contact with us  on an annual basis so we can update her personal and family histories, and let her know of advances in cancer genetics that may benefit the family. Our contact number was provided. Jane Gutierrez questions were answered to her satisfaction today, and she knows  she is welcome to call anytime with additional questions.      Burnard Ogren, MS, [facility] Licensed, Retail [email or web address] phone: [phone]

## 2024-07-30 NOTE — Telephone Encounter (Signed)
 I spoke to Jane Gutierrez to review results of genetic testing. Genetic testing completed with [facility] CancerNext-Expanded +RNAinsight panel. 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 This is the same mutation identified on previous testing for 2014. Encouraged Jane Gutierrez to get a full copy of her daughter's MSH6 test result. She was able to share that her daughter has an MSH6 'c.3749C>G' variant, but Jane Gutierrez was unable to comment on the presence or absence of this variant on Jane Gutierrez report, due to the limited information regarding the specific mutation. Identifying the p. Or a copy of the full report would be helpful to confirm that Jane Gutierrez does not carry the same mutation.   Please see counseling note for further detail on this result. Portion of result included below.

## 2024-08-09 DIAGNOSIS — R002 Palpitations: Secondary | ICD-10-CM | POA: Diagnosis not present

## 2024-08-11 ENCOUNTER — Other Ambulatory Visit (HOSPITAL_BASED_OUTPATIENT_CLINIC_OR_DEPARTMENT_OTHER): Payer: Self-pay

## 2024-08-16 ENCOUNTER — Other Ambulatory Visit (HOSPITAL_BASED_OUTPATIENT_CLINIC_OR_DEPARTMENT_OTHER): Payer: Self-pay

## 2024-08-16 DIAGNOSIS — Z6825 Body mass index (BMI) 25.0-25.9, adult: Secondary | ICD-10-CM | POA: Diagnosis not present

## 2024-08-16 DIAGNOSIS — Z01419 Encounter for gynecological examination (general) (routine) without abnormal findings: Secondary | ICD-10-CM | POA: Diagnosis not present

## 2024-08-16 DIAGNOSIS — Z1272 Encounter for screening for malignant neoplasm of vagina: Secondary | ICD-10-CM | POA: Diagnosis not present

## 2024-08-16 DIAGNOSIS — B009 Herpesviral infection, unspecified: Secondary | ICD-10-CM | POA: Diagnosis not present

## 2024-08-16 DIAGNOSIS — N390 Urinary tract infection, site not specified: Secondary | ICD-10-CM | POA: Diagnosis not present

## 2024-08-16 MED ORDER — VALACYCLOVIR HCL 500 MG PO TABS
500.0000 mg | ORAL_TABLET | Freq: Every day | ORAL | 3 refills | Status: AC
  Filled 2024-08-16: qty 90, 90d supply, fill #0
  Filled 2024-09-10 – 2024-11-08 (×3): qty 90, 90d supply, fill #1

## 2024-08-16 MED ORDER — PHENAZOPYRIDINE HCL 200 MG PO TABS
200.0000 mg | ORAL_TABLET | Freq: Three times a day (TID) | ORAL | 2 refills | Status: AC
  Filled 2024-08-16: qty 6, 2d supply, fill #0
  Filled 2024-09-20: qty 6, 2d supply, fill #1
  Filled 2024-10-15: qty 6, 2d supply, fill #2

## 2024-08-27 ENCOUNTER — Other Ambulatory Visit (HOSPITAL_BASED_OUTPATIENT_CLINIC_OR_DEPARTMENT_OTHER): Payer: Self-pay

## 2024-08-27 MED ORDER — FLUZONE HIGH-DOSE 0.5 ML IM SUSY
0.5000 mL | PREFILLED_SYRINGE | Freq: Once | INTRAMUSCULAR | 0 refills | Status: AC
  Filled 2024-08-27: qty 0.5, 1d supply, fill #0

## 2024-09-08 DIAGNOSIS — I7300 Raynaud's syndrome without gangrene: Secondary | ICD-10-CM | POA: Diagnosis not present

## 2024-09-08 DIAGNOSIS — L959 Vasculitis limited to the skin, unspecified: Secondary | ICD-10-CM | POA: Diagnosis not present

## 2024-09-08 DIAGNOSIS — M3501 Sicca syndrome with keratoconjunctivitis: Secondary | ICD-10-CM | POA: Diagnosis not present

## 2024-09-08 DIAGNOSIS — Z6824 Body mass index (BMI) 24.0-24.9, adult: Secondary | ICD-10-CM | POA: Diagnosis not present

## 2024-09-08 DIAGNOSIS — K121 Other forms of stomatitis: Secondary | ICD-10-CM | POA: Diagnosis not present

## 2024-09-08 DIAGNOSIS — H209 Unspecified iridocyclitis: Secondary | ICD-10-CM | POA: Diagnosis not present

## 2024-09-08 DIAGNOSIS — M1991 Primary osteoarthritis, unspecified site: Secondary | ICD-10-CM | POA: Diagnosis not present

## 2024-09-11 ENCOUNTER — Encounter (HOSPITAL_COMMUNITY): Payer: Self-pay | Admitting: Pharmacist

## 2024-09-11 ENCOUNTER — Encounter (HOSPITAL_BASED_OUTPATIENT_CLINIC_OR_DEPARTMENT_OTHER): Payer: Self-pay

## 2024-09-11 ENCOUNTER — Other Ambulatory Visit (HOSPITAL_BASED_OUTPATIENT_CLINIC_OR_DEPARTMENT_OTHER): Payer: Self-pay

## 2024-09-11 ENCOUNTER — Other Ambulatory Visit: Payer: Self-pay

## 2024-09-13 ENCOUNTER — Other Ambulatory Visit (HOSPITAL_BASED_OUTPATIENT_CLINIC_OR_DEPARTMENT_OTHER): Payer: Self-pay

## 2024-09-14 ENCOUNTER — Other Ambulatory Visit (HOSPITAL_BASED_OUTPATIENT_CLINIC_OR_DEPARTMENT_OTHER): Payer: Self-pay

## 2024-09-14 ENCOUNTER — Other Ambulatory Visit: Payer: Self-pay

## 2024-09-14 ENCOUNTER — Ambulatory Visit (INDEPENDENT_AMBULATORY_CARE_PROVIDER_SITE_OTHER): Payer: Self-pay | Admitting: Plastic Surgery

## 2024-09-14 ENCOUNTER — Encounter: Payer: Self-pay | Admitting: Plastic Surgery

## 2024-09-14 VITALS — BP 155/84 | HR 85 | Ht 64.5 in | Wt 148.8 lb

## 2024-09-14 DIAGNOSIS — Z719 Counseling, unspecified: Secondary | ICD-10-CM

## 2024-09-14 MED ORDER — ACETAZOLAMIDE 125 MG PO TABS
ORAL_TABLET | ORAL | 0 refills | Status: DC
  Filled 2024-09-14: qty 20, 10d supply, fill #0

## 2024-09-14 MED ORDER — PREDNISONE 10 MG (21) PO TBPK
ORAL_TABLET | ORAL | 0 refills | Status: AC
  Filled 2024-09-14: qty 21, 6d supply, fill #0

## 2024-09-14 NOTE — Progress Notes (Signed)
 Botulinum Toxin and Filler Injection Procedure Note  Procedure: Cosmetic botulinum toxin and Filler administration  Pre-operative Diagnosis: Dynamic rhytides and volume loss  Post-operative Diagnosis: Same  Complications:  None  Brief history: The patient desires botulinum toxin injection of her forehead. I discussed with the patient this proposed procedure of botulinum toxin injections, which is customized depending on the particular needs of the patient. It is performed on facial rhytids as a temporary correction. The alternatives were discussed with the patient. The risks were addressed including bleeding, scarring, infection, damage to deeper structures, asymmetry, and chronic pain, which may occur infrequently after a procedure. The individual's choice to undergo a surgical procedure is based on the comparison of risks to potential benefits. Other risks include unsatisfactory results, brow ptosis, eyelid ptosis, allergic reaction, temporary paralysis, which should go away with time, bruising, blurring disturbances and delayed healing. Botulinum toxin injections do not arrest the aging process or produce permanent tightening of the eyelid.  Operative intervention maybe necessary to maintain the results of a blepharoplasty or botulinum toxin. The patient understands and wishes to proceed.  Procedure: The area was prepped with alcohol  and dried with a clean gauze. Using a clean technique, the botulinum toxin was diluted with 1.25 cc of preservative-free normal saline which was slowly injected with an 18 gauge needle in a tuberculin syringes.  A 32 gauge needles were then used to inject the botulinum toxin. This mixture allow for an aliquot of 4 units per 0.1 cc in each injection site.    Subsequently the mixture was injected in the glabellar and forehead area with preservation of the temporal branch to the lateral eyebrow as well as into each lateral canthal area beginning from the lateral orbital  rim medial to the zygomaticus major in 3 separate areas. A total of 38 Units of botulinum toxin was used. The forehead and glabellar area was injected with care to inject intramuscular only while holding pressure on the supratrochlear vessels in each area during each injection on either side of the medial corrugators. The injection proceeded vertically superiorly to the medial 2/3 of the frontalis muscle and superior 2/3 of the lateral frontalis, again with preservation of the frontal branch.  The nasolabial folds on each side were injected.  The left malar fat pad superiorly was injected with approximately 0.2 cc.  This was for improved symmetry per her request.  No complications were noted. Light pressure was held for 5 minutes. She was instructed explicitly in post-operative care.  Botox LOT:  [id number]  Juvederm Ultra XC LOT: [id number]

## 2024-09-20 ENCOUNTER — Other Ambulatory Visit (HOSPITAL_BASED_OUTPATIENT_CLINIC_OR_DEPARTMENT_OTHER): Payer: Self-pay

## 2024-09-20 ENCOUNTER — Other Ambulatory Visit: Payer: Self-pay

## 2024-09-21 ENCOUNTER — Other Ambulatory Visit (HOSPITAL_BASED_OUTPATIENT_CLINIC_OR_DEPARTMENT_OTHER): Payer: Self-pay

## 2024-09-21 MED ORDER — BENZONATATE 100 MG PO CAPS
100.0000 mg | ORAL_CAPSULE | Freq: Three times a day (TID) | ORAL | 1 refills | Status: AC | PRN
  Filled 2024-09-21: qty 45, 15d supply, fill #0
  Filled 2024-11-08: qty 45, 15d supply, fill #1

## 2024-09-27 ENCOUNTER — Telehealth (HOSPITAL_BASED_OUTPATIENT_CLINIC_OR_DEPARTMENT_OTHER): Payer: Self-pay | Admitting: Cardiology

## 2024-09-27 NOTE — Telephone Encounter (Signed)
  Patient is calling to follow up on heart monitor result. She said she still having issue with her heart rhythm

## 2024-09-29 ENCOUNTER — Other Ambulatory Visit (HOSPITAL_BASED_OUTPATIENT_CLINIC_OR_DEPARTMENT_OTHER): Payer: Self-pay

## 2024-09-29 DIAGNOSIS — R002 Palpitations: Secondary | ICD-10-CM | POA: Diagnosis not present

## 2024-09-29 MED ORDER — AZITHROMYCIN 250 MG PO TABS
250.0000 mg | ORAL_TABLET | Freq: Every day | ORAL | 0 refills | Status: AC
  Filled 2024-09-29: qty 6, 5d supply, fill #0

## 2024-09-29 NOTE — Telephone Encounter (Signed)
 Forwarded to Dr Lonni for review

## 2024-09-29 NOTE — Telephone Encounter (Signed)
 Dr Lonni resulted via mychart

## 2024-09-30 ENCOUNTER — Ambulatory Visit (HOSPITAL_BASED_OUTPATIENT_CLINIC_OR_DEPARTMENT_OTHER): Payer: Self-pay | Admitting: Family

## 2024-10-06 ENCOUNTER — Other Ambulatory Visit (HOSPITAL_BASED_OUTPATIENT_CLINIC_OR_DEPARTMENT_OTHER): Payer: Self-pay

## 2024-10-07 ENCOUNTER — Other Ambulatory Visit (HOSPITAL_BASED_OUTPATIENT_CLINIC_OR_DEPARTMENT_OTHER): Payer: Self-pay

## 2024-10-07 MED ORDER — CIPROFLOXACIN HCL 0.3 % OP SOLN
1.0000 [drp] | Freq: Four times a day (QID) | OPHTHALMIC | 0 refills | Status: AC
  Filled 2024-10-07: qty 5, 25d supply, fill #0

## 2024-10-08 ENCOUNTER — Other Ambulatory Visit (HOSPITAL_BASED_OUTPATIENT_CLINIC_OR_DEPARTMENT_OTHER): Payer: Self-pay

## 2024-10-16 ENCOUNTER — Other Ambulatory Visit (HOSPITAL_BASED_OUTPATIENT_CLINIC_OR_DEPARTMENT_OTHER): Payer: Self-pay

## 2024-10-18 ENCOUNTER — Other Ambulatory Visit (HOSPITAL_COMMUNITY): Payer: Self-pay

## 2024-10-18 ENCOUNTER — Other Ambulatory Visit: Payer: Self-pay

## 2024-10-18 ENCOUNTER — Other Ambulatory Visit (HOSPITAL_BASED_OUTPATIENT_CLINIC_OR_DEPARTMENT_OTHER): Payer: Self-pay

## 2024-10-19 ENCOUNTER — Other Ambulatory Visit (HOSPITAL_COMMUNITY): Payer: Self-pay

## 2024-10-20 ENCOUNTER — Other Ambulatory Visit (HOSPITAL_BASED_OUTPATIENT_CLINIC_OR_DEPARTMENT_OTHER): Payer: Self-pay

## 2024-10-20 DIAGNOSIS — R300 Dysuria: Secondary | ICD-10-CM | POA: Diagnosis not present

## 2024-10-20 DIAGNOSIS — N952 Postmenopausal atrophic vaginitis: Secondary | ICD-10-CM | POA: Diagnosis not present

## 2024-10-20 MED ORDER — NITROFURANTOIN MONOHYD MACRO 100 MG PO CAPS
100.0000 mg | ORAL_CAPSULE | Freq: Two times a day (BID) | ORAL | 0 refills | Status: AC
  Filled 2024-10-20: qty 14, 7d supply, fill #0

## 2024-11-01 DIAGNOSIS — H0288B Meibomian gland dysfunction left eye, upper and lower eyelids: Secondary | ICD-10-CM | POA: Diagnosis not present

## 2024-11-01 DIAGNOSIS — H0288A Meibomian gland dysfunction right eye, upper and lower eyelids: Secondary | ICD-10-CM | POA: Diagnosis not present

## 2024-11-01 DIAGNOSIS — H10402 Unspecified chronic conjunctivitis, left eye: Secondary | ICD-10-CM | POA: Diagnosis not present

## 2024-11-01 DIAGNOSIS — M8588 Other specified disorders of bone density and structure, other site: Secondary | ICD-10-CM | POA: Diagnosis not present

## 2024-11-01 DIAGNOSIS — H2513 Age-related nuclear cataract, bilateral: Secondary | ICD-10-CM | POA: Diagnosis not present

## 2024-11-01 DIAGNOSIS — H547 Unspecified visual loss: Secondary | ICD-10-CM | POA: Diagnosis not present

## 2024-11-08 ENCOUNTER — Other Ambulatory Visit (HOSPITAL_BASED_OUTPATIENT_CLINIC_OR_DEPARTMENT_OTHER): Payer: Self-pay

## 2024-11-08 ENCOUNTER — Other Ambulatory Visit: Payer: Self-pay

## 2024-11-08 ENCOUNTER — Ambulatory Visit (INDEPENDENT_AMBULATORY_CARE_PROVIDER_SITE_OTHER)

## 2024-11-08 ENCOUNTER — Ambulatory Visit: Admitting: Podiatry

## 2024-11-08 DIAGNOSIS — M2041 Other hammer toe(s) (acquired), right foot: Secondary | ICD-10-CM

## 2024-11-08 DIAGNOSIS — M2042 Other hammer toe(s) (acquired), left foot: Secondary | ICD-10-CM

## 2024-11-08 MED ORDER — ACETAZOLAMIDE 125 MG PO TABS
ORAL_TABLET | ORAL | 11 refills | Status: AC
  Filled 2024-11-08: qty 20, 10d supply, fill #0

## 2024-11-08 NOTE — Progress Notes (Signed)
 "   Chief Complaint  Patient presents with   Hammer Toe    Bilateral 4th toe arthritis. 0 pain. Non diabetic.   Discussed the use of AI scribe software for clinical note transcription with the patient, who gave verbal consent to proceed.  History of Present Illness Jane Gutierrez is a 68 year old female with arthritis who presents with foot pain related to shoe wear.  She experiences foot pain primarily when wearing certain shoes, especially those that are not flat. The arthritis in her feet is worsening, contributing to the discomfort when wearing these shoes. She has attempted to use orthotic devices to flatten her feet, but the issue persists.  Despite the discomfort, she wishes to continue wearing heels and other fashionable shoes. She is concerned about the appearance of her toes, noting that they are starting to cross. She reports that the fourth toes on both feet are problematic.  Her feet and hands are always cold, and she has Raynaud's phenomenon, which sometimes exacerbates the discomfort in her toes. She engages in daily Pilates, which she speculates might contribute to the flexibility of her toes. She has been using toe stretchers as a non-invasive treatment option.  X-rays have been taken. She inquires about insurance coverage for potential procedures to address her foot issues.   Past Medical History:  Diagnosis Date   Abnormally small mouth    ADD (attention deficit disorder)    ADHD    Allergy    Anxiety    pt reported she doesn't have anxiety   Arthritis    in hands   Asthma    prn inhaler   Blood transfusion without reported diagnosis    Breast cancer (HCC)    Dental crowns present    Difficulty swallowing pills    GERD (gastroesophageal reflux disease)    History of breast cancer    History of cervical cancer    History of palpitations    last echo 08/22/2014   Hypertension    Hypothyroidism    IBS (irritable bowel syndrome)    Vasculitis    Dr. Ivin  last year dx   Past Surgical History:  Procedure Laterality Date   ABDOMINAL HYSTERECTOMY     ABDOMINOPLASTY  03/28/2004   AXILLARY SENTINEL NODE BIOPSY  12/31/2012   Procedure: AXILLARY SENTINEL NODE BIOPSY;  Surgeon: Donnice Bury, MD;  Location: [facility];  Service: General;  Laterality: Bilateral;  bilateral sentinel node   BRAIN SURGERY     removal benign frontal lobe cyst in 1987   BREAST ENHANCEMENT SURGERY Bilateral ~ 2008   BREAST IMPLANT EXCHANGE Bilateral 12/11/2017   Procedure: BILATERAL IMPLANT EXCHANGE;  Surgeon: Lowery Estefana RAMAN, DO;  Location: [facility];  Service: Plastics;  Laterality: Bilateral;   BREAST RECONSTRUCTION WITH PLACEMENT OF TISSUE EXPANDER AND FLEX HD (ACELLULAR HYDRATED DERMIS)  12/31/2012   Procedure: BREAST RECONSTRUCTION WITH PLACEMENT OF TISSUE EXPANDER AND FLEX HD (ACELLULAR HYDRATED DERMIS);  Surgeon: Estefana Reichert, DO;  Location: [facility];  Service: Plastics;  Laterality: Bilateral;  bilateral immediate breast reconstruction with expanders and flex hd    CAPSULOTOMY Left 09/01/2014   Procedure: CAPSULOTOMY OF LEFT BREAST WITH LIPOFILLING FOR SYMMETRY;  Surgeon: Estefana Reichert, DO;  Location: [facility];  Service: Plastics;  Laterality: Left;   CERVICAL CERCLAGE     CHOLECYSTECTOMY N/A 06/28/2024   Procedure: LAPAROSCOPIC CHOLECYSTECTOMY;  Surgeon: Teresa Lonni HERO, MD;  Location: [facility];  Service: General;  Laterality: N/A;  Indocyanine ICG Dye   DIAGNOSTIC LAPAROSCOPY  05/12/2003   right partial salpingectomy; lysis paraovarian adhesions left   DILATION AND CURETTAGE OF UTERUS  2005   ENDOMETRIAL FULGURATION  05/12/2003   FACIAL COSMETIC SURGERY  ~ 2010   FOOT SURGERY Bilateral 07/2012   shorten toe   GYNECOLOGIC CRYOSURGERY  1998   INCISION AND DRAINAGE OF WOUND Left 04/28/2013   Procedure: IRRIGATION AND DEBRIDEMENT LEFT BREAST WOUND, POSSIBLE CLOSURE OF WOUND WITH DRAIN  PLACEMENT;  Surgeon: Estefana Reichert, DO;  Location: [facility];  Service: Plastics;  Laterality: Left;   LATISSIMUS FLAP TO BREAST Left 09/29/2013   Procedure: LATISSIMUS FLAP TO BREAST WITH TISSUE EXPANDER PLACEMENT FOR LEFT BREAST RECONSTRUCTION;  Surgeon: Estefana Reichert, DO;  Location: [facility];  Service: Plastics;  Laterality: Left;   LIPECTOMY Bilateral 03/28/2004   hip   LIPOSUCTION Bilateral 03/09/2014   Procedure: LIPOSUCTION ABDOMEN W/ LIPOFILLING LATERAL BILATERAL BREAST;  Surgeon: Estefana Reichert, DO;  Location: [facility];  Service: Plastics;  Laterality: Bilateral;  LIPOSUCTION NECK   LIPOSUCTION WITH LIPOFILLING Bilateral 09/01/2014   Procedure: LIPOSUCTION WITH LIPOFILLING;  Surgeon: Estefana Reichert, DO;  Location: [facility];  Service: Plastics;  Laterality: Bilateral;   LYSIS OF ADHESION  03/28/2004   pelvic   MASTECTOMY     PORT-A-CATH REMOVAL Right 03/09/2014   Procedure: REMOVAL PORT-A-CATH;  Surgeon: Estefana Reichert, DO;  Location: [facility];  Service: Plastics;  Laterality: Right;   PORTACATH PLACEMENT  01/27/2013   Procedure: INSERTION PORT-A-CATH;  Surgeon: Donnice Bury, MD;  Location: [facility];  Service: General;  Laterality: Right;   RADIOFREQUENCY ABLATION NERVES  05/12/2003   uterosacral nerve ablation   REMOVAL OF BILATERAL TISSUE EXPANDERS WITH PLACEMENT OF BILATERAL BREAST IMPLANTS Bilateral 03/09/2014   Procedure: REMOVAL OF BILATERAL TISSUE EXPANDERS WITH PLACEMENT OF BILATERAL BREAST IMPLANTS;  Surgeon: Estefana Reichert, DO;  Location: [facility];  Service: Plastics;  Laterality: Bilateral;   TEE WITHOUT CARDIOVERSION N/A 06/04/2013   Procedure: TRANSESOPHAGEAL ECHOCARDIOGRAM (TEE);  Surgeon: Toribio JONELLE Fuel, MD;  Location: [facility];  Service: Cardiovascular;  Laterality: N/A;   TISSUE EXPANDER PLACEMENT Left 06/16/2013   Procedure: LEFT BREAST TISSUE EXPANDER REMOVAL;   Surgeon: Estefana Reichert, DO;  Location: [facility];  Service: Plastics;  Laterality: Left;   TISSUE EXPANDER PLACEMENT Left 09/29/2013   Procedure: TISSUE EXPANDER;  Surgeon: Estefana Reichert, DO;  Location: [facility];  Service: Plastics;  Laterality: Left;   TOTAL ABDOMINAL HYSTERECTOMY W/ BILATERAL SALPINGOOPHORECTOMY  03/28/2004   TOTAL MASTECTOMY  12/31/2012   Procedure: TOTAL MASTECTOMY;  Surgeon: Donnice Bury, MD;  Location: [facility];  Service: General;  Laterality: Bilateral;  bilateral total mastectomies   VEIN SURGERY     removed from leg due to clots during pregnancy   Allergies  Allergen Reactions   Betadine [Povidone Iodine] Anaphylaxis   Doxycycline  Rash   Iodine Anaphylaxis   Shellfish Protein-Containing Drug Products Anaphylaxis   Gluten Meal     Other Reaction(s): Unknown   Omeprazole      Other Reaction(s): Unknown   Povidone-Iodine Other (See Comments)   Rosuvastatin  Calcium  Other (See Comments)    GI symptoms   Amoxicillin  Rash   Ginger Rash   Penicillins Rash   Sulfamethoxazole-Trimethoprim  Rash   Physical Exam MUSCULOSKELETAL: Toes are flexible at the respective joints. Fourth toes affected primarily.  Left fourth toe elevated due to extensor tendon.  Right 3rd  and 4th toes affected due to involvement of the tight flexor tendon.  There is no significant elevation to the right lesser toes in comparison to the left fourth toe.  These are manually reducible.  No pain on palpation to the metatarsal heads.  There is some pain on palpation to the fourth toe at the PIP joint.  No interdigital corns are noted   Results RADIOLOGY (bilateral foot, 3 weightbearing views, 11/08/2024) Foot X-ray: Medial angulation of the 3rd and 4th toes noted bilateral.  Previous Silastic implant to the left first MPJ and arthrodesis of the left second PIPJ.  Previous arthroplasty to the fifth toe noted   Assessment/Plan of Care: 1. Hammertoes of both feet     Assessment & Plan Acquired hammer toe deformities of right third and fourth toes and left fourth toe Hammer toe deformities are present in the right third and fourth toes, primarily due to extensor tendon elevation, and in the left fourth toe, primarily due to flexor tendon tightness. The deformities cause discomfort, especially when wearing certain shoes. X-rays show no significant bone abnormalities. Conservative measures, including toe stretchers, have been attempted with limited success. Percutaneous tenotomy is recommended to alleviate symptoms and improve toe alignment. The procedure involves a small incision to release the tendons, with minimal downtime and no significant activity restrictions post-procedure. Insurance coverage is expected, and the procedure is less costly than bone surgery. - Scheduled percutaneous flexor tenotomy of right third and fourth toes. - Scheduled percutaneous extensor tenotomy of left fourth toe. - Completed consent form for procedures.  Benefits, risk, and possible postoperative complications were discussed with patient today.  Also discussed possible sequela if the patient opted not to proceed with any surgical intervention.  Verbal and written consent were obtained. - Advised wearing roomiest sneakers or open-toe shoes post-procedure to encourage movement and prevent scarring. - Instructed on post-procedure care: change dressing daily with antibiotic ointment and Band-Aid, and return for suture removal in one week.     Awanda CHARM Imperial, DPM, FACFAS [facility]     [address] [place], [place] [address]                Office [phone]  Fax [phone]  "

## 2024-11-15 ENCOUNTER — Other Ambulatory Visit (HOSPITAL_BASED_OUTPATIENT_CLINIC_OR_DEPARTMENT_OTHER): Payer: Self-pay

## 2024-11-22 DIAGNOSIS — Z9889 Other specified postprocedural states: Secondary | ICD-10-CM | POA: Diagnosis not present

## 2024-11-22 DIAGNOSIS — N6459 Other signs and symptoms in breast: Secondary | ICD-10-CM | POA: Diagnosis not present

## 2024-11-30 ENCOUNTER — Other Ambulatory Visit (HOSPITAL_BASED_OUTPATIENT_CLINIC_OR_DEPARTMENT_OTHER): Payer: Self-pay

## 2024-12-01 ENCOUNTER — Encounter (HOSPITAL_BASED_OUTPATIENT_CLINIC_OR_DEPARTMENT_OTHER): Payer: Self-pay | Admitting: Cardiology

## 2024-12-01 ENCOUNTER — Other Ambulatory Visit (HOSPITAL_BASED_OUTPATIENT_CLINIC_OR_DEPARTMENT_OTHER): Payer: Self-pay

## 2024-12-01 MED ORDER — DEXTROAMPHETAMINE SULFATE 10 MG PO TABS
20.0000 mg | ORAL_TABLET | Freq: Two times a day (BID) | ORAL | 0 refills | Status: AC
  Filled 2024-12-01: qty 360, 90d supply, fill #0

## 2024-12-02 ENCOUNTER — Other Ambulatory Visit (HOSPITAL_BASED_OUTPATIENT_CLINIC_OR_DEPARTMENT_OTHER): Payer: Self-pay

## 2024-12-07 ENCOUNTER — Ambulatory Visit (INDEPENDENT_AMBULATORY_CARE_PROVIDER_SITE_OTHER): Payer: Self-pay | Admitting: Plastic Surgery

## 2024-12-07 DIAGNOSIS — Z719 Counseling, unspecified: Secondary | ICD-10-CM

## 2024-12-07 NOTE — Progress Notes (Signed)
 Botulinum Toxin Procedure Note  Procedure: Cosmetic botulinum toxin   Pre-operative Diagnosis: Dynamic rhytides   Post-operative Diagnosis: Same  Complications:  None  Brief history: The patient desires botulinum toxin injection of her forehead. I discussed with the patient this proposed procedure of botulinum toxin injections, which is customized depending on the particular needs of the patient. It is performed on facial rhytids as a temporary correction. The alternatives were discussed with the patient. The risks were addressed including bleeding, scarring, infection, damage to deeper structures, asymmetry, and chronic pain, which may occur infrequently after a procedure. The individual's choice to undergo a surgical procedure is based on the comparison of risks to potential benefits. Other risks include unsatisfactory results, brow ptosis, eyelid ptosis, allergic reaction, temporary paralysis, which should go away with time, bruising, blurring disturbances and delayed healing. Botulinum toxin injections do not arrest the aging process or produce permanent tightening of the eyelid.  Operative intervention maybe necessary to maintain the results of a blepharoplasty or botulinum toxin. The patient understands and wishes to proceed.  Procedure: The area was prepped with alcohol  and dried with a clean gauze. Using a clean technique, the botulinum toxin was diluted with 1.25 cc of preservative-free normal saline which was slowly injected with an 18 gauge needle in a tuberculin syringes.  A 32 gauge needles were then used to inject the botulinum toxin. This mixture allow for an aliquot of 4 units per 0.1 cc in each injection site.    Subsequently the mixture was injected in the glabellar and forehead area with preservation of the temporal branch to the lateral eyebrow as well as into each lateral canthal area beginning from the lateral orbital rim medial to the zygomaticus major in 3 separate areas. A  total of 40 Units of botulinum toxin was used. The forehead and glabellar area was injected with care to inject intramuscular only while holding pressure on the supratrochlear vessels in each area during each injection on either side of the medial corrugators. The injection proceeded vertically superiorly to the medial 2/3 of the frontalis muscle and superior 2/3 of the lateral frontalis, again with preservation of the frontal branch. No complications were noted. Light pressure was held for 5 minutes. She was instructed explicitly in post-operative care.  Botox LOT:  [id number]

## 2024-12-08 DIAGNOSIS — H02413 Mechanical ptosis of bilateral eyelids: Secondary | ICD-10-CM | POA: Diagnosis not present

## 2024-12-08 DIAGNOSIS — Z01818 Encounter for other preprocedural examination: Secondary | ICD-10-CM | POA: Diagnosis not present

## 2024-12-08 DIAGNOSIS — H04212 Epiphora due to excess lacrimation, left lacrimal gland: Secondary | ICD-10-CM | POA: Diagnosis not present

## 2024-12-08 DIAGNOSIS — H02422 Myogenic ptosis of left eyelid: Secondary | ICD-10-CM | POA: Diagnosis not present

## 2024-12-08 DIAGNOSIS — D485 Neoplasm of uncertain behavior of skin: Secondary | ICD-10-CM | POA: Diagnosis not present

## 2024-12-08 DIAGNOSIS — H02421 Myogenic ptosis of right eyelid: Secondary | ICD-10-CM | POA: Diagnosis not present

## 2024-12-08 DIAGNOSIS — H04123 Dry eye syndrome of bilateral lacrimal glands: Secondary | ICD-10-CM | POA: Diagnosis not present

## 2024-12-08 DIAGNOSIS — H0279 Other degenerative disorders of eyelid and periocular area: Secondary | ICD-10-CM | POA: Diagnosis not present

## 2024-12-08 DIAGNOSIS — H02411 Mechanical ptosis of right eyelid: Secondary | ICD-10-CM | POA: Diagnosis not present

## 2024-12-08 DIAGNOSIS — H53483 Generalized contraction of visual field, bilateral: Secondary | ICD-10-CM | POA: Diagnosis not present

## 2024-12-08 DIAGNOSIS — H02423 Myogenic ptosis of bilateral eyelids: Secondary | ICD-10-CM | POA: Diagnosis not present

## 2024-12-08 DIAGNOSIS — H02412 Mechanical ptosis of left eyelid: Secondary | ICD-10-CM | POA: Diagnosis not present

## 2024-12-10 ENCOUNTER — Encounter: Payer: Self-pay | Admitting: Family

## 2024-12-10 ENCOUNTER — Other Ambulatory Visit: Payer: Self-pay | Admitting: Family

## 2024-12-10 DIAGNOSIS — N63 Unspecified lump in unspecified breast: Secondary | ICD-10-CM

## 2024-12-13 ENCOUNTER — Ambulatory Visit: Admitting: Podiatry

## 2024-12-13 DIAGNOSIS — M2041 Other hammer toe(s) (acquired), right foot: Secondary | ICD-10-CM

## 2024-12-13 DIAGNOSIS — M2042 Other hammer toe(s) (acquired), left foot: Secondary | ICD-10-CM | POA: Diagnosis not present

## 2024-12-13 NOTE — Progress Notes (Signed)
 "    Chief Complaint  Patient presents with   Tenotomy    NO BETADINE< Bilateral Tenotomies, Right 3rd and 4th, left 4th toes.  Not diabetic, ASA   HPI: 68 y.o. female presents today for in office surgical correction of multiple hammertoes/toe contractures bilateral.  Past Medical History:  Diagnosis Date   Abnormally small mouth    ADD (attention deficit disorder)    ADHD    Allergy    Anxiety    pt reported she doesn't have anxiety   Arthritis    in hands   Asthma    prn inhaler   Blood transfusion without reported diagnosis    Breast cancer (HCC)    Dental crowns present    Difficulty swallowing pills    GERD (gastroesophageal reflux disease)    History of breast cancer    History of cervical cancer    History of palpitations    last echo 08/22/2014   Hypertension    Hypothyroidism    IBS (irritable bowel syndrome)    Vasculitis    Dr. Ivin last year dx   Past Surgical History:  Procedure Laterality Date   ABDOMINAL HYSTERECTOMY     ABDOMINOPLASTY  03/28/2004   AXILLARY SENTINEL NODE BIOPSY  12/31/2012   Procedure: AXILLARY SENTINEL NODE BIOPSY;  Surgeon: Donnice Bury, MD;  Location: [facility];  Service: General;  Laterality: Bilateral;  bilateral sentinel node   BRAIN SURGERY     removal benign frontal lobe cyst in 1987   BREAST ENHANCEMENT SURGERY Bilateral ~ 2008   BREAST IMPLANT EXCHANGE Bilateral 12/11/2017   Procedure: BILATERAL IMPLANT EXCHANGE;  Surgeon: Lowery Estefana RAMAN, DO;  Location: [facility];  Service: Plastics;  Laterality: Bilateral;   BREAST RECONSTRUCTION WITH PLACEMENT OF TISSUE EXPANDER AND FLEX HD (ACELLULAR HYDRATED DERMIS)  12/31/2012   Procedure: BREAST RECONSTRUCTION WITH PLACEMENT OF TISSUE EXPANDER AND FLEX HD (ACELLULAR HYDRATED DERMIS);  Surgeon: Estefana Reichert, DO;  Location: [facility];  Service: Plastics;  Laterality: Bilateral;  bilateral immediate breast reconstruction with  expanders and flex hd    CAPSULOTOMY Left 09/01/2014   Procedure: CAPSULOTOMY OF LEFT BREAST WITH LIPOFILLING FOR SYMMETRY;  Surgeon: Estefana Reichert, DO;  Location: [facility];  Service: Plastics;  Laterality: Left;   CERVICAL CERCLAGE     CHOLECYSTECTOMY N/A 06/28/2024   Procedure: LAPAROSCOPIC CHOLECYSTECTOMY;  Surgeon: Teresa Lonni HERO, MD;  Location: [facility];  Service: General;  Laterality: N/A;  Indocyanine ICG Dye   DIAGNOSTIC LAPAROSCOPY  05/12/2003   right partial salpingectomy; lysis paraovarian adhesions left   DILATION AND CURETTAGE OF UTERUS  2005   ENDOMETRIAL FULGURATION  05/12/2003   FACIAL COSMETIC SURGERY  ~ 2010   FOOT SURGERY Bilateral 07/2012   shorten toe   GYNECOLOGIC CRYOSURGERY  1998   INCISION AND DRAINAGE OF WOUND Left 04/28/2013   Procedure: IRRIGATION AND DEBRIDEMENT LEFT BREAST WOUND, POSSIBLE CLOSURE OF WOUND WITH DRAIN PLACEMENT;  Surgeon: Estefana Reichert, DO;  Location: [facility];  Service: Plastics;  Laterality: Left;   LATISSIMUS FLAP TO BREAST Left 09/29/2013   Procedure: LATISSIMUS FLAP TO BREAST WITH TISSUE EXPANDER PLACEMENT FOR LEFT BREAST RECONSTRUCTION;  Surgeon: Estefana Reichert, DO;  Location: [facility];  Service: Plastics;  Laterality: Left;   LIPECTOMY Bilateral 03/28/2004   hip   LIPOSUCTION Bilateral 03/09/2014   Procedure: LIPOSUCTION ABDOMEN W/ LIPOFILLING LATERAL BILATERAL BREAST;  Surgeon: Estefana Reichert, DO;  Location: [facility];  Service:  Plastics;  Laterality: Bilateral;  LIPOSUCTION NECK   LIPOSUCTION WITH LIPOFILLING Bilateral 09/01/2014   Procedure: LIPOSUCTION WITH LIPOFILLING;  Surgeon: Estefana Reichert, DO;  Location: [facility];  Service: Plastics;  Laterality: Bilateral;   LYSIS OF ADHESION  03/28/2004   pelvic   MASTECTOMY     PORT-A-CATH REMOVAL Right 03/09/2014   Procedure: REMOVAL PORT-A-CATH;  Surgeon: Estefana Reichert, DO;  Location: [facility];  Service:  Plastics;  Laterality: Right;   PORTACATH PLACEMENT  01/27/2013   Procedure: INSERTION PORT-A-CATH;  Surgeon: Donnice Bury, MD;  Location: [facility];  Service: General;  Laterality: Right;   RADIOFREQUENCY ABLATION NERVES  05/12/2003   uterosacral nerve ablation   REMOVAL OF BILATERAL TISSUE EXPANDERS WITH PLACEMENT OF BILATERAL BREAST IMPLANTS Bilateral 03/09/2014   Procedure: REMOVAL OF BILATERAL TISSUE EXPANDERS WITH PLACEMENT OF BILATERAL BREAST IMPLANTS;  Surgeon: Estefana Reichert, DO;  Location: [facility];  Service: Plastics;  Laterality: Bilateral;   TEE WITHOUT CARDIOVERSION N/A 06/04/2013   Procedure: TRANSESOPHAGEAL ECHOCARDIOGRAM (TEE);  Surgeon: Toribio JONELLE Fuel, MD;  Location: [facility];  Service: Cardiovascular;  Laterality: N/A;   TISSUE EXPANDER PLACEMENT Left 06/16/2013   Procedure: LEFT BREAST TISSUE EXPANDER REMOVAL;  Surgeon: Estefana Reichert, DO;  Location: [facility];  Service: Plastics;  Laterality: Left;   TISSUE EXPANDER PLACEMENT Left 09/29/2013   Procedure: TISSUE EXPANDER;  Surgeon: Estefana Reichert, DO;  [facility];  Service: Plastics;  Laterality: Left;   TOTAL ABDOMINAL HYSTERECTOMY W/ BILATERAL SALPINGOOPHORECTOMY  03/28/2004   TOTAL MASTECTOMY  12/31/2012   Procedure: TOTAL MASTECTOMY;  Surgeon: Donnice Bury, MD;  Location: [facility];  Service: General;  Laterality: Bilateral;  bilateral total mastectomies   VEIN SURGERY     removed from leg due to clots during pregnancy   Allergies[1]    Physical Exam: There are flexor contractures of the right 3rd and 4th toes and an extension contracture of the left fourth toe.  Palpable pedal pulses are noted.  The toes are manually reducible with manipulation.  Epicritic sensation intact.  Assessment/Plan of Care: 1. Hammertoe of left foot   2. Hammertoe of right foot     The consent form was reviewed and signed by the patient today.  Will  perform a percutaneous flexor tenotomy of the right 3rd and 4th toes, and percutaneous extensor tenotomy of the left fourth toe.  After the consent form was signed by the patient, the respective toes were anesthetized with a combination of 1% lidocaine  plain and 0.5% Marcaine  plain for local anesthesia.  A sterile skin scrub was performed.  Following confirmation of anesthesia, attention was then directed to the dorsal aspect of the left fourth toe.  A stab incision utilizing a #15 blade was made just lateral to the extensor tendon to the fourth toe.  The incision was carried deep to the extensor tendon and passed underneath the tendon.  The blade was turned toward the skin and the toe was plantarflexed to allow for transection of the extensor tendon.  Once this was confirmed and the toe immediately straightened somewhat, the area was then reapproximated with use of 4-0 nylon.  Antibiotic ointment and a dry sterile dressing was applied to this toe.  Attention was then directed to the plantar aspect of the right 3rd and 4th toes.  Following confirmation of anesthesia and a sterile scrub to those toes, stab incision was made utilizing a sterile #15 blade to the plantar aspect of  the 3rd and 4th toes at the PIPJ level along the central aspect.  The blade was passed deep to the flexor tendons and then turned toward the skin to allow for straightening of the toe to transect the tendons respectively.  Once this was achieved the toes immediately relaxed.  The area was then reapproximated with the use of 4-0 nylon in a simple interrupted fashion.  Antibiotic ointment and gauze dressing was applied to the toes.  Patient was given postprocedural instructions to begin tomorrow.  Will follow-up in 1 week for suture removal.  Patient tolerated the anesthesia and procedures well.   Awanda CHARM Imperial, DPM, FACFAS [facility]     [address] [place], [place]  [address]                Office [phone]  Fax [phone]    [1]  Allergies Allergen Reactions   Betadine [Povidone Iodine] Anaphylaxis   Doxycycline  Rash   Iodine Anaphylaxis   Shellfish Protein-Containing Drug Products Anaphylaxis   Gluten Meal     Other Reaction(s): Unknown   Omeprazole      Other Reaction(s): Unknown   Povidone-Iodine Other (See Comments)   Rosuvastatin  Calcium  Other (See Comments)    GI symptoms   Amoxicillin  Rash   Ginger Rash   Penicillins Rash   Sulfamethoxazole-Trimethoprim  Rash   "

## 2024-12-13 NOTE — Patient Instructions (Signed)
 Instructions: You may leave this dressing on for 24 to 48 hours. You may then remove the dressing to take a shower.  Do not leave the foot submerged under water  in any foot soaks or bath tubs. When finished bathing, pat the areas dry. Apply a small amount of Neosporin (or any antibacterial ointment you may have on hand) to each incision, and then cover with a Band-Aid. You may change this daily, or every 2 days.  If there is tenderness or swelling, you may ice and elevate the feet. Pain should be fairly limited, which is typically managed well with extra strength Tylenol  as needed.  We will see you in approximately 1 week for removal of the stitches

## 2024-12-18 ENCOUNTER — Other Ambulatory Visit (HOSPITAL_BASED_OUTPATIENT_CLINIC_OR_DEPARTMENT_OTHER): Payer: Self-pay

## 2024-12-19 ENCOUNTER — Other Ambulatory Visit (HOSPITAL_BASED_OUTPATIENT_CLINIC_OR_DEPARTMENT_OTHER): Payer: Self-pay

## 2024-12-19 MED ORDER — SYNTHROID 100 MCG PO TABS
100.0000 ug | ORAL_TABLET | Freq: Every morning | ORAL | 4 refills | Status: AC
  Filled 2024-12-19: qty 90, 90d supply, fill #0

## 2024-12-20 ENCOUNTER — Ambulatory Visit (INDEPENDENT_AMBULATORY_CARE_PROVIDER_SITE_OTHER): Admitting: Podiatry

## 2024-12-20 ENCOUNTER — Other Ambulatory Visit: Payer: Self-pay

## 2024-12-20 ENCOUNTER — Other Ambulatory Visit (HOSPITAL_BASED_OUTPATIENT_CLINIC_OR_DEPARTMENT_OTHER): Payer: Self-pay

## 2024-12-20 ENCOUNTER — Encounter: Payer: Self-pay | Admitting: Podiatry

## 2024-12-20 DIAGNOSIS — Z9889 Other specified postprocedural states: Secondary | ICD-10-CM

## 2024-12-20 MED ORDER — ALPRAZOLAM 1 MG PO TABS
1.0000 mg | ORAL_TABLET | Freq: Two times a day (BID) | ORAL | 0 refills | Status: AC | PRN
  Filled 2024-12-20: qty 15, 8d supply, fill #0

## 2024-12-20 NOTE — Progress Notes (Signed)
 "      Subjective:  Patient ID: Jane Gutierrez, female    DOB: 1956/01/04,  MRN: [id number]  Jane Gutierrez presents to clinic today for:  Chief Complaint  Patient presents with   Wound Check    POV#1 post-op recheck/suture removal. Flexor tenotomy toes 3 & 4. 1 pain. Non diabetic.    Patient is 1 week postop after undergoing a right 3rd and 4th toe percutaneous flexor tenotomy, and left fourth toe percutaneous extensor tenotomy.  Patient denies fever, chills, night sweats, nausea/vomiting.  Patient denies chest pain or shortness of breath.  Patient denies calf pain.  She notes that the bandage did come off pretty quickly when she got home the day of her procedure.  Notes no drainage and minimal pain.  Past Medical History:  Diagnosis Date   Abnormally small mouth    ADD (attention deficit disorder)    ADHD    Allergy    Anxiety    pt reported she doesn't have anxiety   Arthritis    in hands   Asthma    prn inhaler   Blood transfusion without reported diagnosis    Breast cancer (HCC)    Dental crowns present    Difficulty swallowing pills    GERD (gastroesophageal reflux disease)    History of breast cancer    History of cervical cancer    History of palpitations    last echo 08/22/2014   Hypertension    Hypothyroidism    IBS (irritable bowel syndrome)    Vasculitis    Dr. Ivin last year dx    Past Surgical History:  Procedure Laterality Date   ABDOMINAL HYSTERECTOMY     ABDOMINOPLASTY  03/28/2004   AXILLARY SENTINEL NODE BIOPSY  12/31/2012   Procedure: AXILLARY SENTINEL NODE BIOPSY;  Surgeon: Donnice Bury, MD;  Location: [facility];  Service: General;  Laterality: Bilateral;  bilateral sentinel node   BRAIN SURGERY     removal benign frontal lobe cyst in 1987   BREAST ENHANCEMENT SURGERY Bilateral ~ 2008   BREAST IMPLANT EXCHANGE Bilateral 12/11/2017   Procedure: BILATERAL IMPLANT EXCHANGE;  Surgeon: Lowery Estefana RAMAN, DO;  Location: [facility];  Service: Plastics;  Laterality: Bilateral;   BREAST RECONSTRUCTION WITH PLACEMENT OF TISSUE EXPANDER AND FLEX HD (ACELLULAR HYDRATED DERMIS)  12/31/2012   Procedure: BREAST RECONSTRUCTION WITH PLACEMENT OF TISSUE EXPANDER AND FLEX HD (ACELLULAR HYDRATED DERMIS);  Surgeon: Estefana Reichert, DO;  Location: [facility];  Service: Plastics;  Laterality: Bilateral;  bilateral immediate breast reconstruction with expanders and flex hd    CAPSULOTOMY Left 09/01/2014   Procedure: CAPSULOTOMY OF LEFT BREAST WITH LIPOFILLING FOR SYMMETRY;  Surgeon: Estefana Reichert, DO;  [facility];  Service: Plastics;  Laterality: Left;   CERVICAL CERCLAGE     CHOLECYSTECTOMY N/A 06/28/2024   Procedure: LAPAROSCOPIC CHOLECYSTECTOMY;  Surgeon: Teresa Lonni HERO, MD;  Location: [facility];  Service: General;  Laterality: N/A;  Indocyanine ICG Dye   DIAGNOSTIC LAPAROSCOPY  05/12/2003   right partial salpingectomy; lysis paraovarian adhesions left   DILATION AND CURETTAGE OF UTERUS  2005   ENDOMETRIAL FULGURATION  05/12/2003   FACIAL COSMETIC SURGERY  ~ 2010   FOOT SURGERY Bilateral 07/2012   shorten toe   GYNECOLOGIC CRYOSURGERY  1998   INCISION AND DRAINAGE OF WOUND Left 04/28/2013   Procedure: IRRIGATION AND DEBRIDEMENT LEFT BREAST WOUND, POSSIBLE CLOSURE OF WOUND WITH DRAIN PLACEMENT;  Surgeon: Estefana Reichert, DO;  Location: [facility];  Service: Plastics;  Laterality: Left;   LATISSIMUS FLAP TO BREAST Left 09/29/2013   Procedure: LATISSIMUS FLAP TO BREAST WITH TISSUE EXPANDER PLACEMENT FOR LEFT BREAST RECONSTRUCTION;  Surgeon: Estefana Reichert, DO;  [facility];  Service: Plastics;  Laterality: Left;   LIPECTOMY Bilateral 03/28/2004   hip   LIPOSUCTION Bilateral 03/09/2014   Procedure: LIPOSUCTION ABDOMEN W/ LIPOFILLING LATERAL BILATERAL BREAST;  Surgeon: Estefana Reichert, DO;  Location: [facility];  Service: Plastics;  Laterality: Bilateral;   LIPOSUCTION NECK   LIPOSUCTION WITH LIPOFILLING Bilateral 09/01/2014   Procedure: LIPOSUCTION WITH LIPOFILLING;  Surgeon: Estefana Reichert, DO;  Location: [facility];  Service: Plastics;  Laterality: Bilateral;   LYSIS OF ADHESION  03/28/2004   pelvic   MASTECTOMY     PORT-A-CATH REMOVAL Right 03/09/2014   Procedure: REMOVAL PORT-A-CATH;  Surgeon: Estefana Reichert, DO;  Location: [facility];  Service: Plastics;  Laterality: Right;   PORTACATH PLACEMENT  01/27/2013   Procedure: INSERTION PORT-A-CATH;  Surgeon: Donnice Bury, MD;  Location: [facility];  Service: General;  Laterality: Right;   RADIOFREQUENCY ABLATION NERVES  05/12/2003   uterosacral nerve ablation   REMOVAL OF BILATERAL TISSUE EXPANDERS WITH PLACEMENT OF BILATERAL BREAST IMPLANTS Bilateral 03/09/2014   Procedure: REMOVAL OF BILATERAL TISSUE EXPANDERS WITH PLACEMENT OF BILATERAL BREAST IMPLANTS;  Surgeon: Estefana Reichert, DO;  Location: [facility];  Service: Plastics;  Laterality: Bilateral;   TEE WITHOUT CARDIOVERSION N/A 06/04/2013   Procedure: TRANSESOPHAGEAL ECHOCARDIOGRAM (TEE);  Surgeon: Toribio JONELLE Fuel, MD;  Location: [facility];  Service: Cardiovascular;  Laterality: N/A;   TISSUE EXPANDER PLACEMENT Left 06/16/2013   Procedure: LEFT BREAST TISSUE EXPANDER REMOVAL;  Surgeon: Estefana Reichert, DO;  Location: [facility];  Service: Plastics;  Laterality: Left;   TISSUE EXPANDER PLACEMENT Left 09/29/2013   Procedure: TISSUE EXPANDER;  Surgeon: Estefana Reichert, DO;  Location: [facility];  Service: Plastics;  Laterality: Left;   TOTAL ABDOMINAL HYSTERECTOMY W/ BILATERAL SALPINGOOPHORECTOMY  03/28/2004   TOTAL MASTECTOMY  12/31/2012   Procedure: TOTAL MASTECTOMY;  Surgeon: Donnice Bury, MD;  Location: [facility];  Service: General;  Laterality: Bilateral;  bilateral total mastectomies   VEIN SURGERY     removed from leg due to clots during  pregnancy    Allergies[1]  Objective:  Physical Examination: There are palpable pedal pulses.  There is minimal localized edema to the surgical area.  No ecchymosis is noted.  The incisions are well-approximated and the sutures are holding well.  No active drainage is noted.  No clinical signs of infection are seen.  Assessment/Plan: 1. Post-operative state     The sutures were removed uneventfully today.  Antibiotic ointment and Band-Aids were applied.  She will continue changing these each day for the next 1 to 2 days.  At that time, she can discontinue all instructions and resume all activities as tolerated.  Return if symptoms worsen or fail to improve.   Jane Gutierrez, DPM, FACFAS [facility]     [address] [place], [place] [address]                Office [phone]  Fax [phone]    [  1]  Allergies Allergen Reactions   Betadine [Povidone Iodine] Anaphylaxis   Doxycycline  Rash   Iodine Anaphylaxis   Shellfish Protein-Containing Drug Products Anaphylaxis   Gluten Meal     Other Reaction(s): Unknown   Omeprazole      Other Reaction(s): Unknown   Povidone-Iodine Other (See Comments)   Rosuvastatin  Calcium  Other (See Comments)    GI symptoms   Amoxicillin  Rash   Ginger Rash   Penicillins Rash   Sulfamethoxazole-Trimethoprim  Rash   "

## 2024-12-21 ENCOUNTER — Other Ambulatory Visit (HOSPITAL_BASED_OUTPATIENT_CLINIC_OR_DEPARTMENT_OTHER): Payer: Self-pay

## 2024-12-21 ENCOUNTER — Ambulatory Visit: Admitting: Plastic Surgery

## 2024-12-29 ENCOUNTER — Institutional Professional Consult (permissible substitution)

## 2025-01-03 ENCOUNTER — Other Ambulatory Visit: Payer: Self-pay | Admitting: Family

## 2025-01-03 ENCOUNTER — Ambulatory Visit
Admission: RE | Admit: 2025-01-03 | Discharge: 2025-01-03 | Disposition: A | Source: Ambulatory Visit | Attending: Family | Admitting: Family

## 2025-01-03 DIAGNOSIS — N6320 Unspecified lump in the left breast, unspecified quadrant: Secondary | ICD-10-CM

## 2025-01-03 DIAGNOSIS — N63 Unspecified lump in unspecified breast: Secondary | ICD-10-CM

## 2025-01-09 ENCOUNTER — Other Ambulatory Visit (HOSPITAL_BASED_OUTPATIENT_CLINIC_OR_DEPARTMENT_OTHER): Payer: Self-pay

## 2025-01-18 ENCOUNTER — Other Ambulatory Visit (HOSPITAL_BASED_OUTPATIENT_CLINIC_OR_DEPARTMENT_OTHER): Payer: Self-pay

## 2025-01-18 ENCOUNTER — Other Ambulatory Visit: Payer: Self-pay

## 2025-01-18 MED ORDER — CIPROFLOXACIN HCL 0.3 % OP SOLN
1.0000 [drp] | Freq: Three times a day (TID) | OPHTHALMIC | 0 refills | Status: AC
  Filled 2025-01-18: qty 5, 34d supply, fill #0

## 2025-01-18 MED ORDER — RESTASIS 0.05 % OP EMUL
1.0000 [drp] | Freq: Two times a day (BID) | OPHTHALMIC | 3 refills | Status: AC
  Filled 2025-01-18: qty 180, 90d supply, fill #0

## 2025-01-20 ENCOUNTER — Other Ambulatory Visit (HOSPITAL_BASED_OUTPATIENT_CLINIC_OR_DEPARTMENT_OTHER): Payer: Self-pay

## 2025-02-11 ENCOUNTER — Ambulatory Visit (HOSPITAL_BASED_OUTPATIENT_CLINIC_OR_DEPARTMENT_OTHER): Admitting: Cardiology

## 2025-02-14 ENCOUNTER — Encounter: Payer: Medicare HMO | Admitting: Adult Health

## 2025-02-15 ENCOUNTER — Inpatient Hospital Stay: Admitting: Adult Health

## 2025-03-08 ENCOUNTER — Encounter: Admitting: Plastic Surgery

## 2025-07-07 ENCOUNTER — Other Ambulatory Visit
